# Patient Record
Sex: Male | Born: 1945 | Race: White | Hispanic: No | Marital: Married | Smoking: Former smoker
Health system: Southern US, Community
[De-identification: ages and names within clinical notes are randomized; demographics above are authoritative.]

## PROBLEM LIST (undated history)

## (undated) DIAGNOSIS — J189 Pneumonia, unspecified organism: Secondary | ICD-10-CM

## (undated) DIAGNOSIS — C3492 Malignant neoplasm of unspecified part of left bronchus or lung: Secondary | ICD-10-CM

## (undated) DIAGNOSIS — K219 Gastro-esophageal reflux disease without esophagitis: Secondary | ICD-10-CM

## (undated) DIAGNOSIS — H269 Unspecified cataract: Secondary | ICD-10-CM

## (undated) DIAGNOSIS — N189 Chronic kidney disease, unspecified: Secondary | ICD-10-CM

## (undated) DIAGNOSIS — D472 Monoclonal gammopathy: Secondary | ICD-10-CM

## (undated) DIAGNOSIS — I10 Essential (primary) hypertension: Secondary | ICD-10-CM

## (undated) DIAGNOSIS — N400 Enlarged prostate without lower urinary tract symptoms: Secondary | ICD-10-CM

## (undated) DIAGNOSIS — M51362 Other intervertebral disc degeneration, lumbar region with discogenic back pain and lower extremity pain: Secondary | ICD-10-CM

## (undated) DIAGNOSIS — D509 Iron deficiency anemia, unspecified: Secondary | ICD-10-CM

## (undated) DIAGNOSIS — J439 Emphysema, unspecified: Secondary | ICD-10-CM

## (undated) DIAGNOSIS — I739 Peripheral vascular disease, unspecified: Secondary | ICD-10-CM

## (undated) DIAGNOSIS — C3490 Malignant neoplasm of unspecified part of unspecified bronchus or lung: Secondary | ICD-10-CM

## (undated) DIAGNOSIS — Z8546 Personal history of malignant neoplasm of prostate: Secondary | ICD-10-CM

## (undated) DIAGNOSIS — C189 Malignant neoplasm of colon, unspecified: Secondary | ICD-10-CM

## (undated) DIAGNOSIS — M1990 Unspecified osteoarthritis, unspecified site: Secondary | ICD-10-CM

## (undated) DIAGNOSIS — F419 Anxiety disorder, unspecified: Secondary | ICD-10-CM

## (undated) DIAGNOSIS — R52 Pain, unspecified: Secondary | ICD-10-CM

## (undated) DIAGNOSIS — I700 Atherosclerosis of aorta: Secondary | ICD-10-CM

## (undated) DIAGNOSIS — I7121 Aneurysm of the ascending aorta, without rupture: Secondary | ICD-10-CM

## (undated) DIAGNOSIS — S22000A Wedge compression fracture of unspecified thoracic vertebra, initial encounter for closed fracture: Secondary | ICD-10-CM

## (undated) DIAGNOSIS — D126 Benign neoplasm of colon, unspecified: Secondary | ICD-10-CM

## (undated) DIAGNOSIS — I219 Acute myocardial infarction, unspecified: Secondary | ICD-10-CM

## (undated) DIAGNOSIS — I2510 Atherosclerotic heart disease of native coronary artery without angina pectoris: Secondary | ICD-10-CM

## (undated) DIAGNOSIS — E039 Hypothyroidism, unspecified: Secondary | ICD-10-CM

## (undated) DIAGNOSIS — E2740 Unspecified adrenocortical insufficiency: Secondary | ICD-10-CM

## (undated) DIAGNOSIS — F32A Depression, unspecified: Secondary | ICD-10-CM

## (undated) DIAGNOSIS — F329 Major depressive disorder, single episode, unspecified: Secondary | ICD-10-CM

## (undated) DIAGNOSIS — J449 Chronic obstructive pulmonary disease, unspecified: Secondary | ICD-10-CM

## (undated) DIAGNOSIS — R0600 Dyspnea, unspecified: Secondary | ICD-10-CM

## (undated) HISTORY — PX: BACK SURGERY: SHX140

## (undated) HISTORY — PX: KYPHOPLASTY: SHX5884

## (undated) HISTORY — PX: PORT-A-CATH REMOVAL: SHX5289

## (undated) HISTORY — PX: PROSTATE SURGERY: SHX751

## (undated) HISTORY — PX: OTHER SURGICAL HISTORY: SHX169

## (undated) HISTORY — PX: TONSILLECTOMY: SUR1361

## (undated) HISTORY — DX: Unspecified cataract: H26.9

## (undated) HISTORY — PX: CORONARY ANGIOPLASTY: SHX604

## (undated) HISTORY — PX: EYE SURGERY: SHX253

## (undated) HISTORY — PX: CATARACT EXTRACTION W/ INTRAOCULAR LENS  IMPLANT, BILATERAL: SHX1307

## (undated) HISTORY — PX: COLONOSCOPY: SHX174

---

## 2005-02-06 ENCOUNTER — Ambulatory Visit: Payer: Self-pay | Admitting: Ophthalmology

## 2006-01-01 ENCOUNTER — Ambulatory Visit: Payer: Self-pay | Admitting: Ophthalmology

## 2008-11-25 ENCOUNTER — Ambulatory Visit: Payer: Self-pay | Admitting: Unknown Physician Specialty

## 2011-03-06 ENCOUNTER — Ambulatory Visit: Payer: Self-pay | Admitting: Family Medicine

## 2011-09-27 ENCOUNTER — Inpatient Hospital Stay: Payer: Self-pay | Admitting: Internal Medicine

## 2011-09-27 LAB — COMPREHENSIVE METABOLIC PANEL
Alkaline Phosphatase: 60 U/L (ref 50–136)
Calcium, Total: 8.8 mg/dL (ref 8.5–10.1)
Chloride: 112 mmol/L — ABNORMAL HIGH (ref 98–107)
Co2: 25 mmol/L (ref 21–32)
Creatinine: 3.99 mg/dL — ABNORMAL HIGH (ref 0.60–1.30)
EGFR (African American): 17 — ABNORMAL LOW
EGFR (Non-African Amer.): 15 — ABNORMAL LOW
Osmolality: 299 (ref 275–301)
SGPT (ALT): 26 U/L
Sodium: 144 mmol/L (ref 136–145)
Total Protein: 7.8 g/dL (ref 6.4–8.2)

## 2011-09-27 LAB — URINALYSIS, COMPLETE
Bacteria: NONE SEEN
Blood: NEGATIVE
Ketone: NEGATIVE
Leukocyte Esterase: NEGATIVE
Nitrite: NEGATIVE
Ph: 5 (ref 4.5–8.0)
RBC,UR: <1 /HPF (ref 0–5)
Specific Gravity: 1.008 (ref 1.003–1.030)

## 2011-09-27 LAB — CBC
HGB: 14.2 g/dL (ref 13.0–18.0)
MCH: 30.7 pg (ref 26.0–34.0)
Platelet: 198 10*3/uL (ref 150–440)
RBC: 4.62 10*6/uL (ref 4.40–5.90)
WBC: 7.8 10*3/uL (ref 3.8–10.6)

## 2011-09-28 LAB — CREATININE, SERUM
EGFR (African American): 22 — ABNORMAL LOW
EGFR (Non-African Amer.): 19 — ABNORMAL LOW

## 2011-12-19 ENCOUNTER — Ambulatory Visit: Payer: Self-pay | Admitting: Urology

## 2011-12-26 ENCOUNTER — Ambulatory Visit: Payer: Self-pay | Admitting: Urology

## 2011-12-27 LAB — BASIC METABOLIC PANEL
Calcium, Total: 8.1 mg/dL — ABNORMAL LOW (ref 8.5–10.1)
Chloride: 113 mmol/L — ABNORMAL HIGH (ref 98–107)
Creatinine: 1.43 mg/dL — ABNORMAL HIGH (ref 0.60–1.30)
EGFR (African American): 60 — ABNORMAL LOW
EGFR (Non-African Amer.): 51 — ABNORMAL LOW
Glucose: 99 mg/dL (ref 65–99)

## 2011-12-27 LAB — PATHOLOGY REPORT

## 2011-12-27 LAB — CBC WITH DIFFERENTIAL/PLATELET
Basophil #: 0.1 10*3/uL (ref 0.0–0.1)
Basophil %: 0.9 %
Eosinophil #: 0.5 10*3/uL (ref 0.0–0.7)
HCT: 40.2 % (ref 40.0–52.0)
MCV: 93 fL (ref 80–100)
Monocyte #: 1 x10 3/mm (ref 0.2–1.0)
Neutrophil %: 65.1 %
Platelet: 171 10*3/uL (ref 150–440)
RBC: 4.34 10*6/uL — ABNORMAL LOW (ref 4.40–5.90)
RDW: 16.1 % — ABNORMAL HIGH (ref 11.5–14.5)
WBC: 11.4 10*3/uL — ABNORMAL HIGH (ref 3.8–10.6)

## 2013-05-29 DIAGNOSIS — C189 Malignant neoplasm of colon, unspecified: Secondary | ICD-10-CM

## 2013-05-29 HISTORY — DX: Malignant neoplasm of colon, unspecified: C18.9

## 2014-02-24 DIAGNOSIS — D126 Benign neoplasm of colon, unspecified: Secondary | ICD-10-CM | POA: Insufficient documentation

## 2014-04-15 ENCOUNTER — Ambulatory Visit: Payer: Self-pay | Admitting: Unknown Physician Specialty

## 2014-09-15 NOTE — Op Note (Signed)
PATIENT NAME:  Marc Gray, Marc Gray MR#:  [id number] DATE OF BIRTH:  1945/10/12  DATE OF PROCEDURE:  12/26/2011  PREOPERATIVE DIAGNOSES:  1. Benign prostatic hypertrophy.  2. Urinary retention.   POSTOPERATIVE DIAGNOSES:  1. Benign prostatic hypertrophy.  2. Urinary retention.   PROCEDURE: Transurethral resection of the prostate.   SURGEON: Edrick Oh, M.D.   ANESTHESIA: Laryngeal mask airway anesthesia.   INDICATIONS: The patient is a 69 year old gentleman who presented with large volume urinary retention and bilateral hydronephrosis with renal failure. He has failed multiple voiding trials. He has significant prostatic hypertrophy with visual obstruction. He presents for transurethral resection of the prostate.   DESCRIPTION OF PROCEDURE: After informed consent was obtained, the patient was taken to the operating room and placed in the dorsal lithotomy position under laryngeal mask airway anesthesia. The patient was then prepped and draped in the usual standard fashion. The saline resectoscope sheath was placed utilizing the visual obturator. Dilation of the urethral meatus was required, likely related to some stricture from the indwelling Foley catheter. This was calibrated to 30 [place] to allow sheath placement. No significant bleeding was encountered. Upon entering the prostatic fossa, the verumontanum was easily identified. Prominent bilobar hypertrophy was visualized with complete visual obstruction. The bladder demonstrated inflammatory changes on the bladder base and posterior bladder wall consistent with the indwelling Foley catheter. Bilateral ureteral orifices were well visualized with no lesions noted. No definitive diverticula were appreciated within the bladder. The resectoscope loop was then placed through the sheath. Resection was then begun at the level of the bladder neck, taken back to the level of the verumontanum. A large amount of lateral lobe tissue was present, including  some anterior tissue. This was resected utilizing the loop. Once the bulk of the resection was completed and small areas of bleeding were cauterized, the chips were irrigated from the bladder. These were collected and will be sent for pathology analysis. The button electrode was then utilized to facilitate additional tissue resection. Significant more lateral tissue became more evident as additional tissue was resected. This was especially near the apical region. The verumontanum was easily identified throughout the entire resection. Care was taken to avoid any resection beyond the level of the verumontanum. A significant amount of lateral lobe and anterior tissue was resected utilizing the button electrode. Several areas of bleeding were identified and cauterized utilizing the button and electrocautery. Overall no significant bleeding was encountered. The prostate fossa was noted to be open. The ureteral orifices were noted to be an adequate distance from the bladder neck and resection. A few remaining TUR chips were present within the bladder. These were irrigated free. Once the bladder was cleared, the resectoscope sheath was removed. A 22 French Foley catheter was placed utilizing a catheter guide. It was placed to gravity drainage and to continuous bladder irrigation without difficulty. The return was clear. The patient was returned to the supine position and awakened from laryngeal mask airway anesthesia. He was taken to the recovery room in stable condition. There were no problems or complications. The patient tolerated the procedure well.  Estimated blood loss was minimal.   ____________________________ Denice Bors. Jacqlyn Larsen, MD bsc:bjt D: 12/26/2011 09:59:47 ET T: 12/26/2011 12:28:33 ET JOB#: [id number]  cc: Denice Bors. Jacqlyn Larsen, MD, <Dictator> Denice Bors Keigo Whalley MD ELECTRONICALLY SIGNED 12/26/2011 13:11

## 2014-09-20 NOTE — Consult Note (Signed)
PATIENT NAME:  Marc Gray, Marc Gray MR#:  [id number] DATE OF BIRTH:  05/17/1946  DATE OF CONSULTATION:  09/28/2011  REFERRING PHYSICIAN:   CONSULTING PHYSICIAN:  Denice Bors. Jacqlyn Larsen, MD  HISTORY: Marc Gray is a 69 year old gentleman with a long-standing history of prostatic hypertrophy and obstructive-type symptoms. He states that he has had difficulty with urination for at least several years. Several weeks prior he developed episodes of nighttime incontinence consistent with overflow. He states he has just felt poorly over the past number of weeks. He presented to the emergency room yesterday. He was found to be in urinary retention with a serum creatinine of 3.99. An ultrasound demonstrated bilateral hydronephrosis as well as a large distended bladder. A Foley catheter was placed with approximately 4 liters drained from the urinary bladder. He has since had some improvement in his serum creatinine with reduction to 3.3 after catheter placement. This will likely continue to improve over the next few weeks. He was also having problems with elevated blood pressure. His blood pressure medication was recently increased. He now demonstrates significant reduction in his blood pressure after bladder decompression. Monitoring for resultant hypotension will be needed. He states he is feeling much better with the catheter drainage. The finding of 4 liters in the bladder does raise significant concern over permanent bladder damage related to over distention. He will likely have continued improvement in his renal function, although there will be some degree of permanent impairment. There is a fairly high likelihood that he may require chronic catheterization or at minimum self intermittent catheterization. He will also likely need a TURP. Further evaluation with cystoscopy to determine if a bladder diverticulum or other findings were present has also been discussed with the patient. He understands the situation. He  denies any known family history of prostate cancer. He denies any known family history of significant prostatic hypertrophy or surgical intervention. He denies any other significant prior urological history.   PAST MEDICAL HISTORY:  1. Recent onset hypertension. 2. Prostatic hypertrophy, as stated above.   PAST SURGICAL HISTORY: Noncontributory.   ALLERGIES: No known drug allergies.   MEDICATIONS ON ADMISSION: None.   PHYSICAL EXAMINATION:   VITAL SIGNS: Blood pressure 119/77, heart rate 66, respiratory rate 18, and temperature 98.5.   HEENT: Examination is within normal limits.   CHEST: Clear to auscultation bilaterally.   CARDIOVASCULAR: Regular rate and rhythm.   ABDOMEN: Soft, nontender, and nondistended. Bladder nonpalpable. No appreciable CVA tenderness.   GU: External genitalia within normal limits Foley catheter in place draining blood-tinged urine.   EXTREMITIES: Free range of motion x4.   NEUROLOGIC: Motor and sensory grossly intact.   ASSESSMENT:  1. Urinary retention with acute renal failure.  2. Benign prostatic hypertrophy.  3. Bilateral hydronephrosis.   RECOMMENDATIONS: The patient will need a Foley catheter for a minimum of 10 to 14 days. He should be started on Flomax 0.4 mg with breakfast daily. He should follow up in the office in 10 to 14 days for catheter removal and voiding trial. He will need a subsequent cystoscopy for further evaluation of the bladder for prostate and possible bladder diverticulum. As long as there is continued improvement in renal function status, follow-up upper tract studies is not absolutely          indicated. He will have some blood in the urine related to the overdistention. Avoid aspirin and other blood thinners at this time. If there are any further questions, please free to contact us.  ____________________________ Aaron Edelman  Bjorn Loser, MD bsc:slb D: 09/28/2011 07:51:57 ET T: 09/28/2011 11:20:39  ET JOB#: [id number]  cc: Denice Bors. Jacqlyn Larsen, MD, <Dictator> Denice Bors Zeus Marquis MD ELECTRONICALLY SIGNED 10/09/2011 8:15

## 2014-09-20 NOTE — Discharge Summary (Signed)
PATIENT NAME:  Marc Gray, EICHER MR#:  [id number] DATE OF BIRTH:  05-09-1946  DATE OF ADMISSION:  09/27/2011 DATE OF DISCHARGE:  09/28/2011  DISCHARGE DIAGNOSES:  1. Acute on chronic kidney disease with unknown baseline creatinine, likely due to long-standing urinary retention, now relieved status post Foley catheterization, creatinine improving status post catheter.  2. Urinary retention likely due to benign prostatic hypertrophy, now has an indwelling Foley which he will need for two weeks and then outpatient urologic evaluation.  3. Benign prostatic hypertrophy, on Flomax.  4. Malignant hypertension, now resolved, likely due to urinary retention, stopped all blood pressure medication now.   SECONDARY DIAGNOSIS: Hypertension.   CONSULTANT: Urology, Dr. Edrick Oh.   LABORATORY, DIAGNOSTIC AND RADIOLOGICAL DATA: Bilateral kidney ultrasound on 05/01 showed moderate bilateral hydronephrosis. Urinalysis on admission was negative.   HISTORY AND SHORT HOSPITAL COURSE: Patient is a 69 year old male with a known history of hypertension was admitted for acute renal failure thought to be postobstructive in nature due to underlying benign prostatic hypertrophy and long-standing urinary retention. Patient's creatinine on the day of admission was 3.99. Please see Dr. Trena Platt dictated history and physical for further details. Foley catheter was inserted and patient drained more than 4 liters of urine while right there while in the Emergency Department and was continued to have good urine output via Foley. His creatinine also started coming down with a value of 3.3 while on the next day urological consultation was obtained with Dr. Edrick Oh who agreed with the management. Recommended Flomax for treatment of benign prostatic hypertrophy. Patient was discharged home in stable condition as his renal function was getting better and was expected to get better with Foley indwelling for now. He was discharged home  in stable condition.   PHYSICAL EXAMINATION: VITAL SIGNS: On the date of discharge his vital signs are as follows: Temperature 97.3, heart rate 62 per minute, respirations 20 per minute, blood pressure 127/74 mmHg. He was saturating 94% on room air. Pertinent Physical Examination: CARDIOVASCULAR: S1, S2 normal. No murmurs, rubs, or gallop. LUNGS: Clear to auscultation bilaterally. No wheezes, rales, rhonchi, crepitation. ABDOMEN: Soft, benign. NEUROLOGIC: Nonfocal examination. All other physical examination remained at the baseline.   DISCHARGE MEDICATIONS: Flomax 0.4 mg p.o. daily.   DISCHARGE DIET: Low sodium.   DISCHARGE ACTIVITY: As tolerated.   DISCHARGE INSTRUCTIONS AND FOLLOW UP:  1. Patient was instructed to follow up with his primary care physician, Dr. [name], in 1 to 2 weeks.  2. He will need follow up with Dr. Edrick Oh as scheduled on 05/15 at 9:00 a.m. for voiding trial. He was instructed to keep the Foley indwelling for now until he sees Dr. Edrick Oh as an outpatient. He was set up to get home health nursing for Foley care and other needs.  3. He will need basic metabolic panel check in one week with results forwarded to primary care physician for renal function evaluation considering his renal failure which is expected to improve with indwelling Foley.   TOTAL TIME DISCHARGING THIS PATIENT: 45 minutes.   ____________________________ Lucina Mellow. Manuella Ghazi, MD vss:cms D: 09/29/2011 09:51:40 ET T: 09/29/2011 13:43:22 ET JOB#: [id number]  cc: Justan Gaede S. Manuella Ghazi, MD, <Dictator> Irven Easterly. Kary Kos, MD Denice Bors. Jacqlyn Larsen, MD Remer Macho MD ELECTRONICALLY SIGNED 10/01/2011 13:20

## 2014-09-20 NOTE — Consult Note (Signed)
Patient Seen, Chart Review, Films Reviewed, Note Dictated urinary retention,  acute renal failure, benign prostatic hypertrophy The findings are consistent with long-standing urinary retention.  The bladder volume of 4 L raises significant concern over the degree of permanent damage in regards to bladder function.  He will need the Foley catheter for a minimum of 10-14 days.  He will need to be on Flomax 0.4 mg with breakfast.  He should followup in the office in 10-14 days for catheter removal and voiding trial.  There is already a trend down in his serum creatinine.  There will likely be some degree of permanent renal impairment as a result of long-standing obstruction.  There is a fairly significant chance that he will be an permanent urinary retention.  If there is no evidence of bladder diverticulum, he will likely need a TURP.  There is still a significant chance that he may not void adequately spontaneously even after a TURP.  He may need chronic Foley catheter or at minimum self intermittent catheterization.  This has been discussed in detail with the patient.  No further urological intervention is indicated at present.  He may need less or possibly no blood pressure medication now that the obstruction is relieved.  He does have some risk of resultant hypotension.  Please contact us if there are any further questions.  Electronic Signatures: Murrell Redden (MD)  (Signed on 02-May-13 07:44)  Authored  Last Updated: 02-May-13 07:44 by Murrell Redden (MD)

## 2014-09-20 NOTE — H&P (Signed)
PATIENT NAME:  Marc Gray, Marc Gray MR#:  [id number] DATE OF BIRTH:  1945/07/31  DATE OF ADMISSION:  09/27/2011  PRIMARY CARE PHYSICIAN:  Dr. Kary Kos.  REQUESTING PHYSICIAN: Dr. Renard Hamper.   CHIEF COMPLAINT: Elevated kidney function.   HISTORY OF PRESENT ILLNESS: The patient is a 69 year old male with a known history of hypertension who is being admitted for acute renal failure. The patient has been having bilateral flank pain for the last week or so. He has been taking ibuprofen at least 4 to 5 tablets daily. He also had some increased kidney function since January of this year which is being followed by his primary care physician. He had a repeat evaluation yesterday as a regular follow-up and his primary care physician's office called him to come to the Emergency Department. His creatinine was found to be 3.6. The patient reports urge and not able to urinate for the last couple of months until last 2 to 3 weeks when he started having a lot more peeing with at times good flow. He does report having some urinary difficulties since he was cleared. He denies any other symptoms at this time. He also reports having increased blood pressure since January and his primary care physician is trying to get it under control with different medication.   PAST MEDICAL HISTORY: Hypertension.   MEDICATIONS AT HOME:  1. Amlodipine 5 mg p.o. daily.  2. Ibuprofen 4 to 5 tablets daily for the last week and was taking intermittently before that.  3. Aspirin 325 mg daily.   ALLERGIES: No known drug allergies.   SOCIAL HISTORY: Smokes 1-1/2 to two packs of cigarettes daily for the last 50 years. Occasional alcohol. He works as a Publishing rights manager.   FAMILY HISTORY: Father with diabetes. Mother with diet-controlled diabetes.   REVIEW OF SYSTEMS: CONSTITUTIONAL: No fever, fatigue, or weakness. EYES: No blurred or double vision. ENT: No tinnitus or ear pain. RESPIRATORY: No cough, wheezing, or hemoptysis. CARDIOVASCULAR: No chest  pain, orthopnea or edema. GASTROINTESTINAL: No nausea, vomiting, or diarrhea. GENITOURINARY: Positive for urinary urgency and frequency at times. ENDOCRINE: No polyuria or nocturia. HEMATOLOGY: No anemia or bruising. SKIN: No rash or lesion. MUSCULOSKELETAL: No arthritis or muscle cramps. He does get some left lower extremity edema which he claims is due to valve dysfunction in the lower extremity on the left side.  NEUROLOGIC: No tingling or numbness. Positive for headache. PSYCHIATRIC: No history of anxiety or depression.   PHYSICAL EXAMINATION:  VITAL SIGNS: Temperature 95.6, heart rate 65 per minute, respirations 20 per minute, blood pressure of 208/103. He was saturating 98% on room air.   GENERAL: The patient is a 69 year old male lying in the bed comfortably without any acute distress.   EYES: Pupils equal, round, reactive to light and accommodation. No scleral icterus. Extraocular muscles intact.   HEENT: Head atraumatic, normocephalic. Oropharynx and nasopharynx clear.   NECK: Supple. No jugular venous distention. No thyroid enlargement or tenderness.   LUNGS: Clear to auscultation bilaterally. No wheezes, rales, rhonchi, or crepitation.   HEART: S1, S2. No murmurs, rubs, or gallop.   ABDOMEN: Soft, nontender, nondistended. Bowel sounds present. No organomegaly or mass.   EXTREMITIES: No pedal edema on the right lower extremity. 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    NEUROLOGIC: Nonfocal examination. Cranial nerves II through XII intact. Muscle strength 5/5 in extremities. Sensation  intact.  PSYCHIATRIC: The patient is oriented to time, place, and person x3.   SKIN: No obvious rash, lesion, or ulcer.   GENITOURINARY: The patient has a Foley catheter in place now.   LABORATORY, RADIOLOGICAL AND DIAGNOSTIC DATA: Normal CBC. Normal liver function tests.  Normal BMP except BUN of 47, creatinine 2.99, chloride of 112. Negative urinalysis. Bilateral kidney ultrasound while in the Emergency Room showed moderate bilateral hydronephrosis. His labs from Dr. Barbarann Ehlers office from yesterday showed creatinine of 3.6.   IMPRESSION AND PLAN:  1. Acute renal failure, likely postobstructive in nature due to underlying benign prostatic hyperplasia, now has Foley catheterization indwelling and has already drained more than 4 liters while in the Emergency Department. I discussed the case with urologist, Dr. Jacqlyn Larsen who will see the patient in the morning. Will obtain consult at this time. The patient was also using nonsterile anti-inflammatory including aspirin and ibuprofen. We will stop that. This could be making his kidney functions worse.  2. Accelerated hypertension likely due to ongoing urinary retention. Now blood pressure is improving status post Foley catheterization and draining urine. Will start Norvasc at a higher dose to get blood pressure under better control.  3. Bilateral hydronephrosis, likely due to benign prostatic hypertrophy. We will consult urology. Already has indwelling Foley. This should improve.  4. Tobacco abuse. The patient was counseled for about three minutes. He is agreeable with nicotine patch while in the hospital. We will order that and provide smoking cessation material.  5. CODE STATUS: FULL CODE.   The patient stated that he already has an appointment in the coming two weeks with one of the outpatient neurology that his primary care physician asked him to do.   TOTAL TIME TAKING CARE OF THIS PATIENT: 55 minutes.    ____________________________ Lucina Mellow. Manuella Ghazi, MD vss:ap D: 09/27/2011 20:07:09 ET T: 09/28/2011 09:46:44 ET JOB#: [id number]  cc: Arrie Zuercher S. Manuella Ghazi, MD, <Dictator> Irven Easterly. Kary Kos, MD Denice Bors. Jacqlyn Larsen, MD Remer Macho MD ELECTRONICALLY SIGNED 09/29/2011 12:31

## 2014-09-21 LAB — SURGICAL PATHOLOGY

## 2016-12-01 DIAGNOSIS — Z91038 Other insect allergy status: Secondary | ICD-10-CM | POA: Diagnosis not present

## 2016-12-01 DIAGNOSIS — R220 Localized swelling, mass and lump, head: Secondary | ICD-10-CM | POA: Diagnosis not present

## 2016-12-13 DIAGNOSIS — R252 Cramp and spasm: Secondary | ICD-10-CM | POA: Diagnosis not present

## 2016-12-13 DIAGNOSIS — R5381 Other malaise: Secondary | ICD-10-CM | POA: Diagnosis not present

## 2016-12-13 DIAGNOSIS — R590 Localized enlarged lymph nodes: Secondary | ICD-10-CM | POA: Diagnosis not present

## 2016-12-13 DIAGNOSIS — Z23 Encounter for immunization: Secondary | ICD-10-CM | POA: Diagnosis not present

## 2016-12-13 DIAGNOSIS — R5383 Other fatigue: Secondary | ICD-10-CM | POA: Diagnosis not present

## 2016-12-13 DIAGNOSIS — Z125 Encounter for screening for malignant neoplasm of prostate: Secondary | ICD-10-CM | POA: Diagnosis not present

## 2016-12-13 DIAGNOSIS — Z0000 Encounter for general adult medical examination without abnormal findings: Secondary | ICD-10-CM | POA: Diagnosis not present

## 2016-12-13 DIAGNOSIS — Z720 Tobacco use: Secondary | ICD-10-CM | POA: Diagnosis not present

## 2016-12-13 DIAGNOSIS — R0789 Other chest pain: Secondary | ICD-10-CM | POA: Diagnosis not present

## 2016-12-15 ENCOUNTER — Other Ambulatory Visit: Payer: Self-pay | Admitting: Family Medicine

## 2016-12-15 DIAGNOSIS — R221 Localized swelling, mass and lump, neck: Secondary | ICD-10-CM

## 2017-01-03 ENCOUNTER — Ambulatory Visit: Admission: RE | Admit: 2017-01-03 | Payer: PPO | Source: Ambulatory Visit

## 2017-01-17 ENCOUNTER — Ambulatory Visit: Admission: RE | Admit: 2017-01-17 | Payer: PPO | Source: Ambulatory Visit

## 2017-01-24 ENCOUNTER — Ambulatory Visit
Admission: RE | Admit: 2017-01-24 | Discharge: 2017-01-24 | Disposition: A | Discharge status: Home / Self Care | Payer: PPO | Source: Ambulatory Visit | Attending: Family Medicine | Admitting: Family Medicine

## 2017-01-24 DIAGNOSIS — J439 Emphysema, unspecified: Secondary | ICD-10-CM | POA: Diagnosis not present

## 2017-01-24 DIAGNOSIS — I700 Atherosclerosis of aorta: Secondary | ICD-10-CM | POA: Diagnosis not present

## 2017-01-24 DIAGNOSIS — R221 Localized swelling, mass and lump, neck: Secondary | ICD-10-CM | POA: Insufficient documentation

## 2017-01-24 HISTORY — DX: Malignant neoplasm of colon, unspecified: C18.9

## 2017-01-24 MED ORDER — IOPAMIDOL (ISOVUE-300) INJECTION 61%
75.0000 mL | Freq: Once | INTRAVENOUS | Status: AC | PRN
  Administered 2017-01-24: 75 mL via INTRAVENOUS

## 2017-02-02 ENCOUNTER — Inpatient Hospital Stay: Payer: PPO | Attending: Oncology | Admitting: Oncology

## 2017-02-02 ENCOUNTER — Encounter: Payer: Self-pay | Admitting: Oncology

## 2017-02-02 ENCOUNTER — Inpatient Hospital Stay: Payer: PPO

## 2017-02-02 VITALS — BP 126/76 | HR 66 | Temp 97.5°F | Resp 18 | Ht 72.84 in | Wt 175.2 lb

## 2017-02-02 DIAGNOSIS — Z7982 Long term (current) use of aspirin: Secondary | ICD-10-CM

## 2017-02-02 DIAGNOSIS — R918 Other nonspecific abnormal finding of lung field: Secondary | ICD-10-CM | POA: Diagnosis not present

## 2017-02-02 DIAGNOSIS — R221 Localized swelling, mass and lump, neck: Secondary | ICD-10-CM | POA: Diagnosis not present

## 2017-02-02 DIAGNOSIS — Z85038 Personal history of other malignant neoplasm of large intestine: Secondary | ICD-10-CM | POA: Diagnosis not present

## 2017-02-02 DIAGNOSIS — I700 Atherosclerosis of aorta: Secondary | ICD-10-CM | POA: Diagnosis not present

## 2017-02-02 DIAGNOSIS — I6529 Occlusion and stenosis of unspecified carotid artery: Secondary | ICD-10-CM | POA: Insufficient documentation

## 2017-02-02 DIAGNOSIS — R590 Localized enlarged lymph nodes: Secondary | ICD-10-CM | POA: Diagnosis not present

## 2017-02-02 DIAGNOSIS — C770 Secondary and unspecified malignant neoplasm of lymph nodes of head, face and neck: Secondary | ICD-10-CM | POA: Insufficient documentation

## 2017-02-02 DIAGNOSIS — C3492 Malignant neoplasm of unspecified part of left bronchus or lung: Secondary | ICD-10-CM | POA: Diagnosis not present

## 2017-02-02 DIAGNOSIS — J432 Centrilobular emphysema: Secondary | ICD-10-CM | POA: Diagnosis not present

## 2017-02-02 DIAGNOSIS — Z79899 Other long term (current) drug therapy: Secondary | ICD-10-CM | POA: Insufficient documentation

## 2017-02-02 DIAGNOSIS — R0789 Other chest pain: Secondary | ICD-10-CM | POA: Insufficient documentation

## 2017-02-02 DIAGNOSIS — R05 Cough: Secondary | ICD-10-CM | POA: Diagnosis not present

## 2017-02-02 DIAGNOSIS — N400 Enlarged prostate without lower urinary tract symptoms: Secondary | ICD-10-CM | POA: Insufficient documentation

## 2017-02-02 DIAGNOSIS — Z716 Tobacco abuse counseling: Secondary | ICD-10-CM

## 2017-02-02 DIAGNOSIS — F17210 Nicotine dependence, cigarettes, uncomplicated: Secondary | ICD-10-CM | POA: Diagnosis not present

## 2017-02-02 DIAGNOSIS — Z803 Family history of malignant neoplasm of breast: Secondary | ICD-10-CM | POA: Insufficient documentation

## 2017-02-02 LAB — CBC WITH DIFFERENTIAL/PLATELET
Basophils Absolute: 0.2 10*3/uL — ABNORMAL HIGH (ref 0–0.1)
Basophils Relative: 2 %
Eosinophils Absolute: 0.6 10*3/uL (ref 0–0.7)
Eosinophils Relative: 6 %
HEMATOCRIT: 41.5 % (ref 40.0–52.0)
HEMOGLOBIN: 14.3 g/dL (ref 13.0–18.0)
LYMPHS ABS: 1.1 10*3/uL (ref 1.0–3.6)
LYMPHS PCT: 12 %
MCH: 31.5 pg (ref 26.0–34.0)
MCHC: 34.3 g/dL (ref 32.0–36.0)
MCV: 91.8 fL (ref 80.0–100.0)
MONO ABS: 1 10*3/uL (ref 0.2–1.0)
MONOS PCT: 11 %
NEUTROS ABS: 6 10*3/uL (ref 1.4–6.5)
Neutrophils Relative %: 69 %
Platelets: 220 10*3/uL (ref 150–440)
RBC: 4.53 MIL/uL (ref 4.40–5.90)
RDW: 14.7 % — AB (ref 11.5–14.5)
WBC: 8.8 10*3/uL (ref 3.8–10.6)

## 2017-02-02 LAB — COMPREHENSIVE METABOLIC PANEL
ALK PHOS: 55 U/L (ref 38–126)
ALT: 19 U/L (ref 17–63)
ANION GAP: 9 (ref 5–15)
AST: 17 U/L (ref 15–41)
Albumin: 3.7 g/dL (ref 3.5–5.0)
BILIRUBIN TOTAL: 0.8 mg/dL (ref 0.3–1.2)
BUN: 22 mg/dL — ABNORMAL HIGH (ref 6–20)
CALCIUM: 9.1 mg/dL (ref 8.9–10.3)
CO2: 23 mmol/L (ref 22–32)
Chloride: 103 mmol/L (ref 101–111)
Creatinine, Ser: 1.34 mg/dL — ABNORMAL HIGH (ref 0.61–1.24)
GFR calc Af Amer: >60 mL/min (ref >60)
GFR, EST NON AFRICAN AMERICAN: 52 mL/min — AB (ref >60)
Glucose, Bld: 102 mg/dL — ABNORMAL HIGH (ref 65–99)
POTASSIUM: 4.3 mmol/L (ref 3.5–5.1)
Sodium: 135 mmol/L (ref 135–145)
TOTAL PROTEIN: 7.2 g/dL (ref 6.5–8.1)

## 2017-02-02 LAB — TSH: TSH: 3.455 u[IU]/mL (ref 0.350–4.500)

## 2017-02-02 NOTE — Progress Notes (Addendum)
Hematology/Oncology Consult note [facility] [phone] [phone]  CONSULT NOTE Patient Care Team: [name], MD as PCP - General (Family Medicine)  CHIEF COMPLAINTS/PURPOSE OF CONSULTATION:     HISTORY OF PRESENTING ILLNESS:  Marc Gray 71 y.o.  male who was referred by Dr.Hedrick Jeneen Rinks to see me for evaluation of neck mass.  Patient has felt a neck mass for about 7-8 months and lately also atypical chest pain and he went to see primary care physician Dr.Hedrick. CT neck was done which showed multiple cervical/supraclavical lymphadenopathy as well as left upper lobe mass.   Patient has 53 pack year smoking history and currently daily smoker    ROS:  Review of Systems  Constitutional: Negative.   HENT:  Negative.   Eyes: Negative.   Respiratory: Positive for cough.   Cardiovascular: Positive for chest pain.  Gastrointestinal: Negative.   Endocrine: Negative.   Genitourinary: Negative.    Musculoskeletal: Negative.   Skin: Negative.   Neurological: Negative.   Hematological: Negative.   Psychiatric/Behavioral: Negative.     MEDICAL HISTORY:  Past Medical History:  Diagnosis Date  . Cataract   . Colon cancer ([place]) 2015   Pt states during his colonoscopy he had cancerous polyps removed.     SURGICAL HISTORY: No past surgical history on file.  SOCIAL HISTORY: Social History   Social History  . Marital status: Married    Spouse name: N/A  . Number of children: N/A  . Years of education: N/A   Occupational History  . Not on file.   Social History Main Topics  . Smoking status: Current Some Day Smoker    Packs/day: 1.00    Years: 53.00    Types: Cigarettes  . Smokeless tobacco: Never Used  . Alcohol use Not on file  . Drug use: Unknown  . Sexual activity: Not on file   Other Topics Concern  . Not on file   Social History Narrative  . No narrative on file    FAMILY HISTORY: Family  History  Problem Relation Age of Onset  . Breast cancer Maternal Grandmother     ALLERGIES:  has No Known Allergies.  MEDICATIONS:  Current Outpatient Prescriptions  Medication Sig Dispense Refill  . aspirin 325 MG tablet Take 325 mg by mouth daily.    . Chlorpheniramine-Phenylephrine (SINUS & ALLERGY PO) Take by mouth daily as needed.    . Multiple Vitamin (MULTI-VITAMINS) TABS Take by mouth.     No current facility-administered medications for this visit.       [name]  PHYSICAL EXAMINATION: ECOG PERFORMANCE STATUS: 0 - Asymptomatic Vitals:   02/02/17 1505  BP: 126/76  Pulse: 66  Resp: 18  Temp: (!) 97.5 F (36.4 C)   Filed Weights   02/02/17 1505  Weight: 175 lb 3.2 oz (79.5 kg)    GENERAL: Well-nourished well-developed; Alert, no distress and comfortable.  EYES: no pallor or icterus OROPHARYNX: no thrush or ulceration; good dentition  NECK: supple, no masses felt LYMPH: left supraclavicular lymph adenopathy, multiple other cervical lymph nodes.  LUNGS: clear to auscultation and  No wheeze or crackles HEART/CVS: regular rate & rhythm and no murmurs; No lower extremity edema ABDOMEN: abdomen soft, non-tender and normal bowel sounds Musculoskeletal:no cyanosis of digits and no clubbing  PSYCH: alert & oriented x 3  NEURO: no focal motor/sensory deficits SKIN:  no rashes or significant lesions  LABORATORY DATA:  I have reviewed the data as listed Lab Results  Component  Value Date   WBC 11.4 (H) 12/27/2011   HGB 13.8 12/27/2011   HCT 40.2 12/27/2011   MCV 93 12/27/2011   PLT 171 12/27/2011   No results for input(s): NA, K, CL, CO2, GLUCOSE, BUN, CREATININE, CALCIUM, GFRNONAA, GFRAA, PROT, ALBUMIN, AST, ALT, ALKPHOS, BILITOT, BILIDIR, IBILI in the last 8760 hours.  RADIOGRAPHIC STUDIES: I have personally reviewed the radiological images as listed and agreed with the findings in the report. Ct Soft Tissue Neck W Contrast  Result Date: 01/24/2017 CLINICAL DATA:   Left supraclavicular mass for 9 months. EXAM: CT NECK WITH CONTRAST TECHNIQUE: Multidetector CT imaging of the neck was performed using the standard protocol following the bolus administration of intravenous contrast. CONTRAST:  25mL ISOVUE-300 IOPAMIDOL (ISOVUE-300) INJECTION 61% COMPARISON:  None. FINDINGS: Pharynx and larynx: Symmetric pharyngeal soft tissues without evidence of mass. No fluid collection or significant inflammatory changes in the parapharyngeal or retropharyngeal spaces. Unremarkable larynx. Salivary glands: No inflammation, mass, or stone. Thyroid: Unremarkable. Lymph nodes: Multiple enlarged, rounded left supraclavicular lymph nodes measure up to 12 mm in short axis. An abnormal left level V lymph node more superiorly in the left lateral neck measures 9 mm. An 8 mm short axis left level III lymph node has an abnormal rounded configuration. An enlarged left level III lymph node more inferiorly measures 13 mm, while left level IV lymph nodes measure 10 mm. There is a 10 mm lymph node in the right tracheoesophageal groove near the thoracic inlet. A low right level III lymph node measures 8 mm, and there is a 6 mm right level III lymph node more superiorly. Vascular: Major vascular structures of the neck are patent. Mild right greater than left cervical carotid artery atherosclerosis. Mild thoracic aortic atherosclerosis. Limited intracranial: Unremarkable. Visualized orbits: Bilateral cataract extraction. Mastoids and visualized paranasal sinuses: Partially visualized right frontal sinus opacification. Mild right ethmoid air cell mucosal thickening. Clear mastoid air cells. Cerumen in the left EAC. Skeleton: No suspicious lytic or blastic osseous lesion. Moderate lower cervical disc degeneration, greatest at C6-7. Upper chest: Multiple enlarged mediastinal lymph nodes including a 2.1 cm low right paratracheal node and a 1.5 cm prevascular node. Large left upper lobe mass extending to the pleural  surface in the anterior/apical left upper lobe. The mass measures greater than 10 cm in craniocaudal length. The mass extends inferiorly to the left hilum where there is soft tissue encasing the hilar vessels as well as extending posteriorly into the superior segment of the left lower lobe. The hilar component was incompletely imaged. There is centrilobular emphysema. Other: 1.5 cm subcutaneous low-density lesion in the posterior midline of the lower neck, likely a sebaceous cyst. IMPRESSION: 1. Partially visualized large left upper lobe lung mass with mediastinal and left greater than right lower neck/supraclavicular lymphadenopathy consistent with metastatic lung cancer. 2. Aortic Atherosclerosis (ICD10-I70.0) and Emphysema (ICD10-J43.9). These results will be called to the ordering clinician or representative by the Radiologist Assistant, and communication documented in the PACS or zVision Dashboard. Electronically Signed   By: Logan Bores M.D.   On: 01/24/2017 11:06    ASSESSMENT & PLAN:  1. Lung mass   2. Cervical lymphadenopathy   3. Tobacco abuse counseling    I had a lengthy discussion with patient about image finding which is very concerning for primary lung cancer  Will obtain PET scan, MRI brain.  Refer to surgery for left supraclavicular lymph node biopsy.  Surgery evaluation for biopsy, possible core needle biopsy of left supraclavicular node. #  smoke cessation is discussed with patient in detail.  Will discuss his case on tumor board  Patient will be out of town next week and prefer to proceed work up after he returns.   All questions were answered. The patient knows to call the clinic with any problems questions or concerns.  Return of visit:  Thank you for this kind referral and the opportunity to participate in the care of this patient. A copy of today's note is routed to referring provider Dr.Hedrick.    [name], MD, PhD Hematology Oncology [facility] Pager- [phone] 02/02/2017

## 2017-02-02 NOTE — Progress Notes (Signed)
Patient say his PCP for left side neck nodules and left side chest/back pain.  The left side pain is in episodes and can start in the back that radiates to the chest or start in the chest that radiates to the back.  His PCP ordered a CT and it did show an abnormality.

## 2017-02-03 ENCOUNTER — Encounter: Payer: Self-pay | Admitting: Oncology

## 2017-02-05 NOTE — Addendum Note (Signed)
Addended by: [name] on: 02/05/2017 08:14 PM   Modules accepted: Orders

## 2017-02-07 ENCOUNTER — Encounter: Payer: Self-pay | Admitting: *Deleted

## 2017-02-08 ENCOUNTER — Ambulatory Visit (INDEPENDENT_AMBULATORY_CARE_PROVIDER_SITE_OTHER): Payer: PPO | Admitting: General Surgery

## 2017-02-08 ENCOUNTER — Encounter: Payer: Self-pay | Admitting: General Surgery

## 2017-02-08 ENCOUNTER — Inpatient Hospital Stay: Payer: Self-pay

## 2017-02-08 VITALS — BP 130/80 | HR 68 | Resp 18 | Ht 72.0 in | Wt 175.0 lb

## 2017-02-08 DIAGNOSIS — R221 Localized swelling, mass and lump, neck: Secondary | ICD-10-CM

## 2017-02-08 NOTE — Progress Notes (Signed)
Patient ID: Marc Gray, male   DOB: 1945-06-07, 71 y.o.   MRN: [id number]  Chief Complaint  Patient presents with  . Other    HPI Marc Gray is a 71 y.o. male  Here today for a evaluation of a neck node and lung mass.  Patient had a ct scan don eon 01/24/2017. Wife, Marc Gray is present.  HPI  Past Medical History:  Diagnosis Date  . Cataract   . Colon cancer ([place]) 2015   Pt states during his colonoscopy he had cancerous polyps removed.     No past surgical history on file.  Family History  Problem Relation Age of Onset  . Breast cancer Maternal Grandmother     Social History Social History  Substance Use Topics  . Smoking status: Current Some Day Smoker    Packs/day: 1.00    Years: 53.00    Types: Cigarettes  . Smokeless tobacco: Never Used  . Alcohol use No    No Known Allergies  Current Outpatient Prescriptions  Medication Sig Dispense Refill  . aspirin 325 MG tablet Take 650 mg by mouth daily.     . Chlorpheniramine-Phenylephrine (SINUS & ALLERGY) 4-10 MG tablet Take 1 tablet by mouth daily as needed.     . Multiple Vitamin (MULTI-VITAMINS) TABS Take 1 tablet by mouth daily.      No current facility-administered medications for this visit.     Review of Systems Review of Systems  Constitutional: Negative.   Respiratory: Negative.     Blood pressure 130/80, pulse 68, resp. rate 18, height 6' (1.829 m), weight 175 lb (79.4 kg).  Physical Exam Physical Exam  Constitutional: He is oriented to person, place, and time. He appears well-developed and well-nourished.  Eyes: Conjunctivae are normal. No scleral icterus.  Cardiovascular: Normal rate, regular rhythm and normal heart sounds.   Pulmonary/Chest: Effort normal and breath sounds normal.  Neurological: He is alert and oriented to person, place, and time.  Skin: Skin is warm and dry.    Data Reviewed CT of the neck dated 01/24/2017 obtained for palpable neck  mass: IMPRESSION: 1. Partially visualized large left upper lobe lung mass with mediastinal and left greater than right lower neck/supraclavicular lymphadenopathy consistent with metastatic lung cancer. 2. Aortic Atherosclerosis (ICD10-I70.0) and Emphysema (ICD10-J43.9).    Laboratory studies dated 02/02/2017 showed a hemoglobin of 14.3 with an MCV of 92, white blood cell count of 8800, platelet count 220,000. Comprehensive metabolic panel of the same date showed a creatinine of 1.3 with an estimated GFR 52. Normal electrolytes and liver function studies.  Limited ultrasound examination of the left neck to assess for possibility of core biopsy showed multiple, small superficial and deep left supraclavicular fossa lymph nodes with adjacent vascular structures.   Assessment    Metastatic lung cancer.    Plan    Biopsy sample volume was discussed with Marc Gray, M.D. from pathology. These are fairly small lymph nodes and a 18-gauge core biopsy can be obtained, but this may not provide adequate tissue volume for molecular testing.  While these are superficial in location, with adjacent vascular structures prominent I think he would be best managed by an open lymph node biopsy completed as an outpatient under sedation.  The case was discussed with Dr. [name] from medical oncology. At this time is unclear if the patient will require central venous access and this will be deferred pending pathology review.     HPI, Physical Exam, Assessment and Plan have  been scribed under the direction and in the presence of Hervey Ard, MD.  Gaspar Cola, CMA  I have completed the exam and reviewed the above documentation for accuracy and completeness.  I agree with the above.  Haematologist has been used and any errors in dictation or transcription are unintentional.  Hervey Ard, M.D., F.A.C.S.  Marc Gray 02/09/2017, 7:11 AM  Patient's surgery has been scheduled for 02-09-17 at  [facility]. The patient has been asked to be NPO after midnight.   Dominga Ferry, CMA

## 2017-02-09 ENCOUNTER — Ambulatory Visit: Payer: PPO | Admitting: Certified Registered Nurse Anesthetist

## 2017-02-09 ENCOUNTER — Encounter: Payer: Self-pay | Admitting: *Deleted

## 2017-02-09 ENCOUNTER — Encounter
Admission: RE | Disposition: A | Discharge status: Home / Self Care | Payer: Self-pay | Source: Ambulatory Visit | Attending: General Surgery

## 2017-02-09 ENCOUNTER — Other Ambulatory Visit: Payer: Self-pay

## 2017-02-09 ENCOUNTER — Ambulatory Visit
Admission: RE | Admit: 2017-02-09 | Discharge: 2017-02-09 | Disposition: A | Discharge status: Home / Self Care | Payer: PPO | Source: Ambulatory Visit | Attending: General Surgery | Admitting: General Surgery

## 2017-02-09 DIAGNOSIS — R221 Localized swelling, mass and lump, neck: Secondary | ICD-10-CM | POA: Insufficient documentation

## 2017-02-09 DIAGNOSIS — C770 Secondary and unspecified malignant neoplasm of lymph nodes of head, face and neck: Secondary | ICD-10-CM | POA: Diagnosis not present

## 2017-02-09 DIAGNOSIS — Z01810 Encounter for preprocedural cardiovascular examination: Secondary | ICD-10-CM | POA: Diagnosis not present

## 2017-02-09 DIAGNOSIS — C3490 Malignant neoplasm of unspecified part of unspecified bronchus or lung: Secondary | ICD-10-CM | POA: Diagnosis not present

## 2017-02-09 DIAGNOSIS — C3492 Malignant neoplasm of unspecified part of left bronchus or lung: Secondary | ICD-10-CM | POA: Diagnosis not present

## 2017-02-09 DIAGNOSIS — F17200 Nicotine dependence, unspecified, uncomplicated: Secondary | ICD-10-CM | POA: Insufficient documentation

## 2017-02-09 HISTORY — PX: LYMPH GLAND EXCISION: SHX13

## 2017-02-09 HISTORY — DX: Benign prostatic hyperplasia without lower urinary tract symptoms: N40.0

## 2017-02-09 SURGERY — EXCISION, LYMPH NODE, CERVICAL
Anesthesia: General

## 2017-02-09 MED ORDER — ONDANSETRON HCL 4 MG/2ML IJ SOLN: 4.0000 mg | Freq: Once | INTRAMUSCULAR | Status: DC | PRN

## 2017-02-09 MED ORDER — MIDAZOLAM HCL 2 MG/2ML IJ SOLN
INTRAMUSCULAR | Status: DC | PRN
  Administered 2017-02-09: 2 mg via INTRAVENOUS

## 2017-02-09 MED ORDER — PROPOFOL 500 MG/50ML IV EMUL
INTRAVENOUS | Status: DC | PRN
  Administered 2017-02-09: 25 ug/kg/min via INTRAVENOUS

## 2017-02-09 MED ORDER — FENTANYL CITRATE (PF) 100 MCG/2ML IJ SOLN
INTRAMUSCULAR | Status: AC
  Filled 2017-02-09: qty 2

## 2017-02-09 MED ORDER — ACETAMINOPHEN 325 MG PO TABS: 650.0000 mg | ORAL_TABLET | ORAL | Status: DC | PRN

## 2017-02-09 MED ORDER — FENTANYL CITRATE (PF) 100 MCG/2ML IJ SOLN: 25.0000 ug | INTRAMUSCULAR | Status: DC | PRN

## 2017-02-09 MED ORDER — FENTANYL CITRATE (PF) 100 MCG/2ML IJ SOLN
INTRAMUSCULAR | Status: DC | PRN
  Administered 2017-02-09 (×4): 25 ug via INTRAVENOUS

## 2017-02-09 MED ORDER — PROPOFOL 10 MG/ML IV BOLUS
INTRAVENOUS | Status: AC
  Filled 2017-02-09: qty 20

## 2017-02-09 MED ORDER — MIDAZOLAM HCL 2 MG/2ML IJ SOLN
INTRAMUSCULAR | Status: AC
  Filled 2017-02-09: qty 2

## 2017-02-09 MED ORDER — LACTATED RINGERS IV SOLN
INTRAVENOUS | Status: DC
  Administered 2017-02-09: 13:00:00 via INTRAVENOUS

## 2017-02-09 MED ORDER — GLYCOPYRROLATE 0.2 MG/ML IJ SOLN
INTRAMUSCULAR | Status: DC | PRN
  Administered 2017-02-09: 0.2 mg via INTRAVENOUS

## 2017-02-09 MED ORDER — LIDOCAINE HCL 1 % IJ SOLN
INTRAMUSCULAR | Status: DC | PRN
  Administered 2017-02-09: 13 mL

## 2017-02-09 SURGICAL SUPPLY — 30 items
APPLIER CLIP 11 MED OPEN (CLIP) ×2
BAG COUNTER SPONGE EZ (MISCELLANEOUS) ×2 IMPLANT
CHLORAPREP W/TINT 26ML (MISCELLANEOUS) ×2 IMPLANT
CLIP APPLIE 11 MED OPEN (CLIP) ×1 IMPLANT
CNTNR SPEC 2.5X3XGRAD LEK (MISCELLANEOUS) ×1
CONT SPEC 4OZ STER OR WHT (MISCELLANEOUS) ×1
CONTAINER SPEC 2.5X3XGRAD LEK (MISCELLANEOUS) ×1 IMPLANT
COVER PROBE FLX POLY STRL (MISCELLANEOUS) ×2 IMPLANT
DRAPE LAPAROTOMY 77X122 PED (DRAPES) ×2 IMPLANT
DRSG TEGADERM 2-3/8X2-3/4 SM (GAUZE/BANDAGES/DRESSINGS) ×2 IMPLANT
DRSG TELFA 4X3 1S NADH ST (GAUZE/BANDAGES/DRESSINGS) ×2 IMPLANT
ELECT REM PT RETURN 9FT ADLT (ELECTROSURGICAL) ×2
ELECTRODE REM PT RTRN 9FT ADLT (ELECTROSURGICAL) ×1 IMPLANT
GLOVE BIO SURGEON STRL SZ7.5 (GLOVE) ×2 IMPLANT
GLOVE INDICATOR 8.0 STRL GRN (GLOVE) ×2 IMPLANT
GOWN STRL REUS W/ TWL LRG LVL3 (GOWN DISPOSABLE) ×2 IMPLANT
GOWN STRL REUS W/TWL LRG LVL3 (GOWN DISPOSABLE) ×2
LABEL OR SOLS (LABEL) ×2 IMPLANT
NDL SAFETY 22GX1.5 (NEEDLE) ×2 IMPLANT
NEEDLE HYPO 25X1 1.5 SAFETY (NEEDLE) ×2 IMPLANT
NS IRRIG 500ML POUR BTL (IV SOLUTION) ×2 IMPLANT
PACK BASIN MINOR ARMC (MISCELLANEOUS) ×2 IMPLANT
STRAP SAFETY BODY (MISCELLANEOUS) ×2 IMPLANT
SUT VIC AB 3-0 54X BRD REEL (SUTURE) ×1 IMPLANT
SUT VIC AB 3-0 BRD 54 (SUTURE) ×1
SUT VIC AB 3-0 SH 27 (SUTURE) ×2
SUT VIC AB 3-0 SH 27X BRD (SUTURE) ×2 IMPLANT
SUT VIC AB 4-0 PS2 18 (SUTURE) ×2 IMPLANT
SWABSTK COMLB BENZOIN TINCTURE (MISCELLANEOUS) ×2 IMPLANT
SYR CONTROL 10ML (SYRINGE) ×4 IMPLANT

## 2017-02-09 NOTE — Op Note (Signed)
Preoperative diagnosis Left neck mass, suspected metastatic lung cancer.  Postoperative diagnosis: Same.  Operative procedure: Open biopsy left anterior lateral cervical lymph node.  Operating surgeon: Ollen Bowl, M.D.  Anesthen Xylocaine.1%, 13 mL. Clinical note: This 71 year old male presented with a left neck mass and CT showed enlarged lymph nodes and a left lung mass. Biopsy requested to confirm clinical diagnosis of metastatic lung cancer.  Operative note: The patient was placedin a slight head up position and rolled to the right. The neck was prepped with ChloraPrep and draped. Local anesthetic was infiltrated over the lesion was made and carried down to thru the platysmal layer. The node was mobilized with electrocautery. Vascular pedicle ligated with 3-0 Vicryl. The muscle layer was approximated with a 3-0 Vicryl figure-of-eight suture. Skin was closed with a running 4-0 Vicryl subcuticular suture. Benzoin, Steri-Strips, Telfa and Tegaderm dressing applied.  The lymph node was sent fresh to pathology for reviewient tolerated the procedure well was taken to the recovery room in stable condition.

## 2017-02-09 NOTE — Anesthesia Procedure Notes (Signed)
Performed by: Demetrius Charity Pre-anesthesia Checklist: Patient identified, Emergency Drugs available, Suction available, Patient being monitored and Timeout performed Oxygen Delivery Method: Nasal cannula

## 2017-02-09 NOTE — Anesthesia Post-op Follow-up Note (Signed)
Anesthesia QCDR form completed.        

## 2017-02-09 NOTE — Transfer of Care (Signed)
Immediate Anesthesia Transfer of Care Note  Patient: Marc Gray  Procedure(s) Performed: Procedure(s): CERVICAL LYMPH NODE BIOPSY (N/A)  Patient Location: PACU  Anesthesia Type:MAC  Level of Consciousness: awake, alert  and oriented  Airway & Oxygen Therapy: Patient Spontanous Breathing and Patient connected to nasal cannula oxygen  Post-op Assessment: Report given to RN and Post -op Vital signs reviewed and stable  Post vital signs: Reviewed and stable  Last Vitals:  Vitals:   02/09/17 1156  BP: (!) 152/85  Pulse: 66  Resp: 14  Temp: (!) 35.8 C  SpO2: 98%    Last Pain:  Vitals:   02/09/17 1156  TempSrc: Oral         Complications: No apparent anesthesia complications

## 2017-02-09 NOTE — OR Nursing (Signed)
Dr. [name] notified of pt's condition and desire to go home.  He Ok'd dc home.  Mild cough.  Oxygenating well.

## 2017-02-09 NOTE — Anesthesia Postprocedure Evaluation (Signed)
Anesthesia Post Note  Patient: Marc Gray  Procedure(s) Performed: Procedure(s) (LRB): CERVICAL LYMPH NODE BIOPSY (N/A)  Patient location during evaluation: PACU Anesthesia Type: General Level of consciousness: awake and alert and oriented Pain management: pain level controlled Vital Signs Assessment: post-procedure vital signs reviewed and stable Respiratory status: spontaneous breathing Cardiovascular status: blood pressure returned to baseline Anesthetic complications: no     Last Vitals:  Vitals:   02/09/17 1419 02/09/17 1439  BP: (!) 151/89 (!) 147/74  Pulse: 63 (!) 59  Resp: 16 16  Temp:    SpO2: 98% 98%    Last Pain:  Vitals:   02/09/17 1439  TempSrc:   PainSc: 1                  Brandace Cargle

## 2017-02-09 NOTE — Anesthesia Preprocedure Evaluation (Addendum)
Anesthesia Evaluation  Patient identified by MRN, date of birth, ID band Patient awake    Reviewed: Allergy & Precautions, NPO status , Patient's Chart, lab work & pertinent test results  Airway Mallampati: II  TM Distance: >3 FB     Dental   Pulmonary Current Smoker,    Pulmonary exam normal        Cardiovascular negative cardio ROS Normal cardiovascular exam     Neuro/Psych negative neurological ROS  negative psych ROS   GI/Hepatic Neg liver ROS, Colon polyps   Endo/Other  negative endocrine ROS  Renal/GU negative Renal ROS  negative genitourinary   Musculoskeletal negative musculoskeletal ROS (+)   Abdominal Normal abdominal exam  (+)   Peds negative pediatric ROS (+)  Hematology negative hematology ROS (+)   Anesthesia Other Findings Past Medical History: No date: Cataract 2015: Colon cancer ([place])     Comment:  Pt states during his colonoscopy he had cancerous polyps              removed.  No date: Enlarged prostate  Upper lobe lung mass 53 pack year Hx of smoking  Reproductive/Obstetrics                            Anesthesia Physical Anesthesia Plan  ASA: II  Anesthesia Plan: General   Post-op Pain Management:    Induction: Intravenous  PONV Risk Score and Plan:   Airway Management Planned: Nasal Cannula  Additional Equipment:   Intra-op Plan:   Post-operative Plan:   Informed Consent: I have reviewed the patients History and Physical, chart, labs and discussed the procedure including the risks, benefits and alternatives for the proposed anesthesia with the patient or authorized representative who has indicated his/her understanding and acceptance.   Dental advisory given  Plan Discussed with: Surgeon and CRNA  Anesthesia Plan Comments:        Anesthesia Quick Evaluation

## 2017-02-09 NOTE — Discharge Instructions (Signed)

## 2017-02-09 NOTE — H&P (Signed)
No interval change. For left neck node biopsy.

## 2017-02-12 ENCOUNTER — Ambulatory Visit
Admission: RE | Admit: 2017-02-12 | Discharge: 2017-02-12 | Disposition: A | Discharge status: Home / Self Care | Payer: PPO | Source: Ambulatory Visit | Attending: Oncology | Admitting: Oncology

## 2017-02-12 ENCOUNTER — Encounter
Admission: RE | Admit: 2017-02-12 | Discharge: 2017-02-12 | Disposition: A | Discharge status: Home / Self Care | Payer: PPO | Source: Ambulatory Visit | Attending: Oncology | Admitting: Oncology

## 2017-02-12 DIAGNOSIS — E279 Disorder of adrenal gland, unspecified: Secondary | ICD-10-CM | POA: Diagnosis not present

## 2017-02-12 DIAGNOSIS — I313 Pericardial effusion (noninflammatory): Secondary | ICD-10-CM | POA: Diagnosis not present

## 2017-02-12 DIAGNOSIS — I2510 Atherosclerotic heart disease of native coronary artery without angina pectoris: Secondary | ICD-10-CM | POA: Diagnosis not present

## 2017-02-12 DIAGNOSIS — I6782 Cerebral ischemia: Secondary | ICD-10-CM | POA: Insufficient documentation

## 2017-02-12 DIAGNOSIS — I700 Atherosclerosis of aorta: Secondary | ICD-10-CM | POA: Insufficient documentation

## 2017-02-12 DIAGNOSIS — R590 Localized enlarged lymph nodes: Secondary | ICD-10-CM | POA: Diagnosis not present

## 2017-02-12 DIAGNOSIS — R918 Other nonspecific abnormal finding of lung field: Secondary | ICD-10-CM | POA: Diagnosis not present

## 2017-02-12 DIAGNOSIS — R9082 White matter disease, unspecified: Secondary | ICD-10-CM | POA: Insufficient documentation

## 2017-02-12 LAB — GLUCOSE, CAPILLARY: GLUCOSE-CAPILLARY: 85 mg/dL (ref 65–99)

## 2017-02-12 MED ORDER — GADOBENATE DIMEGLUMINE 529 MG/ML IV SOLN
15.0000 mL | Freq: Once | INTRAVENOUS | Status: AC | PRN
  Administered 2017-02-12: 15 mL via INTRAVENOUS

## 2017-02-12 MED ORDER — FLUDEOXYGLUCOSE F - 18 (FDG) INJECTION
12.0000 | Freq: Once | INTRAVENOUS | Status: AC | PRN
  Administered 2017-02-12: 12.6 via INTRAVENOUS

## 2017-02-14 ENCOUNTER — Encounter: Payer: Self-pay | Admitting: Oncology

## 2017-02-14 ENCOUNTER — Inpatient Hospital Stay (HOSPITAL_BASED_OUTPATIENT_CLINIC_OR_DEPARTMENT_OTHER): Payer: PPO | Admitting: Oncology

## 2017-02-14 ENCOUNTER — Encounter: Payer: Self-pay | Admitting: General Surgery

## 2017-02-14 ENCOUNTER — Other Ambulatory Visit: Payer: Self-pay | Admitting: Pathology

## 2017-02-14 ENCOUNTER — Ambulatory Visit (INDEPENDENT_AMBULATORY_CARE_PROVIDER_SITE_OTHER): Payer: PPO | Admitting: General Surgery

## 2017-02-14 VITALS — BP 123/69 | HR 64 | Temp 96.0°F | Wt 172.5 lb

## 2017-02-14 VITALS — BP 130/70 | HR 65 | Resp 12 | Ht 72.0 in | Wt 168.0 lb

## 2017-02-14 DIAGNOSIS — C3492 Malignant neoplasm of unspecified part of left bronchus or lung: Secondary | ICD-10-CM | POA: Insufficient documentation

## 2017-02-14 DIAGNOSIS — Z803 Family history of malignant neoplasm of breast: Secondary | ICD-10-CM | POA: Diagnosis not present

## 2017-02-14 DIAGNOSIS — I6529 Occlusion and stenosis of unspecified carotid artery: Secondary | ICD-10-CM | POA: Diagnosis not present

## 2017-02-14 DIAGNOSIS — J432 Centrilobular emphysema: Secondary | ICD-10-CM | POA: Diagnosis not present

## 2017-02-14 DIAGNOSIS — R05 Cough: Secondary | ICD-10-CM

## 2017-02-14 DIAGNOSIS — C3480 Malignant neoplasm of overlapping sites of unspecified bronchus and lung: Secondary | ICD-10-CM

## 2017-02-14 DIAGNOSIS — I700 Atherosclerosis of aorta: Secondary | ICD-10-CM | POA: Diagnosis not present

## 2017-02-14 DIAGNOSIS — Z7982 Long term (current) use of aspirin: Secondary | ICD-10-CM

## 2017-02-14 DIAGNOSIS — Z79899 Other long term (current) drug therapy: Secondary | ICD-10-CM | POA: Diagnosis not present

## 2017-02-14 DIAGNOSIS — C770 Secondary and unspecified malignant neoplasm of lymph nodes of head, face and neck: Secondary | ICD-10-CM | POA: Diagnosis not present

## 2017-02-14 DIAGNOSIS — Z85038 Personal history of other malignant neoplasm of large intestine: Secondary | ICD-10-CM

## 2017-02-14 DIAGNOSIS — N400 Enlarged prostate without lower urinary tract symptoms: Secondary | ICD-10-CM | POA: Diagnosis not present

## 2017-02-14 DIAGNOSIS — R590 Localized enlarged lymph nodes: Secondary | ICD-10-CM

## 2017-02-14 DIAGNOSIS — Z716 Tobacco abuse counseling: Secondary | ICD-10-CM

## 2017-02-14 DIAGNOSIS — F17210 Nicotine dependence, cigarettes, uncomplicated: Secondary | ICD-10-CM

## 2017-02-14 LAB — SURGICAL PATHOLOGY

## 2017-02-14 NOTE — Progress Notes (Signed)
Hematology/Oncology Follow Up Note [facility] Telephone:([phone]) [phone] Fax:(336) [phone]  CONSULT NOTE Patient Care Team: [name], MD as PCP - General (Family Medicine) [name], [name], MD (General Surgery) [name], MD as Consulting Physician (Oncology) [name], RN as Registered Nurse  Reason for Visit: follow up s/p lymph node biopsy  HISTORY OF PRESENTING ILLNESS:  Marc Gray 71 y.o.  male who was referred by Dr.Hedrick Jeneen Rinks to see me for evaluation of neck mass.  Patient has felt a neck mass for about 7-8 months and lately also atypical chest pain and he went to see primary care physician Dr.Hedrick. CT neck was done which showed multiple cervical/supraclavical lymphadenopathy as well as left upper lobe mass.   Patient has 53 pack year smoking history and currently daily smoker  INTERVAL HISTORY Patient presents to discuss about the results. No new complaints.    ROS:  Review of Systems  Constitutional: Negative.   HENT:  Negative.   Eyes: Negative.   Respiratory: Positive for cough.   Cardiovascular: Positive for chest pain.  Gastrointestinal: Negative.   Endocrine: Negative.   Genitourinary: Negative.    Musculoskeletal: Negative.   Skin: Negative.   Neurological: Negative.   Hematological: Negative.   Psychiatric/Behavioral: Negative.     MEDICAL HISTORY:  Past Medical History:  Diagnosis Date  . Cataract   . Colon cancer ([place]) 2015   Pt states during his colonoscopy he had cancerous polyps removed.   . Enlarged prostate     SURGICAL HISTORY: Past Surgical History:  Procedure Laterality Date  . CATARACT EXTRACTION W/ INTRAOCULAR LENS  IMPLANT, BILATERAL    . COLONOSCOPY    . LYMPH GLAND EXCISION N/A 02/09/2017   Procedure: CERVICAL LYMPH NODE BIOPSY;  Surgeon: Robert Bellow, MD;  Location: [facility] ORS;  Service: General;  Laterality: N/A;  . PROSTATE SURGERY    . TONSILLECTOMY      SOCIAL  HISTORY: Social History   Social History  . Marital status: Married    Spouse name: N/A  . Number of children: N/A  . Years of education: N/A   Occupational History  . Not on file.   Social History Main Topics  . Smoking status: Current Every Day Smoker    Packs/day: 1.00    Years: 53.00    Types: Cigarettes  . Smokeless tobacco: Never Used  . Alcohol use Yes     Comment: occassional  . Drug use: No  . Sexual activity: Yes   Other Topics Concern  . Not on file   Social History Narrative  . No narrative on file    FAMILY HISTORY: Family History  Problem Relation Age of Onset  . Breast cancer Maternal Grandmother   . Dementia Mother   . Diabetes Father   . Stroke Father     ALLERGIES:  has No Known Allergies.  MEDICATIONS:  Current Outpatient Prescriptions  Medication Sig Dispense Refill  . aspirin 325 MG tablet Take 650 mg by mouth daily.     . Chlorpheniramine-Phenylephrine (SINUS & ALLERGY) 4-10 MG tablet Take 1 tablet by mouth daily as needed.     . Multiple Vitamin (MULTI-VITAMINS) TABS Take 1 tablet by mouth daily.      No current facility-administered medications for this visit.       [name]  PHYSICAL EXAMINATION: ECOG PERFORMANCE STATUS: 0 - Asymptomatic Vitals:   02/14/17 1102  BP: 123/69  Pulse: 64  Temp: (!) 96 F (35.6 C)   Filed  Weights   02/14/17 1102  Weight: 172 lb 8 oz (78.2 kg)    GENERAL: Well-nourished well-developed; Alert, no distress and comfortable.  EYES: no pallor or icterus OROPHARYNX: no thrush or ulceration; good dentition  NECK: supple, no masses felt LYMPH: left supraclavicular lymph adenopathy, multiple other cervical lymph nodes.  LUNGS: clear to auscultation and  No wheeze or crackles HEART/CVS: regular rate & rhythm and no murmurs; No lower extremity edema ABDOMEN: abdomen soft, non-tender and normal bowel sounds Musculoskeletal:no cyanosis of digits and no clubbing  PSYCH: alert & oriented x 3  NEURO: no focal  motor/sensory deficits SKIN:  no rashes or significant lesions  LABORATORY DATA:  I have reviewed the data as listed Lab Results  Component Value Date   WBC 8.8 02/02/2017   HGB 14.3 02/02/2017   HCT 41.5 02/02/2017   MCV 91.8 02/02/2017   PLT 220 02/02/2017    Recent Labs  02/02/17 1603  NA 135  K 4.3  CL 103  CO2 23  GLUCOSE 102*  BUN 22*  CREATININE 1.34*  CALCIUM 9.1  GFRNONAA 52*  GFRAA >60  PROT 7.2  ALBUMIN 3.7  AST 17  ALT 19  ALKPHOS 55  BILITOT 0.8    RADIOGRAPHIC STUDIES: I have personally reviewed the radiological images as listed and agreed with the findings in the report. Ct Soft Tissue Neck W Contrast  Result Date: 01/24/2017 CLINICAL DATA:  Left supraclavicular mass for 9 months. EXAM: CT NECK WITH CONTRAST TECHNIQUE: Multidetector CT imaging of the neck was performed using the standard protocol following the bolus administration of intravenous contrast. CONTRAST:  43mL ISOVUE-300 IOPAMIDOL (ISOVUE-300) INJECTION 61% COMPARISON:  None. FINDINGS: Pharynx and larynx: Symmetric pharyngeal soft tissues without evidence of mass. No fluid collection or significant inflammatory changes in the parapharyngeal or retropharyngeal spaces. Unremarkable larynx. Salivary glands: No inflammation, mass, or stone. Thyroid: Unremarkable. Lymph nodes: Multiple enlarged, rounded left supraclavicular lymph nodes measure up to 12 mm in short axis. An abnormal left level V lymph node more superiorly in the left lateral neck measures 9 mm. An 8 mm short axis left level Gray lymph node has an abnormal rounded configuration. An enlarged left level Gray lymph node more inferiorly measures 13 mm, while left level IV lymph nodes measure 10 mm. There is a 10 mm lymph node in the right tracheoesophageal groove near the thoracic inlet. A low right level Gray lymph node measures 8 mm, and there is a 6 mm right level Gray lymph node more superiorly. Vascular: Major vascular structures of the neck are  patent. Mild right greater than left cervical carotid artery atherosclerosis. Mild thoracic aortic atherosclerosis. Limited intracranial: Unremarkable. Visualized orbits: Bilateral cataract extraction. Mastoids and visualized paranasal sinuses: Partially visualized right frontal sinus opacification. Mild right ethmoid air cell mucosal thickening. Clear mastoid air cells. Cerumen in the left EAC. Skeleton: No suspicious lytic or blastic osseous lesion. Moderate lower cervical disc degeneration, greatest at C6-7. Upper chest: Multiple enlarged mediastinal lymph nodes including a 2.1 cm low right paratracheal node and a 1.5 cm prevascular node. Large left upper lobe mass extending to the pleural surface in the anterior/apical left upper lobe. The mass measures greater than 10 cm in craniocaudal length. The mass extends inferiorly to the left hilum where there is soft tissue encasing the hilar vessels as well as extending posteriorly into the superior segment of the left lower lobe. The hilar component was incompletely imaged. There is centrilobular emphysema. Other: 1.5 cm subcutaneous low-density lesion  in the posterior midline of the lower neck, likely a sebaceous cyst. IMPRESSION: 1. Partially visualized large left upper lobe lung mass with mediastinal and left greater than right lower neck/supraclavicular lymphadenopathy consistent with metastatic lung cancer. 2. Aortic Atherosclerosis (ICD10-I70.0) and Emphysema (ICD10-J43.9). These results will be called to the ordering clinician or representative by the Radiologist Assistant, and communication documented in the PACS or zVision Dashboard. Electronically Signed   By: Logan Bores M.D.   On: 01/24/2017 11:06   Mr Jeri Cos WN Contrast  Result Date: 02/12/2017 CLINICAL DATA:  Lung mass.  Staging EXAM: MRI HEAD WITHOUT AND WITH CONTRAST TECHNIQUE: Multiplanar, multiecho pulse sequences of the brain and surrounding structures were obtained without and with  intravenous contrast. CONTRAST:  15 mL MultiHance IV COMPARISON:  None. FINDINGS: Brain: Ventricle size normal. Cerebral volume normal for age. Scattered small hyperintensities in the subcortical white matter bilaterally. Small hyperintensity deep white matter on the left. Negative for acute infarct or hemorrhage. Negative for mass or edema. Normal enhancement postcontrast infusion. No enhancing mass lesion. Leptomeningeal enhancement normal. Vascular: Normal arterial flow void Skull and upper cervical spine: Negative Sinuses/Orbits: Mucosal edema in the paranasal sinuses. Bilateral cataract removal. Mastoid sinus clear Other: None IMPRESSION: Negative for metastatic disease Chronic microvascular ischemic changes in the white matter Sinus mucosal disease. Electronically Signed   By: Franchot Gallo M.D.   On: 02/12/2017 12:05   Nm Pet Image Initial (pi) Skull Base To Thigh  Result Date: 02/12/2017 CLINICAL DATA:  Initial treatment strategy for lung mass. EXAM: NUCLEAR MEDICINE PET SKULL BASE TO THIGH TECHNIQUE: 12.6 mCi F-18 FDG was injected intravenously. Full-ring PET imaging was performed from the skull base to thigh after the radiotracer. CT data was obtained and used for attenuation correction and anatomic localization. FASTING BLOOD GLUCOSE:  Value: 85 mg/dl COMPARISON:  CT neck 01/24/2017. FINDINGS: NECK: No hypermetabolic lymph nodes in the neck. CT images show no acute findings. CHEST: Left supraclavicular lymph nodes measure up to 12 mm (CT image 48) with an SUV max of 4.1. Low left internal jugular lymph node measures 13 mm (CT image 64) with an SUV max of 7.2. Lymph node posterior to the right clavicle measures 13 mm (CT image 63) with an SUV max of 5.5. There are hypermetabolic lymph nodes throughout the mediastinum and both hilar regions. Index low right paratracheal lymph node measures 2.3 cm (CT image 84) with an SUV max of 9.3. Index right hilar hypermetabolism has an SUV max of 5.5. Correlation  with CT is difficult due to lack of post-contrast imaging. Spiculated left upper lobe mass measures 4.8 x 6.0 cm with an SUV max of 9.0 and with extension to the left hilum and superior segment left lower lobe. Atherosclerotic calcification of the arterial vasculature, including coronary arteries. Heart is at the upper limits of normal in size to mildly enlarged. Small pericardial effusion. A few scattered patchy areas of peribronchovascular ground-glass are nonspecific and may be infectious or inflammatory in etiology. No pleural fluid. ABDOMEN/PELVIS: No abnormal hypermetabolism in the liver. Vague mild hypermetabolism in the right adrenal gland does not appear to have a CT correlate. No abnormal hypermetabolism in the left adrenal gland, spleen or pancreas. No hypermetabolic lymph nodes. CT images show the liver, gallbladder, adrenal glands and right kidney to be grossly unremarkable. Left kidney is atrophic. Spleen, pancreas, stomach and bowel are grossly unremarkable. Gastrohepatic ligament lymph nodes are not enlarged by CT size criteria. Atherosclerotic calcification of the arterial vasculature without abdominal  aortic aneurysm. TURP defect. SKELETON: No abnormal osseous hypermetabolism. Degenerative changes in the spine. IMPRESSION: 1. Hypermetabolic left upper lobe mass with involvement of the left hilum and superior segment left lower lobe as well as hypermetabolic mediastinal, right hilar and supraclavicular adenopathy, most consistent with primary bronchogenic carcinoma (at least T2bN3M0 or stage IIIB disease). 2. Vague hypermetabolism in the right adrenal gland without definite CT correlate. 3. Small pericardial effusion. 4. A few scattered areas of peribronchovascular ground-glass are nonspecific and may be infectious or inflammatory in etiology. Metastatic disease cannot be definitively excluded. 5. Aortic atherosclerosis (ICD10-170.0). Coronary artery calcification. Electronically Signed   By:  Lorin Picket M.D.   On: 02/12/2017 10:34    ASSESSMENT & PLAN:  1. Adenocarcinoma of left lung, stage 3 (HCC)   2. Cervical lymphadenopathy   3. Tobacco abuse counseling    I had a lengthy discussion with patient about image finding which is very concerning for primary lung cancer.  Pathology was pending at the time patient is in the clinic. I discussed with him that most likely lung cancer and we are awaiting the final report to guide further treatment plan. Patient voices understanding and is motivated for treatment.   Pathology results of left supraclavicular lymph node revealed lung adenocarcinoma. Coming the PET scan findings, he has stage IIIB lung adenocarcinoma.  Plan concurrent chemotherapy Carbo Taxol with RT, followed by immunotherapy consolidation.  Chemotherapy is briefly discussed with patient and he is willing to proceed.   # smoke cessation is discussed with patient in detail. Will provide him information of supporting program for quitting smoking.  # Refer to RadOnc for evaluation.   Patient will be out of town next week and prefer to proceed work up after he returns.  All questions were answered. The patient knows to call the clinic with any problems questions or concerns.  Return of visit: 1 week.  Thank you for this kind referral and the opportunity to participate in the care of this patient. A copy of today's note is routed to referring provider Dr.Hedrick.    [name], MD, PhD Hematology Oncology [facility] Pager- [phone] 02/14/2017

## 2017-02-14 NOTE — Progress Notes (Signed)
Patient ID: Marc Gray, male   DOB: 03-30-1946, 71 y.o.   MRN: [id number]  Chief Complaint  Patient presents with  . Routine Post Op    HPI Marc Gray is a 71 y.o. male here today for his post op open left anterior cervical lymph node done on 02/09/2017.  HPI  Past Medical History:  Diagnosis Date  . Cataract   . Colon cancer ([place]) 2015   Pt states during his colonoscopy he had cancerous polyps removed.   . Enlarged prostate     Past Surgical History:  Procedure Laterality Date  . CATARACT EXTRACTION W/ INTRAOCULAR LENS  IMPLANT, BILATERAL    . COLONOSCOPY    . LYMPH GLAND EXCISION N/A 02/09/2017   Procedure: CERVICAL LYMPH NODE BIOPSY;  Surgeon: Robert Bellow, MD;  Location: [facility] ORS;  Service: General;  Laterality: N/A;  . PROSTATE SURGERY    . TONSILLECTOMY      Family History  Problem Relation Age of Onset  . Breast cancer Maternal Grandmother   . Dementia Mother   . Diabetes Father   . Stroke Father     Social History Social History  Substance Use Topics  . Smoking status: Current Every Day Smoker    Packs/day: 1.00    Years: 53.00    Types: Cigarettes  . Smokeless tobacco: Never Used  . Alcohol use Yes     Comment: occassional    No Known Allergies  Current Outpatient Prescriptions  Medication Sig Dispense Refill  . aspirin 325 MG tablet Take 650 mg by mouth daily.     . Chlorpheniramine-Phenylephrine (SINUS & ALLERGY) 4-10 MG tablet Take 1 tablet by mouth daily as needed.     . Multiple Vitamin (MULTI-VITAMINS) TABS Take 1 tablet by mouth daily.      No current facility-administered medications for this visit.     Review of Systems Review of Systems  Constitutional: Negative.   Respiratory: Negative.   Cardiovascular: Negative.     Blood pressure 130/70, pulse 65, resp. rate 12, height 6' (1.829 m), weight 168 lb (76.2 kg).  Physical Exam Physical Exam  Constitutional: He is oriented to person, place,  and time. He appears well-developed and well-nourished.  Neck:    Neurological: He is alert and oriented to person, place, and time.  Skin: Skin is warm and dry.    Data Reviewed Biopsy results from 02/09/2017:  A. LYMPH NODE, LEFT CERVICAL; EXCISION:  - METASTATIC ADENOCARCINOMA OF LUNG ORIGIN, SEE NOTE.    Assessment    Doing well status post biopsy.    Plan    The patient is following up with medical oncology later today. Further recommendations from their office. We had previously reviewed PowerPort placement if this is necessary.    Follow up appointment to be announced. The patient is aware to call back for any questions or concerns.  HPI, Physical Exam, Assessment and Plan have been scribed under the direction and in the presence of Hervey Ard, MD.  Gaspar Cola, CMA  I have completed the exam and reviewed the above documentation for accuracy and completeness.  I agree with the above.  Haematologist has been used and any errors in dictation or transcription are unintentional.  Hervey Ard, M.D., F.A.C.S.   Robert Bellow 02/16/2017, 8:30 PM

## 2017-02-14 NOTE — Patient Instructions (Signed)
Follow up appointment to be announced. The patient is aware to call back for any questions or concerns.

## 2017-02-14 NOTE — Progress Notes (Signed)
Patient here today for follow up.   

## 2017-02-14 NOTE — Progress Notes (Signed)
START ON PATHWAY REGIMEN - Non-Small Cell Lung     Administer weekly:     Paclitaxel      Carboplatin   **Always confirm dose/schedule in your pharmacy ordering system**    Patient Characteristics: Stage III - Unresectable, PS = 0, 1 AJCC T Category: T2b Current Disease Status: No Distant Mets or Local Recurrence AJCC N Category: N3 AJCC M Category: M0 AJCC 8 Stage Grouping: IIIB Performance Status: PS = 0, 1 Intent of Therapy: Curative Intent, Discussed with Patient

## 2017-02-15 ENCOUNTER — Other Ambulatory Visit: Payer: Self-pay | Admitting: *Deleted

## 2017-02-15 ENCOUNTER — Telehealth: Payer: Self-pay | Admitting: *Deleted

## 2017-02-15 DIAGNOSIS — Z452 Encounter for adjustment and management of vascular access device: Secondary | ICD-10-CM

## 2017-02-15 NOTE — Telephone Encounter (Signed)
Message left on cell phone for patient to call the office.   We need to get port placement arranged. Also, need to find out when the [facility] plans to start treatments.

## 2017-02-16 DIAGNOSIS — C3480 Malignant neoplasm of overlapping sites of unspecified bronchus and lung: Secondary | ICD-10-CM | POA: Insufficient documentation

## 2017-02-16 NOTE — Telephone Encounter (Signed)
Message also left with spouse, [name] for the patient to call the office to schedule his port placement

## 2017-02-16 NOTE — Telephone Encounter (Signed)
Message left on cell phone for the patient to call the office to scheduled a port placement.

## 2017-02-16 NOTE — Patient Instructions (Signed)

## 2017-02-19 ENCOUNTER — Other Ambulatory Visit: Payer: Self-pay | Admitting: General Surgery

## 2017-02-19 ENCOUNTER — Telehealth: Payer: Self-pay

## 2017-02-19 DIAGNOSIS — C3490 Malignant neoplasm of unspecified part of unspecified bronchus or lung: Secondary | ICD-10-CM

## 2017-02-19 NOTE — Telephone Encounter (Signed)
Called and left message on cell numbers for patient and his wife.   Also, called patient's daughter, Marc Gray, and was able to speak with her today to let her know we have been trying to reach patient since last week. She reports that her dad is sleeping right now and they have been out of town since last Wednesday. Daughter will have patient call the office.

## 2017-02-19 NOTE — Progress Notes (Signed)
Central venous access has been requested by medical oncology for treatment of his recently diagnosed adenocarcinoma of the lung.  Based on plans for a chemoradiation will place the PowerPort on the right side. To minimize risk of injury to his lung will plan for a placement via the internal jugular vein.

## 2017-02-19 NOTE — Telephone Encounter (Signed)
Patient's surgery has been scheduled for 02-23-17 at [facility].

## 2017-02-19 NOTE — Telephone Encounter (Signed)
Patient called and is concerned about his surgical site of left neck. He reports that the area is a little raised, no redness but does have some mild discomfort in the area. He was doing a lot of moving furniture over the weekend with his mother and moving her into a retirement home and is not sure if that may be the cause. He denies any warmth to the area. He will continue to monitor the area and call us back with any changes.

## 2017-02-20 ENCOUNTER — Inpatient Hospital Stay: Payer: PPO

## 2017-02-20 DIAGNOSIS — C3492 Malignant neoplasm of unspecified part of left bronchus or lung: Secondary | ICD-10-CM

## 2017-02-21 ENCOUNTER — Inpatient Hospital Stay (HOSPITAL_BASED_OUTPATIENT_CLINIC_OR_DEPARTMENT_OTHER): Payer: PPO | Admitting: Oncology

## 2017-02-21 ENCOUNTER — Ambulatory Visit
Admission: RE | Admit: 2017-02-21 | Discharge: 2017-02-21 | Disposition: A | Discharge status: Home / Self Care | Payer: PPO | Source: Ambulatory Visit | Attending: Radiation Oncology | Admitting: Radiation Oncology

## 2017-02-21 ENCOUNTER — Encounter: Payer: Self-pay | Admitting: Radiation Oncology

## 2017-02-21 ENCOUNTER — Encounter: Payer: Self-pay | Admitting: *Deleted

## 2017-02-21 ENCOUNTER — Encounter
Admission: RE | Admit: 2017-02-21 | Discharge: 2017-02-21 | Disposition: A | Discharge status: Home / Self Care | Payer: PPO | Source: Ambulatory Visit | Attending: General Surgery | Admitting: General Surgery

## 2017-02-21 ENCOUNTER — Encounter: Payer: Self-pay | Admitting: Oncology

## 2017-02-21 VITALS — BP 132/74 | HR 68 | Temp 97.0°F | Wt 174.3 lb

## 2017-02-21 VITALS — BP 132/74 | HR 68 | Temp 97.0°F | Resp 12 | Ht 72.0 in | Wt 174.0 lb

## 2017-02-21 DIAGNOSIS — R590 Localized enlarged lymph nodes: Secondary | ICD-10-CM

## 2017-02-21 DIAGNOSIS — Z716 Tobacco abuse counseling: Secondary | ICD-10-CM

## 2017-02-21 DIAGNOSIS — C770 Secondary and unspecified malignant neoplasm of lymph nodes of head, face and neck: Secondary | ICD-10-CM | POA: Diagnosis not present

## 2017-02-21 DIAGNOSIS — N400 Enlarged prostate without lower urinary tract symptoms: Secondary | ICD-10-CM

## 2017-02-21 DIAGNOSIS — C778 Secondary and unspecified malignant neoplasm of lymph nodes of multiple regions: Secondary | ICD-10-CM | POA: Insufficient documentation

## 2017-02-21 DIAGNOSIS — F419 Anxiety disorder, unspecified: Secondary | ICD-10-CM | POA: Diagnosis not present

## 2017-02-21 DIAGNOSIS — R0602 Shortness of breath: Secondary | ICD-10-CM | POA: Diagnosis not present

## 2017-02-21 DIAGNOSIS — Z803 Family history of malignant neoplasm of breast: Secondary | ICD-10-CM | POA: Diagnosis not present

## 2017-02-21 DIAGNOSIS — Z85038 Personal history of other malignant neoplasm of large intestine: Secondary | ICD-10-CM

## 2017-02-21 DIAGNOSIS — F17210 Nicotine dependence, cigarettes, uncomplicated: Secondary | ICD-10-CM

## 2017-02-21 DIAGNOSIS — C3492 Malignant neoplasm of unspecified part of left bronchus or lung: Secondary | ICD-10-CM

## 2017-02-21 DIAGNOSIS — M25511 Pain in right shoulder: Secondary | ICD-10-CM | POA: Diagnosis not present

## 2017-02-21 DIAGNOSIS — C3412 Malignant neoplasm of upper lobe, left bronchus or lung: Secondary | ICD-10-CM | POA: Insufficient documentation

## 2017-02-21 DIAGNOSIS — R05 Cough: Secondary | ICD-10-CM | POA: Diagnosis not present

## 2017-02-21 DIAGNOSIS — I6529 Occlusion and stenosis of unspecified carotid artery: Secondary | ICD-10-CM

## 2017-02-21 DIAGNOSIS — R918 Other nonspecific abnormal finding of lung field: Secondary | ICD-10-CM

## 2017-02-21 DIAGNOSIS — R079 Chest pain, unspecified: Secondary | ICD-10-CM | POA: Insufficient documentation

## 2017-02-21 DIAGNOSIS — R221 Localized swelling, mass and lump, neck: Secondary | ICD-10-CM | POA: Diagnosis not present

## 2017-02-21 DIAGNOSIS — Z7982 Long term (current) use of aspirin: Secondary | ICD-10-CM | POA: Insufficient documentation

## 2017-02-21 DIAGNOSIS — M129 Arthropathy, unspecified: Secondary | ICD-10-CM | POA: Insufficient documentation

## 2017-02-21 DIAGNOSIS — I700 Atherosclerosis of aorta: Secondary | ICD-10-CM | POA: Diagnosis not present

## 2017-02-21 DIAGNOSIS — Z79899 Other long term (current) drug therapy: Secondary | ICD-10-CM

## 2017-02-21 DIAGNOSIS — J432 Centrilobular emphysema: Secondary | ICD-10-CM | POA: Diagnosis not present

## 2017-02-21 DIAGNOSIS — Z510 Encounter for antineoplastic radiation therapy: Secondary | ICD-10-CM | POA: Diagnosis not present

## 2017-02-21 DIAGNOSIS — R0789 Other chest pain: Secondary | ICD-10-CM

## 2017-02-21 HISTORY — DX: Dyspnea, unspecified: R06.00

## 2017-02-21 HISTORY — DX: Anxiety disorder, unspecified: F41.9

## 2017-02-21 HISTORY — DX: Pain, unspecified: R52

## 2017-02-21 HISTORY — DX: Depression, unspecified: F32.A

## 2017-02-21 HISTORY — DX: Major depressive disorder, single episode, unspecified: F32.9

## 2017-02-21 NOTE — Progress Notes (Signed)
NEW PATIENT EVALUATION  Name: Marc Gray  MRN: [id number]  Date:   02/21/2017     DOB: March 22, 1946   This 71 y.o. 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 Lung Cancer    Initial Consult    DIAGNOSIS: The encounter diagnosis was Malignant neoplasm of left lung, unspecified part of lung ([place]).   PREVIOUS INVESTIGATIONS:  PET CT and CT scans reviewed Pathology report reviewed Clinical notes reviewed Case presented at weekly tumor conference  HPI: patient is a 71 year old male who saw his PMD for evaluation of progressive nodularity in his left neck. He had felt these nodules going back around 8 months and had some what is described as atypical left-sided chest pain. CT neck of the.of the neck was performed showing cervical or supra clavicular adenopathy as well as left upper lobe lung mass. Patient has 100 pack year smoking history. He went on to [facility] PET CT scan showing hypermetabolic activity in the left upper lobe mass with involvement of the left hilum superior segment left lower lobe as well as mediastinal right hilar and supraclavicular adenopathy as well as cervical adenopathy.he underwent a left cervical lymph node biopsy which was positive for metastatic adenocarcinoma consistent with lung origin. Patient is seen medical oncology and was presented at our weekly tumor conference where concurrent chemoradiation was recommended. Patient is slated for port placement tomorrow. He is doing fairly well today continues to have slightly decreased appetite he states he's been having some right shoulder pain although that is improved. He specifically denies hemoptysis  PLANNED TREATMENT REGIMEN: concurrent chemoradiation  PAST MEDICAL HISTORY:  has a past medical history of Anxiety;  Arthritis; Cataract; Colon cancer ([place]) (2015); Depression; Dyspnea; Enlarged prostate; and Pain.    PAST SURGICAL HISTORY:  Past Surgical History:  Procedure Laterality Date  . CATARACT EXTRACTION W/ INTRAOCULAR LENS  IMPLANT, BILATERAL    . COLONOSCOPY    . EYE SURGERY    . LYMPH GLAND EXCISION N/A 02/09/2017   Procedure: CERVICAL LYMPH NODE BIOPSY;  Surgeon: Robert Bellow, MD;  Location: [facility] ORS;  Service: General;  Laterality: N/A;  . PROSTATE SURGERY    . TONSILLECTOMY      FAMILY HISTORY: family history includes Breast cancer in his maternal grandmother; Dementia in his mother; Diabetes in his father; Stroke in his father.  SOCIAL HISTORY:  reports that he has been smoking Cigarettes.  He has a 53.00 pack-year smoking history. He has never used smokeless tobacco. He reports that he drinks alcohol. He reports that he does not use drugs.  ALLERGIES: Patient has no known allergies.  MEDICATIONS:  Current Outpatient Prescriptions  Medication Sig Dispense Refill  . aspirin 325 MG tablet Take 650 mg by mouth daily.     . Chlorpheniramine-Phenylephrine (SINUS & ALLERGY) 4-10 MG tablet Take 1 tablet by mouth daily as needed.     . Multiple Vitamin (MULTI-VITAMINS) TABS Take 1 tablet by mouth daily.      No current facility-administered medications for this encounter.     ECOG PERFORMANCE STATUS:  1 - Symptomatic but completely ambulatory  REVIEW OF SYSTEMS: except for the atypical chest pain and right shoulder pain Patient denies any weight loss, fatigue, weakness, fever, chills or night sweats. Patient denies any loss of vision, blurred vision. Patient denies any  ringing  of the ears or hearing loss. No irregular heartbeat. Patient denies heart murmur or history of fainting. Patient denies any chest pain or pain radiating to her upper extremities. Patient denies any shortness of breath, difficulty breathing at night, cough or hemoptysis. Patient denies any swelling in the lower  legs. Patient denies any nausea vomiting, vomiting of blood, or coffee ground material in the vomitus. Patient denies any stomach pain. Patient states has had normal bowel movements no significant constipation or diarrhea. Patient denies any dysuria, hematuria or significant nocturia. Patient denies any problems walking, swelling in the joints or loss of balance. Patient denies any skin changes, loss of hair or loss of weight. Patient denies any excessive worrying or anxiety or significant depression. Patient denies any problems with insomnia. Patient denies excessive thirst, polyuria, polydipsia. Patient denies any swollen glands, patient denies easy bruising or easy bleeding. Patient denies any recent infections, allergies or URI. Patient "s visual fields have not changed significantly in recent time.    PHYSICAL EXAM: BP 132/74   Pulse 68   Temp (!) 97 F (36.1 C)   Wt 174 lb 4.4 oz (79.1 kg)   BMI 23.64 kg/m  Patient has lower cervical supraclavicular adenopathy which is prominent. Well-developed well-nourished patient in NAD. HEENT reveals PERLA, EOMI, discs not visualized.  Oral cavity is clear. No oral mucosal lesions are identified. Neck is clear without evidence of cervical or supraclavicular adenopathy. Lungs are clear to A&P. Cardiac examination is essentially unremarkable with regular rate and rhythm without murmur rub or thrill. Abdomen is benign with no organomegaly or masses noted. Motor sensory and DTR levels are equal and symmetric in the upper and lower extremities. Cranial nerves II through XII are grossly intact. Proprioception is intact. No peripheral adenopathy or edema is identified. No motor or sensory levels are noted. Crude visual fields are within normal range.  LABORATORY DATA: pathology reports reviewed    RADIOLOGY RESULTS:CT scans and PET/CT scans reviewed   IMPRESSION: at least stage IIIB possible stage IV adenocarcinoma of the left upper lobe in 71 year old  male.  PLAN: at this time I would recommend going ahead with concurrent chemoradiation. Based on the large size of his tumor and bilateral aspect would treat with a initial course of radiation therapy to 4140 cGy in 23 fractions using IM RT radiation therapy to spare critical structures such as esophagus spinal cord and heart and normal lung volume. Would try to cover supraclavicular fossa as well as some the lower cervical nodes with initial course of radiation. Would reevaluate after short 1 week treatment break for possibility small field boost. Risks and benefits of treatment including increased dysphasia from radiation esophagitis fatigue alteration of blood counts skin reaction cough all were described in detail to the patient and his wife. I personally set up and ordered CT simulation for early next week.There will be extra effort by both professional staff as well as technical staff to coordinate and manage concurrent chemoradiation and ensuing side effects during his treatments.  patient wife seem to comprehendmy treatment plan well.  I would like to take this opportunity to thank you for allowing me to participate in the care of your patient.Armstead Peaks., MD

## 2017-02-21 NOTE — Patient Instructions (Signed)
  Your procedure is scheduled on: 02/23/17 Report to Day Surgery. [facility] SECOND FLOOR To find out your arrival time please call [phone] between 1PM - 3PM on 02/22/17  Remember: Instructions that are not followed completely may result in serious medical risk, up to and including death, or upon the discretion of your surgeon and anesthesiologist your surgery may need to be rescheduled.    __X__ 1. Do not eat food after midnight the night before your procedure. No gum chewing or hard candies. You may drink clear liquids up to 2 hours before you are scheduled to arrive for your surgery- DO not drink clear liquids within 2 hours of the start of your surgery.  Clear Liquids include: water, apple juice without pulp, clear carbohydrate drink such as Clearfast of Gartorade, Black Coffee or Tea (Do not add anything to coffee or tea).    __X__ 2. No Alcohol for 24 hours before or after surgery.   _X___ 3. Do Not Smoke For 24 Hours Prior to Your Surgery.   ____ 4. Bring all medications with you on the day of surgery if instructed.    __X__ 5. Notify your doctor if there is any change in your medical condition     (cold, fever, infections).       Do not wear jewelry, make-up, hairpins, clips or nail polish.  Do not wear lotions, powders, or perfumes. You may wear deodorant.  Do not shave 48 hours prior to surgery. Men may shave face and neck.  Do not bring valuables to the hospital.    [facility] is not responsible for any belongings or valuables.               Contacts, dentures or bridgework may not be worn into surgery.  Leave your suitcase in the car. After surgery it may be brought to your room.  For patients admitted to the hospital, discharge time is determined by your                treatment team.   Patients discharged the day of surgery will not be allowed to drive home.     ____ Take these medicines the morning of surgery with A SIP OF WATER:     1.NONE  2.   3.   4.  5.  6.  ____ Fleet Enema (as directed)   ____ Use CHG Soap as directed  ____ Use inhalers on the day of surgery  ____ Stop metformin 2 days prior to surgery    ____ Take 1/2 of usual insulin dose the night before surgery and none on the morning of surgery.   __X__ Stop Coumadin/Plavix/aspirin on   ASPIRIN ON HOLD  ____ Stop Anti-inflammatories on   ____ Stop supplements until after surgery.    ____ Bring C-Pap to the hospital.

## 2017-02-21 NOTE — Progress Notes (Signed)
Patient here for follow up. No changes since last appointment.  

## 2017-02-21 NOTE — Progress Notes (Signed)
Hematology/Oncology Follow Up Note [facility] Telephone:(336) [phone] Fax:(336) [phone]  CONSULT NOTE Patient Care Team: [name], MD as PCP - General (Family Medicine) [name], [name], MD (General Surgery) [name], MD as Consulting Physician (Oncology) [name], RN as Registered Nurse  Reason for Visit: follow up s/p lymph node biopsy  HISTORY OF PRESENTING ILLNESS:  Marc Gray 71 y.o.  male who was referred by Dr.Hedrick Jeneen Rinks to see me for evaluation of neck mass.  Patient has felt a neck mass for about 7-8 months and lately also atypical chest pain and he went to see primary care physician Dr.Hedrick. CT neck was done which showed multiple cervical/supraclavical lymphadenopathy as well as left upper lobe mass.   Patient has 53 pack year smoking history and currently daily smoker  INTERVAL HISTORY Patient is accompanied by his wife and presents to discuss about the pathology results. No new complaints.    ROS:  Review of Systems  Constitutional: Negative.   HENT:  Negative.   Eyes: Negative.   Respiratory: Positive for cough.   Cardiovascular: Positive for chest pain.  Gastrointestinal: Negative.   Endocrine: Negative.   Genitourinary: Negative.    Musculoskeletal: Negative.   Skin: Negative.   Neurological: Negative.   Hematological: Negative.   Psychiatric/Behavioral: Negative.     MEDICAL HISTORY:  Past Medical History:  Diagnosis Date  . Anxiety   . Arthritis    SHOULDER  . Cataract   . Colon cancer ([place]) 2015   Pt states during his colonoscopy he had cancerous polyps removed.   . Depression    SINCE DIAGNOSIS  . Dyspnea    DOE  . Enlarged prostate   . Pain    DUE TO LUNG ISSUE    SURGICAL HISTORY: Past Surgical History:  Procedure Laterality Date  . CATARACT EXTRACTION W/ INTRAOCULAR LENS  IMPLANT, BILATERAL    . COLONOSCOPY    . EYE SURGERY    . LYMPH GLAND EXCISION N/A 02/09/2017   Procedure: CERVICAL LYMPH NODE BIOPSY;  Surgeon: Robert Bellow, MD;  Location: [facility] ORS;  Service: General;  Laterality: N/A;  . PROSTATE SURGERY    . TONSILLECTOMY      SOCIAL HISTORY: Social History   Social History  . Marital status: Married    Spouse name: N/A  . Number of children: N/A  . Years of education: N/A   Occupational History  . Not on file.   Social History Main Topics  . Smoking status: Current Every Day Smoker    Packs/day: 1.00    Years: 53.00    Types: Cigarettes  . Smokeless tobacco: Never Used  . Alcohol use Yes     Comment: occassional  . Drug use: No  . Sexual activity: Yes   Other Topics Concern  . Not on file   Social History Narrative  . No narrative on file    FAMILY HISTORY: Family History  Problem Relation Age of Onset  . Breast cancer Maternal Grandmother   . Dementia Mother   . Diabetes Father   . Stroke Father     ALLERGIES:  has No Known Allergies.  MEDICATIONS:  Current Outpatient Prescriptions  Medication Sig Dispense Refill  . aspirin 325 MG tablet Take 650 mg by mouth daily.     . Chlorpheniramine-Phenylephrine (SINUS & ALLERGY) 4-10 MG tablet Take 1 tablet by mouth daily as needed.     . Multiple Vitamin (MULTI-VITAMINS) TABS Take 1 tablet by mouth daily.  No current facility-administered medications for this visit.       [name]  PHYSICAL EXAMINATION: ECOG PERFORMANCE STATUS: 0 - Asymptomatic There were no vitals filed for this visit. There were no vitals filed for this visit.  GENERAL: Well-nourished well-developed; Alert, no distress and comfortable.  EYES: no pallor or icterus OROPHARYNX: no thrush or ulceration; good dentition  NECK: supple, no masses felt LYMPH: left supraclavicular lymph adenopathy, multiple other cervical lymph nodes.  LUNGS: clear to auscultation and  No wheeze or crackles HEART/CVS: regular rate & rhythm and no murmurs; No lower extremity edema ABDOMEN: abdomen soft, non-tender  and normal bowel sounds Musculoskeletal:no cyanosis of digits and no clubbing  PSYCH: alert & oriented x 3  NEURO: no focal motor/sensory deficits SKIN:  no rashes or significant lesions  LABORATORY DATA:  I have reviewed the data as listed Lab Results  Component Value Date   WBC 8.8 02/02/2017   HGB 14.3 02/02/2017   HCT 41.5 02/02/2017   MCV 91.8 02/02/2017   PLT 220 02/02/2017    Recent Labs  02/02/17 1603  NA 135  K 4.3  CL 103  CO2 23  GLUCOSE 102*  BUN 22*  CREATININE 1.34*  CALCIUM 9.1  GFRNONAA 52*  GFRAA >60  PROT 7.2  ALBUMIN 3.7  AST 17  ALT 19  ALKPHOS 55  BILITOT 0.8    RADIOGRAPHIC STUDIES: I have personally reviewed the radiological images as listed and agreed with the findings in the report. Ct Soft Tissue Neck W Contrast  Result Date: 01/24/2017 CLINICAL DATA:  Left supraclavicular mass for 9 months. EXAM: CT NECK WITH CONTRAST TECHNIQUE: Multidetector CT imaging of the neck was performed using the standard protocol following the bolus administration of intravenous contrast. CONTRAST:  56mL ISOVUE-300 IOPAMIDOL (ISOVUE-300) INJECTION 61% COMPARISON:  None. FINDINGS: Pharynx and larynx: Symmetric pharyngeal soft tissues without evidence of mass. No fluid collection or significant inflammatory changes in the parapharyngeal or retropharyngeal spaces. Unremarkable larynx. Salivary glands: No inflammation, mass, or stone. Thyroid: Unremarkable. Lymph nodes: Multiple enlarged, rounded left supraclavicular lymph nodes measure up to 12 mm in short axis. An abnormal left level V lymph node more superiorly in the left lateral neck measures 9 mm. An 8 mm short axis left level Gray lymph node has an abnormal rounded configuration. An enlarged left level Gray lymph node more inferiorly measures 13 mm, while left level IV lymph nodes measure 10 mm. There is a 10 mm lymph node in the right tracheoesophageal groove near the thoracic inlet. A low right level Gray lymph node  measures 8 mm, and there is a 6 mm right level Gray lymph node more superiorly. Vascular: Major vascular structures of the neck are patent. Mild right greater than left cervical carotid artery atherosclerosis. Mild thoracic aortic atherosclerosis. Limited intracranial: Unremarkable. Visualized orbits: Bilateral cataract extraction. Mastoids and visualized paranasal sinuses: Partially visualized right frontal sinus opacification. Mild right ethmoid air cell mucosal thickening. Clear mastoid air cells. Cerumen in the left EAC. Skeleton: No suspicious lytic or blastic osseous lesion. Moderate lower cervical disc degeneration, greatest at C6-7. Upper chest: Multiple enlarged mediastinal lymph nodes including a 2.1 cm low right paratracheal node and a 1.5 cm prevascular node. Large left upper lobe mass extending to the pleural surface in the anterior/apical left upper lobe. The mass measures greater than 10 cm in craniocaudal length. The mass extends inferiorly to the left hilum where there is soft tissue encasing the hilar vessels as well as extending posteriorly into  the superior segment of the left lower lobe. The hilar component was incompletely imaged. There is centrilobular emphysema. Other: 1.5 cm subcutaneous low-density lesion in the posterior midline of the lower neck, likely a sebaceous cyst. IMPRESSION: 1. Partially visualized large left upper lobe lung mass with mediastinal and left greater than right lower neck/supraclavicular lymphadenopathy consistent with metastatic lung cancer. 2. Aortic Atherosclerosis (ICD10-I70.0) and Emphysema (ICD10-J43.9). These results will be called to the ordering clinician or representative by the Radiologist Assistant, and communication documented in the PACS or zVision Dashboard. Electronically Signed   By: Logan Bores M.D.   On: 01/24/2017 11:06   Mr Jeri Cos XA Contrast  Result Date: 02/12/2017 CLINICAL DATA:  Lung mass.  Staging EXAM: MRI HEAD WITHOUT AND WITH CONTRAST  TECHNIQUE: Multiplanar, multiecho pulse sequences of the brain and surrounding structures were obtained without and with intravenous contrast. CONTRAST:  15 mL MultiHance IV COMPARISON:  None. FINDINGS: Brain: Ventricle size normal. Cerebral volume normal for age. Scattered small hyperintensities in the subcortical white matter bilaterally. Small hyperintensity deep white matter on the left. Negative for acute infarct or hemorrhage. Negative for mass or edema. Normal enhancement postcontrast infusion. No enhancing mass lesion. Leptomeningeal enhancement normal. Vascular: Normal arterial flow void Skull and upper cervical spine: Negative Sinuses/Orbits: Mucosal edema in the paranasal sinuses. Bilateral cataract removal. Mastoid sinus clear Other: None IMPRESSION: Negative for metastatic disease Chronic microvascular ischemic changes in the white matter Sinus mucosal disease. Electronically Signed   By: Franchot Gallo M.D.   On: 02/12/2017 12:05   Nm Pet Image Initial (pi) Skull Base To Thigh  Result Date: 02/12/2017 CLINICAL DATA:  Initial treatment strategy for lung mass. EXAM: NUCLEAR MEDICINE PET SKULL BASE TO THIGH TECHNIQUE: 12.6 mCi F-18 FDG was injected intravenously. Full-ring PET imaging was performed from the skull base to thigh after the radiotracer. CT data was obtained and used for attenuation correction and anatomic localization. FASTING BLOOD GLUCOSE:  Value: 85 mg/dl COMPARISON:  CT neck 01/24/2017. FINDINGS: NECK: No hypermetabolic lymph nodes in the neck. CT images show no acute findings. CHEST: Left supraclavicular lymph nodes measure up to 12 mm (CT image 48) with an SUV max of 4.1. Low left internal jugular lymph node measures 13 mm (CT image 64) with an SUV max of 7.2. Lymph node posterior to the right clavicle measures 13 mm (CT image 63) with an SUV max of 5.5. There are hypermetabolic lymph nodes throughout the mediastinum and both hilar regions. Index low right paratracheal lymph node  measures 2.3 cm (CT image 84) with an SUV max of 9.3. Index right hilar hypermetabolism has an SUV max of 5.5. Correlation with CT is difficult due to lack of post-contrast imaging. Spiculated left upper lobe mass measures 4.8 x 6.0 cm with an SUV max of 9.0 and with extension to the left hilum and superior segment left lower lobe. Atherosclerotic calcification of the arterial vasculature, including coronary arteries. Heart is at the upper limits of normal in size to mildly enlarged. Small pericardial effusion. A few scattered patchy areas of peribronchovascular ground-glass are nonspecific and may be infectious or inflammatory in etiology. No pleural fluid. ABDOMEN/PELVIS: No abnormal hypermetabolism in the liver. Vague mild hypermetabolism in the right adrenal gland does not appear to have a CT correlate. No abnormal hypermetabolism in the left adrenal gland, spleen or pancreas. No hypermetabolic lymph nodes. CT images show the liver, gallbladder, adrenal glands and right kidney to be grossly unremarkable. Left kidney is atrophic. Spleen, pancreas, stomach  and bowel are grossly unremarkable. Gastrohepatic ligament lymph nodes are not enlarged by CT size criteria. Atherosclerotic calcification of the arterial vasculature without abdominal aortic aneurysm. TURP defect. SKELETON: No abnormal osseous hypermetabolism. Degenerative changes in the spine. IMPRESSION: 1. Hypermetabolic left upper lobe mass with involvement of the left hilum and superior segment left lower lobe as well as hypermetabolic mediastinal, right hilar and supraclavicular adenopathy, most consistent with primary bronchogenic carcinoma (at least T2bN3M0 or stage IIIB disease). 2. Vague hypermetabolism in the right adrenal gland without definite CT correlate. 3. Small pericardial effusion. 4. A few scattered areas of peribronchovascular ground-glass are nonspecific and may be infectious or inflammatory in etiology. Metastatic disease cannot be  definitively excluded. 5. Aortic atherosclerosis (ICD10-170.0). Coronary artery calcification. Electronically Signed   By: Lorin Picket M.D.   On: 02/12/2017 10:34    ASSESSMENT & PLAN:  Cancer Staging Adenocarcinoma of left lung, stage 3 ([place]) Staging form: Lung, AJCC 8th Edition - Clinical stage from 02/14/2017: Stage IIIB (cT2b, cN3, cM0) - Signed by [name], MD on 02/14/2017  1. Adenocarcinoma of left lung, stage 3 ([facility])   2. Cervical lymphadenopathy   3. Tobacco abuse counseling    Pathology was pending at the time patient is in the clinic. And I have discussed possibilities. He has been evaluated by Dr.Chrystal today and will have simulation next week and hopefully starting RT in about 10 days.   # I had a lengthy discussion pathology results, extent of the disease and prognosis. Pathology results of left supraclavicular lymph node revealed lung adenocarcinoma. Coming the PET scan findings, he has stage IIIB lung adenocarcinoma.  I recommend concurrent weekly chemotherapy Carbo Taxol with RT, followed by consolidation chemotherapy /  immunotherapy consolidation.   I explained to the patient the risks and benefits of Carboplatin and Taxol including all but not limited to mouth sore, nausea, vomiting, low blood counts, bleeding, decrease immunity and risk of life threatening infection and even death, secondary malignancy etc.  Risk of neuropathy is associated with Taxol .   I discussed the mechanism of action; The goal of therapy is Curative intent.   Discussed the potential side effects of immunotherapy including but not limited to diarrhea; skin rash; elevated LFTs/endocrine abnormalities etc. Will revisit when he is at the stage to start immunotherapy.   # port placement will be done this Friday. Patient understands that the medi port placement is for convenience for chemotherapy admission and blood draw. He has option to have chemotherapy being given via peripheral veins with risk of  extravasation can cause irritation. Patient prefers to have Medi port placed.   # smoke cessation is discussed with patient in detail. Will provide him information of supporting program for quitting smoking.   All questions were answered. The patient knows to call the clinic with any problems questions or concerns.  Return of visit: day 1 of concurrent carbo and taxol. Lab/MD/carbo and taxol.  Thank you for this kind referral and the opportunity to participate in the care of this patient. A copy of today's note is routed to referring provider Dr.Hedrick.    [name], MD, PhD Hematology Oncology [facility] Pager- [phone] 02/21/2017

## 2017-02-21 NOTE — Progress Notes (Signed)
  Oncology Nurse Navigator Documentation  Navigator Location: [facility] (02/21/17 1600) Referral date to RadOnc/MedOnc: 01/24/17 (02/21/17 1600) )Navigator Encounter Type: Initial RadOnc (02/21/17 1600)   Abnormal Finding Date: 01/24/17 (02/21/17 1600) Confirmed Diagnosis Date: 02/14/17 (02/21/17 1600)               Patient Visit Type: MedOnc;RadOnc (02/21/17 1600) Treatment Phase: Pre-Tx/Tx Discussion (02/21/17 1600) Barriers/Navigation Needs: Coordination of Care (02/21/17 1600) Education: Understanding Cancer/ Treatment Options (02/21/17 1600) Interventions: Coordination of Care (02/21/17 1600)   Coordination of Care: Appts (02/21/17 1600)     met with patient during initial radiation oncology consult with Dr. [name]. All questions answered at the time of visit. Assisted pt in coordinating appt for chemo start with radiation treatments. Upcoming appts reviewed with patient. Nothing further needed at this time. Pt verbalized understanding to call with any questions or needs.             Time Spent with Patient: 60 (02/21/17 1600)

## 2017-02-22 MED ORDER — CEFAZOLIN SODIUM-DEXTROSE 2-4 GM/100ML-% IV SOLN
2.0000 g | INTRAVENOUS | Status: AC
  Administered 2017-02-23: 2 g via INTRAVENOUS

## 2017-02-23 ENCOUNTER — Ambulatory Visit: Payer: PPO | Admitting: Anesthesiology

## 2017-02-23 ENCOUNTER — Encounter
Admission: RE | Disposition: A | Discharge status: Home / Self Care | Payer: Self-pay | Source: Ambulatory Visit | Attending: General Surgery

## 2017-02-23 ENCOUNTER — Ambulatory Visit
Admission: RE | Admit: 2017-02-23 | Discharge: 2017-02-23 | Disposition: A | Discharge status: Home / Self Care | Payer: PPO | Source: Ambulatory Visit | Attending: General Surgery | Admitting: General Surgery

## 2017-02-23 ENCOUNTER — Ambulatory Visit: Payer: PPO

## 2017-02-23 DIAGNOSIS — F17210 Nicotine dependence, cigarettes, uncomplicated: Secondary | ICD-10-CM | POA: Diagnosis not present

## 2017-02-23 DIAGNOSIS — Z85038 Personal history of other malignant neoplasm of large intestine: Secondary | ICD-10-CM | POA: Diagnosis not present

## 2017-02-23 DIAGNOSIS — F329 Major depressive disorder, single episode, unspecified: Secondary | ICD-10-CM | POA: Diagnosis not present

## 2017-02-23 DIAGNOSIS — Z7982 Long term (current) use of aspirin: Secondary | ICD-10-CM | POA: Insufficient documentation

## 2017-02-23 DIAGNOSIS — F419 Anxiety disorder, unspecified: Secondary | ICD-10-CM | POA: Diagnosis not present

## 2017-02-23 DIAGNOSIS — N400 Enlarged prostate without lower urinary tract symptoms: Secondary | ICD-10-CM | POA: Insufficient documentation

## 2017-02-23 DIAGNOSIS — Z86010 Personal history of colonic polyps: Secondary | ICD-10-CM | POA: Diagnosis not present

## 2017-02-23 DIAGNOSIS — R0789 Other chest pain: Secondary | ICD-10-CM | POA: Diagnosis not present

## 2017-02-23 DIAGNOSIS — Z4682 Encounter for fitting and adjustment of non-vascular catheter: Secondary | ICD-10-CM | POA: Diagnosis not present

## 2017-02-23 DIAGNOSIS — M1990 Unspecified osteoarthritis, unspecified site: Secondary | ICD-10-CM | POA: Diagnosis not present

## 2017-02-23 DIAGNOSIS — R591 Generalized enlarged lymph nodes: Secondary | ICD-10-CM | POA: Insufficient documentation

## 2017-02-23 DIAGNOSIS — Z95828 Presence of other vascular implants and grafts: Secondary | ICD-10-CM

## 2017-02-23 DIAGNOSIS — C3492 Malignant neoplasm of unspecified part of left bronchus or lung: Secondary | ICD-10-CM | POA: Diagnosis not present

## 2017-02-23 DIAGNOSIS — C3490 Malignant neoplasm of unspecified part of unspecified bronchus or lung: Secondary | ICD-10-CM

## 2017-02-23 DIAGNOSIS — C7802 Secondary malignant neoplasm of left lung: Secondary | ICD-10-CM | POA: Diagnosis not present

## 2017-02-23 HISTORY — PX: PORTACATH PLACEMENT: SHX2246

## 2017-02-23 HISTORY — DX: Unspecified osteoarthritis, unspecified site: M19.90

## 2017-02-23 SURGERY — INSERTION, TUNNELED CENTRAL VENOUS DEVICE, WITH PORT
Anesthesia: Monitor Anesthesia Care | Laterality: Right | Wound class: Clean

## 2017-02-23 MED ORDER — PROPOFOL 10 MG/ML IV BOLUS
INTRAVENOUS | Status: DC | PRN
  Administered 2017-02-23: 50 mg via INTRAVENOUS

## 2017-02-23 MED ORDER — ONDANSETRON HCL 4 MG/2ML IJ SOLN: 4.0000 mg | Freq: Once | INTRAMUSCULAR | Status: DC | PRN

## 2017-02-23 MED ORDER — FENTANYL CITRATE (PF) 100 MCG/2ML IJ SOLN: 25.0000 ug | INTRAMUSCULAR | Status: DC | PRN

## 2017-02-23 MED ORDER — GLYCOPYRROLATE 0.2 MG/ML IJ SOLN
INTRAMUSCULAR | Status: AC
  Filled 2017-02-23: qty 1

## 2017-02-23 MED ORDER — SODIUM CHLORIDE 0.9 % IJ SOLN
INTRAMUSCULAR | Status: DC | PRN
  Administered 2017-02-23: 24 mL via INTRAVENOUS

## 2017-02-23 MED ORDER — FAMOTIDINE 20 MG PO TABS
20.0000 mg | ORAL_TABLET | Freq: Once | ORAL | Status: AC
  Administered 2017-02-23: 20 mg via ORAL

## 2017-02-23 MED ORDER — PROPOFOL 500 MG/50ML IV EMUL
INTRAVENOUS | Status: AC
  Filled 2017-02-23: qty 50

## 2017-02-23 MED ORDER — MIDAZOLAM HCL 2 MG/2ML IJ SOLN
INTRAMUSCULAR | Status: AC
  Filled 2017-02-23: qty 2

## 2017-02-23 MED ORDER — LACTATED RINGERS IV SOLN
INTRAVENOUS | Status: DC
  Administered 2017-02-23: 10:00:00 via INTRAVENOUS

## 2017-02-23 MED ORDER — FENTANYL CITRATE (PF) 100 MCG/2ML IJ SOLN
INTRAMUSCULAR | Status: AC
  Filled 2017-02-23: qty 2

## 2017-02-23 MED ORDER — SODIUM CHLORIDE 0.9 % IJ SOLN
INTRAMUSCULAR | Status: AC
  Filled 2017-02-23: qty 50

## 2017-02-23 MED ORDER — FAMOTIDINE 20 MG PO TABS
ORAL_TABLET | ORAL | Status: AC
  Administered 2017-02-23: 20 mg via ORAL
  Filled 2017-02-23: qty 1

## 2017-02-23 MED ORDER — LIDOCAINE HCL 2 % EX GEL
CUTANEOUS | Status: AC
  Filled 2017-02-23: qty 5

## 2017-02-23 MED ORDER — CEFAZOLIN SODIUM-DEXTROSE 2-4 GM/100ML-% IV SOLN
INTRAVENOUS | Status: AC
  Filled 2017-02-23: qty 100

## 2017-02-23 MED ORDER — LIDOCAINE HCL (PF) 1 % IJ SOLN
INTRAMUSCULAR | Status: AC
  Filled 2017-02-23: qty 30

## 2017-02-23 MED ORDER — LIDOCAINE HCL (CARDIAC) 20 MG/ML IV SOLN
INTRAVENOUS | Status: DC | PRN
  Administered 2017-02-23: 40 mg via INTRAVENOUS

## 2017-02-23 MED ORDER — LIDOCAINE HCL 2 % EX GEL
CUTANEOUS | Status: DC | PRN
  Administered 2017-02-23: 1 via TOPICAL

## 2017-02-23 MED ORDER — LIDOCAINE HCL (PF) 2 % IJ SOLN
INTRAMUSCULAR | Status: AC
  Filled 2017-02-23: qty 4

## 2017-02-23 MED ORDER — GLYCOPYRROLATE 0.2 MG/ML IJ SOLN
INTRAMUSCULAR | Status: DC | PRN
  Administered 2017-02-23: 0.2 mg via INTRAVENOUS

## 2017-02-23 MED ORDER — MIDAZOLAM HCL 2 MG/2ML IJ SOLN
INTRAMUSCULAR | Status: DC | PRN
  Administered 2017-02-23: 2 mg via INTRAVENOUS

## 2017-02-23 MED ORDER — FENTANYL CITRATE (PF) 100 MCG/2ML IJ SOLN
INTRAMUSCULAR | Status: DC | PRN
  Administered 2017-02-23 (×3): 25 ug via INTRAVENOUS

## 2017-02-23 MED ORDER — LIDOCAINE HCL (PF) 1 % IJ SOLN
INTRAMUSCULAR | Status: DC | PRN
  Administered 2017-02-23: 10 mL

## 2017-02-23 MED ORDER — PROPOFOL 500 MG/50ML IV EMUL
INTRAVENOUS | Status: DC | PRN
  Administered 2017-02-23: 150 ug/kg/min via INTRAVENOUS

## 2017-02-23 SURGICAL SUPPLY — 29 items
BLADE SURG 15 STRL SS SAFETY (BLADE) ×3 IMPLANT
CHLORAPREP W/TINT 26ML (MISCELLANEOUS) ×3 IMPLANT
CLOSURE WOUND 1/2 X4 (GAUZE/BANDAGES/DRESSINGS) ×1
COVER LIGHT HANDLE STERIS (MISCELLANEOUS) ×6 IMPLANT
DECANTER SPIKE VIAL GLASS SM (MISCELLANEOUS) ×6 IMPLANT
DRAPE C-ARM XRAY 36X54 (DRAPES) ×3 IMPLANT
DRAPE LAPAROTOMY TRNSV 106X77 (MISCELLANEOUS) ×3 IMPLANT
DRSG TEGADERM 2-3/8X2-3/4 SM (GAUZE/BANDAGES/DRESSINGS) ×3 IMPLANT
DRSG TEGADERM 4X4.75 (GAUZE/BANDAGES/DRESSINGS) ×3 IMPLANT
DRSG TELFA 4X3 1S NADH ST (GAUZE/BANDAGES/DRESSINGS) ×3 IMPLANT
ELECT REM PT RETURN 9FT ADLT (ELECTROSURGICAL) ×3
ELECTRODE REM PT RTRN 9FT ADLT (ELECTROSURGICAL) ×1 IMPLANT
GLOVE BIO SURGEON STRL SZ7.5 (GLOVE) ×3 IMPLANT
GLOVE INDICATOR 8.0 STRL GRN (GLOVE) ×3 IMPLANT
GOWN STRL REUS W/ TWL LRG LVL3 (GOWN DISPOSABLE) ×2 IMPLANT
GOWN STRL REUS W/TWL LRG LVL3 (GOWN DISPOSABLE) ×4
KIT PORT POWER 8FR ISP CVUE (Miscellaneous) ×3 IMPLANT
KIT RM TURNOVER STRD PROC AR (KITS) ×3 IMPLANT
LABEL OR SOLS (LABEL) ×3 IMPLANT
NS IRRIG 500ML POUR BTL (IV SOLUTION) ×3 IMPLANT
PACK PORT-A-CATH (MISCELLANEOUS) ×3 IMPLANT
STRIP CLOSURE SKIN 1/2X4 (GAUZE/BANDAGES/DRESSINGS) ×2 IMPLANT
SUT PROLENE 3 0 SH DA (SUTURE) ×3 IMPLANT
SUT VIC AB 3-0 SH 27 (SUTURE) ×2
SUT VIC AB 3-0 SH 27X BRD (SUTURE) ×1 IMPLANT
SUT VIC AB 4-0 FS2 27 (SUTURE) ×3 IMPLANT
SWABSTK COMLB BENZOIN TINCTURE (MISCELLANEOUS) ×3 IMPLANT
SYR 10ML SLIP (SYRINGE) ×3 IMPLANT
SYRINGE 10CC LL (SYRINGE) ×3 IMPLANT

## 2017-02-23 NOTE — Anesthesia Postprocedure Evaluation (Signed)
Anesthesia Post Note  Patient: Marc Gray  Procedure(s) Performed: Procedure(s) (LRB): INSERTION PORT-A-CATH (Right)  Patient location during evaluation: PACU Anesthesia Type: General Level of consciousness: awake and alert and oriented Pain management: pain level controlled Vital Signs Assessment: post-procedure vital signs reviewed and stable Respiratory status: spontaneous breathing Cardiovascular status: blood pressure returned to baseline Anesthetic complications: no     Last Vitals:  Vitals:   02/23/17 1310 02/23/17 1404  BP: (!) 156/82 120/81  Pulse: (!) 58 (!) 51  Resp: 16 15  Temp: (!) 36.2 C   SpO2: 97% 96%    Last Pain:  Vitals:   02/23/17 1310  TempSrc: Temporal  PainSc:                  Ayomide Purdy

## 2017-02-23 NOTE — Transfer of Care (Signed)
Immediate Anesthesia Transfer of Care Note  Patient: Marc Gray  Procedure(s) Performed: Procedure(s): INSERTION PORT-A-CATH (Right)  Patient Location: PACU  Anesthesia Type:General  Level of Consciousness: drowsy and patient cooperative  Airway & Oxygen Therapy: Patient Spontanous Breathing and Patient connected to face mask oxygen  Post-op Assessment: Report given to RN and Post -op Vital signs reviewed and stable  Post vital signs: Reviewed and stable  Last Vitals:  Vitals:   02/23/17 1008 02/23/17 1223  BP: (!) 141/86 110/73  Pulse: 71 71  Resp: 18 12  Temp: (!) 35.4 C   SpO2: 98% 100%    Last Pain:  Vitals:   02/23/17 1008  TempSrc: Oral  PainSc: 0-No pain         Complications: No apparent anesthesia complications

## 2017-02-23 NOTE — Discharge Instructions (Signed)

## 2017-02-23 NOTE — Anesthesia Preprocedure Evaluation (Signed)
Anesthesia Evaluation  Patient identified by MRN, date of birth, ID band Patient awake    Reviewed: Allergy & Precautions, NPO status , Patient's Chart, lab work & pertinent test results  Airway Mallampati: II  TM Distance: >3 FB     Dental   Pulmonary shortness of breath and with exertion, Current Smoker,    Pulmonary exam normal        Cardiovascular negative cardio ROS Normal cardiovascular exam     Neuro/Psych Anxiety Depression negative neurological ROS  negative psych ROS   GI/Hepatic Neg liver ROS, Colon polyps   Endo/Other  negative endocrine ROS  Renal/GU negative Renal ROS  negative genitourinary   Musculoskeletal negative musculoskeletal ROS (+) Arthritis , Osteoarthritis,    Abdominal Normal abdominal exam  (+)   Peds negative pediatric ROS (+)  Hematology negative hematology ROS (+)   Anesthesia Other Findings Past Medical History: No date: Cataract 2015: Colon cancer ([place])     Comment:  Pt states during his colonoscopy he had cancerous polyps              removed.  No date: Enlarged prostate  Upper lobe lung mass 53 pack year Hx of smoking  Reproductive/Obstetrics                             Anesthesia Physical  Anesthesia Plan  ASA: II  Anesthesia Plan: General and MAC   Post-op Pain Management:    Induction: Intravenous  PONV Risk Score and Plan:   Airway Management Planned: Nasal Cannula  Additional Equipment:   Intra-op Plan:   Post-operative Plan:   Informed Consent: I have reviewed the patients History and Physical, chart, labs and discussed the procedure including the risks, benefits and alternatives for the proposed anesthesia with the patient or authorized representative who has indicated his/her understanding and acceptance.   Dental advisory given  Plan Discussed with: Surgeon and CRNA  Anesthesia Plan Comments:         Anesthesia  Quick Evaluation

## 2017-02-23 NOTE — Anesthesia Procedure Notes (Signed)
Date/Time: 02/23/2017 11:37 AM Performed by: Doreen Salvage Pre-anesthesia Checklist: Patient identified, Emergency Drugs available, Suction available and Patient being monitored Patient Re-evaluated:Patient Re-evaluated prior to induction Oxygen Delivery Method: Simple face mask Induction Type: IV induction Dental Injury: Teeth and Oropharynx as per pre-operative assessment

## 2017-02-23 NOTE — Op Note (Signed)
Preoperative diagnosis: Metastatic lung cancer, central venous access requested for chemotherapy.  Postoperative diagnosis: Same.  Operative procedure: Placement right internal jugular power port for chemotherapy.  Operating surgeons: Lynnette Caffey, M.D.  Anesthesia: Attended local, 10 cc 1% plain Xylocaine.  Estimated blood loss: Minimal.  Clinical note: This 71 year old male was recently found with metastatic adenocarcinoma of lung. Central venous access was requested for administration of chemotherapy.  Operative note: The patient received Kefzol prior to the procedure. Hair was removed from the chest with clippers. He was placed into the mild Trendelenburg position the right neck and chest prepped with ChloraPrep and draped. Internal jugular location chosen due to the patient's significant pulmonary disease. The internal jugular vein was noted to be patent on ultrasound examination. One percent Xylocaine was instilled to the area and a 1 cm incision made. The vein was cannulated under ultrasound guidance. Guidewire was passed followed by the introducer and the catheter itself. Under fluoroscopy the catheter was positioned at the junction of the SVC and right atrium. This was tunneled to a pocket on the right anterior chest. The port was affixed and easily irrigated and aspirated. It was attached to the deep tissue with 3-0 Prolene sutures 2. The wound was closed with 3-0 Vicryl running suture to the adipose layer and a 4-0 Vicryl running subcuticular suture for the skin. Benzoin Steri-Strips were applied followed by Telfa and Tegaderm dressing.  Erect chest x-ray obtained in the recovery room showed the catheter tip at the junction of the SVC and right atrium. No evidence of pneumothorax.

## 2017-02-23 NOTE — Anesthesia Post-op Follow-up Note (Signed)
Anesthesia QCDR form completed.        

## 2017-02-23 NOTE — H&P (Signed)
No change in clinical condition or exam. For right power port placement.

## 2017-02-24 ENCOUNTER — Encounter: Payer: Self-pay | Admitting: General Surgery

## 2017-02-27 ENCOUNTER — Ambulatory Visit
Admission: RE | Admit: 2017-02-27 | Discharge: 2017-02-27 | Disposition: A | Discharge status: Home / Self Care | Payer: PPO | Source: Ambulatory Visit | Attending: Radiation Oncology | Admitting: Radiation Oncology

## 2017-02-27 ENCOUNTER — Other Ambulatory Visit: Payer: Self-pay | Admitting: *Deleted

## 2017-02-27 DIAGNOSIS — C778 Secondary and unspecified malignant neoplasm of lymph nodes of multiple regions: Secondary | ICD-10-CM | POA: Diagnosis not present

## 2017-02-27 DIAGNOSIS — F17210 Nicotine dependence, cigarettes, uncomplicated: Secondary | ICD-10-CM | POA: Diagnosis not present

## 2017-02-27 DIAGNOSIS — C3412 Malignant neoplasm of upper lobe, left bronchus or lung: Secondary | ICD-10-CM | POA: Diagnosis not present

## 2017-02-27 DIAGNOSIS — Z510 Encounter for antineoplastic radiation therapy: Secondary | ICD-10-CM | POA: Diagnosis not present

## 2017-02-27 MED ORDER — LIDOCAINE-PRILOCAINE 2.5-2.5 % EX CREA: TOPICAL_CREAM | CUTANEOUS | 1 refills | Status: DC

## 2017-02-27 NOTE — Telephone Encounter (Signed)
Emla cream sent to pharmacy as requested

## 2017-03-01 DIAGNOSIS — C3412 Malignant neoplasm of upper lobe, left bronchus or lung: Secondary | ICD-10-CM | POA: Diagnosis not present

## 2017-03-01 DIAGNOSIS — F17210 Nicotine dependence, cigarettes, uncomplicated: Secondary | ICD-10-CM | POA: Diagnosis not present

## 2017-03-01 DIAGNOSIS — C778 Secondary and unspecified malignant neoplasm of lymph nodes of multiple regions: Secondary | ICD-10-CM | POA: Diagnosis not present

## 2017-03-01 DIAGNOSIS — Z510 Encounter for antineoplastic radiation therapy: Secondary | ICD-10-CM | POA: Diagnosis not present

## 2017-03-05 ENCOUNTER — Other Ambulatory Visit: Payer: Self-pay | Admitting: *Deleted

## 2017-03-05 DIAGNOSIS — C3492 Malignant neoplasm of unspecified part of left bronchus or lung: Secondary | ICD-10-CM

## 2017-03-06 ENCOUNTER — Encounter: Payer: Self-pay | Admitting: Oncology

## 2017-03-06 ENCOUNTER — Inpatient Hospital Stay: Payer: PPO

## 2017-03-06 ENCOUNTER — Other Ambulatory Visit: Payer: Self-pay | Admitting: Oncology

## 2017-03-06 ENCOUNTER — Inpatient Hospital Stay: Payer: PPO | Attending: Oncology | Admitting: Oncology

## 2017-03-06 VITALS — BP 138/80 | HR 57

## 2017-03-06 VITALS — BP 118/73 | HR 71 | Temp 97.1°F | Wt 170.3 lb

## 2017-03-06 DIAGNOSIS — Z803 Family history of malignant neoplasm of breast: Secondary | ICD-10-CM

## 2017-03-06 DIAGNOSIS — K219 Gastro-esophageal reflux disease without esophagitis: Secondary | ICD-10-CM | POA: Insufficient documentation

## 2017-03-06 DIAGNOSIS — C770 Secondary and unspecified malignant neoplasm of lymph nodes of head, face and neck: Secondary | ICD-10-CM | POA: Insufficient documentation

## 2017-03-06 DIAGNOSIS — C3492 Malignant neoplasm of unspecified part of left bronchus or lung: Secondary | ICD-10-CM

## 2017-03-06 DIAGNOSIS — R590 Localized enlarged lymph nodes: Secondary | ICD-10-CM | POA: Diagnosis not present

## 2017-03-06 DIAGNOSIS — F418 Other specified anxiety disorders: Secondary | ICD-10-CM | POA: Diagnosis not present

## 2017-03-06 DIAGNOSIS — I700 Atherosclerosis of aorta: Secondary | ICD-10-CM | POA: Diagnosis not present

## 2017-03-06 DIAGNOSIS — Z86010 Personal history of colonic polyps: Secondary | ICD-10-CM | POA: Diagnosis not present

## 2017-03-06 DIAGNOSIS — Z5111 Encounter for antineoplastic chemotherapy: Secondary | ICD-10-CM | POA: Insufficient documentation

## 2017-03-06 DIAGNOSIS — F17210 Nicotine dependence, cigarettes, uncomplicated: Secondary | ICD-10-CM | POA: Insufficient documentation

## 2017-03-06 DIAGNOSIS — R5383 Other fatigue: Secondary | ICD-10-CM | POA: Insufficient documentation

## 2017-03-06 DIAGNOSIS — R12 Heartburn: Secondary | ICD-10-CM | POA: Diagnosis not present

## 2017-03-06 DIAGNOSIS — Z7982 Long term (current) use of aspirin: Secondary | ICD-10-CM | POA: Diagnosis not present

## 2017-03-06 DIAGNOSIS — Z79899 Other long term (current) drug therapy: Secondary | ICD-10-CM

## 2017-03-06 DIAGNOSIS — N400 Enlarged prostate without lower urinary tract symptoms: Secondary | ICD-10-CM | POA: Insufficient documentation

## 2017-03-06 DIAGNOSIS — C3412 Malignant neoplasm of upper lobe, left bronchus or lung: Secondary | ICD-10-CM | POA: Diagnosis not present

## 2017-03-06 DIAGNOSIS — M1990 Unspecified osteoarthritis, unspecified site: Secondary | ICD-10-CM | POA: Insufficient documentation

## 2017-03-06 DIAGNOSIS — Z23 Encounter for immunization: Secondary | ICD-10-CM | POA: Diagnosis not present

## 2017-03-06 DIAGNOSIS — Z85038 Personal history of other malignant neoplasm of large intestine: Secondary | ICD-10-CM | POA: Diagnosis not present

## 2017-03-06 DIAGNOSIS — R05 Cough: Secondary | ICD-10-CM | POA: Diagnosis not present

## 2017-03-06 DIAGNOSIS — K5900 Constipation, unspecified: Secondary | ICD-10-CM | POA: Diagnosis not present

## 2017-03-06 LAB — COMPREHENSIVE METABOLIC PANEL
ALBUMIN: 3.8 g/dL (ref 3.5–5.0)
ALT: 19 U/L (ref 17–63)
AST: 20 U/L (ref 15–41)
Alkaline Phosphatase: 57 U/L (ref 38–126)
Anion gap: 8 (ref 5–15)
BILIRUBIN TOTAL: 0.8 mg/dL (ref 0.3–1.2)
BUN: 19 mg/dL (ref 6–20)
CO2: 26 mmol/L (ref 22–32)
CREATININE: 1.15 mg/dL (ref 0.61–1.24)
Calcium: 9.1 mg/dL (ref 8.9–10.3)
Chloride: 104 mmol/L (ref 101–111)
GFR calc Af Amer: >60 mL/min (ref >60)
GLUCOSE: 131 mg/dL — AB (ref 65–99)
Potassium: 3.9 mmol/L (ref 3.5–5.1)
Sodium: 138 mmol/L (ref 135–145)
TOTAL PROTEIN: 7.3 g/dL (ref 6.5–8.1)

## 2017-03-06 LAB — CBC WITH DIFFERENTIAL/PLATELET
BASOS ABS: 0.1 10*3/uL (ref 0–0.1)
BASOS PCT: 1 %
Eosinophils Absolute: 1.2 10*3/uL — ABNORMAL HIGH (ref 0–0.7)
Eosinophils Relative: 13 %
HEMATOCRIT: 44 % (ref 40.0–52.0)
Hemoglobin: 15.2 g/dL (ref 13.0–18.0)
LYMPHS PCT: 16 %
Lymphs Abs: 1.4 10*3/uL (ref 1.0–3.6)
MCH: 31.7 pg (ref 26.0–34.0)
MCHC: 34.5 g/dL (ref 32.0–36.0)
MCV: 92 fL (ref 80.0–100.0)
Monocytes Absolute: 0.8 10*3/uL (ref 0.2–1.0)
Monocytes Relative: 9 %
NEUTROS ABS: 5.5 10*3/uL (ref 1.4–6.5)
Neutrophils Relative %: 61 %
Platelets: 191 10*3/uL (ref 150–440)
RBC: 4.78 MIL/uL (ref 4.40–5.90)
RDW: 14.3 % (ref 11.5–14.5)
WBC: 9.1 10*3/uL (ref 3.8–10.6)

## 2017-03-06 MED ORDER — SODIUM CHLORIDE 0.9 % IV SOLN
Freq: Once | INTRAVENOUS | Status: AC
  Administered 2017-03-06: 11:00:00 via INTRAVENOUS
  Filled 2017-03-06: qty 1000

## 2017-03-06 MED ORDER — DEXAMETHASONE SODIUM PHOSPHATE 100 MG/10ML IJ SOLN
20.0000 mg | Freq: Once | INTRAMUSCULAR | Status: AC
  Administered 2017-03-06: 20 mg via INTRAVENOUS
  Filled 2017-03-06: qty 2

## 2017-03-06 MED ORDER — SODIUM CHLORIDE 0.9 % IV SOLN
182.2000 mg | Freq: Once | INTRAVENOUS | Status: AC
  Administered 2017-03-06: 180 mg via INTRAVENOUS
  Filled 2017-03-06: qty 18

## 2017-03-06 MED ORDER — PACLITAXEL CHEMO INJECTION 300 MG/50ML
45.0000 mg/m2 | Freq: Once | INTRAVENOUS | Status: AC
  Administered 2017-03-06: 90 mg via INTRAVENOUS
  Filled 2017-03-06: qty 15

## 2017-03-06 MED ORDER — PALONOSETRON HCL INJECTION 0.25 MG/5ML
0.2500 mg | Freq: Once | INTRAVENOUS | Status: AC
  Administered 2017-03-06: 0.25 mg via INTRAVENOUS
  Filled 2017-03-06: qty 5

## 2017-03-06 MED ORDER — HEPARIN SOD (PORK) LOCK FLUSH 100 UNIT/ML IV SOLN
500.0000 [IU] | Freq: Once | INTRAVENOUS | Status: AC | PRN
  Administered 2017-03-06: 500 [IU]
  Filled 2017-03-06: qty 5

## 2017-03-06 MED ORDER — FAMOTIDINE IN NACL 20-0.9 MG/50ML-% IV SOLN
20.0000 mg | Freq: Once | INTRAVENOUS | Status: AC
  Administered 2017-03-06: 20 mg via INTRAVENOUS
  Filled 2017-03-06: qty 50

## 2017-03-06 MED ORDER — DIPHENHYDRAMINE HCL 50 MG/ML IJ SOLN
50.0000 mg | Freq: Once | INTRAMUSCULAR | Status: AC
  Administered 2017-03-06: 50 mg via INTRAVENOUS
  Filled 2017-03-06: qty 1

## 2017-03-06 NOTE — Progress Notes (Signed)
Patient here today for follow up.   

## 2017-03-06 NOTE — Progress Notes (Signed)
Hematology/Oncology Follow Up Note [facility] [phone] [phone]   Patient Care Team: [name], MD as PCP - General (Family Medicine) [name], [name], MD (General Surgery) [name], MD as Consulting Physician (Oncology) [name], RN as Registered Nurse  Reason for Visit: follow up for treatment of lung cancer  HISTORY OF PRESENTING ILLNESS:  Marc Gray 71 y.o.  male who presents for treatment of lung cancer Patient felt a neck mass for about 7-8 months and lately also atypical chest pain and he went to see primary care physician [place]. CT neck was done which showed multiple cervical/supraclavical lymphadenopathy as well as left upper lobe mass. 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   Colon CA was listed in his history, however reviewing his surgical pathology from colonoscopy on 04/15/2014, during which he had polyps removed and all are negative for cancer.   INTERVAL HISTORY Patient with above history is accompanied by his wife and presents for evaluation prior to concurrent chemoradiation therapy for his stage Gray B lung cancer.  During the interval he has had medi port placed, mild local soreness has resolved, otherwise doing well. He has chronic cough with clear sputum, which has not changed during the interval, there is no hemoptysis. Denies SOB, chest pain, abdominal pain. Patient has 53 pack year smoking history and currently daily smoker. He has decreased his daily cigarettes consumption from 2.5 pack to about 1 pack and plan to continue decrease numbers until he quits.    Review of Systems - Oncology Constitutional: Negative for fever, night sweats,unintentional weight loss, change in appetite. HENT: Negative for ear pain, hearing loss, nasal bleeding Eyes: Negative for eye pain, double vision   Respiratory: Negative for wheezing, shortness of breath, (+)  cough Cardiovascular: Negative for chest pain, palpitation.   Gastrointestinal: Negative abdominal pain, diarrhea, nausea vomiting Endocrine: Negative  Genitourinary: Negative for dysuria, hematuria, frequency Skin: Negative for rash, iching, bruising Neurological: Negative for headache, dizziness, seizure Hematological: Negative for easy bruising/bleeding, lymph node enlargement Psychiatric/Behavioral: Negative for depression, anxiety, suicidality  MEDICAL HISTORY:  Past Medical History:  Diagnosis Date  . Anxiety   . Arthritis    SHOULDER  . Cataract   . Colon cancer ([place]) 2015   Pt states during his colonoscopy he had cancerous polyps removed.   . Depression    SINCE DIAGNOSIS  . Dyspnea    DOE  . Enlarged prostate   . Pain    DUE TO LUNG ISSUE    SURGICAL HISTORY: Past Surgical History:  Procedure Laterality Date  . CATARACT EXTRACTION W/ INTRAOCULAR LENS  IMPLANT, BILATERAL    . COLONOSCOPY    . EYE SURGERY    . LYMPH GLAND EXCISION N/A 02/09/2017   Procedure: CERVICAL LYMPH NODE BIOPSY;  Surgeon: Robert Bellow, MD;  Location: [facility];  Service: General;  Laterality: N/A;  . PORTACATH PLACEMENT Right 02/23/2017   Procedure: INSERTION PORT-A-CATH;  Surgeon: Robert Bellow, MD;  Location: [facility];  Service: General;  Laterality: Right;  . PROSTATE SURGERY    . TONSILLECTOMY      SOCIAL HISTORY: Social History   Social History  . Marital status: Married    Spouse name: N/A  . Number of children: N/A  . Years of education: N/A   Occupational History  . Not on file.   Social History Main Topics  . Smoking status: Current Every Day Smoker    Packs/day: 1.00    Years:  53.00    Types: Cigarettes  . Smokeless tobacco: Never Used  . Alcohol use Yes     Comment: occassional  . Drug use: No  . Sexual activity: Yes   Other Topics Concern  . Not on file   Social History Narrative  . No narrative on file    FAMILY HISTORY: Family History   Problem Relation Age of Onset  . Breast cancer Maternal Grandmother   . Dementia Mother   . Diabetes Father   . Stroke Father     ALLERGIES:  has No Known Allergies.  MEDICATIONS:  Current Outpatient Prescriptions  Medication Sig Dispense Refill  . aspirin 325 MG tablet Take 650 mg by mouth daily.     . Chlorpheniramine-Phenylephrine (SINUS & ALLERGY) 4-10 MG tablet Take 1 tablet by mouth daily as needed.     . lidocaine-prilocaine (EMLA) cream Apply over port 1-2 hours prior to chemotherapy treatment.  Cover with plastic wrap. 30 g 1  . Multiple Vitamin (MULTI-VITAMINS) TABS Take 1 tablet by mouth daily.      No current facility-administered medications for this visit.       [name]  PHYSICAL EXAMINATION: ECOG PERFORMANCE STATUS: 0 - Asymptomatic Vitals:   03/06/17 0946  BP: 118/73  Pulse: 71  Temp: (!) 97.1 F (36.2 C)   Filed Weights   03/06/17 0946  Weight: 170 lb 5 oz (77.3 kg)  GENERAL: No distress, well nourished.  SKIN:  No rashes or significant lesions  HEAD: Normocephalic, No masses, lesions, tenderness or abnormalities  EYES: Conjunctiva are pink, non icteric ENT: External ears normal ,lips , buccal mucosa, and tongue normal and mucous membranes are moist  LYMPH: left supraclavicular lymph adenopathy.  LUNGS: Clear to auscultation, no crackles or wheezes HEART: Regular rate & rhythm, no murmurs, no gallops, S1 normal and S2 normal  ABDOMEN: Abdomen soft, non-tender, normal bowel sounds, I did not appreciate any  masses or organomegaly  MUSCULOSKELETAL: No CVA tenderness and no tenderness on percussion of the back or rib cage.  EXTREMITIES: No edema, no skin discoloration or tenderness NEURO: Alert & oriented, no focal motor/sensory deficits.  LABORATORY DATA:  I have reviewed the data as listed Lab Results  Component Value Date   WBC 8.8 02/02/2017   HGB 14.3 02/02/2017   HCT 41.5 02/02/2017   MCV 91.8 02/02/2017   PLT 220 02/02/2017    Recent Labs   02/02/17 1603  NA 135  K 4.3  CL 103  CO2 23  GLUCOSE 102*  BUN 22*  CREATININE 1.34*  CALCIUM 9.1  GFRNONAA 52*  GFRAA >60  PROT 7.2  ALBUMIN 3.7  AST 17  ALT 19  ALKPHOS 55  BILITOT 0.8    RADIOGRAPHIC STUDIES: I have personally reviewed the radiological images as listed and agreed with the findings in the report. Mr Jeri Cos Wo Contrast  Result Date: 02/12/2017 CLINICAL DATA:  Lung mass.  Staging EXAM: MRI HEAD WITHOUT AND WITH CONTRAST TECHNIQUE: Multiplanar, multiecho pulse sequences of the brain and surrounding structures were obtained without and with intravenous contrast. CONTRAST:  15 mL MultiHance IV COMPARISON:  None. FINDINGS: Brain: Ventricle size normal. Cerebral volume normal for age. Scattered small hyperintensities in the subcortical white matter bilaterally. Small hyperintensity deep white matter on the left. Negative for acute infarct or hemorrhage. Negative for mass or edema. Normal enhancement postcontrast infusion. No enhancing mass lesion. Leptomeningeal enhancement normal. Vascular: Normal arterial flow void Skull and upper cervical spine: Negative Sinuses/Orbits: Mucosal edema  in the paranasal sinuses. Bilateral cataract removal. Mastoid sinus clear Other: None IMPRESSION: Negative for metastatic disease Chronic microvascular ischemic changes in the white matter Sinus mucosal disease. Electronically Signed   By: Franchot Gallo M.D.   On: 02/12/2017 12:05   Nm Pet Image Initial (pi) Skull Base To Thigh  Result Date: 02/12/2017 CLINICAL DATA:  Initial treatment strategy for lung mass. EXAM: NUCLEAR MEDICINE PET SKULL BASE TO THIGH TECHNIQUE: 12.6 mCi F-18 FDG was injected intravenously. Full-ring PET imaging was performed from the skull base to thigh after the radiotracer. CT data was obtained and used for attenuation correction and anatomic localization. FASTING BLOOD GLUCOSE:  Value: 85 mg/dl COMPARISON:  CT neck 01/24/2017. FINDINGS: NECK: No hypermetabolic lymph  nodes in the neck. CT images show no acute findings. CHEST: Left supraclavicular lymph nodes measure up to 12 mm (CT image 48) with an SUV max of 4.1. Low left internal jugular lymph node measures 13 mm (CT image 64) with an SUV max of 7.2. Lymph node posterior to the right clavicle measures 13 mm (CT image 63) with an SUV max of 5.5. There are hypermetabolic lymph nodes throughout the mediastinum and both hilar regions. Index low right paratracheal lymph node measures 2.3 cm (CT image 84) with an SUV max of 9.3. Index right hilar hypermetabolism has an SUV max of 5.5. Correlation with CT is difficult due to lack of post-contrast imaging. Spiculated left upper lobe mass measures 4.8 x 6.0 cm with an SUV max of 9.0 and with extension to the left hilum and superior segment left lower lobe. Atherosclerotic calcification of the arterial vasculature, including coronary arteries. Heart is at the upper limits of normal in size to mildly enlarged. Small pericardial effusion. A few scattered patchy areas of peribronchovascular ground-glass are nonspecific and may be infectious or inflammatory in etiology. No pleural fluid. ABDOMEN/PELVIS: No abnormal hypermetabolism in the liver. Vague mild hypermetabolism in the right adrenal gland does not appear to have a CT correlate. No abnormal hypermetabolism in the left adrenal gland, spleen or pancreas. No hypermetabolic lymph nodes. CT images show the liver, gallbladder, adrenal glands and right kidney to be grossly unremarkable. Left kidney is atrophic. Spleen, pancreas, stomach and bowel are grossly unremarkable. Gastrohepatic ligament lymph nodes are not enlarged by CT size criteria. Atherosclerotic calcification of the arterial vasculature without abdominal aortic aneurysm. TURP defect. SKELETON: No abnormal osseous hypermetabolism. Degenerative changes in the spine. IMPRESSION: 1. Hypermetabolic left upper lobe mass with involvement of the left hilum and superior segment left  lower lobe as well as hypermetabolic mediastinal, right hilar and supraclavicular adenopathy, most consistent with primary bronchogenic carcinoma (at least T2bN3M0 or stage IIIB disease). 2. Vague hypermetabolism in the right adrenal gland without definite CT correlate. 3. Small pericardial effusion. 4. A few scattered areas of peribronchovascular ground-glass are nonspecific and may be infectious or inflammatory in etiology. Metastatic disease cannot be definitively excluded. 5. Aortic atherosclerosis (ICD10-170.0). Coronary artery calcification. Electronically Signed   By: Lorin Picket M.D.   On: 02/12/2017 10:34   Dg Chest Port 1 View  Result Date: 02/23/2017 CLINICAL DATA:  Post Port-A-Cath. History of colon cancer and lung cancer. Smoker. EXAM: PORTABLE CHEST 1 VIEW COMPARISON:  PET-CT 02/12/2017 FINDINGS: Placement of right IJ Port-A-Cath with tip overlying the SVC. Lungs are adequately inflated demonstrate patient's large known left upper lobe/hilar lung cancer. Minimal prominence of the perihilar markings. Borderline cardiomegaly. Minimal degenerate change of the spine. IMPRESSION: Known left upper lobe/hilar lung cancer. Right IJ  Port-A-Cath with tip over the SVC.  No pneumothorax. Electronically Signed   By: Marin Olp M.D.   On: 02/23/2017 12:44   Dg C-arm 1-60 Min-no Report  Result Date: 02/23/2017 Fluoroscopy was utilized by the requesting physician.  No radiographic interpretation.    ASSESSMENT & PLAN:  Cancer Staging Adenocarcinoma of left lung, stage 3 (HCC) Staging form: Lung, AJCC 8th Edition - Clinical stage from 02/14/2017: Stage IIIB (cT2b, cN3, cM0) - Signed by [name], MD on 02/14/2017  1. Adenocarcinoma of left lung, stage 3 (HCC)    I discussed the mechanism of action; The goal of therapy is Curative intent.   OK to proceed cycle 1 chemotherapy Carbo Taxol. His Radiation course plan to start on 03/08/2017. with RT, with last day of radiation scheduled as 11/12.   After he finished concurrent chemoRT, plan consolidation with Durvalumab. Patient asked about the rationale about consolidation treatment and also they have several questions about immunotherapy. I spent time explained to them.  Antiemetics, Emla cream sent to his pharmacy.   # smoke cessation is discussed with patient in detail. Provided him information of supporting program for quitting smoking.   All questions were answered. The patient knows to call the clinic with any problems questions or concerns. Return of visit: follow up on cycle 2 of concurrent carbo and taxol. Lab/MD/carbo and taxol.  He will be evaluated by my colleague prior to cycle 3, and 4 treatment. And I will see him on 11/6 for his cycle 5 treatment. Patient is aware [name], MD, PhD Hematology Oncology [facility] Pager- [phone] 03/06/2017

## 2017-03-07 ENCOUNTER — Ambulatory Visit
Admission: RE | Admit: 2017-03-07 | Discharge: 2017-03-07 | Disposition: A | Discharge status: Home / Self Care | Payer: PPO | Source: Ambulatory Visit | Attending: Radiation Oncology | Admitting: Radiation Oncology

## 2017-03-07 DIAGNOSIS — F17210 Nicotine dependence, cigarettes, uncomplicated: Secondary | ICD-10-CM | POA: Diagnosis not present

## 2017-03-07 DIAGNOSIS — Z510 Encounter for antineoplastic radiation therapy: Secondary | ICD-10-CM | POA: Diagnosis not present

## 2017-03-07 DIAGNOSIS — C3412 Malignant neoplasm of upper lobe, left bronchus or lung: Secondary | ICD-10-CM | POA: Diagnosis not present

## 2017-03-07 DIAGNOSIS — C778 Secondary and unspecified malignant neoplasm of lymph nodes of multiple regions: Secondary | ICD-10-CM | POA: Diagnosis not present

## 2017-03-08 ENCOUNTER — Ambulatory Visit
Admission: RE | Admit: 2017-03-08 | Discharge: 2017-03-08 | Disposition: A | Discharge status: Home / Self Care | Payer: PPO | Source: Ambulatory Visit | Attending: Radiation Oncology | Admitting: Radiation Oncology

## 2017-03-08 DIAGNOSIS — Z510 Encounter for antineoplastic radiation therapy: Secondary | ICD-10-CM | POA: Diagnosis not present

## 2017-03-08 DIAGNOSIS — F17210 Nicotine dependence, cigarettes, uncomplicated: Secondary | ICD-10-CM | POA: Diagnosis not present

## 2017-03-08 DIAGNOSIS — C778 Secondary and unspecified malignant neoplasm of lymph nodes of multiple regions: Secondary | ICD-10-CM | POA: Diagnosis not present

## 2017-03-08 DIAGNOSIS — C3412 Malignant neoplasm of upper lobe, left bronchus or lung: Secondary | ICD-10-CM | POA: Diagnosis not present

## 2017-03-09 ENCOUNTER — Ambulatory Visit
Admission: RE | Admit: 2017-03-09 | Discharge: 2017-03-09 | Disposition: A | Discharge status: Home / Self Care | Payer: PPO | Source: Ambulatory Visit | Attending: Radiation Oncology | Admitting: Radiation Oncology

## 2017-03-09 DIAGNOSIS — F17210 Nicotine dependence, cigarettes, uncomplicated: Secondary | ICD-10-CM | POA: Diagnosis not present

## 2017-03-09 DIAGNOSIS — C778 Secondary and unspecified malignant neoplasm of lymph nodes of multiple regions: Secondary | ICD-10-CM | POA: Diagnosis not present

## 2017-03-09 DIAGNOSIS — C3412 Malignant neoplasm of upper lobe, left bronchus or lung: Secondary | ICD-10-CM | POA: Diagnosis not present

## 2017-03-09 DIAGNOSIS — Z510 Encounter for antineoplastic radiation therapy: Secondary | ICD-10-CM | POA: Diagnosis not present

## 2017-03-12 ENCOUNTER — Ambulatory Visit
Admission: RE | Admit: 2017-03-12 | Discharge: 2017-03-12 | Disposition: A | Discharge status: Home / Self Care | Payer: PPO | Source: Ambulatory Visit | Attending: Radiation Oncology | Admitting: Radiation Oncology

## 2017-03-12 DIAGNOSIS — Z510 Encounter for antineoplastic radiation therapy: Secondary | ICD-10-CM | POA: Diagnosis not present

## 2017-03-12 DIAGNOSIS — C3412 Malignant neoplasm of upper lobe, left bronchus or lung: Secondary | ICD-10-CM | POA: Diagnosis not present

## 2017-03-12 DIAGNOSIS — F17210 Nicotine dependence, cigarettes, uncomplicated: Secondary | ICD-10-CM | POA: Diagnosis not present

## 2017-03-12 DIAGNOSIS — C778 Secondary and unspecified malignant neoplasm of lymph nodes of multiple regions: Secondary | ICD-10-CM | POA: Diagnosis not present

## 2017-03-13 ENCOUNTER — Ambulatory Visit
Admission: RE | Admit: 2017-03-13 | Discharge: 2017-03-13 | Disposition: A | Discharge status: Home / Self Care | Payer: PPO | Source: Ambulatory Visit | Attending: Radiation Oncology | Admitting: Radiation Oncology

## 2017-03-13 ENCOUNTER — Inpatient Hospital Stay: Payer: PPO

## 2017-03-13 ENCOUNTER — Encounter: Payer: Self-pay | Admitting: Oncology

## 2017-03-13 ENCOUNTER — Inpatient Hospital Stay (HOSPITAL_BASED_OUTPATIENT_CLINIC_OR_DEPARTMENT_OTHER): Payer: PPO | Admitting: Oncology

## 2017-03-13 VITALS — BP 127/75 | HR 70 | Temp 98.7°F | Wt 173.1 lb

## 2017-03-13 DIAGNOSIS — R590 Localized enlarged lymph nodes: Secondary | ICD-10-CM | POA: Diagnosis not present

## 2017-03-13 DIAGNOSIS — I700 Atherosclerosis of aorta: Secondary | ICD-10-CM

## 2017-03-13 DIAGNOSIS — F418 Other specified anxiety disorders: Secondary | ICD-10-CM | POA: Diagnosis not present

## 2017-03-13 DIAGNOSIS — C3412 Malignant neoplasm of upper lobe, left bronchus or lung: Secondary | ICD-10-CM

## 2017-03-13 DIAGNOSIS — N400 Enlarged prostate without lower urinary tract symptoms: Secondary | ICD-10-CM

## 2017-03-13 DIAGNOSIS — Z7982 Long term (current) use of aspirin: Secondary | ICD-10-CM | POA: Diagnosis not present

## 2017-03-13 DIAGNOSIS — C778 Secondary and unspecified malignant neoplasm of lymph nodes of multiple regions: Secondary | ICD-10-CM | POA: Diagnosis not present

## 2017-03-13 DIAGNOSIS — Z5111 Encounter for antineoplastic chemotherapy: Secondary | ICD-10-CM

## 2017-03-13 DIAGNOSIS — F17210 Nicotine dependence, cigarettes, uncomplicated: Secondary | ICD-10-CM | POA: Diagnosis not present

## 2017-03-13 DIAGNOSIS — K219 Gastro-esophageal reflux disease without esophagitis: Secondary | ICD-10-CM

## 2017-03-13 DIAGNOSIS — R05 Cough: Secondary | ICD-10-CM | POA: Diagnosis not present

## 2017-03-13 DIAGNOSIS — R12 Heartburn: Secondary | ICD-10-CM

## 2017-03-13 DIAGNOSIS — Z79899 Other long term (current) drug therapy: Secondary | ICD-10-CM

## 2017-03-13 DIAGNOSIS — C770 Secondary and unspecified malignant neoplasm of lymph nodes of head, face and neck: Secondary | ICD-10-CM | POA: Diagnosis not present

## 2017-03-13 DIAGNOSIS — C3492 Malignant neoplasm of unspecified part of left bronchus or lung: Secondary | ICD-10-CM

## 2017-03-13 DIAGNOSIS — Z510 Encounter for antineoplastic radiation therapy: Secondary | ICD-10-CM | POA: Diagnosis not present

## 2017-03-13 DIAGNOSIS — Z803 Family history of malignant neoplasm of breast: Secondary | ICD-10-CM

## 2017-03-13 DIAGNOSIS — Z85038 Personal history of other malignant neoplasm of large intestine: Secondary | ICD-10-CM

## 2017-03-13 DIAGNOSIS — Z86010 Personal history of colonic polyps: Secondary | ICD-10-CM

## 2017-03-13 DIAGNOSIS — Z716 Tobacco abuse counseling: Secondary | ICD-10-CM

## 2017-03-13 LAB — COMPREHENSIVE METABOLIC PANEL
ALT: 20 U/L (ref 17–63)
AST: 20 U/L (ref 15–41)
Albumin: 3.6 g/dL (ref 3.5–5.0)
Alkaline Phosphatase: 51 U/L (ref 38–126)
Anion gap: 8 (ref 5–15)
BILIRUBIN TOTAL: 0.8 mg/dL (ref 0.3–1.2)
BUN: 21 mg/dL — AB (ref 6–20)
CO2: 25 mmol/L (ref 22–32)
CREATININE: 1.17 mg/dL (ref 0.61–1.24)
Calcium: 9 mg/dL (ref 8.9–10.3)
Chloride: 101 mmol/L (ref 101–111)
GFR calc Af Amer: >60 mL/min (ref >60)
Glucose, Bld: 111 mg/dL — ABNORMAL HIGH (ref 65–99)
Potassium: 4 mmol/L (ref 3.5–5.1)
Sodium: 134 mmol/L — ABNORMAL LOW (ref 135–145)
TOTAL PROTEIN: 6.7 g/dL (ref 6.5–8.1)

## 2017-03-13 LAB — CBC WITH DIFFERENTIAL/PLATELET
BASOS ABS: 0.1 10*3/uL (ref 0–0.1)
Basophils Relative: 1 %
Eosinophils Absolute: 0.8 10*3/uL — ABNORMAL HIGH (ref 0–0.7)
Eosinophils Relative: 11 %
HEMATOCRIT: 41.4 % (ref 40.0–52.0)
Hemoglobin: 14.1 g/dL (ref 13.0–18.0)
LYMPHS ABS: 1.1 10*3/uL (ref 1.0–3.6)
LYMPHS PCT: 17 %
MCH: 31.2 pg (ref 26.0–34.0)
MCHC: 34 g/dL (ref 32.0–36.0)
MCV: 91.7 fL (ref 80.0–100.0)
MONO ABS: 0.5 10*3/uL (ref 0.2–1.0)
Monocytes Relative: 8 %
NEUTROS ABS: 4.3 10*3/uL (ref 1.4–6.5)
Neutrophils Relative %: 63 %
Platelets: 198 10*3/uL (ref 150–440)
RBC: 4.52 MIL/uL (ref 4.40–5.90)
RDW: 14 % (ref 11.5–14.5)
WBC: 6.8 10*3/uL (ref 3.8–10.6)

## 2017-03-13 MED ORDER — HEPARIN SOD (PORK) LOCK FLUSH 100 UNIT/ML IV SOLN
500.0000 [IU] | Freq: Once | INTRAVENOUS | Status: AC
  Administered 2017-03-13: 500 [IU] via INTRAVENOUS
  Filled 2017-03-13: qty 5

## 2017-03-13 MED ORDER — DIPHENHYDRAMINE HCL 50 MG/ML IJ SOLN
50.0000 mg | Freq: Once | INTRAMUSCULAR | Status: AC
  Administered 2017-03-13: 50 mg via INTRAVENOUS
  Filled 2017-03-13: qty 1

## 2017-03-13 MED ORDER — SODIUM CHLORIDE 0.9% FLUSH
10.0000 mL | INTRAVENOUS | Status: DC | PRN
  Administered 2017-03-13: 10 mL via INTRAVENOUS
  Filled 2017-03-13: qty 10

## 2017-03-13 MED ORDER — PROCHLORPERAZINE MALEATE 10 MG PO TABS: 10.0000 mg | ORAL_TABLET | Freq: Four times a day (QID) | ORAL | 2 refills | Status: DC | PRN

## 2017-03-13 MED ORDER — SODIUM CHLORIDE 0.9 % IV SOLN
20.0000 mg | Freq: Once | INTRAVENOUS | Status: AC
  Administered 2017-03-13: 20 mg via INTRAVENOUS
  Filled 2017-03-13: qty 2

## 2017-03-13 MED ORDER — ONDANSETRON HCL 8 MG PO TABS: 8.0000 mg | ORAL_TABLET | Freq: Three times a day (TID) | ORAL | 1 refills | Status: DC | PRN

## 2017-03-13 MED ORDER — SODIUM CHLORIDE 0.9 % IV SOLN
180.0000 mg | Freq: Once | INTRAVENOUS | Status: AC
  Administered 2017-03-13: 180 mg via INTRAVENOUS
  Filled 2017-03-13: qty 18

## 2017-03-13 MED ORDER — PACLITAXEL CHEMO INJECTION 300 MG/50ML
45.0000 mg/m2 | Freq: Once | INTRAVENOUS | Status: AC
  Administered 2017-03-13: 90 mg via INTRAVENOUS
  Filled 2017-03-13: qty 15

## 2017-03-13 MED ORDER — PALONOSETRON HCL INJECTION 0.25 MG/5ML
0.2500 mg | Freq: Once | INTRAVENOUS | Status: AC
  Administered 2017-03-13: 0.25 mg via INTRAVENOUS
  Filled 2017-03-13: qty 5

## 2017-03-13 MED ORDER — FAMOTIDINE IN NACL 20-0.9 MG/50ML-% IV SOLN
20.0000 mg | Freq: Once | INTRAVENOUS | Status: AC
  Administered 2017-03-13: 20 mg via INTRAVENOUS
  Filled 2017-03-13: qty 50

## 2017-03-13 MED ORDER — SODIUM CHLORIDE 0.9 % IV SOLN
Freq: Once | INTRAVENOUS | Status: AC
  Administered 2017-03-13: 11:00:00 via INTRAVENOUS
  Filled 2017-03-13: qty 1000

## 2017-03-13 NOTE — Progress Notes (Signed)
Hematology/Oncology Follow Up Note [facility] Telephone:([phone] [phone]   Patient Care Team: [name], MD as PCP - General (Family Medicine) [name], [name], MD (General Surgery) [name], MD as Consulting Physician (Oncology) [name], RN as Registered Nurse  Reason for Visit: follow up for treatment of lung cancer  HISTORY OF PRESENTING ILLNESS:  Marc Gray 71 y.o.  male who presents for treatment of lung cancer Patient felt a neck mass for about 7-8 months and lately also atypical chest pain and he went to see primary care physician [place]. CT neck was done which showed multiple cervical/supraclavical lymphadenopathy as well as left upper lobe mass. 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   Colon CA was listed in his history, however reviewing his surgical pathology from colonoscopy on 04/15/2014, during which he had polyps removed and all are negative for cancer.patient is scheduled for colonoscopy screening this year.  INTERVAL HISTORY Patient with above history is accompanied by his wife and presents for evaluation prior to concurrent chemoradiation therapy for his stage Gray B lung cancer.  He tolerated cycle 1 carbo and Taxol concurrent with radiation well without significant side effects.  He has chronic cough with some phlegm, no hemoptysis. He denies experiencing any nausea vomiting. Appetite is fair. He reports that he has some heartburn and as instructed by Dr. [name] to take pantoprazole Denies SOB, chest pain, abdominal pain. Patient has 53 pack year smoking history and currently daily smoker. He continues to smoke but has decreased his daily cigarette consumption to about 17 cigarettes daily and plan to continue decrease numbers until he quits.    Review of Systems - Oncology Constitutional: Negative for fever, night sweats,unintentional weight loss, change in appetite. HENT:  Negative for ear pain, hearing loss, nasal bleeding Eyes: Negative for eye pain, double vision   Respiratory: Negative for wheezing, shortness of breath, (+) cough Cardiovascular: Negative for chest pain, palpitation.   Gastrointestinal: Negative abdominal pain, diarrhea, nausea vomiting. positive heartburn Endocrine: Negative  Genitourinary: Negative for dysuria, hematuria, frequency Skin: Negative for rash, iching, bruising Neurological: Negative for headache, dizziness, seizure Hematological: Negative for easy bruising/bleeding, lymph node enlargement Psychiatric/Behavioral: Negative for depression, anxiety, suicidality  MEDICAL HISTORY:  Past Medical History:  Diagnosis Date  . Anxiety   . Arthritis    SHOULDER  . Cataract   . Colon cancer ([place]) 2015   Pt states during his colonoscopy he had cancerous polyps removed.   . Depression    SINCE DIAGNOSIS  . Dyspnea    DOE  . Enlarged prostate   . Pain    DUE TO LUNG ISSUE    SURGICAL HISTORY: Past Surgical History:  Procedure Laterality Date  . CATARACT EXTRACTION W/ INTRAOCULAR LENS  IMPLANT, BILATERAL    . COLONOSCOPY    . EYE SURGERY    . LYMPH GLAND EXCISION N/A 02/09/2017   Procedure: CERVICAL LYMPH NODE BIOPSY;  Surgeon: Robert Bellow, MD;  Location: [facility];  Service: General;  Laterality: N/A;  . PORTACATH PLACEMENT Right 02/23/2017   Procedure: INSERTION PORT-A-CATH;  Surgeon: Robert Bellow, MD;  Location: [facility];  Service: General;  Laterality: Right;  . PROSTATE SURGERY    . TONSILLECTOMY      SOCIAL HISTORY: Social History   Social History  . Marital status: Married    Spouse name: N/A  . Number of children: N/A  . Years of education: N/A   Occupational History  . Not  on file.   Social History Main Topics  . Smoking status: Current Every Day Smoker    Packs/day: 1.00    Years: 53.00    Types: Cigarettes  . Smokeless tobacco: Never Used  . Alcohol use Yes     Comment: occassional    . Drug use: No  . Sexual activity: Yes   Other Topics Concern  . Not on file   Social History Narrative  . No narrative on file    FAMILY HISTORY: Family History  Problem Relation Age of Onset  . Breast cancer Maternal Grandmother   . Dementia Mother   . Diabetes Father   . Stroke Father     ALLERGIES:  has No Known Allergies.  MEDICATIONS:  Current Outpatient Prescriptions  Medication Sig Dispense Refill  . aspirin 325 MG tablet Take 650 mg by mouth daily.     . Chlorpheniramine-Phenylephrine (SINUS & ALLERGY) 4-10 MG tablet Take 1 tablet by mouth daily as needed.     . lidocaine-prilocaine (EMLA) cream Apply over port 1-2 hours prior to chemotherapy treatment.  Cover with plastic wrap. 30 g 1  . Multiple Vitamin (MULTI-VITAMINS) TABS Take 1 tablet by mouth daily.      No current facility-administered medications for this visit.       [name]  PHYSICAL EXAMINATION: ECOG PERFORMANCE STATUS: 0 - Asymptomatic Vitals:   03/13/17 1056  BP: 127/75  Pulse: 70  Temp: 98.7 F (37.1 C)   Filed Weights   03/13/17 1056  Weight: 173 lb 1 oz (78.5 kg)  GENERAL: No distress, well nourished.  SKIN:  No rashes or significant lesions  HEAD: Normocephalic, No masses, lesions, tenderness or abnormalities  EYES: Conjunctiva are pink, non icteric ENT: External ears normal ,lips , buccal mucosa, and tongue normal and mucous membranes are moist  LYMPH: left supraclavicular lymph adenopathy.  LUNGS: Clear to auscultation, no crackles or wheezes. Decreased breath sound at the bases bilaterally HEART: Regular rate & rhythm, no murmurs, no gallops, S1 normal and S2 normal  ABDOMEN: Abdomen soft, non-tender, normal bowel sounds, I did not appreciate any  masses or organomegaly  MUSCULOSKELETAL: No CVA tenderness and no tenderness on percussion of the back or rib cage.  EXTREMITIES: No edema, no skin discoloration or tenderness NEURO: Alert & oriented, no focal motor/sensory  deficits.  LABORATORY DATA:  I have reviewed the data as listed Lab Results  Component Value Date   WBC 9.1 03/06/2017   HGB 15.2 03/06/2017   HCT 44.0 03/06/2017   MCV 92.0 03/06/2017   PLT 191 03/06/2017    Recent Labs  02/02/17 1603 03/06/17 0928  NA 135 138  K 4.3 3.9  CL 103 104  CO2 23 26  GLUCOSE 102* 131*  BUN 22* 19  CREATININE 1.34* 1.15  CALCIUM 9.1 9.1  GFRNONAA 52* >60  GFRAA >60 >60  PROT 7.2 7.3  ALBUMIN 3.7 3.8  AST 17 20  ALT 19 19  ALKPHOS 55 57  BILITOT 0.8 0.8    RADIOGRAPHIC STUDIES: I have personally reviewed the radiological images as listed and agreed with the findings in the report. Mr Jeri Cos Wo Contrast  Result Date: 02/12/2017 CLINICAL DATA:  Lung mass.  Staging EXAM: MRI HEAD WITHOUT AND WITH CONTRAST TECHNIQUE: Multiplanar, multiecho pulse sequences of the brain and surrounding structures were obtained without and with intravenous contrast. CONTRAST:  15 mL MultiHance IV COMPARISON:  None. FINDINGS: Brain: Ventricle size normal. Cerebral volume normal for age. Scattered small hyperintensities  in the subcortical white matter bilaterally. Small hyperintensity deep white matter on the left. Negative for acute infarct or hemorrhage. Negative for mass or edema. Normal enhancement postcontrast infusion. No enhancing mass lesion. Leptomeningeal enhancement normal. Vascular: Normal arterial flow void Skull and upper cervical spine: Negative Sinuses/Orbits: Mucosal edema in the paranasal sinuses. Bilateral cataract removal. Mastoid sinus clear Other: None IMPRESSION: Negative for metastatic disease Chronic microvascular ischemic changes in the white matter Sinus mucosal disease. Electronically Signed   By: Franchot Gallo M.D.   On: 02/12/2017 12:05   Nm Pet Image Initial (pi) Skull Base To Thigh  Result Date: 02/12/2017 CLINICAL DATA:  Initial treatment strategy for lung mass. EXAM: NUCLEAR MEDICINE PET SKULL BASE TO THIGH TECHNIQUE: 12.6 mCi F-18 FDG  was injected intravenously. Full-ring PET imaging was performed from the skull base to thigh after the radiotracer. CT data was obtained and used for attenuation correction and anatomic localization. FASTING BLOOD GLUCOSE:  Value: 85 mg/dl COMPARISON:  CT neck 01/24/2017. FINDINGS: NECK: No hypermetabolic lymph nodes in the neck. CT images show no acute findings. CHEST: Left supraclavicular lymph nodes measure up to 12 mm (CT image 48) with an SUV max of 4.1. Low left internal jugular lymph node measures 13 mm (CT image 64) with an SUV max of 7.2. Lymph node posterior to the right clavicle measures 13 mm (CT image 63) with an SUV max of 5.5. There are hypermetabolic lymph nodes throughout the mediastinum and both hilar regions. Index low right paratracheal lymph node measures 2.3 cm (CT image 84) with an SUV max of 9.3. Index right hilar hypermetabolism has an SUV max of 5.5. Correlation with CT is difficult due to lack of post-contrast imaging. Spiculated left upper lobe mass measures 4.8 x 6.0 cm with an SUV max of 9.0 and with extension to the left hilum and superior segment left lower lobe. Atherosclerotic calcification of the arterial vasculature, including coronary arteries. Heart is at the upper limits of normal in size to mildly enlarged. Small pericardial effusion. A few scattered patchy areas of peribronchovascular ground-glass are nonspecific and may be infectious or inflammatory in etiology. No pleural fluid. ABDOMEN/PELVIS: No abnormal hypermetabolism in the liver. Vague mild hypermetabolism in the right adrenal gland does not appear to have a CT correlate. No abnormal hypermetabolism in the left adrenal gland, spleen or pancreas. No hypermetabolic lymph nodes. CT images show the liver, gallbladder, adrenal glands and right kidney to be grossly unremarkable. Left kidney is atrophic. Spleen, pancreas, stomach and bowel are grossly unremarkable. Gastrohepatic ligament lymph nodes are not enlarged by CT  size criteria. Atherosclerotic calcification of the arterial vasculature without abdominal aortic aneurysm. TURP defect. SKELETON: No abnormal osseous hypermetabolism. Degenerative changes in the spine. IMPRESSION: 1. Hypermetabolic left upper lobe mass with involvement of the left hilum and superior segment left lower lobe as well as hypermetabolic mediastinal, right hilar and supraclavicular adenopathy, most consistent with primary bronchogenic carcinoma (at least T2bN3M0 or stage IIIB disease). 2. Vague hypermetabolism in the right adrenal gland without definite CT correlate. 3. Small pericardial effusion. 4. A few scattered areas of peribronchovascular ground-glass are nonspecific and may be infectious or inflammatory in etiology. Metastatic disease cannot be definitively excluded. 5. Aortic atherosclerosis (ICD10-170.0). Coronary artery calcification. Electronically Signed   By: Lorin Picket M.D.   On: 02/12/2017 10:34   Dg Chest Port 1 View  Result Date: 02/23/2017 CLINICAL DATA:  Post Port-A-Cath. History of colon cancer and lung cancer. Smoker. EXAM: PORTABLE CHEST 1 VIEW COMPARISON:  PET-CT 02/12/2017 FINDINGS: Placement of right IJ Port-A-Cath with tip overlying the SVC. Lungs are adequately inflated demonstrate patient's large known left upper lobe/hilar lung cancer. Minimal prominence of the perihilar markings. Borderline cardiomegaly. Minimal degenerate change of the spine. IMPRESSION: Known left upper lobe/hilar lung cancer. Right IJ Port-A-Cath with tip over the SVC.  No pneumothorax. Electronically Signed   By: Marin Olp M.D.   On: 02/23/2017 12:44   Dg C-arm 1-60 Min-no Report  Result Date: 02/23/2017 Fluoroscopy was utilized by the requesting physician.  No radiographic interpretation.    ASSESSMENT & PLAN:  Cancer Staging Adenocarcinoma of left lung, stage 3 (HCC) Staging form: Lung, AJCC 8th Edition - Clinical stage from 02/14/2017: Stage IIIB (cT2b, cN3, cM0) - Signed by [name], MD on 02/14/2017  1. Adenocarcinoma of left lung, stage 3 (HCC)   2. Cervical lymphadenopathy   3. Tobacco abuse counseling   4. Encounter for antineoplastic chemotherapy      Okay to proceed cycle 2 concurrent Carbo Taxol with radiation.Marland KitchenHe is on daily radiation Monday through Friday,  last day of radiation scheduled as 11/12.  Antiemetics, Emla cream sent to his pharmacy.  Advice patient to continue take pantoprazole for acid reflux. # smoke cessation is discussed with patient in detail. He has  All questions were answered. The patient knows to call the clinic with any problems questions or concerns. Return of visit:  He will be evaluated by my colleague prior to cycle 3, and 4 treatment. And I will see him on 11/6 for his cycle 5 treatment. Patient is aware  [name], MD, PhD Hematology Oncology [facility] Pager- [phone] 03/13/2017

## 2017-03-13 NOTE — Progress Notes (Signed)
Patient here today for follow up and chemotherapy

## 2017-03-14 ENCOUNTER — Ambulatory Visit
Admission: RE | Admit: 2017-03-14 | Discharge: 2017-03-14 | Disposition: A | Discharge status: Home / Self Care | Payer: PPO | Source: Ambulatory Visit | Attending: Radiation Oncology | Admitting: Radiation Oncology

## 2017-03-14 DIAGNOSIS — C778 Secondary and unspecified malignant neoplasm of lymph nodes of multiple regions: Secondary | ICD-10-CM | POA: Diagnosis not present

## 2017-03-14 DIAGNOSIS — Z510 Encounter for antineoplastic radiation therapy: Secondary | ICD-10-CM | POA: Diagnosis not present

## 2017-03-14 DIAGNOSIS — C3412 Malignant neoplasm of upper lobe, left bronchus or lung: Secondary | ICD-10-CM | POA: Diagnosis not present

## 2017-03-14 DIAGNOSIS — F17210 Nicotine dependence, cigarettes, uncomplicated: Secondary | ICD-10-CM | POA: Diagnosis not present

## 2017-03-15 ENCOUNTER — Ambulatory Visit
Admission: RE | Admit: 2017-03-15 | Discharge: 2017-03-15 | Disposition: A | Discharge status: Home / Self Care | Payer: PPO | Source: Ambulatory Visit | Attending: Radiation Oncology | Admitting: Radiation Oncology

## 2017-03-15 DIAGNOSIS — C3412 Malignant neoplasm of upper lobe, left bronchus or lung: Secondary | ICD-10-CM | POA: Diagnosis not present

## 2017-03-15 DIAGNOSIS — F17210 Nicotine dependence, cigarettes, uncomplicated: Secondary | ICD-10-CM | POA: Diagnosis not present

## 2017-03-15 DIAGNOSIS — C778 Secondary and unspecified malignant neoplasm of lymph nodes of multiple regions: Secondary | ICD-10-CM | POA: Diagnosis not present

## 2017-03-15 DIAGNOSIS — Z510 Encounter for antineoplastic radiation therapy: Secondary | ICD-10-CM | POA: Diagnosis not present

## 2017-03-16 ENCOUNTER — Ambulatory Visit
Admission: RE | Admit: 2017-03-16 | Discharge: 2017-03-16 | Disposition: A | Discharge status: Home / Self Care | Payer: PPO | Source: Ambulatory Visit | Attending: Radiation Oncology | Admitting: Radiation Oncology

## 2017-03-16 DIAGNOSIS — C778 Secondary and unspecified malignant neoplasm of lymph nodes of multiple regions: Secondary | ICD-10-CM | POA: Diagnosis not present

## 2017-03-16 DIAGNOSIS — C3412 Malignant neoplasm of upper lobe, left bronchus or lung: Secondary | ICD-10-CM | POA: Diagnosis not present

## 2017-03-16 DIAGNOSIS — Z510 Encounter for antineoplastic radiation therapy: Secondary | ICD-10-CM | POA: Diagnosis not present

## 2017-03-16 DIAGNOSIS — F17210 Nicotine dependence, cigarettes, uncomplicated: Secondary | ICD-10-CM | POA: Diagnosis not present

## 2017-03-19 ENCOUNTER — Ambulatory Visit: Admission: RE | Admit: 2017-03-19 | Payer: PPO | Source: Ambulatory Visit

## 2017-03-19 ENCOUNTER — Ambulatory Visit: Payer: PPO

## 2017-03-19 NOTE — Progress Notes (Signed)
Hematology/Oncology Consult note Marc Gray  Telephone:([phone] Fax:([phone]  Patient Care Team: Marc Pink, MD as PCP - General (Family Medicine) [name], [name], MD (General Surgery) [name], MD as Consulting Physician (Oncology) [name], RN as Registered Nurse   Name of the patient: Marc Gray  [id number]  1945/08/18   Date of visit: 03/19/17  Diagnosis- stage IIIB adenocarcinoma of the left lung  Chief complaint/ Reason for visit- on treatment assessment prior to cycle #3 of carbotaxol along with radiation  Heme/Onc history: Marc Gray 71 y.o.  male who presents for treatment of lung cancer Patient felt a neck mass for about 7-8 months and lately also atypical chest pain and he went to see primary care physician [place]. CT neck was done which showed multiple cervical/supraclavical lymphadenopathy as well as left upper lobe mass. 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 Concurrent chemoradiation was started on 03/06/2017  Interval history- he is tolerating chemotherapy well. Denies any nausea or vomiting. Reports mild fatigue but he is able to carry on his daily ADLs. Denies any tingling or burning sensation in his extremities. He did have constipation about a week ago and started taking laxatives which have helped. Appetite is good and he denies any pain during swallowing  ECOG PS- 0 Pain scale- 0   Review of systems- Review of Systems  Constitutional: Negative for chills, fever, malaise/fatigue and weight loss.  HENT: Negative for congestion, ear discharge and nosebleeds.   Eyes: Negative for blurred vision.  Respiratory: Negative for cough, hemoptysis, sputum production, shortness of breath and wheezing.   Cardiovascular: Negative for chest pain, palpitations, orthopnea and claudication.  Gastrointestinal: Negative for abdominal pain, blood in stool, constipation, diarrhea,  heartburn, melena, nausea and vomiting.  Genitourinary: Negative for dysuria, flank pain, frequency, hematuria and urgency.  Musculoskeletal: Negative for back pain, joint pain and myalgias.  Skin: Negative for rash.  Neurological: Negative for dizziness, tingling, focal weakness, seizures, weakness and headaches.  Endo/Heme/Allergies: Does not bruise/bleed easily.  Psychiatric/Behavioral: Negative for depression and suicidal ideas. The patient does not have insomnia.       No Known Allergies   Past Medical History:  Diagnosis Date  . Anxiety   . Arthritis    SHOULDER  . Cataract   . Colon cancer ([place]) 2015   Pt states during his colonoscopy he had cancerous polyps removed.   . Depression    SINCE DIAGNOSIS  . Dyspnea    DOE  . Enlarged prostate   . Pain    DUE TO LUNG ISSUE     Past Surgical History:  Procedure Laterality Date  . CATARACT EXTRACTION W/ INTRAOCULAR LENS  IMPLANT, BILATERAL    . COLONOSCOPY    . EYE SURGERY    . LYMPH GLAND EXCISION N/A 02/09/2017   Procedure: CERVICAL LYMPH NODE BIOPSY;  Surgeon: Marc Bellow, MD;  Location: [facility];  Service: General;  Laterality: N/A;  . PORTACATH PLACEMENT Right 02/23/2017   Procedure: INSERTION PORT-A-CATH;  Surgeon: Marc Bellow, MD;  Location: [facility];  Service: General;  Laterality: Right;  . PROSTATE SURGERY    . TONSILLECTOMY      Social History   Social History  . Marital status: Married    Spouse name: N/A  . Number of children: N/A  . Years of education: N/A   Occupational History  . Not on file.   Social History Main Topics  . Smoking status: Current Every  Day Smoker    Packs/day: 1.00    Years: 53.00    Types: Cigarettes  . Smokeless tobacco: Never Used  . Alcohol use Yes     Comment: occassional  . Drug use: No  . Sexual activity: Yes   Other Topics Concern  . Not on file   Social History Narrative  . No narrative on file    Family History  Problem Relation Age of  Onset  . Breast cancer Maternal Grandmother   . Dementia Mother   . Diabetes Father   . Stroke Father      Current Outpatient Prescriptions:  .  aspirin 325 MG tablet, Take 650 mg by mouth daily. , Disp: , Rfl:  .  Chlorpheniramine-Phenylephrine (SINUS & ALLERGY) 4-10 MG tablet, Take 1 tablet by mouth daily as needed. , Disp: , Rfl:  .  lidocaine-prilocaine (EMLA) cream, Apply over port 1-2 hours prior to chemotherapy treatment.  Cover with plastic wrap., Disp: 30 g, Rfl: 1 .  Multiple Vitamin (MULTI-VITAMINS) TABS, Take 1 tablet by mouth daily. , Disp: , Rfl:  .  ondansetron (ZOFRAN) 8 MG tablet, Take 1 tablet (8 mg total) by mouth every 8 (eight) hours as needed for nausea or vomiting., Disp: 60 tablet, Rfl: 1 .  prochlorperazine (COMPAZINE) 10 MG tablet, Take 1 tablet (10 mg total) by mouth every 6 (six) hours as needed for nausea or vomiting., Disp: 60 tablet, Rfl: 2  Physical exam:  Vitals:   03/20/17 1025 03/20/17 1026  BP:  (!) 144/86  Pulse: 62   Temp: (!) 96.4 F (35.8 C)   TempSrc: Tympanic   Weight: 175 lb 0.7 oz (79.4 kg) 175 lb 0.7 oz (79.4 kg)   Physical Exam  Constitutional: He is oriented to person, place, and time and well-developed, well-nourished, and in no distress.  HENT:  Head: Normocephalic and atraumatic.  Eyes: Pupils are equal, round, and reactive to light. EOM are normal.  Neck: Normal range of motion.  Cardiovascular: Normal rate, regular rhythm and normal heart sounds.   Pulmonary/Chest: Effort normal and breath sounds normal.  Abdominal: Soft. Bowel sounds are normal.  Lymphadenopathy:  Palpable left supraclavicular adenopathy  Neurological: He is alert and oriented to person, place, and time.  Skin: Skin is warm and dry.     CMP Latest Ref Rng & Units 03/20/2017  Glucose 65 - 99 mg/dL 103(H)  BUN 6 - 20 mg/dL 24(H)  Creatinine 0.61 - 1.24 mg/dL 1.07  Sodium 135 - 145 mmol/L 134(L)  Potassium 3.5 - 5.1 mmol/L 4.6  Chloride 101 - 111 mmol/L  101  CO2 22 - 32 mmol/L 20(L)  Calcium 8.9 - 10.3 mg/dL 9.0  Total Protein 6.5 - 8.1 g/dL 6.8  Total Bilirubin 0.3 - 1.2 mg/dL 0.7  Alkaline Phos 38 - 126 U/L 53  AST 15 - 41 U/L 18  ALT 17 - 63 U/L 19   CBC Latest Ref Rng & Units 03/20/2017  WBC 3.8 - 10.6 K/uL 5.1  Hemoglobin 13.0 - 18.0 g/dL 14.0  Hematocrit 40.0 - 52.0 % 40.9  Platelets 150 - 440 K/uL 189    No images are attached to the encounter.  Dg Chest Port 1 View  Result Date: 02/23/2017 CLINICAL DATA:  Post Port-A-Cath. History of colon cancer and lung cancer. Smoker. EXAM: PORTABLE CHEST 1 VIEW COMPARISON:  PET-CT 02/12/2017 FINDINGS: Placement of right IJ Port-A-Cath with tip overlying the SVC. Lungs are adequately inflated demonstrate patient's large known left upper lobe/hilar lung  cancer. Minimal prominence of the perihilar markings. Borderline cardiomegaly. Minimal degenerate change of the spine. IMPRESSION: Known left upper lobe/hilar lung cancer. Right IJ Port-A-Cath with tip over the SVC.  No pneumothorax. Electronically Signed   By: Marin Olp M.D.   On: 02/23/2017 12:44   Dg C-arm 1-60 Min-no Report  Result Date: 02/23/2017 Fluoroscopy was utilized by the requesting physician.  No radiographic interpretation.     Assessment and plan- Patient is a 71 y.o. male with stage IIIB T2b N3 M0 adenocarcinoma of the left lung currently undergoing concurrent chemoradiation here for on treatment assessment prior to cycle #3 of weekly carbo/ taxol  Counts okay to proceed with cycle #3 of weekly carbotaxol today. His overall tolerating chemotherapy well without any significant side effects. He will proceed for cycle #4 of chemotherapy next week and will also have his labs checked CBC, CMP at that time. He will return to clinic in 2 weeks to see Dr. [name] for cycle #5 of carbo/ taxol with cbc, cmp     Visit Diagnosis 1. Adenocarcinoma of left lung, stage 3 ([place])   2. Encounter for antineoplastic chemotherapy      Dr.  Randa Evens, MD, MPH [facility] Pager- [phone] 03/20/2017 10:43 AM

## 2017-03-20 ENCOUNTER — Inpatient Hospital Stay: Payer: PPO

## 2017-03-20 ENCOUNTER — Inpatient Hospital Stay (HOSPITAL_BASED_OUTPATIENT_CLINIC_OR_DEPARTMENT_OTHER): Payer: PPO | Admitting: Oncology

## 2017-03-20 ENCOUNTER — Ambulatory Visit
Admission: RE | Admit: 2017-03-20 | Discharge: 2017-03-20 | Disposition: A | Discharge status: Home / Self Care | Payer: PPO | Source: Ambulatory Visit | Attending: Radiation Oncology | Admitting: Radiation Oncology

## 2017-03-20 VITALS — BP 144/86 | HR 62 | Temp 96.4°F | Wt 175.0 lb

## 2017-03-20 DIAGNOSIS — K5900 Constipation, unspecified: Secondary | ICD-10-CM

## 2017-03-20 DIAGNOSIS — R590 Localized enlarged lymph nodes: Secondary | ICD-10-CM

## 2017-03-20 DIAGNOSIS — Z23 Encounter for immunization: Secondary | ICD-10-CM

## 2017-03-20 DIAGNOSIS — N400 Enlarged prostate without lower urinary tract symptoms: Secondary | ICD-10-CM

## 2017-03-20 DIAGNOSIS — C770 Secondary and unspecified malignant neoplasm of lymph nodes of head, face and neck: Secondary | ICD-10-CM

## 2017-03-20 DIAGNOSIS — F17210 Nicotine dependence, cigarettes, uncomplicated: Secondary | ICD-10-CM | POA: Diagnosis not present

## 2017-03-20 DIAGNOSIS — Z7982 Long term (current) use of aspirin: Secondary | ICD-10-CM

## 2017-03-20 DIAGNOSIS — M1990 Unspecified osteoarthritis, unspecified site: Secondary | ICD-10-CM

## 2017-03-20 DIAGNOSIS — I700 Atherosclerosis of aorta: Secondary | ICD-10-CM | POA: Diagnosis not present

## 2017-03-20 DIAGNOSIS — C778 Secondary and unspecified malignant neoplasm of lymph nodes of multiple regions: Secondary | ICD-10-CM | POA: Diagnosis not present

## 2017-03-20 DIAGNOSIS — C3492 Malignant neoplasm of unspecified part of left bronchus or lung: Secondary | ICD-10-CM

## 2017-03-20 DIAGNOSIS — R5383 Other fatigue: Secondary | ICD-10-CM

## 2017-03-20 DIAGNOSIS — Z5111 Encounter for antineoplastic chemotherapy: Secondary | ICD-10-CM

## 2017-03-20 DIAGNOSIS — Z86010 Personal history of colonic polyps: Secondary | ICD-10-CM

## 2017-03-20 DIAGNOSIS — F418 Other specified anxiety disorders: Secondary | ICD-10-CM

## 2017-03-20 DIAGNOSIS — Z79899 Other long term (current) drug therapy: Secondary | ICD-10-CM | POA: Diagnosis not present

## 2017-03-20 DIAGNOSIS — Z85038 Personal history of other malignant neoplasm of large intestine: Secondary | ICD-10-CM

## 2017-03-20 DIAGNOSIS — C3412 Malignant neoplasm of upper lobe, left bronchus or lung: Secondary | ICD-10-CM | POA: Diagnosis not present

## 2017-03-20 DIAGNOSIS — Z803 Family history of malignant neoplasm of breast: Secondary | ICD-10-CM

## 2017-03-20 DIAGNOSIS — Z510 Encounter for antineoplastic radiation therapy: Secondary | ICD-10-CM | POA: Diagnosis not present

## 2017-03-20 LAB — COMPREHENSIVE METABOLIC PANEL
ALBUMIN: 3.6 g/dL (ref 3.5–5.0)
ALK PHOS: 53 U/L (ref 38–126)
ALT: 19 U/L (ref 17–63)
AST: 18 U/L (ref 15–41)
Anion gap: 13 (ref 5–15)
BILIRUBIN TOTAL: 0.7 mg/dL (ref 0.3–1.2)
BUN: 24 mg/dL — ABNORMAL HIGH (ref 6–20)
CALCIUM: 9 mg/dL (ref 8.9–10.3)
CO2: 20 mmol/L — ABNORMAL LOW (ref 22–32)
Chloride: 101 mmol/L (ref 101–111)
Creatinine, Ser: 1.07 mg/dL (ref 0.61–1.24)
GFR calc non Af Amer: >60 mL/min (ref >60)
GLUCOSE: 103 mg/dL — AB (ref 65–99)
POTASSIUM: 4.6 mmol/L (ref 3.5–5.1)
SODIUM: 134 mmol/L — AB (ref 135–145)
TOTAL PROTEIN: 6.8 g/dL (ref 6.5–8.1)

## 2017-03-20 LAB — CBC WITH DIFFERENTIAL/PLATELET
BASOS PCT: 1 %
Basophils Absolute: 0 10*3/uL (ref 0–0.1)
EOS ABS: 0.2 10*3/uL (ref 0–0.7)
Eosinophils Relative: 4 %
HCT: 40.9 % (ref 40.0–52.0)
Hemoglobin: 14 g/dL (ref 13.0–18.0)
LYMPHS ABS: 0.9 10*3/uL — AB (ref 1.0–3.6)
Lymphocytes Relative: 17 %
MCH: 31.4 pg (ref 26.0–34.0)
MCHC: 34.1 g/dL (ref 32.0–36.0)
MCV: 92.1 fL (ref 80.0–100.0)
MONO ABS: 0.5 10*3/uL (ref 0.2–1.0)
MONOS PCT: 10 %
Neutro Abs: 3.4 10*3/uL (ref 1.4–6.5)
Neutrophils Relative %: 68 %
Platelets: 189 10*3/uL (ref 150–440)
RBC: 4.44 MIL/uL (ref 4.40–5.90)
RDW: 13.8 % (ref 11.5–14.5)
WBC: 5.1 10*3/uL (ref 3.8–10.6)

## 2017-03-20 MED ORDER — FAMOTIDINE IN NACL 20-0.9 MG/50ML-% IV SOLN
20.0000 mg | Freq: Once | INTRAVENOUS | Status: AC
  Administered 2017-03-20: 20 mg via INTRAVENOUS
  Filled 2017-03-20: qty 50

## 2017-03-20 MED ORDER — SODIUM CHLORIDE 0.9 % IV SOLN
180.0000 mg | Freq: Once | INTRAVENOUS | Status: AC
  Administered 2017-03-20: 180 mg via INTRAVENOUS
  Filled 2017-03-20: qty 18

## 2017-03-20 MED ORDER — INFLUENZA VAC SPLIT QUAD 0.5 ML IM SUSY
0.5000 mL | PREFILLED_SYRINGE | Freq: Once | INTRAMUSCULAR | Status: AC
  Administered 2017-03-20: 0.5 mL via INTRAMUSCULAR
  Filled 2017-03-20: qty 0.5

## 2017-03-20 MED ORDER — HEPARIN SOD (PORK) LOCK FLUSH 100 UNIT/ML IV SOLN
500.0000 [IU] | Freq: Once | INTRAVENOUS | Status: AC | PRN
  Administered 2017-03-20: 500 [IU]
  Filled 2017-03-20: qty 5

## 2017-03-20 MED ORDER — PALONOSETRON HCL INJECTION 0.25 MG/5ML
0.2500 mg | Freq: Once | INTRAVENOUS | Status: AC
  Administered 2017-03-20: 0.25 mg via INTRAVENOUS
  Filled 2017-03-20: qty 5

## 2017-03-20 MED ORDER — DIPHENHYDRAMINE HCL 50 MG/ML IJ SOLN
50.0000 mg | Freq: Once | INTRAMUSCULAR | Status: AC
  Administered 2017-03-20: 50 mg via INTRAVENOUS
  Filled 2017-03-20: qty 1

## 2017-03-20 MED ORDER — PACLITAXEL CHEMO INJECTION 300 MG/50ML
45.0000 mg/m2 | Freq: Once | INTRAVENOUS | Status: AC
  Administered 2017-03-20: 90 mg via INTRAVENOUS
  Filled 2017-03-20: qty 15

## 2017-03-20 MED ORDER — SODIUM CHLORIDE 0.9 % IV SOLN
Freq: Once | INTRAVENOUS | Status: AC
  Administered 2017-03-20: 11:00:00 via INTRAVENOUS
  Filled 2017-03-20: qty 1000

## 2017-03-20 MED ORDER — SODIUM CHLORIDE 0.9 % IV SOLN
20.0000 mg | Freq: Once | INTRAVENOUS | Status: AC
  Administered 2017-03-20: 20 mg via INTRAVENOUS
  Filled 2017-03-20: qty 2

## 2017-03-20 NOTE — Progress Notes (Signed)
Patient here for follow up with labs and treatment with carboplatin and taxol. He states that physically he is feeling well, but there is some "outside stuff" going on that has caused him some anxiety.

## 2017-03-21 ENCOUNTER — Ambulatory Visit
Admission: RE | Admit: 2017-03-21 | Discharge: 2017-03-21 | Disposition: A | Discharge status: Home / Self Care | Payer: PPO | Source: Ambulatory Visit | Attending: Radiation Oncology | Admitting: Radiation Oncology

## 2017-03-21 DIAGNOSIS — C3412 Malignant neoplasm of upper lobe, left bronchus or lung: Secondary | ICD-10-CM | POA: Diagnosis not present

## 2017-03-21 DIAGNOSIS — Z510 Encounter for antineoplastic radiation therapy: Secondary | ICD-10-CM | POA: Diagnosis not present

## 2017-03-21 DIAGNOSIS — C778 Secondary and unspecified malignant neoplasm of lymph nodes of multiple regions: Secondary | ICD-10-CM | POA: Diagnosis not present

## 2017-03-21 DIAGNOSIS — F17210 Nicotine dependence, cigarettes, uncomplicated: Secondary | ICD-10-CM | POA: Diagnosis not present

## 2017-03-22 ENCOUNTER — Ambulatory Visit
Admission: RE | Admit: 2017-03-22 | Discharge: 2017-03-22 | Disposition: A | Discharge status: Home / Self Care | Payer: PPO | Source: Ambulatory Visit | Attending: Radiation Oncology | Admitting: Radiation Oncology

## 2017-03-22 DIAGNOSIS — F17210 Nicotine dependence, cigarettes, uncomplicated: Secondary | ICD-10-CM | POA: Diagnosis not present

## 2017-03-22 DIAGNOSIS — C3412 Malignant neoplasm of upper lobe, left bronchus or lung: Secondary | ICD-10-CM | POA: Diagnosis not present

## 2017-03-22 DIAGNOSIS — C778 Secondary and unspecified malignant neoplasm of lymph nodes of multiple regions: Secondary | ICD-10-CM | POA: Diagnosis not present

## 2017-03-22 DIAGNOSIS — Z510 Encounter for antineoplastic radiation therapy: Secondary | ICD-10-CM | POA: Diagnosis not present

## 2017-03-23 ENCOUNTER — Ambulatory Visit
Admission: RE | Admit: 2017-03-23 | Discharge: 2017-03-23 | Disposition: A | Discharge status: Home / Self Care | Payer: PPO | Source: Ambulatory Visit | Attending: Radiation Oncology | Admitting: Radiation Oncology

## 2017-03-23 DIAGNOSIS — C3412 Malignant neoplasm of upper lobe, left bronchus or lung: Secondary | ICD-10-CM | POA: Diagnosis not present

## 2017-03-23 DIAGNOSIS — C778 Secondary and unspecified malignant neoplasm of lymph nodes of multiple regions: Secondary | ICD-10-CM | POA: Diagnosis not present

## 2017-03-23 DIAGNOSIS — F17210 Nicotine dependence, cigarettes, uncomplicated: Secondary | ICD-10-CM | POA: Diagnosis not present

## 2017-03-23 DIAGNOSIS — Z510 Encounter for antineoplastic radiation therapy: Secondary | ICD-10-CM | POA: Diagnosis not present

## 2017-03-26 ENCOUNTER — Ambulatory Visit
Admission: RE | Admit: 2017-03-26 | Discharge: 2017-03-26 | Disposition: A | Discharge status: Home / Self Care | Payer: PPO | Source: Ambulatory Visit | Attending: Radiation Oncology | Admitting: Radiation Oncology

## 2017-03-26 DIAGNOSIS — F17210 Nicotine dependence, cigarettes, uncomplicated: Secondary | ICD-10-CM | POA: Diagnosis not present

## 2017-03-26 DIAGNOSIS — Z510 Encounter for antineoplastic radiation therapy: Secondary | ICD-10-CM | POA: Diagnosis not present

## 2017-03-26 DIAGNOSIS — C778 Secondary and unspecified malignant neoplasm of lymph nodes of multiple regions: Secondary | ICD-10-CM | POA: Diagnosis not present

## 2017-03-26 DIAGNOSIS — C3412 Malignant neoplasm of upper lobe, left bronchus or lung: Secondary | ICD-10-CM | POA: Diagnosis not present

## 2017-03-27 ENCOUNTER — Other Ambulatory Visit: Payer: Self-pay | Admitting: *Deleted

## 2017-03-27 ENCOUNTER — Other Ambulatory Visit: Payer: Self-pay | Admitting: Oncology

## 2017-03-27 ENCOUNTER — Ambulatory Visit
Admission: RE | Admit: 2017-03-27 | Discharge: 2017-03-27 | Disposition: A | Discharge status: Home / Self Care | Payer: PPO | Source: Ambulatory Visit | Attending: Radiation Oncology | Admitting: Radiation Oncology

## 2017-03-27 ENCOUNTER — Inpatient Hospital Stay: Payer: PPO

## 2017-03-27 ENCOUNTER — Ambulatory Visit: Payer: PPO | Admitting: Oncology

## 2017-03-27 VITALS — BP 109/73 | HR 69 | Temp 96.3°F | Wt 174.0 lb

## 2017-03-27 DIAGNOSIS — C3492 Malignant neoplasm of unspecified part of left bronchus or lung: Secondary | ICD-10-CM

## 2017-03-27 DIAGNOSIS — C778 Secondary and unspecified malignant neoplasm of lymph nodes of multiple regions: Secondary | ICD-10-CM | POA: Diagnosis not present

## 2017-03-27 DIAGNOSIS — C3412 Malignant neoplasm of upper lobe, left bronchus or lung: Secondary | ICD-10-CM | POA: Diagnosis not present

## 2017-03-27 DIAGNOSIS — Z510 Encounter for antineoplastic radiation therapy: Secondary | ICD-10-CM | POA: Diagnosis not present

## 2017-03-27 DIAGNOSIS — F17210 Nicotine dependence, cigarettes, uncomplicated: Secondary | ICD-10-CM | POA: Diagnosis not present

## 2017-03-27 DIAGNOSIS — Z5111 Encounter for antineoplastic chemotherapy: Secondary | ICD-10-CM | POA: Diagnosis not present

## 2017-03-27 DIAGNOSIS — N179 Acute kidney failure, unspecified: Secondary | ICD-10-CM

## 2017-03-27 LAB — CBC WITH DIFFERENTIAL/PLATELET
BASOS ABS: 0 10*3/uL (ref 0–0.1)
BASOS PCT: 1 %
EOS ABS: 0.1 10*3/uL (ref 0–0.7)
EOS PCT: 2 %
HCT: 40.7 % (ref 40.0–52.0)
Hemoglobin: 13.8 g/dL (ref 13.0–18.0)
LYMPHS PCT: 20 %
Lymphs Abs: 0.8 10*3/uL — ABNORMAL LOW (ref 1.0–3.6)
MCH: 31.4 pg (ref 26.0–34.0)
MCHC: 34 g/dL (ref 32.0–36.0)
MCV: 92.4 fL (ref 80.0–100.0)
MONO ABS: 0.5 10*3/uL (ref 0.2–1.0)
Monocytes Relative: 13 %
Neutro Abs: 2.8 10*3/uL (ref 1.4–6.5)
Neutrophils Relative %: 64 %
Platelets: 158 10*3/uL (ref 150–440)
RBC: 4.4 MIL/uL (ref 4.40–5.90)
RDW: 13.9 % (ref 11.5–14.5)
WBC: 4.3 10*3/uL (ref 3.8–10.6)

## 2017-03-27 LAB — COMPREHENSIVE METABOLIC PANEL
ALBUMIN: 3.6 g/dL (ref 3.5–5.0)
ALK PHOS: 54 U/L (ref 38–126)
ALT: 28 U/L (ref 17–63)
AST: 24 U/L (ref 15–41)
Anion gap: 9 (ref 5–15)
BILIRUBIN TOTAL: 0.8 mg/dL (ref 0.3–1.2)
BUN: 25 mg/dL — AB (ref 6–20)
CALCIUM: 8.9 mg/dL (ref 8.9–10.3)
CO2: 24 mmol/L (ref 22–32)
CREATININE: 1.33 mg/dL — AB (ref 0.61–1.24)
Chloride: 103 mmol/L (ref 101–111)
GFR calc Af Amer: >60 mL/min (ref >60)
GFR calc non Af Amer: 53 mL/min — ABNORMAL LOW (ref >60)
GLUCOSE: 86 mg/dL (ref 65–99)
Potassium: 4.2 mmol/L (ref 3.5–5.1)
Sodium: 136 mmol/L (ref 135–145)
TOTAL PROTEIN: 6.6 g/dL (ref 6.5–8.1)

## 2017-03-27 MED ORDER — CARBOPLATIN CHEMO INJECTION 450 MG/45ML
164.4000 mg | Freq: Once | INTRAVENOUS | Status: AC
  Administered 2017-03-27: 160 mg via INTRAVENOUS
  Filled 2017-03-27: qty 16

## 2017-03-27 MED ORDER — PALONOSETRON HCL INJECTION 0.25 MG/5ML
0.2500 mg | Freq: Once | INTRAVENOUS | Status: AC
  Administered 2017-03-27: 0.25 mg via INTRAVENOUS
  Filled 2017-03-27: qty 5

## 2017-03-27 MED ORDER — SODIUM CHLORIDE 0.9 % IV SOLN
INTRAVENOUS | Status: DC
  Filled 2017-03-27: qty 1000

## 2017-03-27 MED ORDER — HEPARIN SOD (PORK) LOCK FLUSH 100 UNIT/ML IV SOLN
500.0000 [IU] | Freq: Once | INTRAVENOUS | Status: AC
  Administered 2017-03-27: 500 [IU] via INTRAVENOUS
  Filled 2017-03-27: qty 5

## 2017-03-27 MED ORDER — SODIUM CHLORIDE 0.9 % IV SOLN
Freq: Once | INTRAVENOUS | Status: AC
  Administered 2017-03-27: 12:00:00 via INTRAVENOUS
  Filled 2017-03-27: qty 1000

## 2017-03-27 MED ORDER — SODIUM CHLORIDE 0.9 % IV SOLN
20.0000 mg | Freq: Once | INTRAVENOUS | Status: AC
  Administered 2017-03-27: 20 mg via INTRAVENOUS
  Filled 2017-03-27: qty 2

## 2017-03-27 MED ORDER — FAMOTIDINE IN NACL 20-0.9 MG/50ML-% IV SOLN
20.0000 mg | Freq: Once | INTRAVENOUS | Status: AC
  Administered 2017-03-27: 20 mg via INTRAVENOUS
  Filled 2017-03-27: qty 50

## 2017-03-27 MED ORDER — SUCRALFATE 1 G PO TABS: 1.0000 g | ORAL_TABLET | Freq: Three times a day (TID) | ORAL | 3 refills | Status: DC

## 2017-03-27 MED ORDER — SODIUM CHLORIDE 0.9 % IV SOLN
Freq: Once | INTRAVENOUS | Status: AC
  Administered 2017-03-27: 11:00:00 via INTRAVENOUS
  Filled 2017-03-27: qty 1000

## 2017-03-27 MED ORDER — HEPARIN SOD (PORK) LOCK FLUSH 100 UNIT/ML IV SOLN: 500.0000 [IU] | Freq: Once | INTRAVENOUS | Status: DC | PRN

## 2017-03-27 MED ORDER — DEXTROSE 5 % IV SOLN
45.0000 mg/m2 | Freq: Once | INTRAVENOUS | Status: AC
  Administered 2017-03-27: 90 mg via INTRAVENOUS
  Filled 2017-03-27: qty 15

## 2017-03-27 MED ORDER — DIPHENHYDRAMINE HCL 50 MG/ML IJ SOLN
50.0000 mg | Freq: Once | INTRAMUSCULAR | Status: AC
  Administered 2017-03-27: 50 mg via INTRAVENOUS
  Filled 2017-03-27: qty 1

## 2017-03-27 MED ORDER — SODIUM CHLORIDE 0.9% FLUSH
10.0000 mL | INTRAVENOUS | Status: DC | PRN
  Administered 2017-03-27: 10 mL via INTRAVENOUS
  Filled 2017-03-27: qty 10

## 2017-03-27 NOTE — Progress Notes (Signed)
Carboplatin dose changed from 180mg  to 160mg  due to increase in SCr.

## 2017-03-27 NOTE — Progress Notes (Signed)
Marc Gray received 1 liter NS bolus for elevated BUN and creatinine.

## 2017-03-28 ENCOUNTER — Ambulatory Visit
Admission: RE | Admit: 2017-03-28 | Discharge: 2017-03-28 | Disposition: A | Discharge status: Home / Self Care | Payer: PPO | Source: Ambulatory Visit | Attending: Radiation Oncology | Admitting: Radiation Oncology

## 2017-03-28 DIAGNOSIS — C778 Secondary and unspecified malignant neoplasm of lymph nodes of multiple regions: Secondary | ICD-10-CM | POA: Diagnosis not present

## 2017-03-28 DIAGNOSIS — C3412 Malignant neoplasm of upper lobe, left bronchus or lung: Secondary | ICD-10-CM | POA: Diagnosis not present

## 2017-03-28 DIAGNOSIS — Z510 Encounter for antineoplastic radiation therapy: Secondary | ICD-10-CM | POA: Diagnosis not present

## 2017-03-28 DIAGNOSIS — F17210 Nicotine dependence, cigarettes, uncomplicated: Secondary | ICD-10-CM | POA: Diagnosis not present

## 2017-03-29 ENCOUNTER — Ambulatory Visit
Admission: RE | Admit: 2017-03-29 | Discharge: 2017-03-29 | Disposition: A | Discharge status: Home / Self Care | Payer: PPO | Source: Ambulatory Visit | Attending: Radiation Oncology | Admitting: Radiation Oncology

## 2017-03-29 DIAGNOSIS — F17210 Nicotine dependence, cigarettes, uncomplicated: Secondary | ICD-10-CM | POA: Diagnosis not present

## 2017-03-29 DIAGNOSIS — C3412 Malignant neoplasm of upper lobe, left bronchus or lung: Secondary | ICD-10-CM | POA: Diagnosis not present

## 2017-03-29 DIAGNOSIS — C778 Secondary and unspecified malignant neoplasm of lymph nodes of multiple regions: Secondary | ICD-10-CM | POA: Diagnosis not present

## 2017-03-29 DIAGNOSIS — Z510 Encounter for antineoplastic radiation therapy: Secondary | ICD-10-CM | POA: Diagnosis not present

## 2017-03-30 ENCOUNTER — Ambulatory Visit
Admission: RE | Admit: 2017-03-30 | Discharge: 2017-03-30 | Disposition: A | Discharge status: Home / Self Care | Payer: PPO | Source: Ambulatory Visit | Attending: Radiation Oncology | Admitting: Radiation Oncology

## 2017-03-30 DIAGNOSIS — C3412 Malignant neoplasm of upper lobe, left bronchus or lung: Secondary | ICD-10-CM | POA: Diagnosis not present

## 2017-03-30 DIAGNOSIS — Z510 Encounter for antineoplastic radiation therapy: Secondary | ICD-10-CM | POA: Diagnosis not present

## 2017-03-30 DIAGNOSIS — F17210 Nicotine dependence, cigarettes, uncomplicated: Secondary | ICD-10-CM | POA: Diagnosis not present

## 2017-03-30 DIAGNOSIS — C778 Secondary and unspecified malignant neoplasm of lymph nodes of multiple regions: Secondary | ICD-10-CM | POA: Diagnosis not present

## 2017-04-02 ENCOUNTER — Ambulatory Visit
Admission: RE | Admit: 2017-04-02 | Discharge: 2017-04-02 | Disposition: A | Discharge status: Home / Self Care | Payer: PPO | Source: Ambulatory Visit | Attending: Radiation Oncology | Admitting: Radiation Oncology

## 2017-04-02 DIAGNOSIS — C778 Secondary and unspecified malignant neoplasm of lymph nodes of multiple regions: Secondary | ICD-10-CM | POA: Diagnosis not present

## 2017-04-02 DIAGNOSIS — C3412 Malignant neoplasm of upper lobe, left bronchus or lung: Secondary | ICD-10-CM | POA: Diagnosis not present

## 2017-04-02 DIAGNOSIS — Z510 Encounter for antineoplastic radiation therapy: Secondary | ICD-10-CM | POA: Diagnosis not present

## 2017-04-02 DIAGNOSIS — F17210 Nicotine dependence, cigarettes, uncomplicated: Secondary | ICD-10-CM | POA: Diagnosis not present

## 2017-04-03 ENCOUNTER — Ambulatory Visit
Admission: RE | Admit: 2017-04-03 | Discharge: 2017-04-03 | Disposition: A | Discharge status: Home / Self Care | Payer: PPO | Source: Ambulatory Visit | Attending: Radiation Oncology | Admitting: Radiation Oncology

## 2017-04-03 ENCOUNTER — Inpatient Hospital Stay: Payer: PPO | Attending: Oncology | Admitting: Oncology

## 2017-04-03 ENCOUNTER — Inpatient Hospital Stay: Payer: PPO

## 2017-04-03 ENCOUNTER — Encounter: Payer: Self-pay | Admitting: Oncology

## 2017-04-03 VITALS — BP 135/80 | HR 63 | Temp 97.6°F | Resp 18 | Wt 187.4 lb

## 2017-04-03 DIAGNOSIS — K219 Gastro-esophageal reflux disease without esophagitis: Secondary | ICD-10-CM | POA: Insufficient documentation

## 2017-04-03 DIAGNOSIS — C770 Secondary and unspecified malignant neoplasm of lymph nodes of head, face and neck: Secondary | ICD-10-CM | POA: Diagnosis not present

## 2017-04-03 DIAGNOSIS — C3492 Malignant neoplasm of unspecified part of left bronchus or lung: Secondary | ICD-10-CM

## 2017-04-03 DIAGNOSIS — Z5111 Encounter for antineoplastic chemotherapy: Secondary | ICD-10-CM | POA: Insufficient documentation

## 2017-04-03 DIAGNOSIS — C3412 Malignant neoplasm of upper lobe, left bronchus or lung: Secondary | ICD-10-CM | POA: Diagnosis not present

## 2017-04-03 DIAGNOSIS — D6959 Other secondary thrombocytopenia: Secondary | ICD-10-CM | POA: Diagnosis not present

## 2017-04-03 DIAGNOSIS — C778 Secondary and unspecified malignant neoplasm of lymph nodes of multiple regions: Secondary | ICD-10-CM | POA: Diagnosis not present

## 2017-04-03 DIAGNOSIS — M19019 Primary osteoarthritis, unspecified shoulder: Secondary | ICD-10-CM | POA: Insufficient documentation

## 2017-04-03 DIAGNOSIS — T451X5S Adverse effect of antineoplastic and immunosuppressive drugs, sequela: Secondary | ICD-10-CM | POA: Insufficient documentation

## 2017-04-03 DIAGNOSIS — Z006 Encounter for examination for normal comparison and control in clinical research program: Secondary | ICD-10-CM | POA: Diagnosis not present

## 2017-04-03 DIAGNOSIS — Z7982 Long term (current) use of aspirin: Secondary | ICD-10-CM | POA: Insufficient documentation

## 2017-04-03 DIAGNOSIS — F418 Other specified anxiety disorders: Secondary | ICD-10-CM | POA: Insufficient documentation

## 2017-04-03 DIAGNOSIS — R590 Localized enlarged lymph nodes: Secondary | ICD-10-CM | POA: Diagnosis not present

## 2017-04-03 DIAGNOSIS — Z79899 Other long term (current) drug therapy: Secondary | ICD-10-CM | POA: Insufficient documentation

## 2017-04-03 DIAGNOSIS — D696 Thrombocytopenia, unspecified: Secondary | ICD-10-CM

## 2017-04-03 DIAGNOSIS — N400 Enlarged prostate without lower urinary tract symptoms: Secondary | ICD-10-CM | POA: Insufficient documentation

## 2017-04-03 DIAGNOSIS — Z85038 Personal history of other malignant neoplasm of large intestine: Secondary | ICD-10-CM | POA: Insufficient documentation

## 2017-04-03 DIAGNOSIS — Z716 Tobacco abuse counseling: Secondary | ICD-10-CM

## 2017-04-03 DIAGNOSIS — Z510 Encounter for antineoplastic radiation therapy: Secondary | ICD-10-CM | POA: Diagnosis not present

## 2017-04-03 DIAGNOSIS — F17210 Nicotine dependence, cigarettes, uncomplicated: Secondary | ICD-10-CM | POA: Diagnosis not present

## 2017-04-03 LAB — COMPREHENSIVE METABOLIC PANEL
ALBUMIN: 3.5 g/dL (ref 3.5–5.0)
ALT: 22 U/L (ref 17–63)
AST: 22 U/L (ref 15–41)
Alkaline Phosphatase: 50 U/L (ref 38–126)
Anion gap: 6 (ref 5–15)
BILIRUBIN TOTAL: 0.7 mg/dL (ref 0.3–1.2)
BUN: 21 mg/dL — AB (ref 6–20)
CHLORIDE: 104 mmol/L (ref 101–111)
CO2: 24 mmol/L (ref 22–32)
CREATININE: 1.23 mg/dL (ref 0.61–1.24)
Calcium: 8.8 mg/dL — ABNORMAL LOW (ref 8.9–10.3)
GFR calc Af Amer: >60 mL/min (ref >60)
GFR, EST NON AFRICAN AMERICAN: 58 mL/min — AB (ref >60)
GLUCOSE: 100 mg/dL — AB (ref 65–99)
Potassium: 4 mmol/L (ref 3.5–5.1)
Sodium: 134 mmol/L — ABNORMAL LOW (ref 135–145)
TOTAL PROTEIN: 6.5 g/dL (ref 6.5–8.1)

## 2017-04-03 LAB — CBC WITH DIFFERENTIAL/PLATELET
BASOS ABS: 0 10*3/uL (ref 0–0.1)
BASOS PCT: 1 %
Eosinophils Absolute: 0 10*3/uL (ref 0–0.7)
Eosinophils Relative: 1 %
HEMATOCRIT: 38.7 % — AB (ref 40.0–52.0)
HEMOGLOBIN: 13 g/dL (ref 13.0–18.0)
LYMPHS PCT: 15 %
Lymphs Abs: 0.5 10*3/uL — ABNORMAL LOW (ref 1.0–3.6)
MCH: 31 pg (ref 26.0–34.0)
MCHC: 33.7 g/dL (ref 32.0–36.0)
MCV: 92 fL (ref 80.0–100.0)
Monocytes Absolute: 0.4 10*3/uL (ref 0.2–1.0)
Monocytes Relative: 12 %
NEUTROS ABS: 2.6 10*3/uL (ref 1.4–6.5)
NEUTROS PCT: 71 %
Platelets: 102 10*3/uL — ABNORMAL LOW (ref 150–440)
RBC: 4.21 MIL/uL — ABNORMAL LOW (ref 4.40–5.90)
RDW: 14.4 % (ref 11.5–14.5)
WBC: 3.6 10*3/uL — ABNORMAL LOW (ref 3.8–10.6)

## 2017-04-03 MED ORDER — DIPHENHYDRAMINE HCL 50 MG/ML IJ SOLN
50.0000 mg | Freq: Once | INTRAMUSCULAR | Status: AC
  Administered 2017-04-03: 50 mg via INTRAVENOUS
  Filled 2017-04-03: qty 1

## 2017-04-03 MED ORDER — SODIUM CHLORIDE 0.9 % IV SOLN
180.0000 mg | Freq: Once | INTRAVENOUS | Status: AC
  Administered 2017-04-03: 180 mg via INTRAVENOUS
  Filled 2017-04-03: qty 18

## 2017-04-03 MED ORDER — SODIUM CHLORIDE 0.9 % IV SOLN
20.0000 mg | Freq: Once | INTRAVENOUS | Status: AC
  Administered 2017-04-03: 20 mg via INTRAVENOUS
  Filled 2017-04-03: qty 2

## 2017-04-03 MED ORDER — FAMOTIDINE IN NACL 20-0.9 MG/50ML-% IV SOLN
20.0000 mg | Freq: Once | INTRAVENOUS | Status: AC
  Administered 2017-04-03: 20 mg via INTRAVENOUS
  Filled 2017-04-03: qty 50

## 2017-04-03 MED ORDER — HEPARIN SOD (PORK) LOCK FLUSH 100 UNIT/ML IV SOLN
500.0000 [IU] | Freq: Once | INTRAVENOUS | Status: AC
  Administered 2017-04-03: 500 [IU] via INTRAVENOUS
  Filled 2017-04-03: qty 5

## 2017-04-03 MED ORDER — SODIUM CHLORIDE 0.9 % IV SOLN
Freq: Once | INTRAVENOUS | Status: AC
  Administered 2017-04-03: 10:00:00 via INTRAVENOUS
  Filled 2017-04-03: qty 1000

## 2017-04-03 MED ORDER — PACLITAXEL CHEMO INJECTION 300 MG/50ML
45.0000 mg/m2 | Freq: Once | INTRAVENOUS | Status: AC
  Administered 2017-04-03: 90 mg via INTRAVENOUS
  Filled 2017-04-03: qty 15

## 2017-04-03 MED ORDER — PALONOSETRON HCL INJECTION 0.25 MG/5ML
0.2500 mg | Freq: Once | INTRAVENOUS | Status: AC
  Administered 2017-04-03: 0.25 mg via INTRAVENOUS
  Filled 2017-04-03: qty 5

## 2017-04-03 MED ORDER — SODIUM CHLORIDE 0.9% FLUSH
10.0000 mL | INTRAVENOUS | Status: AC | PRN
  Administered 2017-04-03: 10 mL via INTRAVENOUS
  Filled 2017-04-03: qty 10

## 2017-04-03 MED ORDER — NICOTINE 21-14-7 MG/24HR TD KIT: 1.0000 | PACK | Freq: Every day | TRANSDERMAL | 0 refills | Status: DC

## 2017-04-03 NOTE — Progress Notes (Signed)
DISCONTINUE ON PATHWAY REGIMEN - Non-Small Cell Lung     Administer weekly:     Paclitaxel      Carboplatin   **Always confirm dose/schedule in your pharmacy ordering system**    REASON: Continuation Of Treatment PRIOR TREATMENT: [id number]: Carboplatin AUC=2 + Paclitaxel 45 mg/m2 Weekly During Radiation TREATMENT RESPONSE: Stable Disease (SD)  START ON PATHWAY REGIMEN - Non-Small Cell Lung     A cycle is every 14 days:     Durvalumab   **Always confirm dose/schedule in your pharmacy ordering system**    Patient Characteristics: Stage III - Unresectable, PS = 0, 1 AJCC T Category: T2b Current Disease Status: No Distant Mets or Local Recurrence AJCC N Category: N3 AJCC M Category: M0 AJCC 8 Stage Grouping: IIIB Performance Status: PS = 0, 1 Intent of Therapy: Curative Intent, Discussed with Patient

## 2017-04-03 NOTE — Progress Notes (Signed)
Hematology/Oncology Follow Up Note [facility] Telephone:([phone] Fax:(336) [phone]   Patient Care Team: [name], MD as PCP - General (Family Medicine) [name], [name], MD (General Surgery) [name], MD as Consulting Physician (Oncology) [name], RN as Registered Nurse  Reason for Visit: follow up for treatment of lung cancer  HISTORY OF PRESENTING ILLNESS:  Marc Gray 71 y.o.  male who presents for treatment of lung cancer Patient felt a neck mass for about 7-8 months and lately also atypical chest pain and he went to see primary care physician [place]. CT neck was done which showed multiple cervical/supraclavical lymphadenopathy as well as left upper lobe mass. 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   Colon CA was listed in his history, however reviewing his surgical pathology from colonoscopy on 04/15/2014, during which he had polyps removed and all are negative for cancer.patient is scheduled for colonoscopy screening this year.  INTERVAL HISTORY Patient with above history is accompanied by his wife and presents for evaluation prior to concurrent chemoradiation therapy for his stage Gray B lung cancer.  He tolerated 4 weekly treatment of carbo and Taxol concurrent with radiation well without significant side effects. He has gain weight. He report good appetite and snacks a lot.  Deneis hemoptysis. He denies experiencing any nausea vomiting. He takes pantoprazole for acid reflux. Continue to smoke about 1 pack a day but interested in further cutting down the smoking.   ROS:  Constitutional: Negative for fever, night sweats,change in appetite.(+) Weight gain HENT: Negative for ear pain, hearing loss, nasal bleeding Eyes: Negative for eye pain, double vision   Respiratory: Negative for wheezing, shortness of breath, (+) cough Cardiovascular: Negative for chest pain, palpitation.   Gastrointestinal:  Negative abdominal pain, diarrhea, nausea vomiting Endocrine: Negative  Genitourinary: Negative for dysuria, hematuria, frequency Skin: Negative for rash, iching, bruising Neurological: Negative for headache, dizziness, seizure Hematological: Negative for easy bruising/bleeding, lymph node enlargement Psychiatric/Behavioral: Negative for depression, anxiety, suicidality  MEDICAL HISTORY:  Past Medical History:  Diagnosis Date  . Anxiety   . Arthritis    SHOULDER  . Cataract   . Colon cancer ([place]) 2015   Pt states during his colonoscopy he had cancerous polyps removed.   . Depression    SINCE DIAGNOSIS  . Dyspnea    DOE  . Enlarged prostate   . Pain    DUE TO LUNG ISSUE    SURGICAL HISTORY: Past Surgical History:  Procedure Laterality Date  . CATARACT EXTRACTION W/ INTRAOCULAR LENS  IMPLANT, BILATERAL    . COLONOSCOPY    . EYE SURGERY    . PROSTATE SURGERY    . TONSILLECTOMY      SOCIAL HISTORY: Social History   Socioeconomic History  . Marital status: Married    Spouse name: Not on file  . Number of children: Not on file  . Years of education: Not on file  . Highest education level: Not on file  Social Needs  . Financial resource strain: Not on file  . Food insecurity - worry: Not on file  . Food insecurity - inability: Not on file  . Transportation needs - medical: Not on file  . Transportation needs - non-medical: Not on file  Occupational History  . Not on file  Tobacco Use  . Smoking status: Current Every Day Smoker    Packs/day: 1.00    Years: 53.00    Pack years: 53.00    Types: Cigarettes  .  Smokeless tobacco: Never Used  Substance and Sexual Activity  . Alcohol use: Yes    Comment: occassional  . Drug use: No  . Sexual activity: Yes  Other Topics Concern  . Not on file  Social History Narrative  . Not on file    FAMILY HISTORY: Family History  Problem Relation Age of Onset  . Breast cancer Maternal Grandmother   . Dementia Mother   .  Diabetes Father   . Stroke Father     ALLERGIES:  has No Known Allergies.  MEDICATIONS:  Current Outpatient Medications  Medication Sig Dispense Refill  . aspirin 325 MG tablet Take 650 mg by mouth daily.     . Chlorpheniramine-Phenylephrine (SINUS & ALLERGY) 4-10 MG tablet Take 1 tablet by mouth daily as needed.     . lidocaine-prilocaine (EMLA) cream Apply over port 1-2 hours prior to chemotherapy treatment.  Cover with plastic wrap. 30 g 1  . Multiple Vitamin (MULTI-VITAMINS) TABS Take 1 tablet by mouth daily.     . ondansetron (ZOFRAN) 8 MG tablet Take 1 tablet (8 mg total) by mouth every 8 (eight) hours as needed for nausea or vomiting. 60 tablet 1  . prochlorperazine (COMPAZINE) 10 MG tablet Take 1 tablet (10 mg total) by mouth every 6 (six) hours as needed for nausea or vomiting. 60 tablet 2  . sucralfate (CARAFATE) 1 g tablet Take 1 tablet (1 g total) by mouth 3 (three) times daily. Dissolve in 2-3 tbsp warm water, swish and swallow 90 tablet 3  . Nicotine 21-14-7 MG/24HR KIT Place 1 patch daily onto the skin. 1 each 0   No current facility-administered medications for this visit.    Facility-Administered Medications Ordered in Other Visits  Medication Dose Route Frequency Provider Last Rate Last Dose  . sodium chloride flush (NS) 0.9 % injection 10 mL  10 mL Intravenous PRN [name], MD   10 mL at 04/03/17 0623      .  PHYSICAL EXAMINATION: ECOG PERFORMANCE STATUS: 0 - Asymptomatic Vitals:   04/03/17 0935  BP: 135/80  Pulse: 63  Resp: 18  Temp: 97.6 F (36.4 C)   Filed Weights   04/03/17 0935  Weight: 187 lb 6.3 oz (85 kg)  GENERAL: No distress, well nourished.  SKIN:  No rashes or significant lesions  HEAD: Normocephalic, No masses, lesions, tenderness or abnormalities  EYES: Conjunctiva are pink, non icteric ENT: External ears normal ,lips , buccal mucosa, and tongue normal and mucous membranes are moist  LYMPH: left supraclavicular lymph adenopathy.  LUNGS:  Clear to auscultation, no crackles or wheezes HEART: Regular rate & rhythm, no murmurs, no gallops, S1 normal and S2 normal  ABDOMEN: Abdomen soft, non-tender, normal bowel sounds, I did not appreciate any  masses or organomegaly  MUSCULOSKELETAL: No CVA tenderness and no tenderness on percussion of the back or rib cage.  EXTREMITIES: No edema, no skin discoloration or tenderness NEURO: Alert & oriented, no focal motor/sensory deficits.  LABORATORY DATA:  I have reviewed the data as listed Lab Results  Component Value Date   WBC 4.3 03/27/2017   HGB 13.8 03/27/2017   HCT 40.7 03/27/2017   MCV 92.4 03/27/2017   PLT 158 03/27/2017   Recent Labs    03/13/17 1020 03/20/17 0948 03/27/17 0909  NA 134* 134* 136  K 4.0 4.6 4.2  CL 101 101 103  CO2 25 20* 24  GLUCOSE 111* 103* 86  BUN 21* 24* 25*  CREATININE 1.17 1.07 1.33*  CALCIUM 9.0 9.0 8.9  GFRNONAA >60 >60 53*  GFRAA >60 >60 >60  PROT 6.7 6.8 6.6  ALBUMIN 3.6 3.6 3.6  AST _0 ALT _1 ALKPHOS 51 53 54  BILITOT 0.8 0.7 0.8    ASSESSMENT & PLAN:  Cancer Staging Adenocarcinoma of left lung, stage 3 (HCC) Staging form: Lung, AJCC 8th Edition - Clinical stage from 02/14/2017: Stage IIIB (cT2b, cN3, cM0) - Signed by [name], MD on 02/14/2017  1. Adenocarcinoma of left lung, stage 3 ([place])   2. Encounter for antineoplastic chemotherapy   3. Cervical lymphadenopathy   4. Tobacco abuse counseling   5. Thrombocytopenia ([place])      #Okay to proceed cycle 5 concurrent Carbo Taxol with radiation.Marland KitchenHe is on daily radiation Monday through Friday,  last day of radiation scheduled as 11/13.  # Grade 1 thrombocytopenia, chemotherapy induced. Continue monitor weekly cbc.  # Advice patient to continue take pantoprazole for acid reflux. # smoke cessation is discussed with patient in detail. Cessation program information was given to patient at last visit. He is motivated. Prescribed him Nicotin patch kit.  # follow up in 1 week  for last weekly carbo taxol. Plan CT chest to establish new baseline before durvalumab in 3 weeks.  All questions were answered. The patient knows to call the clinic with any problems questions or concerns. Return of visit: 1 week with labs and carbo/taxol.  [name], MD, PhD Hematology Oncology [facility] at [facility] Pager- [phone] 04/03/2017

## 2017-04-03 NOTE — Progress Notes (Signed)
Pt in today for follow up, lab and chemo today. Reports no concerns or difficulties today.  Energy level fair, appetite 75%.

## 2017-04-04 ENCOUNTER — Ambulatory Visit
Admission: RE | Admit: 2017-04-04 | Discharge: 2017-04-04 | Disposition: A | Discharge status: Home / Self Care | Payer: PPO | Source: Ambulatory Visit | Attending: Radiation Oncology | Admitting: Radiation Oncology

## 2017-04-04 ENCOUNTER — Telehealth: Payer: Self-pay | Admitting: *Deleted

## 2017-04-04 DIAGNOSIS — Z510 Encounter for antineoplastic radiation therapy: Secondary | ICD-10-CM | POA: Diagnosis not present

## 2017-04-04 DIAGNOSIS — F17210 Nicotine dependence, cigarettes, uncomplicated: Secondary | ICD-10-CM | POA: Diagnosis not present

## 2017-04-04 DIAGNOSIS — C778 Secondary and unspecified malignant neoplasm of lymph nodes of multiple regions: Secondary | ICD-10-CM | POA: Diagnosis not present

## 2017-04-04 DIAGNOSIS — C3412 Malignant neoplasm of upper lobe, left bronchus or lung: Secondary | ICD-10-CM | POA: Diagnosis not present

## 2017-04-04 NOTE — Telephone Encounter (Signed)
Need clarification of directions for Nicotine patch. Please call with appropriate instruction

## 2017-04-04 NOTE — Telephone Encounter (Signed)
Called the pharmacy and they don't have the nicotine patch kit. Called in prescription of Nicotin patch 89m daily x 3 weeks, and 146mdaily x 2 weeks and then 37m75maily x 2 weeks. Then stop.

## 2017-04-05 ENCOUNTER — Other Ambulatory Visit: Payer: Self-pay | Admitting: *Deleted

## 2017-04-05 ENCOUNTER — Ambulatory Visit
Admission: RE | Admit: 2017-04-05 | Discharge: 2017-04-05 | Disposition: A | Discharge status: Home / Self Care | Payer: PPO | Source: Ambulatory Visit | Attending: Radiation Oncology | Admitting: Radiation Oncology

## 2017-04-05 DIAGNOSIS — F17210 Nicotine dependence, cigarettes, uncomplicated: Secondary | ICD-10-CM | POA: Diagnosis not present

## 2017-04-05 DIAGNOSIS — C778 Secondary and unspecified malignant neoplasm of lymph nodes of multiple regions: Secondary | ICD-10-CM | POA: Diagnosis not present

## 2017-04-05 DIAGNOSIS — Z510 Encounter for antineoplastic radiation therapy: Secondary | ICD-10-CM | POA: Diagnosis not present

## 2017-04-05 DIAGNOSIS — C3492 Malignant neoplasm of unspecified part of left bronchus or lung: Secondary | ICD-10-CM

## 2017-04-05 DIAGNOSIS — C3412 Malignant neoplasm of upper lobe, left bronchus or lung: Secondary | ICD-10-CM | POA: Diagnosis not present

## 2017-04-06 ENCOUNTER — Ambulatory Visit
Admission: RE | Admit: 2017-04-06 | Discharge: 2017-04-06 | Disposition: A | Discharge status: Home / Self Care | Payer: PPO | Source: Ambulatory Visit | Attending: Radiation Oncology | Admitting: Radiation Oncology

## 2017-04-06 DIAGNOSIS — C778 Secondary and unspecified malignant neoplasm of lymph nodes of multiple regions: Secondary | ICD-10-CM | POA: Diagnosis not present

## 2017-04-06 DIAGNOSIS — Z510 Encounter for antineoplastic radiation therapy: Secondary | ICD-10-CM | POA: Diagnosis not present

## 2017-04-06 DIAGNOSIS — F17210 Nicotine dependence, cigarettes, uncomplicated: Secondary | ICD-10-CM | POA: Diagnosis not present

## 2017-04-06 DIAGNOSIS — C3412 Malignant neoplasm of upper lobe, left bronchus or lung: Secondary | ICD-10-CM | POA: Diagnosis not present

## 2017-04-09 ENCOUNTER — Ambulatory Visit
Admission: RE | Admit: 2017-04-09 | Discharge: 2017-04-09 | Disposition: A | Discharge status: Home / Self Care | Payer: PPO | Source: Ambulatory Visit | Attending: Radiation Oncology | Admitting: Radiation Oncology

## 2017-04-09 DIAGNOSIS — C3412 Malignant neoplasm of upper lobe, left bronchus or lung: Secondary | ICD-10-CM | POA: Diagnosis not present

## 2017-04-09 DIAGNOSIS — Z510 Encounter for antineoplastic radiation therapy: Secondary | ICD-10-CM | POA: Diagnosis not present

## 2017-04-09 DIAGNOSIS — C778 Secondary and unspecified malignant neoplasm of lymph nodes of multiple regions: Secondary | ICD-10-CM | POA: Diagnosis not present

## 2017-04-09 DIAGNOSIS — F17210 Nicotine dependence, cigarettes, uncomplicated: Secondary | ICD-10-CM | POA: Diagnosis not present

## 2017-04-09 NOTE — Progress Notes (Signed)
Hematology/Oncology Follow Up Note [facility] Telephone:([phone] Fax:([phone]   Patient Care Team: [name], MD as PCP - General (Family Medicine) [name], [name], MD (General Surgery) [name], MD as Consulting Physician (Oncology) [name], RN as Registered Nurse  Reason for Visit: follow up for treatment of lung cancer  HISTORY OF PRESENTING ILLNESS:  Marc Gray 71 y.o.  male who presents for treatment of lung cancer Patient felt a neck mass for about 7-8 months and lately also atypical chest pain and he went to see primary care physician [place]. CT neck was done which showed multiple cervical/supraclavical lymphadenopathy as well as left upper lobe mass. 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   Colon CA was listed in his history, however reviewing his surgical pathology from colonoscopy on 04/15/2014, during which he had polyps removed and all are negative for cancer.patient is scheduled for colonoscopy screening this year.  INTERVAL HISTORY Patient with above history is accompanied by his wife and presents for evaluation prior to concurrent chemoradiation therapy for his stage Gray B lung cancer.  He tolerated cycle 1 carbo and Taxol concurrent with radiation well without significant side effects.  He has chronic cough with some phlegm, no hemoptysis. He denies experiencing any nausea vomiting. Appetite is fair. He reports that he has some heartburn and as instructed by Dr. [name] to take pantoprazole Denies SOB, chest pain, abdominal pain. Patient has 53 pack year smoking history and currently daily smoker. He continues to smoke but has decreased his daily cigarette consumption to about 17 cigarettes daily and plan to continue decrease numbers until he quits.   Patient reports acid reflux symptoms. He takes pantoprazole. He also reports a mild burning sensation with swallowing. Otherwise doing  well, no shortness of breath, chest pain, abdominal pain, neck swelling. Denies any fever or chills. Denies any nausea or vomiting.  Review of Systems - Oncology Constitutional: Negative for fever, night sweats,unintentional weight loss, change in appetite. HENT: Negative for ear pain, hearing loss, nasal bleeding Eyes: Negative for eye pain, double vision   Respiratory: Negative for wheezing, shortness of breath, (+) cough Cardiovascular: Negative for chest pain, palpitation.   Gastrointestinal: Negative abdominal pain, diarrhea, nausea vomiting. positive heartburn Endocrine: Negative  Genitourinary: Negative for dysuria, hematuria, frequency Skin: Negative for rash, iching, bruising Neurological: Negative for headache, dizziness, seizure Hematological: Negative for easy bruising/bleeding, lymph node enlargement Psychiatric/Behavioral: Negative for depression, anxiety, suicidality  MEDICAL HISTORY:  Past Medical History:  Diagnosis Date  . Anxiety   . Arthritis    SHOULDER  . Cataract   . Colon cancer ([place]) 2015   Pt states during his colonoscopy he had cancerous polyps removed.   . Depression    SINCE DIAGNOSIS  . Dyspnea    DOE  . Enlarged prostate   . Pain    DUE TO LUNG ISSUE    SURGICAL HISTORY: Past Surgical History:  Procedure Laterality Date  . CATARACT EXTRACTION W/ INTRAOCULAR LENS  IMPLANT, BILATERAL    . COLONOSCOPY    . EYE SURGERY    . PROSTATE SURGERY    . TONSILLECTOMY      SOCIAL HISTORY: Social History   Socioeconomic History  . Marital status: Married    Spouse name: Not on file  . Number of children: Not on file  . Years of education: Not on file  . Highest education level: Not on file  Social Needs  . Financial resource strain: Not on  file  . Food insecurity - worry: Not on file  . Food insecurity - inability: Not on file  . Transportation needs - medical: Not on file  . Transportation needs - non-medical: Not on file  Occupational  History  . Not on file  Tobacco Use  . Smoking status: Current Every Day Smoker    Packs/day: 1.00    Years: 53.00    Pack years: 53.00    Types: Cigarettes  . Smokeless tobacco: Never Used  Substance and Sexual Activity  . Alcohol use: Yes    Comment: occassional  . Drug use: No  . Sexual activity: Yes  Other Topics Concern  . Not on file  Social History Narrative  . Not on file    FAMILY HISTORY: Family History  Problem Relation Age of Onset  . Breast cancer Maternal Grandmother   . Dementia Mother   . Diabetes Father   . Stroke Father     ALLERGIES:  has No Known Allergies.  MEDICATIONS:  Current Outpatient Medications  Medication Sig Dispense Refill  . aspirin 325 MG tablet Take 650 mg by mouth daily.     . Chlorpheniramine-Phenylephrine (SINUS & ALLERGY) 4-10 MG tablet Take 1 tablet by mouth daily as needed.     . lidocaine-prilocaine (EMLA) cream Apply over port 1-2 hours prior to chemotherapy treatment.  Cover with plastic wrap. 30 g 1  . Multiple Vitamin (MULTI-VITAMINS) TABS Take 1 tablet by mouth daily.     . Nicotine 21-14-7 MG/24HR KIT Place 1 patch daily onto the skin. 1 each 0  . ondansetron (ZOFRAN) 8 MG tablet Take 1 tablet (8 mg total) by mouth every 8 (eight) hours as needed for nausea or vomiting. 60 tablet 1  . prochlorperazine (COMPAZINE) 10 MG tablet Take 1 tablet (10 mg total) by mouth every 6 (six) hours as needed for nausea or vomiting. 60 tablet 2  . sucralfate (CARAFATE) 1 g tablet Take 1 tablet (1 g total) by mouth 3 (three) times daily. Dissolve in 2-3 tbsp warm water, swish and swallow 90 tablet 3   No current facility-administered medications for this visit.    Facility-Administered Medications Ordered in Other Visits  Medication Dose Route Frequency Provider Last Rate Last Dose  . sodium chloride flush (NS) 0.9 % injection 10 mL  10 mL Intravenous PRN [name], MD   10 mL at 04/03/17 7628      .  PHYSICAL EXAMINATION: ECOG  PERFORMANCE STATUS: 0 - Asymptomatic Vitals:   04/10/17 0927 04/10/17 0934  BP: 135/81   Pulse: 65   Temp: (!) 96 F (35.6 C)   SpO2:  99%   Filed Weights   04/10/17 0927  Weight: 177 lb 14.6 oz (80.7 kg)  GENERAL: No distress, well nourished.  SKIN:  No rashes or significant lesions  HEAD: Normocephalic, No masses, lesions, tenderness or abnormalities  EYES: Conjunctiva are pink, non icteric ENT: External ears normal ,lips , buccal mucosa, and tongue normal and mucous membranes are moist. (+) thrush LYMPH: left supraclavicular lymph adenopathy.  LUNGS: Clear to auscultation, no crackles or wheezes. Decreased breath sound at the bases bilaterally HEART: Regular rate & rhythm, no murmurs, no gallops, S1 normal and S2 normal  ABDOMEN: Abdomen soft, non-tender, normal bowel sounds, I did not appreciate any  masses or organomegaly  MUSCULOSKELETAL: No CVA tenderness and no tenderness on percussion of the back or rib cage.  EXTREMITIES: No edema, no skin discoloration or tenderness NEURO: Alert & oriented, no  focal motor/sensory deficits.  LABORATORY DATA:  I have reviewed the data as listed Lab Results  Component Value Date   WBC 3.6 (L) 04/03/2017   HGB 13.0 04/03/2017   HCT 38.7 (L) 04/03/2017   MCV 92.0 04/03/2017   PLT 102 (L) 04/03/2017   Recent Labs    03/20/17 0948 03/27/17 0909 04/03/17 0837  NA 134* 136 134*  K 4.6 4.2 4.0  CL 101 103 104  CO2 20* 24 24  GLUCOSE 103* 86 100*  BUN 24* 25* 21*  CREATININE 1.07 1.33* 1.23  CALCIUM 9.0 8.9 8.8*  GFRNONAA >60 53* 58*  GFRAA >60 >60 >60  PROT 6.8 6.6 6.5  ALBUMIN 3.6 3.6 3.5  AST 18 24 22   ALT 19 28 22   ALKPHOS 53 54 50  BILITOT 0.7 0.8 0.7    ASSESSMENT & PLAN:  Cancer Staging Adenocarcinoma of left lung, stage 3 (HCC) Staging form: Lung, AJCC 8th Edition - Clinical stage from 02/14/2017: Stage IIIB (cT2b, cN3, cM0) - Signed by [name], MD on 02/14/2017  1. Malignant neoplasm of lung, unspecified  laterality, unspecified part of lung ([place])   2. Encounter for antineoplastic chemotherapy   3. Thrush   4. Gastroesophageal reflux disease, esophagitis presence not specified   5. Thrombocytopenia ([place])      # Okay to proceed cycle 6 concurrent Carbo Taxol with radiation.Marland KitchenHe is on daily radiation Monday through Friday,  last day of radiation scheduled as today.  # continue take pantoprazole for acid reflux. # add nystatin mouth wash to swish and swallow.  # CT chest w contrast prior to starting consolidation Durvaluamab.  # Chemotherapy induced thrombocytopenia: continue monitor. Ok to proceed today's treatment.  # smoke cessation is discussed with patient in detail. He started Nicotin patch 3m daily.  All questions were answered. The patient knows to call the clinic with any problems questions or concerns. Return of visit:    ZEarlie Server MD, PhD Hematology [facility] CenterPager- [phone]/04/2017

## 2017-04-10 ENCOUNTER — Ambulatory Visit
Admission: RE | Admit: 2017-04-10 | Discharge: 2017-04-10 | Disposition: A | Discharge status: Home / Self Care | Payer: PPO | Source: Ambulatory Visit | Attending: Radiation Oncology | Admitting: Radiation Oncology

## 2017-04-10 ENCOUNTER — Inpatient Hospital Stay: Payer: PPO

## 2017-04-10 ENCOUNTER — Encounter: Payer: Self-pay | Admitting: Oncology

## 2017-04-10 ENCOUNTER — Other Ambulatory Visit: Payer: Self-pay

## 2017-04-10 ENCOUNTER — Inpatient Hospital Stay (HOSPITAL_BASED_OUTPATIENT_CLINIC_OR_DEPARTMENT_OTHER): Payer: PPO | Admitting: Oncology

## 2017-04-10 VITALS — BP 135/81 | HR 65 | Temp 96.0°F | Wt 177.9 lb

## 2017-04-10 DIAGNOSIS — C3492 Malignant neoplasm of unspecified part of left bronchus or lung: Secondary | ICD-10-CM

## 2017-04-10 DIAGNOSIS — B370 Candidal stomatitis: Secondary | ICD-10-CM | POA: Diagnosis not present

## 2017-04-10 DIAGNOSIS — K219 Gastro-esophageal reflux disease without esophagitis: Secondary | ICD-10-CM

## 2017-04-10 DIAGNOSIS — Z510 Encounter for antineoplastic radiation therapy: Secondary | ICD-10-CM | POA: Diagnosis not present

## 2017-04-10 DIAGNOSIS — F17210 Nicotine dependence, cigarettes, uncomplicated: Secondary | ICD-10-CM | POA: Diagnosis not present

## 2017-04-10 DIAGNOSIS — C3412 Malignant neoplasm of upper lobe, left bronchus or lung: Secondary | ICD-10-CM | POA: Diagnosis not present

## 2017-04-10 DIAGNOSIS — D696 Thrombocytopenia, unspecified: Secondary | ICD-10-CM | POA: Diagnosis not present

## 2017-04-10 DIAGNOSIS — C778 Secondary and unspecified malignant neoplasm of lymph nodes of multiple regions: Secondary | ICD-10-CM | POA: Diagnosis not present

## 2017-04-10 DIAGNOSIS — Z5111 Encounter for antineoplastic chemotherapy: Secondary | ICD-10-CM

## 2017-04-10 DIAGNOSIS — C3490 Malignant neoplasm of unspecified part of unspecified bronchus or lung: Secondary | ICD-10-CM | POA: Diagnosis not present

## 2017-04-10 LAB — CBC WITH DIFFERENTIAL/PLATELET
Basophils Absolute: 0 10*3/uL (ref 0–0.1)
Basophils Relative: 0 %
EOS PCT: 2 %
Eosinophils Absolute: 0 10*3/uL (ref 0–0.7)
HCT: 35.6 % — ABNORMAL LOW (ref 40.0–52.0)
HEMOGLOBIN: 12.1 g/dL — AB (ref 13.0–18.0)
LYMPHS ABS: 0.3 10*3/uL — AB (ref 1.0–3.6)
LYMPHS PCT: 13 %
MCH: 31.3 pg (ref 26.0–34.0)
MCHC: 34.1 g/dL (ref 32.0–36.0)
MCV: 91.9 fL (ref 80.0–100.0)
MONOS PCT: 11 %
Monocytes Absolute: 0.3 10*3/uL (ref 0.2–1.0)
Neutro Abs: 1.9 10*3/uL (ref 1.4–6.5)
Neutrophils Relative %: 74 %
Platelets: 90 10*3/uL — ABNORMAL LOW (ref 150–440)
RBC: 3.87 MIL/uL — AB (ref 4.40–5.90)
RDW: 14.1 % (ref 11.5–14.5)
WBC: 2.6 10*3/uL — AB (ref 3.8–10.6)

## 2017-04-10 LAB — COMPREHENSIVE METABOLIC PANEL
ALBUMIN: 3.3 g/dL — AB (ref 3.5–5.0)
ALT: 27 U/L (ref 17–63)
AST: 25 U/L (ref 15–41)
Alkaline Phosphatase: 49 U/L (ref 38–126)
Anion gap: 4 — ABNORMAL LOW (ref 5–15)
BUN: 29 mg/dL — AB (ref 6–20)
CHLORIDE: 106 mmol/L (ref 101–111)
CO2: 26 mmol/L (ref 22–32)
Calcium: 8.7 mg/dL — ABNORMAL LOW (ref 8.9–10.3)
Creatinine, Ser: 1.06 mg/dL (ref 0.61–1.24)
GFR calc Af Amer: >60 mL/min (ref >60)
Glucose, Bld: 107 mg/dL — ABNORMAL HIGH (ref 65–99)
POTASSIUM: 4.1 mmol/L (ref 3.5–5.1)
SODIUM: 136 mmol/L (ref 135–145)
Total Bilirubin: 0.8 mg/dL (ref 0.3–1.2)
Total Protein: 6.1 g/dL — ABNORMAL LOW (ref 6.5–8.1)

## 2017-04-10 MED ORDER — SODIUM CHLORIDE 0.9 % IV SOLN
20.0000 mg | Freq: Once | INTRAVENOUS | Status: AC
  Administered 2017-04-10: 20 mg via INTRAVENOUS
  Filled 2017-04-10: qty 2

## 2017-04-10 MED ORDER — FAMOTIDINE IN NACL 20-0.9 MG/50ML-% IV SOLN
20.0000 mg | Freq: Once | INTRAVENOUS | Status: AC
  Administered 2017-04-10: 20 mg via INTRAVENOUS
  Filled 2017-04-10: qty 50

## 2017-04-10 MED ORDER — SODIUM CHLORIDE 0.9 % IV SOLN
Freq: Once | INTRAVENOUS | Status: AC
  Administered 2017-04-10: 11:00:00 via INTRAVENOUS
  Filled 2017-04-10: qty 1000

## 2017-04-10 MED ORDER — DIPHENHYDRAMINE HCL 50 MG/ML IJ SOLN
50.0000 mg | Freq: Once | INTRAMUSCULAR | Status: AC
  Administered 2017-04-10: 50 mg via INTRAVENOUS
  Filled 2017-04-10: qty 1

## 2017-04-10 MED ORDER — SODIUM CHLORIDE 0.9 % IV SOLN
180.0000 mg | Freq: Once | INTRAVENOUS | Status: AC
  Administered 2017-04-10: 180 mg via INTRAVENOUS
  Filled 2017-04-10: qty 18

## 2017-04-10 MED ORDER — PALONOSETRON HCL INJECTION 0.25 MG/5ML
0.2500 mg | Freq: Once | INTRAVENOUS | Status: AC
  Administered 2017-04-10: 0.25 mg via INTRAVENOUS
  Filled 2017-04-10: qty 5

## 2017-04-10 MED ORDER — NYSTATIN 100000 UNIT/ML MT SUSP: 5.0000 mL | Freq: Four times a day (QID) | OROMUCOSAL | 0 refills | Status: DC

## 2017-04-10 MED ORDER — PACLITAXEL CHEMO INJECTION 300 MG/50ML
45.0000 mg/m2 | Freq: Once | INTRAVENOUS | Status: AC
  Administered 2017-04-10: 90 mg via INTRAVENOUS
  Filled 2017-04-10: qty 15

## 2017-04-10 MED ORDER — HEPARIN SOD (PORK) LOCK FLUSH 100 UNIT/ML IV SOLN
500.0000 [IU] | Freq: Once | INTRAVENOUS | Status: AC | PRN
  Administered 2017-04-10: 500 [IU]
  Filled 2017-04-10: qty 5

## 2017-04-10 NOTE — Progress Notes (Signed)
Patient here today for follow up and chemo

## 2017-04-10 NOTE — Progress Notes (Signed)
Dr. [name] gave ok to proceed with today's treatment with platelets of 90.

## 2017-04-10 NOTE — Progress Notes (Signed)
Plt =90.  MD ok to proceed with treatment today

## 2017-04-16 ENCOUNTER — Other Ambulatory Visit: Payer: Self-pay

## 2017-04-16 ENCOUNTER — Encounter: Payer: Self-pay | Admitting: Radiation Oncology

## 2017-04-16 ENCOUNTER — Ambulatory Visit
Admission: RE | Admit: 2017-04-16 | Discharge: 2017-04-16 | Disposition: A | Discharge status: Home / Self Care | Payer: PPO | Source: Ambulatory Visit | Attending: Radiation Oncology | Admitting: Radiation Oncology

## 2017-04-16 VITALS — BP 110/70 | HR 76 | Temp 98.7°F | Resp 20 | Wt 175.6 lb

## 2017-04-16 DIAGNOSIS — F17210 Nicotine dependence, cigarettes, uncomplicated: Secondary | ICD-10-CM | POA: Insufficient documentation

## 2017-04-16 DIAGNOSIS — Z923 Personal history of irradiation: Secondary | ICD-10-CM | POA: Insufficient documentation

## 2017-04-16 DIAGNOSIS — C3492 Malignant neoplasm of unspecified part of left bronchus or lung: Secondary | ICD-10-CM | POA: Diagnosis not present

## 2017-04-16 DIAGNOSIS — R1310 Dysphagia, unspecified: Secondary | ICD-10-CM | POA: Diagnosis not present

## 2017-04-16 NOTE — Progress Notes (Signed)
Radiation Oncology Follow up Note  Name: Marc Gray   Date:   04/16/2017 MRN:  [id number] DOB: February 05, 1946    This 71 y.o. male presents to the clinic today for on 1 week break for stage IIIB (T2 be N3 M0) adenocarcinoma of the lung being treated with concurrent chemoradiation  REFERRING PROVIDER: [name], MD  HPI: Patient is being treated with split course radiation therapy for locally advanced stage IIIB adenocarcinoma of the left lung. He was having some significant dysphagia which seems to be resolving on Carafate rinses. He otherwise is doing well does have some productive cough at times aren't tinted in nature. He otherwise is doing well no change in pulmonary status..  COMPLICATIONS OF TREATMENT: none  FOLLOW UP COMPLIANCE: keeps appointments   PHYSICAL EXAM:  BP 110/70   Pulse 76   Temp 98.7 F (37.1 C)   Resp 20   Wt 175 lb 9.5 oz (79.6 kg)   BMI 23.82 kg/m  Well-developed well-nourished patient in NAD. HEENT reveals PERLA, EOMI, discs not visualized.  Oral cavity is clear. No oral mucosal lesions are identified. Neck is clear without evidence of cervical or supraclavicular adenopathy. Lungs are clear to A&P. Cardiac examination is essentially unremarkable with regular rate and rhythm without murmur rub or thrill. Abdomen is benign with no organomegaly or masses noted. Motor sensory and DTR levels are equal and symmetric in the upper and lower extremities. Cranial nerves II through XII are grossly intact. Proprioception is intact. No peripheral adenopathy or edema is identified. No motor or sensory levels are noted. Crude visual fields are within normal range.  RADIOLOGY RESULTS: No current films for review  PLAN: At this time I like to go ahead with a CT scan for small field lung boost. Should we see good response which I expected go ahead with another 2 and half weeks of treatment. Risks and benefits of continued small field lung boost were again  reviewed with the patient and his wife. I first ordered and set up CT simulation for next week.  I would like to take this opportunity to thank you for allowing me to participate in the care of your patient.Armstead Peaks., MD

## 2017-04-24 ENCOUNTER — Ambulatory Visit
Admission: RE | Admit: 2017-04-24 | Discharge: 2017-04-24 | Disposition: A | Discharge status: Home / Self Care | Payer: PPO | Source: Ambulatory Visit | Attending: Radiation Oncology | Admitting: Radiation Oncology

## 2017-04-24 DIAGNOSIS — F17210 Nicotine dependence, cigarettes, uncomplicated: Secondary | ICD-10-CM | POA: Diagnosis not present

## 2017-04-24 DIAGNOSIS — Z510 Encounter for antineoplastic radiation therapy: Secondary | ICD-10-CM | POA: Diagnosis not present

## 2017-04-24 DIAGNOSIS — C778 Secondary and unspecified malignant neoplasm of lymph nodes of multiple regions: Secondary | ICD-10-CM | POA: Diagnosis not present

## 2017-04-24 DIAGNOSIS — C3412 Malignant neoplasm of upper lobe, left bronchus or lung: Secondary | ICD-10-CM | POA: Diagnosis not present

## 2017-04-25 ENCOUNTER — Ambulatory Visit: Payer: PPO

## 2017-04-26 ENCOUNTER — Telehealth: Payer: Self-pay | Admitting: *Deleted

## 2017-04-26 NOTE — Telephone Encounter (Signed)
Per Hayley move 05/11/17 appt 1-2 days after CT scan schedule on 06/04/16  All appts. were Resched to 06/06/16 and patient was notified and  also a copy of new schedule was mailed out.

## 2017-05-01 ENCOUNTER — Ambulatory Visit: Payer: PPO | Admitting: Oncology

## 2017-05-01 ENCOUNTER — Inpatient Hospital Stay: Payer: PPO

## 2017-05-01 ENCOUNTER — Other Ambulatory Visit: Payer: PPO

## 2017-05-01 DIAGNOSIS — F17210 Nicotine dependence, cigarettes, uncomplicated: Secondary | ICD-10-CM | POA: Diagnosis not present

## 2017-05-01 DIAGNOSIS — C778 Secondary and unspecified malignant neoplasm of lymph nodes of multiple regions: Secondary | ICD-10-CM | POA: Diagnosis not present

## 2017-05-01 DIAGNOSIS — Z510 Encounter for antineoplastic radiation therapy: Secondary | ICD-10-CM | POA: Diagnosis not present

## 2017-05-01 DIAGNOSIS — C3412 Malignant neoplasm of upper lobe, left bronchus or lung: Secondary | ICD-10-CM | POA: Diagnosis not present

## 2017-05-03 ENCOUNTER — Ambulatory Visit
Admission: RE | Admit: 2017-05-03 | Discharge: 2017-05-03 | Disposition: A | Discharge status: Home / Self Care | Payer: PPO | Source: Ambulatory Visit | Attending: Radiation Oncology | Admitting: Radiation Oncology

## 2017-05-03 DIAGNOSIS — C778 Secondary and unspecified malignant neoplasm of lymph nodes of multiple regions: Secondary | ICD-10-CM | POA: Diagnosis not present

## 2017-05-03 DIAGNOSIS — F17210 Nicotine dependence, cigarettes, uncomplicated: Secondary | ICD-10-CM | POA: Diagnosis not present

## 2017-05-03 DIAGNOSIS — C3492 Malignant neoplasm of unspecified part of left bronchus or lung: Secondary | ICD-10-CM | POA: Diagnosis not present

## 2017-05-03 DIAGNOSIS — C3412 Malignant neoplasm of upper lobe, left bronchus or lung: Secondary | ICD-10-CM | POA: Diagnosis not present

## 2017-05-07 ENCOUNTER — Ambulatory Visit: Payer: PPO

## 2017-05-08 ENCOUNTER — Ambulatory Visit
Admission: RE | Admit: 2017-05-08 | Discharge: 2017-05-08 | Disposition: A | Discharge status: Home / Self Care | Payer: PPO | Source: Ambulatory Visit | Attending: Radiation Oncology | Admitting: Radiation Oncology

## 2017-05-08 DIAGNOSIS — C3412 Malignant neoplasm of upper lobe, left bronchus or lung: Secondary | ICD-10-CM | POA: Diagnosis not present

## 2017-05-08 DIAGNOSIS — C778 Secondary and unspecified malignant neoplasm of lymph nodes of multiple regions: Secondary | ICD-10-CM | POA: Diagnosis not present

## 2017-05-08 DIAGNOSIS — C3492 Malignant neoplasm of unspecified part of left bronchus or lung: Secondary | ICD-10-CM | POA: Diagnosis not present

## 2017-05-08 DIAGNOSIS — F17210 Nicotine dependence, cigarettes, uncomplicated: Secondary | ICD-10-CM | POA: Diagnosis not present

## 2017-05-09 ENCOUNTER — Ambulatory Visit
Admission: RE | Admit: 2017-05-09 | Discharge: 2017-05-09 | Disposition: A | Discharge status: Home / Self Care | Payer: PPO | Source: Ambulatory Visit | Attending: Radiation Oncology | Admitting: Radiation Oncology

## 2017-05-09 DIAGNOSIS — C3492 Malignant neoplasm of unspecified part of left bronchus or lung: Secondary | ICD-10-CM | POA: Diagnosis not present

## 2017-05-09 DIAGNOSIS — C778 Secondary and unspecified malignant neoplasm of lymph nodes of multiple regions: Secondary | ICD-10-CM | POA: Diagnosis not present

## 2017-05-09 DIAGNOSIS — C3412 Malignant neoplasm of upper lobe, left bronchus or lung: Secondary | ICD-10-CM | POA: Diagnosis not present

## 2017-05-09 DIAGNOSIS — F17210 Nicotine dependence, cigarettes, uncomplicated: Secondary | ICD-10-CM | POA: Diagnosis not present

## 2017-05-10 ENCOUNTER — Ambulatory Visit
Admission: RE | Admit: 2017-05-10 | Discharge: 2017-05-10 | Disposition: A | Discharge status: Home / Self Care | Payer: PPO | Source: Ambulatory Visit | Attending: Radiation Oncology | Admitting: Radiation Oncology

## 2017-05-10 DIAGNOSIS — F17210 Nicotine dependence, cigarettes, uncomplicated: Secondary | ICD-10-CM | POA: Diagnosis not present

## 2017-05-10 DIAGNOSIS — C3412 Malignant neoplasm of upper lobe, left bronchus or lung: Secondary | ICD-10-CM | POA: Diagnosis not present

## 2017-05-10 DIAGNOSIS — C778 Secondary and unspecified malignant neoplasm of lymph nodes of multiple regions: Secondary | ICD-10-CM | POA: Diagnosis not present

## 2017-05-10 DIAGNOSIS — C3492 Malignant neoplasm of unspecified part of left bronchus or lung: Secondary | ICD-10-CM | POA: Diagnosis not present

## 2017-05-11 ENCOUNTER — Ambulatory Visit
Admission: RE | Admit: 2017-05-11 | Discharge: 2017-05-11 | Disposition: A | Discharge status: Home / Self Care | Payer: PPO | Source: Ambulatory Visit | Attending: Radiation Oncology | Admitting: Radiation Oncology

## 2017-05-11 DIAGNOSIS — C778 Secondary and unspecified malignant neoplasm of lymph nodes of multiple regions: Secondary | ICD-10-CM | POA: Diagnosis not present

## 2017-05-11 DIAGNOSIS — F17210 Nicotine dependence, cigarettes, uncomplicated: Secondary | ICD-10-CM | POA: Diagnosis not present

## 2017-05-11 DIAGNOSIS — C3492 Malignant neoplasm of unspecified part of left bronchus or lung: Secondary | ICD-10-CM | POA: Diagnosis not present

## 2017-05-11 DIAGNOSIS — C3412 Malignant neoplasm of upper lobe, left bronchus or lung: Secondary | ICD-10-CM | POA: Diagnosis not present

## 2017-05-14 ENCOUNTER — Ambulatory Visit
Admission: RE | Admit: 2017-05-14 | Discharge: 2017-05-14 | Disposition: A | Discharge status: Home / Self Care | Payer: PPO | Source: Ambulatory Visit | Attending: Radiation Oncology | Admitting: Radiation Oncology

## 2017-05-14 DIAGNOSIS — F17210 Nicotine dependence, cigarettes, uncomplicated: Secondary | ICD-10-CM | POA: Diagnosis not present

## 2017-05-14 DIAGNOSIS — C778 Secondary and unspecified malignant neoplasm of lymph nodes of multiple regions: Secondary | ICD-10-CM | POA: Diagnosis not present

## 2017-05-14 DIAGNOSIS — C3412 Malignant neoplasm of upper lobe, left bronchus or lung: Secondary | ICD-10-CM | POA: Diagnosis not present

## 2017-05-14 DIAGNOSIS — C3492 Malignant neoplasm of unspecified part of left bronchus or lung: Secondary | ICD-10-CM | POA: Diagnosis not present

## 2017-05-15 ENCOUNTER — Ambulatory Visit
Admission: RE | Admit: 2017-05-15 | Discharge: 2017-05-15 | Disposition: A | Discharge status: Home / Self Care | Payer: PPO | Source: Ambulatory Visit | Attending: Radiation Oncology | Admitting: Radiation Oncology

## 2017-05-15 ENCOUNTER — Encounter: Payer: Self-pay | Admitting: Intensive Care

## 2017-05-15 ENCOUNTER — Emergency Department
Admission: EM | Admit: 2017-05-15 | Discharge: 2017-05-15 | Disposition: A | Discharge status: Home / Self Care | Payer: PPO | Attending: Emergency Medicine | Admitting: Emergency Medicine

## 2017-05-15 DIAGNOSIS — R531 Weakness: Secondary | ICD-10-CM | POA: Diagnosis present

## 2017-05-15 DIAGNOSIS — Z87891 Personal history of nicotine dependence: Secondary | ICD-10-CM | POA: Insufficient documentation

## 2017-05-15 DIAGNOSIS — R55 Syncope and collapse: Secondary | ICD-10-CM

## 2017-05-15 DIAGNOSIS — I951 Orthostatic hypotension: Secondary | ICD-10-CM | POA: Diagnosis not present

## 2017-05-15 DIAGNOSIS — C3412 Malignant neoplasm of upper lobe, left bronchus or lung: Secondary | ICD-10-CM | POA: Diagnosis not present

## 2017-05-15 DIAGNOSIS — C778 Secondary and unspecified malignant neoplasm of lymph nodes of multiple regions: Secondary | ICD-10-CM | POA: Diagnosis not present

## 2017-05-15 DIAGNOSIS — E860 Dehydration: Secondary | ICD-10-CM | POA: Insufficient documentation

## 2017-05-15 DIAGNOSIS — C3492 Malignant neoplasm of unspecified part of left bronchus or lung: Secondary | ICD-10-CM | POA: Diagnosis not present

## 2017-05-15 DIAGNOSIS — Z7982 Long term (current) use of aspirin: Secondary | ICD-10-CM | POA: Diagnosis not present

## 2017-05-15 DIAGNOSIS — Z79899 Other long term (current) drug therapy: Secondary | ICD-10-CM | POA: Diagnosis not present

## 2017-05-15 DIAGNOSIS — F17210 Nicotine dependence, cigarettes, uncomplicated: Secondary | ICD-10-CM | POA: Diagnosis not present

## 2017-05-15 DIAGNOSIS — D649 Anemia, unspecified: Secondary | ICD-10-CM | POA: Diagnosis not present

## 2017-05-15 LAB — CBC
HEMATOCRIT: 31.4 % — AB (ref 40.0–52.0)
HEMOGLOBIN: 10.8 g/dL — AB (ref 13.0–18.0)
MCH: 32.6 pg (ref 26.0–34.0)
MCHC: 34.4 g/dL (ref 32.0–36.0)
MCV: 94.5 fL (ref 80.0–100.0)
Platelets: 227 10*3/uL (ref 150–440)
RBC: 3.33 MIL/uL — ABNORMAL LOW (ref 4.40–5.90)
RDW: 19.9 % — AB (ref 11.5–14.5)
WBC: 4.8 10*3/uL (ref 3.8–10.6)

## 2017-05-15 LAB — BASIC METABOLIC PANEL
ANION GAP: 10 (ref 5–15)
BUN: 20 mg/dL (ref 6–20)
CALCIUM: 8.9 mg/dL (ref 8.9–10.3)
CO2: 25 mmol/L (ref 22–32)
Chloride: 99 mmol/L — ABNORMAL LOW (ref 101–111)
Creatinine, Ser: 1.47 mg/dL — ABNORMAL HIGH (ref 0.61–1.24)
GFR calc Af Amer: 54 mL/min — ABNORMAL LOW (ref >60)
GFR calc non Af Amer: 47 mL/min — ABNORMAL LOW (ref >60)
GLUCOSE: 123 mg/dL — AB (ref 65–99)
Potassium: 4.3 mmol/L (ref 3.5–5.1)
Sodium: 134 mmol/L — ABNORMAL LOW (ref 135–145)

## 2017-05-15 LAB — URINALYSIS, COMPLETE (UACMP) WITH MICROSCOPIC
Bacteria, UA: NONE SEEN
Bilirubin Urine: NEGATIVE
Glucose, UA: NEGATIVE mg/dL
Hgb urine dipstick: NEGATIVE
Ketones, ur: NEGATIVE mg/dL
Leukocytes, UA: NEGATIVE
Nitrite: NEGATIVE
PROTEIN: NEGATIVE mg/dL
RBC / HPF: NONE SEEN RBC/hpf (ref 0–5)
Specific Gravity, Urine: 1.013 (ref 1.005–1.030)
WBC UA: NONE SEEN WBC/hpf (ref 0–5)
pH: 6 (ref 5.0–8.0)

## 2017-05-15 LAB — TROPONIN I

## 2017-05-15 MED ORDER — SODIUM CHLORIDE 0.9 % IV BOLUS (SEPSIS)
1000.0000 mL | Freq: Once | INTRAVENOUS | Status: AC
  Administered 2017-05-15: 1000 mL via INTRAVENOUS

## 2017-05-15 NOTE — ED Triage Notes (Signed)
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 Once getting home patient was having a BM and became dizzy, clammy, sweaty and weak. Wife called radiation clinic and they told patient to come to ER. Patient A&O x4 in triage. Ambulatory in triage with no problems but c/o feeling weak.

## 2017-05-15 NOTE — ED Notes (Signed)
Resumed care from [place] rn.  Pt alert. md at bedside.  nsr on monitor.  Skin warm and dry.   Family with pt.

## 2017-05-15 NOTE — Discharge Instructions (Signed)
You have been seen today in the Emergency Department (ED)  for syncope (passing out).  Your workup including labs and EKG did not show a cause for your syncope and were overall reassuring.    Your symptoms may be due to dehydration, so it is important that you drink plenty of non-alcoholic fluids. Emotional stress, pain, or overheating--especially if you have been standing--can make you faint. In these cases, fainting is usually not serious. But fainting can be a sign of a more serious problem. Therefore it is imperative that you follow up with your doctor in 1-2 days for further evaluation.  When should you call for help?  Call 911 anytime you think you may need emergency care. For example, call if:  You have symptoms of a heart problem. These may include:  Chest pain or pressure.  Severe trouble breathing.  A fast or irregular heartbeat.  Lightheadedness or sudden weakness.  Coughing up pink, foamy mucus.  Passing out. You have symptoms of a stroke. These may include:  Sudden numbness, tingling, weakness, or loss of movement in your face, arm, or leg, especially on only one side of your body.  Sudden vision changes.  Sudden trouble speaking.  Sudden confusion or trouble understanding simple statements.  Sudden problems with walking or balance.  A sudden, severe headache that is different from past headaches. You passed out (lost consciousness) again.  Watch closely for changes in your health, and be sure to contact your doctor if:  You do not get better as expected.   How can you care for yourself at home?  Drink plenty of fluids to prevent dehydration. If you have kidney, heart, or liver disease and have to limit fluids, talk with your doctor before you increase your fluid intake.

## 2017-05-15 NOTE — ED Notes (Signed)
ED Provider at bedside. 

## 2017-05-15 NOTE — ED Provider Notes (Signed)
[facility] Emergency Department Provider Note  ____________________________________________  Time seen: Approximately 4:12 PM  I have reviewed the triage vital signs and the nursing notes.   HISTORY  Chief Complaint Weakness   HPI Marc Gray is a 71 y.o. 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 Patient had radiation therapy today and went home. He reports that he had some cramping in his abdomen and felt had to have a bowel movement. While sitting in the toilet and having a bowel movement he became lightheaded, clammy, and felt like he was going to pass out. He denies losing consciousness. He denies ever having similar episodes. He endorses eating normally today. He denies any abdominal pain, chest pain, palpitations, headache, facial droop, slurred speech, unilateral weakness or numbness, fever, chills, nausea, vomiting, diarrhea, melena, URI symptoms. Patient reports that he feels back to an. He called his oncologist recommended that he come to the emergency room for evaluation.  Past Medical History:  Diagnosis Date  . Anxiety   . Arthritis    SHOULDER  . Cataract   . Colon cancer ([place]) 2015   Pt states during his colonoscopy he had cancerous polyps removed.   . Depression    SINCE DIAGNOSIS  . Dyspnea    DOE  . Enlarged prostate   . Pain    DUE TO LUNG ISSUE    Patient Active Problem List   Diagnosis Date Noted  . Adenocarcinoma of left lung, stage 3 ([place]) 02/14/2017    Past Surgical History:  Procedure Laterality Date  . CATARACT EXTRACTION W/ INTRAOCULAR LENS  IMPLANT, BILATERAL    . COLONOSCOPY    . EYE SURGERY    . LYMPH GLAND EXCISION N/A 02/09/2017   Procedure: CERVICAL LYMPH NODE BIOPSY;  Surgeon: Robert Bellow, MD;  Location: [facility];  Service: General;  Laterality: N/A;  . PORTACATH PLACEMENT Right 02/23/2017   Procedure:  INSERTION PORT-A-CATH;  Surgeon: Robert Bellow, MD;  Location: [facility];  Service: General;  Laterality: Right;  . PROSTATE SURGERY    . TONSILLECTOMY      Prior to Admission medications   Medication Sig Start Date End Date Taking? Authorizing Provider  aspirin 325 MG tablet Take 650 mg by mouth daily.    Yes [provider]  Chlorpheniramine-Phenylephrine (SINUS & ALLERGY) 4-10 MG tablet Take 1 tablet by mouth daily as needed.    Yes [provider]  Multiple Vitamin (MULTI-VITAMINS) TABS Take 1 tablet by mouth daily.    Yes [provider]  Nicotine 21-14-7 MG/24HR KIT Place 1 patch daily onto the skin. 04/03/17  Yes [name], MD  sucralfate (CARAFATE) 1 g tablet Take 1 tablet (1 g total) by mouth 3 (three) times daily. Dissolve in 2-3 tbsp warm water, swish and swallow 03/27/17  Yes Chrystal, Glenn, MD  lidocaine-prilocaine (EMLA) cream Apply over port 1-2 hours prior to chemotherapy treatment.  Cover with plastic wrap. Patient not taking: Reported on 05/15/2017 02/27/17   [name], MD  nystatin (MYCOSTATIN) 100000 UNIT/ML suspension Take 5 mLs (500,000 Units total) 4 (four) times daily by mouth. Swish and swallow Patient not taking: Reported on 05/15/2017 04/10/17   [name], MD  ondansetron (ZOFRAN) 8 MG tablet Take 1 tablet (8 mg total) by mouth every 8 (eight) hours as needed for nausea or vomiting. 03/13/17   [name], MD  prochlorperazine (COMPAZINE) 10 MG tablet Take  1 tablet (10 mg total) by mouth every 6 (six) hours as needed for nausea or vomiting. 03/13/17   [name], MD    Allergies Patient has no known allergies.  Family History  Problem Relation Age of Onset  . Breast cancer Maternal Grandmother   . Dementia Mother   . Diabetes Father   . Stroke Father     Social History Social History   Tobacco Use  . Smoking status: Former Smoker    Packs/day: 1.00    Years: 53.00    Pack years: 53.00    Types: Cigarettes    Last attempt to  quit: 04/14/2017    Years since quitting: 0.0  . Smokeless tobacco: Never Used  . Tobacco comment: Quit November 2018  Substance Use Topics  . Alcohol use: Yes    Comment: occassional  . Drug use: No    Review of Systems  Constitutional: Negative for fever. + near syncope Eyes: Negative for visual changes. ENT: Negative for sore throat. Neck: No neck pain  Cardiovascular: Negative for chest pain. Respiratory: Negative for shortness of breath. Gastrointestinal: Negative for abdominal pain, vomiting or diarrhea. Genitourinary: Negative for dysuria. Musculoskeletal: Negative for back pain. Skin: Negative for rash. Neurological: Negative for headaches, weakness or numbness. Psych: No SI or HI  ____________________________________________   PHYSICAL EXAM:  VITAL SIGNS: ED Triage Vitals  Enc Vitals Group     BP 05/15/17 1302 94/61     Pulse Rate 05/15/17 1302 71     Resp 05/15/17 1302 16     Temp 05/15/17 1302 97.6 F (36.4 C)     Temp Source 05/15/17 1302 Oral     SpO2 05/15/17 1302 97 %     Weight 05/15/17 1303 176 lb (79.8 kg)     Height 05/15/17 1303 6' 1"  (1.854 m)     Head Circumference --      Peak Flow --      Pain Score --      Pain Loc --      Pain Edu? --      Excl. in [place]? --     Constitutional: Alert and oriented. Well appearing and in no apparent distress. HEENT:      Head: Normocephalic and atraumatic.         Eyes: Conjunctivae are normal. Sclera is non-icteric.       Mouth/Throat: Mucous membranes are moist.       Neck: Supple with no signs of meningismus. Cardiovascular: Regular rate and rhythm. No murmurs, gallops, or rubs. 2+ symmetrical distal pulses are present in all extremities. No JVD. Respiratory: Normal respiratory effort. Lungs are clear to auscultation bilaterally. No wheezes, crackles, or rhonchi.  Gastrointestinal: Soft, non tender, and non distended with positive bowel sounds. No rebound or guarding. Rectal exam showing brown stool  guaiac negative Musculoskeletal: Nontender with normal range of motion in all extremities. No edema, cyanosis, or erythema of extremities. Neurologic: Normal speech and language. A & O x3, PERRL, EOMI, no nystagmus, CN II-XII intact, motor testing reveals good tone and bulk throughout. There is no evidence of pronator drift or dysmetria. Muscle strength is 5/5 throughout.  Sensory examination is intact. Gait deferred Skin: Skin is warm, dry and intact. No rash noted. Psychiatric: Mood and affect are normal. Speech and behavior are normal.  ____________________________________________   LABS (all labs ordered are listed, but only abnormal results are displayed)  Labs Reviewed  BASIC METABOLIC PANEL - Abnormal; Notable for the following components:  Result Value   Sodium 134 (*)    Chloride 99 (*)    Glucose, Bld 123 (*)    Creatinine, Ser 1.47 (*)    GFR calc non Af Amer 47 (*)    GFR calc Af Amer 54 (*)    All other components within normal limits  CBC - Abnormal; Notable for the following components:   RBC 3.33 (*)    Hemoglobin 10.8 (*)    HCT 31.4 (*)    RDW 19.9 (*)    All other components within normal limits  URINALYSIS, COMPLETE (UACMP) WITH MICROSCOPIC - Abnormal; Notable for the following components:   Color, Urine YELLOW (*)    APPearance CLEAR (*)    Squamous Epithelial / LPF 0-5 (*)    All other components within normal limits  TROPONIN I   ____________________________________________  EKG  ED ECG REPORT I, Rudene Re, the attending physician, personally viewed and interpreted this ECG.  Normal sinus rhythm, rate of 69, normal intervals, normal axis, no ST elevations or depressions. Unchanged from prior from September 2008 ____________________________________________  RADIOLOGY  none  ____________________________________________   PROCEDURES  Procedure(s) performed: None Procedures Critical Care performed:   None ____________________________________________   INITIAL IMPRESSION / ASSESSMENT AND PLAN / ED COURSE  71 y.o. male with h/o stage Gray B lung canceron radiation chemotherapy who presents for evaluation of a near syncopal episode while in the toilet having a BM. Patient is well appearing upon arrival to the emergency room slightly hypotensive with BP of 94/61, remaining of his vital signs are within normal limits. Patient has no headache, is not on blood thinners, and has no neuro deficits. His orthostatic vital signs are positive concerning for component of dehydration. The fact the patient was also in the toilet mixed me concern for vasovagal episode. Blood work is showing no evidence of UTI, slightly elevated creatinine at 1.47 (baseline is 1.0-1.2), hgb 10.8 (12.1 one month ago). NO active signs of bleeding. Will give IVF and repeat orthostatic. Patient feels at baseline and wants to go home if possible. Since he is not actively bleeding, if vitals normalize, will discuss with oncologist for possible dc home and close f/u  Clinical Course as of May 15 1712  Tue May 15, 2017  1710 Vitals markedly improved after IVF, patient remains at baseline and has no further episodes of dizziness. Discussed lab findings, vitals, presentation with Dr. Talbert Cage, patient's oncologist who agrees with d/c home since patient feels improved and she will see him in the office this week for close follow up. Discuss increase oral hydration with patient and recommended he return to the emergency room if he has any further episodes of dizziness or any other symptoms such as chest pain, abdominal pain, nausea vomiting, fever. She is comfortable with this plan and wishes to go home.  [CV]    Clinical Course User Index [CV] Alfred Levins [place], MD     As part of my medical decision making, I reviewed the following data within the [place] notes reviewed and incorporated, Labs reviewed , EKG  interpreted , Old EKG reviewed, Old chart reviewed, A consult was requested and obtained from this/these consultant(s) Oncology, Notes from prior ED visits and [place] Controlled Substance Database    Pertinent labs & imaging results that were available during my care of the patient were reviewed by me and considered in my medical decision making (see chart for details).    ____________________________________________   FINAL CLINICAL IMPRESSION(S) /  ED DIAGNOSES  Final diagnoses:  Orthostatic hypotension  Near syncope  Vasovagal near syncope  Dehydration  Anemia, unspecified type      NEW MEDICATIONS STARTED DURING THIS VISIT:  ED Discharge Orders    None       Note:  This document was prepared using Dragon voice recognition software and may include unintentional dictation errors.    Rudene Re, MD 05/15/17 [phone]

## 2017-05-16 ENCOUNTER — Ambulatory Visit: Payer: PPO | Admitting: Oncology

## 2017-05-16 ENCOUNTER — Ambulatory Visit
Admission: RE | Admit: 2017-05-16 | Discharge: 2017-05-16 | Disposition: A | Discharge status: Home / Self Care | Payer: PPO | Source: Ambulatory Visit | Attending: Radiation Oncology | Admitting: Radiation Oncology

## 2017-05-16 DIAGNOSIS — C3412 Malignant neoplasm of upper lobe, left bronchus or lung: Secondary | ICD-10-CM | POA: Diagnosis not present

## 2017-05-16 DIAGNOSIS — F17210 Nicotine dependence, cigarettes, uncomplicated: Secondary | ICD-10-CM | POA: Diagnosis not present

## 2017-05-16 DIAGNOSIS — C778 Secondary and unspecified malignant neoplasm of lymph nodes of multiple regions: Secondary | ICD-10-CM | POA: Diagnosis not present

## 2017-05-16 DIAGNOSIS — C3492 Malignant neoplasm of unspecified part of left bronchus or lung: Secondary | ICD-10-CM | POA: Diagnosis not present

## 2017-05-17 ENCOUNTER — Ambulatory Visit
Admission: RE | Admit: 2017-05-17 | Discharge: 2017-05-17 | Disposition: A | Discharge status: Home / Self Care | Payer: PPO | Source: Ambulatory Visit | Attending: Radiation Oncology | Admitting: Radiation Oncology

## 2017-05-17 DIAGNOSIS — C778 Secondary and unspecified malignant neoplasm of lymph nodes of multiple regions: Secondary | ICD-10-CM | POA: Diagnosis not present

## 2017-05-17 DIAGNOSIS — F17210 Nicotine dependence, cigarettes, uncomplicated: Secondary | ICD-10-CM | POA: Diagnosis not present

## 2017-05-17 DIAGNOSIS — C3492 Malignant neoplasm of unspecified part of left bronchus or lung: Secondary | ICD-10-CM | POA: Diagnosis not present

## 2017-05-17 DIAGNOSIS — C3412 Malignant neoplasm of upper lobe, left bronchus or lung: Secondary | ICD-10-CM | POA: Diagnosis not present

## 2017-05-18 ENCOUNTER — Inpatient Hospital Stay: Payer: PPO

## 2017-05-18 ENCOUNTER — Ambulatory Visit: Payer: PPO

## 2017-05-18 ENCOUNTER — Other Ambulatory Visit: Payer: Self-pay

## 2017-05-18 ENCOUNTER — Ambulatory Visit
Admission: RE | Admit: 2017-05-18 | Discharge: 2017-05-18 | Disposition: A | Discharge status: Home / Self Care | Payer: PPO | Source: Ambulatory Visit | Attending: Radiation Oncology | Admitting: Radiation Oncology

## 2017-05-18 ENCOUNTER — Encounter: Payer: Self-pay | Admitting: Oncology

## 2017-05-18 ENCOUNTER — Inpatient Hospital Stay: Payer: PPO | Attending: Oncology | Admitting: Oncology

## 2017-05-18 VITALS — BP 130/76 | HR 65 | Temp 96.2°F | Resp 18 | Wt 175.5 lb

## 2017-05-18 DIAGNOSIS — Z7982 Long term (current) use of aspirin: Secondary | ICD-10-CM | POA: Insufficient documentation

## 2017-05-18 DIAGNOSIS — Z87891 Personal history of nicotine dependence: Secondary | ICD-10-CM | POA: Insufficient documentation

## 2017-05-18 DIAGNOSIS — N400 Enlarged prostate without lower urinary tract symptoms: Secondary | ICD-10-CM | POA: Insufficient documentation

## 2017-05-18 DIAGNOSIS — E860 Dehydration: Secondary | ICD-10-CM

## 2017-05-18 DIAGNOSIS — R1319 Other dysphagia: Secondary | ICD-10-CM | POA: Diagnosis not present

## 2017-05-18 DIAGNOSIS — C3412 Malignant neoplasm of upper lobe, left bronchus or lung: Secondary | ICD-10-CM | POA: Diagnosis not present

## 2017-05-18 DIAGNOSIS — Z803 Family history of malignant neoplasm of breast: Secondary | ICD-10-CM | POA: Diagnosis not present

## 2017-05-18 DIAGNOSIS — C3492 Malignant neoplasm of unspecified part of left bronchus or lung: Secondary | ICD-10-CM | POA: Diagnosis not present

## 2017-05-18 DIAGNOSIS — Z9221 Personal history of antineoplastic chemotherapy: Secondary | ICD-10-CM | POA: Insufficient documentation

## 2017-05-18 DIAGNOSIS — C770 Secondary and unspecified malignant neoplasm of lymph nodes of head, face and neck: Secondary | ICD-10-CM | POA: Diagnosis not present

## 2017-05-18 DIAGNOSIS — Z86010 Personal history of colonic polyps: Secondary | ICD-10-CM | POA: Diagnosis not present

## 2017-05-18 DIAGNOSIS — Y842 Radiological procedure and radiotherapy as the cause of abnormal reaction of the patient, or of later complication, without mention of misadventure at the time of the procedure: Secondary | ICD-10-CM | POA: Insufficient documentation

## 2017-05-18 DIAGNOSIS — F419 Anxiety disorder, unspecified: Secondary | ICD-10-CM | POA: Diagnosis not present

## 2017-05-18 DIAGNOSIS — F17210 Nicotine dependence, cigarettes, uncomplicated: Secondary | ICD-10-CM | POA: Diagnosis not present

## 2017-05-18 DIAGNOSIS — Z79899 Other long term (current) drug therapy: Secondary | ICD-10-CM | POA: Diagnosis not present

## 2017-05-18 DIAGNOSIS — Z09 Encounter for follow-up examination after completed treatment for conditions other than malignant neoplasm: Secondary | ICD-10-CM

## 2017-05-18 DIAGNOSIS — C778 Secondary and unspecified malignant neoplasm of lymph nodes of multiple regions: Secondary | ICD-10-CM | POA: Diagnosis not present

## 2017-05-18 DIAGNOSIS — M1990 Unspecified osteoarthritis, unspecified site: Secondary | ICD-10-CM | POA: Insufficient documentation

## 2017-05-18 LAB — TSH: TSH: 4.364 u[IU]/mL (ref 0.350–4.500)

## 2017-05-18 LAB — BASIC METABOLIC PANEL
ANION GAP: 8 (ref 5–15)
BUN: 17 mg/dL (ref 6–20)
CALCIUM: 8.5 mg/dL — AB (ref 8.9–10.3)
CO2: 23 mmol/L (ref 22–32)
CREATININE: 1.18 mg/dL (ref 0.61–1.24)
Chloride: 99 mmol/L — ABNORMAL LOW (ref 101–111)
GFR calc Af Amer: >60 mL/min (ref >60)
GFR calc non Af Amer: >60 mL/min (ref >60)
GLUCOSE: 99 mg/dL (ref 65–99)
Potassium: 3.6 mmol/L (ref 3.5–5.1)
Sodium: 130 mmol/L — ABNORMAL LOW (ref 135–145)

## 2017-05-18 MED ORDER — MORPHINE SULFATE 20 MG/5ML PO SOLN: 10.0000 mg | Freq: Four times a day (QID) | ORAL | 0 refills | Status: DC | PRN

## 2017-05-18 MED ORDER — PANTOPRAZOLE SODIUM 40 MG PO TBEC: 40.0000 mg | DELAYED_RELEASE_TABLET | Freq: Every day | ORAL | 0 refills | Status: DC

## 2017-05-18 NOTE — Progress Notes (Signed)
On dec 18 th went to ER-dehydrated per pt-felt better after fluids .

## 2017-05-18 NOTE — Progress Notes (Signed)
Hematology/Oncology Follow Up Note [facility], [place] Telephone:([phone] [phone]   Patient Care Team: [name], MD as PCP - General (Family Medicine) [name], [name], MD (General Surgery) [name], MD as Consulting Physician (Oncology) [name], RN as Registered Nurse  Reason for Visit: follow up for treatment of lung cancer  HISTORY OF PRESENTING ILLNESS:  Marc Gray 71 y.o.  male who presents for treatment of lung cancer Patient felt a neck mass for about 7-8 months and lately also atypical chest pain and he went to see primary care physician [place]. CT neck was done which showed multiple cervical/supraclavical lymphadenopathy as well as left upper lobe mass. 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   Colon CA was listed in his history, however reviewing his surgical pathology from colonoscopy on 04/15/2014, during which he had polyps removed and all are negative for cancer.patient is scheduled for colonoscopy screening this year. # Patient is s/p [place] and taxol concurrent RT for treatment of Stage IIIB Lung cancer. He is receiving additional lung boost RT.   INTERVAL HISTORY Patient presents to ED this week for evaluation of near syncope episode. His vital sign was positive for orthostatic and received fluid hydration in ER. He was later released after hydration and feels better. Today he presents for follow-up after ER visit. He reports feeling better after the hydration. He is eating but not too much due to swallowing pain. He takes Carafate. He also takes his wife's pantoprazole. . Today he denies dizziness. His vital signs are stable in the clinic.  Review of Systems - Oncology  Constitutional: Negative for fever, night sweats,change in appetite. unintentional weight loss,  HENT: Negative for ear pain, hearing loss, nasal bleeding Eyes: Negative for eye pain, double vision   Respiratory: Negative  for wheezing, shortness of breath, + cough Cardiovascular: Negative for chest pain, palpitation.   Gastrointestinal: Negative abdominal pain, diarrhea, nausea vomiting. Pain with swallowing. Endocrine: Negative  Genitourinary: Negative for dysuria, hematuria, frequency Skin: Negative for rash, iching, bruising Neurological: Negative for headache, dizziness, seizure Hematological: Negative for easy bruising/bleeding, lymph node enlargement Psychiatric/Behavioral: Negative for depression, anxiety, suicidality MEDICAL HISTORY:  Past Medical History:  Diagnosis Date  . Anxiety   . Arthritis    SHOULDER  . Cataract   . Colon cancer ([place]) 2015   Pt states during his colonoscopy he had cancerous polyps removed.   . Depression    SINCE DIAGNOSIS  . Dyspnea    DOE  . Enlarged prostate   . Pain    DUE TO LUNG ISSUE    SURGICAL HISTORY: Past Surgical History:  Procedure Laterality Date  . CATARACT EXTRACTION W/ INTRAOCULAR LENS  IMPLANT, BILATERAL    . COLONOSCOPY    . EYE SURGERY    . LYMPH GLAND EXCISION N/A 02/09/2017   Procedure: CERVICAL LYMPH NODE BIOPSY;  Surgeon: Robert Bellow, MD;  Location: [facility];  Service: General;  Laterality: N/A;  . PORTACATH PLACEMENT Right 02/23/2017   Procedure: INSERTION PORT-A-CATH;  Surgeon: Robert Bellow, MD;  Location: [facility];  Service: General;  Laterality: Right;  . PROSTATE SURGERY    . TONSILLECTOMY      SOCIAL HISTORY: Social History   Socioeconomic History  . Marital status: Married    Spouse name: Not on file  . Number of children: Not on file  . Years of education: Not on file  . Highest education level: Not on file  Social Needs  .  Financial resource strain: Not on file  . Food insecurity - worry: Not on file  . Food insecurity - inability: Not on file  . Transportation needs - medical: Not on file  . Transportation needs - non-medical: Not on file  Occupational History  . Not on file  Tobacco Use  . Smoking  status: Former Smoker    Packs/day: 1.00    Years: 53.00    Pack years: 53.00    Types: Cigarettes    Last attempt to quit: 04/14/2017    Years since quitting: 0.0  . Smokeless tobacco: Never Used  . Tobacco comment: Quit November 2018  Substance and Sexual Activity  . Alcohol use: Yes    Comment: occassional  . Drug use: No  . Sexual activity: Yes  Other Topics Concern  . Not on file  Social History Narrative  . Not on file    FAMILY HISTORY: Family History  Problem Relation Age of Onset  . Breast cancer Maternal Grandmother   . Dementia Mother   . Diabetes Father   . Stroke Father     ALLERGIES:  has No Known Allergies.  MEDICATIONS:  Current Outpatient Medications  Medication Sig Dispense Refill  . aspirin 325 MG tablet Take 650 mg by mouth daily.     . Chlorpheniramine-Phenylephrine (SINUS & ALLERGY) 4-10 MG tablet Take 1 tablet by mouth daily as needed.     . lidocaine-prilocaine (EMLA) cream Apply over port 1-2 hours prior to chemotherapy treatment.  Cover with plastic wrap. (Patient not taking: Reported on 05/15/2017) 30 g 1  . Multiple Vitamin (MULTI-VITAMINS) TABS Take 1 tablet by mouth daily.     . Nicotine 21-14-7 MG/24HR KIT Place 1 patch daily onto the skin. 1 each 0  . nystatin (MYCOSTATIN) 100000 UNIT/ML suspension Take 5 mLs (500,000 Units total) 4 (four) times daily by mouth. Swish and swallow (Patient not taking: Reported on 05/15/2017) 473 mL 0  . ondansetron (ZOFRAN) 8 MG tablet Take 1 tablet (8 mg total) by mouth every 8 (eight) hours as needed for nausea or vomiting. 60 tablet 1  . prochlorperazine (COMPAZINE) 10 MG tablet Take 1 tablet (10 mg total) by mouth every 6 (six) hours as needed for nausea or vomiting. 60 tablet 2  . sucralfate (CARAFATE) 1 g tablet Take 1 tablet (1 g total) by mouth 3 (three) times daily. Dissolve in 2-3 tbsp warm water, swish and swallow 90 tablet 3   No current facility-administered medications for this visit.     Facility-Administered Medications Ordered in Other Visits  Medication Dose Route Frequency Provider Last Rate Last Dose  . sodium chloride flush (NS) 0.9 % injection 10 mL  10 mL Intravenous PRN [name], MD   10 mL at 04/03/17 4174      .  PHYSICAL EXAMINATION: ECOG PERFORMANCE STATUS: 1 - Symptomatic but completely ambulatory Vitals:   05/18/17 1128  BP: 130/76  Pulse: 65  Resp: 18  Temp: (!) 96.2 F (35.7 C)   Filed Weights   05/18/17 1128  Weight: 175 lb 8 oz (79.6 kg)  Weight has been stable. GENERAL: No distress, thin, SKIN:  No rashes or significant lesions  HEAD: Normocephalic, No masses, lesions, tenderness or abnormalities  EYES: Conjunctiva are pink, non icteric ENT: External ears normal ,lips , buccal mucosa, and tongue normal and mucous membranes are moist. No thrush. LYMPH left supraclavicular lymph nodes decreased in size. LUNGS: Decreased breath sound bilaterally lower lobe. HEART: Regular rate & rhythm, no  murmurs, no gallops, S1 normal and S2 normal  ABDOMEN: Abdomen soft, non-tender, normal bowel sounds, I did not appreciate any  masses or organomegaly  MUSCULOSKELETAL: No CVA tenderness and no tenderness on percussion of the back or rib cage.  EXTREMITIES: No edema, no skin discoloration or tenderness NEURO: Alert & oriented, no focal motor/sensory deficits.   LABORATORY DATA:  I have reviewed the data as listed Lab Results  Component Value Date   WBC 4.8 05/15/2017   HGB 10.8 (L) 05/15/2017   HCT 31.4 (L) 05/15/2017   MCV 94.5 05/15/2017   PLT 227 05/15/2017   Recent Labs    03/27/17 0909 04/03/17 0837 04/10/17 0844 05/15/17 1304 05/18/17 1232  NA 136 134* 136 134* 130*  K 4.2 4.0 4.1 4.3 3.6  CL 103 104 106 99* 99*  CO2 24 24 26 25 23   GLUCOSE 86 100* 107* 123* 99  BUN 25* 21* 29* 20 17  CREATININE 1.33* 1.23 1.06 1.47* 1.18  CALCIUM 8.9 8.8* 8.7* 8.9 8.5*  GFRNONAA 53* 58* >60 47* >60  GFRAA >60 >60 >60 54* >60  PROT 6.6 6.5  6.1*  --   --   ALBUMIN 3.6 3.5 3.3*  --   --   AST 24 22 25   --   --   ALT 28 22 27   --   --   ALKPHOS 54 50 49  --   --   BILITOT 0.8 0.7 0.8  --   --     ASSESSMENT & PLAN:  Cancer Staging Adenocarcinoma of left lung, stage 3 (HCC) Staging form: Lung, AJCC 8th Edition - Clinical stage from 02/14/2017: Stage IIIB (cT2b, cN3, cM0) - Signed by [name], MD on 02/14/2017  1. Adenocarcinoma of left lung, stage 3 (HCC)   2. Dehydration   3. Hospital discharge follow-up    # Encourage patient for oral hydration and by mouth intake. Prescription of liquid morphine 10 mg/2.60m by mouth every 6 hours as needed before meal. # Is finishing his post radiation next week, anticipating his odynophagia symptoms will persist for several weeks. I sent a prescription of pantoprazole 463mdaily. Patient used to take his wife's medication. I encourage patient to feel the medication prescription and take medication as instructed. # We'll schedule him for weekly hydration 2. Check BMP, and TSH today. Patient will come back on 06/06/2017 prior to cycle 1 durvalumab. Prior to that he'll also have a CT scan to establish a new baseline. All questions were answered. The patient knows to call the clinic with any problems questions or concerns.   ZhEarlie ServerMD, PhD Hematology Oncology [facility], [facility]- [phone]/21/2018

## 2017-05-21 ENCOUNTER — Ambulatory Visit: Payer: PPO

## 2017-05-21 ENCOUNTER — Ambulatory Visit
Admission: RE | Admit: 2017-05-21 | Discharge: 2017-05-21 | Disposition: A | Discharge status: Home / Self Care | Payer: PPO | Source: Ambulatory Visit | Attending: Radiation Oncology | Admitting: Radiation Oncology

## 2017-05-21 DIAGNOSIS — C778 Secondary and unspecified malignant neoplasm of lymph nodes of multiple regions: Secondary | ICD-10-CM | POA: Diagnosis not present

## 2017-05-21 DIAGNOSIS — C3412 Malignant neoplasm of upper lobe, left bronchus or lung: Secondary | ICD-10-CM | POA: Diagnosis not present

## 2017-05-21 DIAGNOSIS — F17210 Nicotine dependence, cigarettes, uncomplicated: Secondary | ICD-10-CM | POA: Diagnosis not present

## 2017-05-21 DIAGNOSIS — C3492 Malignant neoplasm of unspecified part of left bronchus or lung: Secondary | ICD-10-CM | POA: Diagnosis not present

## 2017-05-23 ENCOUNTER — Ambulatory Visit: Payer: PPO

## 2017-05-24 ENCOUNTER — Inpatient Hospital Stay: Payer: PPO

## 2017-05-24 ENCOUNTER — Ambulatory Visit: Payer: PPO

## 2017-05-24 VITALS — BP 136/88 | HR 71 | Temp 98.0°F | Resp 18

## 2017-05-24 DIAGNOSIS — C3412 Malignant neoplasm of upper lobe, left bronchus or lung: Secondary | ICD-10-CM | POA: Diagnosis not present

## 2017-05-24 DIAGNOSIS — C3492 Malignant neoplasm of unspecified part of left bronchus or lung: Secondary | ICD-10-CM

## 2017-05-24 DIAGNOSIS — E860 Dehydration: Secondary | ICD-10-CM

## 2017-05-24 MED ORDER — HEPARIN SOD (PORK) LOCK FLUSH 100 UNIT/ML IV SOLN
500.0000 [IU] | Freq: Once | INTRAVENOUS | Status: AC | PRN
  Administered 2017-05-24: 500 [IU]

## 2017-05-24 MED ORDER — HEPARIN SOD (PORK) LOCK FLUSH 100 UNIT/ML IV SOLN
INTRAVENOUS | Status: AC
  Filled 2017-05-24: qty 5

## 2017-05-24 MED ORDER — SODIUM CHLORIDE 0.9 % IV SOLN
Freq: Once | INTRAVENOUS | Status: AC
  Administered 2017-05-24: 09:00:00 via INTRAVENOUS
  Filled 2017-05-24: qty 1000

## 2017-05-31 ENCOUNTER — Inpatient Hospital Stay: Payer: PPO | Attending: Oncology

## 2017-05-31 VITALS — BP 118/77 | HR 83 | Temp 97.1°F | Resp 18

## 2017-05-31 DIAGNOSIS — K219 Gastro-esophageal reflux disease without esophagitis: Secondary | ICD-10-CM | POA: Insufficient documentation

## 2017-05-31 DIAGNOSIS — E860 Dehydration: Secondary | ICD-10-CM

## 2017-05-31 DIAGNOSIS — Z923 Personal history of irradiation: Secondary | ICD-10-CM | POA: Insufficient documentation

## 2017-05-31 DIAGNOSIS — Z5112 Encounter for antineoplastic immunotherapy: Secondary | ICD-10-CM | POA: Insufficient documentation

## 2017-05-31 DIAGNOSIS — C3412 Malignant neoplasm of upper lobe, left bronchus or lung: Secondary | ICD-10-CM | POA: Diagnosis not present

## 2017-05-31 DIAGNOSIS — Z79899 Other long term (current) drug therapy: Secondary | ICD-10-CM | POA: Insufficient documentation

## 2017-05-31 DIAGNOSIS — N400 Enlarged prostate without lower urinary tract symptoms: Secondary | ICD-10-CM | POA: Insufficient documentation

## 2017-05-31 DIAGNOSIS — Z803 Family history of malignant neoplasm of breast: Secondary | ICD-10-CM | POA: Insufficient documentation

## 2017-05-31 DIAGNOSIS — C770 Secondary and unspecified malignant neoplasm of lymph nodes of head, face and neck: Secondary | ICD-10-CM | POA: Insufficient documentation

## 2017-05-31 DIAGNOSIS — Z7982 Long term (current) use of aspirin: Secondary | ICD-10-CM | POA: Insufficient documentation

## 2017-05-31 DIAGNOSIS — Z9221 Personal history of antineoplastic chemotherapy: Secondary | ICD-10-CM | POA: Insufficient documentation

## 2017-05-31 DIAGNOSIS — F418 Other specified anxiety disorders: Secondary | ICD-10-CM | POA: Diagnosis not present

## 2017-05-31 DIAGNOSIS — R05 Cough: Secondary | ICD-10-CM | POA: Diagnosis not present

## 2017-05-31 DIAGNOSIS — M1990 Unspecified osteoarthritis, unspecified site: Secondary | ICD-10-CM | POA: Diagnosis not present

## 2017-05-31 DIAGNOSIS — I10 Essential (primary) hypertension: Secondary | ICD-10-CM | POA: Insufficient documentation

## 2017-05-31 DIAGNOSIS — Z87891 Personal history of nicotine dependence: Secondary | ICD-10-CM | POA: Diagnosis not present

## 2017-05-31 DIAGNOSIS — Z86010 Personal history of colonic polyps: Secondary | ICD-10-CM | POA: Diagnosis not present

## 2017-05-31 DIAGNOSIS — N179 Acute kidney failure, unspecified: Secondary | ICD-10-CM | POA: Diagnosis not present

## 2017-05-31 DIAGNOSIS — C3492 Malignant neoplasm of unspecified part of left bronchus or lung: Secondary | ICD-10-CM

## 2017-05-31 MED ORDER — SODIUM CHLORIDE 0.9 % IV SOLN
Freq: Once | INTRAVENOUS | Status: AC
  Administered 2017-05-31: 09:00:00 via INTRAVENOUS
  Filled 2017-05-31: qty 1000

## 2017-05-31 MED ORDER — SODIUM CHLORIDE 0.9% FLUSH
10.0000 mL | INTRAVENOUS | Status: DC | PRN
  Administered 2017-05-31: 10 mL
  Filled 2017-05-31: qty 10

## 2017-05-31 MED ORDER — HEPARIN SOD (PORK) LOCK FLUSH 100 UNIT/ML IV SOLN
500.0000 [IU] | Freq: Once | INTRAVENOUS | Status: AC | PRN
  Administered 2017-05-31: 500 [IU]
  Filled 2017-05-31: qty 5

## 2017-06-04 ENCOUNTER — Ambulatory Visit
Admission: RE | Admit: 2017-06-04 | Discharge: 2017-06-04 | Disposition: A | Discharge status: Home / Self Care | Payer: PPO | Source: Ambulatory Visit | Attending: Oncology | Admitting: Oncology

## 2017-06-04 DIAGNOSIS — C3490 Malignant neoplasm of unspecified part of unspecified bronchus or lung: Secondary | ICD-10-CM | POA: Insufficient documentation

## 2017-06-04 DIAGNOSIS — I700 Atherosclerosis of aorta: Secondary | ICD-10-CM | POA: Insufficient documentation

## 2017-06-04 DIAGNOSIS — J432 Centrilobular emphysema: Secondary | ICD-10-CM | POA: Insufficient documentation

## 2017-06-04 DIAGNOSIS — I2510 Atherosclerotic heart disease of native coronary artery without angina pectoris: Secondary | ICD-10-CM | POA: Diagnosis not present

## 2017-06-04 DIAGNOSIS — J439 Emphysema, unspecified: Secondary | ICD-10-CM | POA: Diagnosis not present

## 2017-06-04 DIAGNOSIS — R590 Localized enlarged lymph nodes: Secondary | ICD-10-CM | POA: Diagnosis not present

## 2017-06-04 DIAGNOSIS — J438 Other emphysema: Secondary | ICD-10-CM | POA: Diagnosis not present

## 2017-06-04 HISTORY — DX: Essential (primary) hypertension: I10

## 2017-06-04 HISTORY — DX: Malignant neoplasm of unspecified part of unspecified bronchus or lung: C34.90

## 2017-06-04 MED ORDER — IOPAMIDOL (ISOVUE-300) INJECTION 61%
75.0000 mL | Freq: Once | INTRAVENOUS | Status: AC | PRN
  Administered 2017-06-04: 75 mL via INTRAVENOUS

## 2017-06-05 NOTE — Progress Notes (Signed)
Hematology/Oncology Follow Up Note [facility] [phone] Fax:([phone]   Patient Care Team: [name], MD as PCP - General (Family Medicine) [name], [name], MD (General Surgery) [name], MD as Consulting Physician (Oncology) [name], RN as Registered Nurse  Reason for Visit: follow up for treatment of lung cancer  HISTORY OF PRESENTING ILLNESS:  Marc Gray 72 y.o.  male who presents for treatment of lung cancer Patient felt a neck mass for about 7-8 months and lately also atypical chest pain and he went to see primary care physician [place]. CT neck was done which showed multiple cervical/supraclavical lymphadenopathy as well as left upper lobe mass. 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   Colon CA was listed in his history, however reviewing his surgical pathology from colonoscopy on 04/15/2014, during which he had polyps removed and all are negative for cancer.patient is scheduled for colonoscopy screening this year. # Patient is s/p [place] and taxol concurrent RT for treatment of Stage IIIB Lung cancer. He is receiving additional lung boost RT.   INTERVAL HISTORY Patient he presents for evaluation prior to immunotherapy. Patient reports feeling well, denies any more syncope episodes. Reports that he is eating and drinking very well. He has chronic cough. Denies any shortness of breath, abdominal pain or leg swelling.  Review of Systems  Constitutional: Negative for appetite change, chills, fatigue and unexpected weight change.  HENT:   Negative for hearing loss.   Eyes: Negative for eye problems.  Respiratory: Negative for chest tightness.   Cardiovascular: Negative for chest pain.  Gastrointestinal: Negative for abdominal distention and abdominal pain.  Endocrine: Negative for hot flashes.  Genitourinary: Negative for difficulty urinating.   Musculoskeletal: Negative for  arthralgias.  Skin: Negative for itching.  Neurological: Negative for dizziness and headaches.  Hematological: Negative for adenopathy.  Psychiatric/Behavioral: The patient is not nervous/anxious.     MEDICAL HISTORY:  Past Medical History:  Diagnosis Date  . Anxiety   . Arthritis    SHOULDER  . Cataract   . Colon cancer ([place]) 2015   Pt states during his colonoscopy he had cancerous polyps removed.   . Depression    SINCE DIAGNOSIS  . Dyspnea    DOE  . Enlarged prostate   . Hypertension   . Lung cancer [facility])    Aug 2018  . Pain    DUE TO LUNG ISSUE    SURGICAL HISTORY: Past Surgical History:  Procedure Laterality Date  . CATARACT EXTRACTION W/ INTRAOCULAR LENS  IMPLANT, BILATERAL    . COLONOSCOPY    . EYE SURGERY    . LYMPH GLAND EXCISION N/A 02/09/2017   Procedure: CERVICAL LYMPH NODE BIOPSY;  Surgeon: Robert Bellow, MD;  Location: [facility];  Service: General;  Laterality: N/A;  . PORTACATH PLACEMENT Right 02/23/2017   Procedure: INSERTION PORT-A-CATH;  Surgeon: Robert Bellow, MD;  Location: [facility];  Service: General;  Laterality: Right;  . PROSTATE SURGERY    . TONSILLECTOMY      SOCIAL HISTORY: Social History   Socioeconomic History  . Marital status: Married    Spouse name: Not on file  . Number of children: Not on file  . Years of education: Not on file  . Highest education level: Not on file  Social Needs  . Financial resource strain: Not on file  . Food insecurity - worry: Not on file  . Food insecurity - inability: Not on file  . Transportation needs -  medical: Not on file  . Transportation needs - non-medical: Not on file  Occupational History  . Not on file  Tobacco Use  . Smoking status: Former Smoker    Packs/day: 1.00    Years: 53.00    Pack years: 53.00    Types: Cigarettes    Last attempt to quit: 04/14/2017    Years since quitting: 0.1  . Smokeless tobacco: Never Used  . Tobacco comment: Quit November 2018  Substance and  Sexual Activity  . Alcohol use: Yes    Comment: occassional  . Drug use: No  . Sexual activity: Yes  Other Topics Concern  . Not on file  Social History Narrative  . Not on file    FAMILY HISTORY: Family History  Problem Relation Age of Onset  . Breast cancer Maternal Grandmother   . Dementia Mother   . Diabetes Father   . Stroke Father     ALLERGIES:  has No Known Allergies.  MEDICATIONS:  Current Outpatient Medications  Medication Sig Dispense Refill  . aspirin 325 MG tablet Take 650 mg by mouth daily.     . Chlorpheniramine-Phenylephrine (SINUS & ALLERGY) 4-10 MG tablet Take 1 tablet by mouth daily as needed.     . lidocaine-prilocaine (EMLA) cream Apply over port 1-2 hours prior to chemotherapy treatment.  Cover with plastic wrap. (Patient not taking: Reported on 05/15/2017) 30 g 1  . morphine 20 MG/5ML solution Take 2.5 mLs (10 mg total) by mouth every 6 (six) hours as needed for pain (before meal). 100 mL 0  . Multiple Vitamin (MULTI-VITAMINS) TABS Take 1 tablet by mouth daily.     . Nicotine 21-14-7 MG/24HR KIT Place 1 patch daily onto the skin. 1 each 0  . nystatin (MYCOSTATIN) 100000 UNIT/ML suspension Take 5 mLs (500,000 Units total) 4 (four) times daily by mouth. Swish and swallow (Patient not taking: Reported on 05/15/2017) 473 mL 0  . ondansetron (ZOFRAN) 8 MG tablet Take 1 tablet (8 mg total) by mouth every 8 (eight) hours as needed for nausea or vomiting. (Patient not taking: Reported on 05/18/2017) 60 tablet 1  . pantoprazole (PROTONIX) 40 MG tablet Take 1 tablet (40 mg total) by mouth daily. 30 tablet 0  . prochlorperazine (COMPAZINE) 10 MG tablet Take 1 tablet (10 mg total) by mouth every 6 (six) hours as needed for nausea or vomiting. (Patient not taking: Reported on 05/18/2017) 60 tablet 2  . sucralfate (CARAFATE) 1 g tablet Take 1 tablet (1 g total) by mouth 3 (three) times daily. Dissolve in 2-3 tbsp warm water, swish and swallow 90 tablet 3   No current  facility-administered medications for this visit.    Facility-Administered Medications Ordered in Other Visits  Medication Dose Route Frequency Provider Last Rate Last Dose  . sodium chloride flush (NS) 0.9 % injection 10 mL  10 mL Intravenous PRN [name], MD   10 mL at 04/03/17 1610      .  PHYSICAL EXAMINATION: ECOG PERFORMANCE STATUS: 1 - Symptomatic but completely ambulatory Vitals:   06/06/17 1041  BP: 130/79  Pulse: 67  Resp: 18  Temp: 97.6 F (36.4 C)   Filed Weights   06/06/17 1041  Weight: 179 lb (81.2 kg)   GENERAL: No distress, thin SKIN:  No rashes or significant lesions  HEAD: Normocephalic, No masses, lesions, tenderness or abnormalities  EYES: Conjunctiva are pink, non icteric ENT: External ears normal ,lips , buccal mucosa, and tongue normal and mucous membranes are moist  LYMPH: left supraclavicular lymph node no longer palpable. LUNGS: Clear to auscultation, no crackles or wheezes HEART: Regular rate & rhythm, no murmurs, no gallops, S1 normal and S2 normal  ABDOMEN: Abdomen soft, non-tender, normal bowel sounds, I did not appreciate any  masses or organomegaly  MUSCULOSKELETAL: No CVA tenderness and no tenderness on percussion of the back or rib cage.  EXTREMITIES: No edema, no skin discoloration or tenderness NEURO: Alert & oriented, no focal motor/sensory deficits.  LABORATORY DATA:  I have reviewed the data as listed Lab Results  Component Value Date   WBC 4.8 05/15/2017   HGB 10.8 (L) 05/15/2017   HCT 31.4 (L) 05/15/2017   MCV 94.5 05/15/2017   PLT 227 05/15/2017   Recent Labs    03/27/17 0909 04/03/17 0837 04/10/17 0844 05/15/17 1304 05/18/17 1232  NA 136 134* 136 134* 130*  K 4.2 4.0 4.1 4.3 3.6  CL 103 104 106 99* 99*  CO2 _0 GLUCOSE 86 100* 107* 123* 99  BUN 25* 21* 29* 20 17  CREATININE 1.33* 1.23 1.06 1.47* 1.18  CALCIUM 8.9 8.8* 8.7* 8.9 8.5*  GFRNONAA 53* 58* >60 47* >60  GFRAA >60 >60 >60 54* >60  PROT 6.6  6.5 6.1*  --   --   ALBUMIN 3.6 3.5 3.3*  --   --   AST _1 --   --   ALT _2 --   --   ALKPHOS 54 50 49  --   --   BILITOT 0.8 0.7 0.8  --   --     ASSESSMENT & PLAN:  Cancer Staging Adenocarcinoma of left lung, stage 3 (HCC) Staging form: Lung, AJCC 8th Edition - Clinical stage from 02/14/2017: Stage IIIB (cT2b, cN3, cM0) - Signed by [name], MD on 02/14/2017  1. Adenocarcinoma of left lung, stage 3 ([place])   2. Encounter for antineoplastic immunotherapy   3. AKI (acute kidney injury) ([place])   CT results discussed with patient. He has a patial response from concurrent chemotherapy and radiation. # patient has AKI, possibly due to recent contrast study. We'll schedule him to get IV fluid for hydration today.Encourage patient for oral hydration and by mouth intake.  # we'll hold today's immunotherapy until his kidney function resolved. I asked him to come back again tomorrow and have kidney function retested. If creatinine< 1.2, he can proceed with cycle 1 of durvalumab. Side effects of durvalumab has been discussed in details multiple times with patient and his wife. We discussed about this again today. All questions were answered. The patient knows to call the clinic with any problems questions or concerns.   [name], MD, PhD Hematology Oncology [facility] at [facility] Pager- [phone] 06/06/2017

## 2017-06-06 ENCOUNTER — Inpatient Hospital Stay: Payer: PPO

## 2017-06-06 ENCOUNTER — Encounter: Payer: Self-pay | Admitting: Oncology

## 2017-06-06 ENCOUNTER — Inpatient Hospital Stay (HOSPITAL_BASED_OUTPATIENT_CLINIC_OR_DEPARTMENT_OTHER): Payer: PPO | Admitting: Oncology

## 2017-06-06 VITALS — BP 130/79 | HR 67 | Temp 97.6°F | Resp 18 | Wt 179.0 lb

## 2017-06-06 DIAGNOSIS — M1990 Unspecified osteoarthritis, unspecified site: Secondary | ICD-10-CM | POA: Diagnosis not present

## 2017-06-06 DIAGNOSIS — Z5112 Encounter for antineoplastic immunotherapy: Secondary | ICD-10-CM | POA: Diagnosis not present

## 2017-06-06 DIAGNOSIS — Z7982 Long term (current) use of aspirin: Secondary | ICD-10-CM | POA: Diagnosis not present

## 2017-06-06 DIAGNOSIS — Z9221 Personal history of antineoplastic chemotherapy: Secondary | ICD-10-CM | POA: Diagnosis not present

## 2017-06-06 DIAGNOSIS — Z923 Personal history of irradiation: Secondary | ICD-10-CM | POA: Diagnosis not present

## 2017-06-06 DIAGNOSIS — Z86010 Personal history of colonic polyps: Secondary | ICD-10-CM | POA: Diagnosis not present

## 2017-06-06 DIAGNOSIS — R05 Cough: Secondary | ICD-10-CM

## 2017-06-06 DIAGNOSIS — C3492 Malignant neoplasm of unspecified part of left bronchus or lung: Secondary | ICD-10-CM

## 2017-06-06 DIAGNOSIS — Z87891 Personal history of nicotine dependence: Secondary | ICD-10-CM

## 2017-06-06 DIAGNOSIS — I10 Essential (primary) hypertension: Secondary | ICD-10-CM | POA: Diagnosis not present

## 2017-06-06 DIAGNOSIS — C770 Secondary and unspecified malignant neoplasm of lymph nodes of head, face and neck: Secondary | ICD-10-CM | POA: Diagnosis not present

## 2017-06-06 DIAGNOSIS — C3412 Malignant neoplasm of upper lobe, left bronchus or lung: Secondary | ICD-10-CM | POA: Diagnosis not present

## 2017-06-06 DIAGNOSIS — Z79899 Other long term (current) drug therapy: Secondary | ICD-10-CM

## 2017-06-06 DIAGNOSIS — E860 Dehydration: Secondary | ICD-10-CM

## 2017-06-06 DIAGNOSIS — N179 Acute kidney failure, unspecified: Secondary | ICD-10-CM | POA: Diagnosis not present

## 2017-06-06 DIAGNOSIS — Z803 Family history of malignant neoplasm of breast: Secondary | ICD-10-CM

## 2017-06-06 DIAGNOSIS — C3490 Malignant neoplasm of unspecified part of unspecified bronchus or lung: Secondary | ICD-10-CM

## 2017-06-06 LAB — CBC WITH DIFFERENTIAL/PLATELET
BASOS ABS: 0.1 10*3/uL (ref 0–0.1)
BASOS PCT: 1 %
EOS ABS: 0.6 10*3/uL (ref 0–0.7)
Eosinophils Relative: 12 %
HEMATOCRIT: 32.3 % — AB (ref 40.0–52.0)
HEMOGLOBIN: 10.9 g/dL — AB (ref 13.0–18.0)
Lymphocytes Relative: 15 %
Lymphs Abs: 0.7 10*3/uL — ABNORMAL LOW (ref 1.0–3.6)
MCH: 32.2 pg (ref 26.0–34.0)
MCHC: 33.6 g/dL (ref 32.0–36.0)
MCV: 95.6 fL (ref 80.0–100.0)
Monocytes Absolute: 0.6 10*3/uL (ref 0.2–1.0)
Monocytes Relative: 13 %
NEUTROS PCT: 59 %
Neutro Abs: 2.9 10*3/uL (ref 1.4–6.5)
Platelets: 217 10*3/uL (ref 150–440)
RBC: 3.38 MIL/uL — AB (ref 4.40–5.90)
RDW: 19.4 % — ABNORMAL HIGH (ref 11.5–14.5)
WBC: 4.9 10*3/uL (ref 3.8–10.6)

## 2017-06-06 LAB — COMPREHENSIVE METABOLIC PANEL
ALK PHOS: 47 U/L (ref 38–126)
ALT: 16 U/L — AB (ref 17–63)
AST: 19 U/L (ref 15–41)
Albumin: 3.4 g/dL — ABNORMAL LOW (ref 3.5–5.0)
Anion gap: 8 (ref 5–15)
BUN: 29 mg/dL — ABNORMAL HIGH (ref 6–20)
CALCIUM: 9 mg/dL (ref 8.9–10.3)
CO2: 27 mmol/L (ref 22–32)
CREATININE: 1.3 mg/dL — AB (ref 0.61–1.24)
Chloride: 101 mmol/L (ref 101–111)
GFR, EST NON AFRICAN AMERICAN: 54 mL/min — AB (ref >60)
Glucose, Bld: 100 mg/dL — ABNORMAL HIGH (ref 65–99)
Potassium: 4.5 mmol/L (ref 3.5–5.1)
SODIUM: 136 mmol/L (ref 135–145)
Total Bilirubin: 0.5 mg/dL (ref 0.3–1.2)
Total Protein: 6.7 g/dL (ref 6.5–8.1)

## 2017-06-06 MED ORDER — SODIUM CHLORIDE 0.9 % IV SOLN
Freq: Once | INTRAVENOUS | Status: AC
  Administered 2017-06-06: 12:00:00 via INTRAVENOUS
  Filled 2017-06-06: qty 1000

## 2017-06-06 MED ORDER — HEPARIN SOD (PORK) LOCK FLUSH 100 UNIT/ML IV SOLN
500.0000 [IU] | Freq: Once | INTRAVENOUS | Status: AC | PRN
  Administered 2017-06-06: 500 [IU]
  Filled 2017-06-06: qty 5

## 2017-06-06 MED ORDER — SODIUM CHLORIDE 0.9% FLUSH
10.0000 mL | INTRAVENOUS | Status: DC | PRN
  Administered 2017-06-06: 10 mL
  Filled 2017-06-06: qty 10

## 2017-06-07 ENCOUNTER — Ambulatory Visit: Payer: PPO

## 2017-06-07 ENCOUNTER — Encounter: Payer: Self-pay | Admitting: Oncology

## 2017-06-07 ENCOUNTER — Inpatient Hospital Stay: Payer: PPO

## 2017-06-07 ENCOUNTER — Encounter: Payer: Self-pay | Admitting: *Deleted

## 2017-06-07 VITALS — BP 140/78 | HR 63 | Temp 97.0°F | Resp 20

## 2017-06-07 DIAGNOSIS — Z5112 Encounter for antineoplastic immunotherapy: Secondary | ICD-10-CM | POA: Diagnosis not present

## 2017-06-07 DIAGNOSIS — C3492 Malignant neoplasm of unspecified part of left bronchus or lung: Secondary | ICD-10-CM

## 2017-06-07 LAB — COMPREHENSIVE METABOLIC PANEL
ALT: 16 U/L — AB (ref 17–63)
AST: 20 U/L (ref 15–41)
Albumin: 3.3 g/dL — ABNORMAL LOW (ref 3.5–5.0)
Alkaline Phosphatase: 48 U/L (ref 38–126)
Anion gap: 8 (ref 5–15)
BUN: 26 mg/dL — AB (ref 6–20)
CHLORIDE: 101 mmol/L (ref 101–111)
CO2: 26 mmol/L (ref 22–32)
CREATININE: 1.28 mg/dL — AB (ref 0.61–1.24)
Calcium: 8.9 mg/dL (ref 8.9–10.3)
GFR calc Af Amer: >60 mL/min (ref >60)
GFR calc non Af Amer: 55 mL/min — ABNORMAL LOW (ref >60)
Glucose, Bld: 103 mg/dL — ABNORMAL HIGH (ref 65–99)
POTASSIUM: 4.2 mmol/L (ref 3.5–5.1)
SODIUM: 135 mmol/L (ref 135–145)
Total Bilirubin: 0.5 mg/dL (ref 0.3–1.2)
Total Protein: 6.8 g/dL (ref 6.5–8.1)

## 2017-06-07 MED ORDER — HEPARIN SOD (PORK) LOCK FLUSH 100 UNIT/ML IV SOLN: 500.0000 [IU] | Freq: Once | INTRAVENOUS | Status: DC | PRN

## 2017-06-07 MED ORDER — SODIUM CHLORIDE 0.9% FLUSH
10.0000 mL | INTRAVENOUS | Status: DC | PRN
  Filled 2017-06-07: qty 10

## 2017-06-07 MED ORDER — HEPARIN SOD (PORK) LOCK FLUSH 100 UNIT/ML IV SOLN
500.0000 [IU] | Freq: Once | INTRAVENOUS | Status: AC
  Administered 2017-06-07: 500 [IU] via INTRAVENOUS
  Filled 2017-06-07: qty 5

## 2017-06-07 MED ORDER — SODIUM CHLORIDE 0.9 % IV SOLN
10.0000 mg/kg | Freq: Once | INTRAVENOUS | Status: AC
  Administered 2017-06-07: 860 mg via INTRAVENOUS
  Filled 2017-06-07: qty 7.2

## 2017-06-07 MED ORDER — SODIUM CHLORIDE 0.9 % IV SOLN
Freq: Once | INTRAVENOUS | Status: AC
  Administered 2017-06-07: 10:00:00 via INTRAVENOUS
  Filled 2017-06-07: qty 1000

## 2017-06-07 MED ORDER — SODIUM CHLORIDE 0.9% FLUSH
10.0000 mL | INTRAVENOUS | Status: DC | PRN
  Administered 2017-06-07: 10 mL via INTRAVENOUS
  Filled 2017-06-07: qty 10

## 2017-06-07 NOTE — Progress Notes (Signed)
Nutrition Assessment   Reason for Assessment:   Patient identified on Malnutrition Screening report for weight loss and poor appetite  ASSESSMENT:  72 year old male with lung cancer.  Patient is s/p [place] and taxol with concurrent radiation treatment and additional lung boost radiation treatment.  Starting immunotherapy today of durvalumab.  Past medical history of HTN  Met with patient and wife during infusion this am.  Patient reports appetite is better and pain in esophagus from radiation treatment has improved, pain still there at times but has improved.  Patient reports he is eating better but still not the volume of food of what he is used to eating.  Reports drinks boost high protein in the morning and will have oatmeal. For lunch is ham sandwich and supper is meat, vegetable and starch.  Patient reports fatigue is an issue as well.   Nutrition Focused Physical Exam: deferred  Medications: MVI, zofran, compazine, carafate, pantoprazole  Labs: glucose 103, BUN 26, creatinine 1.28  Anthropometrics:   Height: 73 inches Weight: 179 lb UBW: thinks he is down about 10 lbs, but unsure time frame BMI: 23    Estimated Energy Needs  Kcals: 2025-2430 calories/d Protein: 97-121 g/d Fluid: > 2 L/d  NUTRITION DIAGNOSIS: Inadequate oral intake related to cancer related treatment side effects as evidenced by reduced po intake, burning/pain on swallowing, weight loss   MALNUTRITION DIAGNOSIS: continue to monitor   INTERVENTION:   Discussed ways to increase calories and protein. Also discussed ways to prepare foods to make it easier to swallow Encouraged oral nutrition supplements 1-2 per day.  Samples and coupons given Contact information given to patient today    MONITORING, EVALUATION, GOAL: Patient will consume adequate calories and protein to maintain weight   NEXT VISIT: as needed  Myrna Vonseggern B. Zenia Resides, [place], [place] Registered Dietitian [phone] (pager)

## 2017-06-07 NOTE — Addendum Note (Signed)
Addended by: [name] on: 06/07/2017 09:26 AM   Modules accepted: Orders

## 2017-06-18 DIAGNOSIS — R0789 Other chest pain: Secondary | ICD-10-CM | POA: Diagnosis not present

## 2017-06-21 NOTE — Progress Notes (Signed)
Hematology/Oncology Follow Up Note [facility] Telephone:([phone] Fax:([phone]   Patient Care Team: [name], MD as PCP - General (Family Medicine) [name], [name], MD (General Surgery) [name], MD as Consulting Physician (Oncology) [name], RN as Registered Nurse  Reason for Visit: follow up for treatment of lung cancer  HISTORY OF PRESENTING ILLNESS:  Marc Gray 72 y.o.  male who presents for treatment of lung cancer Patient felt a neck mass for about 7-8 months and lately also atypical chest pain and he went to see primary care physician Marc Gray. CT neck was done which showed multiple cervical/supraclavical lymphadenopathy as well as left upper lobe mass. 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   Colon CA was listed in his history, however reviewing his surgical pathology from colonoscopy on 04/15/2014, during which he had polyps removed and all are negative for cancer.patient is scheduled for colonoscopy screening this year. # Patient is s/p [place] and taxol concurrent RT for treatment of Stage IIIB Lung cancer. He recieved additional lung boost RT.   INTERVAL HISTORY Patient he presents for evaluation prior to immunotherapy. He tolerates immunotherapy well.  Denies syncope, dizziness, chest pain.  He reports heartburn indigestion symptoms.  He used to take PPI however only as needed.  Has some chronic cough  Denies any shortness of breath, abdominal pain or leg swelling.  Review of Systems  Constitutional: Negative for appetite change, chills, fatigue and unexpected weight change.  HENT:   Negative for hearing loss and mouth sores.   Eyes: Negative for eye problems.  Respiratory: Negative for chest tightness.   Cardiovascular: Negative for chest pain.  Gastrointestinal: Negative for abdominal distention and abdominal pain.  Endocrine: Negative for hot flashes.  Genitourinary: Negative for  difficulty urinating and frequency.   Musculoskeletal: Negative for arthralgias and flank pain.  Skin: Negative for itching.  Neurological: Negative for dizziness, headaches and seizures.  Hematological: Negative for adenopathy.  Psychiatric/Behavioral: Negative for decreased concentration. The patient is not nervous/anxious.     MEDICAL HISTORY:  Past Medical History:  Diagnosis Date  . Anxiety   . Arthritis    SHOULDER  . Cataract   . Colon cancer ([place]) 2015   Pt states during his colonoscopy he had cancerous polyps removed.   . Depression    SINCE DIAGNOSIS  . Dyspnea    DOE  . Enlarged prostate   . Hypertension   . Lung cancer [facility])    Aug 2018  . Pain    DUE TO LUNG ISSUE    SURGICAL HISTORY: Past Surgical History:  Procedure Laterality Date  . CATARACT EXTRACTION W/ INTRAOCULAR LENS  IMPLANT, BILATERAL    . COLONOSCOPY    . EYE SURGERY    . LYMPH GLAND EXCISION N/A 02/09/2017   Procedure: CERVICAL LYMPH NODE BIOPSY;  Surgeon: Robert Bellow, MD;  Location: [facility];  Service: General;  Laterality: N/A;  . PORTACATH PLACEMENT Right 02/23/2017   Procedure: INSERTION PORT-A-CATH;  Surgeon: Robert Bellow, MD;  Location: [facility];  Service: General;  Laterality: Right;  . PROSTATE SURGERY    . TONSILLECTOMY      SOCIAL HISTORY: Social History   Socioeconomic History  . Marital status: Married    Spouse name: Not on file  . Number of children: Not on file  . Years of education: Not on file  . Highest education level: Not on file  Social Needs  . Financial resource strain: Not on file  .  Food insecurity - worry: Not on file  . Food insecurity - inability: Not on file  . Transportation needs - medical: Not on file  . Transportation needs - non-medical: Not on file  Occupational History  . Not on file  Tobacco Use  . Smoking status: Former Smoker    Packs/day: 1.00    Years: 53.00    Pack years: 53.00    Types: Cigarettes    Last attempt to quit:  04/14/2017    Years since quitting: 0.1  . Smokeless tobacco: Never Used  . Tobacco comment: Quit November 2018  Substance and Sexual Activity  . Alcohol use: Yes    Comment: occassional  . Drug use: No  . Sexual activity: Yes  Other Topics Concern  . Not on file  Social History Narrative  . Not on file    FAMILY HISTORY: Family History  Problem Relation Age of Onset  . Breast cancer Maternal Grandmother   . Dementia Mother   . Diabetes Father   . Stroke Father     ALLERGIES:  has No Known Allergies.  MEDICATIONS:  Current Outpatient Medications  Medication Sig Dispense Refill  . aspirin 325 MG tablet Take 650 mg by mouth daily.     . Chlorpheniramine-Phenylephrine (SINUS & ALLERGY) 4-10 MG tablet Take 1 tablet by mouth daily as needed.     . lidocaine-prilocaine (EMLA) cream Apply over port 1-2 hours prior to chemotherapy treatment.  Cover with plastic wrap. 30 g 1  . morphine 20 MG/5ML solution Take 2.5 mLs (10 mg total) by mouth every 6 (six) hours as needed for pain (before meal). 100 mL 0  . Multiple Vitamin (MULTI-VITAMINS) TABS Take 1 tablet by mouth daily.     . Nicotine 21-14-7 MG/24HR KIT Place 1 patch daily onto the skin. 1 each 0  . nystatin (MYCOSTATIN) 100000 UNIT/ML suspension Take 5 mLs (500,000 Units total) 4 (four) times daily by mouth. Swish and swallow 473 mL 0  . ondansetron (ZOFRAN) 8 MG tablet Take 1 tablet (8 mg total) by mouth every 8 (eight) hours as needed for nausea or vomiting. 60 tablet 1  . pantoprazole (PROTONIX) 40 MG tablet Take 1 tablet (40 mg total) by mouth daily. 30 tablet 0  . prochlorperazine (COMPAZINE) 10 MG tablet Take 1 tablet (10 mg total) by mouth every 6 (six) hours as needed for nausea or vomiting. 60 tablet 2  . sucralfate (CARAFATE) 1 g tablet Take 1 tablet (1 g total) by mouth 3 (three) times daily. Dissolve in 2-3 tbsp warm water, swish and swallow 90 tablet 3   No current facility-administered medications for this visit.      Facility-Administered Medications Ordered in Other Visits  Medication Dose Route Frequency Provider Last Rate Last Dose  . sodium chloride flush (NS) 0.9 % injection 10 mL  10 mL Intravenous PRN [name], MD   10 mL at 04/03/17 2229      .  PHYSICAL EXAMINATION: ECOG PERFORMANCE STATUS: 1 - Symptomatic but completely ambulatory Vitals:   06/22/17 1046 06/22/17 1051  BP:  116/74  Pulse:  60  Resp: 12   Temp:  (!) 97.4 F (36.3 C)   Filed Weights   06/22/17 1046  Weight: 180 lb (81.6 kg)  GENERAL: No distress, thin SKIN:  No rashes or significant lesions  HEAD: Normocephalic, No masses, lesions, tenderness or abnormalities  EYES: Conjunctiva are pink, non icteric ENT: External ears normal ,lips , buccal mucosa, and tongue normal and  mucous membranes are moist  LYMPH: Left supra clavicle lymphadenopathy no longer palpable.   LUNGS: Clear to auscultation, no crackles or wheezes HEART: Regular rate & rhythm, no murmurs, no gallops, S1 normal and S2 normal  ABDOMEN: Abdomen soft, non-tender, normal bowel sounds, MUSCULOSKELETAL: No CVA tenderness and no tenderness on percussion of the back or rib cage.  EXTREMITIES: No edema, no skin discoloration or tenderness NEURO: Alert & oriented, no focal motor/sensory deficits.  LABORATORY DATA:  I have reviewed the data as listed Lab Results  Component Value Date   WBC 4.7 06/22/2017   HGB 11.2 (L) 06/22/2017   HCT 33.7 (L) 06/22/2017   MCV 96.5 06/22/2017   PLT 172 06/22/2017   Recent Labs    06/06/17 1014 06/07/17 0848 06/22/17 1036  NA 136 135 133*  K 4.5 4.2 4.2  CL 101 101 101  CO2 _0 GLUCOSE 100* 103* 100*  BUN 29* 26* 32*  CREATININE 1.30* 1.28* 1.27*  CALCIUM 9.0 8.9 8.8*  GFRNONAA 54* 55* 56*  GFRAA >60 >60 >60  PROT 6.7 6.8 6.6  ALBUMIN 3.4* 3.3* 3.3*  AST _1 ALT 16* 16* 16*  ALKPHOS 47 48 53  BILITOT 0.5 0.5 0.3    ASSESSMENT & PLAN:  Cancer Staging Adenocarcinoma of left lung, stage  3 (HCC) Staging form: Lung, AJCC 8th Edition - Clinical stage from 02/14/2017: Stage IIIB (cT2b, cN3, cM0) - Signed by [name], MD on 02/14/2017  1. Adenocarcinoma of left lung, stage 3 ([place])   2. Encounter for antineoplastic immunotherapy   3. Gastroesophageal reflux disease, esophagitis presence not specified    Tolerates immunotherapy well. TSH normal.  #Proceed with Durvalumab today. #Advised patient to start Protonix daily as scheduled. Return visit: 2 weeks prior to next cycles of Durvalumab.  All questions were answered. The patient knows to call the clinic with any problems questions or concerns.   [name], MD, PhD Hematology Oncology [facility] at [facility] Pager- [phone] 06/22/2017

## 2017-06-22 ENCOUNTER — Encounter: Payer: Self-pay | Admitting: Oncology

## 2017-06-22 ENCOUNTER — Inpatient Hospital Stay: Payer: PPO

## 2017-06-22 ENCOUNTER — Other Ambulatory Visit: Payer: Self-pay

## 2017-06-22 ENCOUNTER — Inpatient Hospital Stay (HOSPITAL_BASED_OUTPATIENT_CLINIC_OR_DEPARTMENT_OTHER): Payer: PPO | Admitting: Oncology

## 2017-06-22 VITALS — BP 116/74 | HR 60 | Temp 97.4°F | Resp 12 | Ht 73.0 in | Wt 180.0 lb

## 2017-06-22 DIAGNOSIS — M1990 Unspecified osteoarthritis, unspecified site: Secondary | ICD-10-CM | POA: Diagnosis not present

## 2017-06-22 DIAGNOSIS — Z5112 Encounter for antineoplastic immunotherapy: Secondary | ICD-10-CM

## 2017-06-22 DIAGNOSIS — C3492 Malignant neoplasm of unspecified part of left bronchus or lung: Secondary | ICD-10-CM

## 2017-06-22 DIAGNOSIS — K219 Gastro-esophageal reflux disease without esophagitis: Secondary | ICD-10-CM | POA: Diagnosis not present

## 2017-06-22 DIAGNOSIS — Z79899 Other long term (current) drug therapy: Secondary | ICD-10-CM | POA: Diagnosis not present

## 2017-06-22 DIAGNOSIS — Z9221 Personal history of antineoplastic chemotherapy: Secondary | ICD-10-CM | POA: Diagnosis not present

## 2017-06-22 DIAGNOSIS — Z7982 Long term (current) use of aspirin: Secondary | ICD-10-CM

## 2017-06-22 DIAGNOSIS — Z923 Personal history of irradiation: Secondary | ICD-10-CM

## 2017-06-22 DIAGNOSIS — N400 Enlarged prostate without lower urinary tract symptoms: Secondary | ICD-10-CM | POA: Diagnosis not present

## 2017-06-22 DIAGNOSIS — F418 Other specified anxiety disorders: Secondary | ICD-10-CM | POA: Diagnosis not present

## 2017-06-22 DIAGNOSIS — C770 Secondary and unspecified malignant neoplasm of lymph nodes of head, face and neck: Secondary | ICD-10-CM

## 2017-06-22 DIAGNOSIS — C3412 Malignant neoplasm of upper lobe, left bronchus or lung: Secondary | ICD-10-CM

## 2017-06-22 DIAGNOSIS — I10 Essential (primary) hypertension: Secondary | ICD-10-CM

## 2017-06-22 DIAGNOSIS — Z803 Family history of malignant neoplasm of breast: Secondary | ICD-10-CM

## 2017-06-22 DIAGNOSIS — Z87891 Personal history of nicotine dependence: Secondary | ICD-10-CM

## 2017-06-22 DIAGNOSIS — Z86010 Personal history of colonic polyps: Secondary | ICD-10-CM | POA: Diagnosis not present

## 2017-06-22 LAB — COMPREHENSIVE METABOLIC PANEL
ALBUMIN: 3.3 g/dL — AB (ref 3.5–5.0)
ALT: 16 U/L — ABNORMAL LOW (ref 17–63)
ANION GAP: 6 (ref 5–15)
AST: 19 U/L (ref 15–41)
Alkaline Phosphatase: 53 U/L (ref 38–126)
BILIRUBIN TOTAL: 0.3 mg/dL (ref 0.3–1.2)
BUN: 32 mg/dL — ABNORMAL HIGH (ref 6–20)
CO2: 26 mmol/L (ref 22–32)
Calcium: 8.8 mg/dL — ABNORMAL LOW (ref 8.9–10.3)
Chloride: 101 mmol/L (ref 101–111)
Creatinine, Ser: 1.27 mg/dL — ABNORMAL HIGH (ref 0.61–1.24)
GFR calc Af Amer: >60 mL/min (ref >60)
GFR, EST NON AFRICAN AMERICAN: 56 mL/min — AB (ref >60)
GLUCOSE: 100 mg/dL — AB (ref 65–99)
POTASSIUM: 4.2 mmol/L (ref 3.5–5.1)
Sodium: 133 mmol/L — ABNORMAL LOW (ref 135–145)
TOTAL PROTEIN: 6.6 g/dL (ref 6.5–8.1)

## 2017-06-22 LAB — TSH: TSH: 3.152 u[IU]/mL (ref 0.350–4.500)

## 2017-06-22 LAB — CBC WITH DIFFERENTIAL/PLATELET
BASOS ABS: 0 10*3/uL (ref 0–0.1)
Basophils Relative: 1 %
Eosinophils Absolute: 0.4 10*3/uL (ref 0–0.7)
Eosinophils Relative: 8 %
HEMATOCRIT: 33.7 % — AB (ref 40.0–52.0)
HEMOGLOBIN: 11.2 g/dL — AB (ref 13.0–18.0)
LYMPHS PCT: 11 %
Lymphs Abs: 0.5 10*3/uL — ABNORMAL LOW (ref 1.0–3.6)
MCH: 32.2 pg (ref 26.0–34.0)
MCHC: 33.3 g/dL (ref 32.0–36.0)
MCV: 96.5 fL (ref 80.0–100.0)
Monocytes Absolute: 0.8 10*3/uL (ref 0.2–1.0)
Monocytes Relative: 17 %
NEUTROS ABS: 2.9 10*3/uL (ref 1.4–6.5)
NEUTROS PCT: 63 %
Platelets: 172 10*3/uL (ref 150–440)
RBC: 3.49 MIL/uL — AB (ref 4.40–5.90)
RDW: 17.1 % — ABNORMAL HIGH (ref 11.5–14.5)
WBC: 4.7 10*3/uL (ref 3.8–10.6)

## 2017-06-22 MED ORDER — HEPARIN SOD (PORK) LOCK FLUSH 100 UNIT/ML IV SOLN
500.0000 [IU] | Freq: Once | INTRAVENOUS | Status: AC | PRN
  Administered 2017-06-22: 500 [IU]
  Filled 2017-06-22: qty 5

## 2017-06-22 MED ORDER — SODIUM CHLORIDE 0.9 % IV SOLN
10.0000 mg/kg | Freq: Once | INTRAVENOUS | Status: AC
  Administered 2017-06-22: 860 mg via INTRAVENOUS
  Filled 2017-06-22: qty 10

## 2017-06-22 MED ORDER — SODIUM CHLORIDE 0.9 % IV SOLN
Freq: Once | INTRAVENOUS | Status: AC
  Administered 2017-06-22: 11:00:00 via INTRAVENOUS
  Filled 2017-06-22: qty 1000

## 2017-06-22 NOTE — Progress Notes (Signed)
Patient here for follow up. No changes since last appt.

## 2017-06-25 ENCOUNTER — Ambulatory Visit: Payer: PPO | Admitting: Radiation Oncology

## 2017-06-29 DIAGNOSIS — R079 Chest pain, unspecified: Secondary | ICD-10-CM | POA: Diagnosis not present

## 2017-06-29 DIAGNOSIS — R0602 Shortness of breath: Secondary | ICD-10-CM | POA: Diagnosis not present

## 2017-07-02 ENCOUNTER — Encounter: Payer: Self-pay | Admitting: Radiation Oncology

## 2017-07-02 ENCOUNTER — Ambulatory Visit
Admission: RE | Admit: 2017-07-02 | Discharge: 2017-07-02 | Disposition: A | Discharge status: Home / Self Care | Payer: PPO | Source: Ambulatory Visit | Attending: Radiation Oncology | Admitting: Radiation Oncology

## 2017-07-02 ENCOUNTER — Other Ambulatory Visit: Payer: Self-pay

## 2017-07-02 VITALS — BP 127/74 | HR 74 | Temp 97.4°F | Resp 20 | Wt 183.4 lb

## 2017-07-02 DIAGNOSIS — Z923 Personal history of irradiation: Secondary | ICD-10-CM | POA: Insufficient documentation

## 2017-07-02 DIAGNOSIS — C3492 Malignant neoplasm of unspecified part of left bronchus or lung: Secondary | ICD-10-CM | POA: Diagnosis not present

## 2017-07-02 NOTE — Progress Notes (Signed)
Radiation Oncology Follow up Note  Name: Marc Gray   Date:   07/02/2017 MRN:  [id number] DOB: 09-02-1945    This 72 y.o. male presents to the clinic today for one-month follow-up status post concurrent chemoradiation for stage IIIb adenocarcinoma of the left lung.  REFERRING PROVIDER: [name], MD  HPI: Patient is a 72 year old male now at 1 month having completed concurrent chemoradiation for stage IIIb (T2b N3M0) adenocarcinoma of the left lung.  Seen today in routine follow-up he is doing fairly well.  He specifically denies dysphasia cough or hemoptysis.[name]  He is currently being treated with Durvalumab  and tolerating that well.    COMPLICATIONS OF TREATMENT: none  FOLLOW UP COMPLIANCE: keeps appointments   PHYSICAL EXAM:  BP 127/74   Pulse 74   Temp (!) 97.4 F (36.3 C)   Resp 20   Wt 183 lb 6.8 oz (83.2 kg)   BMI 24.20 kg/m  Well-developed well-nourished patient in NAD. HEENT reveals PERLA, EOMI, discs not visualized.  Oral cavity is clear. No oral mucosal lesions are identified. Neck is clear without evidence of cervical or supraclavicular adenopathy. Lungs are clear to A&P. Cardiac examination is essentially unremarkable with regular rate and rhythm without murmur rub or thrill. Abdomen is benign with no organomegaly or masses noted. Motor sensory and DTR levels are equal and symmetric in the upper and lower extremities. Cranial nerves II through XII are grossly intact. Proprioception is intact. No peripheral adenopathy or edema is identified. No motor or sensory levels are noted. Crude visual fields are within normal range.  RADIOLOGY RESULTS: No current films to review  PLAN: Present time he continues under immunotherapy treatment by medical oncology.  I am pleased with his overall progress.  He has a CT scan I believe scheduled in 3 months and I will review that when it becomes available.  I have asked to see him back in 4 months for follow-up.  He knows  to call sooner with any concerns.  I would like to take this opportunity to thank you for allowing me to participate in the care of your patient.Noreene Filbert, MD

## 2017-07-03 DIAGNOSIS — R0602 Shortness of breath: Secondary | ICD-10-CM | POA: Insufficient documentation

## 2017-07-03 DIAGNOSIS — R079 Chest pain, unspecified: Secondary | ICD-10-CM | POA: Insufficient documentation

## 2017-07-05 NOTE — Progress Notes (Signed)
Hematology/Oncology Follow Up Note [facility] Telephone:([phone] Fax:(336) [phone]   Patient Care Team: [name], MD as PCP - General (Family Medicine) [name], [name], MD (General Surgery) [name], MD as Consulting Physician (Oncology) [name], RN as Registered Nurse  Reason for Visit: follow up for treatment of lung cancer  HISTORY OF PRESENTING ILLNESS:  Marc Gray 72 y.o.  male who presents for treatment of Stage IIIB (cT2b, cN3, cM0)  Adenocarcinoma of lung.  # Patient is s/p [place] and taxol concurrent RT for treatment of Stage IIIB Lung cancer. He recieved additional lung boost RT Currently on Durvalumab maintenance.   # Colon CA was listed in his history, however reviewing his surgical pathology from colonoscopy on 04/15/2014, during which he had polyps removed and all are negative for cancer.patient is scheduled for colonoscopy screening this year. .   INTERVAL HISTORY Patient he presents for evaluation prior to immunotherapy.  Patient tolerates immunotherapy well.  Denies any syncope, dizziness, chest pain, diarrhea, skin rash,.  Occasionally he feels shortness of breath with exertion.  He still feels fatigued.  He has chronic cough which is at his baseline. He has intermittent left anterior chest discomfort, described as achiness.  He is going to have a stress test next week.  Currently no chest pain.  Review of Systems  Constitutional: Negative for appetite change, chills, fatigue, fever and unexpected weight change.  HENT:   Negative for hearing loss, lump/mass and mouth sores.   Eyes: Negative for eye problems and icterus.  Respiratory: Positive for cough. Negative for chest tightness.        SOB with exertion.   Cardiovascular: Negative for chest pain and leg swelling.  Gastrointestinal: Negative for abdominal distention, abdominal pain and diarrhea.  Endocrine: Negative for hot flashes.  Genitourinary:  Negative for difficulty urinating, dysuria and frequency.   Musculoskeletal: Negative for arthralgias and flank pain.  Skin: Negative for itching and rash.  Neurological: Negative for dizziness, headaches, light-headedness and seizures.  Hematological: Negative for adenopathy.  Psychiatric/Behavioral: Negative for confusion and decreased concentration. The patient is not nervous/anxious.     MEDICAL HISTORY:  Past Medical History:  Diagnosis Date  . Anxiety   . Arthritis    SHOULDER  . Cataract   . Colon cancer ([place]) 2015   Pt states during his colonoscopy he had cancerous polyps removed.   . Depression    SINCE DIAGNOSIS  . Dyspnea    DOE  . Enlarged prostate   . Hypertension   . Lung cancer [facility])    Aug 2018  . Pain    DUE TO LUNG ISSUE    SURGICAL HISTORY: Past Surgical History:  Procedure Laterality Date  . CATARACT EXTRACTION W/ INTRAOCULAR LENS  IMPLANT, BILATERAL    . COLONOSCOPY    . EYE SURGERY    . LYMPH GLAND EXCISION N/A 02/09/2017   Procedure: CERVICAL LYMPH NODE BIOPSY;  Surgeon: Robert Bellow, MD;  Location: [facility];  Service: General;  Laterality: N/A;  . PORTACATH PLACEMENT Right 02/23/2017   Procedure: INSERTION PORT-A-CATH;  Surgeon: Robert Bellow, MD;  Location: [facility];  Service: General;  Laterality: Right;  . PROSTATE SURGERY    . TONSILLECTOMY      SOCIAL HISTORY: Social History   Socioeconomic History  . Marital status: Married    Spouse name: Not on file  . Number of children: Not on file  . Years of education: Not on file  . Highest  education level: Not on file  Social Needs  . Financial resource strain: Not on file  . Food insecurity - worry: Not on file  . Food insecurity - inability: Not on file  . Transportation needs - medical: Not on file  . Transportation needs - non-medical: Not on file  Occupational History  . Not on file  Tobacco Use  . Smoking status: Former Smoker    Packs/day: 1.00    Years: 53.00    Pack  years: 53.00    Types: Cigarettes    Last attempt to quit: 04/14/2017    Years since quitting: 0.2  . Smokeless tobacco: Never Used  . Tobacco comment: Quit November 2018  Substance and Sexual Activity  . Alcohol use: Yes    Comment: occassional  . Drug use: No  . Sexual activity: Yes  Other Topics Concern  . Not on file  Social History Narrative  . Not on file    FAMILY HISTORY: Family History  Problem Relation Age of Onset  . Breast cancer Maternal Grandmother   . Dementia Mother   . Diabetes Father   . Stroke Father     ALLERGIES:  has No Known Allergies.  MEDICATIONS:  Current Outpatient Medications  Medication Sig Dispense Refill  . aspirin 325 MG tablet Take 650 mg by mouth daily.     . Chlorpheniramine-Phenylephrine (SINUS & ALLERGY) 4-10 MG tablet Take 1 tablet by mouth daily as needed.     . lidocaine-prilocaine (EMLA) cream Apply over port 1-2 hours prior to chemotherapy treatment.  Cover with plastic wrap. 30 g 1  . morphine 20 MG/5ML solution Take 2.5 mLs (10 mg total) by mouth every 6 (six) hours as needed for pain (before meal). 100 mL 0  . Multiple Vitamin (MULTI-VITAMINS) TABS Take 1 tablet by mouth daily.     . Nicotine 21-14-7 MG/24HR KIT Place 1 patch daily onto the skin. 1 each 0  . nystatin (MYCOSTATIN) 100000 UNIT/ML suspension Take 5 mLs (500,000 Units total) 4 (four) times daily by mouth. Swish and swallow (Patient not taking: Reported on 07/02/2017) 473 mL 0  . ondansetron (ZOFRAN) 8 MG tablet Take 1 tablet (8 mg total) by mouth every 8 (eight) hours as needed for nausea or vomiting. 60 tablet 1  . pantoprazole (PROTONIX) 40 MG tablet Take 1 tablet (40 mg total) by mouth daily. 30 tablet 0  . prochlorperazine (COMPAZINE) 10 MG tablet Take 1 tablet (10 mg total) by mouth every 6 (six) hours as needed for nausea or vomiting. 60 tablet 2  . sucralfate (CARAFATE) 1 g tablet Take 1 tablet (1 g total) by mouth 3 (three) times daily. Dissolve in 2-3 tbsp warm  water, swish and swallow 90 tablet 3   No current facility-administered medications for this visit.    Facility-Administered Medications Ordered in Other Visits  Medication Dose Route Frequency Provider Last Rate Last Dose  . sodium chloride flush (NS) 0.9 % injection 10 mL  10 mL Intravenous PRN [name], MD   10 mL at 04/03/17 1275      .  PHYSICAL EXAMINATION: ECOG PERFORMANCE STATUS: 1 - Symptomatic but completely ambulatory Vitals:   07/06/17 0826 07/06/17 0831  BP:  120/74  Pulse:  65  Resp: 12   Temp:  (!) 97.4 F (36.3 C)   Filed Weights   07/06/17 0826  Weight: 185 lb 3.2 oz (84 kg)  Physical Exam  Constitutional: He is oriented to person, place, and time. No distress.  HENT:  Head: Normocephalic and atraumatic.  Mouth/Throat: No oropharyngeal exudate.  Eyes: Conjunctivae and EOM are normal. Pupils are equal, round, and reactive to light. No scleral icterus.  Neck: Normal range of motion. Neck supple.  Cardiovascular: Normal rate and regular rhythm.  No murmur heard. Pulmonary/Chest: Effort normal and breath sounds normal. No stridor. He has no wheezes. He has no rales.  Decreased sound bilaterally lower lobe.   Abdominal: Soft. Bowel sounds are normal. He exhibits no distension.  Musculoskeletal: Normal range of motion. He exhibits no edema.  Neurological: He is alert and oriented to person, place, and time. No cranial nerve deficit.  Skin: Skin is warm and dry.  Psychiatric: Affect and judgment normal.   LABORATORY DATA:  I have reviewed the data as listed Lab Results  Component Value Date   WBC 6.0 07/06/2017   HGB 11.6 (L) 07/06/2017   HCT 34.3 (L) 07/06/2017   MCV 97.2 07/06/2017   PLT 196 07/06/2017   Recent Labs    06/07/17 0848 06/22/17 1036 07/06/17 0820  NA 135 133* 134*  K 4.2 4.2 4.2  CL 101 101 102  CO2 26 26 26   GLUCOSE 103* 100* 101*  BUN 26* 32* 26*  CREATININE 1.28* 1.27* 1.08  CALCIUM 8.9 8.8* 8.7*  GFRNONAA 55* 56* >60    GFRAA >60 >60 >60  PROT 6.8 6.6 6.5  ALBUMIN 3.3* 3.3* 3.3*  AST 20 19 24   ALT 16* 16* 20  ALKPHOS 48 53 54  BILITOT 0.5 0.3 0.4   TSH 3.152  ASSESSMENT & PLAN:  Cancer Staging Adenocarcinoma of left lung, stage 3 (HCC) Staging form: Lung, AJCC 8th Edition - Clinical stage from 02/14/2017: Stage IIIB (cT2b, cN3, cM0) - Signed by [name], MD on 02/14/2017  1. Adenocarcinoma of left lung, stage 3 ([place])   2. Encounter for antineoplastic immunotherapy   3. Gastroesophageal reflux disease, esophagitis presence not specified    Tolerates immunotherapy well.  #Proceed with Durvalumab today. #Continue Protonix daily.  Return visit: 2 weeks prior to next cycles of Durvalumab.  All questions were answered. The patient knows to call the clinic with any problems questions or concerns.   [name], MD, PhD Hematology Oncology [facility] at [facility] Pager- [phone] 07/06/2017

## 2017-07-06 ENCOUNTER — Inpatient Hospital Stay (HOSPITAL_BASED_OUTPATIENT_CLINIC_OR_DEPARTMENT_OTHER): Payer: PPO | Admitting: Oncology

## 2017-07-06 ENCOUNTER — Encounter: Payer: Self-pay | Admitting: Oncology

## 2017-07-06 ENCOUNTER — Inpatient Hospital Stay: Payer: PPO

## 2017-07-06 ENCOUNTER — Inpatient Hospital Stay: Payer: PPO | Attending: Oncology

## 2017-07-06 ENCOUNTER — Other Ambulatory Visit: Payer: Self-pay

## 2017-07-06 VITALS — BP 120/74 | HR 65 | Temp 97.4°F | Resp 12 | Ht 73.0 in | Wt 185.2 lb

## 2017-07-06 DIAGNOSIS — R0789 Other chest pain: Secondary | ICD-10-CM | POA: Diagnosis not present

## 2017-07-06 DIAGNOSIS — Z5112 Encounter for antineoplastic immunotherapy: Secondary | ICD-10-CM

## 2017-07-06 DIAGNOSIS — I10 Essential (primary) hypertension: Secondary | ICD-10-CM | POA: Diagnosis not present

## 2017-07-06 DIAGNOSIS — Z79899 Other long term (current) drug therapy: Secondary | ICD-10-CM | POA: Diagnosis not present

## 2017-07-06 DIAGNOSIS — Z86010 Personal history of colonic polyps: Secondary | ICD-10-CM | POA: Diagnosis not present

## 2017-07-06 DIAGNOSIS — K219 Gastro-esophageal reflux disease without esophagitis: Secondary | ICD-10-CM | POA: Diagnosis not present

## 2017-07-06 DIAGNOSIS — C770 Secondary and unspecified malignant neoplasm of lymph nodes of head, face and neck: Secondary | ICD-10-CM

## 2017-07-06 DIAGNOSIS — N400 Enlarged prostate without lower urinary tract symptoms: Secondary | ICD-10-CM

## 2017-07-06 DIAGNOSIS — Z7982 Long term (current) use of aspirin: Secondary | ICD-10-CM

## 2017-07-06 DIAGNOSIS — F418 Other specified anxiety disorders: Secondary | ICD-10-CM | POA: Insufficient documentation

## 2017-07-06 DIAGNOSIS — Z803 Family history of malignant neoplasm of breast: Secondary | ICD-10-CM | POA: Insufficient documentation

## 2017-07-06 DIAGNOSIS — Z923 Personal history of irradiation: Secondary | ICD-10-CM | POA: Insufficient documentation

## 2017-07-06 DIAGNOSIS — Z9221 Personal history of antineoplastic chemotherapy: Secondary | ICD-10-CM

## 2017-07-06 DIAGNOSIS — R0609 Other forms of dyspnea: Secondary | ICD-10-CM

## 2017-07-06 DIAGNOSIS — M1990 Unspecified osteoarthritis, unspecified site: Secondary | ICD-10-CM

## 2017-07-06 DIAGNOSIS — R5383 Other fatigue: Secondary | ICD-10-CM | POA: Insufficient documentation

## 2017-07-06 DIAGNOSIS — R05 Cough: Secondary | ICD-10-CM | POA: Insufficient documentation

## 2017-07-06 DIAGNOSIS — R21 Rash and other nonspecific skin eruption: Secondary | ICD-10-CM | POA: Insufficient documentation

## 2017-07-06 DIAGNOSIS — C3492 Malignant neoplasm of unspecified part of left bronchus or lung: Secondary | ICD-10-CM

## 2017-07-06 DIAGNOSIS — Z87891 Personal history of nicotine dependence: Secondary | ICD-10-CM | POA: Diagnosis not present

## 2017-07-06 DIAGNOSIS — C3412 Malignant neoplasm of upper lobe, left bronchus or lung: Secondary | ICD-10-CM | POA: Insufficient documentation

## 2017-07-06 LAB — COMPREHENSIVE METABOLIC PANEL
ALBUMIN: 3.3 g/dL — AB (ref 3.5–5.0)
ALT: 20 U/L (ref 17–63)
AST: 24 U/L (ref 15–41)
Alkaline Phosphatase: 54 U/L (ref 38–126)
Anion gap: 6 (ref 5–15)
BUN: 26 mg/dL — AB (ref 6–20)
CALCIUM: 8.7 mg/dL — AB (ref 8.9–10.3)
CO2: 26 mmol/L (ref 22–32)
Chloride: 102 mmol/L (ref 101–111)
Creatinine, Ser: 1.08 mg/dL (ref 0.61–1.24)
GFR calc non Af Amer: >60 mL/min (ref >60)
Glucose, Bld: 101 mg/dL — ABNORMAL HIGH (ref 65–99)
Potassium: 4.2 mmol/L (ref 3.5–5.1)
SODIUM: 134 mmol/L — AB (ref 135–145)
TOTAL PROTEIN: 6.5 g/dL (ref 6.5–8.1)
Total Bilirubin: 0.4 mg/dL (ref 0.3–1.2)

## 2017-07-06 LAB — CBC WITH DIFFERENTIAL/PLATELET
BASOS ABS: 0 10*3/uL (ref 0–0.1)
BASOS PCT: 1 %
EOS PCT: 7 %
Eosinophils Absolute: 0.4 10*3/uL (ref 0–0.7)
HCT: 34.3 % — ABNORMAL LOW (ref 40.0–52.0)
Hemoglobin: 11.6 g/dL — ABNORMAL LOW (ref 13.0–18.0)
LYMPHS PCT: 12 %
Lymphs Abs: 0.7 10*3/uL — ABNORMAL LOW (ref 1.0–3.6)
MCH: 32.8 pg (ref 26.0–34.0)
MCHC: 33.7 g/dL (ref 32.0–36.0)
MCV: 97.2 fL (ref 80.0–100.0)
MONO ABS: 0.9 10*3/uL (ref 0.2–1.0)
Monocytes Relative: 15 %
Neutro Abs: 3.9 10*3/uL (ref 1.4–6.5)
Neutrophils Relative %: 65 %
PLATELETS: 196 10*3/uL (ref 150–440)
RBC: 3.53 MIL/uL — AB (ref 4.40–5.90)
RDW: 15.6 % — AB (ref 11.5–14.5)
WBC: 6 10*3/uL (ref 3.8–10.6)

## 2017-07-06 MED ORDER — HEPARIN SOD (PORK) LOCK FLUSH 100 UNIT/ML IV SOLN
500.0000 [IU] | Freq: Once | INTRAVENOUS | Status: AC | PRN
  Administered 2017-07-06: 500 [IU]
  Filled 2017-07-06: qty 5

## 2017-07-06 MED ORDER — SODIUM CHLORIDE 0.9 % IV SOLN
Freq: Once | INTRAVENOUS | Status: AC
  Administered 2017-07-06: 09:00:00 via INTRAVENOUS
  Filled 2017-07-06: qty 1000

## 2017-07-06 MED ORDER — SODIUM CHLORIDE 0.9 % IV SOLN
10.0000 mg/kg | Freq: Once | INTRAVENOUS | Status: AC
  Administered 2017-07-06: 860 mg via INTRAVENOUS
  Filled 2017-07-06: qty 7.2

## 2017-07-06 NOTE — Progress Notes (Signed)
Patient here for pre treatment check. He has gained 5 pounds since his last visit.

## 2017-07-12 DIAGNOSIS — R0602 Shortness of breath: Secondary | ICD-10-CM | POA: Diagnosis not present

## 2017-07-12 DIAGNOSIS — R079 Chest pain, unspecified: Secondary | ICD-10-CM | POA: Diagnosis not present

## 2017-07-20 ENCOUNTER — Encounter: Payer: Self-pay | Admitting: Oncology

## 2017-07-20 ENCOUNTER — Inpatient Hospital Stay: Payer: PPO

## 2017-07-20 ENCOUNTER — Inpatient Hospital Stay (HOSPITAL_BASED_OUTPATIENT_CLINIC_OR_DEPARTMENT_OTHER): Payer: PPO | Admitting: Oncology

## 2017-07-20 ENCOUNTER — Other Ambulatory Visit: Payer: Self-pay

## 2017-07-20 VITALS — BP 123/74 | HR 71 | Temp 96.6°F | Ht 73.0 in | Wt 186.4 lb

## 2017-07-20 DIAGNOSIS — Z5112 Encounter for antineoplastic immunotherapy: Secondary | ICD-10-CM | POA: Diagnosis not present

## 2017-07-20 DIAGNOSIS — N400 Enlarged prostate without lower urinary tract symptoms: Secondary | ICD-10-CM

## 2017-07-20 DIAGNOSIS — R05 Cough: Secondary | ICD-10-CM

## 2017-07-20 DIAGNOSIS — R21 Rash and other nonspecific skin eruption: Secondary | ICD-10-CM | POA: Diagnosis not present

## 2017-07-20 DIAGNOSIS — C770 Secondary and unspecified malignant neoplasm of lymph nodes of head, face and neck: Secondary | ICD-10-CM | POA: Diagnosis not present

## 2017-07-20 DIAGNOSIS — Z9221 Personal history of antineoplastic chemotherapy: Secondary | ICD-10-CM | POA: Diagnosis not present

## 2017-07-20 DIAGNOSIS — I10 Essential (primary) hypertension: Secondary | ICD-10-CM

## 2017-07-20 DIAGNOSIS — Z87891 Personal history of nicotine dependence: Secondary | ICD-10-CM

## 2017-07-20 DIAGNOSIS — C3412 Malignant neoplasm of upper lobe, left bronchus or lung: Secondary | ICD-10-CM

## 2017-07-20 DIAGNOSIS — M1990 Unspecified osteoarthritis, unspecified site: Secondary | ICD-10-CM | POA: Diagnosis not present

## 2017-07-20 DIAGNOSIS — Z79899 Other long term (current) drug therapy: Secondary | ICD-10-CM | POA: Diagnosis not present

## 2017-07-20 DIAGNOSIS — Z923 Personal history of irradiation: Secondary | ICD-10-CM | POA: Diagnosis not present

## 2017-07-20 DIAGNOSIS — F418 Other specified anxiety disorders: Secondary | ICD-10-CM

## 2017-07-20 DIAGNOSIS — Z7982 Long term (current) use of aspirin: Secondary | ICD-10-CM

## 2017-07-20 DIAGNOSIS — C3492 Malignant neoplasm of unspecified part of left bronchus or lung: Secondary | ICD-10-CM

## 2017-07-20 DIAGNOSIS — K219 Gastro-esophageal reflux disease without esophagitis: Secondary | ICD-10-CM

## 2017-07-20 DIAGNOSIS — Z86010 Personal history of colonic polyps: Secondary | ICD-10-CM

## 2017-07-20 DIAGNOSIS — Z803 Family history of malignant neoplasm of breast: Secondary | ICD-10-CM

## 2017-07-20 LAB — COMPREHENSIVE METABOLIC PANEL
ALK PHOS: 57 U/L (ref 38–126)
ALT: 17 U/L (ref 17–63)
ANION GAP: 8 (ref 5–15)
AST: 24 U/L (ref 15–41)
Albumin: 3.4 g/dL — ABNORMAL LOW (ref 3.5–5.0)
BILIRUBIN TOTAL: 0.5 mg/dL (ref 0.3–1.2)
BUN: 21 mg/dL — ABNORMAL HIGH (ref 6–20)
CALCIUM: 8.8 mg/dL — AB (ref 8.9–10.3)
CO2: 23 mmol/L (ref 22–32)
Chloride: 104 mmol/L (ref 101–111)
Creatinine, Ser: 1.22 mg/dL (ref 0.61–1.24)
GFR calc Af Amer: >60 mL/min (ref >60)
GFR, EST NON AFRICAN AMERICAN: 58 mL/min — AB (ref >60)
Glucose, Bld: 106 mg/dL — ABNORMAL HIGH (ref 65–99)
POTASSIUM: 3.7 mmol/L (ref 3.5–5.1)
Sodium: 135 mmol/L (ref 135–145)
TOTAL PROTEIN: 6.9 g/dL (ref 6.5–8.1)

## 2017-07-20 LAB — CBC WITH DIFFERENTIAL/PLATELET
Basophils Absolute: 0 10*3/uL (ref 0–0.1)
Basophils Relative: 1 %
Eosinophils Absolute: 0.5 10*3/uL (ref 0–0.7)
Eosinophils Relative: 10 %
HEMATOCRIT: 38.7 % — AB (ref 40.0–52.0)
Hemoglobin: 12.9 g/dL — ABNORMAL LOW (ref 13.0–18.0)
LYMPHS ABS: 0.8 10*3/uL — AB (ref 1.0–3.6)
LYMPHS PCT: 15 %
MCH: 32.2 pg (ref 26.0–34.0)
MCHC: 33.5 g/dL (ref 32.0–36.0)
MCV: 96.2 fL (ref 80.0–100.0)
MONO ABS: 0.5 10*3/uL (ref 0.2–1.0)
MONOS PCT: 9 %
NEUTROS ABS: 3.3 10*3/uL (ref 1.4–6.5)
Neutrophils Relative %: 65 %
Platelets: 225 10*3/uL (ref 150–440)
RBC: 4.02 MIL/uL — ABNORMAL LOW (ref 4.40–5.90)
RDW: 14.6 % — AB (ref 11.5–14.5)
WBC: 5.1 10*3/uL (ref 3.8–10.6)

## 2017-07-20 LAB — TSH: TSH: 34.394 u[IU]/mL — ABNORMAL HIGH (ref 0.350–4.500)

## 2017-07-20 MED ORDER — SODIUM CHLORIDE 0.9 % IV SOLN
10.0000 mg/kg | Freq: Once | INTRAVENOUS | Status: AC
  Administered 2017-07-20: 860 mg via INTRAVENOUS
  Filled 2017-07-20: qty 7.2

## 2017-07-20 MED ORDER — DIPHENHYDRAMINE-ZINC ACETATE 2-0.1 % EX CREA: 1.0000 "application " | TOPICAL_CREAM | Freq: Three times a day (TID) | CUTANEOUS | 0 refills | Status: DC | PRN

## 2017-07-20 MED ORDER — HEPARIN SOD (PORK) LOCK FLUSH 100 UNIT/ML IV SOLN
500.0000 [IU] | Freq: Once | INTRAVENOUS | Status: AC | PRN
  Administered 2017-07-20: 500 [IU]

## 2017-07-20 MED ORDER — SODIUM CHLORIDE 0.9 % IV SOLN
Freq: Once | INTRAVENOUS | Status: AC
  Administered 2017-07-20: 10:00:00 via INTRAVENOUS
  Filled 2017-07-20: qty 1000

## 2017-07-20 MED ORDER — TRIAMCINOLONE ACETONIDE 0.5 % EX OINT: 1.0000 "application " | TOPICAL_OINTMENT | Freq: Three times a day (TID) | CUTANEOUS | 0 refills | Status: DC | PRN

## 2017-07-20 NOTE — Progress Notes (Signed)
Hematology/Oncology Follow Up Note [facility] [phone] Fax:(336) [phone]   Patient Care Team: [name], MD as PCP - General (Family Medicine) [name], [name], MD (General Surgery) [name], MD as Consulting Physician (Oncology) [name], RN as Registered Nurse  Reason for Visit: follow up for treatment of lung cancer  HISTORY OF PRESENTING ILLNESS:  Marc Gray 72 y.o.  male who presents for treatment of Stage IIIB (cT2b, cN3, cM0)  Adenocarcinoma of lung.  # Patient is s/p [place] and taxol concurrent RT for treatment of Stage IIIB Lung cancer. He recieved additional lung boost RT Currently on Durvalumab maintenance.   # Colon CA was listed in his history, however reviewing his surgical pathology from colonoscopy on 04/15/2014, during which he had polyps removed and all are negative for cancer.patient is scheduled for colonoscopy screening this year. .   INTERVAL HISTORY Patient he presents for evaluation prior to immunotherapy.  Patient tolerates immunotherapy well.  Denies any syncope, dizziness, chest pain, diarrhea. He develeoped rash on back, posterior shoulder, ichty. He uses oat milk cream.    Review of Systems  Constitutional: Negative for appetite change, chills, fatigue, fever and unexpected weight change.  HENT:   Negative for hearing loss, lump/mass and mouth sores.   Eyes: Negative for eye problems and icterus.  Respiratory: Positive for cough. Negative for chest tightness.        SOB with exertion.   Cardiovascular: Negative for chest pain and leg swelling.  Gastrointestinal: Negative for abdominal distention, abdominal pain and diarrhea.  Endocrine: Negative for hot flashes.  Genitourinary: Negative for difficulty urinating, dysuria and frequency.   Musculoskeletal: Negative for arthralgias and flank pain.  Skin: Positive for itching and rash.  Neurological: Negative for dizziness, headaches,  light-headedness and seizures.  Hematological: Negative for adenopathy.  Psychiatric/Behavioral: Negative for confusion and decreased concentration. The patient is not nervous/anxious.     MEDICAL HISTORY:  Past Medical History:  Diagnosis Date  . Anxiety   . Arthritis    SHOULDER  . Cataract   . Colon cancer ([place]) 2015   Pt states during his colonoscopy he had cancerous polyps removed.   . Depression    SINCE DIAGNOSIS  . Dyspnea    DOE  . Enlarged prostate   . Hypertension   . Lung cancer [facility])    Aug 2018  . Pain    DUE TO LUNG ISSUE    SURGICAL HISTORY: Past Surgical History:  Procedure Laterality Date  . CATARACT EXTRACTION W/ INTRAOCULAR LENS  IMPLANT, BILATERAL    . COLONOSCOPY    . EYE SURGERY    . LYMPH GLAND EXCISION N/A 02/09/2017   Procedure: CERVICAL LYMPH NODE BIOPSY;  Surgeon: Robert Bellow, MD;  Location: [facility];  Service: General;  Laterality: N/A;  . PORTACATH PLACEMENT Right 02/23/2017   Procedure: INSERTION PORT-A-CATH;  Surgeon: Robert Bellow, MD;  Location: [facility];  Service: General;  Laterality: Right;  . PROSTATE SURGERY    . TONSILLECTOMY      SOCIAL HISTORY: Social History   Socioeconomic History  . Marital status: Married    Spouse name: Not on file  . Number of children: Not on file  . Years of education: Not on file  . Highest education level: Not on file  Social Needs  . Financial resource strain: Not on file  . Food insecurity - worry: Not on file  . Food insecurity - inability: Not on file  . Transportation  needs - medical: Not on file  . Transportation needs - non-medical: Not on file  Occupational History  . Not on file  Tobacco Use  . Smoking status: Former Smoker    Packs/day: 1.00    Years: 53.00    Pack years: 53.00    Types: Cigarettes    Last attempt to quit: 04/14/2017    Years since quitting: 0.2  . Smokeless tobacco: Never Used  . Tobacco comment: Quit November 2018  Substance and Sexual Activity    . Alcohol use: Yes    Comment: occassional  . Drug use: No  . Sexual activity: Yes  Other Topics Concern  . Not on file  Social History Narrative  . Not on file    FAMILY HISTORY: Family History  Problem Relation Age of Onset  . Breast cancer Maternal Grandmother   . Dementia Mother   . Diabetes Father   . Stroke Father     ALLERGIES:  has No Known Allergies.  MEDICATIONS:  Current Outpatient Medications  Medication Sig Dispense Refill  . aspirin 325 MG tablet Take 650 mg by mouth daily.     . Chlorpheniramine-Phenylephrine (SINUS & ALLERGY) 4-10 MG tablet Take 1 tablet by mouth daily as needed.     . lidocaine-prilocaine (EMLA) cream Apply over port 1-2 hours prior to chemotherapy treatment.  Cover with plastic wrap. 30 g 1  . morphine 20 MG/5ML solution Take 2.5 mLs (10 mg total) by mouth every 6 (six) hours as needed for pain (before meal). 100 mL 0  . Multiple Vitamin (MULTI-VITAMINS) TABS Take 1 tablet by mouth daily.     . Nicotine 21-14-7 MG/24HR KIT Place 1 patch daily onto the skin. 1 each 0  . nystatin (MYCOSTATIN) 100000 UNIT/ML suspension Take 5 mLs (500,000 Units total) 4 (four) times daily by mouth. Swish and swallow 473 mL 0  . ondansetron (ZOFRAN) 8 MG tablet Take 1 tablet (8 mg total) by mouth every 8 (eight) hours as needed for nausea or vomiting. 60 tablet 1  . pantoprazole (PROTONIX) 40 MG tablet Take 1 tablet (40 mg total) by mouth daily. 30 tablet 0  . prochlorperazine (COMPAZINE) 10 MG tablet Take 1 tablet (10 mg total) by mouth every 6 (six) hours as needed for nausea or vomiting. 60 tablet 2  . sucralfate (CARAFATE) 1 g tablet Take 1 tablet (1 g total) by mouth 3 (three) times daily. Dissolve in 2-3 tbsp warm water, swish and swallow 90 tablet 3   No current facility-administered medications for this visit.    Facility-Administered Medications Ordered in Other Visits  Medication Dose Route Frequency Provider Last Rate Last Dose  . sodium chloride  flush (NS) 0.9 % injection 10 mL  10 mL Intravenous PRN [name], MD   10 mL at 04/03/17 7353      .  PHYSICAL EXAMINATION: ECOG PERFORMANCE STATUS: 1 - Symptomatic but completely ambulatory Vitals:   07/20/17 0906  BP: 123/74  Pulse: 71  Temp: (!) 96.6 F (35.9 C)  SpO2: 98%   Filed Weights   07/20/17 0906  Weight: 186 lb 6 oz (84.5 kg)  Physical Exam  Constitutional: He is oriented to person, place, and time. No distress.  HENT:  Head: Normocephalic and atraumatic.  Mouth/Throat: Oropharynx is clear and moist. No oropharyngeal exudate.  Eyes: Conjunctivae and EOM are normal. Pupils are equal, round, and reactive to light. No scleral icterus.  Neck: Normal range of motion. Neck supple.  Cardiovascular: Normal rate, regular  rhythm and normal heart sounds.  No murmur heard. Pulmonary/Chest: Effort normal and breath sounds normal. No stridor. He has no wheezes. He has no rales.  Decreased sound bilaterally lower lobe.   Abdominal: Soft. Bowel sounds are normal. He exhibits no distension and no mass. There is no tenderness. There is no rebound.  Musculoskeletal: Normal range of motion. He exhibits no edema or tenderness.  Lymphadenopathy:    He has no cervical adenopathy.  Neurological: He is alert and oriented to person, place, and time. No cranial nerve deficit. Coordination normal.  Skin: Skin is warm and dry.  Scattered erythematous rash on back, mostly concentrated posterior right shoulder.   Psychiatric: Affect and judgment normal.   LABORATORY DATA:  I have reviewed the data as listed Lab Results  Component Value Date   WBC 6.0 07/06/2017   HGB 11.6 (L) 07/06/2017   HCT 34.3 (L) 07/06/2017   MCV 97.2 07/06/2017   PLT 196 07/06/2017   Recent Labs    06/07/17 0848 06/22/17 1036 07/06/17 0820  NA 135 133* 134*  K 4.2 4.2 4.2  CL 101 101 102  CO2 26 26 26   GLUCOSE 103* 100* 101*  BUN 26* 32* 26*  CREATININE 1.28* 1.27* 1.08  CALCIUM 8.9 8.8* 8.7*    GFRNONAA 55* 56* >60  GFRAA >60 >60 >60  PROT 6.8 6.6 6.5  ALBUMIN 3.3* 3.3* 3.3*  AST 20 19 24   ALT 16* 16* 20  ALKPHOS 48 53 54  BILITOT 0.5 0.3 0.4   TSH 3.152  ASSESSMENT & PLAN:  Cancer Staging Adenocarcinoma of left lung, stage 3 (HCC) Staging form: Lung, AJCC 8th Edition - Clinical stage from 02/14/2017: Stage IIIB (cT2b, cN3, cM0) - Signed by [name], MD on 02/14/2017  1. Adenocarcinoma of left lung, stage 3 ([place])   2. Encounter for antineoplastic immunotherapy   3. Gastroesophageal reflux disease, esophagitis presence not specified   4. Skin rash    Tolerates immunotherapy well. Will proceed with Durvalumab today.  #Continue Protonix daily. # Grade 1 Rash, likely due to immunotherapy. Start Triamcinlone 0.5% ointment topical TID PRN, and also Benadryl cream PRN.   Return visit: Follow up in one week for assessment of rash and  2 weeks prior to next cycles of Durvalumab.  All questions were answered. The patient knows to call the clinic with any problems questions or concerns.   [name], MD, PhD Hematology Oncology [facility] at [facility] Pager- [phone] 07/20/2017

## 2017-07-20 NOTE — Progress Notes (Signed)
Patient here today for follow up. Patient c/o itching, mostly on his back

## 2017-07-23 ENCOUNTER — Telehealth: Payer: Self-pay

## 2017-07-23 ENCOUNTER — Other Ambulatory Visit: Payer: Self-pay | Admitting: Oncology

## 2017-07-23 DIAGNOSIS — R7989 Other specified abnormal findings of blood chemistry: Secondary | ICD-10-CM

## 2017-07-23 NOTE — Telephone Encounter (Signed)
Nutrition Follow-up:  Patient with lung cancer, followed by Dr. [name].  Patient continues with immunotherapy.    Spoke with patient via phone today for nutrition follow-up. Patient reports that appetite is good and he has been eating well.  Reports that he has switched back to lower calorie boost shake due to weight gain.  Drinks them 1-2 per day.  Reports that he typically eats egg, bacon and grits for breakfast. Lunch usually snacks on something sweet or peanut butter crackers or cheese and crackers.  For supper has rice or potato, vegetable and meat.  Likes ice cream.  Reports radiation esophagitis has improved.  No problems with nausea or changes in bowel regimen.    Medications: reviewed  Labs: reviewed  Anthropometrics:   Weight has increased to 186 lb 6 oz noted on 2/22 from 179 lb on 1/10.     NUTRITION DIAGNOSIS: Inadequate oral intake resolved with weight gain   INTERVENTION:   Encouraged patient to continue with good nutrition including good sources of protein at every meal.   With continued weight gain may not need oral nutrition supplements    MONITORING, EVALUATION, GOAL: Patient will consume adequate calories and protein to maintain weight   NEXT VISIT: as needed, patient to contact RD  Munira Polson B. Zenia Resides, [place], [place] Registered Dietitian [phone] (pager)

## 2017-07-27 ENCOUNTER — Inpatient Hospital Stay: Payer: PPO

## 2017-07-27 ENCOUNTER — Inpatient Hospital Stay: Payer: PPO | Attending: Oncology | Admitting: Oncology

## 2017-07-27 ENCOUNTER — Encounter: Payer: Self-pay | Admitting: Oncology

## 2017-07-27 VITALS — BP 124/78 | HR 62 | Temp 96.9°F | Resp 18 | Ht 73.0 in | Wt 190.6 lb

## 2017-07-27 DIAGNOSIS — Z9221 Personal history of antineoplastic chemotherapy: Secondary | ICD-10-CM

## 2017-07-27 DIAGNOSIS — E032 Hypothyroidism due to medicaments and other exogenous substances: Secondary | ICD-10-CM | POA: Insufficient documentation

## 2017-07-27 DIAGNOSIS — Z79899 Other long term (current) drug therapy: Secondary | ICD-10-CM | POA: Diagnosis not present

## 2017-07-27 DIAGNOSIS — Z86010 Personal history of colonic polyps: Secondary | ICD-10-CM | POA: Insufficient documentation

## 2017-07-27 DIAGNOSIS — Z923 Personal history of irradiation: Secondary | ICD-10-CM

## 2017-07-27 DIAGNOSIS — R0609 Other forms of dyspnea: Secondary | ICD-10-CM

## 2017-07-27 DIAGNOSIS — H0102A Squamous blepharitis right eye, upper and lower eyelids: Secondary | ICD-10-CM | POA: Diagnosis not present

## 2017-07-27 DIAGNOSIS — I10 Essential (primary) hypertension: Secondary | ICD-10-CM | POA: Insufficient documentation

## 2017-07-27 DIAGNOSIS — R21 Rash and other nonspecific skin eruption: Secondary | ICD-10-CM | POA: Insufficient documentation

## 2017-07-27 DIAGNOSIS — Z803 Family history of malignant neoplasm of breast: Secondary | ICD-10-CM

## 2017-07-27 DIAGNOSIS — C3412 Malignant neoplasm of upper lobe, left bronchus or lung: Secondary | ICD-10-CM | POA: Insufficient documentation

## 2017-07-27 DIAGNOSIS — R5383 Other fatigue: Secondary | ICD-10-CM | POA: Insufficient documentation

## 2017-07-27 DIAGNOSIS — H579 Unspecified disorder of eye and adnexa: Secondary | ICD-10-CM | POA: Insufficient documentation

## 2017-07-27 DIAGNOSIS — Z5112 Encounter for antineoplastic immunotherapy: Secondary | ICD-10-CM | POA: Insufficient documentation

## 2017-07-27 DIAGNOSIS — R0981 Nasal congestion: Secondary | ICD-10-CM | POA: Insufficient documentation

## 2017-07-27 DIAGNOSIS — R7989 Other specified abnormal findings of blood chemistry: Secondary | ICD-10-CM

## 2017-07-27 DIAGNOSIS — F418 Other specified anxiety disorders: Secondary | ICD-10-CM

## 2017-07-27 DIAGNOSIS — R946 Abnormal results of thyroid function studies: Secondary | ICD-10-CM | POA: Diagnosis not present

## 2017-07-27 DIAGNOSIS — M1990 Unspecified osteoarthritis, unspecified site: Secondary | ICD-10-CM | POA: Diagnosis not present

## 2017-07-27 DIAGNOSIS — Z87891 Personal history of nicotine dependence: Secondary | ICD-10-CM | POA: Diagnosis not present

## 2017-07-27 DIAGNOSIS — C770 Secondary and unspecified malignant neoplasm of lymph nodes of head, face and neck: Secondary | ICD-10-CM

## 2017-07-27 DIAGNOSIS — Z7982 Long term (current) use of aspirin: Secondary | ICD-10-CM | POA: Diagnosis not present

## 2017-07-27 DIAGNOSIS — C3492 Malignant neoplasm of unspecified part of left bronchus or lung: Secondary | ICD-10-CM

## 2017-07-27 DIAGNOSIS — K219 Gastro-esophageal reflux disease without esophagitis: Secondary | ICD-10-CM | POA: Diagnosis not present

## 2017-07-27 DIAGNOSIS — N400 Enlarged prostate without lower urinary tract symptoms: Secondary | ICD-10-CM | POA: Diagnosis not present

## 2017-07-27 LAB — T4, FREE: Free T4: 0.37 ng/dL — ABNORMAL LOW (ref 0.61–1.12)

## 2017-07-27 NOTE — Progress Notes (Signed)
Patient c/o bilateral eye redness with ( yellowish) drainage x 3 weeks.

## 2017-07-27 NOTE — Progress Notes (Addendum)
Hematology/Oncology Follow Up Note [facility] Telephone:([phone] Fax:([phone]   Patient Care Team: [name], MD as PCP - General (Family Medicine) [name], [name], MD (General Surgery) [name], MD as Consulting Physician (Oncology) [name], RN as Registered Nurse  Reason for Visit: follow up for treatment of lung cancer  HISTORY OF PRESENTING ILLNESS:  Marc Gray 72 y.o.  male who presents for treatment of Stage IIIB (cT2b, cN3, cM0)  Adenocarcinoma of lung.  # Patient is s/p [place] and taxol concurrent RT for treatment of Stage IIIB Lung cancer. He recieved additional lung boost RT Currently on Durvalumab maintenance.   # Colon CA was listed in his history, however reviewing his surgical pathology from colonoscopy on 04/15/2014, during which he had polyps removed and all are negative for cancer.patient is scheduled for colonoscopy screening this year. .   INTERVAL HISTORY Patient he presents for evaluation prior to immunotherapy.  Patient tolerates immunotherapy well, except developing rash last week. Today presents to reassess rash. He uses topical steroid cream and rash has improved.  Denies any syncope, dizziness, chest pain, diarrhea. He also has had eye irritation for the past 3 weeks.   Review of Systems  Constitutional: Negative for appetite change, chills, fatigue, fever and unexpected weight change.  HENT:   Negative for hearing loss, lump/mass, mouth sores and nosebleeds.   Eyes: Positive for eye problems. Negative for icterus.       Eye irritation  Respiratory: Negative for chest tightness and cough.        SOB with exertion.   Cardiovascular: Negative for chest pain and leg swelling.  Gastrointestinal: Negative for abdominal distention, abdominal pain, blood in stool, constipation and diarrhea.  Endocrine: Negative for hot flashes.  Genitourinary: Negative for difficulty urinating, dysuria, frequency  and hematuria.   Musculoskeletal: Negative for arthralgias, flank pain and myalgias.  Skin: Positive for itching and rash.  Neurological: Negative for dizziness, headaches, light-headedness and seizures.  Hematological: Negative for adenopathy.  Psychiatric/Behavioral: Negative for confusion and decreased concentration. The patient is not nervous/anxious.     MEDICAL HISTORY:  Past Medical History:  Diagnosis Date  . Anxiety   . Arthritis    SHOULDER  . Cataract   . Colon cancer ([place]) 2015   Pt states during his colonoscopy he had cancerous polyps removed.   . Depression    SINCE DIAGNOSIS  . Dyspnea    DOE  . Enlarged prostate   . Hypertension   . Lung cancer [facility])    Aug 2018  . Pain    DUE TO LUNG ISSUE    SURGICAL HISTORY: Past Surgical History:  Procedure Laterality Date  . CATARACT EXTRACTION W/ INTRAOCULAR LENS  IMPLANT, BILATERAL    . COLONOSCOPY    . EYE SURGERY    . LYMPH GLAND EXCISION N/A 02/09/2017   Procedure: CERVICAL LYMPH NODE BIOPSY;  Surgeon: Robert Bellow, MD;  Location: [facility];  Service: General;  Laterality: N/A;  . PORTACATH PLACEMENT Right 02/23/2017   Procedure: INSERTION PORT-A-CATH;  Surgeon: Robert Bellow, MD;  Location: [facility];  Service: General;  Laterality: Right;  . PROSTATE SURGERY    . TONSILLECTOMY      SOCIAL HISTORY: Social History   Socioeconomic History  . Marital status: Married    Spouse name: Not on file  . Number of children: Not on file  . Years of education: Not on file  . Highest education level: Not on file  Social Needs  . Financial resource strain: Not on file  . Food insecurity - worry: Not on file  . Food insecurity - inability: Not on file  . Transportation needs - medical: Not on file  . Transportation needs - non-medical: Not on file  Occupational History  . Not on file  Tobacco Use  . Smoking status: Former Smoker    Packs/day: 1.00    Years: 53.00    Pack years: 53.00    Types:  Cigarettes    Last attempt to quit: 04/14/2017    Years since quitting: 0.2  . Smokeless tobacco: Never Used  . Tobacco comment: Quit November 2018  Substance and Sexual Activity  . Alcohol use: Yes    Comment: occassional  . Drug use: No  . Sexual activity: Yes  Other Topics Concern  . Not on file  Social History Narrative  . Not on file    FAMILY HISTORY: Family History  Problem Relation Age of Onset  . Breast cancer Maternal Grandmother   . Dementia Mother   . Diabetes Father   . Stroke Father     ALLERGIES:  has No Known Allergies.  MEDICATIONS:  Current Outpatient Medications  Medication Sig Dispense Refill  . aspirin 325 MG tablet Take 650 mg by mouth daily.     . Chlorpheniramine-Phenylephrine (SINUS & ALLERGY) 4-10 MG tablet Take 1 tablet by mouth daily as needed.     . diphenhydrAMINE-zinc acetate (BENADRYL EXTRA STRENGTH) cream Apply 1 application topically 3 (three) times daily as needed for itching. 28 g 0  . lidocaine-prilocaine (EMLA) cream Apply over port 1-2 hours prior to chemotherapy treatment.  Cover with plastic wrap. 30 g 1  . morphine 20 MG/5ML solution Take 2.5 mLs (10 mg total) by mouth every 6 (six) hours as needed for pain (before meal). 100 mL 0  . Multiple Vitamin (MULTI-VITAMINS) TABS Take 1 tablet by mouth daily.     . Nicotine 21-14-7 MG/24HR KIT Place 1 patch daily onto the skin. 1 each 0  . nystatin (MYCOSTATIN) 100000 UNIT/ML suspension Take 5 mLs (500,000 Units total) 4 (four) times daily by mouth. Swish and swallow 473 mL 0  . ondansetron (ZOFRAN) 8 MG tablet Take 1 tablet (8 mg total) by mouth every 8 (eight) hours as needed for nausea or vomiting. 60 tablet 1  . pantoprazole (PROTONIX) 40 MG tablet Take 1 tablet (40 mg total) by mouth daily. 30 tablet 0  . prochlorperazine (COMPAZINE) 10 MG tablet Take 1 tablet (10 mg total) by mouth every 6 (six) hours as needed for nausea or vomiting. 60 tablet 2  . sucralfate (CARAFATE) 1 g tablet Take  1 tablet (1 g total) by mouth 3 (three) times daily. Dissolve in 2-3 tbsp warm water, swish and swallow 90 tablet 3  . triamcinolone ointment (KENALOG) 0.5 % Apply 1 application topically 3 (three) times daily as needed. 30 g 0   No current facility-administered medications for this visit.    Facility-Administered Medications Ordered in Other Visits  Medication Dose Route Frequency Provider Last Rate Last Dose  . sodium chloride flush (NS) 0.9 % injection 10 mL  10 mL Intravenous PRN [name], MD   10 mL at 04/03/17 5974      .  PHYSICAL EXAMINATION: ECOG PERFORMANCE STATUS: 1 - Symptomatic but completely ambulatory Vitals:   07/27/17 1049  BP: 124/78  Pulse: 62  Resp: 18  Temp: (!) 96.9 F (36.1 C)  SpO2: 97%   Filed  Weights   07/27/17 1049  Weight: 190 lb 9.6 oz (86.5 kg)  Physical Exam  Constitutional: He is oriented to person, place, and time. No distress.  HENT:  Head: Normocephalic and atraumatic.  Mouth/Throat: Oropharynx is clear and moist. No oropharyngeal exudate.  Eyes: Conjunctivae and EOM are normal. Pupils are equal, round, and reactive to light. No scleral icterus.  Neck: Normal range of motion. Neck supple.  Cardiovascular: Normal rate, regular rhythm and normal heart sounds.  No murmur heard. Pulmonary/Chest: Effort normal and breath sounds normal. No stridor. He has no wheezes. He has no rales.  Decreased sound bilaterally lower lobe.   Abdominal: Soft. Bowel sounds are normal. He exhibits no distension and no mass. There is no tenderness. There is no rebound.  Musculoskeletal: Normal range of motion. He exhibits no edema or tenderness.  Lymphadenopathy:    He has no cervical adenopathy.  Neurological: He is alert and oriented to person, place, and time. No cranial nerve deficit. Coordination normal.  Skin: Skin is warm and dry.  Scattered erythematous rash on back, mostly concentrated posterior right shoulder, improved.   Psychiatric: Affect and  judgment normal.   LABORATORY DATA:  I have reviewed the data as listed Lab Results  Component Value Date   WBC 5.1 07/20/2017   HGB 12.9 (L) 07/20/2017   HCT 38.7 (L) 07/20/2017   MCV 96.2 07/20/2017   PLT 225 07/20/2017   Recent Labs    06/22/17 1036 07/06/17 0820 07/20/17 0833  NA 133* 134* 135  K 4.2 4.2 3.7  CL 101 102 104  CO2 _0 GLUCOSE 100* 101* 106*  BUN 32* 26* 21*  CREATININE 1.27* 1.08 1.22  CALCIUM 8.8* 8.7* 8.8*  GFRNONAA 56* >60 58*  GFRAA >60 >60 >60  PROT 6.6 6.5 6.9  ALBUMIN 3.3* 3.3* 3.4*  AST _1 ALT 16* 20 17  ALKPHOS 53 54 57  BILITOT 0.3 0.4 0.5   TSH 3.152  ASSESSMENT & PLAN:  Cancer Staging Adenocarcinoma of left lung, stage 3 (HCC) Staging form: Lung, AJCC 8th Edition - Clinical stage from 02/14/2017: Stage IIIB (cT2b, cN3, cM0) - Signed by [name], MD on 02/14/2017  1. Adenocarcinoma of left lung, stage 3 ([place])   2. Encounter for antineoplastic immunotherapy    Tolerates immunotherapy well.  #Continue Protonix daily. # Grade 1 Rash, likely due to immunotherapy. Improved with Triamcinlone 0.5% ointment topical TID PRN, and also Benadryl cream PRN.  # Elevated TSH, check T3, and T4. Free T4 decreased. Consider starting low dose synthroid at next visit. .  # Eye irritation: suggest patient to obtain ophthalmology evaluation to rule out infection. Possible side effects, ?steroids eye drops?  Return visit: Follow up in one week prior to next cycles of Durvalumab.  All questions were answered. The patient knows to call the clinic with any problems questions or concerns.   [name], MD, PhD Hematology Oncology [facility] Pager- [phone] 07/27/2017

## 2017-07-28 LAB — T3: T3 TOTAL: 48 ng/dL — AB (ref 71–180)

## 2017-08-03 ENCOUNTER — Other Ambulatory Visit: Payer: Self-pay

## 2017-08-03 ENCOUNTER — Encounter: Payer: Self-pay | Admitting: Oncology

## 2017-08-03 ENCOUNTER — Inpatient Hospital Stay: Payer: PPO

## 2017-08-03 ENCOUNTER — Inpatient Hospital Stay (HOSPITAL_BASED_OUTPATIENT_CLINIC_OR_DEPARTMENT_OTHER): Payer: PPO | Admitting: Oncology

## 2017-08-03 VITALS — BP 144/83 | HR 55 | Temp 96.8°F | Wt 195.4 lb

## 2017-08-03 DIAGNOSIS — R21 Rash and other nonspecific skin eruption: Secondary | ICD-10-CM

## 2017-08-03 DIAGNOSIS — R946 Abnormal results of thyroid function studies: Secondary | ICD-10-CM | POA: Diagnosis not present

## 2017-08-03 DIAGNOSIS — Z7982 Long term (current) use of aspirin: Secondary | ICD-10-CM | POA: Diagnosis not present

## 2017-08-03 DIAGNOSIS — K219 Gastro-esophageal reflux disease without esophagitis: Secondary | ICD-10-CM

## 2017-08-03 DIAGNOSIS — C3492 Malignant neoplasm of unspecified part of left bronchus or lung: Secondary | ICD-10-CM

## 2017-08-03 DIAGNOSIS — Z79899 Other long term (current) drug therapy: Secondary | ICD-10-CM

## 2017-08-03 DIAGNOSIS — Z5112 Encounter for antineoplastic immunotherapy: Secondary | ICD-10-CM

## 2017-08-03 DIAGNOSIS — Z803 Family history of malignant neoplasm of breast: Secondary | ICD-10-CM

## 2017-08-03 DIAGNOSIS — R5383 Other fatigue: Secondary | ICD-10-CM | POA: Diagnosis not present

## 2017-08-03 DIAGNOSIS — C3412 Malignant neoplasm of upper lobe, left bronchus or lung: Secondary | ICD-10-CM

## 2017-08-03 DIAGNOSIS — Z86010 Personal history of colonic polyps: Secondary | ICD-10-CM

## 2017-08-03 DIAGNOSIS — M1990 Unspecified osteoarthritis, unspecified site: Secondary | ICD-10-CM | POA: Diagnosis not present

## 2017-08-03 DIAGNOSIS — I10 Essential (primary) hypertension: Secondary | ICD-10-CM | POA: Diagnosis not present

## 2017-08-03 DIAGNOSIS — Z9221 Personal history of antineoplastic chemotherapy: Secondary | ICD-10-CM

## 2017-08-03 DIAGNOSIS — N400 Enlarged prostate without lower urinary tract symptoms: Secondary | ICD-10-CM

## 2017-08-03 DIAGNOSIS — H579 Unspecified disorder of eye and adnexa: Secondary | ICD-10-CM

## 2017-08-03 DIAGNOSIS — Z87891 Personal history of nicotine dependence: Secondary | ICD-10-CM

## 2017-08-03 DIAGNOSIS — C770 Secondary and unspecified malignant neoplasm of lymph nodes of head, face and neck: Secondary | ICD-10-CM | POA: Diagnosis not present

## 2017-08-03 DIAGNOSIS — F418 Other specified anxiety disorders: Secondary | ICD-10-CM | POA: Diagnosis not present

## 2017-08-03 DIAGNOSIS — Z923 Personal history of irradiation: Secondary | ICD-10-CM

## 2017-08-03 LAB — COMPREHENSIVE METABOLIC PANEL
ALBUMIN: 3.4 g/dL — AB (ref 3.5–5.0)
ALK PHOS: 56 U/L (ref 38–126)
ALT: 35 U/L (ref 17–63)
ANION GAP: 5 (ref 5–15)
AST: 39 U/L (ref 15–41)
BILIRUBIN TOTAL: 0.6 mg/dL (ref 0.3–1.2)
BUN: 27 mg/dL — ABNORMAL HIGH (ref 6–20)
CALCIUM: 8.8 mg/dL — AB (ref 8.9–10.3)
CO2: 28 mmol/L (ref 22–32)
Chloride: 99 mmol/L — ABNORMAL LOW (ref 101–111)
Creatinine, Ser: 1.31 mg/dL — ABNORMAL HIGH (ref 0.61–1.24)
GFR calc Af Amer: >60 mL/min (ref >60)
GFR, EST NON AFRICAN AMERICAN: 54 mL/min — AB (ref >60)
Glucose, Bld: 94 mg/dL (ref 65–99)
Potassium: 4.5 mmol/L (ref 3.5–5.1)
Sodium: 132 mmol/L — ABNORMAL LOW (ref 135–145)
TOTAL PROTEIN: 6.8 g/dL (ref 6.5–8.1)

## 2017-08-03 LAB — CBC WITH DIFFERENTIAL/PLATELET
BASOS ABS: 0.1 10*3/uL (ref 0–0.1)
BASOS PCT: 1 %
EOS PCT: 8 %
Eosinophils Absolute: 0.4 10*3/uL (ref 0–0.7)
HEMATOCRIT: 37 % — AB (ref 40.0–52.0)
Hemoglobin: 12.7 g/dL — ABNORMAL LOW (ref 13.0–18.0)
Lymphocytes Relative: 14 %
Lymphs Abs: 0.8 10*3/uL — ABNORMAL LOW (ref 1.0–3.6)
MCH: 32.4 pg (ref 26.0–34.0)
MCHC: 34.2 g/dL (ref 32.0–36.0)
MCV: 94.7 fL (ref 80.0–100.0)
Monocytes Absolute: 0.6 10*3/uL (ref 0.2–1.0)
Monocytes Relative: 11 %
NEUTROS ABS: 3.6 10*3/uL (ref 1.4–6.5)
Neutrophils Relative %: 66 %
PLATELETS: 175 10*3/uL (ref 150–440)
RBC: 3.91 MIL/uL — ABNORMAL LOW (ref 4.40–5.90)
RDW: 14.5 % (ref 11.5–14.5)
WBC: 5.5 10*3/uL (ref 3.8–10.6)

## 2017-08-03 MED ORDER — SODIUM CHLORIDE 0.9 % IV SOLN
Freq: Once | INTRAVENOUS | Status: AC
  Administered 2017-08-03: 10:00:00 via INTRAVENOUS
  Filled 2017-08-03: qty 1000

## 2017-08-03 MED ORDER — PANTOPRAZOLE SODIUM 40 MG PO TBEC: 40.0000 mg | DELAYED_RELEASE_TABLET | Freq: Every day | ORAL | 2 refills | Status: DC

## 2017-08-03 MED ORDER — LEVOTHYROXINE SODIUM 25 MCG PO TABS: 12.5000 ug | ORAL_TABLET | Freq: Every day | ORAL | 0 refills | Status: DC

## 2017-08-03 MED ORDER — SODIUM CHLORIDE 0.9% FLUSH
10.0000 mL | INTRAVENOUS | Status: DC | PRN
  Administered 2017-08-03: 10 mL via INTRAVENOUS
  Filled 2017-08-03: qty 10

## 2017-08-03 MED ORDER — HEPARIN SOD (PORK) LOCK FLUSH 100 UNIT/ML IV SOLN
500.0000 [IU] | Freq: Once | INTRAVENOUS | Status: AC
  Administered 2017-08-03: 500 [IU] via INTRAVENOUS

## 2017-08-03 MED ORDER — SODIUM CHLORIDE 0.9 % IV SOLN
10.0000 mg/kg | Freq: Once | INTRAVENOUS | Status: AC
  Administered 2017-08-03: 860 mg via INTRAVENOUS
  Filled 2017-08-03: qty 10

## 2017-08-03 MED ORDER — SODIUM CHLORIDE 0.9% FLUSH
10.0000 mL | INTRAVENOUS | Status: DC | PRN
  Filled 2017-08-03: qty 10

## 2017-08-03 MED ORDER — HEPARIN SOD (PORK) LOCK FLUSH 100 UNIT/ML IV SOLN: 500.0000 [IU] | Freq: Once | INTRAVENOUS | Status: DC | PRN

## 2017-08-03 NOTE — Progress Notes (Signed)
Hematology/Oncology Follow Up Note [facility] [phone] [phone]   Patient Care Team: [name], MD as PCP - General (Family Medicine) [name], [name], MD (General Surgery) [name], MD as Consulting Physician (Oncology) [name], RN as Registered Nurse  Reason for Visit: follow up for treatment of lung cancer  HISTORY OF PRESENTING ILLNESS:  Marc Gray 72 y.o.  male who presents for treatment of Stage IIIB (cT2b, cN3, cM0)  Adenocarcinoma of lung.  # Patient is s/p [place] and taxol concurrent RT for treatment of Stage IIIB Lung cancer. He recieved additional lung boost RT Currently on Durvalumab maintenance.   # Colon CA was listed in his history, however reviewing his surgical pathology from colonoscopy on 04/15/2014, during which he had polyps removed and all are negative for cancer.patient is scheduled for colonoscopy screening this year. .   INTERVAL HISTORY Patient he presents for evaluation prior to immunotherapy.  Patient tolerates immunotherapy well, except developing rash 2 weeks ago. He was given topical steroid cream and rash has improved. [name] He also has had eye irritation for the past couple of weeks and ophthalmology evaluation was obtained and patient has eye infection, he has been prescribed with antibiotic eye drops.  Denies any cough, chest pain, abdominal pain, diarrhea. Feel fatigue.   Review of Systems  Constitutional: Positive for fatigue. Negative for appetite change, chills, fever and unexpected weight change.  HENT:   Negative for hearing loss, lump/mass, mouth sores, nosebleeds and sore throat.   Eyes: Positive for eye problems. Negative for icterus.       Eye irritation  Respiratory: Negative for chest tightness and cough.        SOB with exertion.   Cardiovascular: Negative for chest pain and leg swelling.  Gastrointestinal: Negative for abdominal distention, abdominal pain, blood  in stool, constipation, diarrhea, nausea and vomiting.  Endocrine: Negative for hot flashes.  Genitourinary: Negative for difficulty urinating, dysuria, frequency and hematuria.   Musculoskeletal: Negative for arthralgias, flank pain and myalgias.  Skin: Negative for itching and rash.  Neurological: Negative for dizziness, headaches, light-headedness and seizures.  Hematological: Negative for adenopathy.  Psychiatric/Behavioral: Negative for confusion, decreased concentration and depression. The patient is not nervous/anxious.     MEDICAL HISTORY:  Past Medical History:  Diagnosis Date  . Anxiety   . Arthritis    SHOULDER  . Cataract   . Colon cancer ([place]) 2015   Pt states during his colonoscopy he had cancerous polyps removed.   . Depression    SINCE DIAGNOSIS  . Dyspnea    DOE  . Enlarged prostate   . Hypertension   . Lung cancer [facility])    Aug 2018  . Pain    DUE TO LUNG ISSUE    SURGICAL HISTORY: Past Surgical History:  Procedure Laterality Date  . CATARACT EXTRACTION W/ INTRAOCULAR LENS  IMPLANT, BILATERAL    . COLONOSCOPY    . EYE SURGERY    . LYMPH GLAND EXCISION N/A 02/09/2017   Procedure: CERVICAL LYMPH NODE BIOPSY;  Surgeon: Robert Bellow, MD;  Location: [facility];  Service: General;  Laterality: N/A;  . PORTACATH PLACEMENT Right 02/23/2017   Procedure: INSERTION PORT-A-CATH;  Surgeon: Robert Bellow, MD;  Location: [facility];  Service: General;  Laterality: Right;  . PROSTATE SURGERY    . TONSILLECTOMY      SOCIAL HISTORY: Social History   Socioeconomic History  . Marital status: Married    Spouse name: Not  on file  . Number of children: Not on file  . Years of education: Not on file  . Highest education level: Not on file  Social Needs  . Financial resource strain: Not on file  . Food insecurity - worry: Not on file  . Food insecurity - inability: Not on file  . Transportation needs - medical: Not on file  . Transportation needs - non-medical:  Not on file  Occupational History  . Not on file  Tobacco Use  . Smoking status: Former Smoker    Packs/day: 1.00    Years: 53.00    Pack years: 53.00    Types: Cigarettes    Last attempt to quit: 04/14/2017    Years since quitting: 0.3  . Smokeless tobacco: Never Used  . Tobacco comment: Quit November 2018  Substance and Sexual Activity  . Alcohol use: Yes    Comment: occassional  . Drug use: No  . Sexual activity: Yes  Other Topics Concern  . Not on file  Social History Narrative  . Not on file    FAMILY HISTORY: Family History  Problem Relation Age of Onset  . Breast cancer Maternal Grandmother   . Dementia Mother   . Diabetes Father   . Stroke Father     ALLERGIES:  has No Known Allergies.  MEDICATIONS:  Current Outpatient Medications  Medication Sig Dispense Refill  . aspirin 325 MG tablet Take 650 mg by mouth daily.     . Chlorpheniramine-Phenylephrine (SINUS & ALLERGY) 4-10 MG tablet Take 1 tablet by mouth daily as needed.     . diphenhydrAMINE-zinc acetate (BENADRYL EXTRA STRENGTH) cream Apply 1 application topically 3 (three) times daily as needed for itching. 28 g 0  . lidocaine-prilocaine (EMLA) cream Apply over port 1-2 hours prior to chemotherapy treatment.  Cover with plastic wrap. 30 g 1  . morphine 20 MG/5ML solution Take 2.5 mLs (10 mg total) by mouth every 6 (six) hours as needed for pain (before meal). 100 mL 0  . Multiple Vitamin (MULTI-VITAMINS) TABS Take 1 tablet by mouth daily.     . Nicotine 21-14-7 MG/24HR KIT Place 1 patch daily onto the skin. 1 each 0  . nystatin (MYCOSTATIN) 100000 UNIT/ML suspension Take 5 mLs (500,000 Units total) 4 (four) times daily by mouth. Swish and swallow 473 mL 0  . ondansetron (ZOFRAN) 8 MG tablet Take 1 tablet (8 mg total) by mouth every 8 (eight) hours as needed for nausea or vomiting. 60 tablet 1  . pantoprazole (PROTONIX) 40 MG tablet Take 1 tablet (40 mg total) by mouth daily. 30 tablet 2  . prochlorperazine  (COMPAZINE) 10 MG tablet Take 1 tablet (10 mg total) by mouth every 6 (six) hours as needed for nausea or vomiting. 60 tablet 2  . sucralfate (CARAFATE) 1 g tablet Take 1 tablet (1 g total) by mouth 3 (three) times daily. Dissolve in 2-3 tbsp warm water, swish and swallow 90 tablet 3  . triamcinolone ointment (KENALOG) 0.5 % Apply 1 application topically 3 (three) times daily as needed. 30 g 0  . levothyroxine (SYNTHROID) 25 MCG tablet Take 0.5 tablets (12.5 mcg total) by mouth daily before breakfast. 30 tablet 0   No current facility-administered medications for this visit.    Facility-Administered Medications Ordered in Other Visits  Medication Dose Route Frequency Provider Last Rate Last Dose  . durvalumab (IMFINZI) 860 mg in sodium chloride 0.9 % 100 mL chemo infusion  10 mg/kg (Treatment Plan Recorded) Intravenous Once  [name], MD 117 mL/hr at 08/03/17 1017 860 mg at 08/03/17 1017  . heparin lock flush 100 unit/mL  500 Units Intravenous Once [name], MD      . heparin lock flush 100 unit/mL  500 Units Intracatheter Once PRN [name], MD      . sodium chloride flush (NS) 0.9 % injection 10 mL  10 mL Intravenous PRN [name], MD   10 mL at 04/03/17 0459  . sodium chloride flush (NS) 0.9 % injection 10 mL  10 mL Intravenous PRN [name], MD   10 mL at 08/03/17 0836  . sodium chloride flush (NS) 0.9 % injection 10 mL  10 mL Intracatheter PRN [name], MD          .  PHYSICAL EXAMINATION: ECOG PERFORMANCE STATUS: 1 - Symptomatic but completely ambulatory Vitals:   08/03/17 0916  BP: (!) 144/83  Pulse: (!) 55  Temp: (!) 96.8 F (36 C)  SpO2: 98%   Filed Weights   08/03/17 0916  Weight: 195 lb 7 oz (88.6 kg)  Physical Exam  Constitutional: He is oriented to person, place, and time. No distress.  HENT:  Head: Normocephalic and atraumatic.  Mouth/Throat: Oropharynx is clear and moist. No oropharyngeal exudate.  Eyes: Conjunctivae and EOM are normal. Pupils are equal, round, and  reactive to light. Left eye exhibits no discharge. No scleral icterus.  Neck: Normal range of motion. Neck supple.  Cardiovascular: Normal rate, regular rhythm and normal heart sounds.  No murmur heard. Pulmonary/Chest: Effort normal and breath sounds normal. No stridor. He has no wheezes. He has no rales.  Decreased sound bilaterally lower lobe.   Abdominal: Soft. Bowel sounds are normal. He exhibits no distension and no mass. There is no tenderness. There is no rebound.  Musculoskeletal: Normal range of motion. He exhibits no edema or tenderness.  Lymphadenopathy:    He has no cervical adenopathy.  Neurological: He is alert and oriented to person, place, and time. No cranial nerve deficit. Coordination normal.  Skin: Skin is warm and dry. No erythema.  Scattered erythematous rash on back, mostly concentrated posterior right shoulder,resolved.    Psychiatric: Memory, affect and judgment normal.   LABORATORY DATA:  I have reviewed the data as listed Lab Results  Component Value Date   WBC 5.5 08/03/2017   HGB 12.7 (L) 08/03/2017   HCT 37.0 (L) 08/03/2017   MCV 94.7 08/03/2017   PLT 175 08/03/2017   Recent Labs    07/06/17 0820 07/20/17 0833 08/03/17 0836  NA 134* 135 132*  K 4.2 3.7 4.5  CL 102 104 99*  CO2 _0 GLUCOSE 101* 106* 94  BUN 26* 21* 27*  CREATININE 1.08 1.22 1.31*  CALCIUM 8.7* 8.8* 8.8*  GFRNONAA >60 58* 54*  GFRAA >60 >60 >60  PROT 6.5 6.9 6.8  ALBUMIN 3.3* 3.4* 3.4*  AST 24 24 39  ALT 20 17 35  ALKPHOS 54 57 56  BILITOT 0.4 0.5 0.6   TSH 3.152-->34.39  ASSESSMENT & PLAN:  Cancer Staging Adenocarcinoma of left lung, stage 3 (HCC) Staging form: Lung, AJCC 8th Edition - Clinical stage from 02/14/2017: Stage IIIB (cT2b, cN3, cM0) - Signed by [name], MD on 02/14/2017  1. Adenocarcinoma of left lung, stage 3 ([place])   2. Encounter for antineoplastic immunotherapy   3. Skin rash   4. Gastroesophageal reflux disease, esophagitis presence not  specified    Tolerates immunotherapy well. Proceed with today's Durvalumab. #Continue  Protonix daily. # Grade 1 Rash, likely due to immunotherapy. Resolved.  # Elevated TSH, Free T4 decreased. Start patient on Synthroid 12.102mg daily with further titration.  .  Return visit: Follow up in 2 weeks prior to next cycles of Durvalumab.  All questions were answered. The patient knows to call the clinic with any problems questions or concerns.   ZEarlie Server MD, PhD [facility]ematology [facility] Center[facility]ager- [phone]/12/2017

## 2017-08-10 DIAGNOSIS — H0102A Squamous blepharitis right eye, upper and lower eyelids: Secondary | ICD-10-CM | POA: Diagnosis not present

## 2017-08-14 ENCOUNTER — Telehealth: Payer: Self-pay | Admitting: *Deleted

## 2017-08-14 NOTE — Telephone Encounter (Signed)
Patient's wife called requesting a letter for her tax man an explanation his length of treatment. They are asking for an extension.

## 2017-08-17 ENCOUNTER — Encounter: Payer: Self-pay | Admitting: Oncology

## 2017-08-17 ENCOUNTER — Inpatient Hospital Stay: Payer: PPO

## 2017-08-17 ENCOUNTER — Inpatient Hospital Stay (HOSPITAL_BASED_OUTPATIENT_CLINIC_OR_DEPARTMENT_OTHER): Payer: PPO | Admitting: Oncology

## 2017-08-17 ENCOUNTER — Other Ambulatory Visit: Payer: Self-pay

## 2017-08-17 VITALS — BP 142/80 | HR 62 | Temp 96.0°F | Resp 18 | Wt 200.0 lb

## 2017-08-17 DIAGNOSIS — M1990 Unspecified osteoarthritis, unspecified site: Secondary | ICD-10-CM

## 2017-08-17 DIAGNOSIS — Z7982 Long term (current) use of aspirin: Secondary | ICD-10-CM

## 2017-08-17 DIAGNOSIS — Z803 Family history of malignant neoplasm of breast: Secondary | ICD-10-CM

## 2017-08-17 DIAGNOSIS — K219 Gastro-esophageal reflux disease without esophagitis: Secondary | ICD-10-CM | POA: Diagnosis not present

## 2017-08-17 DIAGNOSIS — I10 Essential (primary) hypertension: Secondary | ICD-10-CM

## 2017-08-17 DIAGNOSIS — Z79899 Other long term (current) drug therapy: Secondary | ICD-10-CM

## 2017-08-17 DIAGNOSIS — N400 Enlarged prostate without lower urinary tract symptoms: Secondary | ICD-10-CM

## 2017-08-17 DIAGNOSIS — F418 Other specified anxiety disorders: Secondary | ICD-10-CM

## 2017-08-17 DIAGNOSIS — C770 Secondary and unspecified malignant neoplasm of lymph nodes of head, face and neck: Secondary | ICD-10-CM

## 2017-08-17 DIAGNOSIS — R0609 Other forms of dyspnea: Secondary | ICD-10-CM

## 2017-08-17 DIAGNOSIS — R5383 Other fatigue: Secondary | ICD-10-CM | POA: Diagnosis not present

## 2017-08-17 DIAGNOSIS — C3492 Malignant neoplasm of unspecified part of left bronchus or lung: Secondary | ICD-10-CM

## 2017-08-17 DIAGNOSIS — Z87891 Personal history of nicotine dependence: Secondary | ICD-10-CM

## 2017-08-17 DIAGNOSIS — C3412 Malignant neoplasm of upper lobe, left bronchus or lung: Secondary | ICD-10-CM

## 2017-08-17 DIAGNOSIS — Z86010 Personal history of colonic polyps: Secondary | ICD-10-CM

## 2017-08-17 DIAGNOSIS — Z923 Personal history of irradiation: Secondary | ICD-10-CM

## 2017-08-17 DIAGNOSIS — R0981 Nasal congestion: Secondary | ICD-10-CM | POA: Diagnosis not present

## 2017-08-17 DIAGNOSIS — Z5112 Encounter for antineoplastic immunotherapy: Secondary | ICD-10-CM | POA: Diagnosis not present

## 2017-08-17 DIAGNOSIS — Z9221 Personal history of antineoplastic chemotherapy: Secondary | ICD-10-CM

## 2017-08-17 DIAGNOSIS — E032 Hypothyroidism due to medicaments and other exogenous substances: Secondary | ICD-10-CM

## 2017-08-17 LAB — COMPREHENSIVE METABOLIC PANEL
ALK PHOS: 53 U/L (ref 38–126)
ALT: 33 U/L (ref 17–63)
AST: 37 U/L (ref 15–41)
Albumin: 3.5 g/dL (ref 3.5–5.0)
Anion gap: 8 (ref 5–15)
BILIRUBIN TOTAL: 0.7 mg/dL (ref 0.3–1.2)
BUN: 23 mg/dL — AB (ref 6–20)
CALCIUM: 8.7 mg/dL — AB (ref 8.9–10.3)
CO2: 24 mmol/L (ref 22–32)
CREATININE: 1.43 mg/dL — AB (ref 0.61–1.24)
Chloride: 102 mmol/L (ref 101–111)
GFR calc Af Amer: 56 mL/min — ABNORMAL LOW (ref >60)
GFR calc non Af Amer: 48 mL/min — ABNORMAL LOW (ref >60)
Glucose, Bld: 94 mg/dL (ref 65–99)
Potassium: 4.2 mmol/L (ref 3.5–5.1)
Sodium: 134 mmol/L — ABNORMAL LOW (ref 135–145)
TOTAL PROTEIN: 6.9 g/dL (ref 6.5–8.1)

## 2017-08-17 LAB — CBC WITH DIFFERENTIAL/PLATELET
BASOS ABS: 0.1 10*3/uL (ref 0–0.1)
Basophils Relative: 1 %
EOS ABS: 0.7 10*3/uL (ref 0–0.7)
EOS PCT: 13 %
HCT: 37.1 % — ABNORMAL LOW (ref 40.0–52.0)
Hemoglobin: 12.9 g/dL — ABNORMAL LOW (ref 13.0–18.0)
Lymphocytes Relative: 18 %
Lymphs Abs: 1 10*3/uL (ref 1.0–3.6)
MCH: 32.5 pg (ref 26.0–34.0)
MCHC: 34.7 g/dL (ref 32.0–36.0)
MCV: 93.6 fL (ref 80.0–100.0)
Monocytes Absolute: 0.6 10*3/uL (ref 0.2–1.0)
Monocytes Relative: 11 %
Neutro Abs: 3.2 10*3/uL (ref 1.4–6.5)
Neutrophils Relative %: 57 %
Platelets: 175 10*3/uL (ref 150–440)
RBC: 3.97 MIL/uL — AB (ref 4.40–5.90)
RDW: 15.3 % — ABNORMAL HIGH (ref 11.5–14.5)
WBC: 5.5 10*3/uL (ref 3.8–10.6)

## 2017-08-17 MED ORDER — HEPARIN SOD (PORK) LOCK FLUSH 100 UNIT/ML IV SOLN
500.0000 [IU] | Freq: Once | INTRAVENOUS | Status: AC
  Administered 2017-08-17: 500 [IU] via INTRAVENOUS

## 2017-08-17 MED ORDER — SODIUM CHLORIDE 0.9 % IV SOLN
Freq: Once | INTRAVENOUS | Status: AC
  Administered 2017-08-17: 10:00:00 via INTRAVENOUS
  Filled 2017-08-17: qty 1000

## 2017-08-17 MED ORDER — SODIUM CHLORIDE 0.9 % IV SOLN
10.0000 mg/kg | Freq: Once | INTRAVENOUS | Status: AC
  Administered 2017-08-17: 860 mg via INTRAVENOUS
  Filled 2017-08-17: qty 10

## 2017-08-17 MED ORDER — SODIUM CHLORIDE 0.9% FLUSH
10.0000 mL | Freq: Once | INTRAVENOUS | Status: AC
  Administered 2017-08-17: 10 mL via INTRAVENOUS
  Filled 2017-08-17: qty 10

## 2017-08-17 MED ORDER — HEPARIN SOD (PORK) LOCK FLUSH 100 UNIT/ML IV SOLN
500.0000 [IU] | Freq: Once | INTRAVENOUS | Status: AC | PRN
  Administered 2017-08-17: 500 [IU]

## 2017-08-17 NOTE — Progress Notes (Signed)
Hematology/Oncology Follow Up Note [facility] [phone] [phone]   Patient Care Team: [name], MD as PCP - General (Family Medicine) [name], [name], MD (General Surgery) [name], MD as Consulting Physician (Oncology) [name], RN as Registered Nurse  Reason for Visit: follow up for treatment of lung cancer  HISTORY OF PRESENTING ILLNESS:  Marc Gray III 72 y.o.  male who presents for treatment of Stage IIIB (cT2b, cN3, cM0)  Adenocarcinoma of lung.  # Patient is s/p [place] and taxol concurrent RT for treatment of Stage IIIB Lung cancer. He recieved additional lung boost RT Currently on Durvalumab maintenance.   # Colon CA was listed in his history, however reviewing his surgical pathology from colonoscopy on 04/15/2014, during which he had polyps removed and all are negative for cancer.patient is scheduled for colonoscopy screening this year. .   INTERVAL HISTORY Patient he presents for evaluation prior to immunotherapy. Skin rash resolved. Eye infection resolved after using eye drops. He has nasal congestion today. Denies any cough, chest pain, abdominal pain, diarrhea, fever.  Feel fatigue. He takes Synthroid 12.43mcg daily (cut pill in half). Has not felt any palpitation.   Review of Systems  Constitutional: Positive for fatigue. Negative for appetite change, chills, fever and unexpected weight change.  HENT:   Negative for hearing loss, lump/mass, mouth sores, nosebleeds and sore throat.   Eyes: Negative for eye problems and icterus.  Respiratory: Negative for chest tightness and cough.        Chronic SOB with exertion.   Cardiovascular: Negative for chest pain and leg swelling.  Gastrointestinal: Negative for abdominal distention, abdominal pain, blood in stool, constipation, diarrhea, nausea and vomiting.  Endocrine: Negative for hot flashes.  Genitourinary: Negative for difficulty urinating, dysuria,  frequency and hematuria.   Musculoskeletal: Negative for arthralgias, flank pain and myalgias.  Skin: Negative for itching and rash.  Neurological: Negative for dizziness, headaches, light-headedness and seizures.  Hematological: Negative for adenopathy.  Psychiatric/Behavioral: Negative for confusion, decreased concentration and depression. The patient is not nervous/anxious.     MEDICAL HISTORY:  Past Medical History:  Diagnosis Date  . Anxiety   . Arthritis    SHOULDER  . Cataract   . Colon cancer ([place]) 2015   Pt states during his colonoscopy he had cancerous polyps removed.   . Depression    SINCE DIAGNOSIS  . Dyspnea    DOE  . Enlarged prostate   . Hypertension   . Lung cancer [facility])    Aug 2018  . Pain    DUE TO LUNG ISSUE    SURGICAL HISTORY: Past Surgical History:  Procedure Laterality Date  . CATARACT EXTRACTION W/ INTRAOCULAR LENS  IMPLANT, BILATERAL    . COLONOSCOPY    . EYE SURGERY    . LYMPH GLAND EXCISION N/A 02/09/2017   Procedure: CERVICAL LYMPH NODE BIOPSY;  Surgeon: Robert Bellow, MD;  Location: [place] [facility];  Service: General;  Laterality: N/A;  . PORTACATH PLACEMENT Right 02/23/2017   Procedure: INSERTION PORT-A-CATH;  Surgeon: Robert Bellow, MD;  Location: [facility];  Service: General;  Laterality: Right;  . PROSTATE SURGERY    . TONSILLECTOMY      SOCIAL HISTORY: Social History   Socioeconomic History  . Marital status: Married    Spouse name: Not on file  . Number of children: Not on file  . Years of education: Not on file  . Highest education level: Not on file  Occupational History  .  Not on file  Social Needs  . Financial resource strain: Not on file  . Food insecurity:    Worry: Not on file    Inability: Not on file  . Transportation needs:    Medical: Not on file    Non-medical: Not on file  Tobacco Use  . Smoking status: Former Smoker    Packs/day: 1.00    Years: 53.00    Pack years: 53.00    Types: Cigarettes    Last  attempt to quit: 04/14/2017    Years since quitting: 0.3  . Smokeless tobacco: Never Used  . Tobacco comment: Quit November 2018  Substance and Sexual Activity  . Alcohol use: Yes    Comment: occassional  . Drug use: No  . Sexual activity: Yes  Lifestyle  . Physical activity:    Days per week: Not on file    Minutes per session: Not on file  . Stress: Not on file  Relationships  . Social connections:    Talks on phone: Not on file    Gets together: Not on file    Attends religious service: Not on file    Active member of club or organization: Not on file    Attends meetings of clubs or organizations: Not on file    Relationship status: Not on file  . Intimate partner violence:    Fear of current or ex partner: Not on file    Emotionally abused: Not on file    Physically abused: Not on file    Forced sexual activity: Not on file  Other Topics Concern  . Not on file  Social History Narrative  . Not on file    FAMILY HISTORY: Family History  Problem Relation Age of Onset  . Breast cancer Maternal Grandmother   . Dementia Mother   . Diabetes Father   . Stroke Father     ALLERGIES:  has No Known Allergies.  MEDICATIONS:  Current Outpatient Medications  Medication Sig Dispense Refill  . Chlorpheniramine-Phenylephrine (SINUS & ALLERGY) 4-10 MG tablet Take 1 tablet by mouth daily as needed.     . diphenhydrAMINE-zinc acetate (BENADRYL EXTRA STRENGTH) cream Apply 1 application topically 3 (three) times daily as needed for itching. 28 g 0  . levothyroxine (SYNTHROID) 25 MCG tablet Take 0.5 tablets (12.5 mcg total) by mouth daily before breakfast. 30 tablet 0  . lidocaine-prilocaine (EMLA) cream Apply over port 1-2 hours prior to chemotherapy treatment.  Cover with plastic wrap. 30 g 1  . Multiple Vitamin (MULTI-VITAMINS) TABS Take 1 tablet by mouth daily.     . ondansetron (ZOFRAN) 8 MG tablet Take 1 tablet (8 mg total) by mouth every 8 (eight) hours as needed for nausea or  vomiting. 60 tablet 1  . pantoprazole (PROTONIX) 40 MG tablet Take 1 tablet (40 mg total) by mouth daily. 30 tablet 2  . sucralfate (CARAFATE) 1 g tablet Take 1 tablet (1 g total) by mouth 3 (three) times daily. Dissolve in 2-3 tbsp warm water, swish and swallow 90 tablet 3  . triamcinolone ointment (KENALOG) 0.5 % Apply 1 application topically 3 (three) times daily as needed. 30 g 0  . aspirin 325 MG tablet Take 650 mg by mouth daily.     [name] morphine 20 MG/5ML solution Take 2.5 mLs (10 mg total) by mouth every 6 (six) hours as needed for pain (before meal). (Patient not taking: Reported on 08/17/2017) 100 mL 0  . nystatin (MYCOSTATIN) 100000 UNIT/ML suspension Take 5  mLs (500,000 Units total) 4 (four) times daily by mouth. Swish and swallow (Patient not taking: Reported on 08/17/2017) 473 mL 0  . prochlorperazine (COMPAZINE) 10 MG tablet Take 1 tablet (10 mg total) by mouth every 6 (six) hours as needed for nausea or vomiting. (Patient not taking: Reported on 08/17/2017) 60 tablet 2   No current facility-administered medications for this visit.    Facility-Administered Medications Ordered in Other Visits  Medication Dose Route Frequency Provider Last Rate Last Dose  . sodium chloride flush (NS) 0.9 % injection 10 mL  10 mL Intravenous PRN [name], MD   10 mL at 04/03/17 1914      .  PHYSICAL EXAMINATION: ECOG PERFORMANCE STATUS: 1 - Symptomatic but completely ambulatory Vitals:   08/17/17 0854  BP: (!) 142/80  Pulse: 62  Resp: 18  Temp: (!) 96 F (35.6 C)   Filed Weights   08/17/17 0854  Weight: 200 lb (90.7 kg)  Physical Exam  Constitutional: He is oriented to person, place, and time and well-developed, well-nourished, and in no distress. No distress.  HENT:  Head: Normocephalic and atraumatic.  Mouth/Throat: Oropharynx is clear and moist. No oropharyngeal exudate.  Eyes: Pupils are equal, round, and reactive to light. Conjunctivae and EOM are normal. Left eye exhibits no  discharge. No scleral icterus.  Neck: Normal range of motion. Neck supple.  Cardiovascular: Normal rate, regular rhythm and normal heart sounds.  No murmur heard. Pulmonary/Chest: Effort normal and breath sounds normal. No stridor. He has no wheezes. He has no rales.  Decreased sound bilaterally lower lobe.   Abdominal: Soft. Bowel sounds are normal. He exhibits no distension and no mass. There is no tenderness.  Musculoskeletal: Normal range of motion. He exhibits no edema or tenderness.  Lymphadenopathy:    He has no cervical adenopathy.  Neurological: He is alert and oriented to person, place, and time. No cranial nerve deficit. Coordination normal.  Skin: Skin is warm and dry. No erythema.  Psychiatric: Memory, affect and judgment normal.   LABORATORY DATA:  I have reviewed the data as listed Lab Results  Component Value Date   WBC 5.5 08/17/2017   HGB 12.9 (L) 08/17/2017   HCT 37.1 (L) 08/17/2017   MCV 93.6 08/17/2017   PLT 175 08/17/2017   Recent Labs    07/20/17 0833 08/03/17 0836 08/17/17 0837  NA 135 132* 134*  K 3.7 4.5 4.2  CL 104 99* 102  CO2 23 28 24   GLUCOSE 106* 94 94  BUN 21* 27* 23*  CREATININE 1.22 1.31* 1.43*  CALCIUM 8.8* 8.8* 8.7*  GFRNONAA 58* 54* 48*  GFRAA >60 >60 56*  PROT 6.9 6.8 6.9  ALBUMIN 3.4* 3.4* 3.5  AST 24 39 37  ALT 17 35 33  ALKPHOS 57 56 53  BILITOT 0.5 0.6 0.7   TSH 3.152-->34.39  ASSESSMENT & PLAN:  Cancer Staging Adenocarcinoma of left lung, stage 3 (HCC) Staging form: Lung, AJCC 8th Edition - Clinical stage from 02/14/2017: Stage IIIB (cT2b, cN3, cM0) - Signed by [name], MD on 02/14/2017  1. Adenocarcinoma of left lung, stage 3 ([place])   2. Encounter for antineoplastic immunotherapy   3. Hypothyroidism due to medication    Tolerates immunotherapy well. Proceed with today's treatment of Durvalumab. #Continue Protonix daily. #Skin Rash resolved.  # Acquired hypothyroidism: started on low dose synthroid 12.54mcg. Given  his age and history of CAD, will titrate up slowly. Plan check TSH at next visit and increase his synthroid  dose in needed.   Return visit: Follow up in 2 weeks prior to next cycles of Durvalumab.  All questions were answered. The patient knows to call the clinic with any problems questions or concerns.   [name], MD, PhD Hematology Oncology [facility] at [facility] Pager- [phone] 08/17/2017

## 2017-08-17 NOTE — Progress Notes (Signed)
Here for follow up-stated doing well- w wt gain he is pleased about

## 2017-08-24 DIAGNOSIS — H0102A Squamous blepharitis right eye, upper and lower eyelids: Secondary | ICD-10-CM | POA: Diagnosis not present

## 2017-08-31 ENCOUNTER — Other Ambulatory Visit: Payer: Self-pay

## 2017-08-31 ENCOUNTER — Inpatient Hospital Stay (HOSPITAL_BASED_OUTPATIENT_CLINIC_OR_DEPARTMENT_OTHER): Payer: PPO | Admitting: Oncology

## 2017-08-31 ENCOUNTER — Encounter: Payer: Self-pay | Admitting: Oncology

## 2017-08-31 ENCOUNTER — Inpatient Hospital Stay: Payer: PPO

## 2017-08-31 ENCOUNTER — Inpatient Hospital Stay: Payer: PPO | Attending: Oncology

## 2017-08-31 VITALS — BP 165/88 | HR 59 | Temp 95.4°F | Resp 18 | Wt 205.3 lb

## 2017-08-31 DIAGNOSIS — R0609 Other forms of dyspnea: Secondary | ICD-10-CM | POA: Diagnosis not present

## 2017-08-31 DIAGNOSIS — Z87891 Personal history of nicotine dependence: Secondary | ICD-10-CM

## 2017-08-31 DIAGNOSIS — R21 Rash and other nonspecific skin eruption: Secondary | ICD-10-CM | POA: Diagnosis not present

## 2017-08-31 DIAGNOSIS — C3492 Malignant neoplasm of unspecified part of left bronchus or lung: Secondary | ICD-10-CM

## 2017-08-31 DIAGNOSIS — R030 Elevated blood-pressure reading, without diagnosis of hypertension: Secondary | ICD-10-CM

## 2017-08-31 DIAGNOSIS — Z7982 Long term (current) use of aspirin: Secondary | ICD-10-CM | POA: Insufficient documentation

## 2017-08-31 DIAGNOSIS — C3412 Malignant neoplasm of upper lobe, left bronchus or lung: Secondary | ICD-10-CM | POA: Insufficient documentation

## 2017-08-31 DIAGNOSIS — N400 Enlarged prostate without lower urinary tract symptoms: Secondary | ICD-10-CM

## 2017-08-31 DIAGNOSIS — E032 Hypothyroidism due to medicaments and other exogenous substances: Secondary | ICD-10-CM | POA: Diagnosis not present

## 2017-08-31 DIAGNOSIS — H1089 Other conjunctivitis: Secondary | ICD-10-CM | POA: Diagnosis not present

## 2017-08-31 DIAGNOSIS — I10 Essential (primary) hypertension: Secondary | ICD-10-CM | POA: Diagnosis not present

## 2017-08-31 DIAGNOSIS — I2510 Atherosclerotic heart disease of native coronary artery without angina pectoris: Secondary | ICD-10-CM | POA: Diagnosis not present

## 2017-08-31 DIAGNOSIS — Z79899 Other long term (current) drug therapy: Secondary | ICD-10-CM | POA: Insufficient documentation

## 2017-08-31 DIAGNOSIS — C770 Secondary and unspecified malignant neoplasm of lymph nodes of head, face and neck: Secondary | ICD-10-CM | POA: Insufficient documentation

## 2017-08-31 DIAGNOSIS — F419 Anxiety disorder, unspecified: Secondary | ICD-10-CM | POA: Insufficient documentation

## 2017-08-31 DIAGNOSIS — Z5112 Encounter for antineoplastic immunotherapy: Secondary | ICD-10-CM

## 2017-08-31 DIAGNOSIS — J328 Other chronic sinusitis: Secondary | ICD-10-CM

## 2017-08-31 DIAGNOSIS — R5383 Other fatigue: Secondary | ICD-10-CM

## 2017-08-31 LAB — COMPREHENSIVE METABOLIC PANEL
ALK PHOS: 55 U/L (ref 38–126)
ALT: 37 U/L (ref 17–63)
AST: 48 U/L — AB (ref 15–41)
Albumin: 3.7 g/dL (ref 3.5–5.0)
Anion gap: 8 (ref 5–15)
BUN: 33 mg/dL — AB (ref 6–20)
CALCIUM: 8.7 mg/dL — AB (ref 8.9–10.3)
CHLORIDE: 100 mmol/L — AB (ref 101–111)
CO2: 24 mmol/L (ref 22–32)
CREATININE: 1.54 mg/dL — AB (ref 0.61–1.24)
GFR calc Af Amer: 51 mL/min — ABNORMAL LOW (ref >60)
GFR, EST NON AFRICAN AMERICAN: 44 mL/min — AB (ref >60)
Glucose, Bld: 81 mg/dL (ref 65–99)
Potassium: 4.3 mmol/L (ref 3.5–5.1)
Sodium: 132 mmol/L — ABNORMAL LOW (ref 135–145)
Total Bilirubin: 0.5 mg/dL (ref 0.3–1.2)
Total Protein: 7 g/dL (ref 6.5–8.1)

## 2017-08-31 LAB — CBC WITH DIFFERENTIAL/PLATELET
BASOS ABS: 0.1 10*3/uL (ref 0–0.1)
Basophils Relative: 1 %
Eosinophils Absolute: 0.4 10*3/uL (ref 0–0.7)
Eosinophils Relative: 9 %
HEMATOCRIT: 37 % — AB (ref 40.0–52.0)
HEMOGLOBIN: 12.9 g/dL — AB (ref 13.0–18.0)
LYMPHS ABS: 0.8 10*3/uL — AB (ref 1.0–3.6)
LYMPHS PCT: 17 %
MCH: 32.2 pg (ref 26.0–34.0)
MCHC: 34.8 g/dL (ref 32.0–36.0)
MCV: 92.5 fL (ref 80.0–100.0)
Monocytes Absolute: 0.5 10*3/uL (ref 0.2–1.0)
Monocytes Relative: 10 %
Neutro Abs: 2.8 10*3/uL (ref 1.4–6.5)
Neutrophils Relative %: 63 %
Platelets: 149 10*3/uL — ABNORMAL LOW (ref 150–440)
RBC: 4 MIL/uL — AB (ref 4.40–5.90)
RDW: 15.5 % — ABNORMAL HIGH (ref 11.5–14.5)
WBC: 4.5 10*3/uL (ref 3.8–10.6)

## 2017-08-31 LAB — TSH: TSH: 101.257 u[IU]/mL — ABNORMAL HIGH (ref 0.350–4.500)

## 2017-08-31 MED ORDER — SODIUM CHLORIDE 0.9 % IV SOLN
Freq: Once | INTRAVENOUS | Status: AC
  Administered 2017-08-31: 10:00:00 via INTRAVENOUS
  Filled 2017-08-31: qty 1000

## 2017-08-31 MED ORDER — TRIAMCINOLONE ACETONIDE 0.5 % EX OINT: 1.0000 "application " | TOPICAL_OINTMENT | Freq: Three times a day (TID) | CUTANEOUS | 0 refills | Status: DC | PRN

## 2017-08-31 MED ORDER — HEPARIN SOD (PORK) LOCK FLUSH 100 UNIT/ML IV SOLN
500.0000 [IU] | Freq: Once | INTRAVENOUS | Status: AC | PRN
  Administered 2017-08-31: 500 [IU]
  Filled 2017-08-31: qty 5

## 2017-08-31 MED ORDER — LEVOTHYROXINE SODIUM 25 MCG PO TABS: 25.0000 ug | ORAL_TABLET | Freq: Every day | ORAL | 0 refills | Status: DC

## 2017-08-31 MED ORDER — SODIUM CHLORIDE 0.9 % IV SOLN
10.0000 mg/kg | Freq: Once | INTRAVENOUS | Status: AC
  Administered 2017-08-31: 860 mg via INTRAVENOUS
  Filled 2017-08-31: qty 10

## 2017-08-31 MED ORDER — FLUTICASONE PROPIONATE 50 MCG/ACT NA SUSP: 1.0000 | Freq: Every day | NASAL | 2 refills | Status: DC

## 2017-08-31 NOTE — Progress Notes (Signed)
Hematology/Oncology Follow Up Note [facility] [phone] [phone]   Patient Care Team: [name], MD as PCP - General (Family Medicine) [name], [name], MD (General Surgery) [name], MD as Consulting Physician (Oncology) [name], RN as Registered Nurse  Reason for Visit: follow up for treatment of lung cancer  HISTORY OF PRESENTING ILLNESS:  Marc Gray 72 y.o.  male who presents for treatment of Stage IIIB (cT2b, cN3, cM0)  Adenocarcinoma of lung.  # Patient is s/p [place] and taxol concurrent RT for treatment of Stage IIIB Lung cancer. He recieved additional lung boost RT Currently on Durvalumab maintenance.   # Colon CA was listed in his history, however reviewing his surgical pathology from colonoscopy on 04/15/2014, during which he had polyps removed and all are negative for cancer.patient is scheduled for colonoscopy screening this year. .   INTERVAL HISTORY Patient he presents for evaluation prior to immunotherapy.  He has mild skin rash right posterior shoulder which is controlled by steroid cream.  He wants a refill of steroid cream.  He still has mild eye itchiness.  Denies any worsening of cough, shortness of breath than his baseline.  No abdominal pain, diarrhea, fever or chills.  Feels fatigue. He has nasal congestion and use chlorpheniramine-phenylephrine for his chronic sinusitis symptoms.    Review of Systems  Constitutional: Positive for fatigue. Negative for appetite change, chills, fever and unexpected weight change.  HENT:   Negative for hearing loss, lump/mass, mouth sores, nosebleeds and sore throat.        Nasal congestion  Eyes: Negative for eye problems and icterus.  Respiratory: Negative for chest tightness and cough.        Chronic SOB with exertion.   Cardiovascular: Negative for chest pain and leg swelling.  Gastrointestinal: Negative for abdominal distention, abdominal pain,  blood in stool, constipation, diarrhea, nausea and vomiting.  Endocrine: Negative for hot flashes.  Genitourinary: Negative for difficulty urinating, dysuria, frequency and hematuria.   Musculoskeletal: Negative for arthralgias, flank pain and myalgias.  Skin: Negative for itching and rash.  Neurological: Negative for dizziness, headaches, light-headedness and seizures.  Hematological: Negative for adenopathy.  Psychiatric/Behavioral: Negative for confusion and depression.    MEDICAL HISTORY:  Past Medical History:  Diagnosis Date  . Anxiety   . Arthritis    SHOULDER  . Cataract   . Colon cancer ([place]) 2015   Pt states during his colonoscopy he had cancerous polyps removed.   . Depression    SINCE DIAGNOSIS  . Dyspnea    DOE  . Enlarged prostate   . Hypertension   . Lung cancer [facility])    Aug 2018  . Pain    DUE TO LUNG ISSUE    SURGICAL HISTORY: Past Surgical History:  Procedure Laterality Date  . CATARACT EXTRACTION W/ INTRAOCULAR LENS  IMPLANT, BILATERAL    . COLONOSCOPY    . EYE SURGERY    . LYMPH GLAND EXCISION N/A 02/09/2017   Procedure: CERVICAL LYMPH NODE BIOPSY;  Surgeon: Robert Bellow, MD;  Location: [place] [facility];  Service: General;  Laterality: N/A;  . PORTACATH PLACEMENT Right 02/23/2017   Procedure: INSERTION PORT-A-CATH;  Surgeon: Robert Bellow, MD;  Location: [facility];  Service: General;  Laterality: Right;  . PROSTATE SURGERY    . TONSILLECTOMY      SOCIAL HISTORY: Social History   Socioeconomic History  . Marital status: Married    Spouse name: Not on file  . Number  of children: Not on file  . Years of education: Not on file  . Highest education level: Not on file  Occupational History  . Not on file  Social Needs  . Financial resource strain: Not on file  . Food insecurity:    Worry: Not on file    Inability: Not on file  . Transportation needs:    Medical: Not on file    Non-medical: Not on file  Tobacco Use  . Smoking status: Former  Smoker    Packs/day: 1.00    Years: 53.00    Pack years: 53.00    Types: Cigarettes    Last attempt to quit: 04/14/2017    Years since quitting: 0.3  . Smokeless tobacco: Never Used  . Tobacco comment: Quit November 2018  Substance and Sexual Activity  . Alcohol use: Yes    Comment: occassional  . Drug use: No  . Sexual activity: Yes  Lifestyle  . Physical activity:    Days per week: Not on file    Minutes per session: Not on file  . Stress: Not on file  Relationships  . Social connections:    Talks on phone: Not on file    Gets together: Not on file    Attends religious service: Not on file    Active member of club or organization: Not on file    Attends meetings of clubs or organizations: Not on file    Relationship status: Not on file  . Intimate partner violence:    Fear of current or ex partner: Not on file    Emotionally abused: Not on file    Physically abused: Not on file    Forced sexual activity: Not on file  Other Topics Concern  . Not on file  Social History Narrative  . Not on file    FAMILY HISTORY: Family History  Problem Relation Age of Onset  . Breast cancer Maternal Grandmother   . Dementia Mother   . Diabetes Father   . Stroke Father     ALLERGIES:  has No Known Allergies.  MEDICATIONS:  Current Outpatient Medications  Medication Sig Dispense Refill  . Chlorpheniramine-Phenylephrine (SINUS & ALLERGY) 4-10 MG tablet Take 1 tablet by mouth daily as needed.     [name] levothyroxine (SYNTHROID) 25 MCG tablet Take 1 tablet (25 mcg total) by mouth daily before breakfast. 30 tablet 0  . lidocaine-prilocaine (EMLA) cream Apply over port 1-2 hours prior to chemotherapy treatment.  Cover with plastic wrap. 30 g 1  . Multiple Vitamin (MULTI-VITAMINS) TABS Take 1 tablet by mouth daily.     . ondansetron (ZOFRAN) 8 MG tablet Take 1 tablet (8 mg total) by mouth every 8 (eight) hours as needed for nausea or vomiting. 60 tablet 1  . pantoprazole (PROTONIX) 40 MG  tablet Take 1 tablet (40 mg total) by mouth daily. 30 tablet 2  . triamcinolone ointment (KENALOG) 0.5 % Apply 1 application topically 3 (three) times daily as needed. 30 g 0  . aspirin 325 MG tablet Take 650 mg by mouth daily.     . diphenhydrAMINE-zinc acetate (BENADRYL EXTRA STRENGTH) cream Apply 1 application topically 3 (three) times daily as needed for itching. (Patient not taking: Reported on 08/31/2017) 28 g 0  . fluticasone (FLONASE) 50 MCG/ACT nasal spray Place 1 spray into both nostrils daily. 16 g 2  . morphine 20 MG/5ML solution Take 2.5 mLs (10 mg total) by mouth every 6 (six) hours as needed for pain (before  meal). (Patient not taking: Reported on 08/31/2017) 100 mL 0  . nystatin (MYCOSTATIN) 100000 UNIT/ML suspension Take 5 mLs (500,000 Units total) 4 (four) times daily by mouth. Swish and swallow (Patient not taking: Reported on 08/17/2017) 473 mL 0  . prochlorperazine (COMPAZINE) 10 MG tablet Take 1 tablet (10 mg total) by mouth every 6 (six) hours as needed for nausea or vomiting. (Patient not taking: Reported on 08/17/2017) 60 tablet 2  . sucralfate (CARAFATE) 1 g tablet Take 1 tablet (1 g total) by mouth 3 (three) times daily. Dissolve in 2-3 tbsp warm water, swish and swallow (Patient not taking: Reported on 08/31/2017) 90 tablet 3   No current facility-administered medications for this visit.    Facility-Administered Medications Ordered in Other Visits  Medication Dose Route Frequency Provider Last Rate Last Dose  . sodium chloride flush (NS) 0.9 % injection 10 mL  10 mL Intravenous PRN [name], MD   10 mL at 04/03/17 8295      .  PHYSICAL EXAMINATION: ECOG PERFORMANCE STATUS: 1 - Symptomatic but completely ambulatory Vitals:   08/31/17 0928  BP: (!) 165/88  Pulse: (!) 59  Resp: 18  Temp: (!) 95.4 F (35.2 C)   Filed Weights   08/31/17 0928  Weight: 205 lb 4.8 oz (93.1 kg)  Physical Exam  Constitutional: He is oriented to person, place, and time and well-developed,  well-nourished, and in no distress. No distress.  HENT:  Head: Normocephalic and atraumatic.  Mouth/Throat: Oropharynx is clear and moist. No oropharyngeal exudate.  Eyes: Pupils are equal, round, and reactive to light. Conjunctivae and EOM are normal. No scleral icterus.  Erythematous conjunctiva.   Neck: Normal range of motion. Neck supple.  Cardiovascular: Normal rate and regular rhythm.  No murmur heard. Pulmonary/Chest: Effort normal and breath sounds normal. No stridor. He has no wheezes. He has no rales.  Decreased sound bilaterally lower lobe.   Abdominal: Soft. Bowel sounds are normal. He exhibits no distension and no mass. There is no tenderness.  Musculoskeletal: Normal range of motion. He exhibits no edema or tenderness.  Lymphadenopathy:    He has no cervical adenopathy.  Neurological: He is alert and oriented to person, place, and time. No cranial nerve deficit. Coordination normal.  Skin: Skin is warm and dry. No erythema.  Posterior right shoulder erythematous rash.   Psychiatric: Affect and judgment normal.   LABORATORY DATA:  I have reviewed the data as listed Lab Results  Component Value Date   WBC 4.5 08/31/2017   HGB 12.9 (L) 08/31/2017   HCT 37.0 (L) 08/31/2017   MCV 92.5 08/31/2017   PLT 149 (L) 08/31/2017   Recent Labs    08/03/17 0836 08/17/17 0837 08/31/17 0918  NA 132* 134* 132*  K 4.5 4.2 4.3  CL 99* 102 100*  CO2 28 24 24   GLUCOSE 94 94 81  BUN 27* 23* 33*  CREATININE 1.31* 1.43* 1.54*  CALCIUM 8.8* 8.7* 8.7*  GFRNONAA 54* 48* 44*  GFRAA >60 56* 51*  PROT 6.8 6.9 7.0  ALBUMIN 3.4* 3.5 3.7  AST 39 37 48*  ALT 35 33 37  ALKPHOS 56 53 55  BILITOT 0.6 0.7 0.5   TSH 3.152-->34.39  ASSESSMENT & PLAN:  Cancer Staging Adenocarcinoma of left lung, stage 3 (HCC) Staging form: Lung, AJCC 8th Edition - Clinical stage from 02/14/2017: Stage IIIB (cT2b, cN3, cM0) - Signed by [name], MD on 02/14/2017  1. Hypothyroidism due to medication   2.  Encounter  for antineoplastic immunotherapy   3. Adenocarcinoma of left lung, stage 3 ([place])   4. Skin rash   5. Other fatigue   6. Elevated blood pressure reading    Tolerates immunotherapy well. Proceed today's value map treatment.  #Skin Rash, likely skin toxicity secondary to immunotherapy.  Continue steroid cream as needed. # Acquired hypothyroidism: TSH came back trending up, I called the patient and he did not answer.  I called patient's wife and advised patient to increase his daily Synthroid to 25 MCG daily .  Will recheck TSH in 2 weeks and plan to titrate up further.  Given his age and history of CAD, plan increase Synthroid dose slowly.  #Patient has elevated blood pressure reading for the past few visits, he admits to using sinus medication which can potentially raise his blood pressure.  Advised patient to stop using his sinus medication, and to monitor his blood pressure at home.  If is persistently high, he needs to see his primary care physician to be started on blood pressure medication.  Return visit: Follow up in 2 weeks prior to next cycles of Durvalumab.  All questions were answered. The patient knows to call the clinic with any problems questions or concerns.   [name], MD, PhD Hematology Oncology [facility] at [facility] Pager- [phone] 08/31/2017

## 2017-08-31 NOTE — Progress Notes (Signed)
Here for follow up. Stated feeling fatigued.  Asking for renewal of Kenalog oint. 0.5%

## 2017-08-31 NOTE — Progress Notes (Signed)
Creatinine 1.54 today. Per Dr [name] okay to proceed with treatment.

## 2017-09-11 ENCOUNTER — Encounter: Payer: Self-pay | Admitting: *Deleted

## 2017-09-14 ENCOUNTER — Inpatient Hospital Stay: Payer: PPO

## 2017-09-14 ENCOUNTER — Inpatient Hospital Stay (HOSPITAL_BASED_OUTPATIENT_CLINIC_OR_DEPARTMENT_OTHER): Payer: PPO | Admitting: Oncology

## 2017-09-14 ENCOUNTER — Other Ambulatory Visit: Payer: Self-pay | Admitting: Oncology

## 2017-09-14 ENCOUNTER — Other Ambulatory Visit: Payer: Self-pay

## 2017-09-14 ENCOUNTER — Encounter: Payer: Self-pay | Admitting: Oncology

## 2017-09-14 VITALS — BP 123/82 | HR 61 | Temp 96.6°F | Wt 208.2 lb

## 2017-09-14 DIAGNOSIS — H1089 Other conjunctivitis: Secondary | ICD-10-CM | POA: Diagnosis not present

## 2017-09-14 DIAGNOSIS — E032 Hypothyroidism due to medicaments and other exogenous substances: Secondary | ICD-10-CM

## 2017-09-14 DIAGNOSIS — Z5112 Encounter for antineoplastic immunotherapy: Secondary | ICD-10-CM | POA: Diagnosis not present

## 2017-09-14 DIAGNOSIS — N400 Enlarged prostate without lower urinary tract symptoms: Secondary | ICD-10-CM

## 2017-09-14 DIAGNOSIS — R5383 Other fatigue: Secondary | ICD-10-CM | POA: Diagnosis not present

## 2017-09-14 DIAGNOSIS — Z87891 Personal history of nicotine dependence: Secondary | ICD-10-CM

## 2017-09-14 DIAGNOSIS — I2510 Atherosclerotic heart disease of native coronary artery without angina pectoris: Secondary | ICD-10-CM

## 2017-09-14 DIAGNOSIS — C3492 Malignant neoplasm of unspecified part of left bronchus or lung: Secondary | ICD-10-CM

## 2017-09-14 DIAGNOSIS — C770 Secondary and unspecified malignant neoplasm of lymph nodes of head, face and neck: Secondary | ICD-10-CM | POA: Diagnosis not present

## 2017-09-14 DIAGNOSIS — C3412 Malignant neoplasm of upper lobe, left bronchus or lung: Secondary | ICD-10-CM

## 2017-09-14 DIAGNOSIS — I10 Essential (primary) hypertension: Secondary | ICD-10-CM

## 2017-09-14 DIAGNOSIS — H10403 Unspecified chronic conjunctivitis, bilateral: Secondary | ICD-10-CM

## 2017-09-14 DIAGNOSIS — Z79899 Other long term (current) drug therapy: Secondary | ICD-10-CM | POA: Diagnosis not present

## 2017-09-14 DIAGNOSIS — R21 Rash and other nonspecific skin eruption: Secondary | ICD-10-CM | POA: Diagnosis not present

## 2017-09-14 DIAGNOSIS — F419 Anxiety disorder, unspecified: Secondary | ICD-10-CM

## 2017-09-14 DIAGNOSIS — Z7982 Long term (current) use of aspirin: Secondary | ICD-10-CM | POA: Diagnosis not present

## 2017-09-14 DIAGNOSIS — R030 Elevated blood-pressure reading, without diagnosis of hypertension: Secondary | ICD-10-CM

## 2017-09-14 LAB — COMPREHENSIVE METABOLIC PANEL
ALBUMIN: 3.8 g/dL (ref 3.5–5.0)
ALT: 47 U/L (ref 17–63)
AST: 54 U/L — AB (ref 15–41)
Alkaline Phosphatase: 58 U/L (ref 38–126)
Anion gap: 8 (ref 5–15)
BUN: 27 mg/dL — ABNORMAL HIGH (ref 6–20)
CHLORIDE: 102 mmol/L (ref 101–111)
CO2: 24 mmol/L (ref 22–32)
Calcium: 9.3 mg/dL (ref 8.9–10.3)
Creatinine, Ser: 1.57 mg/dL — ABNORMAL HIGH (ref 0.61–1.24)
GFR calc Af Amer: 50 mL/min — ABNORMAL LOW (ref >60)
GFR calc non Af Amer: 43 mL/min — ABNORMAL LOW (ref >60)
GLUCOSE: 86 mg/dL (ref 65–99)
POTASSIUM: 4.1 mmol/L (ref 3.5–5.1)
SODIUM: 134 mmol/L — AB (ref 135–145)
Total Bilirubin: 0.7 mg/dL (ref 0.3–1.2)
Total Protein: 7 g/dL (ref 6.5–8.1)

## 2017-09-14 LAB — CBC WITH DIFFERENTIAL/PLATELET
Basophils Absolute: 0.1 10*3/uL (ref 0–0.1)
Basophils Relative: 1 %
EOS PCT: 9 %
Eosinophils Absolute: 0.4 10*3/uL (ref 0–0.7)
HEMATOCRIT: 38.4 % — AB (ref 40.0–52.0)
Hemoglobin: 13.2 g/dL (ref 13.0–18.0)
LYMPHS ABS: 0.8 10*3/uL — AB (ref 1.0–3.6)
LYMPHS PCT: 17 %
MCH: 31.7 pg (ref 26.0–34.0)
MCHC: 34.4 g/dL (ref 32.0–36.0)
MCV: 92.1 fL (ref 80.0–100.0)
MONO ABS: 0.6 10*3/uL (ref 0.2–1.0)
Monocytes Relative: 12 %
Neutro Abs: 3 10*3/uL (ref 1.4–6.5)
Neutrophils Relative %: 61 %
PLATELETS: 155 10*3/uL (ref 150–440)
RBC: 4.17 MIL/uL — AB (ref 4.40–5.90)
RDW: 16.4 % — ABNORMAL HIGH (ref 11.5–14.5)
WBC: 4.9 10*3/uL (ref 3.8–10.6)

## 2017-09-14 LAB — TSH: TSH: 97.025 u[IU]/mL — ABNORMAL HIGH (ref 0.350–4.500)

## 2017-09-14 MED ORDER — SODIUM CHLORIDE 0.9 % IV SOLN
10.0000 mg/kg | Freq: Once | INTRAVENOUS | Status: AC
  Administered 2017-09-14: 860 mg via INTRAVENOUS
  Filled 2017-09-14: qty 10

## 2017-09-14 MED ORDER — HEPARIN SOD (PORK) LOCK FLUSH 100 UNIT/ML IV SOLN
500.0000 [IU] | Freq: Once | INTRAVENOUS | Status: AC | PRN
  Administered 2017-09-14: 500 [IU]

## 2017-09-14 MED ORDER — SODIUM CHLORIDE 0.9 % IV SOLN
Freq: Once | INTRAVENOUS | Status: AC
  Administered 2017-09-14: 11:00:00 via INTRAVENOUS
  Filled 2017-09-14: qty 1000

## 2017-09-14 MED ORDER — LEVOTHYROXINE SODIUM 25 MCG PO TABS: 50.0000 ug | ORAL_TABLET | Freq: Every day | ORAL | 0 refills | Status: DC

## 2017-09-14 NOTE — Progress Notes (Signed)
Patient here today for follow up.  Patient c/o SOB and fatigue.

## 2017-09-14 NOTE — Progress Notes (Signed)
Hematology/Oncology Follow Up Note [facility] Telephone:([phone] [phone]   Patient Care Team: [name], MD as PCP - General (Family Medicine) [name], [name], MD (General Surgery) [name], MD as Consulting Physician (Oncology) [name], RN as Registered Nurse  Reason for Visit: follow up for treatment of lung cancer  HISTORY OF PRESENTING ILLNESS:  Marc Gray 72 y.o.  male who presents for treatment of Stage IIIB (cT2b, cN3, cM0)  Adenocarcinoma of lung.  # Patient is s/p [place] and taxol concurrent RT for treatment of Stage IIIB Lung cancer. He recieved additional lung boost RT Currently on Durvalumab maintenance.   # Colon CA was listed in his history, however reviewing his surgical pathology from colonoscopy on 04/15/2014, during which he had polyps removed and all are negative for cancer.patient is scheduled for colonoscopy screening this year. .   INTERVAL HISTORY Patient he presents for evaluation prior to immunotherapy.   Continue to feel quite fatigued. Taking Synthroid 38mcg daily. Continues to have eye irritation for which he uses eye drops "off and one".  Denies any worsening of cough, shortness of breath than his baseline.  No abdominal pain, diarrhea, fever or chills.   Review of Systems  Constitutional: Positive for fatigue. Negative for appetite change, chills, fever and unexpected weight change.  HENT:   Negative for hearing loss, lump/mass, mouth sores, nosebleeds and sore throat.   Eyes: Negative for eye problems and icterus.       Eye irritation.  Respiratory: Negative for chest tightness and cough.        Chronic SOB with exertion.   Cardiovascular: Negative for chest pain, leg swelling and palpitations.  Gastrointestinal: Negative for abdominal distention, abdominal pain, blood in stool, nausea, rectal pain and vomiting.  Endocrine: Negative for hot flashes.  Genitourinary: Negative for  difficulty urinating, dysuria, frequency and hematuria.   Musculoskeletal: Negative for arthralgias, back pain, flank pain and myalgias.  Skin: Negative for itching and rash.  Neurological: Negative for dizziness, headaches, light-headedness and seizures.  Hematological: Negative for adenopathy. Does not bruise/bleed easily.  Psychiatric/Behavioral: Negative for confusion, decreased concentration and depression.    MEDICAL HISTORY:  Past Medical History:  Diagnosis Date  . Anxiety   . Arthritis    SHOULDER  . Cataract   . Colon cancer ([place]) 2015   Pt states during his colonoscopy he had cancerous polyps removed.   . Depression    SINCE DIAGNOSIS  . Dyspnea    DOE  . Enlarged prostate   . Hypertension   . Lung cancer [facility])    Aug 2018  . Pain    DUE TO LUNG ISSUE    SURGICAL HISTORY: Past Surgical History:  Procedure Laterality Date  . CATARACT EXTRACTION W/ INTRAOCULAR LENS  IMPLANT, BILATERAL    . COLONOSCOPY    . EYE SURGERY    . LYMPH GLAND EXCISION N/A 02/09/2017   Procedure: CERVICAL LYMPH NODE BIOPSY;  Surgeon: Robert Bellow, MD;  Location: [facility];  Service: General;  Laterality: N/A;  . PORTACATH PLACEMENT Right 02/23/2017   Procedure: INSERTION PORT-A-CATH;  Surgeon: Robert Bellow, MD;  Location: [facility];  Service: General;  Laterality: Right;  . PROSTATE SURGERY    . TONSILLECTOMY      SOCIAL HISTORY: Social History   Socioeconomic History  . Marital status: Married    Spouse name: Not on file  . Number of children: Not on file  . Years of education: Not on  file  . Highest education level: Not on file  Occupational History  . Not on file  Social Needs  . Financial resource strain: Not on file  . Food insecurity:    Worry: Not on file    Inability: Not on file  . Transportation needs:    Medical: Not on file    Non-medical: Not on file  Tobacco Use  . Smoking status: Former Smoker    Packs/day: 1.00    Years: 53.00    Pack years:  53.00    Types: Cigarettes    Last attempt to quit: 04/14/2017    Years since quitting: 0.4  . Smokeless tobacco: Never Used  . Tobacco comment: Quit November 2018  Substance and Sexual Activity  . Alcohol use: Yes    Comment: occassional  . Drug use: No  . Sexual activity: Yes  Lifestyle  . Physical activity:    Days per week: Not on file    Minutes per session: Not on file  . Stress: Not on file  Relationships  . Social connections:    Talks on phone: Not on file    Gets together: Not on file    Attends religious service: Not on file    Active member of club or organization: Not on file    Attends meetings of clubs or organizations: Not on file    Relationship status: Not on file  . Intimate partner violence:    Fear of current or ex partner: Not on file    Emotionally abused: Not on file    Physically abused: Not on file    Forced sexual activity: Not on file  Other Topics Concern  . Not on file  Social History Narrative  . Not on file    FAMILY HISTORY: Family History  Problem Relation Age of Onset  . Breast cancer Maternal Grandmother   . Dementia Mother   . Diabetes Father   . Stroke Father     ALLERGIES:  has No Known Allergies.  MEDICATIONS:  Current Outpatient Medications  Medication Sig Dispense Refill  . aspirin 325 MG tablet Take 650 mg by mouth daily.     . Chlorpheniramine-Phenylephrine (SINUS & ALLERGY) 4-10 MG tablet Take 1 tablet by mouth daily as needed.     . diphenhydrAMINE-zinc acetate (BENADRYL EXTRA STRENGTH) cream Apply 1 application topically 3 (three) times daily as needed for itching. (Patient not taking: Reported on 08/31/2017) 28 g 0  . fluticasone (FLONASE) 50 MCG/ACT nasal spray Place 1 spray into both nostrils daily. 16 g 2  . levothyroxine (SYNTHROID, LEVOTHROID) 25 MCG tablet TAKE 2 TABLETS BY MOUTH DAILY BEFORE BREAKFAST 120 tablet 0  . lidocaine-prilocaine (EMLA) cream Apply over port 1-2 hours prior to chemotherapy treatment.   Cover with plastic wrap. 30 g 1  . morphine 20 MG/5ML solution Take 2.5 mLs (10 mg total) by mouth every 6 (six) hours as needed for pain (before meal). (Patient not taking: Reported on 08/31/2017) 100 mL 0  . Multiple Vitamin (MULTI-VITAMINS) TABS Take 1 tablet by mouth daily.     [name] nystatin (MYCOSTATIN) 100000 UNIT/ML suspension Take 5 mLs (500,000 Units total) 4 (four) times daily by mouth. Swish and swallow (Patient not taking: Reported on 08/17/2017) 473 mL 0  . ondansetron (ZOFRAN) 8 MG tablet Take 1 tablet (8 mg total) by mouth every 8 (eight) hours as needed for nausea or vomiting. 60 tablet 1  . pantoprazole (PROTONIX) 40 MG tablet Take 1 tablet (40 mg  total) by mouth daily. 30 tablet 2  . prochlorperazine (COMPAZINE) 10 MG tablet Take 1 tablet (10 mg total) by mouth every 6 (six) hours as needed for nausea or vomiting. (Patient not taking: Reported on 08/17/2017) 60 tablet 2  . sucralfate (CARAFATE) 1 g tablet Take 1 tablet (1 g total) by mouth 3 (three) times daily. Dissolve in 2-3 tbsp warm water, swish and swallow (Patient not taking: Reported on 08/31/2017) 90 tablet 3  . triamcinolone ointment (KENALOG) 0.5 % Apply 1 application topically 3 (three) times daily as needed. 30 g 0   No current facility-administered medications for this visit.    Facility-Administered Medications Ordered in Other Visits  Medication Dose Route Frequency Provider Last Rate Last Dose  . sodium chloride flush (NS) 0.9 % injection 10 mL  10 mL Intravenous PRN [name], MD   10 mL at 04/03/17 9629      .  PHYSICAL EXAMINATION: ECOG PERFORMANCE STATUS: 1 - Symptomatic but completely ambulatory Vitals:   09/14/17 1027  BP: 123/82  Pulse: 61  Temp: (!) 96.6 F (35.9 C)  SpO2: 94%   Filed Weights   09/14/17 1027  Weight: 208 lb 3 oz (94.4 kg)  Physical Exam  Constitutional: He is oriented to person, place, and time and well-developed, well-nourished, and in no distress. No distress.  HENT:  Head:  Normocephalic and atraumatic.  Right Ear: External ear normal.  Left Ear: External ear normal.  Mouth/Throat: Oropharynx is clear and moist. No oropharyngeal exudate.  Eyes: Pupils are equal, round, and reactive to light. Conjunctivae and EOM are normal. Right eye exhibits no discharge. No scleral icterus.  Erythematous conjunctiva.   Neck: Normal range of motion. Neck supple.  Cardiovascular: Normal rate, regular rhythm and normal heart sounds.  No murmur heard. Pulmonary/Chest: Effort normal and breath sounds normal. No stridor. He has no wheezes. He has no rales.  Decreased sound bilaterally lower lobe.   Abdominal: Soft. Bowel sounds are normal. He exhibits no distension and no mass. There is no tenderness. There is no rebound.  Musculoskeletal: Normal range of motion. He exhibits no edema or tenderness.  Lymphadenopathy:    He has no cervical adenopathy.  Neurological: He is alert and oriented to person, place, and time. No cranial nerve deficit. Coordination normal.  Skin: Skin is warm and dry. No rash noted. No erythema.  Posterior right shoulder erythematous rash.   Psychiatric: Memory, affect and judgment normal.   LABORATORY DATA:  I have reviewed the data as listed Lab Results  Component Value Date   WBC 4.9 09/14/2017   HGB 13.2 09/14/2017   HCT 38.4 (L) 09/14/2017   MCV 92.1 09/14/2017   PLT 155 09/14/2017   Recent Labs    08/17/17 0837 08/31/17 0918 09/14/17 0924  NA 134* 132* 134*  K 4.2 4.3 4.1  CL 102 100* 102  CO2 24 24 24   GLUCOSE 94 81 86  BUN 23* 33* 27*  CREATININE 1.43* 1.54* 1.57*  CALCIUM 8.7* 8.7* 9.3  GFRNONAA 48* 44* 43*  GFRAA 56* 51* 50*  PROT 6.9 7.0 7.0  ALBUMIN 3.5 3.7 3.8  AST 37 48* 54*  ALT 33 37 47  ALKPHOS 53 55 58  BILITOT 0.7 0.5 0.7   TSH 3.152-->34.39  ASSESSMENT & PLAN:  Cancer Staging Adenocarcinoma of left lung, stage 3 (HCC) Staging form: Lung, AJCC 8th Edition - Clinical stage from 02/14/2017: Stage IIIB (cT2b,  cN3, cM0) - Signed by [name], MD on 02/14/2017  1. Encounter for antineoplastic immunotherapy   2. Adenocarcinoma of left lung, stage 3 ([place])   3. Hypothyroidism due to medication   4. Other fatigue   5. Elevated blood pressure reading   6. Skin rash   7. Chronic bacterial conjunctivitis of both eyes    # Tolerates immunotherapy well. Proceed today's Durvalumab treatment. . Repeat CT chest prior to next cycle of Durvalumab.  #Skin Rash, likely skin toxicity secondary to immunotherapy.  Stable Continue steroid cream as needed.  # Acquired hypothyroidism: continue titration of synthroid.  Given his age and history of CAD, plan increase Synthroid dose slowly. Advise patient to take 69mcg daily starting today. In one week, increase to 22mcg. TSH today is 87, slightly better comparing to 2 weeks ago.    # Elevated BP reading: improved after he stops using his sinus medication,  # Eye irritation: likely conjunctivitis secondary to immunotherapy vs allergy. Continue eye drops. Advise patient to follow up with ophthalmologist.   Return visit: Follow up in 2 weeks prior to next cycles of Durvalumab.  All questions were answered. The patient knows to call the clinic with any problems questions or concerns. Total face to face encounter time for this patient visit was 40 min. >50% of the time was  spent in counseling and coordination of care.   [name], MD, PhD Hematology Oncology [facility] Pager- [phone] 09/14/2017

## 2017-09-21 ENCOUNTER — Encounter: Payer: Self-pay | Admitting: *Deleted

## 2017-09-26 ENCOUNTER — Ambulatory Visit
Admission: RE | Admit: 2017-09-26 | Discharge: 2017-09-26 | Disposition: A | Discharge status: Home / Self Care | Payer: PPO | Source: Ambulatory Visit | Attending: Oncology | Admitting: Oncology

## 2017-09-26 DIAGNOSIS — I719 Aortic aneurysm of unspecified site, without rupture: Secondary | ICD-10-CM | POA: Insufficient documentation

## 2017-09-26 DIAGNOSIS — I700 Atherosclerosis of aorta: Secondary | ICD-10-CM | POA: Diagnosis not present

## 2017-09-26 DIAGNOSIS — I2510 Atherosclerotic heart disease of native coronary artery without angina pectoris: Secondary | ICD-10-CM | POA: Insufficient documentation

## 2017-09-26 DIAGNOSIS — I313 Pericardial effusion (noninflammatory): Secondary | ICD-10-CM | POA: Diagnosis not present

## 2017-09-26 DIAGNOSIS — R935 Abnormal findings on diagnostic imaging of other abdominal regions, including retroperitoneum: Secondary | ICD-10-CM | POA: Insufficient documentation

## 2017-09-26 DIAGNOSIS — J439 Emphysema, unspecified: Secondary | ICD-10-CM | POA: Insufficient documentation

## 2017-09-26 DIAGNOSIS — Z9889 Other specified postprocedural states: Secondary | ICD-10-CM | POA: Insufficient documentation

## 2017-09-26 DIAGNOSIS — C3492 Malignant neoplasm of unspecified part of left bronchus or lung: Secondary | ICD-10-CM | POA: Diagnosis not present

## 2017-09-26 MED ORDER — IOPAMIDOL (ISOVUE-300) INJECTION 61%
60.0000 mL | Freq: Once | INTRAVENOUS | Status: AC | PRN
  Administered 2017-09-26: 60 mL via INTRAVENOUS

## 2017-09-28 ENCOUNTER — Encounter: Payer: Self-pay | Admitting: Oncology

## 2017-09-28 ENCOUNTER — Inpatient Hospital Stay: Payer: PPO | Attending: Oncology

## 2017-09-28 ENCOUNTER — Inpatient Hospital Stay (HOSPITAL_BASED_OUTPATIENT_CLINIC_OR_DEPARTMENT_OTHER): Payer: PPO | Admitting: Oncology

## 2017-09-28 ENCOUNTER — Inpatient Hospital Stay: Payer: PPO

## 2017-09-28 ENCOUNTER — Other Ambulatory Visit: Payer: Self-pay

## 2017-09-28 VITALS — BP 152/85 | HR 56 | Temp 96.5°F | Wt 206.2 lb

## 2017-09-28 DIAGNOSIS — N400 Enlarged prostate without lower urinary tract symptoms: Secondary | ICD-10-CM

## 2017-09-28 DIAGNOSIS — C770 Secondary and unspecified malignant neoplasm of lymph nodes of head, face and neck: Secondary | ICD-10-CM | POA: Diagnosis not present

## 2017-09-28 DIAGNOSIS — I10 Essential (primary) hypertension: Secondary | ICD-10-CM

## 2017-09-28 DIAGNOSIS — Z5111 Encounter for antineoplastic chemotherapy: Secondary | ICD-10-CM | POA: Insufficient documentation

## 2017-09-28 DIAGNOSIS — Z923 Personal history of irradiation: Secondary | ICD-10-CM

## 2017-09-28 DIAGNOSIS — H10403 Unspecified chronic conjunctivitis, bilateral: Secondary | ICD-10-CM | POA: Diagnosis not present

## 2017-09-28 DIAGNOSIS — R05 Cough: Secondary | ICD-10-CM

## 2017-09-28 DIAGNOSIS — Z9181 History of falling: Secondary | ICD-10-CM | POA: Insufficient documentation

## 2017-09-28 DIAGNOSIS — R21 Rash and other nonspecific skin eruption: Secondary | ICD-10-CM | POA: Insufficient documentation

## 2017-09-28 DIAGNOSIS — R0609 Other forms of dyspnea: Secondary | ICD-10-CM | POA: Insufficient documentation

## 2017-09-28 DIAGNOSIS — Z7982 Long term (current) use of aspirin: Secondary | ICD-10-CM | POA: Insufficient documentation

## 2017-09-28 DIAGNOSIS — C3412 Malignant neoplasm of upper lobe, left bronchus or lung: Secondary | ICD-10-CM | POA: Insufficient documentation

## 2017-09-28 DIAGNOSIS — Z87891 Personal history of nicotine dependence: Secondary | ICD-10-CM | POA: Insufficient documentation

## 2017-09-28 DIAGNOSIS — Z5112 Encounter for antineoplastic immunotherapy: Secondary | ICD-10-CM

## 2017-09-28 DIAGNOSIS — Z85038 Personal history of other malignant neoplasm of large intestine: Secondary | ICD-10-CM | POA: Diagnosis not present

## 2017-09-28 DIAGNOSIS — F418 Other specified anxiety disorders: Secondary | ICD-10-CM | POA: Diagnosis not present

## 2017-09-28 DIAGNOSIS — Z9221 Personal history of antineoplastic chemotherapy: Secondary | ICD-10-CM | POA: Insufficient documentation

## 2017-09-28 DIAGNOSIS — Z79899 Other long term (current) drug therapy: Secondary | ICD-10-CM

## 2017-09-28 DIAGNOSIS — E032 Hypothyroidism due to medicaments and other exogenous substances: Secondary | ICD-10-CM | POA: Insufficient documentation

## 2017-09-28 DIAGNOSIS — R5383 Other fatigue: Secondary | ICD-10-CM | POA: Insufficient documentation

## 2017-09-28 DIAGNOSIS — C3492 Malignant neoplasm of unspecified part of left bronchus or lung: Secondary | ICD-10-CM

## 2017-09-28 LAB — CBC WITH DIFFERENTIAL/PLATELET
Basophils Absolute: 0 10*3/uL (ref 0–0.1)
Basophils Relative: 1 %
EOS PCT: 6 %
Eosinophils Absolute: 0.3 10*3/uL (ref 0–0.7)
HCT: 34.5 % — ABNORMAL LOW (ref 40.0–52.0)
Hemoglobin: 12.2 g/dL — ABNORMAL LOW (ref 13.0–18.0)
LYMPHS PCT: 16 %
Lymphs Abs: 0.8 10*3/uL — ABNORMAL LOW (ref 1.0–3.6)
MCH: 32.1 pg (ref 26.0–34.0)
MCHC: 35.3 g/dL (ref 32.0–36.0)
MCV: 91.1 fL (ref 80.0–100.0)
Monocytes Absolute: 0.6 10*3/uL (ref 0.2–1.0)
Monocytes Relative: 12 %
NEUTROS ABS: 3.2 10*3/uL (ref 1.4–6.5)
NEUTROS PCT: 65 %
PLATELETS: 159 10*3/uL (ref 150–440)
RBC: 3.78 MIL/uL — ABNORMAL LOW (ref 4.40–5.90)
RDW: 16.6 % — AB (ref 11.5–14.5)
WBC: 4.9 10*3/uL (ref 3.8–10.6)

## 2017-09-28 LAB — COMPREHENSIVE METABOLIC PANEL
ALT: 30 U/L (ref 17–63)
ANION GAP: 8 (ref 5–15)
AST: 39 U/L (ref 15–41)
Albumin: 3.8 g/dL (ref 3.5–5.0)
Alkaline Phosphatase: 56 U/L (ref 38–126)
BUN: 26 mg/dL — ABNORMAL HIGH (ref 6–20)
CHLORIDE: 102 mmol/L (ref 101–111)
CO2: 23 mmol/L (ref 22–32)
Calcium: 8.7 mg/dL — ABNORMAL LOW (ref 8.9–10.3)
Creatinine, Ser: 1.35 mg/dL — ABNORMAL HIGH (ref 0.61–1.24)
GFR, EST AFRICAN AMERICAN: 60 mL/min — AB (ref >60)
GFR, EST NON AFRICAN AMERICAN: 52 mL/min — AB (ref >60)
Glucose, Bld: 91 mg/dL (ref 65–99)
POTASSIUM: 4.3 mmol/L (ref 3.5–5.1)
SODIUM: 133 mmol/L — AB (ref 135–145)
Total Bilirubin: 0.9 mg/dL (ref 0.3–1.2)
Total Protein: 7 g/dL (ref 6.5–8.1)

## 2017-09-28 LAB — TSH: TSH: 77 u[IU]/mL — ABNORMAL HIGH (ref 0.350–4.500)

## 2017-09-28 MED ORDER — SODIUM CHLORIDE 0.9 % IV SOLN
Freq: Once | INTRAVENOUS | Status: AC
  Administered 2017-09-28: 10:00:00 via INTRAVENOUS
  Filled 2017-09-28: qty 1000

## 2017-09-28 MED ORDER — ALBUTEROL SULFATE HFA 108 (90 BASE) MCG/ACT IN AERS: 2.0000 | INHALATION_SPRAY | Freq: Four times a day (QID) | RESPIRATORY_TRACT | 2 refills | Status: DC | PRN

## 2017-09-28 MED ORDER — DURVALUMAB 500 MG/10ML IV SOLN
10.0000 mg/kg | Freq: Once | INTRAVENOUS | Status: AC
  Administered 2017-09-28: 860 mg via INTRAVENOUS
  Filled 2017-09-28: qty 10

## 2017-09-28 MED ORDER — TIOTROPIUM BROMIDE MONOHYDRATE 18 MCG IN CAPS: 18.0000 ug | ORAL_CAPSULE | Freq: Every day | RESPIRATORY_TRACT | 1 refills | Status: DC

## 2017-09-28 MED ORDER — SODIUM CHLORIDE 0.9% FLUSH
10.0000 mL | Freq: Once | INTRAVENOUS | Status: AC
  Administered 2017-09-28: 10 mL via INTRAVENOUS
  Filled 2017-09-28: qty 10

## 2017-09-28 MED ORDER — HEPARIN SOD (PORK) LOCK FLUSH 100 UNIT/ML IV SOLN
500.0000 [IU] | Freq: Once | INTRAVENOUS | Status: AC
  Administered 2017-09-28: 500 [IU] via INTRAVENOUS
  Filled 2017-09-28: qty 5

## 2017-09-28 NOTE — Progress Notes (Signed)
Patient here today for follow up.  Patient states no new concerns today  

## 2017-09-28 NOTE — Progress Notes (Signed)
Hematology/Oncology Follow Up Note [facility] [phone] [phone]   Patient Care Team: [name], MD as PCP - General (Family Medicine) [name], [name], MD (General Surgery) [name], MD as Consulting Physician (Oncology) [name], RN as Registered Nurse  Reason for Visit: follow up for treatment of lung cancer  HISTORY OF PRESENTING ILLNESS:  Marc Gray 72 y.o.  male who presents for treatment of Stage IIIB (cT2b, cN3, cM0)  Adenocarcinoma of lung.  # Patient is s/p [place] and taxol concurrent RT for treatment of Stage IIIB Lung cancer. He recieved additional lung boost RT Currently on Durvalumab maintenance.   # Colon CA was listed in his history, however reviewing his surgical pathology from colonoscopy on 04/15/2014, during which he had polyps removed and all are negative for cancer.patient is scheduled for colonoscopy screening this year. .   INTERVAL HISTORY Patient he presents for evaluation prior to immunotherapy.  Patient Synthroid dose has been slowly titrating up according to his TSH.  Currently he is taking 75 mcg of Synthroid.  Reports fatigue is slightly better, still quite fatigued.  Continues to have mild irritation.  Uses eyedrops off and on.  He has chronic cough, whitish or greenish sputum.  Mild shortness of breath with exertion.  Denies any chest pain.  Diarrhea, fever or chills.   Review of Systems  Constitutional: Positive for fatigue. Negative for appetite change, chills, diaphoresis, fever and unexpected weight change.  HENT:   Negative for hearing loss, lump/mass, mouth sores, nosebleeds and sore throat.   Eyes: Negative for eye problems and icterus.       Eye irritation.  Respiratory: Positive for cough. Negative for chest tightness, hemoptysis, shortness of breath and wheezing.        Chronic SOB with exertion.   Cardiovascular: Negative for chest pain, leg swelling and  palpitations.  Gastrointestinal: Negative for abdominal distention, abdominal pain, blood in stool, diarrhea, nausea, rectal pain and vomiting.  Endocrine: Negative for hot flashes.  Genitourinary: Negative for bladder incontinence, difficulty urinating, dysuria, frequency, hematuria and nocturia.   Musculoskeletal: Negative for arthralgias, back pain, flank pain, gait problem and myalgias.  Skin: Negative for itching and rash.  Neurological: Negative for dizziness, gait problem, headaches, light-headedness, numbness and seizures.  Hematological: Negative for adenopathy. Does not bruise/bleed easily.  Psychiatric/Behavioral: Negative for confusion, decreased concentration and depression. The patient is not nervous/anxious.     MEDICAL HISTORY:  Past Medical History:  Diagnosis Date  . Anxiety   . Arthritis    SHOULDER  . Cataract   . Colon cancer ([place]) 2015   Pt states during his colonoscopy he had cancerous polyps removed.   . Depression    SINCE DIAGNOSIS  . Dyspnea    DOE  . Enlarged prostate   . Hypertension   . Lung cancer [facility])    Aug 2018  . Pain    DUE TO LUNG ISSUE    SURGICAL HISTORY: Past Surgical History:  Procedure Laterality Date  . CATARACT EXTRACTION W/ INTRAOCULAR LENS  IMPLANT, BILATERAL    . COLONOSCOPY    . EYE SURGERY    . LYMPH GLAND EXCISION N/A 02/09/2017   Procedure: CERVICAL LYMPH NODE BIOPSY;  Surgeon: Robert Bellow, MD;  Location: [facility];  Service: General;  Laterality: N/A;  . PORTACATH PLACEMENT Right 02/23/2017   Procedure: INSERTION PORT-A-CATH;  Surgeon: Robert Bellow, MD;  Location: [facility];  Service: General;  Laterality: Right;  . PROSTATE  SURGERY    . TONSILLECTOMY      SOCIAL HISTORY: Social History   Socioeconomic History  . Marital status: Married    Spouse name: Not on file  . Number of children: Not on file  . Years of education: Not on file  . Highest education level: Not on file  Occupational History  . Not  on file  Social Needs  . Financial resource strain: Not on file  . Food insecurity:    Worry: Not on file    Inability: Not on file  . Transportation needs:    Medical: Not on file    Non-medical: Not on file  Tobacco Use  . Smoking status: Former Smoker    Packs/day: 1.00    Years: 53.00    Pack years: 53.00    Types: Cigarettes    Last attempt to quit: 04/14/2017    Years since quitting: 0.4  . Smokeless tobacco: Never Used  . Tobacco comment: Quit November 2018  Substance and Sexual Activity  . Alcohol use: Yes    Comment: occassional  . Drug use: No  . Sexual activity: Yes  Lifestyle  . Physical activity:    Days per week: Not on file    Minutes per session: Not on file  . Stress: Not on file  Relationships  . Social connections:    Talks on phone: Not on file    Gets together: Not on file    Attends religious service: Not on file    Active member of club or organization: Not on file    Attends meetings of clubs or organizations: Not on file    Relationship status: Not on file  . Intimate partner violence:    Fear of current or ex partner: Not on file    Emotionally abused: Not on file    Physically abused: Not on file    Forced sexual activity: Not on file  Other Topics Concern  . Not on file  Social History Narrative  . Not on file    FAMILY HISTORY: Family History  Problem Relation Age of Onset  . Breast cancer Maternal Grandmother   . Dementia Mother   . Diabetes Father   . Stroke Father     ALLERGIES:  has No Known Allergies.  MEDICATIONS:  Current Outpatient Medications  Medication Sig Dispense Refill  . aspirin 325 MG tablet Take 650 mg by mouth daily.     . Chlorpheniramine-Phenylephrine (SINUS & ALLERGY) 4-10 MG tablet Take 1 tablet by mouth daily as needed.     . diphenhydrAMINE-zinc acetate (BENADRYL EXTRA STRENGTH) cream Apply 1 application topically 3 (three) times daily as needed for itching. 28 g 0  . fluticasone (FLONASE) 50 MCG/ACT  nasal spray Place 1 spray into both nostrils daily. 16 g 2  . levothyroxine (SYNTHROID, LEVOTHROID) 25 MCG tablet TAKE 2 TABLETS BY MOUTH DAILY BEFORE BREAKFAST 120 tablet 0  . lidocaine-prilocaine (EMLA) cream Apply over port 1-2 hours prior to chemotherapy treatment.  Cover with plastic wrap. 30 g 1  . morphine 20 MG/5ML solution Take 2.5 mLs (10 mg total) by mouth every 6 (six) hours as needed for pain (before meal). 100 mL 0  . Multiple Vitamin (MULTI-VITAMINS) TABS Take 1 tablet by mouth daily.     [name] nystatin (MYCOSTATIN) 100000 UNIT/ML suspension Take 5 mLs (500,000 Units total) 4 (four) times daily by mouth. Swish and swallow 473 mL 0  . ondansetron (ZOFRAN) 8 MG tablet Take 1 tablet (8  mg total) by mouth every 8 (eight) hours as needed for nausea or vomiting. 60 tablet 1  . pantoprazole (PROTONIX) 40 MG tablet Take 1 tablet (40 mg total) by mouth daily. 30 tablet 2  . prochlorperazine (COMPAZINE) 10 MG tablet Take 1 tablet (10 mg total) by mouth every 6 (six) hours as needed for nausea or vomiting. 60 tablet 2  . sucralfate (CARAFATE) 1 g tablet Take 1 tablet (1 g total) by mouth 3 (three) times daily. Dissolve in 2-3 tbsp warm water, swish and swallow 90 tablet 3  . triamcinolone ointment (KENALOG) 0.5 % Apply 1 application topically 3 (three) times daily as needed. 30 g 0   No current facility-administered medications for this visit.    Facility-Administered Medications Ordered in Other Visits  Medication Dose Route Frequency Provider Last Rate Last Dose  . sodium chloride flush (NS) 0.9 % injection 10 mL  10 mL Intravenous PRN [name], MD   10 mL at 04/03/17 9509      .  PHYSICAL EXAMINATION: ECOG PERFORMANCE STATUS: 1 - Symptomatic but completely ambulatory Vitals:   09/28/17 1150  BP: (!) 152/85  Pulse: (!) 56  Temp: (!) 96.5 F (35.8 C)  SpO2: 96%   Filed Weights   09/28/17 1150  Weight: 206 lb 4 oz (93.6 kg)  Physical Exam  Constitutional: He is oriented to person,  place, and time and well-developed, well-nourished, and in no distress. No distress.  HENT:  Head: Normocephalic and atraumatic.  Right Ear: External ear normal.  Left Ear: External ear normal.  Nose: Nose normal.  Mouth/Throat: Oropharynx is clear and moist. No oropharyngeal exudate.  Eyes: Pupils are equal, round, and reactive to light. Conjunctivae and EOM are normal. Right eye exhibits no discharge. Left eye exhibits no discharge. No scleral icterus.  Erythematous conjunctiva.   Neck: Normal range of motion. Neck supple. No JVD present.  Cardiovascular: Normal rate, regular rhythm and normal heart sounds.  No murmur heard. Pulmonary/Chest: Effort normal and breath sounds normal. No stridor. No respiratory distress. He has no wheezes. He has no rales. He exhibits no tenderness.  Decreased sound bilaterally lower lobe.   Abdominal: Soft. Bowel sounds are normal. He exhibits no distension and no mass. There is no tenderness. There is no rebound.  Musculoskeletal: Normal range of motion. He exhibits no edema or tenderness.  Lymphadenopathy:    He has no cervical adenopathy.  Neurological: He is alert and oriented to person, place, and time. No cranial nerve deficit. He exhibits normal muscle tone. Coordination normal.  Skin: Skin is warm and dry. No rash noted. He is not diaphoretic. No erythema.  Posterior right shoulder erythematous rash.   Psychiatric: Memory, affect and judgment normal.   LABORATORY DATA:  I have reviewed the data as listed Lab Results  Component Value Date   WBC 4.9 09/28/2017   HGB 12.2 (L) 09/28/2017   HCT 34.5 (L) 09/28/2017   MCV 91.1 09/28/2017   PLT 159 09/28/2017   Recent Labs    08/31/17 0918 09/14/17 0924 09/28/17 0831  NA 132* 134* 133*  K 4.3 4.1 4.3  CL 100* 102 102  CO2 24 24 23   GLUCOSE 81 86 91  BUN 33* 27* 26*  CREATININE 1.54* 1.57* 1.35*  CALCIUM 8.7* 9.3 8.7*  GFRNONAA 44* 43* 52*  GFRAA 51* 50* 60*  PROT 7.0 7.0 7.0  ALBUMIN  3.7 3.8 3.8  AST 48* 54* 39  ALT 37 47 30  ALKPHOS 55 58  32  BILITOT 0.5 0.7 0.9   TSH 3.152-->34.39  ASSESSMENT & PLAN:  Cancer Staging Adenocarcinoma of left lung, stage 3 (HCC) Staging form: Lung, AJCC 8th Edition - Clinical stage from 02/14/2017: Stage IIIB (cT2b, cN3, cM0) - Signed by [name], MD on 02/14/2017  1. Adenocarcinoma of left lung, stage 3 ([place])   2. Hypothyroidism due to medication   3. Encounter for antineoplastic immunotherapy   4. Other fatigue   5. Chronic conjunctivitis of both eyes, unspecified chronic conjunctivitis type   6. Skin rash    # Tolerates immunotherapy well.  Proceed with today's durvalumab treatment.. . CT images were independently reviewed by me and discussed in detail with patient and his wife. Left upper lobe mass appears slightly smaller, with associated evolutionary changes of radiation therapy in the left hemithorax.  There is also a new progressive area of irregular consolidations in the left lower lobe most likely treatment related.  Stable pericardial effusion. Will discuss on tumor board.  #Skin Rash, likely skin toxicity secondary to immunotherapy.  Stable Continue steroid cream as needed.  # Acquired hypothyroidism: continue titration of synthroid.  TSH is 77 today advised patient to go up to 100  MCG of Synthroid daily.  Repeat TSH in 2 weeks.  # Fatigue: due to hypothyroidism # Eye irritation: likely immunotherapy related conjunctivitis plus pollen allergy. Continue eye drops. Advise patient to follow up with ophthalmologist.   Return visit: Follow up in 2 weeks prior to next cycles of Durvalumab.  All questions were answered. The patient knows to call the clinic with any problems questions or concerns.  [name], MD, PhD Hematology Oncology [facility] at [facility] Pager- [phone] 09/28/2017

## 2017-10-04 ENCOUNTER — Inpatient Hospital Stay: Payer: PPO

## 2017-10-16 ENCOUNTER — Encounter: Payer: Self-pay | Admitting: Oncology

## 2017-10-16 ENCOUNTER — Inpatient Hospital Stay: Payer: PPO

## 2017-10-16 ENCOUNTER — Inpatient Hospital Stay (HOSPITAL_BASED_OUTPATIENT_CLINIC_OR_DEPARTMENT_OTHER): Payer: PPO | Admitting: Oncology

## 2017-10-16 VITALS — BP 131/74 | HR 60 | Temp 97.4°F | Resp 18 | Ht 73.0 in | Wt 207.3 lb

## 2017-10-16 DIAGNOSIS — N400 Enlarged prostate without lower urinary tract symptoms: Secondary | ICD-10-CM

## 2017-10-16 DIAGNOSIS — E032 Hypothyroidism due to medicaments and other exogenous substances: Secondary | ICD-10-CM

## 2017-10-16 DIAGNOSIS — Z5112 Encounter for antineoplastic immunotherapy: Secondary | ICD-10-CM

## 2017-10-16 DIAGNOSIS — F418 Other specified anxiety disorders: Secondary | ICD-10-CM | POA: Diagnosis not present

## 2017-10-16 DIAGNOSIS — C770 Secondary and unspecified malignant neoplasm of lymph nodes of head, face and neck: Secondary | ICD-10-CM | POA: Diagnosis not present

## 2017-10-16 DIAGNOSIS — H10403 Unspecified chronic conjunctivitis, bilateral: Secondary | ICD-10-CM

## 2017-10-16 DIAGNOSIS — R5383 Other fatigue: Secondary | ICD-10-CM | POA: Diagnosis not present

## 2017-10-16 DIAGNOSIS — Z923 Personal history of irradiation: Secondary | ICD-10-CM

## 2017-10-16 DIAGNOSIS — R05 Cough: Secondary | ICD-10-CM

## 2017-10-16 DIAGNOSIS — Z9221 Personal history of antineoplastic chemotherapy: Secondary | ICD-10-CM | POA: Diagnosis not present

## 2017-10-16 DIAGNOSIS — C3492 Malignant neoplasm of unspecified part of left bronchus or lung: Secondary | ICD-10-CM

## 2017-10-16 DIAGNOSIS — C3412 Malignant neoplasm of upper lobe, left bronchus or lung: Secondary | ICD-10-CM

## 2017-10-16 DIAGNOSIS — R21 Rash and other nonspecific skin eruption: Secondary | ICD-10-CM

## 2017-10-16 DIAGNOSIS — Z87891 Personal history of nicotine dependence: Secondary | ICD-10-CM

## 2017-10-16 DIAGNOSIS — Z9181 History of falling: Secondary | ICD-10-CM

## 2017-10-16 DIAGNOSIS — Z5111 Encounter for antineoplastic chemotherapy: Secondary | ICD-10-CM | POA: Diagnosis not present

## 2017-10-16 DIAGNOSIS — Z79899 Other long term (current) drug therapy: Secondary | ICD-10-CM

## 2017-10-16 DIAGNOSIS — R0609 Other forms of dyspnea: Secondary | ICD-10-CM

## 2017-10-16 DIAGNOSIS — Z7982 Long term (current) use of aspirin: Secondary | ICD-10-CM

## 2017-10-16 DIAGNOSIS — Z85038 Personal history of other malignant neoplasm of large intestine: Secondary | ICD-10-CM

## 2017-10-16 LAB — COMPREHENSIVE METABOLIC PANEL
ALBUMIN: 3.7 g/dL (ref 3.5–5.0)
ALK PHOS: 54 U/L (ref 38–126)
ALT: 23 U/L (ref 17–63)
ANION GAP: 8 (ref 5–15)
AST: 31 U/L (ref 15–41)
BUN: 24 mg/dL — ABNORMAL HIGH (ref 6–20)
CO2: 25 mmol/L (ref 22–32)
Calcium: 8.8 mg/dL — ABNORMAL LOW (ref 8.9–10.3)
Chloride: 104 mmol/L (ref 101–111)
Creatinine, Ser: 1.46 mg/dL — ABNORMAL HIGH (ref 0.61–1.24)
GFR calc Af Amer: 54 mL/min — ABNORMAL LOW (ref >60)
GFR calc non Af Amer: 47 mL/min — ABNORMAL LOW (ref >60)
GLUCOSE: 97 mg/dL (ref 65–99)
Potassium: 4.1 mmol/L (ref 3.5–5.1)
SODIUM: 137 mmol/L (ref 135–145)
Total Bilirubin: 0.7 mg/dL (ref 0.3–1.2)
Total Protein: 6.8 g/dL (ref 6.5–8.1)

## 2017-10-16 LAB — CBC WITH DIFFERENTIAL/PLATELET
Basophils Absolute: 0 10*3/uL (ref 0–0.1)
Basophils Relative: 1 %
Eosinophils Absolute: 0.3 10*3/uL (ref 0–0.7)
Eosinophils Relative: 8 %
HEMATOCRIT: 34.8 % — AB (ref 40.0–52.0)
HEMOGLOBIN: 12.2 g/dL — AB (ref 13.0–18.0)
LYMPHS ABS: 0.8 10*3/uL — AB (ref 1.0–3.6)
LYMPHS PCT: 19 %
MCH: 32.7 pg (ref 26.0–34.0)
MCHC: 35.1 g/dL (ref 32.0–36.0)
MCV: 93.1 fL (ref 80.0–100.0)
Monocytes Absolute: 0.4 10*3/uL (ref 0.2–1.0)
Monocytes Relative: 10 %
NEUTROS ABS: 2.7 10*3/uL (ref 1.4–6.5)
Neutrophils Relative %: 62 %
Platelets: 169 10*3/uL (ref 150–440)
RBC: 3.73 MIL/uL — ABNORMAL LOW (ref 4.40–5.90)
RDW: 17.3 % — ABNORMAL HIGH (ref 11.5–14.5)
WBC: 4.3 10*3/uL (ref 3.8–10.6)

## 2017-10-16 LAB — TSH: TSH: 44.89 u[IU]/mL — ABNORMAL HIGH (ref 0.350–4.500)

## 2017-10-16 MED ORDER — SODIUM CHLORIDE 0.9 % IV SOLN
10.0000 mg/kg | Freq: Once | INTRAVENOUS | Status: AC
  Administered 2017-10-16: 860 mg via INTRAVENOUS
  Filled 2017-10-16: qty 10

## 2017-10-16 MED ORDER — SODIUM CHLORIDE 0.9 % IV SOLN
Freq: Once | INTRAVENOUS | Status: AC
  Administered 2017-10-16: 10:00:00 via INTRAVENOUS
  Filled 2017-10-16: qty 1000

## 2017-10-16 MED ORDER — HEPARIN SOD (PORK) LOCK FLUSH 100 UNIT/ML IV SOLN
500.0000 [IU] | Freq: Once | INTRAVENOUS | Status: AC
  Administered 2017-10-16: 500 [IU] via INTRAVENOUS

## 2017-10-16 MED ORDER — HEPARIN SOD (PORK) LOCK FLUSH 100 UNIT/ML IV SOLN: 500.0000 [IU] | Freq: Once | INTRAVENOUS | Status: DC | PRN

## 2017-10-16 MED ORDER — SODIUM CHLORIDE 0.9% FLUSH
10.0000 mL | INTRAVENOUS | Status: DC | PRN
  Filled 2017-10-16: qty 10

## 2017-10-16 MED ORDER — SODIUM CHLORIDE 0.9% FLUSH
10.0000 mL | INTRAVENOUS | Status: DC | PRN
  Administered 2017-10-16: 10 mL via INTRAVENOUS
  Filled 2017-10-16: qty 10

## 2017-10-16 MED ORDER — LEVOTHYROXINE SODIUM 25 MCG PO TABS: 125.0000 ug | ORAL_TABLET | Freq: Every day | ORAL | 0 refills | Status: DC

## 2017-10-16 NOTE — Progress Notes (Signed)
No new changes noted today 

## 2017-10-16 NOTE — Progress Notes (Signed)
Hematology/Oncology Follow Up Note [facility] Telephone:([phone] [phone]   Patient Care Team: Marc Pink, MD as PCP - General (Family Medicine) Marc Gray, Marc Gleason, MD (General Surgery) Marc Server, MD as Consulting Physician (Oncology) [name], RN as Registered Nurse  Reason for Visit: follow up for immunotherapy treatment of lung cancer  HISTORY OF PRESENTING ILLNESS:  Marc Gray 72 y.o.  male who presents for treatment of Stage IIIB (cT2b, cN3, cM0)  Adenocarcinoma of lung.  # Patient is s/p [place] and taxol concurrent RT for treatment of Stage IIIB Lung cancer. He recieved additional lung boost RT Currently on Durvalumab maintenance.   # Colon CA was listed in his history, however reviewing his surgical pathology from colonoscopy on 04/15/2014, during which he had polyps removed and all are negative for cancer.patient is scheduled for colonoscopy screening this year. .   INTERVAL HISTORY Patient he presents for evaluation prior to immunotherapy.  Patient Synthroid dose has been slowly titrating up according to his TSH.  Currently he is taking 182mcg of Synthroid. Came back from a trip in [place].  Fatigue is better. Had a mechanical fall in [place], landed on butt.  Continue to have eye irritation, uses steroid eyedrops off and on.  He has chronic cough which appears to be slightly better. Denies SOB.   Review of Systems  Constitutional: Positive for fatigue. Negative for appetite change, chills, diaphoresis, fever and unexpected weight change.  HENT:   Negative for hearing loss, lump/mass, mouth sores, nosebleeds and sore throat.   Eyes: Negative for eye problems and icterus.       Eye irritation.  Respiratory: Positive for cough. Negative for chest tightness, hemoptysis, shortness of breath and wheezing.        Chronic SOB with exertion.   Cardiovascular: Negative for chest pain, leg swelling and palpitations.    Gastrointestinal: Negative for abdominal distention, abdominal pain, blood in stool, diarrhea, nausea, rectal pain and vomiting.  Endocrine: Negative for hot flashes.  Genitourinary: Negative for bladder incontinence, difficulty urinating, dysuria, frequency, hematuria and nocturia.   Musculoskeletal: Negative for arthralgias, back pain, flank pain, gait problem and myalgias.  Skin: Negative for itching and rash.  Neurological: Negative for dizziness, gait problem, headaches, light-headedness, numbness and seizures.  Hematological: Negative for adenopathy. Does not bruise/bleed easily.  Psychiatric/Behavioral: Negative for confusion, decreased concentration and depression. The patient is not nervous/anxious.     MEDICAL HISTORY:  Past Medical History:  Diagnosis Date  . Anxiety   . Arthritis    SHOULDER  . Cataract   . Colon cancer ([place]) 2015   Pt states during his colonoscopy he had cancerous polyps removed.   . Depression    SINCE DIAGNOSIS  . Dyspnea    DOE  . Enlarged prostate   . Hypertension   . Lung cancer [facility])    Aug 2018  . Pain    DUE TO LUNG ISSUE    SURGICAL HISTORY: Past Surgical History:  Procedure Laterality Date  . CATARACT EXTRACTION W/ INTRAOCULAR LENS  IMPLANT, BILATERAL    . COLONOSCOPY    . EYE SURGERY    . LYMPH GLAND EXCISION N/A 02/09/2017   Procedure: CERVICAL LYMPH NODE BIOPSY;  Surgeon: Marc Bellow, MD;  Location: [facility];  Service: General;  Laterality: N/A;  . PORTACATH PLACEMENT Right 02/23/2017   Procedure: INSERTION PORT-A-CATH;  Surgeon: Marc Bellow, MD;  Location: [facility];  Service: General;  Laterality: Right;  . PROSTATE SURGERY    .  TONSILLECTOMY      SOCIAL HISTORY: Social History   Socioeconomic History  . Marital status: Married    Spouse name: Not on file  . Number of children: Not on file  . Years of education: Not on file  . Highest education level: Not on file  Occupational History  . Not on file   Social Needs  . Financial resource strain: Not on file  . Food insecurity:    Worry: Not on file    Inability: Not on file  . Transportation needs:    Medical: Not on file    Non-medical: Not on file  Tobacco Use  . Smoking status: Former Smoker    Packs/day: 1.00    Years: 53.00    Pack years: 53.00    Types: Cigarettes    Last attempt to quit: 04/14/2017    Years since quitting: 0.5  . Smokeless tobacco: Never Used  . Tobacco comment: Quit November 2018  Substance and Sexual Activity  . Alcohol use: Yes    Comment: occassional  . Drug use: No  . Sexual activity: Yes  Lifestyle  . Physical activity:    Days per week: Not on file    Minutes per session: Not on file  . Stress: Not on file  Relationships  . Social connections:    Talks on phone: Not on file    Gets together: Not on file    Attends religious service: Not on file    Active member of club or organization: Not on file    Attends meetings of clubs or organizations: Not on file    Relationship status: Not on file  . Intimate partner violence:    Fear of current or ex partner: Not on file    Emotionally abused: Not on file    Physically abused: Not on file    Forced sexual activity: Not on file  Other Topics Concern  . Not on file  Social History Narrative  . Not on file    FAMILY HISTORY: Family History  Problem Relation Age of Onset  . Breast cancer Maternal Grandmother   . Dementia Mother   . Diabetes Father   . Stroke Father     ALLERGIES:  has No Known Allergies.  MEDICATIONS:  Current Outpatient Medications  Medication Sig Dispense Refill  . aspirin 325 MG tablet Take 650 mg by mouth daily.     [name] levothyroxine (SYNTHROID, LEVOTHROID) 25 MCG tablet TAKE 2 TABLETS BY MOUTH DAILY BEFORE BREAKFAST 120 tablet 0  . Multiple Vitamin (MULTI-VITAMINS) TABS Take 1 tablet by mouth daily.     . pantoprazole (PROTONIX) 40 MG tablet Take 1 tablet (40 mg total) by mouth daily. 30 tablet 2  .  tiotropium (SPIRIVA HANDIHALER) 18 MCG inhalation capsule Place 1 capsule (18 mcg total) into inhaler and inhale daily. 30 capsule 1  . albuterol (PROVENTIL HFA;VENTOLIN HFA) 108 (90 Base) MCG/ACT inhaler Inhale 2 puffs into the lungs every 6 (six) hours as needed for wheezing or shortness of breath. (Patient not taking: Reported on 10/16/2017) 1 Inhaler 2  . Chlorpheniramine-Phenylephrine (SINUS & ALLERGY) 4-10 MG tablet Take 1 tablet by mouth daily as needed.     . diphenhydrAMINE-zinc acetate (BENADRYL EXTRA STRENGTH) cream Apply 1 application topically 3 (three) times daily as needed for itching. (Patient not taking: Reported on 10/16/2017) 28 g 0  . fluticasone (FLONASE) 50 MCG/ACT nasal spray Place 1 spray into both nostrils daily. (Patient not taking: Reported on 10/16/2017) 16  g 2  . lidocaine-prilocaine (EMLA) cream Apply over port 1-2 hours prior to chemotherapy treatment.  Cover with plastic wrap. (Patient not taking: Reported on 10/16/2017) 30 g 1  . morphine 20 MG/5ML solution Take 2.5 mLs (10 mg total) by mouth every 6 (six) hours as needed for pain (before meal). (Patient not taking: Reported on 10/16/2017) 100 mL 0  . ondansetron (ZOFRAN) 8 MG tablet Take 1 tablet (8 mg total) by mouth every 8 (eight) hours as needed for nausea or vomiting. (Patient not taking: Reported on 10/16/2017) 60 tablet 1  . prochlorperazine (COMPAZINE) 10 MG tablet Take 1 tablet (10 mg total) by mouth every 6 (six) hours as needed for nausea or vomiting. (Patient not taking: Reported on 10/16/2017) 60 tablet 2  . triamcinolone ointment (KENALOG) 0.5 % Apply 1 application topically 3 (three) times daily as needed. (Patient not taking: Reported on 10/16/2017) 30 g 0   No current facility-administered medications for this visit.    Facility-Administered Medications Ordered in Other Visits  Medication Dose Route Frequency Provider Last Rate Last Dose  . sodium chloride flush (NS) 0.9 % injection 10 mL  10 mL Intravenous  PRN Marc Server, MD   10 mL at 04/03/17 6295      .  PHYSICAL EXAMINATION: ECOG PERFORMANCE STATUS: 1 - Symptomatic but completely ambulatory Vitals:   10/16/17 0916  BP: 131/74  Pulse: 60  Resp: 18  Temp: (!) 97.4 F (36.3 C)  SpO2: 99%   Filed Weights   10/16/17 0916  Weight: 207 lb 4.8 oz (94 kg)  Physical Exam  Constitutional: He is oriented to person, place, and time and well-developed, well-nourished, and in no distress. No distress.  HENT:  Head: Normocephalic and atraumatic.  Right Ear: External ear normal.  Left Ear: External ear normal.  Nose: Nose normal.  Mouth/Throat: Oropharynx is clear and moist. No oropharyngeal exudate.  Eyes: Pupils are equal, round, and reactive to light. Conjunctivae and EOM are normal. Right eye exhibits no discharge. Left eye exhibits no discharge. No scleral icterus.  Erythematous conjunctiva.   Neck: Normal range of motion. Neck supple. No JVD present.  Cardiovascular: Normal rate, regular rhythm and normal heart sounds.  No murmur heard. Pulmonary/Chest: Effort normal and breath sounds normal. No stridor. No respiratory distress. He has no wheezes. He has no rales. He exhibits no tenderness.  Decreased sound bilaterally lower lobe.   Abdominal: Soft. Bowel sounds are normal. He exhibits no distension and no mass. There is no tenderness. There is no rebound.  Musculoskeletal: Normal range of motion. He exhibits no edema or tenderness.  Lymphadenopathy:    He has no cervical adenopathy.  Neurological: He is alert and oriented to person, place, and time. No cranial nerve deficit. He exhibits normal muscle tone. Coordination normal.  Skin: Skin is warm and dry. No rash noted. He is not diaphoretic. No erythema.  Posterior right shoulder erythematous rash.   Psychiatric: Memory, affect and judgment normal.   LABORATORY DATA:  I have reviewed the data as listed Lab Results  Component Value Date   WBC 4.3 10/16/2017   HGB 12.2 (L)  10/16/2017   HCT 34.8 (L) 10/16/2017   MCV 93.1 10/16/2017   PLT 169 10/16/2017   Recent Labs    09/14/17 0924 09/28/17 0831 10/16/17 0902  NA 134* 133* 137  K 4.1 4.3 4.1  CL 102 102 104  CO2 24 23 25   GLUCOSE 86 91 97  BUN 27* 26* 24*  CREATININE 1.57* 1.35* 1.46*  CALCIUM 9.3 8.7* 8.8*  GFRNONAA 43* 52* 47*  GFRAA 50* 60* 54*  PROT 7.0 7.0 6.8  ALBUMIN 3.8 3.8 3.7  AST 54* 39 31  ALT 47 30 23  ALKPHOS 58 56 54  BILITOT 0.7 0.9 0.7     ASSESSMENT & PLAN:  Cancer Staging Adenocarcinoma of left lung, stage 3 (HCC) Staging form: Lung, AJCC 8th Edition - Clinical stage from 02/14/2017: Stage IIIB (cT2b, cN3, cM0) - Signed by Marc Server, MD on 02/14/2017  1. Adenocarcinoma of left lung, stage 3 ([place])   2. Hypothyroidism due to medication   3. Encounter for antineoplastic immunotherapy   4. Chronic conjunctivitis of both eyes, unspecified chronic conjunctivitis type   5. Skin rash    # Tolerates immunotherapy well.  Proceed with today's Durvalumab treatment.  CT scan was discussed at tumor board and changes consistent with radiation changes. Discussed with patient and wife.   #Skin Rash, likely skin toxicity secondary to immunotherapy. Stable, continue steroid cream PRN  # Acquired hypothyroidism: continue titration of synthroid.  TSH trended down to 44 today. Will call patient to increase Synthroid to 141mcg daily.    # Fatigue: due to hypothyroidism, improved.  # Eye irritation: likely immunotherapy related conjunctivitis plus pollen allergy. Continue eye drops. Advise patient to follow up with ophthalmologist.   Return visit: Follow up in 2 weeks prior to next cycles of Durvalumab.  All questions were answered. The patient knows to call the clinic with any problems questions or concerns.  Marc Server, MD, PhD Hematology Oncology [facility] at [facility] Pager- [phone] 10/16/2017

## 2017-10-17 ENCOUNTER — Other Ambulatory Visit: Payer: Self-pay | Admitting: *Deleted

## 2017-10-17 NOTE — Telephone Encounter (Signed)
Open in error

## 2017-10-30 ENCOUNTER — Inpatient Hospital Stay: Payer: PPO | Attending: Oncology

## 2017-10-30 ENCOUNTER — Other Ambulatory Visit: Payer: Self-pay

## 2017-10-30 ENCOUNTER — Inpatient Hospital Stay: Payer: PPO

## 2017-10-30 ENCOUNTER — Encounter: Payer: Self-pay | Admitting: Oncology

## 2017-10-30 ENCOUNTER — Inpatient Hospital Stay (HOSPITAL_BASED_OUTPATIENT_CLINIC_OR_DEPARTMENT_OTHER): Payer: PPO | Admitting: Oncology

## 2017-10-30 VITALS — BP 112/72 | HR 66 | Temp 96.7°F | Ht 73.0 in | Wt 207.4 lb

## 2017-10-30 DIAGNOSIS — R0602 Shortness of breath: Secondary | ICD-10-CM | POA: Insufficient documentation

## 2017-10-30 DIAGNOSIS — H5789 Other specified disorders of eye and adnexa: Secondary | ICD-10-CM | POA: Insufficient documentation

## 2017-10-30 DIAGNOSIS — N183 Chronic kidney disease, stage 3 (moderate): Secondary | ICD-10-CM | POA: Insufficient documentation

## 2017-10-30 DIAGNOSIS — E032 Hypothyroidism due to medicaments and other exogenous substances: Secondary | ICD-10-CM

## 2017-10-30 DIAGNOSIS — R05 Cough: Secondary | ICD-10-CM | POA: Insufficient documentation

## 2017-10-30 DIAGNOSIS — R531 Weakness: Secondary | ICD-10-CM | POA: Diagnosis not present

## 2017-10-30 DIAGNOSIS — R42 Dizziness and giddiness: Secondary | ICD-10-CM | POA: Insufficient documentation

## 2017-10-30 DIAGNOSIS — R21 Rash and other nonspecific skin eruption: Secondary | ICD-10-CM | POA: Diagnosis not present

## 2017-10-30 DIAGNOSIS — H04129 Dry eye syndrome of unspecified lacrimal gland: Secondary | ICD-10-CM | POA: Diagnosis not present

## 2017-10-30 DIAGNOSIS — M1990 Unspecified osteoarthritis, unspecified site: Secondary | ICD-10-CM

## 2017-10-30 DIAGNOSIS — H10403 Unspecified chronic conjunctivitis, bilateral: Secondary | ICD-10-CM

## 2017-10-30 DIAGNOSIS — C770 Secondary and unspecified malignant neoplasm of lymph nodes of head, face and neck: Secondary | ICD-10-CM | POA: Insufficient documentation

## 2017-10-30 DIAGNOSIS — R5383 Other fatigue: Secondary | ICD-10-CM | POA: Insufficient documentation

## 2017-10-30 DIAGNOSIS — I10 Essential (primary) hypertension: Secondary | ICD-10-CM

## 2017-10-30 DIAGNOSIS — C3412 Malignant neoplasm of upper lobe, left bronchus or lung: Secondary | ICD-10-CM | POA: Diagnosis not present

## 2017-10-30 DIAGNOSIS — Z79899 Other long term (current) drug therapy: Secondary | ICD-10-CM

## 2017-10-30 DIAGNOSIS — J449 Chronic obstructive pulmonary disease, unspecified: Secondary | ICD-10-CM

## 2017-10-30 DIAGNOSIS — Z87891 Personal history of nicotine dependence: Secondary | ICD-10-CM

## 2017-10-30 DIAGNOSIS — D631 Anemia in chronic kidney disease: Secondary | ICD-10-CM | POA: Diagnosis not present

## 2017-10-30 DIAGNOSIS — Z923 Personal history of irradiation: Secondary | ICD-10-CM | POA: Diagnosis not present

## 2017-10-30 DIAGNOSIS — F329 Major depressive disorder, single episode, unspecified: Secondary | ICD-10-CM | POA: Insufficient documentation

## 2017-10-30 DIAGNOSIS — I700 Atherosclerosis of aorta: Secondary | ICD-10-CM | POA: Diagnosis not present

## 2017-10-30 DIAGNOSIS — E860 Dehydration: Secondary | ICD-10-CM | POA: Insufficient documentation

## 2017-10-30 DIAGNOSIS — N400 Enlarged prostate without lower urinary tract symptoms: Secondary | ICD-10-CM | POA: Insufficient documentation

## 2017-10-30 DIAGNOSIS — Z9221 Personal history of antineoplastic chemotherapy: Secondary | ICD-10-CM | POA: Diagnosis not present

## 2017-10-30 DIAGNOSIS — R112 Nausea with vomiting, unspecified: Secondary | ICD-10-CM | POA: Diagnosis not present

## 2017-10-30 DIAGNOSIS — Z7982 Long term (current) use of aspirin: Secondary | ICD-10-CM | POA: Insufficient documentation

## 2017-10-30 DIAGNOSIS — Z5112 Encounter for antineoplastic immunotherapy: Secondary | ICD-10-CM | POA: Diagnosis not present

## 2017-10-30 DIAGNOSIS — K5900 Constipation, unspecified: Secondary | ICD-10-CM | POA: Insufficient documentation

## 2017-10-30 DIAGNOSIS — C3492 Malignant neoplasm of unspecified part of left bronchus or lung: Secondary | ICD-10-CM

## 2017-10-30 LAB — COMPREHENSIVE METABOLIC PANEL
ALBUMIN: 3.8 g/dL (ref 3.5–5.0)
ALK PHOS: 55 U/L (ref 38–126)
ALT: 16 U/L — AB (ref 17–63)
ANION GAP: 8 (ref 5–15)
AST: 24 U/L (ref 15–41)
BILIRUBIN TOTAL: 0.6 mg/dL (ref 0.3–1.2)
BUN: 23 mg/dL — ABNORMAL HIGH (ref 6–20)
CO2: 23 mmol/L (ref 22–32)
CREATININE: 1.5 mg/dL — AB (ref 0.61–1.24)
Calcium: 8.7 mg/dL — ABNORMAL LOW (ref 8.9–10.3)
Chloride: 106 mmol/L (ref 101–111)
GFR calc non Af Amer: 45 mL/min — ABNORMAL LOW (ref >60)
GFR, EST AFRICAN AMERICAN: 53 mL/min — AB (ref >60)
GLUCOSE: 105 mg/dL — AB (ref 65–99)
Potassium: 3.9 mmol/L (ref 3.5–5.1)
Sodium: 137 mmol/L (ref 135–145)
TOTAL PROTEIN: 6.7 g/dL (ref 6.5–8.1)

## 2017-10-30 LAB — CBC WITH DIFFERENTIAL/PLATELET
BASOS ABS: 0 10*3/uL (ref 0–0.1)
Basophils Relative: 1 %
EOS ABS: 0.4 10*3/uL (ref 0–0.7)
Eosinophils Relative: 9 %
HEMATOCRIT: 35.7 % — AB (ref 40.0–52.0)
Hemoglobin: 12.4 g/dL — ABNORMAL LOW (ref 13.0–18.0)
Lymphocytes Relative: 18 %
Lymphs Abs: 0.8 10*3/uL — ABNORMAL LOW (ref 1.0–3.6)
MCH: 32.7 pg (ref 26.0–34.0)
MCHC: 34.7 g/dL (ref 32.0–36.0)
MCV: 94.3 fL (ref 80.0–100.0)
MONO ABS: 0.5 10*3/uL (ref 0.2–1.0)
Monocytes Relative: 11 %
NEUTROS ABS: 2.7 10*3/uL (ref 1.4–6.5)
Neutrophils Relative %: 61 %
PLATELETS: 165 10*3/uL (ref 150–440)
RBC: 3.79 MIL/uL — ABNORMAL LOW (ref 4.40–5.90)
RDW: 17 % — AB (ref 11.5–14.5)
WBC: 4.4 10*3/uL (ref 3.8–10.6)

## 2017-10-30 LAB — TSH: TSH: 21.447 u[IU]/mL — ABNORMAL HIGH (ref 0.350–4.500)

## 2017-10-30 MED ORDER — HEPARIN SOD (PORK) LOCK FLUSH 100 UNIT/ML IV SOLN
500.0000 [IU] | Freq: Once | INTRAVENOUS | Status: AC
  Administered 2017-10-30: 500 [IU] via INTRAVENOUS
  Filled 2017-10-30: qty 5

## 2017-10-30 MED ORDER — SODIUM CHLORIDE 0.9 % IV SOLN
10.0000 mg/kg | Freq: Once | INTRAVENOUS | Status: AC
  Administered 2017-10-30: 860 mg via INTRAVENOUS
  Filled 2017-10-30: qty 10

## 2017-10-30 MED ORDER — LEVOTHYROXINE SODIUM 137 MCG PO TABS: 137.0000 ug | ORAL_TABLET | Freq: Every day | ORAL | 1 refills | Status: DC

## 2017-10-30 MED ORDER — SODIUM CHLORIDE 0.9 % IV SOLN
Freq: Once | INTRAVENOUS | Status: AC
Start: 2017-10-30 — End: 2017-10-30
  Administered 2017-10-30: 10:00:00 via INTRAVENOUS
  Filled 2017-10-30: qty 1000

## 2017-10-30 MED ORDER — SODIUM CHLORIDE 0.9% FLUSH
10.0000 mL | INTRAVENOUS | Status: DC | PRN
  Administered 2017-10-30: 10 mL via INTRAVENOUS
  Filled 2017-10-30: qty 10

## 2017-10-30 NOTE — Progress Notes (Signed)
Patient here today for follow up.   

## 2017-10-30 NOTE — Progress Notes (Signed)
Hematology/Oncology Follow Up Note [facility] [phone] Fax:([phone]   Patient Care Team: [name], MD as PCP - General (Family Medicine) [name], [name], MD (General Surgery) [name], MD as Consulting Physician (Oncology) [name], RN as Registered Nurse  Reason for Visit: follow up for immunotherapy treatment of lung cancer  HISTORY OF PRESENTING ILLNESS:  Marc Gray 72 y.o.  male who presents for treatment of Stage IIIB (cT2b, cN3, cM0)  Adenocarcinoma of lung.  # Patient is s/p [place] and taxol concurrent RT for treatment of Stage IIIB Lung cancer. He recieved additional lung boost RT Currently on Durvalumab maintenance.   # Colon CA was listed in his history, however reviewing his surgical pathology from colonoscopy on 04/15/2014, during which he had polyps removed and all are negative for cancer.patient is scheduled for colonoscopy screening this year.  # 5/1/ 2019 CT scan was discussed at tumor board and changes consistent with radiation changes  INTERVAL HISTORY Patient he presents for evaluation prior to immunotherapy.  Patient Synthroid dose has been slowly titrating up to 155mcg according to his TSH.  \ He continues to have fatigue which sometimes is worse after exertion, ie, when he carries his grandchild around.  Fatigue is worse. .  Continue to have eye irritation, at baseline, controlled with eye drops.  Chronic cough is at baseline.  SOB is a little worse, especially after exertion. .    Review of Systems  Constitutional: Positive for fatigue. Negative for appetite change, chills, diaphoresis, fever and unexpected weight change.  HENT:   Negative for hearing loss, lump/mass, mouth sores, nosebleeds and sore throat.   Eyes: Negative for eye problems and icterus.       Eye irritation.  Respiratory: Positive for cough and shortness of breath. Negative for chest tightness, hemoptysis and  wheezing.        Chronic SOB with exertion.   Cardiovascular: Negative for chest pain, leg swelling and palpitations.  Gastrointestinal: Negative for abdominal distention, abdominal pain, blood in stool, diarrhea, nausea, rectal pain and vomiting.  Endocrine: Negative for hot flashes.  Genitourinary: Negative for bladder incontinence, difficulty urinating, dysuria, frequency, hematuria and nocturia.   Musculoskeletal: Negative for arthralgias, back pain, flank pain, gait problem and myalgias.  Skin: Negative for itching and rash.  Neurological: Negative for dizziness, gait problem, headaches, light-headedness, numbness and seizures.  Hematological: Negative for adenopathy. Does not bruise/bleed easily.  Psychiatric/Behavioral: Negative for confusion, decreased concentration and depression. The patient is not nervous/anxious.     MEDICAL HISTORY:  Past Medical History:  Diagnosis Date  . Anxiety   . Arthritis    SHOULDER  . Cataract   . Colon cancer ([place]) 2015   Pt states during his colonoscopy he had cancerous polyps removed.   . Depression    SINCE DIAGNOSIS  . Dyspnea    DOE  . Enlarged prostate   . Hypertension   . Lung cancer [facility])    Aug 2018  . Pain    DUE TO LUNG ISSUE    SURGICAL HISTORY: Past Surgical History:  Procedure Laterality Date  . CATARACT EXTRACTION W/ INTRAOCULAR LENS  IMPLANT, BILATERAL    . COLONOSCOPY    . EYE SURGERY    . LYMPH GLAND EXCISION N/A 02/09/2017   Procedure: CERVICAL LYMPH NODE BIOPSY;  Surgeon: Robert Bellow, MD;  Location: [facility];  Service: General;  Laterality: N/A;  . PORTACATH PLACEMENT Right 02/23/2017   Procedure: INSERTION PORT-A-CATH;  Surgeon:  Robert Bellow, MD;  Location: [facility];  Service: General;  Laterality: Right;  . PROSTATE SURGERY    . TONSILLECTOMY      SOCIAL HISTORY: Social History   Socioeconomic History  . Marital status: Married    Spouse name: Not on file  . Number of children: Not on file    . Years of education: Not on file  . Highest education level: Not on file  Occupational History  . Not on file  Social Needs  . Financial resource strain: Not on file  . Food insecurity:    Worry: Not on file    Inability: Not on file  . Transportation needs:    Medical: Not on file    Non-medical: Not on file  Tobacco Use  . Smoking status: Former Smoker    Packs/day: 1.00    Years: 53.00    Pack years: 53.00    Types: Cigarettes    Last attempt to quit: 04/14/2017    Years since quitting: 0.5  . Smokeless tobacco: Never Used  . Tobacco comment: Quit November 2018  Substance and Sexual Activity  . Alcohol use: Yes    Comment: occassional  . Drug use: No  . Sexual activity: Yes  Lifestyle  . Physical activity:    Days per week: Not on file    Minutes per session: Not on file  . Stress: Not on file  Relationships  . Social connections:    Talks on phone: Not on file    Gets together: Not on file    Attends religious service: Not on file    Active member of club or organization: Not on file    Attends meetings of clubs or organizations: Not on file    Relationship status: Not on file  . Intimate partner violence:    Fear of current or ex partner: Not on file    Emotionally abused: Not on file    Physically abused: Not on file    Forced sexual activity: Not on file  Other Topics Concern  . Not on file  Social History Narrative  . Not on file    FAMILY HISTORY: Family History  Problem Relation Age of Onset  . Breast cancer Maternal Grandmother   . Dementia Mother   . Diabetes Father   . Stroke Father     ALLERGIES:  has No Known Allergies.  MEDICATIONS:  Current Outpatient Medications  Medication Sig Dispense Refill  . albuterol (PROVENTIL HFA;VENTOLIN HFA) 108 (90 Base) MCG/ACT inhaler Inhale 2 puffs into the lungs every 6 (six) hours as needed for wheezing or shortness of breath. 1 Inhaler 2  . aspirin 325 MG tablet Take 650 mg by mouth daily.     .  Chlorpheniramine-Phenylephrine (SINUS & ALLERGY) 4-10 MG tablet Take 1 tablet by mouth daily as needed.     . diphenhydrAMINE-zinc acetate (BENADRYL EXTRA STRENGTH) cream Apply 1 application topically 3 (three) times daily as needed for itching. 28 g 0  . fluticasone (FLONASE) 50 MCG/ACT nasal spray Place 1 spray into both nostrils daily. 16 g 2  . levothyroxine (SYNTHROID, LEVOTHROID) 25 MCG tablet Take 5 tablets (125 mcg total) by mouth daily before breakfast. 120 tablet 0  . lidocaine-prilocaine (EMLA) cream Apply over port 1-2 hours prior to chemotherapy treatment.  Cover with plastic wrap. 30 g 1  . morphine 20 MG/5ML solution Take 2.5 mLs (10 mg total) by mouth every 6 (six) hours as needed for pain (before meal). 100  mL 0  . Multiple Vitamin (MULTI-VITAMINS) TABS Take 1 tablet by mouth daily.     . ondansetron (ZOFRAN) 8 MG tablet Take 1 tablet (8 mg total) by mouth every 8 (eight) hours as needed for nausea or vomiting. 60 tablet 1  . pantoprazole (PROTONIX) 40 MG tablet Take 1 tablet (40 mg total) by mouth daily. 30 tablet 2  . prochlorperazine (COMPAZINE) 10 MG tablet Take 1 tablet (10 mg total) by mouth every 6 (six) hours as needed for nausea or vomiting. 60 tablet 2  . tiotropium (SPIRIVA HANDIHALER) 18 MCG inhalation capsule Place 1 capsule (18 mcg total) into inhaler and inhale daily. 30 capsule 1  . triamcinolone ointment (KENALOG) 0.5 % Apply 1 application topically 3 (three) times daily as needed. 30 g 0   No current facility-administered medications for this visit.    Facility-Administered Medications Ordered in Other Visits  Medication Dose Route Frequency Provider Last Rate Last Dose  . sodium chloride flush (NS) 0.9 % injection 10 mL  10 mL Intravenous PRN [name], MD   10 mL at 04/03/17 5361      .  PHYSICAL EXAMINATION: ECOG PERFORMANCE STATUS: 1 - Symptomatic but completely ambulatory Vitals:   10/30/17 1557  BP: 112/72  Pulse: 66  Temp: (!) 96.7 F (35.9 C)    SpO2: 97%   Filed Weights   10/30/17 1557  Weight: 207 lb 6 oz (94.1 kg)  Physical Exam  Constitutional: He is oriented to person, place, and time and well-developed, well-nourished, and in no distress. No distress.  HENT:  Head: Normocephalic and atraumatic.  Right Ear: External ear normal.  Left Ear: External ear normal.  Nose: Nose normal.  Mouth/Throat: Oropharynx is clear and moist. No oropharyngeal exudate.  Eyes: Pupils are equal, round, and reactive to light. Conjunctivae and EOM are normal. Right eye exhibits no discharge. Left eye exhibits no discharge. No scleral icterus.  Erythematous conjunctiva.   Neck: Normal range of motion. Neck supple. No JVD present.  Cardiovascular: Normal rate, regular rhythm and normal heart sounds.  No murmur heard. Pulmonary/Chest: Effort normal and breath sounds normal. No stridor. No respiratory distress. He has no wheezes. He has no rales. He exhibits no tenderness.  Decreased sound bilaterally lower lobe.   Abdominal: Soft. Bowel sounds are normal. He exhibits no distension and no mass. There is no tenderness. There is no rebound.  Musculoskeletal: Normal range of motion. He exhibits no edema or tenderness.  Lymphadenopathy:    He has no cervical adenopathy.  Neurological: He is alert and oriented to person, place, and time. No cranial nerve deficit. He exhibits normal muscle tone. Coordination normal.  Skin: Skin is warm and dry. No rash noted. He is not diaphoretic. No erythema.  Posterior right shoulder erythematous rash.   Psychiatric: Memory, affect and judgment normal.   LABORATORY DATA:  I have reviewed the data as listed Lab Results  Component Value Date   WBC 4.4 10/30/2017   HGB 12.4 (L) 10/30/2017   HCT 35.7 (L) 10/30/2017   MCV 94.3 10/30/2017   PLT 165 10/30/2017   Recent Labs    09/28/17 0831 10/16/17 0902 10/30/17 0835  NA 133* 137 137  K 4.3 4.1 3.9  CL 102 104 106  CO2 23 25 23   GLUCOSE 91 97 105*  BUN  26* 24* 23*  CREATININE 1.35* 1.46* 1.50*  CALCIUM 8.7* 8.8* 8.7*  GFRNONAA 52* 47* 45*  GFRAA 60* 54* 53*  PROT 7.0 6.8 6.7  ALBUMIN 3.8 3.7 3.8  AST 39 31 24  ALT 30 23 16*  ALKPHOS 56 54 55  BILITOT 0.9 0.7 0.6     ASSESSMENT & PLAN:  Cancer Staging Adenocarcinoma of left lung, stage 3 (HCC) Staging form: Lung, AJCC 8th Edition - Clinical stage from 02/14/2017: Stage IIIB (cT2b, cN3, cM0) - Signed by [name], MD on 02/14/2017  1. Adenocarcinoma of left lung, stage 3 ([place])   2. Encounter for antineoplastic immunotherapy   3. Hypothyroidism due to medication   4. Chronic conjunctivitis of both eyes, unspecified chronic conjunctivitis type   5. Skin rash   6. Other fatigue    # Lung cancer: CT scan was independently reviewed by me and discussed with patient.lung mass is slightly smaller and new irregular consolidation is most likely radiation changes.   Stable disease.  He tolerates immunotherapy well. Today's lab results were reviewed by me and discussed with patient   Proceed with Durvalumab treatment.     #Skin Rash, likely skin toxicity secondary to immunotherapy. Stable continue steroid topical cream PRN  # Acquired hypothyroidism:  Improved. TSH trended down to 21 today. Will call patient to increase Synthroid to 137 mcg daily.    # Fatigue: worsened. Can be a combination of hypothyroidism and immunotherapy side effects. Continue to monitor.  # Eye irritation: likely immunotherapy related conjunctivitis, stable. Continue eye drops. Follow up with ophthalmologist.  # SOB: continue COPD treatments. Monitor.  Return visit: Follow up in 2 weeks prior to next cycles of Durvalumab.  All questions were answered. The patient knows to call the clinic with any problems questions or concerns.  [name], MD, PhD Hematology Oncology [facility] at [facility] Pager- [phone] 10/30/2017

## 2017-11-12 ENCOUNTER — Ambulatory Visit: Payer: PPO | Admitting: Radiation Oncology

## 2017-11-13 ENCOUNTER — Inpatient Hospital Stay (HOSPITAL_BASED_OUTPATIENT_CLINIC_OR_DEPARTMENT_OTHER): Payer: PPO | Admitting: Oncology

## 2017-11-13 ENCOUNTER — Other Ambulatory Visit: Payer: Self-pay

## 2017-11-13 ENCOUNTER — Encounter: Payer: Self-pay | Admitting: Oncology

## 2017-11-13 ENCOUNTER — Inpatient Hospital Stay: Payer: PPO

## 2017-11-13 VITALS — BP 106/71 | HR 79 | Temp 96.6°F | Resp 18 | Wt 205.5 lb

## 2017-11-13 DIAGNOSIS — F329 Major depressive disorder, single episode, unspecified: Secondary | ICD-10-CM

## 2017-11-13 DIAGNOSIS — Z5112 Encounter for antineoplastic immunotherapy: Secondary | ICD-10-CM | POA: Diagnosis not present

## 2017-11-13 DIAGNOSIS — Z79899 Other long term (current) drug therapy: Secondary | ICD-10-CM

## 2017-11-13 DIAGNOSIS — N183 Chronic kidney disease, stage 3 unspecified: Secondary | ICD-10-CM

## 2017-11-13 DIAGNOSIS — H04129 Dry eye syndrome of unspecified lacrimal gland: Secondary | ICD-10-CM | POA: Diagnosis not present

## 2017-11-13 DIAGNOSIS — Z923 Personal history of irradiation: Secondary | ICD-10-CM | POA: Diagnosis not present

## 2017-11-13 DIAGNOSIS — D631 Anemia in chronic kidney disease: Secondary | ICD-10-CM | POA: Diagnosis not present

## 2017-11-13 DIAGNOSIS — Z9221 Personal history of antineoplastic chemotherapy: Secondary | ICD-10-CM

## 2017-11-13 DIAGNOSIS — R0602 Shortness of breath: Secondary | ICD-10-CM

## 2017-11-13 DIAGNOSIS — H10403 Unspecified chronic conjunctivitis, bilateral: Secondary | ICD-10-CM

## 2017-11-13 DIAGNOSIS — R21 Rash and other nonspecific skin eruption: Secondary | ICD-10-CM | POA: Diagnosis not present

## 2017-11-13 DIAGNOSIS — C3492 Malignant neoplasm of unspecified part of left bronchus or lung: Secondary | ICD-10-CM

## 2017-11-13 DIAGNOSIS — R05 Cough: Secondary | ICD-10-CM

## 2017-11-13 DIAGNOSIS — C770 Secondary and unspecified malignant neoplasm of lymph nodes of head, face and neck: Secondary | ICD-10-CM | POA: Diagnosis not present

## 2017-11-13 DIAGNOSIS — E032 Hypothyroidism due to medicaments and other exogenous substances: Secondary | ICD-10-CM

## 2017-11-13 DIAGNOSIS — R5383 Other fatigue: Secondary | ICD-10-CM

## 2017-11-13 DIAGNOSIS — M1990 Unspecified osteoarthritis, unspecified site: Secondary | ICD-10-CM

## 2017-11-13 DIAGNOSIS — C3412 Malignant neoplasm of upper lobe, left bronchus or lung: Secondary | ICD-10-CM | POA: Diagnosis not present

## 2017-11-13 DIAGNOSIS — I10 Essential (primary) hypertension: Secondary | ICD-10-CM

## 2017-11-13 DIAGNOSIS — N400 Enlarged prostate without lower urinary tract symptoms: Secondary | ICD-10-CM

## 2017-11-13 DIAGNOSIS — J449 Chronic obstructive pulmonary disease, unspecified: Secondary | ICD-10-CM

## 2017-11-13 DIAGNOSIS — Z7982 Long term (current) use of aspirin: Secondary | ICD-10-CM

## 2017-11-13 DIAGNOSIS — Z87891 Personal history of nicotine dependence: Secondary | ICD-10-CM

## 2017-11-13 LAB — CBC WITH DIFFERENTIAL/PLATELET
Basophils Absolute: 0.1 10*3/uL (ref 0–0.1)
Basophils Relative: 1 %
EOS ABS: 0.3 10*3/uL (ref 0–0.7)
Eosinophils Relative: 8 %
HCT: 34.8 % — ABNORMAL LOW (ref 40.0–52.0)
HEMOGLOBIN: 12.2 g/dL — AB (ref 13.0–18.0)
LYMPHS ABS: 0.9 10*3/uL — AB (ref 1.0–3.6)
Lymphocytes Relative: 21 %
MCH: 32.8 pg (ref 26.0–34.0)
MCHC: 35.1 g/dL (ref 32.0–36.0)
MCV: 93.5 fL (ref 80.0–100.0)
MONO ABS: 0.7 10*3/uL (ref 0.2–1.0)
MONOS PCT: 16 %
Neutro Abs: 2.2 10*3/uL (ref 1.4–6.5)
Neutrophils Relative %: 54 %
PLATELETS: 173 10*3/uL (ref 150–440)
RBC: 3.72 MIL/uL — ABNORMAL LOW (ref 4.40–5.90)
RDW: 15.3 % — ABNORMAL HIGH (ref 11.5–14.5)
WBC: 4.2 10*3/uL (ref 3.8–10.6)

## 2017-11-13 LAB — COMPREHENSIVE METABOLIC PANEL
ALT: 26 U/L (ref 17–63)
AST: 32 U/L (ref 15–41)
Albumin: 3.5 g/dL (ref 3.5–5.0)
Alkaline Phosphatase: 52 U/L (ref 38–126)
Anion gap: 7 (ref 5–15)
BUN: 24 mg/dL — AB (ref 6–20)
CHLORIDE: 108 mmol/L (ref 101–111)
CO2: 22 mmol/L (ref 22–32)
CREATININE: 1.81 mg/dL — AB (ref 0.61–1.24)
Calcium: 8.8 mg/dL — ABNORMAL LOW (ref 8.9–10.3)
GFR calc Af Amer: 42 mL/min — ABNORMAL LOW (ref >60)
GFR calc non Af Amer: 36 mL/min — ABNORMAL LOW (ref >60)
GLUCOSE: 108 mg/dL — AB (ref 65–99)
Potassium: 3.9 mmol/L (ref 3.5–5.1)
Sodium: 137 mmol/L (ref 135–145)
Total Bilirubin: 0.5 mg/dL (ref 0.3–1.2)
Total Protein: 6.5 g/dL (ref 6.5–8.1)

## 2017-11-13 LAB — TSH: TSH: 6.903 u[IU]/mL — AB (ref 0.350–4.500)

## 2017-11-13 MED ORDER — SODIUM CHLORIDE 0.9% FLUSH
10.0000 mL | INTRAVENOUS | Status: DC | PRN
  Administered 2017-11-13: 10 mL via INTRAVENOUS
  Filled 2017-11-13: qty 10

## 2017-11-13 MED ORDER — SODIUM CHLORIDE 0.9 % IV SOLN
10.0000 mg/kg | Freq: Once | INTRAVENOUS | Status: AC
  Administered 2017-11-13: 860 mg via INTRAVENOUS
  Filled 2017-11-13: qty 10

## 2017-11-13 MED ORDER — SODIUM CHLORIDE 0.9 % IV SOLN
Freq: Once | INTRAVENOUS | Status: AC
  Administered 2017-11-13: 10:00:00 via INTRAVENOUS
  Filled 2017-11-13: qty 1000

## 2017-11-13 MED ORDER — HEPARIN SOD (PORK) LOCK FLUSH 100 UNIT/ML IV SOLN: 500.0000 [IU] | Freq: Once | INTRAVENOUS | Status: DC | PRN

## 2017-11-13 MED ORDER — SODIUM CHLORIDE 0.9% FLUSH
10.0000 mL | INTRAVENOUS | Status: DC | PRN
  Filled 2017-11-13: qty 10

## 2017-11-13 MED ORDER — PROCHLORPERAZINE MALEATE 10 MG PO TABS: 10.0000 mg | ORAL_TABLET | Freq: Four times a day (QID) | ORAL | 2 refills | Status: DC | PRN

## 2017-11-13 MED ORDER — HEPARIN SOD (PORK) LOCK FLUSH 100 UNIT/ML IV SOLN
500.0000 [IU] | Freq: Once | INTRAVENOUS | Status: AC
  Administered 2017-11-13: 500 [IU] via INTRAVENOUS
  Filled 2017-11-13: qty 5

## 2017-11-13 NOTE — Progress Notes (Signed)
Hematology/Oncology Follow Up Note [facility] [phone] [phone]   Patient Care Team: [name], MD as PCP - General (Family Medicine) [name], [name], MD (General Surgery) [name], MD as Consulting Physician (Oncology) [name], RN as Registered Nurse  Reason for Visit: follow up for immunotherapy treatment of lung cancer  HISTORY OF PRESENTING ILLNESS:  Marc Gray 72 y.o.  male who presents for treatment of Stage IIIB (cT2b, cN3, cM0)  Adenocarcinoma of lung.  # Patient is s/p [place] and taxol concurrent RT for treatment of Stage IIIB Lung cancer. He recieved additional lung boost RT Currently on Durvalumab maintenance.   # Colon CA was listed in his history, however reviewing his surgical pathology from colonoscopy on 04/15/2014, during which he had polyps removed and all are negative for cancer.patient is scheduled for colonoscopy screening this year.  # 5/1/ 2019 CT scan was discussed at tumor board and changes consistent with radiation changes  INTERVAL HISTORY Patient he presents for assessment prior to immunotherapy for lung cancer treatment. #Shortness of breath with exertion: Report continue having shortness of breath, appears to be slightly worse compared to his baseline.  He reports that he has to recover for 10 minutes after he walks around.  He was prescribed with Advair with albuterol as needed.  He admits to not using as he supposed to do.  Denies any wheezing.  #Skin rash: Stable uses steroid cream as needed. #Dry eye: Stable use eyedrops as needed. #Fatigue: Has not improved despite thyroid medication being titrated.  Still feels tired and depressed.  Denies suicidal #Hypothyroidism, taking Synthroid 137 MCG daily.  Denies any palpitation, chest pain.. .    Review of Systems  Constitutional: Positive for fatigue. Negative for appetite change, chills, diaphoresis, fever and unexpected  weight change.  HENT:   Negative for hearing loss, lump/mass, mouth sores, nosebleeds and sore throat.   Eyes: Negative for eye problems and icterus.       Eye irritation.  Respiratory: Positive for cough and shortness of breath. Negative for chest tightness, hemoptysis and wheezing.        Chronic SOB with exertion.   Cardiovascular: Negative for chest pain, leg swelling and palpitations.  Gastrointestinal: Negative for abdominal distention, abdominal pain, blood in stool, diarrhea, nausea, rectal pain and vomiting.  Endocrine: Negative for hot flashes.  Genitourinary: Negative for bladder incontinence, difficulty urinating, dysuria, frequency, hematuria and nocturia.   Musculoskeletal: Negative for arthralgias, back pain, flank pain, gait problem and myalgias.  Skin: Negative for itching and rash.  Neurological: Negative for dizziness, gait problem, headaches, light-headedness, numbness and seizures.  Hematological: Negative for adenopathy. Does not bruise/bleed easily.  Psychiatric/Behavioral: Negative for confusion, decreased concentration and depression. The patient is not nervous/anxious.     MEDICAL HISTORY:  Past Medical History:  Diagnosis Date  . Anxiety   . Arthritis    SHOULDER  . Cataract   . Colon cancer ([place]) 2015   Pt states during his colonoscopy he had cancerous polyps removed.   . Depression    SINCE DIAGNOSIS  . Dyspnea    DOE  . Enlarged prostate   . Hypertension   . Lung cancer [facility])    Aug 2018  . Pain    DUE TO LUNG ISSUE    SURGICAL HISTORY: Past Surgical History:  Procedure Laterality Date  . CATARACT EXTRACTION W/ INTRAOCULAR LENS  IMPLANT, BILATERAL    . COLONOSCOPY    . EYE SURGERY    .  LYMPH GLAND EXCISION N/A 02/09/2017   Procedure: CERVICAL LYMPH NODE BIOPSY;  Surgeon: Robert Bellow, MD;  Location: [facility];  Service: General;  Laterality: N/A;  . PORTACATH PLACEMENT Right 02/23/2017   Procedure: INSERTION PORT-A-CATH;  Surgeon:  Robert Bellow, MD;  Location: [facility];  Service: General;  Laterality: Right;  . PROSTATE SURGERY    . TONSILLECTOMY      SOCIAL HISTORY: Social History   Socioeconomic History  . Marital status: Married    Spouse name: Not on file  . Number of children: Not on file  . Years of education: Not on file  . Highest education level: Not on file  Occupational History  . Not on file  Social Needs  . Financial resource strain: Not on file  . Food insecurity:    Worry: Not on file    Inability: Not on file  . Transportation needs:    Medical: Not on file    Non-medical: Not on file  Tobacco Use  . Smoking status: Former Smoker    Packs/day: 1.00    Years: 53.00    Pack years: 53.00    Types: Cigarettes    Last attempt to quit: 04/14/2017    Years since quitting: 0.5  . Smokeless tobacco: Never Used  . Tobacco comment: Quit November 2018  Substance and Sexual Activity  . Alcohol use: Yes    Comment: occassional  . Drug use: No  . Sexual activity: Yes  Lifestyle  . Physical activity:    Days per week: Not on file    Minutes per session: Not on file  . Stress: Not on file  Relationships  . Social connections:    Talks on phone: Not on file    Gets together: Not on file    Attends religious service: Not on file    Active member of club or organization: Not on file    Attends meetings of clubs or organizations: Not on file    Relationship status: Not on file  . Intimate partner violence:    Fear of current or ex partner: Not on file    Emotionally abused: Not on file    Physically abused: Not on file    Forced sexual activity: Not on file  Other Topics Concern  . Not on file  Social History Narrative  . Not on file    FAMILY HISTORY: Family History  Problem Relation Age of Onset  . Breast cancer Maternal Grandmother   . Dementia Mother   . Diabetes Father   . Stroke Father     ALLERGIES:  has No Known Allergies.  MEDICATIONS:  Current Outpatient  Medications  Medication Sig Dispense Refill  . albuterol (PROVENTIL HFA;VENTOLIN HFA) 108 (90 Base) MCG/ACT inhaler Inhale 2 puffs into the lungs every 6 (six) hours as needed for wheezing or shortness of breath. 1 Inhaler 2  . aspirin 325 MG tablet Take 650 mg by mouth daily.     . Chlorpheniramine-Phenylephrine (SINUS & ALLERGY) 4-10 MG tablet Take 1 tablet by mouth daily as needed.     . diphenhydrAMINE-zinc acetate (BENADRYL EXTRA STRENGTH) cream Apply 1 application topically 3 (three) times daily as needed for itching. 28 g 0  . fluticasone (FLONASE) 50 MCG/ACT nasal spray Place 1 spray into both nostrils daily. 16 g 2  . levothyroxine (SYNTHROID) 137 MCG tablet Take 1 tablet (137 mcg total) by mouth daily before breakfast. 30 tablet 1  . lidocaine-prilocaine (EMLA) cream Apply over port  1-2 hours prior to chemotherapy treatment.  Cover with plastic wrap. 30 g 1  . Multiple Vitamin (MULTI-VITAMINS) TABS Take 1 tablet by mouth daily.     . ondansetron (ZOFRAN) 8 MG tablet Take 1 tablet (8 mg total) by mouth every 8 (eight) hours as needed for nausea or vomiting. 60 tablet 1  . pantoprazole (PROTONIX) 40 MG tablet Take 1 tablet (40 mg total) by mouth daily. 30 tablet 2  . prochlorperazine (COMPAZINE) 10 MG tablet Take 1 tablet (10 mg total) by mouth every 6 (six) hours as needed for nausea or vomiting. 60 tablet 2  . tiotropium (SPIRIVA HANDIHALER) 18 MCG inhalation capsule Place 1 capsule (18 mcg total) into inhaler and inhale daily. 30 capsule 1  . triamcinolone ointment (KENALOG) 0.5 % Apply 1 application topically 3 (three) times daily as needed. 30 g 0  . morphine 20 MG/5ML solution Take 2.5 mLs (10 mg total) by mouth every 6 (six) hours as needed for pain (before meal). (Patient not taking: Reported on 11/13/2017) 100 mL 0   No current facility-administered medications for this visit.    Facility-Administered Medications Ordered in Other Visits  Medication Dose Route Frequency Provider  Last Rate Last Dose  . heparin lock flush 100 unit/mL  500 Units Intracatheter Once PRN [name], MD      . sodium chloride flush (NS) 0.9 % injection 10 mL  10 mL Intravenous PRN [name], MD   10 mL at 04/03/17 7425  . sodium chloride flush (NS) 0.9 % injection 10 mL  10 mL Intravenous PRN [name], MD   10 mL at 11/13/17 0843  . sodium chloride flush (NS) 0.9 % injection 10 mL  10 mL Intracatheter PRN [name], MD          .  PHYSICAL EXAMINATION: ECOG PERFORMANCE STATUS: 1 - Symptomatic but completely ambulatory Vitals:   11/13/17 0853  BP: 106/71  Pulse: 79  Resp: 18  Temp: (!) 96.6 F (35.9 C)  SpO2: 99%   Filed Weights   11/13/17 0853  Weight: 205 lb 8 oz (93.2 kg)  Physical Exam  Constitutional: He is oriented to person, place, and time and well-developed, well-nourished, and in no distress. No distress.  HENT:  Head: Normocephalic and atraumatic.  Right Ear: External ear normal.  Left Ear: External ear normal.  Nose: Nose normal.  Mouth/Throat: Oropharynx is clear and moist. No oropharyngeal exudate.  Eyes: Pupils are equal, round, and reactive to light. Conjunctivae and EOM are normal. Right eye exhibits no discharge. Left eye exhibits no discharge. No scleral icterus.  Erythematous conjunctiva.   Neck: Normal range of motion. Neck supple. No JVD present.  Cardiovascular: Normal rate, regular rhythm and normal heart sounds.  No murmur heard. Pulmonary/Chest: Effort normal and breath sounds normal. No stridor. No respiratory distress. He has no wheezes. He has no rales. He exhibits no tenderness.  Decreased sound bilaterally lower lobe.   Abdominal: Soft. Bowel sounds are normal. He exhibits no distension and no mass. There is no tenderness. There is no rebound.  Musculoskeletal: Normal range of motion. He exhibits no edema or tenderness.  Lymphadenopathy:    He has no cervical adenopathy.  Neurological: He is alert and oriented to person, place, and time. No  cranial nerve deficit. He exhibits normal muscle tone. Coordination normal.  Skin: Skin is warm and dry. No rash noted. He is not diaphoretic. No erythema.  Posterior right shoulder erythematous rash.   Psychiatric: Memory, affect  and judgment normal.   LABORATORY DATA:  I have reviewed the data as listed Lab Results  Component Value Date   WBC 4.2 11/13/2017   HGB 12.2 (L) 11/13/2017   HCT 34.8 (L) 11/13/2017   MCV 93.5 11/13/2017   PLT 173 11/13/2017   Recent Labs    10/16/17 0902 10/30/17 0835 11/13/17 0843  NA 137 137 137  K 4.1 3.9 3.9  CL 104 106 108  CO2 25 23 22   GLUCOSE 97 105* 108*  BUN 24* 23* 24*  CREATININE 1.46* 1.50* 1.81*  CALCIUM 8.8* 8.7* 8.8*  GFRNONAA 47* 45* 36*  GFRAA 54* 53* 42*  PROT 6.8 6.7 6.5  ALBUMIN 3.7 3.8 3.5  AST 31 24 32  ALT 23 16* 26  ALKPHOS 54 55 52  BILITOT 0.7 0.6 0.5     ASSESSMENT & PLAN:  Cancer Staging Adenocarcinoma of left lung, stage 3 (HCC) Staging form: Lung, AJCC 8th Edition - Clinical stage from 02/14/2017: Stage IIIB (cT2b, cN3, cM0) - Signed by [name], MD on 02/14/2017  1. Hypothyroidism due to medication   2. Adenocarcinoma of left lung, stage 3 ([place])   3. Encounter for antineoplastic immunotherapy   4. Chronic conjunctivitis of both eyes, unspecified chronic conjunctivitis type   5. Skin rash   6. Other fatigue   7. 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  Labs reviewed, except to both.  Proceed with today's durvalumab. #CKD,  to 1.8.  Patient reports taking a few days of Aleve last week.  Advised patient to stay away from any NSAIDs including Aleve, Advil, Motrin, naproxen, ibuprofen, etc.  Encourage oral hydration he voices understanding.  #Skin rash likely skin toxicity secondary to immunotherapy.  Stable, continue steroid topical as needed. #Acquired hypothyroidism secondary to  immunotherapy, improved.  TSH trended down to 21 at last visit.  Currently he is taking Synthroid 137 MCG daily.  TSH is pending.  We will continue to titrate according to his TSH level.  #Fatigue, appears to be worsening, combination of.  Continue to monitor. #Eye irritation secondary to chronic.  Use eyedrops..  Follow-up with neurology.  # SOB: Advised patient to twice daily in combination with albuterol as needed. Visit.  He will call if breathing is get worse.  If his symptoms do not improve despite using inhalers, plan repeat CT scan. .  Return visit: Follow up in 2 weeks prior to next cycles of Durvalumab.  All questions were answered. The patient knows to call the clinic with any problems questions or concerns.  [name], MD, PhD Hematology Oncology [facility] at [facility] Pager- [phone] 11/13/2017

## 2017-11-13 NOTE — Progress Notes (Signed)
Patient here for follow up. Complains of increased N/V and diarrhea in the past week.

## 2017-11-13 NOTE — Addendum Note (Signed)
Addended by: Vernetta Honey on: 11/13/2017 02:48 PM   Modules accepted: Orders

## 2017-11-19 ENCOUNTER — Inpatient Hospital Stay (HOSPITAL_BASED_OUTPATIENT_CLINIC_OR_DEPARTMENT_OTHER): Payer: PPO | Admitting: Nurse Practitioner

## 2017-11-19 ENCOUNTER — Other Ambulatory Visit: Payer: Self-pay | Admitting: *Deleted

## 2017-11-19 ENCOUNTER — Other Ambulatory Visit: Payer: Self-pay

## 2017-11-19 ENCOUNTER — Encounter: Payer: Self-pay | Admitting: Radiation Oncology

## 2017-11-19 ENCOUNTER — Encounter: Payer: Self-pay | Admitting: Nurse Practitioner

## 2017-11-19 ENCOUNTER — Inpatient Hospital Stay: Payer: PPO

## 2017-11-19 ENCOUNTER — Ambulatory Visit
Admission: RE | Admit: 2017-11-19 | Discharge: 2017-11-19 | Disposition: A | Discharge status: Home / Self Care | Payer: PPO | Source: Ambulatory Visit | Attending: Radiation Oncology | Admitting: Radiation Oncology

## 2017-11-19 VITALS — BP 106/65 | HR 82 | Temp 96.4°F | Resp 20 | Wt 203.0 lb

## 2017-11-19 VITALS — BP 92/66 | HR 81 | Temp 97.4°F | Resp 18 | Wt 203.3 lb

## 2017-11-19 DIAGNOSIS — I700 Atherosclerosis of aorta: Secondary | ICD-10-CM

## 2017-11-19 DIAGNOSIS — H04129 Dry eye syndrome of unspecified lacrimal gland: Secondary | ICD-10-CM | POA: Diagnosis not present

## 2017-11-19 DIAGNOSIS — I10 Essential (primary) hypertension: Secondary | ICD-10-CM

## 2017-11-19 DIAGNOSIS — C770 Secondary and unspecified malignant neoplasm of lymph nodes of head, face and neck: Secondary | ICD-10-CM | POA: Diagnosis not present

## 2017-11-19 DIAGNOSIS — R05 Cough: Secondary | ICD-10-CM

## 2017-11-19 DIAGNOSIS — Z9221 Personal history of antineoplastic chemotherapy: Secondary | ICD-10-CM | POA: Insufficient documentation

## 2017-11-19 DIAGNOSIS — C778 Secondary and unspecified malignant neoplasm of lymph nodes of multiple regions: Secondary | ICD-10-CM | POA: Diagnosis not present

## 2017-11-19 DIAGNOSIS — Z79899 Other long term (current) drug therapy: Secondary | ICD-10-CM

## 2017-11-19 DIAGNOSIS — J90 Pleural effusion, not elsewhere classified: Secondary | ICD-10-CM | POA: Diagnosis not present

## 2017-11-19 DIAGNOSIS — F17210 Nicotine dependence, cigarettes, uncomplicated: Secondary | ICD-10-CM | POA: Insufficient documentation

## 2017-11-19 DIAGNOSIS — Z5112 Encounter for antineoplastic immunotherapy: Secondary | ICD-10-CM | POA: Diagnosis not present

## 2017-11-19 DIAGNOSIS — Z923 Personal history of irradiation: Secondary | ICD-10-CM | POA: Insufficient documentation

## 2017-11-19 DIAGNOSIS — I313 Pericardial effusion (noninflammatory): Secondary | ICD-10-CM | POA: Diagnosis not present

## 2017-11-19 DIAGNOSIS — R112 Nausea with vomiting, unspecified: Secondary | ICD-10-CM

## 2017-11-19 DIAGNOSIS — C3492 Malignant neoplasm of unspecified part of left bronchus or lung: Secondary | ICD-10-CM

## 2017-11-19 DIAGNOSIS — E860 Dehydration: Secondary | ICD-10-CM

## 2017-11-19 DIAGNOSIS — J449 Chronic obstructive pulmonary disease, unspecified: Secondary | ICD-10-CM

## 2017-11-19 DIAGNOSIS — E032 Hypothyroidism due to medicaments and other exogenous substances: Secondary | ICD-10-CM

## 2017-11-19 DIAGNOSIS — R5383 Other fatigue: Secondary | ICD-10-CM | POA: Diagnosis not present

## 2017-11-19 DIAGNOSIS — R42 Dizziness and giddiness: Secondary | ICD-10-CM

## 2017-11-19 DIAGNOSIS — C3412 Malignant neoplasm of upper lobe, left bronchus or lung: Secondary | ICD-10-CM | POA: Diagnosis not present

## 2017-11-19 DIAGNOSIS — Z7982 Long term (current) use of aspirin: Secondary | ICD-10-CM

## 2017-11-19 DIAGNOSIS — R531 Weakness: Secondary | ICD-10-CM

## 2017-11-19 DIAGNOSIS — D631 Anemia in chronic kidney disease: Secondary | ICD-10-CM

## 2017-11-19 DIAGNOSIS — Z95828 Presence of other vascular implants and grafts: Secondary | ICD-10-CM

## 2017-11-19 DIAGNOSIS — Z87891 Personal history of nicotine dependence: Secondary | ICD-10-CM

## 2017-11-19 DIAGNOSIS — T451X5A Adverse effect of antineoplastic and immunosuppressive drugs, initial encounter: Secondary | ICD-10-CM

## 2017-11-19 DIAGNOSIS — F329 Major depressive disorder, single episode, unspecified: Secondary | ICD-10-CM

## 2017-11-19 DIAGNOSIS — K5900 Constipation, unspecified: Secondary | ICD-10-CM | POA: Diagnosis not present

## 2017-11-19 DIAGNOSIS — N400 Enlarged prostate without lower urinary tract symptoms: Secondary | ICD-10-CM

## 2017-11-19 DIAGNOSIS — N183 Chronic kidney disease, stage 3 (moderate): Secondary | ICD-10-CM

## 2017-11-19 DIAGNOSIS — M1990 Unspecified osteoarthritis, unspecified site: Secondary | ICD-10-CM

## 2017-11-19 LAB — COMPREHENSIVE METABOLIC PANEL
ALT: 30 U/L (ref 17–63)
AST: 37 U/L (ref 15–41)
Albumin: 3.6 g/dL (ref 3.5–5.0)
Alkaline Phosphatase: 47 U/L (ref 38–126)
Anion gap: 8 (ref 5–15)
BILIRUBIN TOTAL: 0.7 mg/dL (ref 0.3–1.2)
BUN: 32 mg/dL — AB (ref 6–20)
CO2: 23 mmol/L (ref 22–32)
CREATININE: 1.75 mg/dL — AB (ref 0.61–1.24)
Calcium: 9.2 mg/dL (ref 8.9–10.3)
Chloride: 104 mmol/L (ref 101–111)
GFR, EST AFRICAN AMERICAN: 44 mL/min — AB (ref >60)
GFR, EST NON AFRICAN AMERICAN: 38 mL/min — AB (ref >60)
Glucose, Bld: 105 mg/dL — ABNORMAL HIGH (ref 65–99)
POTASSIUM: 4.1 mmol/L (ref 3.5–5.1)
Sodium: 135 mmol/L (ref 135–145)
TOTAL PROTEIN: 6.7 g/dL (ref 6.5–8.1)

## 2017-11-19 LAB — CBC WITH DIFFERENTIAL/PLATELET
Basophils Absolute: 0.1 10*3/uL (ref 0–0.1)
Basophils Relative: 1 %
Eosinophils Absolute: 0.5 10*3/uL (ref 0–0.7)
Eosinophils Relative: 9 %
HEMATOCRIT: 36.7 % — AB (ref 40.0–52.0)
Hemoglobin: 12.6 g/dL — ABNORMAL LOW (ref 13.0–18.0)
LYMPHS ABS: 1.2 10*3/uL (ref 1.0–3.6)
LYMPHS PCT: 22 %
MCH: 32.4 pg (ref 26.0–34.0)
MCHC: 34.4 g/dL (ref 32.0–36.0)
MCV: 94.4 fL (ref 80.0–100.0)
MONO ABS: 1 10*3/uL (ref 0.2–1.0)
MONOS PCT: 17 %
NEUTROS ABS: 2.9 10*3/uL (ref 1.4–6.5)
Neutrophils Relative %: 51 %
Platelets: 218 10*3/uL (ref 150–440)
RBC: 3.89 MIL/uL — ABNORMAL LOW (ref 4.40–5.90)
RDW: 14.5 % (ref 11.5–14.5)
WBC: 5.6 10*3/uL (ref 3.8–10.6)

## 2017-11-19 LAB — MAGNESIUM: MAGNESIUM: 1.9 mg/dL (ref 1.7–2.4)

## 2017-11-19 MED ORDER — ONDANSETRON HCL 4 MG/2ML IJ SOLN
8.0000 mg | Freq: Once | INTRAMUSCULAR | Status: AC
  Administered 2017-11-19: 8 mg via INTRAVENOUS
  Filled 2017-11-19: qty 4

## 2017-11-19 MED ORDER — DEXAMETHASONE SODIUM PHOSPHATE 10 MG/ML IJ SOLN
10.0000 mg | Freq: Once | INTRAMUSCULAR | Status: AC
  Administered 2017-11-19: 10 mg via INTRAVENOUS
  Filled 2017-11-19: qty 1

## 2017-11-19 MED ORDER — HEPARIN SOD (PORK) LOCK FLUSH 100 UNIT/ML IV SOLN
500.0000 [IU] | Freq: Once | INTRAVENOUS | Status: AC
  Administered 2017-11-19: 500 [IU] via INTRAVENOUS

## 2017-11-19 MED ORDER — SODIUM CHLORIDE 0.9 % IV SOLN: Freq: Once | INTRAVENOUS | Status: DC

## 2017-11-19 MED ORDER — SODIUM CHLORIDE 0.9% FLUSH
10.0000 mL | INTRAVENOUS | Status: DC | PRN
  Administered 2017-11-19: 10 mL via INTRAVENOUS
  Filled 2017-11-19: qty 10

## 2017-11-19 MED ORDER — SODIUM CHLORIDE 0.9 % IV SOLN
Freq: Once | INTRAVENOUS | Status: AC
  Administered 2017-11-19: 11:00:00 via INTRAVENOUS
  Filled 2017-11-19: qty 1000

## 2017-11-19 NOTE — Progress Notes (Signed)
Radiation Oncology Follow up Note  Name: Marc Gray   Date:   11/19/2017 MRN:  [id number] DOB: Feb 11, 1946    This 72 y.o. male presents to the clinic today for 5 month follow-up status post concurrent chemoradiation for stage IIIB adenocarcinoma the left lung.  REFERRING PROVIDER: [name], MD  HPI: patient is a 72 year old male now out 5 months having completed concurrent chemoradiation for stage IIIB (T2IB N3 M0) adenocarcinoma the left lung. Seen today in routine follow-up he still fairly weak.He is currently being treated with Durvalumab . He states this is making weak and lethargic. He specifically denies cough hemoptysis or chest tightness.he did have a CT scan back in May showing left upper lobe mass slightly smaller so stay with evolutionary changes of radiation therapy. Does have a pericardial effusion as well as left-sided pleural effusion.   COMPLICATIONS OF TREATMENT: none  FOLLOW UP COMPLIANCE: keeps appointments   PHYSICAL EXAM:  BP 92/66   Pulse 81   Temp (!) 97.4 F (36.3 C)   Resp 18   Wt 203 lb 4.2 oz (92.2 kg)   BMI 26.82 kg/m  Well-developed well-nourished patient in NAD. HEENT reveals PERLA, EOMI, discs not visualized.  Oral cavity is clear. No oral mucosal lesions are identified. Neck is clear without evidence of cervical or supraclavicular adenopathy. Lungs are clear to A&P. Cardiac examination is essentially unremarkable with regular rate and rhythm without murmur rub or thrill. Abdomen is benign with no organomegaly or masses noted. Motor sensory and DTR levels are equal and symmetric in the upper and lower extremities. Cranial nerves II through XII are grossly intact. Proprioception is intact. No peripheral adenopathy or edema is identified. No motor or sensory levels are noted. Crude visual fields are within normal range.  RADIOLOGY RESULTS: CT scans reviewed and compatible above-stated findings  PLAN: present time patient is doing  fairly well I believe his fatigue and symptoms may be related to his maintenanceimmunotherapy.Otherwise I'm please was overall progress I've asked to see him back in 6 months for follow-up will anticipate seeing a CT scan at that time. Patient continues close follow-up care maintenance immunotherapy with medical oncology. He knows to call with any concerns.  I would like to take this opportunity to thank you for allowing me to participate in the care of your patient.Noreene Filbert, MD

## 2017-11-19 NOTE — Progress Notes (Signed)
[facility]pt[facility] [place]  [phone] Fax:([phone]) [phone]  Patient Care Team: Marc Pink, MD as PCP - General (Family Medicine) [facility], Marc Gleason, MD (General Surgery) Marc Server, MD as Consulting Physician (Oncology) Marc Nab, RN as Registered Nurse   Name of the patient: [facility]  [id number]  Dec 08, 1945   Date of visit: 11/19/17  Diagnosis-stage IIIb adenocarcinoma of the lung  Chief complaint/ Reason for visit- weakness, nausea & vomiting  Heme/Onc history:  Oncology History   Patient was initially referred by Dr. [place] [facility] (PCP) for evaluation of neck mass that had appeared in approximately 06/2016.  CT showed multiple cervical/supraclavicular lymphadenopathy as well as left upper lobe mass.  He had 53-year pack history and was current daily smoker at that time. 02/09/17-biopsy  DIAGNOSIS:  A. LYMPH NODE, LEFT CERVICAL; EXCISION:  - METASTATIC ADENOCARCINOMA OF LUNG ORIGIN Positive- TTF-1 Negative - p40  02/12/17- PET IMPRESSION: 1. Hypermetabolic left upper lobe mass with involvement of the left hilum and superior segment left lower lobe as well as hypermetabolic mediastinal, right hilar and supraclavicular adenopathy, most consistent with primary bronchogenic carcinoma (at least T2bN3M0 or stage IIIB disease). 2. Vague hypermetabolism in the right adrenal gland without definite CT correlate. 3. Small pericardial effusion. 4. A few scattered areas of peribronchovascular ground-glass are nonspecific and may be infectious or inflammatory in etiology. Metastatic disease cannot be definitively excluded. 5. Aortic atherosclerosis (ICD10-170.0). Coronary artery calcification.  02/12/17- MR BRAIN W WO CONTRAST IMPRESSION: 1. Negative for metastatic disease 2. Chronic microvascular ischemic changes in the white matter 3. Sinus mucosal disease.  Case was discussed at Multi-Disciplinary case conference and  concurrent chemoradiation was recommended.   He was evaluated by Dr. Baruch [facility] with radiation oncology who recommended concurrent chemoradiation and based on the large size of his tumor bilateral aspect he recommended treatment with initial course of radiation to 4140 cGy in 23 fractions followed by 1 week treatment break and small field boost following.  Initiated chemotherapy with carboplatin and taxol on 03/06/17, and completed 6 cycles on 04/10/2017.   Completed lung boost radiation on 05/21/17.   06/04/2017- CT CHEST W CONTRAST IMPRESSION: 1. Today's study demonstrates a positive response to therapy with some decrease in size of the large mass in the left upper lobe, as well as regression of the previously noted mediastinal and hilar lymphadenopathy. 2. New patchy areas of ground-glass attenuation and septal thickening in the lungs bilaterally. This is favored to be a treatment response, likely evolving postradiation changes. In the midst of these areas there is a somewhat nodular focus of ground-glass attenuation and septal thickening measuring 1.6 x 2.1 cm (axial image 91 of series 3) which warrants close attention on follow-up studies. 3. Interval development of thickening of the proximal third of the esophagus, also likely to reflect post treatment radiation esophagitis. Clinical correlation is recommended. 4. Slight increase in small small amount of pericardial fluid and/or thickening, also likely treatment related. 5. Mild diffuse bronchial wall thickening with mild centrilobular and paraseptal emphysema; imaging findings suggestive of underlying COPD. 6. Aortic atherosclerosis, in addition to left main and 3 vessel coronary artery disease. Please note that although the presence of coronary artery calcium documents the presence of coronary artery disease, the severity of this disease and any potential stenosis cannot be assessed on this non-gated CT examination. Assessment for potential risk  factor modification, dietary therapy or pharmacologic therapy may be warranted, if clinically indicated. Aortic Atherosclerosis (ICD10-I70.0) and Emphysema (ICD10-J43.9).  Initiated  maintenance durvalumab on 06/07/17.   09/26/2017- CT CHEST W CONTRAST IMPRESSION: 1. Left upper lobe mass appears slightly smaller, with associated evolutionary changes of radiation therapy in the left hemithorax.  2. New/progressive areas of irregular consolidation in the left lower lobe, most likely treatment related. 3. Trace loculated left pleural fluid. 4. Pericardial effusion, stable. 5. Aortic atherosclerosis (ICD10-170.0). Coronary artery calcification. 6. Aortic aneurysm NOS (ICD10-I71.9) Recommend annual imaging followup by CTA or MRA. This recommendation follows 2010 ACCF/AHA/AATS/ACR/ASA/SCA/SCAI/SIR/STS/SVM Guidelines for the Diagnosis  and Management of Patients with Thoracic Aortic Disease. Circulation. 2010; 121: M578-I696. 7.  Emphysema (ICD10-J43.9). 8. Cholelithiasis versus gallbladder wall calcification.     Adenocarcinoma of left lung, stage 3 ([facility])   02/14/2017 Initial Diagnosis    Adenocarcinoma of left lung, stage 3 ([facility])      Patient is stage IIIb lung adenocarcinoma, s/p concurrent chemoradiation currently on maintenance immunotherapy with durvalumab. Last seen by primary oncologist, Dr. Tasia [facility] on 11/13/17 for assessment prior to [place].   Interval history- Patient is a 72 year old male who presents to symptom management clinic for concerns of weakness. He has felt symptoms for several weeks and they have gradually worsened. He describes nausea starting when he wakes up in the morning and it gradually improves with prescribed zofran and compazine. He continues protonix for gerd symptoms. Nothing seems to make symptoms worse  Per chart review, previously he had creatinine of 1.8 and reported taking NSAIDs at home.  Dr. Tasia [facility] encouraged increasing oral hydration and avoiding NSAIDs.   ECOG FS:2  - Symptomatic, <50% confined to bed  Review of systems- Review of Systems  Constitutional: Positive for weight loss. Negative for chills and fever.  HENT: Negative for congestion, ear discharge, ear pain, sinus pain, sore throat and tinnitus.   Eyes: Negative.   Respiratory: Positive for cough. Negative for hemoptysis, sputum production, shortness of breath and wheezing.   Cardiovascular: Negative for chest pain, palpitations, orthopnea, claudication and leg swelling.  Gastrointestinal: Positive for constipation, nausea and vomiting. Negative for abdominal pain, blood in stool, diarrhea and heartburn.  Genitourinary: Negative.   Musculoskeletal: Negative.   Skin: Negative.   Neurological: Positive for dizziness and weakness. Negative for tingling, loss of consciousness and headaches.  Endo/Heme/Allergies: Negative.   Psychiatric/Behavioral: Positive for depression. Negative for suicidal ideas. The patient is not nervous/anxious.      Current treatment- s/p concurrent chemoradiation, currently on maintenance Durvalumab.   No Known Allergies   Past Medical History:  Diagnosis Date  . Anxiety   . Arthritis    SHOULDER  . Cataract   . Colon cancer ([place]) 2015   Pt states during his colonoscopy he had cancerous polyps removed.   . Depression    SINCE DIAGNOSIS  . Dyspnea    DOE  . Enlarged prostate   . Hypertension   . Lung cancer [facility])    Aug 2018  . Pain    DUE TO LUNG ISSUE     Past Surgical History:  Procedure Laterality Date  . CATARACT EXTRACTION W/ INTRAOCULAR LENS  IMPLANT, BILATERAL    . COLONOSCOPY    . EYE SURGERY    . LYMPH GLAND EXCISION N/A 02/09/2017   Procedure: CERVICAL LYMPH NODE BIOPSY;  Surgeon: Robert Bellow, MD;  Location: [facility];  Service: General;  Laterality: N/A;  . PORTACATH PLACEMENT Right 02/23/2017   Procedure: INSERTION PORT-A-CATH;  Surgeon: Robert Bellow, MD;  Location: [facility];  Service: General;  Laterality: Right;  .  PROSTATE SURGERY    .  TONSILLECTOMY      Social History   Socioeconomic History  . Marital status: Married    Spouse name: Not on file  . Number of children: Not on file  . Years of education: Not on file  . Highest education level: Not on file  Occupational History  . Not on file  Social Needs  . Financial resource strain: Not on file  . Food insecurity:    Worry: Not on file    Inability: Not on file  . Transportation needs:    Medical: Not on file    Non-medical: Not on file  Tobacco Use  . Smoking status: Former Smoker    Packs/day: 1.00    Years: 53.00    Pack years: 53.00    Types: Cigarettes    Last attempt to quit: 04/14/2017    Years since quitting: 0.6  . Smokeless tobacco: Never Used  . Tobacco comment: Quit November 2018  Substance and Sexual Activity  . Alcohol use: Yes    Comment: occassional  . Drug use: No  . Sexual activity: Yes  Lifestyle  . Physical activity:    Days per week: Not on file    Minutes per session: Not on file  . Stress: Not on file  Relationships  . Social connections:    Talks on phone: Not on file    Gets together: Not on file    Attends religious service: Not on file    Active member of club or organization: Not on file    Attends meetings of clubs or organizations: Not on file    Relationship status: Not on file  . Intimate partner violence:    Fear of current or ex partner: Not on file    Emotionally abused: Not on file    Physically abused: Not on file    Forced sexual activity: Not on file  Other Topics Concern  . Not on file  Social History Narrative  . Not on file    Family History  Problem Relation Age of Onset  . Breast cancer Maternal Grandmother   . Dementia Mother   . Diabetes Father   . Stroke Father      Current Outpatient Medications:  .  albuterol (PROVENTIL HFA;VENTOLIN HFA) 108 (90 Base) MCG/ACT inhaler, Inhale 2 puffs into the lungs every 6 (six) hours as needed for wheezing or shortness of  breath., Disp: 1 Inhaler, Rfl: 2 .  aspirin 325 MG tablet, Take 650 mg by mouth daily. , Disp: , Rfl:  .  Chlorpheniramine-Phenylephrine (SINUS & ALLERGY) 4-10 MG tablet, Take 1 tablet by mouth daily as needed. , Disp: , Rfl:  .  diphenhydrAMINE-zinc acetate (BENADRYL EXTRA STRENGTH) cream, Apply 1 application topically 3 (three) times daily as needed for itching., Disp: 28 g, Rfl: 0 .  fluticasone (FLONASE) 50 MCG/ACT nasal spray, Place 1 spray into both nostrils daily., Disp: 16 g, Rfl: 2 .  levothyroxine (SYNTHROID) 137 MCG tablet, Take 1 tablet (137 mcg total) by mouth daily before breakfast., Disp: 30 tablet, Rfl: 1 .  lidocaine-prilocaine (EMLA) cream, Apply over port 1-2 hours prior to chemotherapy treatment.  Cover with plastic wrap., Disp: 30 g, Rfl: 1 .  Multiple Vitamin (MULTI-VITAMINS) TABS, Take 1 tablet by mouth daily. , Disp: , Rfl:  .  ondansetron (ZOFRAN) 8 MG tablet, Take 1 tablet (8 mg total) by mouth every 8 (eight) hours as needed for nausea or vomiting., Disp: 60 tablet, Rfl: 1 .  pantoprazole (PROTONIX) 40  MG tablet, Take 1 tablet (40 mg total) by mouth daily., Disp: 30 tablet, Rfl: 2 .  prochlorperazine (COMPAZINE) 10 MG tablet, Take 1 tablet (10 mg total) by mouth every 6 (six) hours as needed for nausea or vomiting., Disp: 60 tablet, Rfl: 2 .  tiotropium (SPIRIVA HANDIHALER) 18 MCG inhalation capsule, Place 1 capsule (18 mcg total) into inhaler and inhale daily., Disp: 30 capsule, Rfl: 1 .  triamcinolone ointment (KENALOG) 0.5 %, Apply 1 application topically 3 (three) times daily as needed., Disp: 30 g, Rfl: 0 No current facility-administered medications for this visit.   Facility-Administered Medications Ordered in Other Visits:  .  sodium chloride flush (NS) 0.9 % injection 10 mL, 10 mL, Intravenous, PRN, Marc Server, MD, 10 mL at 04/03/17 4098 .  sodium chloride flush (NS) 0.9 % injection 10 mL, 10 mL, Intravenous, PRN, Jacquelin Hawking, NP, 10 mL at 11/19/17  1044  Physical exam:  Vitals:   11/19/17 1008 11/19/17 1009 11/19/17 1315  BP:  (!) 76/56 106/65  Pulse:  82   Resp:  20   Temp:  (!) 96.4 F (35.8 C)   TempSrc:  Tympanic   SpO2:  93%   Weight: 203 lb (92.1 kg)     Physical Exam  Constitutional: He is oriented to person, place, and time. He appears well-developed and well-nourished. No distress.  Accompanied  HENT:  Head: Normocephalic and atraumatic.  Nose: Nose normal.  Mouth/Throat: Oropharynx is clear and moist. No oropharyngeal exudate.  Eyes: Pupils are equal, round, and reactive to light. Conjunctivae and EOM are normal. No scleral icterus.  Cardiovascular: Normal rate, regular rhythm and normal heart sounds.  Pulmonary/Chest: Effort normal and breath sounds normal. No respiratory distress. He has no wheezes.  Abdominal: Soft. Bowel sounds are normal. He exhibits no distension. There is no tenderness.  Musculoskeletal: Normal range of motion. He exhibits no edema.  Neurological: He is alert and oriented to person, place, and time.  Skin: Skin is warm and dry. There is pallor.  Psychiatric: He has a normal mood and affect. His behavior is normal.     CMP Latest Ref Rng & Units 11/19/2017  Glucose 65 - 99 mg/dL 105(H)  BUN 6 - 20 mg/dL 32(H)  Creatinine 0.61 - 1.24 mg/dL 1.75(H)  Sodium 135 - 145 mmol/L 135  Potassium 3.5 - 5.1 mmol/L 4.1  Chloride 101 - 111 mmol/L 104  CO2 22 - 32 mmol/L 23  Calcium 8.9 - 10.3 mg/dL 9.2  Total Protein 6.5 - 8.1 g/dL 6.7  Total Bilirubin 0.3 - 1.2 mg/dL 0.7  Alkaline Phos 38 - 126 U/L 47  AST 15 - 41 U/L 37  ALT 17 - 63 U/L 30   CBC Latest Ref Rng & Units 11/19/2017  WBC 3.8 - 10.6 K/uL 5.6  Hemoglobin 13.0 - 18.0 g/dL 12.6(L)  Hematocrit 40.0 - 52.0 % 36.7(L)  Platelets 150 - 440 K/uL 218    No images are attached to the encounter.  No results found.   Assessment and plan- Patient is a 72 y.o. male who [facility] to [facility] for complaints of Weakness and Vomiting.   1. Stage  IIIB Lung Cancer- adenocarcinoma of the lung- s/p concurrent carbo-taxol and radiation, now on maintenance Durvalbumab. He also completed additional lung boost RT. 09/26/17 CT scan was previously discussed at [facility] and consensus was that changes were consistent with radiation changes.    2. Nausea and Vomiting- likely associated with Imfinzi. K 4.1. Discussed prophylaxis administration  of anti-emetics and continuing PPI. Zofran and Decadron given in clinic today. Could consider referral to dietary for discussion of food selections, could also consider adding Zyprexa or Ativan. Recommended patient take anti-emetic before bed or first thing in morning to evaluate symptom control. Follow up with Dr. Tasia [facility] for further evaluation and management.   3. Dehydration- likely r/t nausea, vomiting, and poor oral intake. Cr 1.75, BUN 32. BP 76/56, HR 82. Unable to perform orthostatic VS d/t symptoms. Weight down 2 lbs in 5 days. 2 L NaCl given in clinic. After fluids BP improved significantly. Will have patient rtc in 2 days to recheck kidney function and IV fluids.   4. Constipation- per patient no BM in one week. Had 2 large BMs in clinic today that appeared normal. Suspect constipation exacerbated by underlying dehydration. Continue to monitor. May consider senna or miralax for persistent symptoms.   rtc on 11/21/17 to repeat CMP and additional IV fluids. Follow up with Dr. Tasia [facility] for further evaluation and management as scheduled on 11/27/17.   Visit Diagnosis 1. Adenocarcinoma of left lung, stage 3 ([place])   2. Chemotherapy induced nausea and vomiting   3. Dehydration   4. Constipation, acute     Patient expressed understanding and was in agreement with this plan. He also understands that He can call clinic at any time with any questions, concerns, or complaints.    Beckey Rutter, DNP, AGNP-C [facility] [phone] (work cell) [phone] (office) 11/19/17 4:41  PM

## 2017-11-21 ENCOUNTER — Inpatient Hospital Stay: Payer: PPO

## 2017-11-21 VITALS — BP 155/85 | HR 71 | Temp 96.6°F | Resp 20

## 2017-11-21 DIAGNOSIS — C3492 Malignant neoplasm of unspecified part of left bronchus or lung: Secondary | ICD-10-CM

## 2017-11-21 DIAGNOSIS — E860 Dehydration: Secondary | ICD-10-CM

## 2017-11-21 DIAGNOSIS — Z5112 Encounter for antineoplastic immunotherapy: Secondary | ICD-10-CM | POA: Diagnosis not present

## 2017-11-21 LAB — BASIC METABOLIC PANEL
Anion gap: 7 (ref 5–15)
BUN: 30 mg/dL — ABNORMAL HIGH (ref 8–23)
CALCIUM: 8.6 mg/dL — AB (ref 8.9–10.3)
CO2: 22 mmol/L (ref 22–32)
Chloride: 108 mmol/L (ref 98–111)
Creatinine, Ser: 1.5 mg/dL — ABNORMAL HIGH (ref 0.61–1.24)
GFR, EST AFRICAN AMERICAN: 53 mL/min — AB (ref >60)
GFR, EST NON AFRICAN AMERICAN: 45 mL/min — AB (ref >60)
GLUCOSE: 104 mg/dL — AB (ref 70–99)
POTASSIUM: 4.2 mmol/L (ref 3.5–5.1)
Sodium: 137 mmol/L (ref 135–145)

## 2017-11-21 MED ORDER — SODIUM CHLORIDE 0.9% FLUSH
10.0000 mL | INTRAVENOUS | Status: DC | PRN
  Administered 2017-11-21: 10 mL via INTRAVENOUS
  Filled 2017-11-21: qty 10

## 2017-11-21 MED ORDER — HEPARIN SOD (PORK) LOCK FLUSH 100 UNIT/ML IV SOLN: 500.0000 [IU] | Freq: Once | INTRAVENOUS | Status: DC | PRN

## 2017-11-21 MED ORDER — SODIUM CHLORIDE 0.9 % IV SOLN
Freq: Once | INTRAVENOUS | Status: AC
  Administered 2017-11-21: 09:00:00 via INTRAVENOUS
  Filled 2017-11-21: qty 1000

## 2017-11-21 MED ORDER — SODIUM CHLORIDE 0.9 % IV SOLN
Freq: Once | INTRAVENOUS | Status: AC
  Administered 2017-11-21: 10:00:00 via INTRAVENOUS
  Filled 2017-11-21: qty 1000

## 2017-11-21 MED ORDER — HEPARIN SOD (PORK) LOCK FLUSH 100 UNIT/ML IV SOLN
500.0000 [IU] | Freq: Once | INTRAVENOUS | Status: AC
  Administered 2017-11-21: 500 [IU] via INTRAVENOUS

## 2017-11-27 ENCOUNTER — Inpatient Hospital Stay: Payer: PPO

## 2017-11-27 ENCOUNTER — Ambulatory Visit
Admission: RE | Admit: 2017-11-27 | Discharge: 2017-11-27 | Disposition: A | Discharge status: Home / Self Care | Payer: PPO | Source: Ambulatory Visit | Attending: Oncology | Admitting: Oncology

## 2017-11-27 ENCOUNTER — Other Ambulatory Visit: Payer: Self-pay

## 2017-11-27 ENCOUNTER — Inpatient Hospital Stay: Payer: PPO | Attending: Oncology

## 2017-11-27 ENCOUNTER — Encounter: Payer: Self-pay | Admitting: Oncology

## 2017-11-27 ENCOUNTER — Inpatient Hospital Stay (HOSPITAL_BASED_OUTPATIENT_CLINIC_OR_DEPARTMENT_OTHER): Payer: PPO | Admitting: Oncology

## 2017-11-27 VITALS — BP 81/49 | HR 83 | Temp 97.5°F | Resp 18 | Wt 203.2 lb

## 2017-11-27 DIAGNOSIS — R531 Weakness: Secondary | ICD-10-CM

## 2017-11-27 DIAGNOSIS — I10 Essential (primary) hypertension: Secondary | ICD-10-CM | POA: Diagnosis not present

## 2017-11-27 DIAGNOSIS — J328 Other chronic sinusitis: Secondary | ICD-10-CM | POA: Diagnosis not present

## 2017-11-27 DIAGNOSIS — I952 Hypotension due to drugs: Secondary | ICD-10-CM | POA: Diagnosis not present

## 2017-11-27 DIAGNOSIS — J439 Emphysema, unspecified: Secondary | ICD-10-CM

## 2017-11-27 DIAGNOSIS — Z5112 Encounter for antineoplastic immunotherapy: Secondary | ICD-10-CM

## 2017-11-27 DIAGNOSIS — E032 Hypothyroidism due to medicaments and other exogenous substances: Secondary | ICD-10-CM | POA: Insufficient documentation

## 2017-11-27 DIAGNOSIS — H109 Unspecified conjunctivitis: Secondary | ICD-10-CM | POA: Diagnosis not present

## 2017-11-27 DIAGNOSIS — R5383 Other fatigue: Secondary | ICD-10-CM | POA: Diagnosis not present

## 2017-11-27 DIAGNOSIS — C3412 Malignant neoplasm of upper lobe, left bronchus or lung: Secondary | ICD-10-CM

## 2017-11-27 DIAGNOSIS — Z7982 Long term (current) use of aspirin: Secondary | ICD-10-CM | POA: Insufficient documentation

## 2017-11-27 DIAGNOSIS — Z5111 Encounter for antineoplastic chemotherapy: Secondary | ICD-10-CM | POA: Diagnosis not present

## 2017-11-27 DIAGNOSIS — C770 Secondary and unspecified malignant neoplasm of lymph nodes of head, face and neck: Secondary | ICD-10-CM | POA: Diagnosis not present

## 2017-11-27 DIAGNOSIS — M4854XS Collapsed vertebra, not elsewhere classified, thoracic region, sequela of fracture: Secondary | ICD-10-CM | POA: Insufficient documentation

## 2017-11-27 DIAGNOSIS — Z79899 Other long term (current) drug therapy: Secondary | ICD-10-CM | POA: Diagnosis not present

## 2017-11-27 DIAGNOSIS — Z87891 Personal history of nicotine dependence: Secondary | ICD-10-CM | POA: Diagnosis not present

## 2017-11-27 DIAGNOSIS — X58XXXS Exposure to other specified factors, sequela: Secondary | ICD-10-CM | POA: Insufficient documentation

## 2017-11-27 DIAGNOSIS — Z7952 Long term (current) use of systemic steroids: Secondary | ICD-10-CM | POA: Insufficient documentation

## 2017-11-27 DIAGNOSIS — Z9221 Personal history of antineoplastic chemotherapy: Secondary | ICD-10-CM | POA: Insufficient documentation

## 2017-11-27 DIAGNOSIS — Z923 Personal history of irradiation: Secondary | ICD-10-CM | POA: Insufficient documentation

## 2017-11-27 DIAGNOSIS — R21 Rash and other nonspecific skin eruption: Secondary | ICD-10-CM | POA: Insufficient documentation

## 2017-11-27 DIAGNOSIS — C3492 Malignant neoplasm of unspecified part of left bronchus or lung: Secondary | ICD-10-CM

## 2017-11-27 DIAGNOSIS — E2740 Unspecified adrenocortical insufficiency: Secondary | ICD-10-CM | POA: Insufficient documentation

## 2017-11-27 DIAGNOSIS — C3490 Malignant neoplasm of unspecified part of unspecified bronchus or lung: Secondary | ICD-10-CM | POA: Diagnosis not present

## 2017-11-27 LAB — CORTISOL: CORTISOL PLASMA: 0.7 ug/dL

## 2017-11-27 LAB — TSH: TSH: 5.738 u[IU]/mL — AB (ref 0.350–4.500)

## 2017-11-27 LAB — COMPREHENSIVE METABOLIC PANEL
ALT: 21 U/L (ref 0–44)
AST: 24 U/L (ref 15–41)
Albumin: 3.6 g/dL (ref 3.5–5.0)
Alkaline Phosphatase: 40 U/L (ref 38–126)
Anion gap: 8 (ref 5–15)
BILIRUBIN TOTAL: 0.7 mg/dL (ref 0.3–1.2)
BUN: 27 mg/dL — AB (ref 8–23)
CO2: 23 mmol/L (ref 22–32)
CREATININE: 1.62 mg/dL — AB (ref 0.61–1.24)
Calcium: 8.6 mg/dL — ABNORMAL LOW (ref 8.9–10.3)
Chloride: 103 mmol/L (ref 98–111)
GFR, EST AFRICAN AMERICAN: 48 mL/min — AB (ref >60)
GFR, EST NON AFRICAN AMERICAN: 41 mL/min — AB (ref >60)
Glucose, Bld: 121 mg/dL — ABNORMAL HIGH (ref 70–99)
Potassium: 4 mmol/L (ref 3.5–5.1)
Sodium: 134 mmol/L — ABNORMAL LOW (ref 135–145)
TOTAL PROTEIN: 6.4 g/dL — AB (ref 6.5–8.1)

## 2017-11-27 LAB — CBC WITH DIFFERENTIAL/PLATELET
BASOS ABS: 0.1 10*3/uL (ref 0–0.1)
BASOS PCT: 2 %
EOS ABS: 0.6 10*3/uL (ref 0–0.7)
EOS PCT: 12 %
HCT: 34.5 % — ABNORMAL LOW (ref 40.0–52.0)
HEMOGLOBIN: 12 g/dL — AB (ref 13.0–18.0)
Lymphocytes Relative: 20 %
Lymphs Abs: 1 10*3/uL (ref 1.0–3.6)
MCH: 32.7 pg (ref 26.0–34.0)
MCHC: 34.6 g/dL (ref 32.0–36.0)
MCV: 94.4 fL (ref 80.0–100.0)
Monocytes Absolute: 0.6 10*3/uL (ref 0.2–1.0)
Monocytes Relative: 11 %
NEUTROS PCT: 55 %
Neutro Abs: 2.8 10*3/uL (ref 1.4–6.5)
PLATELETS: 218 10*3/uL (ref 150–440)
RBC: 3.65 MIL/uL — AB (ref 4.40–5.90)
RDW: 14.3 % (ref 11.5–14.5)
WBC: 5.1 10*3/uL (ref 3.8–10.6)

## 2017-11-27 MED ORDER — SODIUM CHLORIDE 0.9% FLUSH
10.0000 mL | Freq: Once | INTRAVENOUS | Status: AC
  Administered 2017-11-27: 10 mL via INTRAVENOUS
  Filled 2017-11-27: qty 10

## 2017-11-27 MED ORDER — PREDNISONE 50 MG PO TABS: ORAL_TABLET | ORAL | 0 refills | Status: DC

## 2017-11-27 MED ORDER — GADOBENATE DIMEGLUMINE 529 MG/ML IV SOLN
10.0000 mL | Freq: Once | INTRAVENOUS | Status: AC | PRN
  Administered 2017-11-27: 10 mL via INTRAVENOUS

## 2017-11-27 MED ORDER — HEPARIN SOD (PORK) LOCK FLUSH 100 UNIT/ML IV SOLN
500.0000 [IU] | Freq: Once | INTRAVENOUS | Status: AC
  Administered 2017-11-27: 500 [IU] via INTRAVENOUS

## 2017-11-27 NOTE — Progress Notes (Signed)
Hematology/Oncology Follow Up Note [facility] Telephone:([phone] Fax:(336) [phone]   Patient Care Team: [name], MD as PCP - General (Family Medicine) [name], [name], MD (General Surgery) [name], MD as Consulting Physician (Oncology) [name], RN as Registered Nurse  Reason for Visit:  Follow up for immunotherapy treatment of lung cancer.   HISTORY OF PRESENTING ILLNESS:  Marc Gray 72 y.o.  male who presents for treatment of Stage IIIB (cT2b, cN3, cM0)  Adenocarcinoma of lung.  # Patient is s/p [place] and taxol concurrent RT for treatment of Stage IIIB Lung cancer. He recieved additional lung boost RT Currently on Durvalumab maintenance.   # Colon CA was listed in his history, however reviewing his surgical pathology from colonoscopy on 04/15/2014, during which he had polyps removed and all are negative for cancer.patient is scheduled for colonoscopy screening this year.  # 5/1/ 2019 CT scan was discussed at tumor board and changes consistent with radiation changes  INTERVAL HISTORY Patient he presents for assessment prior to immunotherapy for lung cancer.  Last week, he was seen by symptom management due to profound hypotension, weakness, feeling nausea. He was given Zofran, Decadron and IVF hydration for dehydration.  Weakness: continue to feel weak, gradual onset, getting worse.  Hypotension: s/p IVF last week and BP improved afterwards. Today BP was low again 81/49, reports not feeling well, weak, did not sleep well.   #Skin rash: stable, uses steroid cream.  as needed. #Hypothyroidism, taking synthroid 137mg  daily.    Review of Systems  Constitutional: Positive for appetite change and fatigue. Negative for chills, diaphoresis, fever and unexpected weight change.  HENT:   Negative for hearing loss, lump/mass, mouth sores, nosebleeds and sore throat.   Eyes: Negative for eye problems and icterus.       Eye  irritation.  Respiratory: Positive for shortness of breath. Negative for chest tightness, cough, hemoptysis and wheezing.        Chronic SOB with exertion.   Cardiovascular: Negative for chest pain, leg swelling and palpitations.  Gastrointestinal: Negative for abdominal distention, abdominal pain, blood in stool, diarrhea, nausea, rectal pain and vomiting.  Endocrine: Negative for hot flashes.  Genitourinary: Negative for bladder incontinence, difficulty urinating, dysuria, frequency, hematuria and nocturia.   Musculoskeletal: Negative for arthralgias, back pain, flank pain, gait problem and myalgias.  Skin: Negative for itching and rash.  Neurological: Negative for gait problem, headaches, light-headedness, numbness and seizures.  Hematological: Negative for adenopathy. Does not bruise/bleed easily.  Psychiatric/Behavioral: Negative for confusion, decreased concentration and depression. The patient is not nervous/anxious.     MEDICAL HISTORY:  Past Medical History:  Diagnosis Date  . Anxiety   . Arthritis    SHOULDER  . Cataract   . Colon cancer ([place]) 2015   Pt states during his colonoscopy he had cancerous polyps removed.   . Depression    SINCE DIAGNOSIS  . Dyspnea    DOE  . Enlarged prostate   . Hypertension   . Lung cancer [facility])    Aug 2018  . Pain    DUE TO LUNG ISSUE    SURGICAL HISTORY: Past Surgical History:  Procedure Laterality Date  . CATARACT EXTRACTION W/ INTRAOCULAR LENS  IMPLANT, BILATERAL    . COLONOSCOPY    . EYE SURGERY    . LYMPH GLAND EXCISION N/A 02/09/2017   Procedure: CERVICAL LYMPH NODE BIOPSY;  Surgeon: Robert Bellow, MD;  Location: [facility] ORS;  Service: General;  Laterality: N/A;  .  PORTACATH PLACEMENT Right 02/23/2017   Procedure: INSERTION PORT-A-CATH;  Surgeon: Robert Bellow, MD;  Location: [facility] ORS;  Service: General;  Laterality: Right;  . PROSTATE SURGERY    . TONSILLECTOMY      SOCIAL HISTORY: Social History   Socioeconomic  History  . Marital status: Married    Spouse name: Not on file  . Number of children: Not on file  . Years of education: Not on file  . Highest education level: Not on file  Occupational History  . Not on file  Social Needs  . Financial resource strain: Not on file  . Food insecurity:    Worry: Not on file    Inability: Not on file  . Transportation needs:    Medical: Not on file    Non-medical: Not on file  Tobacco Use  . Smoking status: Former Smoker    Packs/day: 1.00    Years: 53.00    Pack years: 53.00    Types: Cigarettes    Last attempt to quit: 04/14/2017    Years since quitting: 0.6  . Smokeless tobacco: Never Used  . Tobacco comment: Quit November 2018  Substance and Sexual Activity  . Alcohol use: Yes    Comment: occassional  . Drug use: No  . Sexual activity: Yes  Lifestyle  . Physical activity:    Days per week: Not on file    Minutes per session: Not on file  . Stress: Not on file  Relationships  . Social connections:    Talks on phone: Not on file    Gets together: Not on file    Attends religious service: Not on file    Active member of club or organization: Not on file    Attends meetings of clubs or organizations: Not on file    Relationship status: Not on file  . Intimate partner violence:    Fear of current or ex partner: Not on file    Emotionally abused: Not on file    Physically abused: Not on file    Forced sexual activity: Not on file  Other Topics Concern  . Not on file  Social History Narrative  . Not on file    FAMILY HISTORY: Family History  Problem Relation Age of Onset  . Breast cancer Maternal Grandmother   . Dementia Mother   . Diabetes Father   . Stroke Father     ALLERGIES:  has No Known Allergies.  MEDICATIONS:  Current Outpatient Medications  Medication Sig Dispense Refill  . albuterol (PROVENTIL HFA;VENTOLIN HFA) 108 (90 Base) MCG/ACT inhaler Inhale 2 puffs into the lungs every 6 (six) hours as needed for  wheezing or shortness of breath. 1 Inhaler 2  . aspirin 325 MG tablet Take 650 mg by mouth daily.     . Chlorpheniramine-Phenylephrine (SINUS & ALLERGY) 4-10 MG tablet Take 1 tablet by mouth daily as needed.     . diphenhydrAMINE-zinc acetate (BENADRYL EXTRA STRENGTH) cream Apply 1 application topically 3 (three) times daily as needed for itching. 28 g 0  . fluticasone (FLONASE) 50 MCG/ACT nasal spray Place 1 spray into both nostrils daily. 16 g 2  . levothyroxine (SYNTHROID) 137 MCG tablet Take 1 tablet (137 mcg total) by mouth daily before breakfast. 30 tablet 1  . lidocaine-prilocaine (EMLA) cream Apply over port 1-2 hours prior to chemotherapy treatment.  Cover with plastic wrap. 30 g 1  . Multiple Vitamin (MULTI-VITAMINS) TABS Take 1 tablet by mouth daily.     [name]  ondansetron (ZOFRAN) 8 MG tablet Take 1 tablet (8 mg total) by mouth every 8 (eight) hours as needed for nausea or vomiting. 60 tablet 1  . pantoprazole (PROTONIX) 40 MG tablet Take 1 tablet (40 mg total) by mouth daily. 30 tablet 2  . prochlorperazine (COMPAZINE) 10 MG tablet Take 1 tablet (10 mg total) by mouth every 6 (six) hours as needed for nausea or vomiting. 60 tablet 2  . tiotropium (SPIRIVA HANDIHALER) 18 MCG inhalation capsule Place 1 capsule (18 mcg total) into inhaler and inhale daily. 30 capsule 1  . triamcinolone ointment (KENALOG) 0.5 % Apply 1 application topically 3 (three) times daily as needed. 30 g 0  . predniSONE (DELTASONE) 50 MG tablet Take 2 tablets daily. 20 tablet 0   No current facility-administered medications for this visit.    Facility-Administered Medications Ordered in Other Visits  Medication Dose Route Frequency Provider Last Rate Last Dose  . sodium chloride flush (NS) 0.9 % injection 10 mL  10 mL Intravenous PRN [name], MD   10 mL at 04/03/17 3016      .  PHYSICAL EXAMINATION: ECOG PERFORMANCE STATUS: 1 - Symptomatic but completely ambulatory Vitals:   11/27/17 0848  BP: (!) 81/49    Pulse: 83  Resp: 18  Temp: (!) 97.5 F (36.4 C)  SpO2: 91%   Filed Weights   11/27/17 0848  Weight: 203 lb 3 oz (92.2 kg)  Physical Exam  Constitutional: He is oriented to person, place, and time and well-developed, well-nourished, and in no distress. No distress.  HENT:  Head: Normocephalic and atraumatic.  Right Ear: External ear normal.  Left Ear: External ear normal.  Nose: Nose normal.  Mouth/Throat: Oropharynx is clear and moist. No oropharyngeal exudate.  Eyes: Pupils are equal, round, and reactive to light. Conjunctivae and EOM are normal. Right eye exhibits no discharge. Left eye exhibits no discharge. No scleral icterus.  Erythematous conjunctiva.   Neck: Normal range of motion. Neck supple. No JVD present.  Cardiovascular: Normal rate, regular rhythm and normal heart sounds.  No murmur heard. Pulmonary/Chest: Effort normal and breath sounds normal. No stridor. No respiratory distress. He has no wheezes. He has no rales. He exhibits no tenderness.  Decreased sound bilaterally lower lobe.   Abdominal: Soft. Bowel sounds are normal. He exhibits no distension and no mass. There is no tenderness. There is no rebound.  Musculoskeletal: Normal range of motion. He exhibits no edema or tenderness.  Lymphadenopathy:    He has no cervical adenopathy.  Neurological: He is alert and oriented to person, place, and time. No cranial nerve deficit. He exhibits normal muscle tone. Coordination normal.  Skin: Skin is warm and dry. No rash noted. He is not diaphoretic. No erythema.  Posterior right shoulder erythematous rash.   Psychiatric: Memory, affect and judgment normal.   LABORATORY DATA:  I have reviewed the data as listed Lab Results  Component Value Date   WBC 5.1 11/27/2017   HGB 12.0 (L) 11/27/2017   HCT 34.5 (L) 11/27/2017   MCV 94.4 11/27/2017   PLT 218 11/27/2017   Recent Labs    11/13/17 0843 11/19/17 0956 11/21/17 0833 11/27/17 0803  NA 137 135 137 134*  K  3.9 4.1 4.2 4.0  CL 108 104 108 103  CO2 22 23 22 23   GLUCOSE 108* 105* 104* 121*  BUN 24* 32* 30* 27*  CREATININE 1.81* 1.75* 1.50* 1.62*  CALCIUM 8.8* 9.2 8.6* 8.6*  GFRNONAA 36* 38* 45* 41*  GFRAA 42* 44* 53* 48*  PROT 6.5 6.7  --  6.4*  ALBUMIN 3.5 3.6  --  3.6  AST 32 37  --  24  ALT 26 30  --  21  ALKPHOS 52 47  --  40  BILITOT 0.5 0.7  --  0.7     ASSESSMENT & PLAN:  Cancer Staging Adenocarcinoma of left lung, stage 3 (HCC) Staging form: Lung, AJCC 8th Edition - Clinical stage from 02/14/2017: Stage IIIB (cT2b, cN3, cM0) - Signed by [name], MD on 02/14/2017  1. Adenocarcinoma of left lung, stage 3 ([place])   2. Hypotension due to drugs   3. Encounter for antineoplastic immunotherapy    #Stage IIIb lung adenocarcinoma, status post concurrent chemoradiation, currently on maintenance immunotherapy.  Hold immunotherapy today due to weakness.   # Hypotension: etiology unknown. Reports drinking about 48 oz of water daily. No vomiting, diarrhea. Not on any antihypertensives.   Urgently need to rule out immunotherapy related hypophysitis, or adrenal insufficiency.  Check ACTH, cortisol, FSH and LH.  Stat MRI brain to evaluate pituitary.  Start with 1mg /kg prednisone today.  Return visit: Follow up after MRI to discuss future plan.   All questions were answered. The patient knows to call the clinic with any problems questions or concerns. Total face to face encounter time for this patient visit was 25 min. >50% of the time was  spent in counseling and coordination of care   [name], MD, PhD Hematology [place] [facility] Pager- [phone] 11/27/2017

## 2017-11-27 NOTE — Progress Notes (Signed)
Patient here today for follow up and chemotherapy.  Patient states that he has had some vomiting, last time was this morning, and has nausea.  Patient c/o intermittent right hand pain, left wrist pain and neck pain that seams to flare up based on the weather.

## 2017-11-28 ENCOUNTER — Inpatient Hospital Stay (HOSPITAL_BASED_OUTPATIENT_CLINIC_OR_DEPARTMENT_OTHER): Payer: PPO | Admitting: Oncology

## 2017-11-28 ENCOUNTER — Other Ambulatory Visit: Payer: Self-pay

## 2017-11-28 ENCOUNTER — Encounter: Payer: Self-pay | Admitting: Oncology

## 2017-11-28 VITALS — BP 126/76 | HR 86 | Temp 96.8°F | Resp 18 | Wt 205.1 lb

## 2017-11-28 DIAGNOSIS — I952 Hypotension due to drugs: Secondary | ICD-10-CM

## 2017-11-28 DIAGNOSIS — C770 Secondary and unspecified malignant neoplasm of lymph nodes of head, face and neck: Secondary | ICD-10-CM

## 2017-11-28 DIAGNOSIS — R21 Rash and other nonspecific skin eruption: Secondary | ICD-10-CM

## 2017-11-28 DIAGNOSIS — Z7952 Long term (current) use of systemic steroids: Secondary | ICD-10-CM

## 2017-11-28 DIAGNOSIS — J439 Emphysema, unspecified: Secondary | ICD-10-CM

## 2017-11-28 DIAGNOSIS — E2740 Unspecified adrenocortical insufficiency: Secondary | ICD-10-CM | POA: Insufficient documentation

## 2017-11-28 DIAGNOSIS — E032 Hypothyroidism due to medicaments and other exogenous substances: Secondary | ICD-10-CM

## 2017-11-28 DIAGNOSIS — Z9221 Personal history of antineoplastic chemotherapy: Secondary | ICD-10-CM | POA: Diagnosis not present

## 2017-11-28 DIAGNOSIS — R531 Weakness: Secondary | ICD-10-CM

## 2017-11-28 DIAGNOSIS — C3492 Malignant neoplasm of unspecified part of left bronchus or lung: Secondary | ICD-10-CM

## 2017-11-28 DIAGNOSIS — Z923 Personal history of irradiation: Secondary | ICD-10-CM

## 2017-11-28 DIAGNOSIS — Z5112 Encounter for antineoplastic immunotherapy: Secondary | ICD-10-CM | POA: Diagnosis not present

## 2017-11-28 DIAGNOSIS — Z7982 Long term (current) use of aspirin: Secondary | ICD-10-CM | POA: Diagnosis not present

## 2017-11-28 DIAGNOSIS — C3412 Malignant neoplasm of upper lobe, left bronchus or lung: Secondary | ICD-10-CM | POA: Diagnosis not present

## 2017-11-28 DIAGNOSIS — Z79899 Other long term (current) drug therapy: Secondary | ICD-10-CM

## 2017-11-28 DIAGNOSIS — Z87891 Personal history of nicotine dependence: Secondary | ICD-10-CM

## 2017-11-28 DIAGNOSIS — R5383 Other fatigue: Secondary | ICD-10-CM | POA: Diagnosis not present

## 2017-11-28 LAB — FOLLICLE STIMULATING HORMONE: FSH: 4.6 m[IU]/mL (ref 1.5–12.4)

## 2017-11-28 LAB — ACTH: C206 ACTH: 11.7 pg/mL (ref 7.2–63.3)

## 2017-11-28 LAB — LUTEINIZING HORMONE: LH: 7.1 m[IU]/mL (ref 1.7–8.6)

## 2017-11-28 MED ORDER — PANTOPRAZOLE SODIUM 40 MG PO TBEC: 40.0000 mg | DELAYED_RELEASE_TABLET | Freq: Every day | ORAL | 2 refills | Status: DC

## 2017-11-28 MED ORDER — PREDNISONE 50 MG PO TABS: ORAL_TABLET | ORAL | 0 refills | Status: DC

## 2017-11-28 MED ORDER — PREDNISONE 20 MG PO TABS: 20.0000 mg | ORAL_TABLET | Freq: Every day | ORAL | 0 refills | Status: DC

## 2017-11-28 MED ORDER — FLUDROCORTISONE ACETATE 0.1 MG PO TABS: 0.1000 mg | ORAL_TABLET | Freq: Every day | ORAL | 0 refills | Status: DC

## 2017-11-28 NOTE — Progress Notes (Signed)
Hematology/Oncology Follow Up Note [facility] [phone] Fax:(336) [phone]   Patient Care Team: [name], MD as PCP - General (Family Medicine) [name], [name], MD (General Surgery) [name], MD as Consulting Physician (Oncology) [name], RN as Registered Nurse  Reason for Visit:  Follow up for Management of immunotherapy related side effects.  HISTORY OF PRESENTING ILLNESS:  Marc Gray III 72 y.o.  male who presents for treatment of Stage IIIB (cT2b, cN3, cM0)  Adenocarcinoma of lung.  # Patient is s/p [place] and taxol concurrent RT for treatment of Stage IIIB Lung cancer. He recieved additional lung boost RT Currently on Durvalumab maintenance.   # Colon CA was listed in his history, however reviewing his surgical pathology from colonoscopy on 04/15/2014, during which he had polyps removed and all are negative for cancer.patient is scheduled for colonoscopy screening this year.  # 5/1/ 2019 CT scan was discussed at tumor board and changes consistent with radiation changes  INTERVAL HISTORY Patient he presents for follow-up of immunotherapy related side effects. Yesterday, she was found to have profound hypotension, weakness despite well hydration. Stat MRI was done to rule out pituitary inflammation.  Which was negative Patient was started on 1 mg/kg  prednisone yesterday.  And he immediately feels symptom improvement. Fatigue is a lot better. Appetite has also increased. Today in the clinic his blood pressure is 126/76.  Denies any nausea vomiting. #Skin rash: stable, uses steroid cream.  as needed. #Hypothyroidism, taking synthroid 137mg  daily.    Review of Systems  Constitutional: Negative for appetite change, chills, diaphoresis, fatigue, fever and unexpected weight change.  HENT:   Negative for hearing loss, lump/mass, mouth sores, nosebleeds and sore throat.   Eyes: Negative for eye problems and icterus.         Eye irritation.  Respiratory: Positive for shortness of breath. Negative for chest tightness, cough, hemoptysis and wheezing.        Chronic SOB with exertion.   Cardiovascular: Negative for chest pain, leg swelling and palpitations.  Gastrointestinal: Negative for abdominal distention, abdominal pain, blood in stool, diarrhea, nausea, rectal pain and vomiting.  Endocrine: Negative for hot flashes.  Genitourinary: Negative for bladder incontinence, difficulty urinating, dysuria, frequency, hematuria and nocturia.   Musculoskeletal: Negative for arthralgias, back pain, flank pain, gait problem and myalgias.  Skin: Negative for itching and rash.  Neurological: Negative for gait problem, headaches, light-headedness, numbness and seizures.  Hematological: Negative for adenopathy. Does not bruise/bleed easily.  Psychiatric/Behavioral: Negative for confusion, decreased concentration and depression. The patient is not nervous/anxious.     MEDICAL HISTORY:  Past Medical History:  Diagnosis Date  . Anxiety   . Arthritis    SHOULDER  . Cataract   . Colon cancer ([place]) 2015   Pt states during his colonoscopy he had cancerous polyps removed.   . Depression    SINCE DIAGNOSIS  . Dyspnea    DOE  . Enlarged prostate   . Hypertension   . Lung cancer [facility])    Aug 2018  . Pain    DUE TO LUNG ISSUE    SURGICAL HISTORY: Past Surgical History:  Procedure Laterality Date  . CATARACT EXTRACTION W/ INTRAOCULAR LENS  IMPLANT, BILATERAL    . COLONOSCOPY    . EYE SURGERY    . LYMPH GLAND EXCISION N/A 02/09/2017   Procedure: CERVICAL LYMPH NODE BIOPSY;  Surgeon: Robert Bellow, MD;  Location: [facility] ORS;  Service: General;  Laterality: N/A;  .  PORTACATH PLACEMENT Right 02/23/2017   Procedure: INSERTION PORT-A-CATH;  Surgeon: Robert Bellow, MD;  Location: [facility] ORS;  Service: General;  Laterality: Right;  . PROSTATE SURGERY    . TONSILLECTOMY      SOCIAL HISTORY: Social History    Socioeconomic History  . Marital status: Married    Spouse name: Not on file  . Number of children: Not on file  . Years of education: Not on file  . Highest education level: Not on file  Occupational History  . Not on file  Social Needs  . Financial resource strain: Not on file  . Food insecurity:    Worry: Not on file    Inability: Not on file  . Transportation needs:    Medical: Not on file    Non-medical: Not on file  Tobacco Use  . Smoking status: Former Smoker    Packs/day: 1.00    Years: 53.00    Pack years: 53.00    Types: Cigarettes    Last attempt to quit: 04/14/2017    Years since quitting: 0.6  . Smokeless tobacco: Never Used  . Tobacco comment: Quit November 2018  Substance and Sexual Activity  . Alcohol use: Yes    Comment: occassional  . Drug use: No  . Sexual activity: Yes  Lifestyle  . Physical activity:    Days per week: Not on file    Minutes per session: Not on file  . Stress: Not on file  Relationships  . Social connections:    Talks on phone: Not on file    Gets together: Not on file    Attends religious service: Not on file    Active member of club or organization: Not on file    Attends meetings of clubs or organizations: Not on file    Relationship status: Not on file  . Intimate partner violence:    Fear of current or ex partner: Not on file    Emotionally abused: Not on file    Physically abused: Not on file    Forced sexual activity: Not on file  Other Topics Concern  . Not on file  Social History Narrative  . Not on file    FAMILY HISTORY: Family History  Problem Relation Age of Onset  . Breast cancer Maternal Grandmother   . Dementia Mother   . Diabetes Father   . Stroke Father     ALLERGIES:  has No Known Allergies.  MEDICATIONS:  Current Outpatient Medications  Medication Sig Dispense Refill  . albuterol (PROVENTIL HFA;VENTOLIN HFA) 108 (90 Base) MCG/ACT inhaler Inhale 2 puffs into the lungs every 6 (six) hours  as needed for wheezing or shortness of breath. 1 Inhaler 2  . aspirin 325 MG tablet Take 650 mg by mouth daily.     . Chlorpheniramine-Phenylephrine (SINUS & ALLERGY) 4-10 MG tablet Take 1 tablet by mouth daily as needed.     . diphenhydrAMINE-zinc acetate (BENADRYL EXTRA STRENGTH) cream Apply 1 application topically 3 (three) times daily as needed for itching. 28 g 0  . fluticasone (FLONASE) 50 MCG/ACT nasal spray Place 1 spray into both nostrils daily. 16 g 2  . levothyroxine (SYNTHROID) 137 MCG tablet Take 1 tablet (137 mcg total) by mouth daily before breakfast. 30 tablet 1  . lidocaine-prilocaine (EMLA) cream Apply over port 1-2 hours prior to chemotherapy treatment.  Cover with plastic wrap. 30 g 1  . Multiple Vitamin (MULTI-VITAMINS) TABS Take 1 tablet by mouth daily.     [name]  ondansetron (ZOFRAN) 8 MG tablet Take 1 tablet (8 mg total) by mouth every 8 (eight) hours as needed for nausea or vomiting. 60 tablet 1  . pantoprazole (PROTONIX) 40 MG tablet Take 1 tablet (40 mg total) by mouth daily. 30 tablet 2  . predniSONE (DELTASONE) 50 MG tablet Take 1 tab daily. 20 tablet 0  . prochlorperazine (COMPAZINE) 10 MG tablet Take 1 tablet (10 mg total) by mouth every 6 (six) hours as needed for nausea or vomiting. 60 tablet 2  . tiotropium (SPIRIVA HANDIHALER) 18 MCG inhalation capsule Place 1 capsule (18 mcg total) into inhaler and inhale daily. 30 capsule 1  . triamcinolone ointment (KENALOG) 0.5 % Apply 1 application topically 3 (three) times daily as needed. 30 g 0  . fludrocortisone (FLORINEF) 0.1 MG tablet Take 1 tablet (0.1 mg total) by mouth daily. 30 tablet 0  . predniSONE (DELTASONE) 20 MG tablet Take 1 tablet (20 mg total) by mouth daily with breakfast. 7 tablet 0   No current facility-administered medications for this visit.    Facility-Administered Medications Ordered in Other Visits  Medication Dose Route Frequency Provider Last Rate Last Dose  . sodium chloride flush (NS) 0.9 %  injection 10 mL  10 mL Intravenous PRN [name], MD   10 mL at 04/03/17 5956      .  PHYSICAL EXAMINATION: ECOG PERFORMANCE STATUS: 1 - Symptomatic but completely ambulatory Vitals:   11/28/17 1009  BP: 126/76  Pulse: 86  Resp: 18  Temp: (!) 96.8 F (36 C)  SpO2: 95%   Filed Weights   11/28/17 1009  Weight: 205 lb 1.6 oz (93 kg)  Physical Exam  Constitutional: He is oriented to person, place, and time and well-developed, well-nourished, and in no distress. No distress.  HENT:  Head: Normocephalic and atraumatic.  Right Ear: External ear normal.  Left Ear: External ear normal.  Nose: Nose normal.  Mouth/Throat: Oropharynx is clear and moist. No oropharyngeal exudate.  Eyes: Pupils are equal, round, and reactive to light. Conjunctivae and EOM are normal. Right eye exhibits no discharge. Left eye exhibits no discharge. No scleral icterus.  Erythematous conjunctiva.   Neck: Normal range of motion. Neck supple. No JVD present.  Cardiovascular: Normal rate, regular rhythm and normal heart sounds.  No murmur heard. Pulmonary/Chest: Effort normal and breath sounds normal. No stridor. No respiratory distress. He has no wheezes. He has no rales. He exhibits no tenderness.  Decreased sound bilaterally lower lobe.   Abdominal: Soft. Bowel sounds are normal. He exhibits no distension and no mass. There is no tenderness. There is no rebound.  Musculoskeletal: Normal range of motion. He exhibits no edema or tenderness.  Lymphadenopathy:    He has no cervical adenopathy.  Neurological: He is alert and oriented to person, place, and time. No cranial nerve deficit. He exhibits normal muscle tone. Coordination normal.  Skin: Skin is warm and dry. No rash noted. He is not diaphoretic. No erythema.  Posterior right shoulder erythematous rash.   Psychiatric: Memory, affect and judgment normal.   LABORATORY DATA:  I have reviewed the data as listed Lab Results  Component Value Date   WBC 5.1  11/27/2017   HGB 12.0 (L) 11/27/2017   HCT 34.5 (L) 11/27/2017   MCV 94.4 11/27/2017   PLT 218 11/27/2017   Recent Labs    11/13/17 0843 11/19/17 0956 11/21/17 0833 11/27/17 0803  NA 137 135 137 134*  K 3.9 4.1 4.2 4.0  CL 108 104  108 103  CO2 22 23 22 23   GLUCOSE 108* 105* 104* 121*  BUN 24* 32* 30* 27*  CREATININE 1.81* 1.75* 1.50* 1.62*  CALCIUM 8.8* 9.2 8.6* 8.6*  GFRNONAA 36* 38* 45* 41*  GFRAA 42* 44* 53* 48*  PROT 6.5 6.7  --  6.4*  ALBUMIN 3.5 3.6  --  3.6  AST 32 37  --  24  ALT 26 30  --  21  ALKPHOS 52 47  --  40  BILITOT 0.5 0.7  --  0.7     ASSESSMENT & PLAN:  Cancer Staging Adenocarcinoma of left lung, stage 3 (HCC) Staging form: Lung, AJCC 8th Edition - Clinical stage from 02/14/2017: Stage IIIB (cT2b, cN3, cM0) - Signed by [name], MD on 02/14/2017  1. Adenocarcinoma of left lung, stage 3 ([place])   2. Hypothyroidism due to medication   3. Hypotension due to drugs   4. Adrenal insufficiency (HCC)    #Stage IIIb lung adenocarcinoma, status post concurrent chemoradiation, currently on maintenance immunotherapy.  Continue to hold immunotherapy today    # Hypotension: MRI of brain was independently reviewed and discussed with patient and his wife.  There is no radiographic image evidence of pituitary inflammation.  Also lab results were reviewed and discussed in details with patient.  He has normal FSH, LH, slightly elevated TSH, extremely low morning cortisol level.  These are consistent with adrenal insufficiency, not consistent with hypophysitis.  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 Continue with prednisone.  He got a dose of 100 mg p.o. yesterday.  Symptoms significantly improved.  However he gets night. Discussed with patient that we will decrease his dose to 70 mg p.o. Daily.  Continue Protonix 40mg  daily, GI prophylaxis prescription sent to form. Add 0.1 mg fludrocortisone daily. 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  Continue Synthroid 137 MCG daily.  TSH has improved.  All questions were answered. The patient knows to call the clinic with any problems questions or concerns. Total face to face encounter time for this patient visit was 25 min. >50% of the time was  spent in counseling and coordination of care.   [name], MD, PhD Hematology Oncology [facility] Pager- [phone] 11/28/2017

## 2017-12-05 ENCOUNTER — Inpatient Hospital Stay: Payer: PPO

## 2017-12-05 ENCOUNTER — Other Ambulatory Visit: Payer: Self-pay

## 2017-12-05 ENCOUNTER — Inpatient Hospital Stay (HOSPITAL_BASED_OUTPATIENT_CLINIC_OR_DEPARTMENT_OTHER): Payer: PPO | Admitting: Oncology

## 2017-12-05 ENCOUNTER — Encounter: Payer: Self-pay | Admitting: Oncology

## 2017-12-05 DIAGNOSIS — E032 Hypothyroidism due to medicaments and other exogenous substances: Secondary | ICD-10-CM

## 2017-12-05 DIAGNOSIS — C770 Secondary and unspecified malignant neoplasm of lymph nodes of head, face and neck: Secondary | ICD-10-CM

## 2017-12-05 DIAGNOSIS — C3492 Malignant neoplasm of unspecified part of left bronchus or lung: Secondary | ICD-10-CM

## 2017-12-05 DIAGNOSIS — Z7982 Long term (current) use of aspirin: Secondary | ICD-10-CM | POA: Diagnosis not present

## 2017-12-05 DIAGNOSIS — Z923 Personal history of irradiation: Secondary | ICD-10-CM | POA: Diagnosis not present

## 2017-12-05 DIAGNOSIS — R5383 Other fatigue: Secondary | ICD-10-CM

## 2017-12-05 DIAGNOSIS — J439 Emphysema, unspecified: Secondary | ICD-10-CM | POA: Diagnosis not present

## 2017-12-05 DIAGNOSIS — R531 Weakness: Secondary | ICD-10-CM | POA: Diagnosis not present

## 2017-12-05 DIAGNOSIS — Z7952 Long term (current) use of systemic steroids: Secondary | ICD-10-CM | POA: Diagnosis not present

## 2017-12-05 DIAGNOSIS — Z79899 Other long term (current) drug therapy: Secondary | ICD-10-CM

## 2017-12-05 DIAGNOSIS — E2740 Unspecified adrenocortical insufficiency: Secondary | ICD-10-CM | POA: Diagnosis not present

## 2017-12-05 DIAGNOSIS — Z5112 Encounter for antineoplastic immunotherapy: Secondary | ICD-10-CM | POA: Diagnosis not present

## 2017-12-05 DIAGNOSIS — Z9221 Personal history of antineoplastic chemotherapy: Secondary | ICD-10-CM

## 2017-12-05 DIAGNOSIS — C3412 Malignant neoplasm of upper lobe, left bronchus or lung: Secondary | ICD-10-CM

## 2017-12-05 LAB — CBC WITH DIFFERENTIAL/PLATELET
Basophils Absolute: 0 10*3/uL (ref 0–0.1)
Basophils Relative: 0 %
EOS ABS: 0 10*3/uL (ref 0–0.7)
Eosinophils Relative: 0 %
HEMATOCRIT: 35.7 % — AB (ref 40.0–52.0)
HEMOGLOBIN: 12.3 g/dL — AB (ref 13.0–18.0)
LYMPHS ABS: 0.4 10*3/uL — AB (ref 1.0–3.6)
Lymphocytes Relative: 4 %
MCH: 32.5 pg (ref 26.0–34.0)
MCHC: 34.4 g/dL (ref 32.0–36.0)
MCV: 94.5 fL (ref 80.0–100.0)
Monocytes Absolute: 0.3 10*3/uL (ref 0.2–1.0)
Monocytes Relative: 3 %
NEUTROS PCT: 93 %
Neutro Abs: 8.7 10*3/uL — ABNORMAL HIGH (ref 1.4–6.5)
Platelets: 228 10*3/uL (ref 150–440)
RBC: 3.78 MIL/uL — AB (ref 4.40–5.90)
RDW: 14.8 % — AB (ref 11.5–14.5)
WBC: 9.4 10*3/uL (ref 3.8–10.6)

## 2017-12-05 LAB — COMPREHENSIVE METABOLIC PANEL
ALT: 24 U/L (ref 0–44)
AST: 21 U/L (ref 15–41)
Albumin: 3.6 g/dL (ref 3.5–5.0)
Alkaline Phosphatase: 52 U/L (ref 38–126)
Anion gap: 10 (ref 5–15)
BUN: 32 mg/dL — ABNORMAL HIGH (ref 8–23)
CHLORIDE: 101 mmol/L (ref 98–111)
CO2: 22 mmol/L (ref 22–32)
CREATININE: 1.24 mg/dL (ref 0.61–1.24)
Calcium: 8.8 mg/dL — ABNORMAL LOW (ref 8.9–10.3)
GFR, EST NON AFRICAN AMERICAN: 57 mL/min — AB (ref >60)
Glucose, Bld: 126 mg/dL — ABNORMAL HIGH (ref 70–99)
POTASSIUM: 4.7 mmol/L (ref 3.5–5.1)
Sodium: 133 mmol/L — ABNORMAL LOW (ref 135–145)
Total Bilirubin: 0.6 mg/dL (ref 0.3–1.2)
Total Protein: 6.5 g/dL (ref 6.5–8.1)

## 2017-12-05 NOTE — Progress Notes (Signed)
Hematology/Oncology Follow Up Note [facility] [phone] [phone]   Patient Care Team: [name], MD as PCP - General (Family Medicine) [name], [name], MD (General Surgery) [name], MD as Consulting Physician (Oncology) [name], RN as Registered Nurse  Reason for Visit:  Follow up for lung cancer, immunotherapy side effects, adrenal insufficiency.  HISTORY OF PRESENTING ILLNESS:  Marc Gray 72 y.o.  male who presents for treatment of Stage IIIB (cT2b, cN3, cM0)  Adenocarcinoma of lung.  # Patient is s/p [place] and taxol concurrent RT for treatment of Stage IIIB Lung cancer. He recieved additional lung boost RT Currently on Durvalumab maintenance.   # Colon CA was listed in his history, however reviewing his surgical pathology from colonoscopy on 04/15/2014, during which he had polyps removed and all are negative for cancer.patient is scheduled for colonoscopy screening this year.  # 5/1/ 2019 CT scan was discussed at tumor board and changes consistent with radiation changes  INTERVAL HISTORY Patient he presents for follow-up of immunotherapy side effects management, adrenal insufficiency.    Hypotension has resolved.  Today his blood pressure normalized. Improved appetite and less fatigued.  Review of Systems  Constitutional: Negative for appetite change, chills, diaphoresis, fatigue, fever and unexpected weight change.  HENT:   Negative for hearing loss, lump/mass, mouth sores, nosebleeds and sore throat.   Eyes: Negative for eye problems and icterus.       Eye irritation.  Respiratory: Positive for shortness of breath. Negative for chest tightness, cough, hemoptysis and wheezing.        Chronic SOB with exertion.   Cardiovascular: Negative for chest pain, leg swelling and palpitations.  Gastrointestinal: Negative for abdominal distention, abdominal pain, blood in stool, diarrhea, nausea, rectal pain  and vomiting.  Endocrine: Negative for hot flashes.  Genitourinary: Negative for bladder incontinence, difficulty urinating, dysuria, frequency, hematuria and nocturia.   Musculoskeletal: Negative for arthralgias, back pain, flank pain, gait problem and myalgias.  Skin: Negative for itching and rash.  Neurological: Negative for dizziness, gait problem, headaches, light-headedness, numbness and seizures.  Hematological: Negative for adenopathy. Does not bruise/bleed easily.  Psychiatric/Behavioral: Negative for confusion, decreased concentration and depression. The patient is not nervous/anxious.     MEDICAL HISTORY:  Past Medical History:  Diagnosis Date  . Anxiety   . Arthritis    SHOULDER  . Cataract   . Colon cancer ([place]) 2015   Pt states during his colonoscopy he had cancerous polyps removed.   . Depression    SINCE DIAGNOSIS  . Dyspnea    DOE  . Enlarged prostate   . Hypertension   . Lung cancer [facility])    Aug 2018  . Pain    DUE TO LUNG ISSUE    SURGICAL HISTORY: Past Surgical History:  Procedure Laterality Date  . CATARACT EXTRACTION W/ INTRAOCULAR LENS  IMPLANT, BILATERAL    . COLONOSCOPY    . EYE SURGERY    . LYMPH GLAND EXCISION N/A 02/09/2017   Procedure: CERVICAL LYMPH NODE BIOPSY;  Surgeon: Robert Bellow, MD;  Location: [place] [facility];  Service: General;  Laterality: N/A;  . PORTACATH PLACEMENT Right 02/23/2017   Procedure: INSERTION PORT-A-CATH;  Surgeon: Robert Bellow, MD;  Location: [facility];  Service: General;  Laterality: Right;  . PROSTATE SURGERY    . TONSILLECTOMY      SOCIAL HISTORY: Social History   Socioeconomic History  . Marital status: Married    Spouse name: Not on file  .  Number of children: Not on file  . Years of education: Not on file  . Highest education level: Not on file  Occupational History  . Not on file  Social Needs  . Financial resource strain: Not on file  . Food insecurity:    Worry: Not on file    Inability: Not on  file  . Transportation needs:    Medical: Not on file    Non-medical: Not on file  Tobacco Use  . Smoking status: Former Smoker    Packs/day: 1.00    Years: 53.00    Pack years: 53.00    Types: Cigarettes    Last attempt to quit: 04/14/2017    Years since quitting: 0.6  . Smokeless tobacco: Never Used  . Tobacco comment: Quit November 2018  Substance and Sexual Activity  . Alcohol use: Yes    Comment: occassional  . Drug use: No  . Sexual activity: Yes  Lifestyle  . Physical activity:    Days per week: Not on file    Minutes per session: Not on file  . Stress: Not on file  Relationships  . Social connections:    Talks on phone: Not on file    Gets together: Not on file    Attends religious service: Not on file    Active member of club or organization: Not on file    Attends meetings of clubs or organizations: Not on file    Relationship status: Not on file  . Intimate partner violence:    Fear of current or ex partner: Not on file    Emotionally abused: Not on file    Physically abused: Not on file    Forced sexual activity: Not on file  Other Topics Concern  . Not on file  Social History Narrative  . Not on file    FAMILY HISTORY: Family History  Problem Relation Age of Onset  . Breast cancer Maternal Grandmother   . Dementia Mother   . Diabetes Father   . Stroke Father     ALLERGIES:  has No Known Allergies.  MEDICATIONS:  Current Outpatient Medications  Medication Sig Dispense Refill  . albuterol (PROVENTIL HFA;VENTOLIN HFA) 108 (90 Base) MCG/ACT inhaler Inhale 2 puffs into the lungs every 6 (six) hours as needed for wheezing or shortness of breath. 1 Inhaler 2  . aspirin 325 MG tablet Take 650 mg by mouth daily.     . Chlorpheniramine-Phenylephrine (SINUS & ALLERGY) 4-10 MG tablet Take 1 tablet by mouth daily as needed.     . diphenhydrAMINE-zinc acetate (BENADRYL EXTRA STRENGTH) cream Apply 1 application topically 3 (three) times daily as needed for  itching. 28 g 0  . fludrocortisone (FLORINEF) 0.1 MG tablet Take 1 tablet (0.1 mg total) by mouth daily. 30 tablet 0  . fluticasone (FLONASE) 50 MCG/ACT nasal spray Place 1 spray into both nostrils daily. 16 g 2  . levothyroxine (SYNTHROID) 137 MCG tablet Take 1 tablet (137 mcg total) by mouth daily before breakfast. 30 tablet 1  . lidocaine-prilocaine (EMLA) cream Apply over port 1-2 hours prior to chemotherapy treatment.  Cover with plastic wrap. 30 g 1  . Multiple Vitamin (MULTI-VITAMINS) TABS Take 1 tablet by mouth daily.     . ondansetron (ZOFRAN) 8 MG tablet Take 1 tablet (8 mg total) by mouth every 8 (eight) hours as needed for nausea or vomiting. 60 tablet 1  . pantoprazole (PROTONIX) 40 MG tablet Take 1 tablet (40 mg total) by mouth daily.  30 tablet 2  . predniSONE (DELTASONE) 20 MG tablet Take 1 tablet (20 mg total) by mouth daily with breakfast. 7 tablet 0  . predniSONE (DELTASONE) 50 MG tablet Take 1 tab daily. 20 tablet 0  . prochlorperazine (COMPAZINE) 10 MG tablet Take 1 tablet (10 mg total) by mouth every 6 (six) hours as needed for nausea or vomiting. 60 tablet 2  . tiotropium (SPIRIVA HANDIHALER) 18 MCG inhalation capsule Place 1 capsule (18 mcg total) into inhaler and inhale daily. 30 capsule 1  . triamcinolone ointment (KENALOG) 0.5 % Apply 1 application topically 3 (three) times daily as needed. 30 g 0   No current facility-administered medications for this visit.    Facility-Administered Medications Ordered in Other Visits  Medication Dose Route Frequency Provider Last Rate Last Dose  . sodium chloride flush (NS) 0.9 % injection 10 mL  10 mL Intravenous PRN [name], MD   10 mL at 04/03/17 6283      .  PHYSICAL EXAMINATION: ECOG PERFORMANCE STATUS: 1 - Symptomatic but completely ambulatory There were no vitals filed for this visit. There were no vitals filed for this visit.Physical Exam  Constitutional: He is oriented to person, place, and time and well-developed,  well-nourished, and in no distress. No distress.  HENT:  Head: Normocephalic and atraumatic.  Right Ear: External ear normal.  Left Ear: External ear normal.  Nose: Nose normal.  Mouth/Throat: Oropharynx is clear and moist. No oropharyngeal exudate.  Eyes: Pupils are equal, round, and reactive to light. Conjunctivae and EOM are normal. Right eye exhibits no discharge. Left eye exhibits no discharge. No scleral icterus.  Erythematous conjunctiva.   Neck: Normal range of motion. Neck supple. No JVD present.  Cardiovascular: Normal rate, regular rhythm and normal heart sounds.  No murmur heard. Pulmonary/Chest: Effort normal and breath sounds normal. No stridor. No respiratory distress. He has no wheezes. He has no rales. He exhibits no tenderness.  Decreased sound bilaterally lower lobe.   Abdominal: Soft. Bowel sounds are normal. He exhibits no distension and no mass. There is no tenderness. There is no rebound.  Musculoskeletal: Normal range of motion. He exhibits no edema or tenderness.  Lymphadenopathy:    He has no cervical adenopathy.  Neurological: He is alert and oriented to person, place, and time. No cranial nerve deficit. He exhibits normal muscle tone. Coordination normal.  Skin: Skin is warm and dry. No rash noted. He is not diaphoretic. No erythema.  Posterior right shoulder erythematous rash.   Psychiatric: Memory, affect and judgment normal.   LABORATORY DATA:  I have reviewed the data as listed Lab Results  Component Value Date   WBC 9.4 12/05/2017   HGB 12.3 (L) 12/05/2017   HCT 35.7 (L) 12/05/2017   MCV 94.5 12/05/2017   PLT 228 12/05/2017   Recent Labs    11/19/17 0956 11/21/17 0833 11/27/17 0803 12/05/17 1341  NA 135 137 134* 133*  K 4.1 4.2 4.0 4.7  CL 104 108 103 101  CO2 23 22 23 22   GLUCOSE 105* 104* 121* 126*  BUN 32* 30* 27* 32*  CREATININE 1.75* 1.50* 1.62* 1.24  CALCIUM 9.2 8.6* 8.6* 8.8*  GFRNONAA 38* 45* 41* 57*  GFRAA 44* 53* 48* >60    PROT 6.7  --  6.4* 6.5  ALBUMIN 3.6  --  3.6 3.6  AST 37  --  24 21  ALT 30  --  21 24  ALKPHOS 47  --  40 52  BILITOT  0.7  --  0.7 0.6     ASSESSMENT & PLAN:  Cancer Staging Adenocarcinoma of left lung, stage 3 (HCC) Staging form: Lung, AJCC 8th Edition - Clinical stage from 02/14/2017: Stage IIIB (cT2b, cN3, cM0) - Signed by [name], MD on 02/14/2017  1. 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 Adrenal insufficiency (HCC)    #Stage IIIb lung adenocarcinoma, status post concurrent chemoradiation, currently on maintenance immunotherapy.  Continue hold immunotherapy.. Discussed with patient that we will repeat a CT scan in 3 weeks prior to resuming immunotherapy.  #Adrenal insufficiency, continue prednisone 70 mg daily, fludrocortisone 0.1 mg daily.  Follow-up with endocrinologist.  He has an appointment this Friday. Defer endocrinology for deciding if patient need to be switched to hydrocortisone.  #Hypothyroidism, continue Synthroid 137 MCG daily.  TSH has improved. 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  Continue Synthroid 137 MCG daily.  TSH has improved. Orders Placed This Encounter  Procedures  . CT Chest Wo Contrast    Standing Status:   Future    Standing Expiration Date:   12/05/2018    Order Specific Question:   Preferred imaging location?    Answer:   [facility]    Order Specific Question:   Radiology Contrast Protocol - do NOT remove file path    Answer:   [email or web address]   All questions were answered. The patient knows to call the clinic with any problems questions or concerns. Total face to face encounter time for this patient visit was 15 min. >50% of the time was  spent in counseling and coordination of care.   [name], MD, PhD Hematology Oncology [facility] Pager- [phone] 12/05/2017

## 2017-12-06 ENCOUNTER — Other Ambulatory Visit: Payer: Self-pay | Admitting: Oncology

## 2017-12-07 DIAGNOSIS — E2749 Other adrenocortical insufficiency: Secondary | ICD-10-CM | POA: Diagnosis not present

## 2017-12-07 DIAGNOSIS — E039 Hypothyroidism, unspecified: Secondary | ICD-10-CM | POA: Diagnosis not present

## 2017-12-07 DIAGNOSIS — C3492 Malignant neoplasm of unspecified part of left bronchus or lung: Secondary | ICD-10-CM | POA: Diagnosis not present

## 2017-12-17 ENCOUNTER — Ambulatory Visit
Admission: RE | Admit: 2017-12-17 | Discharge: 2017-12-17 | Disposition: A | Discharge status: Home / Self Care | Payer: PPO | Source: Ambulatory Visit | Attending: Oncology | Admitting: Oncology

## 2017-12-17 DIAGNOSIS — C3490 Malignant neoplasm of unspecified part of unspecified bronchus or lung: Secondary | ICD-10-CM | POA: Diagnosis not present

## 2017-12-17 DIAGNOSIS — I712 Thoracic aortic aneurysm, without rupture: Secondary | ICD-10-CM | POA: Insufficient documentation

## 2017-12-17 DIAGNOSIS — I700 Atherosclerosis of aorta: Secondary | ICD-10-CM | POA: Insufficient documentation

## 2017-12-17 DIAGNOSIS — C3492 Malignant neoplasm of unspecified part of left bronchus or lung: Secondary | ICD-10-CM

## 2017-12-17 DIAGNOSIS — S22029A Unspecified fracture of second thoracic vertebra, initial encounter for closed fracture: Secondary | ICD-10-CM | POA: Diagnosis not present

## 2017-12-17 DIAGNOSIS — I2510 Atherosclerotic heart disease of native coronary artery without angina pectoris: Secondary | ICD-10-CM | POA: Insufficient documentation

## 2017-12-17 DIAGNOSIS — X58XXXA Exposure to other specified factors, initial encounter: Secondary | ICD-10-CM | POA: Insufficient documentation

## 2017-12-17 DIAGNOSIS — I313 Pericardial effusion (noninflammatory): Secondary | ICD-10-CM | POA: Diagnosis not present

## 2017-12-17 DIAGNOSIS — Z923 Personal history of irradiation: Secondary | ICD-10-CM | POA: Diagnosis not present

## 2017-12-17 DIAGNOSIS — J432 Centrilobular emphysema: Secondary | ICD-10-CM | POA: Diagnosis not present

## 2017-12-17 DIAGNOSIS — Z5111 Encounter for antineoplastic chemotherapy: Secondary | ICD-10-CM | POA: Diagnosis not present

## 2017-12-17 DIAGNOSIS — Z510 Encounter for antineoplastic radiation therapy: Secondary | ICD-10-CM | POA: Diagnosis not present

## 2017-12-25 ENCOUNTER — Encounter: Payer: Self-pay | Admitting: Oncology

## 2017-12-25 ENCOUNTER — Inpatient Hospital Stay (HOSPITAL_BASED_OUTPATIENT_CLINIC_OR_DEPARTMENT_OTHER): Payer: PPO | Admitting: Oncology

## 2017-12-25 ENCOUNTER — Other Ambulatory Visit: Payer: Self-pay

## 2017-12-25 ENCOUNTER — Inpatient Hospital Stay: Payer: PPO

## 2017-12-25 VITALS — BP 119/77 | HR 62 | Temp 96.2°F | Wt 212.9 lb

## 2017-12-25 DIAGNOSIS — R531 Weakness: Secondary | ICD-10-CM

## 2017-12-25 DIAGNOSIS — Z5112 Encounter for antineoplastic immunotherapy: Secondary | ICD-10-CM | POA: Diagnosis not present

## 2017-12-25 DIAGNOSIS — X58XXXS Exposure to other specified factors, sequela: Secondary | ICD-10-CM | POA: Diagnosis not present

## 2017-12-25 DIAGNOSIS — J439 Emphysema, unspecified: Secondary | ICD-10-CM

## 2017-12-25 DIAGNOSIS — I10 Essential (primary) hypertension: Secondary | ICD-10-CM

## 2017-12-25 DIAGNOSIS — E2740 Unspecified adrenocortical insufficiency: Secondary | ICD-10-CM | POA: Diagnosis not present

## 2017-12-25 DIAGNOSIS — C3412 Malignant neoplasm of upper lobe, left bronchus or lung: Secondary | ICD-10-CM

## 2017-12-25 DIAGNOSIS — Z923 Personal history of irradiation: Secondary | ICD-10-CM | POA: Diagnosis not present

## 2017-12-25 DIAGNOSIS — Z79899 Other long term (current) drug therapy: Secondary | ICD-10-CM

## 2017-12-25 DIAGNOSIS — M4854XS Collapsed vertebra, not elsewhere classified, thoracic region, sequela of fracture: Secondary | ICD-10-CM

## 2017-12-25 DIAGNOSIS — C770 Secondary and unspecified malignant neoplasm of lymph nodes of head, face and neck: Secondary | ICD-10-CM

## 2017-12-25 DIAGNOSIS — Z9221 Personal history of antineoplastic chemotherapy: Secondary | ICD-10-CM | POA: Diagnosis not present

## 2017-12-25 DIAGNOSIS — E032 Hypothyroidism due to medicaments and other exogenous substances: Secondary | ICD-10-CM

## 2017-12-25 DIAGNOSIS — H109 Unspecified conjunctivitis: Secondary | ICD-10-CM

## 2017-12-25 DIAGNOSIS — C3492 Malignant neoplasm of unspecified part of left bronchus or lung: Secondary | ICD-10-CM

## 2017-12-25 DIAGNOSIS — T50905A Adverse effect of unspecified drugs, medicaments and biological substances, initial encounter: Secondary | ICD-10-CM

## 2017-12-25 DIAGNOSIS — S22000A Wedge compression fracture of unspecified thoracic vertebra, initial encounter for closed fracture: Secondary | ICD-10-CM

## 2017-12-25 DIAGNOSIS — Z87891 Personal history of nicotine dependence: Secondary | ICD-10-CM

## 2017-12-25 DIAGNOSIS — R5383 Other fatigue: Secondary | ICD-10-CM

## 2017-12-25 DIAGNOSIS — Z7982 Long term (current) use of aspirin: Secondary | ICD-10-CM

## 2017-12-25 DIAGNOSIS — Z7952 Long term (current) use of systemic steroids: Secondary | ICD-10-CM

## 2017-12-25 DIAGNOSIS — R635 Abnormal weight gain: Secondary | ICD-10-CM

## 2017-12-25 DIAGNOSIS — H10403 Unspecified chronic conjunctivitis, bilateral: Secondary | ICD-10-CM

## 2017-12-25 LAB — CBC WITH DIFFERENTIAL/PLATELET
Basophils Absolute: 0 10*3/uL (ref 0–0.1)
Basophils Relative: 0 %
EOS ABS: 0 10*3/uL (ref 0–0.7)
EOS PCT: 0 %
HCT: 39.1 % — ABNORMAL LOW (ref 40.0–52.0)
Hemoglobin: 13.1 g/dL (ref 13.0–18.0)
LYMPHS ABS: 0.5 10*3/uL — AB (ref 1.0–3.6)
LYMPHS PCT: 7 %
MCH: 32.6 pg (ref 26.0–34.0)
MCHC: 33.6 g/dL (ref 32.0–36.0)
MCV: 96.8 fL (ref 80.0–100.0)
MONOS PCT: 5 %
Monocytes Absolute: 0.3 10*3/uL (ref 0.2–1.0)
Neutro Abs: 6.2 10*3/uL (ref 1.4–6.5)
Neutrophils Relative %: 88 %
PLATELETS: 187 10*3/uL (ref 150–440)
RBC: 4.04 MIL/uL — AB (ref 4.40–5.90)
RDW: 15.9 % — ABNORMAL HIGH (ref 11.5–14.5)
WBC: 7 10*3/uL (ref 3.8–10.6)

## 2017-12-25 LAB — COMPREHENSIVE METABOLIC PANEL
ALBUMIN: 3.5 g/dL (ref 3.5–5.0)
ALT: 39 U/L (ref 0–44)
AST: 26 U/L (ref 15–41)
Alkaline Phosphatase: 44 U/L (ref 38–126)
Anion gap: 7 (ref 5–15)
BUN: 37 mg/dL — AB (ref 8–23)
CHLORIDE: 102 mmol/L (ref 98–111)
CO2: 26 mmol/L (ref 22–32)
Calcium: 8.8 mg/dL — ABNORMAL LOW (ref 8.9–10.3)
Creatinine, Ser: 1.26 mg/dL — ABNORMAL HIGH (ref 0.61–1.24)
GFR calc Af Amer: >60 mL/min (ref >60)
GFR, EST NON AFRICAN AMERICAN: 56 mL/min — AB (ref >60)
GLUCOSE: 140 mg/dL — AB (ref 70–99)
Potassium: 4.8 mmol/L (ref 3.5–5.1)
SODIUM: 135 mmol/L (ref 135–145)
Total Bilirubin: 0.4 mg/dL (ref 0.3–1.2)
Total Protein: 6.5 g/dL (ref 6.5–8.1)

## 2017-12-25 LAB — TSH: TSH: 5.299 u[IU]/mL — ABNORMAL HIGH (ref 0.350–4.500)

## 2017-12-25 MED ORDER — SODIUM CHLORIDE 0.9 % IV SOLN
10.0000 mg/kg | Freq: Once | INTRAVENOUS | Status: AC
  Administered 2017-12-25: 860 mg via INTRAVENOUS
  Filled 2017-12-25: qty 10

## 2017-12-25 MED ORDER — SODIUM CHLORIDE 0.9% FLUSH
10.0000 mL | INTRAVENOUS | Status: DC | PRN
  Filled 2017-12-25: qty 10

## 2017-12-25 MED ORDER — CALCIUM CARBONATE-VITAMIN D 500-200 MG-UNIT PO TABS: 1.0000 | ORAL_TABLET | Freq: Every day | ORAL | 1 refills | Status: DC

## 2017-12-25 MED ORDER — HEPARIN SOD (PORK) LOCK FLUSH 100 UNIT/ML IV SOLN
500.0000 [IU] | Freq: Once | INTRAVENOUS | Status: AC | PRN
  Administered 2017-12-25: 500 [IU]
  Filled 2017-12-25: qty 5

## 2017-12-25 MED ORDER — SODIUM CHLORIDE 0.9 % IV SOLN
Freq: Once | INTRAVENOUS | Status: AC
  Administered 2017-12-25: 10:00:00 via INTRAVENOUS
  Filled 2017-12-25: qty 1000

## 2017-12-25 NOTE — Progress Notes (Signed)
Hematology/Oncology Follow Up Note [facility] [phone] [phone]   Patient Care Team: [name], MD as PCP - General (Family Medicine) [name], [name], MD (General Surgery) [name], MD as Consulting Physician (Oncology) [name], RN as Registered Nurse  Reason for Visit:  Follow up for lung cancer, immunotherapy side effects, adrenal insufficiency.  HISTORY OF PRESENTING ILLNESS:  Marc Gray 72 y.o.  male who presents for treatment of Stage IIIB (cT2b, cN3, cM0)  Adenocarcinoma of lung.  # Patient is s/p [place] and taxol concurrent RT for treatment of Stage IIIB Lung cancer. He recieved additional lung boost RT Currently on Durvalumab maintenance.   # Colon CA was listed in his history, however reviewing his surgical pathology from colonoscopy on 04/15/2014, during which he had polyps removed and all are negative for cancer.patient is scheduled for colonoscopy screening this year.  # 5/1/ 2019 CT scan was discussed at tumor board and changes consistent with radiation changes  INTERVAL HISTORY Patient he presents for follow-up of for assessment of immunotherapy and monitoring side effects   #Adrenal insufficiency, hypotension has resolved.  Fatigue has significantly improved.  He has established care with endocrinologist Dr. Gabriel Carina, on a tapering course of prednisone, reduce to 10 mg q. 5 days. He has follow-up appointment with Dr. Elisabeth Cara for further testing and dose adjusting. Fludrocortisone has been discontinued as glucocorticoids has sufficient mineral corticoid activity to sustain his blood pressure.\  #Acquired hypothyroidism, on levothyroxine.  Following up with endocrinology.  Most recent TSH has improved and is stable. #Shortness of breath, chronic.  Stable. #Eye irritation, improved. #Weight gain, he has gained 7 pounds during 3-week interval.  Reports having extremely good appetite and snacks a  lot.   Review of Systems  Constitutional: Negative for appetite change, chills, diaphoresis, fatigue, fever and unexpected weight change.  HENT:   Negative for hearing loss, lump/mass, mouth sores, nosebleeds and sore throat.   Eyes: Negative for eye problems and icterus.       Eye irritation.  Respiratory: Positive for shortness of breath. Negative for chest tightness, cough, hemoptysis and wheezing.        Chronic SOB with exertion.   Cardiovascular: Negative for chest pain, leg swelling and palpitations.  Gastrointestinal: Negative for abdominal distention, abdominal pain, blood in stool, diarrhea, nausea, rectal pain and vomiting.  Endocrine: Negative for hot flashes.  Genitourinary: Negative for bladder incontinence, difficulty urinating, dysuria, frequency, hematuria and nocturia.   Musculoskeletal: Negative for arthralgias, back pain, flank pain, gait problem and myalgias.  Skin: Negative for itching and rash.  Neurological: Negative for dizziness, gait problem, headaches, light-headedness, numbness and seizures.  Hematological: Negative for adenopathy. Does not bruise/bleed easily.  Psychiatric/Behavioral: Negative for confusion, decreased concentration and depression. The patient is not nervous/anxious.     MEDICAL HISTORY:  Past Medical History:  Diagnosis Date  . Anxiety   . Arthritis    SHOULDER  . Cataract   . Colon cancer ([place]) 2015   Pt states during his colonoscopy he had cancerous polyps removed.   . Depression    SINCE DIAGNOSIS  . Dyspnea    DOE  . Enlarged prostate   . Hypertension   . Lung cancer [facility])    Aug 2018  . Pain    DUE TO LUNG ISSUE    SURGICAL HISTORY: Past Surgical History:  Procedure Laterality Date  . CATARACT EXTRACTION W/ INTRAOCULAR LENS  IMPLANT, BILATERAL    . COLONOSCOPY    .  EYE SURGERY    . LYMPH GLAND EXCISION N/A 02/09/2017   Procedure: CERVICAL LYMPH NODE BIOPSY;  Surgeon: Robert Bellow, MD;  Location: [facility];   Service: General;  Laterality: N/A;  . PORTACATH PLACEMENT Right 02/23/2017   Procedure: INSERTION PORT-A-CATH;  Surgeon: Robert Bellow, MD;  Location: [facility];  Service: General;  Laterality: Right;  . PROSTATE SURGERY    . TONSILLECTOMY      SOCIAL HISTORY: Social History   Socioeconomic History  . Marital status: Married    Spouse name: Not on file  . Number of children: Not on file  . Years of education: Not on file  . Highest education level: Not on file  Occupational History  . Not on file  Social Needs  . Financial resource strain: Not on file  . Food insecurity:    Worry: Not on file    Inability: Not on file  . Transportation needs:    Medical: Not on file    Non-medical: Not on file  Tobacco Use  . Smoking status: Former Smoker    Packs/day: 1.00    Years: 53.00    Pack years: 53.00    Types: Cigarettes    Last attempt to quit: 04/14/2017    Years since quitting: 0.6  . Smokeless tobacco: Never Used  . Tobacco comment: Quit November 2018  Substance and Sexual Activity  . Alcohol use: Yes    Comment: occassional  . Drug use: No  . Sexual activity: Yes  Lifestyle  . Physical activity:    Days per week: Not on file    Minutes per session: Not on file  . Stress: Not on file  Relationships  . Social connections:    Talks on phone: Not on file    Gets together: Not on file    Attends religious service: Not on file    Active member of club or organization: Not on file    Attends meetings of clubs or organizations: Not on file    Relationship status: Not on file  . Intimate partner violence:    Fear of current or ex partner: Not on file    Emotionally abused: Not on file    Physically abused: Not on file    Forced sexual activity: Not on file  Other Topics Concern  . Not on file  Social History Narrative  . Not on file    FAMILY HISTORY: Family History  Problem Relation Age of Onset  . Breast cancer Maternal Grandmother   . Dementia Mother     . Diabetes Father   . Stroke Father     ALLERGIES:  has No Known Allergies.  MEDICATIONS:  Current Outpatient Medications  Medication Sig Dispense Refill  . aspirin 325 MG tablet Take 650 mg by mouth daily.     . budesonide-formoterol (SYMBICORT) 160-4.5 MCG/ACT inhaler Inhale into the lungs. Inhale 2 inhalations into the lungs 2 times daily    . Chlorpheniramine-Phenylephrine (SINUS & ALLERGY) 4-10 MG tablet Take 1 tablet by mouth daily as needed.     . diphenhydrAMINE-zinc acetate (BENADRYL EXTRA STRENGTH) cream Apply 1 application topically 3 (three) times daily as needed for itching. 28 g 0  . fluticasone (FLONASE) 50 MCG/ACT nasal spray Place 1 spray into both nostrils daily. 16 g 2  . levothyroxine (SYNTHROID) 137 MCG tablet Take 1 tablet (137 mcg total) by mouth daily before breakfast. 30 tablet 1  . lidocaine-prilocaine (EMLA) cream Apply over port 1-2 hours prior  to chemotherapy treatment.  Cover with plastic wrap. 30 g 1  . Melatonin 10 MG TABS Take 10 mg by mouth at bedtime as needed.    . Multiple Vitamin (MULTI-VITAMINS) TABS Take 1 tablet by mouth daily.     . ondansetron (ZOFRAN) 8 MG tablet Take 1 tablet (8 mg total) by mouth every 8 (eight) hours as needed for nausea or vomiting. 60 tablet 1  . pantoprazole (PROTONIX) 40 MG tablet Take 1 tablet (40 mg total) by mouth daily. 30 tablet 2  . predniSONE (DELTASONE) 20 MG tablet Take 1 tablet (20 mg total) by mouth daily with breakfast. 7 tablet 0  . PROAIR HFA 108 (90 Base) MCG/ACT inhaler INHALE 2 PUFFS BY MOUTH EVERY 6 HOURS AS NEEDED 8.5 g 0  . prochlorperazine (COMPAZINE) 10 MG tablet Take 1 tablet (10 mg total) by mouth every 6 (six) hours as needed for nausea or vomiting. 60 tablet 2  . tiotropium (SPIRIVA HANDIHALER) 18 MCG inhalation capsule Place 1 capsule (18 mcg total) into inhaler and inhale daily. 30 capsule 1  . triamcinolone ointment (KENALOG) 0.5 % Apply 1 application topically 3 (three) times daily as needed.  30 g 0  . calcium-vitamin D (OSCAL WITH D) 500-200 MG-UNIT tablet Take 1 tablet by mouth daily. 90 tablet 1  . fludrocortisone (FLORINEF) 0.1 MG tablet Take 1 tablet (0.1 mg total) by mouth daily. (Patient not taking: Reported on 12/25/2017) 30 tablet 0  . predniSONE (DELTASONE) 50 MG tablet Take 1 tab daily. (Patient not taking: Reported on 12/25/2017) 20 tablet 0   No current facility-administered medications for this visit.    Facility-Administered Medications Ordered in Other Visits  Medication Dose Route Frequency Provider Last Rate Last Dose  . sodium chloride flush (NS) 0.9 % injection 10 mL  10 mL Intravenous PRN [name], MD   10 mL at 04/03/17 2725      .  PHYSICAL EXAMINATION: ECOG PERFORMANCE STATUS: 1 - Symptomatic but completely ambulatory Vitals:   12/25/17 0946  BP: 119/77  Pulse: 62  Temp: (!) 96.2 F (35.7 C)  SpO2: 97%   Filed Weights   12/25/17 0946  Weight: 212 lb 14.4 oz (96.6 kg)  Physical Exam  Constitutional: He is oriented to person, place, and time and well-developed, well-nourished, and in no distress. No distress.  HENT:  Head: Normocephalic and atraumatic.  Right Ear: External ear normal.  Left Ear: External ear normal.  Nose: Nose normal.  Mouth/Throat: Oropharynx is clear and moist. No oropharyngeal exudate.  Eyes: Pupils are equal, round, and reactive to light. Conjunctivae and EOM are normal. Right eye exhibits no discharge. Left eye exhibits no discharge. No scleral icterus.  Erythematous conjunctiva.   Neck: Normal range of motion. Neck supple. No JVD present.  Cardiovascular: Normal rate, regular rhythm and normal heart sounds.  No murmur heard. Pulmonary/Chest: Effort normal and breath sounds normal. No stridor. No respiratory distress. He has no wheezes. He has no rales. He exhibits no tenderness.  Decreased sound bilaterally lower lobe.   Abdominal: Soft. Bowel sounds are normal. He exhibits no distension and no mass. There is no  tenderness. There is no rebound.  Musculoskeletal: Normal range of motion. He exhibits no edema or tenderness.  Lymphadenopathy:    He has no cervical adenopathy.  Neurological: He is alert and oriented to person, place, and time. No cranial nerve deficit. He exhibits normal muscle tone. Coordination normal.  Skin: Skin is warm and dry. No rash noted. He  is not diaphoretic. No erythema.  Posterior right shoulder erythematous rash.   Psychiatric: Memory, affect and judgment normal.   LABORATORY DATA:  I have reviewed the data as listed Lab Results  Component Value Date   WBC 7.0 12/25/2017   HGB 13.1 12/25/2017   HCT 39.1 (L) 12/25/2017   MCV 96.8 12/25/2017   PLT 187 12/25/2017   Recent Labs    11/27/17 0803 12/05/17 1341 12/25/17 0927  NA 134* 133* 135  K 4.0 4.7 4.8  CL 103 101 102  CO2 23 22 26   GLUCOSE 121* 126* 140*  BUN 27* 32* 37*  CREATININE 1.62* 1.24 1.26*  CALCIUM 8.6* 8.8* 8.8*  GFRNONAA 41* 57* 56*  GFRAA 48* >60 >60  PROT 6.4* 6.5 6.5  ALBUMIN 3.6 3.6 3.5  AST 24 21 26   ALT 21 24 39  ALKPHOS 40 52 44  BILITOT 0.7 0.6 0.4     ASSESSMENT & PLAN:  Cancer Staging Adenocarcinoma of left lung, stage 3 (HCC) Staging form: Lung, AJCC 8th Edition - Clinical stage from 02/14/2017: Stage IIIB (cT2b, cN3, cM0) - Signed by [name], MD on 02/14/2017  1. Compression fracture of body of thoracic vertebra (HCC)   2. Hypothyroidism due to medication   3. Adrenal insufficiency ([place])   4. Encounter for antineoplastic immunotherapy   5. Chronic conjunctivitis of both eyes, unspecified chronic conjunctivitis type   6. Adenocarcinoma of left lung, stage 3 ([place])   7. Weight gain due to medication    #Stage IIIb lung adenocarcinoma,  CAT scan was independently reviewed and discussed with patient.  Image showed a stable masslike consolidation in the left upper lobe with evolving radiation therapy changes in the medial left hemithorax.  Subpleural right upper lobe nodule  is unchanged.  Small pericardial effusion, slightly increased from prior. Patient had a new T2 superior endplate compression fracture. Aortic atherosclerosis, three-vessel coronary artery calcification.  Ascending aortic aneurysm.  Emphysema.  Stable disease.  Counts are reviewed and vital signs stable.  Resume immunotherapy with durvalumab.  See below  #Adrenal insufficiency, follow-up with Dr.Solum.  Patient is on tapering course of steroids.  Blood pressure stable. #Hypothyroidism, continue Synthroid 137 MCG daily.  TSH is improved. #Weight gain, likely secondary to glucocorticoids use.  Discussed with patient about lifestyle modification, healthy diet, and exercise. 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 Check bone density scan. # Slight pericardia effusion. Follow up with cardiology  Orders Placed This Encounter  Procedures  . DG Bone Density    Standing Status:   Future    Standing Expiration Date:   02/24/2019    Order Specific Question:   Reason for Exam (SYMPTOM  OR DIAGNOSIS REQUIRED)    Answer:   compression fracture    Order Specific Question:   Preferred imaging location?    Answer:   [facility]   All questions were answered. The patient knows to call the clinic with any problems questions or concerns. [name], MD, PhD Hematology Oncology [facility] Pager- [phone] 12/25/2017

## 2017-12-25 NOTE — Progress Notes (Signed)
Patient here for follow up. He states that his sleeping is getting better.

## 2017-12-30 ENCOUNTER — Other Ambulatory Visit: Payer: Self-pay | Admitting: Oncology

## 2017-12-31 ENCOUNTER — Ambulatory Visit
Admission: RE | Admit: 2017-12-31 | Discharge: 2017-12-31 | Disposition: A | Discharge status: Home / Self Care | Payer: PPO | Source: Ambulatory Visit | Attending: Oncology | Admitting: Oncology

## 2017-12-31 DIAGNOSIS — Z13820 Encounter for screening for osteoporosis: Secondary | ICD-10-CM | POA: Diagnosis not present

## 2017-12-31 DIAGNOSIS — M4854XA Collapsed vertebra, not elsewhere classified, thoracic region, initial encounter for fracture: Secondary | ICD-10-CM | POA: Insufficient documentation

## 2017-12-31 DIAGNOSIS — S22000A Wedge compression fracture of unspecified thoracic vertebra, initial encounter for closed fracture: Secondary | ICD-10-CM

## 2018-01-03 DIAGNOSIS — E2749 Other adrenocortical insufficiency: Secondary | ICD-10-CM | POA: Diagnosis not present

## 2018-01-03 DIAGNOSIS — E039 Hypothyroidism, unspecified: Secondary | ICD-10-CM | POA: Diagnosis not present

## 2018-01-08 ENCOUNTER — Other Ambulatory Visit: Payer: Self-pay

## 2018-01-08 ENCOUNTER — Inpatient Hospital Stay: Payer: PPO | Attending: Oncology

## 2018-01-08 ENCOUNTER — Inpatient Hospital Stay (HOSPITAL_BASED_OUTPATIENT_CLINIC_OR_DEPARTMENT_OTHER): Payer: PPO | Admitting: Oncology

## 2018-01-08 ENCOUNTER — Encounter: Payer: Self-pay | Admitting: Oncology

## 2018-01-08 ENCOUNTER — Inpatient Hospital Stay: Payer: PPO

## 2018-01-08 VITALS — BP 129/82 | HR 67 | Temp 97.1°F | Resp 18 | Wt 215.0 lb

## 2018-01-08 DIAGNOSIS — S22000S Wedge compression fracture of unspecified thoracic vertebra, sequela: Secondary | ICD-10-CM | POA: Diagnosis not present

## 2018-01-08 DIAGNOSIS — N400 Enlarged prostate without lower urinary tract symptoms: Secondary | ICD-10-CM | POA: Insufficient documentation

## 2018-01-08 DIAGNOSIS — H10403 Unspecified chronic conjunctivitis, bilateral: Secondary | ICD-10-CM

## 2018-01-08 DIAGNOSIS — Z79899 Other long term (current) drug therapy: Secondary | ICD-10-CM | POA: Insufficient documentation

## 2018-01-08 DIAGNOSIS — M1990 Unspecified osteoarthritis, unspecified site: Secondary | ICD-10-CM | POA: Insufficient documentation

## 2018-01-08 DIAGNOSIS — T50905A Adverse effect of unspecified drugs, medicaments and biological substances, initial encounter: Secondary | ICD-10-CM

## 2018-01-08 DIAGNOSIS — Z9221 Personal history of antineoplastic chemotherapy: Secondary | ICD-10-CM | POA: Diagnosis not present

## 2018-01-08 DIAGNOSIS — H5789 Other specified disorders of eye and adnexa: Secondary | ICD-10-CM | POA: Diagnosis not present

## 2018-01-08 DIAGNOSIS — R0602 Shortness of breath: Secondary | ICD-10-CM | POA: Diagnosis not present

## 2018-01-08 DIAGNOSIS — Z923 Personal history of irradiation: Secondary | ICD-10-CM | POA: Insufficient documentation

## 2018-01-08 DIAGNOSIS — R5383 Other fatigue: Secondary | ICD-10-CM | POA: Diagnosis not present

## 2018-01-08 DIAGNOSIS — X58XXXS Exposure to other specified factors, sequela: Secondary | ICD-10-CM | POA: Diagnosis not present

## 2018-01-08 DIAGNOSIS — R635 Abnormal weight gain: Secondary | ICD-10-CM

## 2018-01-08 DIAGNOSIS — E032 Hypothyroidism due to medicaments and other exogenous substances: Secondary | ICD-10-CM | POA: Diagnosis not present

## 2018-01-08 DIAGNOSIS — C770 Secondary and unspecified malignant neoplasm of lymph nodes of head, face and neck: Secondary | ICD-10-CM

## 2018-01-08 DIAGNOSIS — Z86010 Personal history of colonic polyps: Secondary | ICD-10-CM | POA: Diagnosis not present

## 2018-01-08 DIAGNOSIS — Z87891 Personal history of nicotine dependence: Secondary | ICD-10-CM

## 2018-01-08 DIAGNOSIS — Z5112 Encounter for antineoplastic immunotherapy: Secondary | ICD-10-CM | POA: Diagnosis not present

## 2018-01-08 DIAGNOSIS — F418 Other specified anxiety disorders: Secondary | ICD-10-CM | POA: Diagnosis not present

## 2018-01-08 DIAGNOSIS — Z7952 Long term (current) use of systemic steroids: Secondary | ICD-10-CM | POA: Insufficient documentation

## 2018-01-08 DIAGNOSIS — C3412 Malignant neoplasm of upper lobe, left bronchus or lung: Secondary | ICD-10-CM

## 2018-01-08 DIAGNOSIS — I10 Essential (primary) hypertension: Secondary | ICD-10-CM | POA: Diagnosis not present

## 2018-01-08 DIAGNOSIS — Z7982 Long term (current) use of aspirin: Secondary | ICD-10-CM

## 2018-01-08 DIAGNOSIS — C3492 Malignant neoplasm of unspecified part of left bronchus or lung: Secondary | ICD-10-CM

## 2018-01-08 DIAGNOSIS — E2740 Unspecified adrenocortical insufficiency: Secondary | ICD-10-CM | POA: Diagnosis not present

## 2018-01-08 DIAGNOSIS — S22000A Wedge compression fracture of unspecified thoracic vertebra, initial encounter for closed fracture: Secondary | ICD-10-CM

## 2018-01-08 LAB — COMPREHENSIVE METABOLIC PANEL
ALBUMIN: 3.6 g/dL (ref 3.5–5.0)
ALK PHOS: 49 U/L (ref 38–126)
ALT: 31 U/L (ref 0–44)
ANION GAP: 8 (ref 5–15)
AST: 22 U/L (ref 15–41)
BUN: 42 mg/dL — ABNORMAL HIGH (ref 8–23)
CALCIUM: 9.1 mg/dL (ref 8.9–10.3)
CHLORIDE: 101 mmol/L (ref 98–111)
CO2: 26 mmol/L (ref 22–32)
Creatinine, Ser: 1.32 mg/dL — ABNORMAL HIGH (ref 0.61–1.24)
GFR calc Af Amer: >60 mL/min (ref >60)
GFR calc non Af Amer: 53 mL/min — ABNORMAL LOW (ref >60)
GLUCOSE: 120 mg/dL — AB (ref 70–99)
Potassium: 4.7 mmol/L (ref 3.5–5.1)
SODIUM: 135 mmol/L (ref 135–145)
Total Bilirubin: 0.5 mg/dL (ref 0.3–1.2)
Total Protein: 6.6 g/dL (ref 6.5–8.1)

## 2018-01-08 LAB — CBC WITH DIFFERENTIAL/PLATELET
BASOS ABS: 0 10*3/uL (ref 0–0.1)
BASOS PCT: 0 %
EOS ABS: 0 10*3/uL (ref 0–0.7)
Eosinophils Relative: 0 %
HCT: 39.7 % — ABNORMAL LOW (ref 40.0–52.0)
HEMOGLOBIN: 13.7 g/dL (ref 13.0–18.0)
Lymphocytes Relative: 12 %
Lymphs Abs: 0.8 10*3/uL — ABNORMAL LOW (ref 1.0–3.6)
MCH: 33 pg (ref 26.0–34.0)
MCHC: 34.5 g/dL (ref 32.0–36.0)
MCV: 95.6 fL (ref 80.0–100.0)
MONO ABS: 0.7 10*3/uL (ref 0.2–1.0)
Monocytes Relative: 10 %
NEUTROS ABS: 5.6 10*3/uL (ref 1.4–6.5)
NEUTROS PCT: 78 %
Platelets: 158 10*3/uL (ref 150–440)
RBC: 4.16 MIL/uL — ABNORMAL LOW (ref 4.40–5.90)
RDW: 16.3 % — AB (ref 11.5–14.5)
WBC: 7.2 10*3/uL (ref 3.8–10.6)

## 2018-01-08 LAB — TSH: TSH: 13.345 u[IU]/mL — AB (ref 0.350–4.500)

## 2018-01-08 MED ORDER — HEPARIN SOD (PORK) LOCK FLUSH 100 UNIT/ML IV SOLN
500.0000 [IU] | Freq: Once | INTRAVENOUS | Status: AC
  Administered 2018-01-08: 500 [IU] via INTRAVENOUS

## 2018-01-08 MED ORDER — SODIUM CHLORIDE 0.9 % IV SOLN
Freq: Once | INTRAVENOUS | Status: AC
  Administered 2018-01-08: 11:00:00 via INTRAVENOUS
  Filled 2018-01-08: qty 1000

## 2018-01-08 MED ORDER — SODIUM CHLORIDE 0.9 % IV SOLN
1000.0000 mg | Freq: Once | INTRAVENOUS | Status: AC
  Administered 2018-01-08: 1000 mg via INTRAVENOUS
  Filled 2018-01-08: qty 20

## 2018-01-08 MED ORDER — HEPARIN SOD (PORK) LOCK FLUSH 100 UNIT/ML IV SOLN
INTRAVENOUS | Status: AC
  Filled 2018-01-08: qty 5

## 2018-01-08 MED ORDER — SODIUM CHLORIDE 0.9 % IV SOLN
10.0000 mg/kg | Freq: Once | INTRAVENOUS | Status: DC
  Filled 2018-01-08: qty 17.2

## 2018-01-08 NOTE — Progress Notes (Signed)
Hematology/Oncology Follow Up Note [facility] [phone] [phone]   Patient Care Team: [name], MD as PCP - General (Family Medicine) [name], [name], MD (General Surgery) [name], MD as Consulting Physician (Oncology) [name], RN as Registered Nurse  Reason for Visit:  Follow up for lung cancer, immunotherapy side effects, adrenal insufficiency.  HISTORY OF PRESENTING ILLNESS:  Marc Gray 72 y.o.  male who presents for treatment of Stage IIIB (cT2b, cN3, cM0)  Adenocarcinoma of lung.  # Patient is s/p [place] and taxol concurrent RT for treatment of Stage IIIB Lung cancer. He recieved additional lung boost RT Currently on Durvalumab maintenance.   # Colon CA was listed in his history, however reviewing his surgical pathology from colonoscopy on 04/15/2014, during which he had polyps removed and all are negative for cancer.patient is scheduled for colonoscopy screening this year.  # 5/1/ 2019 CT scan was discussed at tumor board and changes consistent with radiation changes  INTERVAL HISTORY Patient he presents for follow-up of for assessment prior to immune therapy maintenance for treatment of stage IIIb adenocarcinoma of lung.  #Adrenal insufficiency, follows up with endocrinology.   Currently patient is on prednisone 10 mg twice daily.  Blood pressure stable.  Fatigue has significantly improved.  #Acquired hypothyroidism,   Following up with endocrinology.  On Synthroid #Shortness of breath, chronic.  Stable. #Eye irritation, improved. #Weight gain, continue to have weight gain.  Gained 3 pounds since 2 weeks ago.  Appetite is extremely good.   Review of Systems  Constitutional: Negative for appetite change, chills, diaphoresis, fatigue, fever and unexpected weight change.  HENT:   Negative for hearing loss, lump/mass, mouth sores, nosebleeds and sore throat.   Eyes: Negative for eye problems and  icterus.       Eye irritation.  Respiratory: Positive for shortness of breath. Negative for chest tightness, cough, hemoptysis and wheezing.        Chronic SOB with exertion.   Cardiovascular: Negative for chest pain, leg swelling and palpitations.  Gastrointestinal: Negative for abdominal distention, abdominal pain, blood in stool, diarrhea, nausea, rectal pain and vomiting.  Endocrine: Negative for hot flashes.  Genitourinary: Negative for bladder incontinence, difficulty urinating, dysuria, frequency, hematuria and nocturia.   Musculoskeletal: Negative for arthralgias, back pain, flank pain, gait problem and myalgias.  Skin: Negative for itching and rash.  Neurological: Negative for dizziness, gait problem, headaches, light-headedness, numbness and seizures.  Hematological: Negative for adenopathy. Does not bruise/bleed easily.  Psychiatric/Behavioral: Negative for confusion, decreased concentration and depression. The patient is not nervous/anxious.     MEDICAL HISTORY:  Past Medical History:  Diagnosis Date  . Anxiety   . Arthritis    SHOULDER  . Cataract   . Colon cancer ([place]) 2015   Pt states during his colonoscopy he had cancerous polyps removed.   . Depression    SINCE DIAGNOSIS  . Dyspnea    DOE  . Enlarged prostate   . Hypertension   . Lung cancer [facility])    Aug 2018  . Pain    DUE TO LUNG ISSUE    SURGICAL HISTORY: Past Surgical History:  Procedure Laterality Date  . CATARACT EXTRACTION W/ INTRAOCULAR LENS  IMPLANT, BILATERAL    . COLONOSCOPY    . EYE SURGERY    . LYMPH GLAND EXCISION N/A 02/09/2017   Procedure: CERVICAL LYMPH NODE BIOPSY;  Surgeon: Robert Bellow, MD;  Location: [facility] ORS;  Service: General;  Laterality: N/A;  .  PORTACATH PLACEMENT Right 02/23/2017   Procedure: INSERTION PORT-A-CATH;  Surgeon: Robert Bellow, MD;  Location: [facility] ORS;  Service: General;  Laterality: Right;  . PROSTATE SURGERY    . TONSILLECTOMY      SOCIAL  HISTORY: Social History   Socioeconomic History  . Marital status: Married    Spouse name: Not on file  . Number of children: Not on file  . Years of education: Not on file  . Highest education level: Not on file  Occupational History  . Not on file  Social Needs  . Financial resource strain: Not on file  . Food insecurity:    Worry: Not on file    Inability: Not on file  . Transportation needs:    Medical: Not on file    Non-medical: Not on file  Tobacco Use  . Smoking status: Former Smoker    Packs/day: 1.00    Years: 53.00    Pack years: 53.00    Types: Cigarettes    Last attempt to quit: 04/14/2017    Years since quitting: 0.7  . Smokeless tobacco: Never Used  . Tobacco comment: Quit November 2018  Substance and Sexual Activity  . Alcohol use: Yes    Comment: occassional  . Drug use: No  . Sexual activity: Yes  Lifestyle  . Physical activity:    Days per week: Not on file    Minutes per session: Not on file  . Stress: Not on file  Relationships  . Social connections:    Talks on phone: Not on file    Gets together: Not on file    Attends religious service: Not on file    Active member of club or organization: Not on file    Attends meetings of clubs or organizations: Not on file    Relationship status: Not on file  . Intimate partner violence:    Fear of current or ex partner: Not on file    Emotionally abused: Not on file    Physically abused: Not on file    Forced sexual activity: Not on file  Other Topics Concern  . Not on file  Social History Narrative  . Not on file    FAMILY HISTORY: Family History  Problem Relation Age of Onset  . Breast cancer Maternal Grandmother   . Dementia Mother   . Diabetes Father   . Stroke Father     ALLERGIES:  has No Known Allergies.  MEDICATIONS:  Current Outpatient Medications  Medication Sig Dispense Refill  . aspirin 325 MG tablet Take 650 mg by mouth daily.     . budesonide-formoterol (SYMBICORT)  160-4.5 MCG/ACT inhaler Inhale into the lungs. Inhale 2 inhalations into the lungs 2 times daily    . calcium-vitamin D (OSCAL WITH D) 500-200 MG-UNIT tablet Take 1 tablet by mouth daily. 90 tablet 1  . Chlorpheniramine-Phenylephrine (SINUS & ALLERGY) 4-10 MG tablet Take 1 tablet by mouth daily as needed.     . diphenhydrAMINE-zinc acetate (BENADRYL EXTRA STRENGTH) cream Apply 1 application topically 3 (three) times daily as needed for itching. 28 g 0  . fluticasone (FLONASE) 50 MCG/ACT nasal spray Place 1 spray into both nostrils daily. 16 g 2  . levothyroxine (SYNTHROID) 137 MCG tablet Take 1 tablet (137 mcg total) by mouth daily before breakfast. 30 tablet 1  . lidocaine-prilocaine (EMLA) cream Apply over port 1-2 hours prior to chemotherapy treatment.  Cover with plastic wrap. 30 g 1  . Melatonin 10 MG TABS Take  10 mg by mouth at bedtime as needed.    . Multiple Vitamin (MULTI-VITAMINS) TABS Take 1 tablet by mouth daily.     . ondansetron (ZOFRAN) 8 MG tablet Take 1 tablet (8 mg total) by mouth every 8 (eight) hours as needed for nausea or vomiting. 60 tablet 1  . pantoprazole (PROTONIX) 40 MG tablet Take 1 tablet (40 mg total) by mouth daily. 30 tablet 2  . predniSONE (DELTASONE) 20 MG tablet Take 1 tablet (20 mg total) by mouth daily with breakfast. 7 tablet 0  . PROAIR HFA 108 (90 Base) MCG/ACT inhaler INHALE 2 PUFFS BY MOUTH EVERY 6 HOURS AS NEEDED 8.5 g 0  . prochlorperazine (COMPAZINE) 10 MG tablet Take 1 tablet (10 mg total) by mouth every 6 (six) hours as needed for nausea or vomiting. 60 tablet 2  . tiotropium (SPIRIVA HANDIHALER) 18 MCG inhalation capsule Place 1 capsule (18 mcg total) into inhaler and inhale daily. 30 capsule 1  . triamcinolone ointment (KENALOG) 0.5 % Apply 1 application topically 3 (three) times daily as needed. 30 g 0  . fludrocortisone (FLORINEF) 0.1 MG tablet Take 1 tablet (0.1 mg total) by mouth daily. (Patient not taking: Reported on 12/25/2017) 30 tablet 0  .  predniSONE (DELTASONE) 50 MG tablet Take 1 tab daily. (Patient not taking: Reported on 12/25/2017) 20 tablet 0   No current facility-administered medications for this visit.    Facility-Administered Medications Ordered in Other Visits  Medication Dose Route Frequency Provider Last Rate Last Dose  . sodium chloride flush (NS) 0.9 % injection 10 mL  10 mL Intravenous PRN [name], MD   10 mL at 04/03/17 9024      .  PHYSICAL EXAMINATION: ECOG PERFORMANCE STATUS: 1 - Symptomatic but completely ambulatory Vitals:   01/08/18 0909 01/08/18 0918  BP: 129/82   Pulse: 67   Resp: 18   Temp: (!) 97.1 F (36.2 C)   SpO2:  94%   Filed Weights   01/08/18 0909  Weight: 215 lb (97.5 kg)  Physical Exam  Constitutional: He is oriented to person, place, and time and well-developed, well-nourished, and in no distress. No distress.  HENT:  Head: Normocephalic and atraumatic.  Right Ear: External ear normal.  Left Ear: External ear normal.  Nose: Nose normal.  Mouth/Throat: Oropharynx is clear and moist. No oropharyngeal exudate.  Eyes: Pupils are equal, round, and reactive to light. EOM are normal. Right eye exhibits no discharge. Left eye exhibits no discharge. No scleral icterus.  Neck: Normal range of motion. Neck supple. No JVD present.  Cardiovascular: Normal rate, regular rhythm and normal heart sounds.  No murmur heard. Pulmonary/Chest: Effort normal and breath sounds normal. No stridor. No respiratory distress. He has no wheezes. He has no rales. He exhibits no tenderness.  Decreased sound bilaterally lower lobe.   Abdominal: Soft. Bowel sounds are normal. He exhibits no distension and no mass. There is no tenderness. There is no rebound.  Musculoskeletal: Normal range of motion. He exhibits no edema or tenderness.  Lymphadenopathy:    He has no cervical adenopathy.  Neurological: He is alert and oriented to person, place, and time. No cranial nerve deficit. He exhibits normal muscle  tone. Coordination normal.  Skin: Skin is warm and dry. No rash noted. He is not diaphoretic. No erythema.  Posterior right shoulder erythematous rash.   Psychiatric: Memory, affect and judgment normal.   LABORATORY DATA:  I have reviewed the data as listed Lab Results  Component  Value Date   WBC 7.2 01/08/2018   HGB 13.7 01/08/2018   HCT 39.7 (L) 01/08/2018   MCV 95.6 01/08/2018   PLT 158 01/08/2018   Recent Labs    12/05/17 1341 12/25/17 0927 01/08/18 0856  NA 133* 135 135  K 4.7 4.8 4.7  CL 101 102 101  CO2 22 26 26   GLUCOSE 126* 140* 120*  BUN 32* 37* 42*  CREATININE 1.24 1.26* 1.32*  CALCIUM 8.8* 8.8* 9.1  GFRNONAA 57* 56* 53*  GFRAA >60 >60 >60  PROT 6.5 6.5 6.6  ALBUMIN 3.6 3.5 3.6  AST 21 26 22   ALT 24 39 31  ALKPHOS 52 44 49  BILITOT 0.6 0.4 0.5   RADIOGRAPHIC STUDIES: I have personally reviewed the radiological images as listed and agreed with the findings in the report. 7/22/19CAT scan was independently reviewed and discussed with patient.  Image showed a stable masslike consolidation in the left upper lobe with evolving radiation therapy changes in the medial left hemithorax.  Subpleural right upper lobe nodule is unchanged.  Small /pericardial effusion, slightly increased from prior. Patient had a new T2 superior endplate compression fracture. Aortic atherosclerosis, three-vessel coronary artery calcification.  Ascending aortic aneurysm.  Emphysema.  ASSESSMENT & PLAN:  Cancer Staging Adenocarcinoma of left lung, stage 3 (HCC) Staging form: Lung, AJCC 8th Edition - Clinical stage from 02/14/2017: Stage IIIB (cT2b, cN3, cM0) - Signed by [name], MD on 02/14/2017  1. Encounter for antineoplastic immunotherapy   2. Hypothyroidism due to medication   3. Adenocarcinoma of left lung, stage 3 ([place])   4. Compression fracture of body of thoracic vertebra (HCC)   5. Adrenal insufficiency ([place])   6. Chronic conjunctivitis of both eyes, unspecified chronic  conjunctivitis type   7. Weight gain due to medication    #Stage IIIb lung adenocarcinoma,  Labs reviewed and discussed with patient.  Acceptable to proceed with today's durvalumab. Stable disease.  Counts are reviewed and vital signs stable.  Resume immunotherapy with durvalumab.  See below  #Adrenal insufficiency, follow-up with Dr.Solum.  Continue prednisone.  Blood pressure stable and fatigue has resolved. #Hypothyroidism, continue Synthroid 137 MCG daily.  TSH was reviewed.  Continue to be elevated.  Will fax results to Dr. Joycie Peek office.  #Weight gain, likely secondary to glucocorticoid use.  Discussed patient about lifestyle modification, healthy diet and exercise.   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  Bone density scan was independently reviewed and reveals normal bone density.  Recommend patient to continue taking calcium and vitamin D supplementation given that he is going to be on prednisone long-term.   Orders Placed This Encounter  Procedures  . TSH    Standing Status:   Standing    Number of Occurrences:   20    Standing Expiration Date:   01/09/2019  Total face to face encounter time for this patient visit was 25 min  >50% of the time was  spent in counseling and coordination of care.  All questions were answered. The patient knows to call the clinic with any problems questions or concerns.  [name], MD, PhD Hematology Oncology [facility] Pager- [phone] 01/08/2018

## 2018-01-08 NOTE — Progress Notes (Signed)
Patient here for follow up. No concerns voiced.  °

## 2018-01-08 NOTE — Progress Notes (Signed)
Increase in weight >10%.  Ok to change dose to reflect per MD

## 2018-01-09 DIAGNOSIS — E2749 Other adrenocortical insufficiency: Secondary | ICD-10-CM | POA: Diagnosis not present

## 2018-01-09 DIAGNOSIS — E039 Hypothyroidism, unspecified: Secondary | ICD-10-CM | POA: Diagnosis not present

## 2018-01-09 DIAGNOSIS — C3492 Malignant neoplasm of unspecified part of left bronchus or lung: Secondary | ICD-10-CM | POA: Diagnosis not present

## 2018-01-22 ENCOUNTER — Inpatient Hospital Stay: Payer: PPO

## 2018-01-22 ENCOUNTER — Encounter: Payer: Self-pay | Admitting: Oncology

## 2018-01-22 ENCOUNTER — Other Ambulatory Visit: Payer: Self-pay

## 2018-01-22 ENCOUNTER — Inpatient Hospital Stay (HOSPITAL_BASED_OUTPATIENT_CLINIC_OR_DEPARTMENT_OTHER): Payer: PPO | Admitting: Oncology

## 2018-01-22 VITALS — BP 119/78 | HR 69 | Temp 96.4°F | Resp 18 | Wt 218.1 lb

## 2018-01-22 DIAGNOSIS — Z9221 Personal history of antineoplastic chemotherapy: Secondary | ICD-10-CM

## 2018-01-22 DIAGNOSIS — C770 Secondary and unspecified malignant neoplasm of lymph nodes of head, face and neck: Secondary | ICD-10-CM

## 2018-01-22 DIAGNOSIS — R635 Abnormal weight gain: Secondary | ICD-10-CM | POA: Diagnosis not present

## 2018-01-22 DIAGNOSIS — Z86010 Personal history of colonic polyps: Secondary | ICD-10-CM

## 2018-01-22 DIAGNOSIS — M1990 Unspecified osteoarthritis, unspecified site: Secondary | ICD-10-CM

## 2018-01-22 DIAGNOSIS — R5383 Other fatigue: Secondary | ICD-10-CM | POA: Diagnosis not present

## 2018-01-22 DIAGNOSIS — R0602 Shortness of breath: Secondary | ICD-10-CM | POA: Diagnosis not present

## 2018-01-22 DIAGNOSIS — E032 Hypothyroidism due to medicaments and other exogenous substances: Secondary | ICD-10-CM

## 2018-01-22 DIAGNOSIS — Z5112 Encounter for antineoplastic immunotherapy: Secondary | ICD-10-CM

## 2018-01-22 DIAGNOSIS — Z79899 Other long term (current) drug therapy: Secondary | ICD-10-CM

## 2018-01-22 DIAGNOSIS — S22000S Wedge compression fracture of unspecified thoracic vertebra, sequela: Secondary | ICD-10-CM | POA: Diagnosis not present

## 2018-01-22 DIAGNOSIS — X58XXXS Exposure to other specified factors, sequela: Secondary | ICD-10-CM | POA: Diagnosis not present

## 2018-01-22 DIAGNOSIS — N400 Enlarged prostate without lower urinary tract symptoms: Secondary | ICD-10-CM

## 2018-01-22 DIAGNOSIS — Z923 Personal history of irradiation: Secondary | ICD-10-CM

## 2018-01-22 DIAGNOSIS — Z7952 Long term (current) use of systemic steroids: Secondary | ICD-10-CM

## 2018-01-22 DIAGNOSIS — E2740 Unspecified adrenocortical insufficiency: Secondary | ICD-10-CM | POA: Diagnosis not present

## 2018-01-22 DIAGNOSIS — C3492 Malignant neoplasm of unspecified part of left bronchus or lung: Secondary | ICD-10-CM

## 2018-01-22 DIAGNOSIS — F418 Other specified anxiety disorders: Secondary | ICD-10-CM

## 2018-01-22 DIAGNOSIS — H5789 Other specified disorders of eye and adnexa: Secondary | ICD-10-CM

## 2018-01-22 DIAGNOSIS — T50905A Adverse effect of unspecified drugs, medicaments and biological substances, initial encounter: Secondary | ICD-10-CM

## 2018-01-22 DIAGNOSIS — Z87891 Personal history of nicotine dependence: Secondary | ICD-10-CM

## 2018-01-22 DIAGNOSIS — C3412 Malignant neoplasm of upper lobe, left bronchus or lung: Secondary | ICD-10-CM

## 2018-01-22 DIAGNOSIS — I10 Essential (primary) hypertension: Secondary | ICD-10-CM

## 2018-01-22 DIAGNOSIS — Z7982 Long term (current) use of aspirin: Secondary | ICD-10-CM

## 2018-01-22 LAB — COMPREHENSIVE METABOLIC PANEL
ALK PHOS: 50 U/L (ref 38–126)
ALT: 22 U/L (ref 0–44)
AST: 21 U/L (ref 15–41)
Albumin: 3.4 g/dL — ABNORMAL LOW (ref 3.5–5.0)
Anion gap: 8 (ref 5–15)
BUN: 32 mg/dL — AB (ref 8–23)
CALCIUM: 8.7 mg/dL — AB (ref 8.9–10.3)
CO2: 27 mmol/L (ref 22–32)
CREATININE: 1.32 mg/dL — AB (ref 0.61–1.24)
Chloride: 103 mmol/L (ref 98–111)
GFR calc non Af Amer: 53 mL/min — ABNORMAL LOW (ref >60)
Glucose, Bld: 108 mg/dL — ABNORMAL HIGH (ref 70–99)
Potassium: 4 mmol/L (ref 3.5–5.1)
SODIUM: 138 mmol/L (ref 135–145)
Total Bilirubin: 0.6 mg/dL (ref 0.3–1.2)
Total Protein: 6.2 g/dL — ABNORMAL LOW (ref 6.5–8.1)

## 2018-01-22 LAB — CBC WITH DIFFERENTIAL/PLATELET
BASOS PCT: 1 %
Basophils Absolute: 0 10*3/uL (ref 0–0.1)
EOS ABS: 0.1 10*3/uL (ref 0–0.7)
EOS PCT: 1 %
HCT: 38.2 % — ABNORMAL LOW (ref 40.0–52.0)
HEMOGLOBIN: 13 g/dL (ref 13.0–18.0)
LYMPHS ABS: 1 10*3/uL (ref 1.0–3.6)
Lymphocytes Relative: 15 %
MCH: 32.5 pg (ref 26.0–34.0)
MCHC: 34.2 g/dL (ref 32.0–36.0)
MCV: 95 fL (ref 80.0–100.0)
MONO ABS: 0.8 10*3/uL (ref 0.2–1.0)
MONOS PCT: 11 %
NEUTROS PCT: 72 %
Neutro Abs: 4.8 10*3/uL (ref 1.4–6.5)
PLATELETS: 203 10*3/uL (ref 150–440)
RBC: 4.02 MIL/uL — ABNORMAL LOW (ref 4.40–5.90)
RDW: 15.7 % — AB (ref 11.5–14.5)
WBC: 6.6 10*3/uL (ref 3.8–10.6)

## 2018-01-22 MED ORDER — SODIUM CHLORIDE 0.9% FLUSH
10.0000 mL | INTRAVENOUS | Status: DC | PRN
  Administered 2018-01-22: 10 mL via INTRAVENOUS
  Filled 2018-01-22: qty 10

## 2018-01-22 MED ORDER — HEPARIN SOD (PORK) LOCK FLUSH 100 UNIT/ML IV SOLN
500.0000 [IU] | Freq: Once | INTRAVENOUS | Status: AC
  Administered 2018-01-22: 500 [IU] via INTRAVENOUS
  Filled 2018-01-22: qty 5

## 2018-01-22 MED ORDER — SODIUM CHLORIDE 0.9 % IV SOLN
Freq: Once | INTRAVENOUS | Status: AC
  Administered 2018-01-22: 10:00:00 via INTRAVENOUS
  Filled 2018-01-22: qty 250

## 2018-01-22 MED ORDER — SODIUM CHLORIDE 0.9 % IV SOLN
1000.0000 mg | Freq: Once | INTRAVENOUS | Status: AC
  Administered 2018-01-22: 1000 mg via INTRAVENOUS
  Filled 2018-01-22: qty 20

## 2018-01-22 NOTE — Progress Notes (Signed)
Patient here for follow up. He says he still has cough and sometimes coughs up yellow thick mucus.

## 2018-01-22 NOTE — Progress Notes (Signed)
Hematology/Oncology Follow Up Note [facility] [phone] [phone]   Patient Care Team: [name], MD as PCP - General (Family Medicine) [name], [name], MD (General Surgery) [name], MD as Consulting Physician (Oncology) [name], RN as Registered Nurse  Reason for Visit:  Follow up for lung cancer, immunotherapy side effects, adrenal insufficiency.  HISTORY OF PRESENTING ILLNESS:  Marc Gray 72 y.o.  male who presents for treatment of Stage IIIB (cT2b, cN3, cM0)  Adenocarcinoma of lung.  # Patient is s/p [place] and taxol concurrent RT for treatment of Stage IIIB Lung cancer. He recieved additional lung boost RT Currently on Durvalumab maintenance.   # Colon CA was listed in his history, however reviewing his surgical pathology from colonoscopy on 04/15/2014, during which he had polyps removed and all are negative for cancer.patient is scheduled for colonoscopy screening this year.  # 5/1/ 2019 CT scan was discussed at tumor board and changes consistent with radiation changes  INTERVAL HISTORY Patient he presents for follow-up of for assessment prior to immune therapy maintenance immunotherapy for stage IIIb adenocarcinoma of lung. maintenance for treatment of stage IIIb adenocarcinoma of lung.  #Adrenal insufficiency, follows up with endocrinology.  He takes 10 mg in the morning and 5 mg in the evening.  Blood pressure has been stable.  Fatigue has significantly improved.  #Acquired hypothyroidism,   Following up with endocrinology.  On Synthroid #Shortness of breath, chronic stable. #Eye irritation, improved #Weight gain, appetite is very good..  Gained another 3 pounds during the 2 weeks interval.   Review of Systems  Constitutional: Negative for appetite change, chills, diaphoresis, fatigue, fever and unexpected weight change.  HENT:   Negative for hearing loss, lump/mass, mouth sores, nosebleeds  and sore throat.   Eyes: Negative for eye problems and icterus.       Eye irritation.  Respiratory: Positive for shortness of breath. Negative for chest tightness, cough, hemoptysis and wheezing.        Chronic SOB with exertion.   Cardiovascular: Negative for chest pain, leg swelling and palpitations.  Gastrointestinal: Negative for abdominal distention, abdominal pain, blood in stool, diarrhea, nausea, rectal pain and vomiting.  Endocrine: Negative for hot flashes.  Genitourinary: Negative for bladder incontinence, difficulty urinating, dysuria, frequency, hematuria and nocturia.   Musculoskeletal: Negative for arthralgias, back pain, flank pain, gait problem and myalgias.  Skin: Negative for itching and rash.  Neurological: Negative for dizziness, gait problem, headaches, light-headedness, numbness and seizures.  Hematological: Negative for adenopathy. Does not bruise/bleed easily.  Psychiatric/Behavioral: Negative for confusion, decreased concentration and depression. The patient is not nervous/anxious.     MEDICAL HISTORY:  Past Medical History:  Diagnosis Date  . Anxiety   . Arthritis    SHOULDER  . Cataract   . Colon cancer ([place]) 2015   Pt states during his colonoscopy he had cancerous polyps removed.   . Depression    SINCE DIAGNOSIS  . Dyspnea    DOE  . Enlarged prostate   . Hypertension   . Lung cancer [facility])    Aug 2018  . Pain    DUE TO LUNG ISSUE    SURGICAL HISTORY: Past Surgical History:  Procedure Laterality Date  . CATARACT EXTRACTION W/ INTRAOCULAR LENS  IMPLANT, BILATERAL    . COLONOSCOPY    . EYE SURGERY    . LYMPH GLAND EXCISION N/A 02/09/2017   Procedure: CERVICAL LYMPH NODE BIOPSY;  Surgeon: Robert Bellow, MD;  Location: [facility]  ORS;  Service: General;  Laterality: N/A;  . PORTACATH PLACEMENT Right 02/23/2017   Procedure: INSERTION PORT-A-CATH;  Surgeon: Robert Bellow, MD;  Location: [facility] ORS;  Service: General;  Laterality: Right;  . PROSTATE  SURGERY    . TONSILLECTOMY      SOCIAL HISTORY: Social History   Socioeconomic History  . Marital status: Married    Spouse name: Not on file  . Number of children: Not on file  . Years of education: Not on file  . Highest education level: Not on file  Occupational History  . Not on file  Social Needs  . Financial resource strain: Not on file  . Food insecurity:    Worry: Not on file    Inability: Not on file  . Transportation needs:    Medical: Not on file    Non-medical: Not on file  Tobacco Use  . Smoking status: Former Smoker    Packs/day: 1.00    Years: 53.00    Pack years: 53.00    Types: Cigarettes    Last attempt to quit: 04/14/2017    Years since quitting: 0.7  . Smokeless tobacco: Never Used  . Tobacco comment: Quit November 2018  Substance and Sexual Activity  . Alcohol use: Yes    Comment: occassional  . Drug use: No  . Sexual activity: Yes  Lifestyle  . Physical activity:    Days per week: Not on file    Minutes per session: Not on file  . Stress: Not on file  Relationships  . Social connections:    Talks on phone: Not on file    Gets together: Not on file    Attends religious service: Not on file    Active member of club or organization: Not on file    Attends meetings of clubs or organizations: Not on file    Relationship status: Not on file  . Intimate partner violence:    Fear of current or ex partner: Not on file    Emotionally abused: Not on file    Physically abused: Not on file    Forced sexual activity: Not on file  Other Topics Concern  . Not on file  Social History Narrative  . Not on file    FAMILY HISTORY: Family History  Problem Relation Age of Onset  . Breast cancer Maternal Grandmother   . Dementia Mother   . Diabetes Father   . Stroke Father     ALLERGIES:  has No Known Allergies.  MEDICATIONS:  Current Outpatient Medications  Medication Sig Dispense Refill  . aspirin 325 MG tablet Take 650 mg by mouth daily.      . budesonide-formoterol (SYMBICORT) 160-4.5 MCG/ACT inhaler Inhale into the lungs. Inhale 2 inhalations into the lungs 2 times daily    . calcium-vitamin D (OSCAL WITH D) 500-200 MG-UNIT tablet Take 1 tablet by mouth daily. 90 tablet 1  . Chlorpheniramine-Phenylephrine (SINUS & ALLERGY) 4-10 MG tablet Take 1 tablet by mouth daily as needed.     . diphenhydrAMINE-zinc acetate (BENADRYL EXTRA STRENGTH) cream Apply 1 application topically 3 (three) times daily as needed for itching. 28 g 0  . fluticasone (FLONASE) 50 MCG/ACT nasal spray Place 1 spray into both nostrils daily. 16 g 2  . levothyroxine (SYNTHROID, LEVOTHROID) 150 MCG tablet Take by mouth. Take 1 tablet (150 mcg total) by mouth once daily Take on an empty stomach with a glass of water at least 30-60 minutes before breakfast.    .  lidocaine-prilocaine (EMLA) cream Apply over port 1-2 hours prior to chemotherapy treatment.  Cover with plastic wrap. 30 g 1  . Melatonin 10 MG TABS Take 10 mg by mouth at bedtime as needed.    . Multiple Vitamin (MULTI-VITAMINS) TABS Take 1 tablet by mouth daily.     . ondansetron (ZOFRAN) 8 MG tablet Take 1 tablet (8 mg total) by mouth every 8 (eight) hours as needed for nausea or vomiting. 60 tablet 1  . pantoprazole (PROTONIX) 40 MG tablet Take 1 tablet (40 mg total) by mouth daily. 30 tablet 2  . predniSONE (DELTASONE) 10 MG tablet Take one tablet each morning. Take one-half tablet each afternoon (take between 2 - 5 PM)    . PROAIR HFA 108 (90 Base) MCG/ACT inhaler INHALE 2 PUFFS BY MOUTH EVERY 6 HOURS AS NEEDED 8.5 g 0  . prochlorperazine (COMPAZINE) 10 MG tablet Take 1 tablet (10 mg total) by mouth every 6 (six) hours as needed for nausea or vomiting. 60 tablet 2  . tiotropium (SPIRIVA HANDIHALER) 18 MCG inhalation capsule Place 1 capsule (18 mcg total) into inhaler and inhale daily. 30 capsule 1  . triamcinolone ointment (KENALOG) 0.5 % Apply 1 application topically 3 (three) times daily as needed. 30 g 0    No current facility-administered medications for this visit.    Facility-Administered Medications Ordered in Other Visits  Medication Dose Route Frequency Provider Last Rate Last Dose  . sodium chloride flush (NS) 0.9 % injection 10 mL  10 mL Intravenous PRN [name], MD   10 mL at 04/03/17 2297      .  PHYSICAL EXAMINATION: ECOG PERFORMANCE STATUS: 1 - Symptomatic but completely ambulatory Vitals:   01/22/18 0916  BP: 119/78  Pulse: 69  Resp: 18  Temp: (!) 96.4 F (35.8 C)  SpO2: 96%   Filed Weights   01/22/18 0916  Weight: 218 lb 1.6 oz (98.9 kg)  Physical Exam  Constitutional: He is oriented to person, place, and time and well-developed, well-nourished, and in no distress. No distress.  HENT:  Head: Normocephalic and atraumatic.  Right Ear: External ear normal.  Left Ear: External ear normal.  Nose: Nose normal.  Mouth/Throat: Oropharynx is clear and moist. No oropharyngeal exudate.  Eyes: Pupils are equal, round, and reactive to light. EOM are normal. Right eye exhibits no discharge. Left eye exhibits no discharge. No scleral icterus.  Neck: Normal range of motion. Neck supple. No JVD present.  Cardiovascular: Normal rate, regular rhythm and normal heart sounds.  No murmur heard. Pulmonary/Chest: Effort normal and breath sounds normal. No stridor. No respiratory distress. He has no wheezes. He has no rales. He exhibits no tenderness.  Decreased sound bilaterally lower lobe.   Abdominal: Soft. Bowel sounds are normal. He exhibits no distension and no mass. There is no tenderness. There is no rebound.  Musculoskeletal: Normal range of motion. He exhibits no edema or tenderness.  Lymphadenopathy:    He has no cervical adenopathy.  Neurological: He is alert and oriented to person, place, and time. No cranial nerve deficit. He exhibits normal muscle tone. Coordination normal.  Skin: Skin is warm and dry. No rash noted. He is not diaphoretic. No erythema.  Posterior  right shoulder erythematous rash.   Psychiatric: Memory, affect and judgment normal.   LABORATORY DATA:  I have reviewed the data as listed Lab Results  Component Value Date   WBC 6.6 01/22/2018   HGB 13.0 01/22/2018   HCT 38.2 (L) 01/22/2018  MCV 95.0 01/22/2018   PLT 203 01/22/2018   Recent Labs    12/25/17 0927 01/08/18 0856 01/22/18 0855  NA 135 135 138  K 4.8 4.7 4.0  CL 102 101 103  CO2 26 26 27   GLUCOSE 140* 120* 108*  BUN 37* 42* 32*  CREATININE 1.26* 1.32* 1.32*  CALCIUM 8.8* 9.1 8.7*  GFRNONAA 56* 53* 53*  GFRAA >60 >60 >60  PROT 6.5 6.6 6.2*  ALBUMIN 3.5 3.6 3.4*  AST 26 22 21   ALT 39 31 22  ALKPHOS 44 49 50  BILITOT 0.4 0.5 0.6   RADIOGRAPHIC STUDIES: I have personally reviewed the radiological images as listed and agreed with the findings in the report. 7/22/19CAT scan was independently reviewed and discussed with patient.  Image showed a stable masslike consolidation in the left upper lobe with evolving radiation therapy changes in the medial left hemithorax.  Subpleural right upper lobe nodule is unchanged.  Small /pericardial effusion, slightly increased from prior. Patient had a new T2 superior endplate compression fracture. Aortic atherosclerosis, three-vessel coronary artery calcification.  Ascending aortic aneurysm.  Emphysema.  ASSESSMENT & PLAN:  Cancer Staging Adenocarcinoma of left lung, stage 3 (HCC) Staging form: Lung, AJCC 8th Edition - Clinical stage from 02/14/2017: Stage IIIB (cT2b, cN3, cM0) - Signed by [name], MD on 02/14/2017  1. Adenocarcinoma of left lung, stage 3 ([place])   2. Hypothyroidism due to medication   3. Adrenal insufficiency ([place])   4. Encounter for antineoplastic immunotherapy   5. Weight gain due to medication    #Stage IIIb lung adenocarcinoma,  Labs reviewed and discussed with patient.  Acceptable to proceed with today's durvalumab.  Stable disease.  Counts are reviewed and vital signs stable. #Adrenal  insufficiency, follow-up with Dr.Solum.  Continue prednisone 10 mg in the a.m. and 5 mg in the evening.  Blood pressure has been stable.   #Hypothyroidism, continue Synthroid 150 MCG daily.[name]    #Weight gain, likely secondary to glucocorticoid use.  Discussed patient about lifestyle modification, healthy diet and exercise.   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  Bone density scan was independently reviewed and reveals normal bone density.  Continue vitamin D and calcium supplements. Total face to face encounter time for this patient visit was 15 min. >50% of the time was  spent in counseling and coordination of care.   [name], MD, PhD Hematology Oncology [facility] at [facility] Pager- [phone] 01/22/2018

## 2018-01-27 ENCOUNTER — Other Ambulatory Visit: Payer: Self-pay | Admitting: Oncology

## 2018-02-04 ENCOUNTER — Other Ambulatory Visit: Payer: Self-pay | Admitting: Oncology

## 2018-02-04 DIAGNOSIS — C3492 Malignant neoplasm of unspecified part of left bronchus or lung: Secondary | ICD-10-CM

## 2018-02-05 ENCOUNTER — Encounter: Payer: Self-pay | Admitting: Oncology

## 2018-02-05 ENCOUNTER — Other Ambulatory Visit: Payer: Self-pay

## 2018-02-05 ENCOUNTER — Inpatient Hospital Stay (HOSPITAL_BASED_OUTPATIENT_CLINIC_OR_DEPARTMENT_OTHER): Payer: PPO | Admitting: Oncology

## 2018-02-05 ENCOUNTER — Inpatient Hospital Stay: Payer: PPO | Attending: Oncology

## 2018-02-05 ENCOUNTER — Inpatient Hospital Stay: Payer: PPO

## 2018-02-05 VITALS — BP 115/72 | HR 69 | Temp 96.1°F | Resp 16 | Wt 213.4 lb

## 2018-02-05 DIAGNOSIS — Z923 Personal history of irradiation: Secondary | ICD-10-CM

## 2018-02-05 DIAGNOSIS — C3492 Malignant neoplasm of unspecified part of left bronchus or lung: Secondary | ICD-10-CM

## 2018-02-05 DIAGNOSIS — E2740 Unspecified adrenocortical insufficiency: Secondary | ICD-10-CM | POA: Insufficient documentation

## 2018-02-05 DIAGNOSIS — Z9181 History of falling: Secondary | ICD-10-CM | POA: Diagnosis not present

## 2018-02-05 DIAGNOSIS — R5383 Other fatigue: Secondary | ICD-10-CM

## 2018-02-05 DIAGNOSIS — N400 Enlarged prostate without lower urinary tract symptoms: Secondary | ICD-10-CM | POA: Diagnosis not present

## 2018-02-05 DIAGNOSIS — Z5112 Encounter for antineoplastic immunotherapy: Secondary | ICD-10-CM | POA: Diagnosis not present

## 2018-02-05 DIAGNOSIS — Z7952 Long term (current) use of systemic steroids: Secondary | ICD-10-CM | POA: Diagnosis not present

## 2018-02-05 DIAGNOSIS — Z87891 Personal history of nicotine dependence: Secondary | ICD-10-CM

## 2018-02-05 DIAGNOSIS — Z9221 Personal history of antineoplastic chemotherapy: Secondary | ICD-10-CM

## 2018-02-05 DIAGNOSIS — S22000A Wedge compression fracture of unspecified thoracic vertebra, initial encounter for closed fracture: Secondary | ICD-10-CM

## 2018-02-05 DIAGNOSIS — M4854XS Collapsed vertebra, not elsewhere classified, thoracic region, sequela of fracture: Secondary | ICD-10-CM | POA: Insufficient documentation

## 2018-02-05 DIAGNOSIS — E032 Hypothyroidism due to medicaments and other exogenous substances: Secondary | ICD-10-CM

## 2018-02-05 DIAGNOSIS — Z86010 Personal history of colonic polyps: Secondary | ICD-10-CM | POA: Diagnosis not present

## 2018-02-05 DIAGNOSIS — C770 Secondary and unspecified malignant neoplasm of lymph nodes of head, face and neck: Secondary | ICD-10-CM

## 2018-02-05 DIAGNOSIS — C3412 Malignant neoplasm of upper lobe, left bronchus or lung: Secondary | ICD-10-CM | POA: Insufficient documentation

## 2018-02-05 DIAGNOSIS — R634 Abnormal weight loss: Secondary | ICD-10-CM | POA: Diagnosis not present

## 2018-02-05 DIAGNOSIS — M1990 Unspecified osteoarthritis, unspecified site: Secondary | ICD-10-CM | POA: Diagnosis not present

## 2018-02-05 DIAGNOSIS — Z79899 Other long term (current) drug therapy: Secondary | ICD-10-CM | POA: Diagnosis not present

## 2018-02-05 DIAGNOSIS — Z7982 Long term (current) use of aspirin: Secondary | ICD-10-CM

## 2018-02-05 DIAGNOSIS — R635 Abnormal weight gain: Secondary | ICD-10-CM

## 2018-02-05 DIAGNOSIS — F418 Other specified anxiety disorders: Secondary | ICD-10-CM | POA: Insufficient documentation

## 2018-02-05 DIAGNOSIS — H5789 Other specified disorders of eye and adnexa: Secondary | ICD-10-CM | POA: Diagnosis not present

## 2018-02-05 DIAGNOSIS — X58XXXS Exposure to other specified factors, sequela: Secondary | ICD-10-CM | POA: Insufficient documentation

## 2018-02-05 DIAGNOSIS — I10 Essential (primary) hypertension: Secondary | ICD-10-CM | POA: Diagnosis not present

## 2018-02-05 DIAGNOSIS — T50905A Adverse effect of unspecified drugs, medicaments and biological substances, initial encounter: Secondary | ICD-10-CM

## 2018-02-05 LAB — CBC WITH DIFFERENTIAL/PLATELET
BASOS ABS: 0.1 10*3/uL (ref 0–0.1)
BASOS PCT: 1 %
Eosinophils Absolute: 0.3 10*3/uL (ref 0–0.7)
Eosinophils Relative: 5 %
HEMATOCRIT: 37.6 % — AB (ref 40.0–52.0)
Hemoglobin: 12.6 g/dL — ABNORMAL LOW (ref 13.0–18.0)
LYMPHS PCT: 15 %
Lymphs Abs: 1 10*3/uL (ref 1.0–3.6)
MCH: 31.4 pg (ref 26.0–34.0)
MCHC: 33.5 g/dL (ref 32.0–36.0)
MCV: 93.5 fL (ref 80.0–100.0)
MONO ABS: 0.6 10*3/uL (ref 0.2–1.0)
Monocytes Relative: 9 %
NEUTROS ABS: 4.5 10*3/uL (ref 1.4–6.5)
NEUTROS PCT: 70 %
Platelets: 241 10*3/uL (ref 150–440)
RBC: 4.03 MIL/uL — AB (ref 4.40–5.90)
RDW: 15.5 % — AB (ref 11.5–14.5)
WBC: 6.4 10*3/uL (ref 3.8–10.6)

## 2018-02-05 LAB — COMPREHENSIVE METABOLIC PANEL
ALK PHOS: 48 U/L (ref 38–126)
ALT: 22 U/L (ref 0–44)
AST: 24 U/L (ref 15–41)
Albumin: 3.4 g/dL — ABNORMAL LOW (ref 3.5–5.0)
Anion gap: 8 (ref 5–15)
BILIRUBIN TOTAL: 0.5 mg/dL (ref 0.3–1.2)
BUN: 26 mg/dL — ABNORMAL HIGH (ref 8–23)
CALCIUM: 8.7 mg/dL — AB (ref 8.9–10.3)
CO2: 24 mmol/L (ref 22–32)
CREATININE: 1.41 mg/dL — AB (ref 0.61–1.24)
Chloride: 104 mmol/L (ref 98–111)
GFR calc non Af Amer: 49 mL/min — ABNORMAL LOW (ref >60)
GFR, EST AFRICAN AMERICAN: 56 mL/min — AB (ref >60)
GLUCOSE: 138 mg/dL — AB (ref 70–99)
Potassium: 3.5 mmol/L (ref 3.5–5.1)
SODIUM: 136 mmol/L (ref 135–145)
TOTAL PROTEIN: 6.5 g/dL (ref 6.5–8.1)

## 2018-02-05 LAB — TSH: TSH: 7.131 u[IU]/mL — ABNORMAL HIGH (ref 0.350–4.500)

## 2018-02-05 MED ORDER — SODIUM CHLORIDE 0.9 % IV SOLN
Freq: Once | INTRAVENOUS | Status: AC
  Administered 2018-02-05: 10:00:00 via INTRAVENOUS
  Filled 2018-02-05: qty 250

## 2018-02-05 MED ORDER — SODIUM CHLORIDE 0.9 % IV SOLN
1000.0000 mg | Freq: Once | INTRAVENOUS | Status: AC
  Administered 2018-02-05: 1000 mg via INTRAVENOUS
  Filled 2018-02-05: qty 20

## 2018-02-05 MED ORDER — HEPARIN SOD (PORK) LOCK FLUSH 100 UNIT/ML IV SOLN
500.0000 [IU] | Freq: Once | INTRAVENOUS | Status: AC | PRN
  Administered 2018-02-05: 500 [IU]
  Filled 2018-02-05: qty 5

## 2018-02-05 NOTE — Progress Notes (Signed)
Hematology/Oncology Follow Up Note [facility] [phone] [phone]   Patient Care Team: [name], MD as PCP - General (Family Medicine) [name], [name], MD (General Surgery) [name], MD as Consulting Physician (Oncology) [name], RN as Registered Nurse  Reason for Visit:  Follow up for lung cancer, immunotherapy side effects, adrenal insufficiency.  HISTORY OF PRESENTING ILLNESS:  Marc Gray 72 y.o.  male who presents for treatment of Stage IIIB (cT2b, cN3, cM0)  Adenocarcinoma of lung.  # Patient is s/p [place] and taxol concurrent RT for treatment of Stage IIIB Lung cancer. He recieved additional lung boost RT Currently on Durvalumab maintenance.   # Colon CA was listed in his history, however reviewing his surgical pathology from colonoscopy on 04/15/2014, during which he had polyps removed and all are negative for cancer.patient is scheduled for colonoscopy screening this year.  # 5/1/ 2019 CT scan was discussed at tumor board and changes consistent with radiation changes  INTERVAL HISTORY Patient he presents for follow-up of for assessment prior to immunotherapy maintenance for stage IIIb adenocarcinoma of lung.  #Adrenal insufficiency, he follows up with endocrinology.  Takes 10 mg of prednisone daily.  Blood pressure has been stable.  Fatigue has been better. He describes that his energy comes and goes.  Sometimes he was to feel fatigued.  But some days he feels good energy level.  #Acquired hypothyroidism, TSH pending today.  On Synthroid.  150 MCG daily. #Eye irritation has resolved. #Shortness of breath, chronic, stable.  #Weight gain, patient reports that her appetite is not as good as his steroid dosage have been tapered down.  He has lost about 5 pounds intentionally over the past 2 weeks.  He does not exercise much. #Fall, wife reports that patient fell down during a hiking trip while rolling  patient's grandkids.  Patient reports that he had lost balance at that time.  No reports of dizziness, loss consciousness and he was well hydrated that day.  Review of Systems  Constitutional: Negative for appetite change, chills, diaphoresis, fatigue, fever and unexpected weight change.  HENT:   Negative for hearing loss, lump/mass, mouth sores, nosebleeds and sore throat.   Eyes: Negative for eye problems and icterus.  Respiratory: Positive for shortness of breath. Negative for chest tightness, cough, hemoptysis and wheezing.        Chronic SOB with exertion.   Cardiovascular: Negative for chest pain, leg swelling and palpitations.  Gastrointestinal: Negative for abdominal distention, abdominal pain, blood in stool, diarrhea, nausea, rectal pain and vomiting.  Endocrine: Negative for hot flashes.  Genitourinary: Negative for bladder incontinence, difficulty urinating, dysuria, frequency, hematuria and nocturia.   Musculoskeletal: Negative for arthralgias, back pain, flank pain, gait problem and myalgias.  Skin: Negative for itching and rash.  Neurological: Negative for dizziness, gait problem, headaches, light-headedness, numbness and seizures.  Hematological: Negative for adenopathy. Does not bruise/bleed easily.  Psychiatric/Behavioral: Negative for confusion, decreased concentration and depression. The patient is not nervous/anxious.     MEDICAL HISTORY:  Past Medical History:  Diagnosis Date  . Anxiety   . Arthritis    SHOULDER  . Cataract   . Colon cancer ([place]) 2015   Pt states during his colonoscopy he had cancerous polyps removed.   . Depression    SINCE DIAGNOSIS  . Dyspnea    DOE  . Enlarged prostate   . Hypertension   . Lung cancer [facility])    Aug 2018  . Pain  DUE TO LUNG ISSUE    SURGICAL HISTORY: Past Surgical History:  Procedure Laterality Date  . CATARACT EXTRACTION W/ INTRAOCULAR LENS  IMPLANT, BILATERAL    . COLONOSCOPY    . EYE SURGERY    . LYMPH GLAND  EXCISION N/A 02/09/2017   Procedure: CERVICAL LYMPH NODE BIOPSY;  Surgeon: Robert Bellow, MD;  Location: [facility];  Service: General;  Laterality: N/A;  . PORTACATH PLACEMENT Right 02/23/2017   Procedure: INSERTION PORT-A-CATH;  Surgeon: Robert Bellow, MD;  Location: [facility];  Service: General;  Laterality: Right;  . PROSTATE SURGERY    . TONSILLECTOMY      SOCIAL HISTORY: Social History   Socioeconomic History  . Marital status: Married    Spouse name: Not on file  . Number of children: Not on file  . Years of education: Not on file  . Highest education level: Not on file  Occupational History  . Not on file  Social Needs  . Financial resource strain: Not on file  . Food insecurity:    Worry: Not on file    Inability: Not on file  . Transportation needs:    Medical: Not on file    Non-medical: Not on file  Tobacco Use  . Smoking status: Former Smoker    Packs/day: 1.00    Years: 53.00    Pack years: 53.00    Types: Cigarettes    Last attempt to quit: 04/14/2017    Years since quitting: 0.8  . Smokeless tobacco: Never Used  . Tobacco comment: Quit November 2018  Substance and Sexual Activity  . Alcohol use: Yes    Comment: occassional  . Drug use: No  . Sexual activity: Yes  Lifestyle  . Physical activity:    Days per week: Not on file    Minutes per session: Not on file  . Stress: Not on file  Relationships  . Social connections:    Talks on phone: Not on file    Gets together: Not on file    Attends religious service: Not on file    Active member of club or organization: Not on file    Attends meetings of clubs or organizations: Not on file    Relationship status: Not on file  . Intimate partner violence:    Fear of current or ex partner: Not on file    Emotionally abused: Not on file    Physically abused: Not on file    Forced sexual activity: Not on file  Other Topics Concern  . Not on file  Social History Narrative  . Not on file     FAMILY HISTORY: Family History  Problem Relation Age of Onset  . Breast cancer Maternal Grandmother   . Dementia Mother   . Diabetes Father   . Stroke Father     ALLERGIES:  has No Known Allergies.  MEDICATIONS:  Current Outpatient Medications  Medication Sig Dispense Refill  . aspirin 325 MG tablet Take 650 mg by mouth daily.     . budesonide-formoterol (SYMBICORT) 160-4.5 MCG/ACT inhaler Inhale into the lungs. Inhale 2 inhalations into the lungs 2 times daily    . calcium-vitamin D (OSCAL WITH D) 500-200 MG-UNIT tablet Take 1 tablet by mouth daily. 90 tablet 1  . Chlorpheniramine-Phenylephrine (SINUS & ALLERGY) 4-10 MG tablet Take 1 tablet by mouth daily as needed.     . diphenhydrAMINE-zinc acetate (BENADRYL EXTRA STRENGTH) cream Apply 1 application topically 3 (three) times daily as needed for itching.  28 g 0  . fluticasone (FLONASE) 50 MCG/ACT nasal spray Place 1 spray into both nostrils daily. 16 g 2  . levothyroxine (SYNTHROID, LEVOTHROID) 150 MCG tablet Take by mouth. Take 1 tablet (150 mcg total) by mouth once daily Take on an empty stomach with a glass of water at least 30-60 minutes before breakfast.    . lidocaine-prilocaine (EMLA) cream Apply over port 1-2 hours prior to chemotherapy treatment.  Cover with plastic wrap. 30 g 1  . Melatonin 10 MG TABS Take 10 mg by mouth at bedtime as needed.    . Multiple Vitamin (MULTI-VITAMINS) TABS Take 1 tablet by mouth daily.     . ondansetron (ZOFRAN) 8 MG tablet Take 1 tablet (8 mg total) by mouth every 8 (eight) hours as needed for nausea or vomiting. 60 tablet 1  . pantoprazole (PROTONIX) 40 MG tablet Take 1 tablet (40 mg total) by mouth daily. 30 tablet 2  . predniSONE (DELTASONE) 10 MG tablet Take one tablet each morning. Take one-half tablet each afternoon (take between 2 - 5 PM)    . PROAIR HFA 108 (90 Base) MCG/ACT inhaler INHALE 2 PUFFS BY MOUTH EVERY 6 HOURS AS NEEDED 8.5 g 0  . prochlorperazine (COMPAZINE) 10 MG tablet  Take 1 tablet (10 mg total) by mouth every 6 (six) hours as needed for nausea or vomiting. 60 tablet 2  . tiotropium (SPIRIVA HANDIHALER) 18 MCG inhalation capsule Place 1 capsule (18 mcg total) into inhaler and inhale daily. 30 capsule 1  . triamcinolone ointment (KENALOG) 0.5 % Apply 1 application topically 3 (three) times daily as needed. 30 g 0   No current facility-administered medications for this visit.    Facility-Administered Medications Ordered in Other Visits  Medication Dose Route Frequency Provider Last Rate Last Dose  . durvalumab (IMFINZI) 1,000 mg in sodium chloride 0.9 % 100 mL chemo infusion  1,000 mg Intravenous Once [name], MD 120 mL/hr at 02/05/18 1024 1,000 mg at 02/05/18 1024  . heparin lock flush 100 unit/mL  500 Units Intracatheter Once PRN [name], MD      . sodium chloride flush (NS) 0.9 % injection 10 mL  10 mL Intravenous PRN [name], MD   10 mL at 04/03/17 1607      .  PHYSICAL EXAMINATION: ECOG PERFORMANCE STATUS: 1 - Symptomatic but completely ambulatory Vitals:   02/05/18 0905  BP: 115/72  Pulse: 69  Resp: 16  Temp: (!) 96.1 F (35.6 C)  SpO2: 95%   Filed Weights   02/05/18 0905  Weight: 213 lb 6 oz (96.8 kg)  Physical Exam  Constitutional: He is oriented to person, place, and time and well-developed, well-nourished, and in no distress. No distress.  HENT:  Head: Normocephalic and atraumatic.  Right Ear: External ear normal.  Left Ear: External ear normal.  Nose: Nose normal.  Mouth/Throat: Oropharynx is clear and moist. No oropharyngeal exudate.  Eyes: Pupils are equal, round, and reactive to light. EOM are normal. Right eye exhibits no discharge. Left eye exhibits no discharge. No scleral icterus.  Neck: Normal range of motion. Neck supple. No JVD present.  Cardiovascular: Normal rate, regular rhythm and normal heart sounds.  No murmur heard. Pulmonary/Chest: Effort normal and breath sounds normal. No stridor. No respiratory distress.  He has no wheezes. He has no rales. He exhibits no tenderness.  Decreased sound bilaterally lower lobe.   Abdominal: Soft. Bowel sounds are normal. He exhibits no distension and no mass. There is no  tenderness. There is no rebound.  Musculoskeletal: Normal range of motion. He exhibits no edema or tenderness.  Lymphadenopathy:    He has no cervical adenopathy.  Neurological: He is alert and oriented to person, place, and time. No cranial nerve deficit. He exhibits normal muscle tone. Coordination normal.  Skin: Skin is warm and dry. No rash noted. He is not diaphoretic. No erythema.  Psychiatric: Memory, affect and judgment normal.   LABORATORY DATA:  I have reviewed the data as listed Lab Results  Component Value Date   WBC 6.4 02/05/2018   HGB 12.6 (L) 02/05/2018   HCT 37.6 (L) 02/05/2018   MCV 93.5 02/05/2018   PLT 241 02/05/2018   Recent Labs    01/08/18 0856 01/22/18 0855 02/05/18 0841  NA 135 138 136  K 4.7 4.0 3.5  CL 101 103 104  CO2 26 27 24   GLUCOSE 120* 108* 138*  BUN 42* 32* 26*  CREATININE 1.32* 1.32* 1.41*  CALCIUM 9.1 8.7* 8.7*  GFRNONAA 53* 53* 49*  GFRAA >60 >60 56*  PROT 6.6 6.2* 6.5  ALBUMIN 3.6 3.4* 3.4*  AST 22 21 24   ALT 31 22 22   ALKPHOS 49 50 48  BILITOT 0.5 0.6 0.5   RADIOGRAPHIC STUDIES: I have personally reviewed the radiological images as listed and agreed with the findings in the report.  12/17/2017  CAT scan was independently reviewed and discussed with patient.  Image showed a stable masslike consolidation in the left upper lobe with evolving radiation therapy changes in the medial left hemithorax.  Subpleural right upper lobe nodule is unchanged.  Small /pericardial effusion, slightly increased from prior. Patient had a new T2 superior endplate compression fracture. Aortic atherosclerosis, three-vessel coronary artery calcification.  Ascending aortic aneurysm.  Emphysema.  12/31/2017   ASSESSMENT & PLAN:  Cancer Staging Adenocarcinoma  of left lung, stage 3 (HCC) Staging form: Lung, AJCC 8th Edition - Clinical stage from 02/14/2017: Stage IIIB (cT2b, cN3, cM0) - Signed by [name], MD on 02/14/2017  1. Adenocarcinoma of left lung, stage 3 ([place])   2. Hypothyroidism due to medication   3. Adrenal insufficiency ([place])   4. Encounter for antineoplastic immunotherapy   5. Weight gain due to medication   6. Compression fracture of body of thoracic vertebra (HCC)    #Stage IIIB lung adenocarcinoma,  Labs reviewed and discussed with patient.  Counts acceptable to proceed with today's durvalumab.  Stable disease.  #Adrenal insufficiency, follow-up with Dr.Solum.  His steroid dose have been decreased to 10 mg prednisone daily.  Today's blood pressure is 115/72, stable. #Hypothyroidism, continue Synthroid 150 MCG daily.. TSH pending.  #Weight gain, likely secondary to glucocorticoid use.  Over the past 2 weeks, he has successfully lost 5 pounds intentionally. # Fall, mechanical. We discussed about complications of long-term steroid use, which includes weight gain, muscle weakness. I encourage patient to increase exercise [ walking] as tolerated  . 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  Bone density scan was independently reviewed and reveals normal bone density.  Suggest patient to continue take vitamin D and calcium supplements. We spent sufficient time to discuss many aspect of care, questions were answered to patient's satisfaction. Total face to face encounter time for this patient visit was 25 min. >50% of the time was  spent in counseling and coordination of care.   [name], MD, PhD Hematology Oncology [facility] at [facility] Pager- [phone] 02/05/2018

## 2018-02-05 NOTE — Progress Notes (Signed)
Patient here today for follow up and chemotherapy, Patient states no new concerns today

## 2018-02-18 ENCOUNTER — Other Ambulatory Visit: Payer: Self-pay | Admitting: *Deleted

## 2018-02-18 DIAGNOSIS — C3492 Malignant neoplasm of unspecified part of left bronchus or lung: Secondary | ICD-10-CM

## 2018-02-19 ENCOUNTER — Encounter: Payer: Self-pay | Admitting: Oncology

## 2018-02-19 ENCOUNTER — Other Ambulatory Visit: Payer: Self-pay

## 2018-02-19 ENCOUNTER — Inpatient Hospital Stay: Payer: PPO

## 2018-02-19 ENCOUNTER — Inpatient Hospital Stay (HOSPITAL_BASED_OUTPATIENT_CLINIC_OR_DEPARTMENT_OTHER): Payer: PPO | Admitting: Oncology

## 2018-02-19 VITALS — BP 101/69 | HR 88 | Temp 97.5°F | Resp 18 | Wt 211.3 lb

## 2018-02-19 DIAGNOSIS — Z9221 Personal history of antineoplastic chemotherapy: Secondary | ICD-10-CM

## 2018-02-19 DIAGNOSIS — N400 Enlarged prostate without lower urinary tract symptoms: Secondary | ICD-10-CM

## 2018-02-19 DIAGNOSIS — R634 Abnormal weight loss: Secondary | ICD-10-CM

## 2018-02-19 DIAGNOSIS — Z79899 Other long term (current) drug therapy: Secondary | ICD-10-CM

## 2018-02-19 DIAGNOSIS — Z7982 Long term (current) use of aspirin: Secondary | ICD-10-CM

## 2018-02-19 DIAGNOSIS — I10 Essential (primary) hypertension: Secondary | ICD-10-CM

## 2018-02-19 DIAGNOSIS — E2740 Unspecified adrenocortical insufficiency: Secondary | ICD-10-CM | POA: Diagnosis not present

## 2018-02-19 DIAGNOSIS — C3492 Malignant neoplasm of unspecified part of left bronchus or lung: Secondary | ICD-10-CM

## 2018-02-19 DIAGNOSIS — C770 Secondary and unspecified malignant neoplasm of lymph nodes of head, face and neck: Secondary | ICD-10-CM

## 2018-02-19 DIAGNOSIS — Z5112 Encounter for antineoplastic immunotherapy: Secondary | ICD-10-CM

## 2018-02-19 DIAGNOSIS — X58XXXS Exposure to other specified factors, sequela: Secondary | ICD-10-CM

## 2018-02-19 DIAGNOSIS — Z87891 Personal history of nicotine dependence: Secondary | ICD-10-CM

## 2018-02-19 DIAGNOSIS — F418 Other specified anxiety disorders: Secondary | ICD-10-CM

## 2018-02-19 DIAGNOSIS — E032 Hypothyroidism due to medicaments and other exogenous substances: Secondary | ICD-10-CM

## 2018-02-19 DIAGNOSIS — Z923 Personal history of irradiation: Secondary | ICD-10-CM | POA: Diagnosis not present

## 2018-02-19 DIAGNOSIS — M1990 Unspecified osteoarthritis, unspecified site: Secondary | ICD-10-CM

## 2018-02-19 DIAGNOSIS — R5383 Other fatigue: Secondary | ICD-10-CM

## 2018-02-19 DIAGNOSIS — H5789 Other specified disorders of eye and adnexa: Secondary | ICD-10-CM | POA: Diagnosis not present

## 2018-02-19 DIAGNOSIS — N183 Chronic kidney disease, stage 3 unspecified: Secondary | ICD-10-CM

## 2018-02-19 DIAGNOSIS — C3412 Malignant neoplasm of upper lobe, left bronchus or lung: Secondary | ICD-10-CM

## 2018-02-19 DIAGNOSIS — D631 Anemia in chronic kidney disease: Secondary | ICD-10-CM

## 2018-02-19 DIAGNOSIS — Z7952 Long term (current) use of systemic steroids: Secondary | ICD-10-CM

## 2018-02-19 DIAGNOSIS — Z9181 History of falling: Secondary | ICD-10-CM

## 2018-02-19 DIAGNOSIS — M4854XS Collapsed vertebra, not elsewhere classified, thoracic region, sequela of fracture: Secondary | ICD-10-CM

## 2018-02-19 DIAGNOSIS — Z86010 Personal history of colonic polyps: Secondary | ICD-10-CM

## 2018-02-19 LAB — COMPREHENSIVE METABOLIC PANEL
ALBUMIN: 3.4 g/dL — AB (ref 3.5–5.0)
ALK PHOS: 46 U/L (ref 38–126)
ALT: 21 U/L (ref 0–44)
AST: 25 U/L (ref 15–41)
Anion gap: 9 (ref 5–15)
BUN: 21 mg/dL (ref 8–23)
CO2: 23 mmol/L (ref 22–32)
CREATININE: 1.73 mg/dL — AB (ref 0.61–1.24)
Calcium: 9 mg/dL (ref 8.9–10.3)
Chloride: 105 mmol/L (ref 98–111)
GFR calc non Af Amer: 38 mL/min — ABNORMAL LOW (ref >60)
GFR, EST AFRICAN AMERICAN: 44 mL/min — AB (ref >60)
Glucose, Bld: 106 mg/dL — ABNORMAL HIGH (ref 70–99)
Potassium: 4.1 mmol/L (ref 3.5–5.1)
SODIUM: 137 mmol/L (ref 135–145)
Total Bilirubin: 0.8 mg/dL (ref 0.3–1.2)
Total Protein: 6.6 g/dL (ref 6.5–8.1)

## 2018-02-19 LAB — CBC WITH DIFFERENTIAL/PLATELET
Basophils Absolute: 0 10*3/uL (ref 0–0.1)
Basophils Relative: 1 %
EOS ABS: 0.4 10*3/uL (ref 0–0.7)
Eosinophils Relative: 9 %
HEMATOCRIT: 36.2 % — AB (ref 40.0–52.0)
HEMOGLOBIN: 12.6 g/dL — AB (ref 13.0–18.0)
LYMPHS ABS: 1.3 10*3/uL (ref 1.0–3.6)
Lymphocytes Relative: 24 %
MCH: 31.8 pg (ref 26.0–34.0)
MCHC: 34.8 g/dL (ref 32.0–36.0)
MCV: 91.3 fL (ref 80.0–100.0)
MONO ABS: 0.8 10*3/uL (ref 0.2–1.0)
MONOS PCT: 15 %
NEUTROS ABS: 2.7 10*3/uL (ref 1.4–6.5)
NEUTROS PCT: 51 %
Platelets: 212 10*3/uL (ref 150–440)
RBC: 3.96 MIL/uL — ABNORMAL LOW (ref 4.40–5.90)
RDW: 15 % — AB (ref 11.5–14.5)
WBC: 5.3 10*3/uL (ref 3.8–10.6)

## 2018-02-19 MED ORDER — HEPARIN SOD (PORK) LOCK FLUSH 100 UNIT/ML IV SOLN
500.0000 [IU] | Freq: Once | INTRAVENOUS | Status: AC
  Administered 2018-02-19: 500 [IU] via INTRAVENOUS
  Filled 2018-02-19: qty 5

## 2018-02-19 MED ORDER — METHYLPREDNISOLONE SODIUM SUCC 40 MG IJ SOLR
20.0000 mg | Freq: Once | INTRAMUSCULAR | Status: AC
  Administered 2018-02-19: 20 mg via INTRAVENOUS
  Filled 2018-02-19: qty 1

## 2018-02-19 MED ORDER — PREDNISONE 10 MG PO TABS: ORAL_TABLET | ORAL | 0 refills | Status: DC

## 2018-02-19 MED ORDER — SODIUM CHLORIDE 0.9 % IV SOLN
1000.0000 mg | Freq: Once | INTRAVENOUS | Status: AC
  Administered 2018-02-19: 1000 mg via INTRAVENOUS
  Filled 2018-02-19: qty 20

## 2018-02-19 MED ORDER — SODIUM CHLORIDE 0.9% FLUSH
10.0000 mL | Freq: Once | INTRAVENOUS | Status: DC
  Filled 2018-02-19: qty 10

## 2018-02-19 MED ORDER — METHYLPREDNISOLONE SODIUM SUCC 40 MG IJ SOLR
20.0000 mg | Freq: Once | INTRAMUSCULAR | Status: DC
  Filled 2018-02-19: qty 1

## 2018-02-19 MED ORDER — SODIUM CHLORIDE 0.9 % IV SOLN
Freq: Once | INTRAVENOUS | Status: AC
  Administered 2018-02-19: 09:00:00 via INTRAVENOUS
  Filled 2018-02-19: qty 250

## 2018-02-19 NOTE — Progress Notes (Signed)
Hematology/Oncology Follow Up Note [facility] [phone] [phone]   Patient Care Team: [name], MD as PCP - General (Family Medicine) [name], [name], MD (General Surgery) [name], MD as Consulting Physician (Oncology) [name], RN as Registered Nurse  Reason for Visit:  Follow up for lung cancer, immunotherapy side effects, adrenal insufficiency.  HISTORY OF PRESENTING ILLNESS:  Marc Gray 72 y.o.  male who presents for treatment of Stage IIIB (cT2b, cN3, cM0)  Adenocarcinoma of lung.  # Patient is s/p [place] and taxol concurrent RT for treatment of Stage IIIB Lung cancer. He recieved additional lung boost RT Currently on Durvalumab maintenance.   # Colon CA was listed in his history, however reviewing his surgical pathology from colonoscopy on 04/15/2014, during which he had polyps removed and all are negative for cancer.patient is scheduled for colonoscopy screening this year.  # 5/1/ 2019 CT scan was discussed at tumor board and changes consistent with radiation changes  INTERVAL HISTORY Patient he presents for follow-up of for assessment prior to immunotherapy maintenance for stage IIIb adenocarcinoma of lung. #Adrenal insufficiency, he follows up with endocrinology.  He supposed to take 10 mg of prednisone in the morning and 5 mg prednisone in the evening.  He reports that he has run out of his prednisone totally for about a week.  He did not call Dr. Gabriel Carina or my office for refill. He reports feeling fatigue today.  Blood pressure is borderline.  Reports that at home his blood pressure has been low around 90s.  In the clinic his blood pressure was 101/69. Also reports appetite has decreased.  Not eating much.  #Acquired hypothyroidism, most recent TSH 7.1..  On Synthroid 150 MCG daily.  Follows up with endocrinology  #Shortness of breath, chronic stable. He lost 2 pounds during interval.  Review of  Systems  Constitutional: Positive for fatigue. Negative for appetite change, chills, diaphoresis, fever and unexpected weight change.  HENT:   Negative for hearing loss, lump/mass, mouth sores, nosebleeds and sore throat.   Eyes: Negative for eye problems and icterus.  Respiratory: Positive for shortness of breath. Negative for chest tightness, cough, hemoptysis and wheezing.        Chronic SOB with exertion.   Cardiovascular: Negative for chest pain, leg swelling and palpitations.  Gastrointestinal: Negative for abdominal distention, abdominal pain, blood in stool, diarrhea, nausea, rectal pain and vomiting.  Endocrine: Negative for hot flashes.  Genitourinary: Negative for bladder incontinence, difficulty urinating, dysuria, frequency, hematuria and nocturia.   Musculoskeletal: Negative for arthralgias, back pain, flank pain, gait problem and myalgias.  Skin: Negative for itching and rash.  Neurological: Negative for dizziness, gait problem, headaches, light-headedness, numbness and seizures.  Hematological: Negative for adenopathy. Does not bruise/bleed easily.  Psychiatric/Behavioral: Negative for confusion, decreased concentration and depression. The patient is not nervous/anxious.     MEDICAL HISTORY:  Past Medical History:  Diagnosis Date  . Anxiety   . Arthritis    SHOULDER  . Cataract   . Colon cancer ([place]) 2015   Pt states during his colonoscopy he had cancerous polyps removed.   . Depression    SINCE DIAGNOSIS  . Dyspnea    DOE  . Enlarged prostate   . Hypertension   . Lung cancer [facility])    Aug 2018  . Pain    DUE TO LUNG ISSUE    SURGICAL HISTORY: Past Surgical History:  Procedure Laterality Date  . CATARACT EXTRACTION W/ INTRAOCULAR LENS  IMPLANT, BILATERAL    . COLONOSCOPY    . EYE SURGERY    . LYMPH GLAND EXCISION N/A 02/09/2017   Procedure: CERVICAL LYMPH NODE BIOPSY;  Surgeon: Robert Bellow, MD;  Location: [facility];  Service: General;  Laterality: N/A;    . PORTACATH PLACEMENT Right 02/23/2017   Procedure: INSERTION PORT-A-CATH;  Surgeon: Robert Bellow, MD;  Location: [facility];  Service: General;  Laterality: Right;  . PROSTATE SURGERY    . TONSILLECTOMY      SOCIAL HISTORY: Social History   Socioeconomic History  . Marital status: Married    Spouse name: Not on file  . Number of children: Not on file  . Years of education: Not on file  . Highest education level: Not on file  Occupational History  . Not on file  Social Needs  . Financial resource strain: Not on file  . Food insecurity:    Worry: Not on file    Inability: Not on file  . Transportation needs:    Medical: Not on file    Non-medical: Not on file  Tobacco Use  . Smoking status: Former Smoker    Packs/day: 1.00    Years: 53.00    Pack years: 53.00    Types: Cigarettes    Last attempt to quit: 04/14/2017    Years since quitting: 0.8  . Smokeless tobacco: Never Used  . Tobacco comment: Quit November 2018  Substance and Sexual Activity  . Alcohol use: Yes    Comment: occassional  . Drug use: No  . Sexual activity: Yes  Lifestyle  . Physical activity:    Days per week: Not on file    Minutes per session: Not on file  . Stress: Not on file  Relationships  . Social connections:    Talks on phone: Not on file    Gets together: Not on file    Attends religious service: Not on file    Active member of club or organization: Not on file    Attends meetings of clubs or organizations: Not on file    Relationship status: Not on file  . Intimate partner violence:    Fear of current or ex partner: Not on file    Emotionally abused: Not on file    Physically abused: Not on file    Forced sexual activity: Not on file  Other Topics Concern  . Not on file  Social History Narrative  . Not on file    FAMILY HISTORY: Family History  Problem Relation Age of Onset  . Breast cancer Maternal Grandmother   . Dementia Mother   . Diabetes Father   . Stroke  Father     ALLERGIES:  has No Known Allergies.  MEDICATIONS:  Current Outpatient Medications  Medication Sig Dispense Refill  . aspirin 325 MG tablet Take 650 mg by mouth daily.     . budesonide-formoterol (SYMBICORT) 160-4.5 MCG/ACT inhaler Inhale into the lungs. Inhale 2 inhalations into the lungs 2 times daily    . calcium-vitamin D (OSCAL WITH D) 500-200 MG-UNIT tablet Take 1 tablet by mouth daily. 90 tablet 1  . Chlorpheniramine-Phenylephrine (SINUS & ALLERGY) 4-10 MG tablet Take 1 tablet by mouth daily as needed.     . diphenhydrAMINE-zinc acetate (BENADRYL EXTRA STRENGTH) cream Apply 1 application topically 3 (three) times daily as needed for itching. 28 g 0  . fluticasone (FLONASE) 50 MCG/ACT nasal spray Place 1 spray into both nostrils daily. 16 g 2  .  levothyroxine (SYNTHROID, LEVOTHROID) 150 MCG tablet Take by mouth. Take 1 tablet (150 mcg total) by mouth once daily Take on an empty stomach with a glass of water at least 30-60 minutes before breakfast.    . lidocaine-prilocaine (EMLA) cream Apply over port 1-2 hours prior to chemotherapy treatment.  Cover with plastic wrap. 30 g 1  . Melatonin 10 MG TABS Take 10 mg by mouth at bedtime as needed.    . Multiple Vitamin (MULTI-VITAMINS) TABS Take 1 tablet by mouth daily.     . ondansetron (ZOFRAN) 8 MG tablet Take 1 tablet (8 mg total) by mouth every 8 (eight) hours as needed for nausea or vomiting. 60 tablet 1  . pantoprazole (PROTONIX) 40 MG tablet Take 1 tablet (40 mg total) by mouth daily. 30 tablet 2  . PROAIR HFA 108 (90 Base) MCG/ACT inhaler INHALE 2 PUFFS BY MOUTH EVERY 6 HOURS AS NEEDED 8.5 g 0  . prochlorperazine (COMPAZINE) 10 MG tablet Take 1 tablet (10 mg total) by mouth every 6 (six) hours as needed for nausea or vomiting. 60 tablet 2  . tiotropium (SPIRIVA HANDIHALER) 18 MCG inhalation capsule Place 1 capsule (18 mcg total) into inhaler and inhale daily. 30 capsule 1  . triamcinolone ointment (KENALOG) 0.5 % Apply 1  application topically 3 (three) times daily as needed. 30 g 0  . predniSONE (DELTASONE) 10 MG tablet Take one tablet each morning. Take one-half tablet each afternoon (take between 2 - 5 PM) 45 tablet 0   No current facility-administered medications for this visit.    Facility-Administered Medications Ordered in Other Visits  Medication Dose Route Frequency Provider Last Rate Last Dose  . sodium chloride flush (NS) 0.9 % injection 10 mL  10 mL Intravenous PRN [name], MD   10 mL at 04/03/17 4496  . sodium chloride flush (NS) 0.9 % injection 10 mL  10 mL Intravenous Once [name], MD          .  PHYSICAL EXAMINATION: ECOG PERFORMANCE STATUS: 1 - Symptomatic but completely ambulatory Vitals:   02/19/18 0839  BP: 101/69  Pulse: 88  Resp: 18  Temp: (!) 97.5 F (36.4 C)  SpO2: 94%   Filed Weights   02/19/18 0839  Weight: 211 lb 4.8 oz (95.8 kg)  Physical Exam  Constitutional: He is oriented to person, place, and time. No distress.  HENT:  Head: Normocephalic and atraumatic.  Right Ear: External ear normal.  Left Ear: External ear normal.  Nose: Nose normal.  Mouth/Throat: Oropharynx is clear and moist. No oropharyngeal exudate.  Eyes: Pupils are equal, round, and reactive to light. EOM are normal. Right eye exhibits no discharge. Left eye exhibits no discharge. No scleral icterus.  Neck: Normal range of motion. Neck supple. No JVD present.  Cardiovascular: Normal rate, regular rhythm and normal heart sounds.  No murmur heard. Pulmonary/Chest: Effort normal. No stridor. No respiratory distress. He has no wheezes. He has no rales. He exhibits no tenderness.  Decreased sound bilaterally lower lobe.   Abdominal: Soft. Bowel sounds are normal. He exhibits no distension and no mass. There is no tenderness. There is no rebound.  Musculoskeletal: Normal range of motion. He exhibits no edema or tenderness.  Lymphadenopathy:    He has no cervical adenopathy.  Neurological: He is alert  and oriented to person, place, and time. No cranial nerve deficit. He exhibits normal muscle tone. Coordination normal.  Skin: Skin is warm and dry. No rash noted. He is not  diaphoretic. No erythema.  Psychiatric: Memory, affect and judgment normal.   LABORATORY DATA:  I have reviewed the data as listed Lab Results  Component Value Date   WBC 5.3 02/19/2018   HGB 12.6 (L) 02/19/2018   HCT 36.2 (L) 02/19/2018   MCV 91.3 02/19/2018   PLT 212 02/19/2018   Recent Labs    01/22/18 0855 02/05/18 0841 02/19/18 0831  NA 138 136 137  K 4.0 3.5 4.1  CL 103 104 105  CO2 27 24 23   GLUCOSE 108* 138* 106*  BUN 32* 26* 21  CREATININE 1.32* 1.41* 1.73*  CALCIUM 8.7* 8.7* 9.0  GFRNONAA 53* 49* 38*  GFRAA >60 56* 44*  PROT 6.2* 6.5 6.6  ALBUMIN 3.4* 3.4* 3.4*  AST 21 24 25   ALT 22 22 21   ALKPHOS 50 48 46  BILITOT 0.6 0.5 0.8   RADIOGRAPHIC STUDIES: I have personally reviewed the radiological images as listed and agreed with the findings in the report.  12/17/2017  CAT scan was independently reviewed and discussed with patient.  Image showed a stable masslike consolidation in the left upper lobe with evolving radiation therapy changes in the medial left hemithorax.  Subpleural right upper lobe nodule is unchanged.  Small /pericardial effusion, slightly increased from prior. Patient had a new T2 superior endplate compression fracture. Aortic atherosclerosis, three-vessel coronary artery calcification.  Ascending aortic aneurysm.  Emphysema.  12/31/2017   ASSESSMENT & PLAN:  Cancer Staging Adenocarcinoma of left lung, stage 3 (HCC) Staging form: Lung, AJCC 8th Edition - Clinical stage from 02/14/2017: Stage IIIB (cT2b, cN3, cM0) - Signed by [name], MD on 02/14/2017  1. Adenocarcinoma of left lung, stage 3 ([place])   2. Hypothyroidism due to medication   3. Adrenal insufficiency ([place])   4. Encounter for antineoplastic immunotherapy   5. Other fatigue   6. Anemia in stage 3 chronic kidney  disease ([place])    #Stage IIIB lung adenocarcinoma,  Labs reviewed and discussed with patient.  Counts are acceptable to proceed with today's durvalumab.  Stable disease.  Plan repeat CT scan in October.   #Adrenal insufficiency, follow-up with Dr.Solum.  He ran out of prednisone for about a week.  Feeling weak and blood pressure also trending low. I will give him 1 dose of Solu-Medrol 20 mg IV x1.  Refilled his prednisone prescription 10 mg in a.m. and 5 mg in p.m.   Discussed with patient that he should either call Dr. Gabriel Carina or my office to get a refill before completely ran out.  #Hypothyroidism, continue Synthroid 150 MCG daily. Follow up with Dr.Solum.  . 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  Bone density scan was independently reviewed and reveals normal bone density.  Continue calcium and vitamin D supplements.  # Anemia/CKD, Creatinine 1.7, slightly up. Continue monitor.hemoglobin stable.  We spent sufficient time to discuss many aspect of care, questions were answered to patient's satisfaction.  Total face to face encounter time for this patient visit was 45min. >50% of the time was  spent in counseling and coordination of care.   [name], MD, PhD Hematology Oncology [facility] [employer] Pager- [phone] 02/19/2018

## 2018-02-19 NOTE — Progress Notes (Signed)
Patient to start prednisone 15mg  tomorrow.  Will get solumedrol in clinic today with imfinzi

## 2018-02-19 NOTE — Progress Notes (Signed)
Patient here for follow up. Pt concerned that blood pressure might be low. Currently 101/69. States feeling dizzy /lightheaded occasionally.

## 2018-03-05 ENCOUNTER — Inpatient Hospital Stay (HOSPITAL_BASED_OUTPATIENT_CLINIC_OR_DEPARTMENT_OTHER): Payer: PPO | Admitting: Oncology

## 2018-03-05 ENCOUNTER — Encounter: Payer: Self-pay | Admitting: Oncology

## 2018-03-05 ENCOUNTER — Inpatient Hospital Stay: Payer: PPO

## 2018-03-05 ENCOUNTER — Inpatient Hospital Stay: Payer: PPO | Attending: Oncology

## 2018-03-05 VITALS — BP 138/86 | HR 78 | Temp 96.3°F | Resp 16 | Wt 219.0 lb

## 2018-03-05 DIAGNOSIS — R0602 Shortness of breath: Secondary | ICD-10-CM | POA: Diagnosis not present

## 2018-03-05 DIAGNOSIS — R635 Abnormal weight gain: Secondary | ICD-10-CM | POA: Diagnosis not present

## 2018-03-05 DIAGNOSIS — M4854XA Collapsed vertebra, not elsewhere classified, thoracic region, initial encounter for fracture: Secondary | ICD-10-CM | POA: Insufficient documentation

## 2018-03-05 DIAGNOSIS — N400 Enlarged prostate without lower urinary tract symptoms: Secondary | ICD-10-CM

## 2018-03-05 DIAGNOSIS — E2740 Unspecified adrenocortical insufficiency: Secondary | ICD-10-CM

## 2018-03-05 DIAGNOSIS — I129 Hypertensive chronic kidney disease with stage 1 through stage 4 chronic kidney disease, or unspecified chronic kidney disease: Secondary | ICD-10-CM | POA: Diagnosis not present

## 2018-03-05 DIAGNOSIS — Z79899 Other long term (current) drug therapy: Secondary | ICD-10-CM | POA: Diagnosis not present

## 2018-03-05 DIAGNOSIS — R5383 Other fatigue: Secondary | ICD-10-CM | POA: Insufficient documentation

## 2018-03-05 DIAGNOSIS — I700 Atherosclerosis of aorta: Secondary | ICD-10-CM | POA: Diagnosis not present

## 2018-03-05 DIAGNOSIS — Z923 Personal history of irradiation: Secondary | ICD-10-CM | POA: Insufficient documentation

## 2018-03-05 DIAGNOSIS — C3412 Malignant neoplasm of upper lobe, left bronchus or lung: Secondary | ICD-10-CM | POA: Insufficient documentation

## 2018-03-05 DIAGNOSIS — F418 Other specified anxiety disorders: Secondary | ICD-10-CM | POA: Diagnosis not present

## 2018-03-05 DIAGNOSIS — J439 Emphysema, unspecified: Secondary | ICD-10-CM | POA: Insufficient documentation

## 2018-03-05 DIAGNOSIS — R338 Other retention of urine: Secondary | ICD-10-CM | POA: Insufficient documentation

## 2018-03-05 DIAGNOSIS — C770 Secondary and unspecified malignant neoplasm of lymph nodes of head, face and neck: Secondary | ICD-10-CM | POA: Diagnosis not present

## 2018-03-05 DIAGNOSIS — D631 Anemia in chronic kidney disease: Secondary | ICD-10-CM | POA: Insufficient documentation

## 2018-03-05 DIAGNOSIS — T50905A Adverse effect of unspecified drugs, medicaments and biological substances, initial encounter: Secondary | ICD-10-CM

## 2018-03-05 DIAGNOSIS — Z5112 Encounter for antineoplastic immunotherapy: Secondary | ICD-10-CM | POA: Diagnosis not present

## 2018-03-05 DIAGNOSIS — Z87891 Personal history of nicotine dependence: Secondary | ICD-10-CM

## 2018-03-05 DIAGNOSIS — M1990 Unspecified osteoarthritis, unspecified site: Secondary | ICD-10-CM | POA: Diagnosis not present

## 2018-03-05 DIAGNOSIS — N183 Chronic kidney disease, stage 3 unspecified: Secondary | ICD-10-CM

## 2018-03-05 DIAGNOSIS — T380X5A Adverse effect of glucocorticoids and synthetic analogues, initial encounter: Secondary | ICD-10-CM | POA: Diagnosis not present

## 2018-03-05 DIAGNOSIS — Z9221 Personal history of antineoplastic chemotherapy: Secondary | ICD-10-CM | POA: Diagnosis not present

## 2018-03-05 DIAGNOSIS — N401 Enlarged prostate with lower urinary tract symptoms: Secondary | ICD-10-CM | POA: Diagnosis not present

## 2018-03-05 DIAGNOSIS — Z7952 Long term (current) use of systemic steroids: Secondary | ICD-10-CM | POA: Diagnosis not present

## 2018-03-05 DIAGNOSIS — E032 Hypothyroidism due to medicaments and other exogenous substances: Secondary | ICD-10-CM | POA: Insufficient documentation

## 2018-03-05 DIAGNOSIS — C3492 Malignant neoplasm of unspecified part of left bronchus or lung: Secondary | ICD-10-CM

## 2018-03-05 DIAGNOSIS — Z7982 Long term (current) use of aspirin: Secondary | ICD-10-CM | POA: Insufficient documentation

## 2018-03-05 LAB — COMPREHENSIVE METABOLIC PANEL
ALBUMIN: 3.7 g/dL (ref 3.5–5.0)
ALT: 20 U/L (ref 0–44)
ANION GAP: 9 (ref 5–15)
AST: 22 U/L (ref 15–41)
Alkaline Phosphatase: 51 U/L (ref 38–126)
BUN: 25 mg/dL — AB (ref 8–23)
CO2: 26 mmol/L (ref 22–32)
Calcium: 9.1 mg/dL (ref 8.9–10.3)
Chloride: 105 mmol/L (ref 98–111)
Creatinine, Ser: 1.48 mg/dL — ABNORMAL HIGH (ref 0.61–1.24)
GFR calc Af Amer: 53 mL/min — ABNORMAL LOW (ref >60)
GFR calc non Af Amer: 46 mL/min — ABNORMAL LOW (ref >60)
GLUCOSE: 125 mg/dL — AB (ref 70–99)
POTASSIUM: 3.7 mmol/L (ref 3.5–5.1)
Sodium: 140 mmol/L (ref 135–145)
Total Bilirubin: 0.6 mg/dL (ref 0.3–1.2)
Total Protein: 6.5 g/dL (ref 6.5–8.1)

## 2018-03-05 LAB — CBC WITH DIFFERENTIAL/PLATELET
Basophils Absolute: 0.1 10*3/uL (ref 0–0.1)
Basophils Relative: 1 %
EOS ABS: 0.2 10*3/uL (ref 0–0.7)
Eosinophils Relative: 2 %
HEMATOCRIT: 38.9 % — AB (ref 40.0–52.0)
HEMOGLOBIN: 13.1 g/dL (ref 13.0–18.0)
LYMPHS ABS: 1.1 10*3/uL (ref 1.0–3.6)
LYMPHS PCT: 11 %
MCH: 31.3 pg (ref 26.0–34.0)
MCHC: 33.8 g/dL (ref 32.0–36.0)
MCV: 92.7 fL (ref 80.0–100.0)
Monocytes Absolute: 0.7 10*3/uL (ref 0.2–1.0)
Monocytes Relative: 7 %
NEUTROS ABS: 7.6 10*3/uL — AB (ref 1.4–6.5)
NEUTROS PCT: 79 %
Platelets: 189 10*3/uL (ref 150–440)
RBC: 4.19 MIL/uL — ABNORMAL LOW (ref 4.40–5.90)
RDW: 16.1 % — ABNORMAL HIGH (ref 11.5–14.5)
WBC: 9.7 10*3/uL (ref 3.8–10.6)

## 2018-03-05 MED ORDER — SODIUM CHLORIDE 0.9% FLUSH
10.0000 mL | INTRAVENOUS | Status: DC | PRN
  Administered 2018-03-05: 10 mL via INTRAVENOUS
  Filled 2018-03-05: qty 10

## 2018-03-05 MED ORDER — SODIUM CHLORIDE 0.9 % IV SOLN
1000.0000 mg | Freq: Once | INTRAVENOUS | Status: AC
  Administered 2018-03-05: 1000 mg via INTRAVENOUS
  Filled 2018-03-05: qty 20

## 2018-03-05 MED ORDER — SODIUM CHLORIDE 0.9 % IV SOLN
Freq: Once | INTRAVENOUS | Status: AC
  Administered 2018-03-05: 11:00:00 via INTRAVENOUS
  Filled 2018-03-05: qty 250

## 2018-03-05 MED ORDER — PANTOPRAZOLE SODIUM 40 MG PO TBEC: 40.0000 mg | DELAYED_RELEASE_TABLET | Freq: Every day | ORAL | 1 refills | Status: DC

## 2018-03-05 MED ORDER — HEPARIN SOD (PORK) LOCK FLUSH 100 UNIT/ML IV SOLN
500.0000 [IU] | Freq: Once | INTRAVENOUS | Status: AC
  Administered 2018-03-05: 500 [IU] via INTRAVENOUS

## 2018-03-05 MED ORDER — HEPARIN SOD (PORK) LOCK FLUSH 100 UNIT/ML IV SOLN: 500.0000 [IU] | Freq: Once | INTRAVENOUS | Status: DC | PRN

## 2018-03-05 NOTE — Progress Notes (Signed)
Hematology/Oncology Follow Up Note [facility] [phone] [phone]   Patient Care Team: [name], MD as PCP - General (Family Medicine) [name], [name], MD (General Surgery) [name], MD as Consulting Physician (Oncology) [name], RN as Registered Nurse  Reason for Visit:  Follow up for lung cancer, immunotherapy side effects, adrenal insufficiency.  HISTORY OF PRESENTING ILLNESS:  Marc Gray 72 y.o.  male who presents for treatment of Stage IIIB (cT2b, cN3, cM0)  Adenocarcinoma of lung.  # Patient is s/p [place] and taxol concurrent RT for treatment of Stage IIIB Lung cancer. He recieved additional lung boost RT Currently on Durvalumab maintenance.   # Colon CA was listed in his history, however reviewing his surgical pathology from colonoscopy on 04/15/2014, during which he had polyps removed and all are negative for cancer.patient is scheduled for colonoscopy screening this year.  # 5/1/ 2019 CT scan was discussed at tumor board and changes consistent with radiation changes  INTERVAL HISTORY Patient he presents for follow-up of for assessment prior to immunotherapy maintenance for stage IIIb adenocarcinoma of lung. #Adrenal insufficiency,follows up with endocrinology.He ran out of prednisone at last visit and did not take for a week. BP was borderline, and  I gave him a prescription of 10 mg of prednisone in the morning and 5 mg prednisone in the evening. He now takes prednisone and BP is stable today.  #Acquired hypothyroidism, most recent TSH 7.1..  On Synthroid 150 MCG daily.  Follow up with Endocrinology.  #Shortness of breath, stable.  He does not exercise much. Has gained 8 pounds during the interval.   Review of Systems  Constitutional: Positive for fatigue. Negative for appetite change, chills, diaphoresis, fever and unexpected weight change.  HENT:   Negative for hearing loss, lump/mass, mouth  sores, nosebleeds and sore throat.   Eyes: Negative for eye problems and icterus.  Respiratory: Positive for shortness of breath. Negative for chest tightness, cough, hemoptysis and wheezing.        Chronic SOB with exertion.   Cardiovascular: Negative for chest pain, leg swelling and palpitations.  Gastrointestinal: Negative for abdominal distention, abdominal pain, blood in stool, diarrhea, nausea, rectal pain and vomiting.  Endocrine: Negative for hot flashes.  Genitourinary: Negative for bladder incontinence, difficulty urinating, dysuria, frequency, hematuria and nocturia.   Musculoskeletal: Negative for arthralgias, back pain, flank pain, gait problem and myalgias.  Skin: Negative for itching and rash.  Neurological: Negative for dizziness, gait problem, headaches, light-headedness, numbness and seizures.  Hematological: Negative for adenopathy. Does not bruise/bleed easily.  Psychiatric/Behavioral: Negative for confusion, decreased concentration and depression. The patient is not nervous/anxious.     MEDICAL HISTORY:  Past Medical History:  Diagnosis Date  . Anxiety   . Arthritis    SHOULDER  . Cataract   . Colon cancer ([place]) 2015   Pt states during his colonoscopy he had cancerous polyps removed.   . Depression    SINCE DIAGNOSIS  . Dyspnea    DOE  . Enlarged prostate   . Hypertension   . Lung cancer [facility])    Aug 2018  . Pain    DUE TO LUNG ISSUE    SURGICAL HISTORY: Past Surgical History:  Procedure Laterality Date  . CATARACT EXTRACTION W/ INTRAOCULAR LENS  IMPLANT, BILATERAL    . COLONOSCOPY    . EYE SURGERY    . LYMPH GLAND EXCISION N/A 02/09/2017   Procedure: CERVICAL LYMPH NODE BIOPSY;  Surgeon: Robert Bellow,  MD;  Location: [facility] ORS;  Service: General;  Laterality: N/A;  . PORTACATH PLACEMENT Right 02/23/2017   Procedure: INSERTION PORT-A-CATH;  Surgeon: Robert Bellow, MD;  Location: [facility] ORS;  Service: General;  Laterality: Right;  . PROSTATE  SURGERY    . TONSILLECTOMY      SOCIAL HISTORY: Social History   Socioeconomic History  . Marital status: Married    Spouse name: Not on file  . Number of children: Not on file  . Years of education: Not on file  . Highest education level: Not on file  Occupational History  . Not on file  Social Needs  . Financial resource strain: Not on file  . Food insecurity:    Worry: Not on file    Inability: Not on file  . Transportation needs:    Medical: Not on file    Non-medical: Not on file  Tobacco Use  . Smoking status: Former Smoker    Packs/day: 1.00    Years: 53.00    Pack years: 53.00    Types: Cigarettes    Last attempt to quit: 04/14/2017    Years since quitting: 0.8  . Smokeless tobacco: Never Used  . Tobacco comment: Quit November 2018  Substance and Sexual Activity  . Alcohol use: Yes    Comment: occassional  . Drug use: No  . Sexual activity: Yes  Lifestyle  . Physical activity:    Days per week: Not on file    Minutes per session: Not on file  . Stress: Not on file  Relationships  . Social connections:    Talks on phone: Not on file    Gets together: Not on file    Attends religious service: Not on file    Active member of club or organization: Not on file    Attends meetings of clubs or organizations: Not on file    Relationship status: Not on file  . Intimate partner violence:    Fear of current or ex partner: Not on file    Emotionally abused: Not on file    Physically abused: Not on file    Forced sexual activity: Not on file  Other Topics Concern  . Not on file  Social History Narrative  . Not on file    FAMILY HISTORY: Family History  Problem Relation Age of Onset  . Breast cancer Maternal Grandmother   . Dementia Mother   . Diabetes Father   . Stroke Father     ALLERGIES:  has No Known Allergies.  MEDICATIONS:  Current Outpatient Medications  Medication Sig Dispense Refill  . aspirin 325 MG tablet Take 650 mg by mouth daily.      . budesonide-formoterol (SYMBICORT) 160-4.5 MCG/ACT inhaler Inhale into the lungs. Inhale 2 inhalations into the lungs 2 times daily    . calcium-vitamin D (OSCAL WITH D) 500-200 MG-UNIT tablet Take 1 tablet by mouth daily. 90 tablet 1  . Chlorpheniramine-Phenylephrine (SINUS & ALLERGY) 4-10 MG tablet Take 1 tablet by mouth daily as needed.     . diphenhydrAMINE-zinc acetate (BENADRYL EXTRA STRENGTH) cream Apply 1 application topically 3 (three) times daily as needed for itching. 28 g 0  . fluticasone (FLONASE) 50 MCG/ACT nasal spray Place 1 spray into both nostrils daily. 16 g 2  . levothyroxine (SYNTHROID, LEVOTHROID) 150 MCG tablet Take by mouth. Take 1 tablet (150 mcg total) by mouth once daily Take on an empty stomach with a glass of water at least 30-60 minutes before breakfast.    .  lidocaine-prilocaine (EMLA) cream Apply over port 1-2 hours prior to chemotherapy treatment.  Cover with plastic wrap. 30 g 1  . Melatonin 10 MG TABS Take 10 mg by mouth at bedtime as needed.    . Multiple Vitamin (MULTI-VITAMINS) TABS Take 1 tablet by mouth daily.     . ondansetron (ZOFRAN) 8 MG tablet Take 1 tablet (8 mg total) by mouth every 8 (eight) hours as needed for nausea or vomiting. 60 tablet 1  . pantoprazole (PROTONIX) 40 MG tablet Take 1 tablet (40 mg total) by mouth daily. 30 tablet 2  . predniSONE (DELTASONE) 10 MG tablet Take one tablet each morning. Take one-half tablet each afternoon (take between 2 - 5 PM) 45 tablet 0  . PROAIR HFA 108 (90 Base) MCG/ACT inhaler INHALE 2 PUFFS BY MOUTH EVERY 6 HOURS AS NEEDED 8.5 g 0  . prochlorperazine (COMPAZINE) 10 MG tablet Take 1 tablet (10 mg total) by mouth every 6 (six) hours as needed for nausea or vomiting. 60 tablet 2  . tiotropium (SPIRIVA HANDIHALER) 18 MCG inhalation capsule Place 1 capsule (18 mcg total) into inhaler and inhale daily. 30 capsule 1  . triamcinolone ointment (KENALOG) 0.5 % Apply 1 application topically 3 (three) times daily as  needed. 30 g 0   No current facility-administered medications for this visit.    Facility-Administered Medications Ordered in Other Visits  Medication Dose Route Frequency Provider Last Rate Last Dose  . sodium chloride flush (NS) 0.9 % injection 10 mL  10 mL Intravenous PRN [name], MD   10 mL at 04/03/17 7096      .  PHYSICAL EXAMINATION: ECOG PERFORMANCE STATUS: 1 - Symptomatic but completely ambulatory Vitals:   03/05/18 1057  BP: 138/86  Pulse: 78  Resp: 16  Temp: (!) 96.3 F (35.7 C)  SpO2: 96%   Filed Weights   03/05/18 1057  Weight: 219 lb (99.3 kg)  Physical Exam  Constitutional: He is oriented to person, place, and time. No distress.  HENT:  Head: Normocephalic and atraumatic.  Right Ear: External ear normal.  Left Ear: External ear normal.  Nose: Nose normal.  Mouth/Throat: Oropharynx is clear and moist. No oropharyngeal exudate.  Eyes: Pupils are equal, round, and reactive to light. EOM are normal. Right eye exhibits no discharge. Left eye exhibits no discharge. No scleral icterus.  Neck: Normal range of motion. Neck supple. No JVD present.  Cardiovascular: Normal rate, regular rhythm and normal heart sounds.  No murmur heard. Pulmonary/Chest: Effort normal. No stridor. No respiratory distress. He has no wheezes. He has no rales. He exhibits no tenderness.  Decreased sound bilaterally lower lobe.   Abdominal: Soft. Bowel sounds are normal. He exhibits no distension and no mass. There is no tenderness. There is no rebound.  Musculoskeletal: Normal range of motion. He exhibits no edema or tenderness.  Lymphadenopathy:    He has no cervical adenopathy.  Neurological: He is alert and oriented to person, place, and time. No cranial nerve deficit. He exhibits normal muscle tone. Coordination normal.  Skin: Skin is warm and dry. No rash noted. He is not diaphoretic. No erythema.  Psychiatric: Memory, affect and judgment normal.   LABORATORY DATA:  I have  reviewed the data as listed Lab Results  Component Value Date   WBC 9.7 03/05/2018   HGB 13.1 03/05/2018   HCT 38.9 (L) 03/05/2018   MCV 92.7 03/05/2018   PLT 189 03/05/2018   Recent Labs    02/05/18 0841 02/19/18  0831 03/05/18 0927  NA 136 137 140  K 3.5 4.1 3.7  CL 104 105 105  CO2 24 23 26   GLUCOSE 138* 106* 125*  BUN 26* 21 25*  CREATININE 1.41* 1.73* 1.48*  CALCIUM 8.7* 9.0 9.1  GFRNONAA 49* 38* 46*  GFRAA 56* 44* 53*  PROT 6.5 6.6 6.5  ALBUMIN 3.4* 3.4* 3.7  AST 24 25 22   ALT 22 21 20   ALKPHOS 48 46 51  BILITOT 0.5 0.8 0.6   RADIOGRAPHIC STUDIES: I have personally reviewed the radiological images as listed and agreed with the findings in the report.  12/17/2017  CAT scan was independently reviewed and discussed with patient.  Image showed a stable masslike consolidation in the left upper lobe with evolving radiation therapy changes in the medial left hemithorax.  Subpleural right upper lobe nodule is unchanged.  Small /pericardial effusion, slightly increased from prior. Patient had a new T2 superior endplate compression fracture. Aortic atherosclerosis, three-vessel coronary artery calcification.  Ascending aortic aneurysm.  Emphysema.  12/31/2017   ASSESSMENT & PLAN:  Cancer Staging Adenocarcinoma of left lung, stage 3 (HCC) Staging form: Lung, AJCC 8th Edition - Clinical stage from 02/14/2017: Stage IIIB (cT2b, cN3, cM0) - Signed by [name], MD on 02/14/2017  1. Adenocarcinoma of left lung, stage 3 ([place])   2. Hypothyroidism due to medication   3. Adrenal insufficiency ([place])   4. Encounter for antineoplastic immunotherapy   5. Anemia in stage 3 chronic kidney disease ([place])   6. Weight gain due to medication    #Stage IIIB lung adenocarcinoma,  Labs are reviewed. Counts are acceptable to proceed with today's Durvaluamab. Stable disease.  Obtain CT chest wo before next treatment.   #Adrenal insufficiency, continue sterids. Follow up with endocrinology.    #Hypothyroidism,conitnue Synthroid 150 MCG daily. Follow up with Dr.Solum.  . 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  Bone density scan was independently reviewed and reveals normal bone density.  Continue calcium and vitamin D supplement.  # Anemia in CKD, creatinine stable.  # Weight gain, due to chronic steroid use. discussed with patient about exercise and healthy diet.   We spent sufficient time to discuss many aspect of care, questions were answered to patient's satisfaction. Follow up in 2 weeks. Total face to face encounter time for this patient visit was 25 min. >50% of the time was  spent in counseling and coordination of care.  [name], MD, PhD Hematology Oncology [facility] Pager- [phone] 03/05/2018

## 2018-03-11 DIAGNOSIS — E039 Hypothyroidism, unspecified: Secondary | ICD-10-CM | POA: Diagnosis not present

## 2018-03-11 DIAGNOSIS — E2749 Other adrenocortical insufficiency: Secondary | ICD-10-CM | POA: Diagnosis not present

## 2018-03-15 ENCOUNTER — Ambulatory Visit
Admission: RE | Admit: 2018-03-15 | Discharge: 2018-03-15 | Disposition: A | Discharge status: Home / Self Care | Payer: PPO | Source: Ambulatory Visit | Attending: Oncology | Admitting: Oncology

## 2018-03-15 DIAGNOSIS — C3490 Malignant neoplasm of unspecified part of unspecified bronchus or lung: Secondary | ICD-10-CM | POA: Diagnosis not present

## 2018-03-15 DIAGNOSIS — J701 Chronic and other pulmonary manifestations due to radiation: Secondary | ICD-10-CM | POA: Insufficient documentation

## 2018-03-15 DIAGNOSIS — C3492 Malignant neoplasm of unspecified part of left bronchus or lung: Secondary | ICD-10-CM | POA: Insufficient documentation

## 2018-03-15 DIAGNOSIS — J439 Emphysema, unspecified: Secondary | ICD-10-CM | POA: Insufficient documentation

## 2018-03-15 DIAGNOSIS — I700 Atherosclerosis of aorta: Secondary | ICD-10-CM | POA: Insufficient documentation

## 2018-03-15 DIAGNOSIS — E039 Hypothyroidism, unspecified: Secondary | ICD-10-CM | POA: Diagnosis not present

## 2018-03-15 DIAGNOSIS — E2749 Other adrenocortical insufficiency: Secondary | ICD-10-CM | POA: Diagnosis not present

## 2018-03-19 ENCOUNTER — Inpatient Hospital Stay: Payer: PPO

## 2018-03-19 ENCOUNTER — Encounter: Payer: Self-pay | Admitting: Oncology

## 2018-03-19 ENCOUNTER — Encounter: Payer: Self-pay | Admitting: *Deleted

## 2018-03-19 ENCOUNTER — Other Ambulatory Visit: Payer: Self-pay

## 2018-03-19 ENCOUNTER — Other Ambulatory Visit: Payer: Self-pay | Admitting: Oncology

## 2018-03-19 ENCOUNTER — Inpatient Hospital Stay (HOSPITAL_BASED_OUTPATIENT_CLINIC_OR_DEPARTMENT_OTHER): Payer: PPO | Admitting: Oncology

## 2018-03-19 VITALS — BP 123/77 | HR 72 | Temp 96.6°F | Resp 18 | Wt 222.4 lb

## 2018-03-19 DIAGNOSIS — I129 Hypertensive chronic kidney disease with stage 1 through stage 4 chronic kidney disease, or unspecified chronic kidney disease: Secondary | ICD-10-CM

## 2018-03-19 DIAGNOSIS — T50905A Adverse effect of unspecified drugs, medicaments and biological substances, initial encounter: Secondary | ICD-10-CM

## 2018-03-19 DIAGNOSIS — C3492 Malignant neoplasm of unspecified part of left bronchus or lung: Secondary | ICD-10-CM

## 2018-03-19 DIAGNOSIS — Z923 Personal history of irradiation: Secondary | ICD-10-CM

## 2018-03-19 DIAGNOSIS — R5383 Other fatigue: Secondary | ICD-10-CM

## 2018-03-19 DIAGNOSIS — D631 Anemia in chronic kidney disease: Secondary | ICD-10-CM

## 2018-03-19 DIAGNOSIS — E2740 Unspecified adrenocortical insufficiency: Secondary | ICD-10-CM

## 2018-03-19 DIAGNOSIS — R635 Abnormal weight gain: Secondary | ICD-10-CM

## 2018-03-19 DIAGNOSIS — R0602 Shortness of breath: Secondary | ICD-10-CM

## 2018-03-19 DIAGNOSIS — E032 Hypothyroidism due to medicaments and other exogenous substances: Secondary | ICD-10-CM

## 2018-03-19 DIAGNOSIS — I700 Atherosclerosis of aorta: Secondary | ICD-10-CM

## 2018-03-19 DIAGNOSIS — F418 Other specified anxiety disorders: Secondary | ICD-10-CM

## 2018-03-19 DIAGNOSIS — Z7982 Long term (current) use of aspirin: Secondary | ICD-10-CM

## 2018-03-19 DIAGNOSIS — Z5112 Encounter for antineoplastic immunotherapy: Secondary | ICD-10-CM | POA: Diagnosis not present

## 2018-03-19 DIAGNOSIS — Z7952 Long term (current) use of systemic steroids: Secondary | ICD-10-CM

## 2018-03-19 DIAGNOSIS — Z9221 Personal history of antineoplastic chemotherapy: Secondary | ICD-10-CM | POA: Diagnosis not present

## 2018-03-19 DIAGNOSIS — J439 Emphysema, unspecified: Secondary | ICD-10-CM

## 2018-03-19 DIAGNOSIS — R338 Other retention of urine: Secondary | ICD-10-CM

## 2018-03-19 DIAGNOSIS — M1990 Unspecified osteoarthritis, unspecified site: Secondary | ICD-10-CM

## 2018-03-19 DIAGNOSIS — N183 Chronic kidney disease, stage 3 unspecified: Secondary | ICD-10-CM

## 2018-03-19 DIAGNOSIS — R3914 Feeling of incomplete bladder emptying: Secondary | ICD-10-CM

## 2018-03-19 DIAGNOSIS — Z87891 Personal history of nicotine dependence: Secondary | ICD-10-CM

## 2018-03-19 DIAGNOSIS — Z79899 Other long term (current) drug therapy: Secondary | ICD-10-CM

## 2018-03-19 DIAGNOSIS — T380X5A Adverse effect of glucocorticoids and synthetic analogues, initial encounter: Secondary | ICD-10-CM

## 2018-03-19 DIAGNOSIS — C770 Secondary and unspecified malignant neoplasm of lymph nodes of head, face and neck: Secondary | ICD-10-CM

## 2018-03-19 DIAGNOSIS — S22000A Wedge compression fracture of unspecified thoracic vertebra, initial encounter for closed fracture: Secondary | ICD-10-CM

## 2018-03-19 DIAGNOSIS — N401 Enlarged prostate with lower urinary tract symptoms: Secondary | ICD-10-CM

## 2018-03-19 LAB — CBC WITH DIFFERENTIAL/PLATELET
Abs Immature Granulocytes: 0.07 10*3/uL (ref 0.00–0.07)
Basophils Absolute: 0 10*3/uL (ref 0.0–0.1)
Basophils Relative: 1 %
EOS ABS: 0.2 10*3/uL (ref 0.0–0.5)
Eosinophils Relative: 3 %
HEMATOCRIT: 39 % (ref 39.0–52.0)
Hemoglobin: 12.5 g/dL — ABNORMAL LOW (ref 13.0–17.0)
Immature Granulocytes: 1 %
LYMPHS ABS: 1.3 10*3/uL (ref 0.7–4.0)
Lymphocytes Relative: 16 %
MCH: 30.2 pg (ref 26.0–34.0)
MCHC: 32.1 g/dL (ref 30.0–36.0)
MCV: 94.2 fL (ref 80.0–100.0)
MONOS PCT: 11 %
Monocytes Absolute: 0.9 10*3/uL (ref 0.1–1.0)
Neutro Abs: 5.5 10*3/uL (ref 1.7–7.7)
Neutrophils Relative %: 68 %
Platelets: 183 10*3/uL (ref 150–400)
RBC: 4.14 MIL/uL — ABNORMAL LOW (ref 4.22–5.81)
RDW: 15.1 % (ref 11.5–15.5)
WBC: 8 10*3/uL (ref 4.0–10.5)
nRBC: 0 % (ref 0.0–0.2)

## 2018-03-19 LAB — COMPREHENSIVE METABOLIC PANEL
ALBUMIN: 3.6 g/dL (ref 3.5–5.0)
ALK PHOS: 51 U/L (ref 38–126)
ALT: 19 U/L (ref 0–44)
AST: 20 U/L (ref 15–41)
Anion gap: 8 (ref 5–15)
BUN: 28 mg/dL — ABNORMAL HIGH (ref 8–23)
CALCIUM: 9 mg/dL (ref 8.9–10.3)
CO2: 27 mmol/L (ref 22–32)
CREATININE: 1.37 mg/dL — AB (ref 0.61–1.24)
Chloride: 105 mmol/L (ref 98–111)
GFR calc Af Amer: 58 mL/min — ABNORMAL LOW (ref >60)
GFR calc non Af Amer: 50 mL/min — ABNORMAL LOW (ref >60)
Glucose, Bld: 108 mg/dL — ABNORMAL HIGH (ref 70–99)
Potassium: 3.4 mmol/L — ABNORMAL LOW (ref 3.5–5.1)
SODIUM: 140 mmol/L (ref 135–145)
Total Bilirubin: 0.7 mg/dL (ref 0.3–1.2)
Total Protein: 6.4 g/dL — ABNORMAL LOW (ref 6.5–8.1)

## 2018-03-19 LAB — TSH: TSH: 4.858 u[IU]/mL — ABNORMAL HIGH (ref 0.350–4.500)

## 2018-03-19 MED ORDER — SODIUM CHLORIDE 0.9 % IV SOLN
Freq: Once | INTRAVENOUS | Status: AC
  Administered 2018-03-19: 09:00:00 via INTRAVENOUS
  Filled 2018-03-19: qty 250

## 2018-03-19 MED ORDER — HEPARIN SOD (PORK) LOCK FLUSH 100 UNIT/ML IV SOLN
500.0000 [IU] | Freq: Once | INTRAVENOUS | Status: AC | PRN
  Administered 2018-03-19: 500 [IU]
  Filled 2018-03-19: qty 5

## 2018-03-19 MED ORDER — SODIUM CHLORIDE 0.9 % IV SOLN
1000.0000 mg | Freq: Once | INTRAVENOUS | Status: AC
  Administered 2018-03-19: 1000 mg via INTRAVENOUS
  Filled 2018-03-19: qty 20

## 2018-03-19 MED ORDER — SODIUM CHLORIDE 0.9% FLUSH
10.0000 mL | INTRAVENOUS | Status: DC | PRN
  Administered 2018-03-19: 10 mL
  Filled 2018-03-19: qty 10

## 2018-03-19 MED ORDER — TAMSULOSIN HCL 0.4 MG PO CAPS: 0.4000 mg | ORAL_CAPSULE | Freq: Every day | ORAL | 0 refills | Status: DC

## 2018-03-19 NOTE — Progress Notes (Signed)
Hematology/Oncology Follow Up Note [facility] [phone] [phone]   Patient Care Team: [name], MD as PCP - General (Family Medicine) [name], [name], MD (General Surgery) [name], MD as Consulting Physician (Oncology) [name], RN as Registered Nurse  Reason for Visit:  Follow up for lung cancer, immunotherapy side effects, adrenal insufficiency.  HISTORY OF PRESENTING ILLNESS:  Marc Gray 72 y.o.  male who presents for treatment of Stage IIIB (cT2b, cN3, cM0)  Adenocarcinoma of lung.  # Patient is s/p [place] and taxol concurrent RT for treatment of Stage IIIB Lung cancer. He recieved additional lung boost RT Currently on Durvalumab maintenance.   # Colon CA was listed in his history, however reviewing his surgical pathology from colonoscopy on 04/15/2014, during which he had polyps removed and all are negative for cancer.patient is scheduled for colonoscopy screening this year.  # 5/1/ 2019 CT scan was discussed at tumor board and changes consistent with radiation changes  INTERVAL HISTORY Patient he presents for follow-up of for assessment prior to immunotherapy maintenance for stage IIIb adenocarcinoma of lung.  #Adrenal insufficiency follows up with endocrinology.  Currently on prednisone 10 mg in the a.m. and 5 mg the evening. Blood pressure has been stable.  #Acquired hypothyroidism, TSH improved.  On Synthroid 150 MCG daily.  Follow-up with endocrinology. #Weight gain due to corticosteroids.  Gained 3 pounds during interval. #Chronic shortness of breath, stable. #Patient reports history of enlarged prostate, recently having sensation of increased frequency of urination and sensation of not emptying bladder at one time.  He is concerned.  Denies any hematuria, dysuria.  Review of Systems  Constitutional: Positive for fatigue. Negative for appetite change, chills, diaphoresis, fever and unexpected  weight change.  HENT:   Negative for hearing loss, lump/mass, mouth sores, nosebleeds and sore throat.   Eyes: Negative for eye problems and icterus.  Respiratory: Positive for shortness of breath. Negative for chest tightness, cough, hemoptysis and wheezing.        Chronic SOB with exertion.   Cardiovascular: Negative for chest pain, leg swelling and palpitations.  Gastrointestinal: Negative for abdominal distention, abdominal pain, blood in stool, diarrhea, nausea, rectal pain and vomiting.  Endocrine: Negative for hot flashes.  Genitourinary: Negative for bladder incontinence, difficulty urinating, dysuria, frequency, hematuria and nocturia.   Musculoskeletal: Negative for arthralgias, back pain, flank pain, gait problem and myalgias.  Skin: Negative for itching and rash.  Neurological: Negative for dizziness, gait problem, headaches, light-headedness, numbness and seizures.  Hematological: Negative for adenopathy. Does not bruise/bleed easily.  Psychiatric/Behavioral: Negative for confusion, decreased concentration and depression. The patient is not nervous/anxious.     MEDICAL HISTORY:  Past Medical History:  Diagnosis Date  . Anxiety   . Arthritis    SHOULDER  . Cataract   . Colon cancer ([place]) 2015   Pt states during his colonoscopy he had cancerous polyps removed.   . Depression    SINCE DIAGNOSIS  . Dyspnea    DOE  . Enlarged prostate   . Hypertension   . Lung cancer [facility])    Aug 2018  . Pain    DUE TO LUNG ISSUE    SURGICAL HISTORY: Past Surgical History:  Procedure Laterality Date  . CATARACT EXTRACTION W/ INTRAOCULAR LENS  IMPLANT, BILATERAL    . COLONOSCOPY    . EYE SURGERY    . LYMPH GLAND EXCISION N/A 02/09/2017   Procedure: CERVICAL LYMPH NODE BIOPSY;  Surgeon: Robert Bellow, MD;  Location: [facility] ORS;  Service: General;  Laterality: N/A;  . PORTACATH PLACEMENT Right 02/23/2017   Procedure: INSERTION PORT-A-CATH;  Surgeon: Robert Bellow, MD;   Location: [facility] ORS;  Service: General;  Laterality: Right;  . PROSTATE SURGERY    . TONSILLECTOMY      SOCIAL HISTORY: Social History   Socioeconomic History  . Marital status: Married    Spouse name: Not on file  . Number of children: Not on file  . Years of education: Not on file  . Highest education level: Not on file  Occupational History  . Not on file  Social Needs  . Financial resource strain: Not on file  . Food insecurity:    Worry: Not on file    Inability: Not on file  . Transportation needs:    Medical: Not on file    Non-medical: Not on file  Tobacco Use  . Smoking status: Former Smoker    Packs/day: 1.00    Years: 53.00    Pack years: 53.00    Types: Cigarettes    Last attempt to quit: 04/14/2017    Years since quitting: 0.9  . Smokeless tobacco: Never Used  . Tobacco comment: Quit November 2018  Substance and Sexual Activity  . Alcohol use: Yes    Comment: occassional  . Drug use: No  . Sexual activity: Yes  Lifestyle  . Physical activity:    Days per week: Not on file    Minutes per session: Not on file  . Stress: Not on file  Relationships  . Social connections:    Talks on phone: Not on file    Gets together: Not on file    Attends religious service: Not on file    Active member of club or organization: Not on file    Attends meetings of clubs or organizations: Not on file    Relationship status: Not on file  . Intimate partner violence:    Fear of current or ex partner: Not on file    Emotionally abused: Not on file    Physically abused: Not on file    Forced sexual activity: Not on file  Other Topics Concern  . Not on file  Social History Narrative  . Not on file    FAMILY HISTORY: Family History  Problem Relation Age of Onset  . Breast cancer Maternal Grandmother   . Dementia Mother   . Diabetes Father   . Stroke Father     ALLERGIES:  has No Known Allergies.  MEDICATIONS:  Current Outpatient Medications  Medication Sig  Dispense Refill  . aspirin 325 MG tablet Take 650 mg by mouth daily.     . budesonide-formoterol (SYMBICORT) 160-4.5 MCG/ACT inhaler Inhale into the lungs. Inhale 2 inhalations into the lungs 2 times daily    . calcium-vitamin D (OSCAL WITH D) 500-200 MG-UNIT tablet Take 1 tablet by mouth daily. 90 tablet 1  . Chlorpheniramine-Phenylephrine (SINUS & ALLERGY) 4-10 MG tablet Take 1 tablet by mouth daily as needed.     . diphenhydrAMINE-zinc acetate (BENADRYL EXTRA STRENGTH) cream Apply 1 application topically 3 (three) times daily as needed for itching. 28 g 0  . fluticasone (FLONASE) 50 MCG/ACT nasal spray Place 1 spray into both nostrils daily. 16 g 2  . levothyroxine (SYNTHROID, LEVOTHROID) 150 MCG tablet Take by mouth. Take 1 tablet (150 mcg total) by mouth once daily Take on an empty stomach with a glass of water at least 30-60 minutes before breakfast.    .  lidocaine-prilocaine (EMLA) cream Apply over port 1-2 hours prior to chemotherapy treatment.  Cover with plastic wrap. 30 g 1  . Melatonin 10 MG TABS Take 10 mg by mouth at bedtime as needed.    . Multiple Vitamin (MULTI-VITAMINS) TABS Take 1 tablet by mouth daily.     . ondansetron (ZOFRAN) 8 MG tablet Take 1 tablet (8 mg total) by mouth every 8 (eight) hours as needed for nausea or vomiting. 60 tablet 1  . pantoprazole (PROTONIX) 40 MG tablet Take 1 tablet (40 mg total) by mouth daily. 90 tablet 1  . predniSONE (DELTASONE) 10 MG tablet Take one tablet each morning. Take one-half tablet each afternoon (take between 2 - 5 PM) 45 tablet 0  . PROAIR HFA 108 (90 Base) MCG/ACT inhaler INHALE 2 PUFFS BY MOUTH EVERY 6 HOURS AS NEEDED 8.5 g 0  . prochlorperazine (COMPAZINE) 10 MG tablet Take 1 tablet (10 mg total) by mouth every 6 (six) hours as needed for nausea or vomiting. 60 tablet 2  . tiotropium (SPIRIVA HANDIHALER) 18 MCG inhalation capsule Place 1 capsule (18 mcg total) into inhaler and inhale daily. 30 capsule 1  . triamcinolone ointment  (KENALOG) 0.5 % Apply 1 application topically 3 (three) times daily as needed. 30 g 0  . tamsulosin (FLOMAX) 0.4 MG CAPS capsule TAKE 1 CAPSULE(0.4 MG) BY MOUTH DAILY 90 capsule 0   No current facility-administered medications for this visit.    Facility-Administered Medications Ordered in Other Visits  Medication Dose Route Frequency Provider Last Rate Last Dose  . sodium chloride flush (NS) 0.9 % injection 10 mL  10 mL Intravenous PRN [name], MD   10 mL at 04/03/17 5400      .  PHYSICAL EXAMINATION: ECOG PERFORMANCE STATUS: 1 - Symptomatic but completely ambulatory Vitals:   03/19/18 0839  BP: 123/77  Pulse: 72  Resp: 18  Temp: (!) 96.6 F (35.9 C)  SpO2: 96%   Filed Weights   03/19/18 0839  Weight: 222 lb 6.4 oz (100.9 kg)  Physical Exam  Constitutional: He is oriented to person, place, and time. No distress.  HENT:  Head: Normocephalic and atraumatic.  Right Ear: External ear normal.  Left Ear: External ear normal.  Nose: Nose normal.  Mouth/Throat: Oropharynx is clear and moist. No oropharyngeal exudate.  Eyes: Pupils are equal, round, and reactive to light. EOM are normal. Right eye exhibits no discharge. Left eye exhibits no discharge. No scleral icterus.  Neck: Normal range of motion. Neck supple. No JVD present.  Cardiovascular: Normal rate, regular rhythm and normal heart sounds.  No murmur heard. Pulmonary/Chest: Effort normal. No stridor. No respiratory distress. He has no wheezes. He has no rales. He exhibits no tenderness.  Decreased sound bilaterally lower lobe.   Abdominal: Soft. Bowel sounds are normal. He exhibits no distension and no mass. There is no tenderness. There is no rebound.  Musculoskeletal: Normal range of motion. He exhibits no edema or tenderness.  Lymphadenopathy:    He has no cervical adenopathy.  Neurological: He is alert and oriented to person, place, and time. No cranial nerve deficit. He exhibits normal muscle tone. Coordination  normal.  Skin: Skin is warm and dry. No rash noted. He is not diaphoretic. No erythema.  Psychiatric: Memory, affect and judgment normal.   LABORATORY DATA:  I have reviewed the data as listed Lab Results  Component Value Date   WBC 8.0 03/19/2018   HGB 12.5 (L) 03/19/2018   HCT 39.0 03/19/2018  MCV 94.2 03/19/2018   PLT 183 03/19/2018   Recent Labs    02/19/18 0831 03/05/18 0927 03/19/18 0811  NA 137 140 140  K 4.1 3.7 3.4*  CL 105 105 105  CO2 23 26 27   GLUCOSE 106* 125* 108*  BUN 21 25* 28*  CREATININE 1.73* 1.48* 1.37*  CALCIUM 9.0 9.1 9.0  GFRNONAA 38* 46* 50*  GFRAA 44* 53* 58*  PROT 6.6 6.5 6.4*  ALBUMIN 3.4* 3.7 3.6  AST 25 22 20   ALT 21 20 19   ALKPHOS 46 51 51  BILITOT 0.8 0.6 0.7   RADIOGRAPHIC STUDIES: I have personally reviewed the radiological images as listed and agreed with the findings in the report.  12/17/2017  CAT scan was independently reviewed and discussed with patient.  Image showed a stable masslike consolidation in the left upper lobe with evolving radiation therapy changes in the medial left hemithorax.  Subpleural right upper lobe nodule is unchanged.  Small /pericardial effusion, slightly increased from prior. Patient had a new T2 superior endplate compression fracture. Aortic atherosclerosis, three-vessel coronary artery calcification.  Ascending aortic aneurysm.  Emphysema.  12/31/2017   ASSESSMENT & PLAN:  Cancer Staging Adenocarcinoma of left lung, stage 3 (HCC) Staging form: Lung, AJCC 8th Edition - Clinical stage from 02/14/2017: Stage IIIB (cT2b, cN3, cM0) - Signed by [name], MD on 02/14/2017  1. Adenocarcinoma of left lung, stage 3 ([place])   2. Hypothyroidism due to medication   3. Adrenal insufficiency ([place])   4. Encounter for antineoplastic immunotherapy   5. Anemia in stage 3 chronic kidney disease ([place])   6. Weight gain due to medication   7. Compression fracture of body of thoracic vertebra (HCC)   8. Benign prostatic  hyperplasia with incomplete bladder emptying    #Stage IIIB lung adenocarcinoma,  CT chest without contrast 03/15/2018 showed nonspecific findings identified highly worrisome for recurrent tumor or metastatic disease. Posttreatment changes within the left lobe.  Stable. 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 Aortic atherosclerosis and emphysema. Multivessel coronary artery atherosclerotic calcifications.  Labs are reviewed and the counts are acceptable to proceed with today's durvalumab treatment. He has about 6 more treatment to complete total of 26 durvalumab treatment.  #Adrenal insufficiency, continue prednisone 10 mg in the morning and 5 mg in evening.  Follow-up with endocrinology. #Hypothyroidism,conitnue synthrod 150 MCG daily. Follow up with Dr.Solum.  . 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  Bone density scan was independently reviewed and reveals normal bone density.  Continue calcium and vitamin D supplement.  # Anemia in CKD, creatinine stable.  # Weight gain, continue to have weight gain due to steroid use. Discussed about life style changes. .  Multivessel coronary atherosclerosis. Advise patient to discuss with primary care physician and cardiology.   # BPH, will start patient on Flomax for his BPH symptoms. Encourage patient to discuss with primary care physician.   We spent sufficient time to discuss many aspect of care, questions were answered to patient's satisfaction. Follow up in 2 weeks.    [name], MD, PhD Hematology Oncology [facility] Pager- [phone] 03/19/2018

## 2018-03-19 NOTE — Progress Notes (Signed)
Patient here for follow up. No concerns voiced.  °

## 2018-04-02 ENCOUNTER — Inpatient Hospital Stay: Payer: PPO

## 2018-04-02 ENCOUNTER — Encounter: Payer: Self-pay | Admitting: Oncology

## 2018-04-02 ENCOUNTER — Inpatient Hospital Stay: Payer: PPO | Attending: Oncology

## 2018-04-02 ENCOUNTER — Other Ambulatory Visit: Payer: Self-pay

## 2018-04-02 ENCOUNTER — Inpatient Hospital Stay (HOSPITAL_BASED_OUTPATIENT_CLINIC_OR_DEPARTMENT_OTHER): Payer: PPO | Admitting: Oncology

## 2018-04-02 VITALS — BP 134/88 | HR 69 | Temp 96.6°F | Resp 18 | Wt 226.3 lb

## 2018-04-02 DIAGNOSIS — Z7952 Long term (current) use of systemic steroids: Secondary | ICD-10-CM | POA: Insufficient documentation

## 2018-04-02 DIAGNOSIS — Z7982 Long term (current) use of aspirin: Secondary | ICD-10-CM | POA: Insufficient documentation

## 2018-04-02 DIAGNOSIS — R5383 Other fatigue: Secondary | ICD-10-CM | POA: Diagnosis not present

## 2018-04-02 DIAGNOSIS — D631 Anemia in chronic kidney disease: Secondary | ICD-10-CM | POA: Diagnosis not present

## 2018-04-02 DIAGNOSIS — E2740 Unspecified adrenocortical insufficiency: Secondary | ICD-10-CM

## 2018-04-02 DIAGNOSIS — C3492 Malignant neoplasm of unspecified part of left bronchus or lung: Secondary | ICD-10-CM | POA: Diagnosis not present

## 2018-04-02 DIAGNOSIS — I129 Hypertensive chronic kidney disease with stage 1 through stage 4 chronic kidney disease, or unspecified chronic kidney disease: Secondary | ICD-10-CM | POA: Insufficient documentation

## 2018-04-02 DIAGNOSIS — R635 Abnormal weight gain: Secondary | ICD-10-CM | POA: Diagnosis not present

## 2018-04-02 DIAGNOSIS — T50905A Adverse effect of unspecified drugs, medicaments and biological substances, initial encounter: Secondary | ICD-10-CM

## 2018-04-02 DIAGNOSIS — C770 Secondary and unspecified malignant neoplasm of lymph nodes of head, face and neck: Secondary | ICD-10-CM | POA: Insufficient documentation

## 2018-04-02 DIAGNOSIS — Z79899 Other long term (current) drug therapy: Secondary | ICD-10-CM | POA: Diagnosis not present

## 2018-04-02 DIAGNOSIS — Z5112 Encounter for antineoplastic immunotherapy: Secondary | ICD-10-CM

## 2018-04-02 DIAGNOSIS — Z803 Family history of malignant neoplasm of breast: Secondary | ICD-10-CM | POA: Insufficient documentation

## 2018-04-02 DIAGNOSIS — J439 Emphysema, unspecified: Secondary | ICD-10-CM | POA: Diagnosis not present

## 2018-04-02 DIAGNOSIS — N183 Chronic kidney disease, stage 3 (moderate): Secondary | ICD-10-CM

## 2018-04-02 DIAGNOSIS — F418 Other specified anxiety disorders: Secondary | ICD-10-CM | POA: Diagnosis not present

## 2018-04-02 DIAGNOSIS — I2510 Atherosclerotic heart disease of native coronary artery without angina pectoris: Secondary | ICD-10-CM | POA: Insufficient documentation

## 2018-04-02 DIAGNOSIS — E032 Hypothyroidism due to medicaments and other exogenous substances: Secondary | ICD-10-CM | POA: Diagnosis not present

## 2018-04-02 DIAGNOSIS — R3914 Feeling of incomplete bladder emptying: Secondary | ICD-10-CM | POA: Diagnosis not present

## 2018-04-02 DIAGNOSIS — Z87891 Personal history of nicotine dependence: Secondary | ICD-10-CM | POA: Insufficient documentation

## 2018-04-02 DIAGNOSIS — Z87311 Personal history of (healed) other pathological fracture: Secondary | ICD-10-CM

## 2018-04-02 DIAGNOSIS — Z9221 Personal history of antineoplastic chemotherapy: Secondary | ICD-10-CM

## 2018-04-02 DIAGNOSIS — Z85038 Personal history of other malignant neoplasm of large intestine: Secondary | ICD-10-CM

## 2018-04-02 DIAGNOSIS — E663 Overweight: Secondary | ICD-10-CM | POA: Diagnosis not present

## 2018-04-02 DIAGNOSIS — N401 Enlarged prostate with lower urinary tract symptoms: Secondary | ICD-10-CM | POA: Diagnosis not present

## 2018-04-02 DIAGNOSIS — Z923 Personal history of irradiation: Secondary | ICD-10-CM | POA: Diagnosis not present

## 2018-04-02 DIAGNOSIS — T380X5S Adverse effect of glucocorticoids and synthetic analogues, sequela: Secondary | ICD-10-CM | POA: Diagnosis not present

## 2018-04-02 DIAGNOSIS — S22000A Wedge compression fracture of unspecified thoracic vertebra, initial encounter for closed fracture: Secondary | ICD-10-CM

## 2018-04-02 LAB — COMPREHENSIVE METABOLIC PANEL
ALBUMIN: 3.4 g/dL — AB (ref 3.5–5.0)
ALT: 22 U/L (ref 0–44)
AST: 19 U/L (ref 15–41)
Alkaline Phosphatase: 41 U/L (ref 38–126)
Anion gap: 5 (ref 5–15)
BILIRUBIN TOTAL: 0.4 mg/dL (ref 0.3–1.2)
BUN: 32 mg/dL — ABNORMAL HIGH (ref 8–23)
CALCIUM: 8.9 mg/dL (ref 8.9–10.3)
CO2: 28 mmol/L (ref 22–32)
CREATININE: 1.08 mg/dL (ref 0.61–1.24)
Chloride: 105 mmol/L (ref 98–111)
GFR calc Af Amer: >60 mL/min (ref >60)
GLUCOSE: 107 mg/dL — AB (ref 70–99)
Potassium: 4.4 mmol/L (ref 3.5–5.1)
Sodium: 138 mmol/L (ref 135–145)
Total Protein: 6.2 g/dL — ABNORMAL LOW (ref 6.5–8.1)

## 2018-04-02 LAB — CBC WITH DIFFERENTIAL/PLATELET
Abs Immature Granulocytes: 0.07 10*3/uL (ref 0.00–0.07)
Basophils Absolute: 0.1 10*3/uL (ref 0.0–0.1)
Basophils Relative: 1 %
EOS PCT: 2 %
Eosinophils Absolute: 0.2 10*3/uL (ref 0.0–0.5)
HEMATOCRIT: 39.1 % (ref 39.0–52.0)
HEMOGLOBIN: 12.4 g/dL — AB (ref 13.0–17.0)
IMMATURE GRANULOCYTES: 1 %
LYMPHS PCT: 7 %
Lymphs Abs: 0.7 10*3/uL (ref 0.7–4.0)
MCH: 30.5 pg (ref 26.0–34.0)
MCHC: 31.7 g/dL (ref 30.0–36.0)
MCV: 96.3 fL (ref 80.0–100.0)
MONOS PCT: 7 %
Monocytes Absolute: 0.7 10*3/uL (ref 0.1–1.0)
Neutro Abs: 8 10*3/uL — ABNORMAL HIGH (ref 1.7–7.7)
Neutrophils Relative %: 82 %
Platelets: 170 10*3/uL (ref 150–400)
RBC: 4.06 MIL/uL — ABNORMAL LOW (ref 4.22–5.81)
RDW: 15.1 % (ref 11.5–15.5)
WBC: 9.6 10*3/uL (ref 4.0–10.5)
nRBC: 0 % (ref 0.0–0.2)

## 2018-04-02 MED ORDER — HEPARIN SOD (PORK) LOCK FLUSH 100 UNIT/ML IV SOLN
500.0000 [IU] | Freq: Once | INTRAVENOUS | Status: AC
  Administered 2018-04-02: 500 [IU] via INTRAVENOUS

## 2018-04-02 MED ORDER — HEPARIN SOD (PORK) LOCK FLUSH 100 UNIT/ML IV SOLN
500.0000 [IU] | Freq: Once | INTRAVENOUS | Status: DC | PRN
  Filled 2018-04-02: qty 5

## 2018-04-02 MED ORDER — SODIUM CHLORIDE 0.9 % IV SOLN
1000.0000 mg | Freq: Once | INTRAVENOUS | Status: AC
  Administered 2018-04-02: 1000 mg via INTRAVENOUS
  Filled 2018-04-02: qty 20

## 2018-04-02 MED ORDER — SODIUM CHLORIDE 0.9% FLUSH
10.0000 mL | Freq: Once | INTRAVENOUS | Status: AC
  Administered 2018-04-02: 10 mL via INTRAVENOUS
  Filled 2018-04-02: qty 10

## 2018-04-02 MED ORDER — SODIUM CHLORIDE 0.9 % IV SOLN
Freq: Once | INTRAVENOUS | Status: AC
  Administered 2018-04-02: 10:00:00 via INTRAVENOUS
  Filled 2018-04-02: qty 250

## 2018-04-02 NOTE — Progress Notes (Signed)
Patient here for follow up. No concerns voiced.  °

## 2018-04-03 NOTE — Progress Notes (Signed)
Hematology/Oncology Follow Up Note [facility] [phone] [phone]   Patient Care Team: [name], MD as PCP - General (Family Medicine) [name], [name], MD (General Surgery) [name], MD as Consulting Physician (Oncology) [name], RN as Registered Nurse  Reason for Visit:  Follow up for lung cancer, immunotherapy side effects, adrenal insufficiency.  HISTORY OF PRESENTING ILLNESS:  Marc Gray 72 y.o.  male who presents for treatment of Stage IIIB (cT2b, cN3, cM0)  Adenocarcinoma of lung.  # Patient is s/p [place] and taxol concurrent RT for treatment of Stage IIIB Lung cancer. He recieved additional lung boost RT Currently on Durvalumab maintenance.   # Colon CA was listed in his history, however reviewing his surgical pathology from colonoscopy on 04/15/2014, during which he had polyps removed and all are negative for cancer.patient is scheduled for colonoscopy screening this year.  # 5/1/ 2019 CT scan was discussed at tumor board and changes consistent with radiation changes  INTERVAL HISTORY Patient he presents for follow-up of for assessment prior to immunotherapy maintenance for stage IIIb adenocarcinoma of lung.  #So far tolerates immunotherapy well.  Denies any diarrhea or rash. #Adrenal insufficiency follow-up with endocrinology.  Currently on prednisone 10 mg in a.m. and 5 mg a evening.  Blood pressure has been stable. #Acquired hypothyroidism, TSH improved.  On Synthroid 150 MCG daily.  Follow-up with endocrinology. #Continue to have weight gain due to steroid use.  #BPH, was started on Flomax during last visit..  Review of Systems  Constitutional: Positive for fatigue. Negative for appetite change, chills, diaphoresis, fever and unexpected weight change.  HENT:   Negative for hearing loss, lump/mass, mouth sores, nosebleeds and sore throat.   Eyes: Negative for eye problems and icterus.    Respiratory: Negative for chest tightness, cough, hemoptysis, shortness of breath and wheezing.        Chronic SOB with exertion.   Cardiovascular: Negative for chest pain, leg swelling and palpitations.  Gastrointestinal: Negative for abdominal distention, abdominal pain, blood in stool, diarrhea, nausea, rectal pain and vomiting.  Endocrine: Negative for hot flashes.  Genitourinary: Negative for bladder incontinence, difficulty urinating, dysuria, frequency, hematuria and nocturia.   Musculoskeletal: Negative for arthralgias, back pain, flank pain, gait problem and myalgias.  Skin: Negative for itching and rash.  Neurological: Negative for dizziness, gait problem, headaches, light-headedness, numbness and seizures.  Hematological: Negative for adenopathy. Does not bruise/bleed easily.  Psychiatric/Behavioral: Negative for confusion, decreased concentration and depression. The patient is not nervous/anxious.     MEDICAL HISTORY:  Past Medical History:  Diagnosis Date  . Anxiety   . Arthritis    SHOULDER  . Cataract   . Colon cancer ([place]) 2015   Pt states during his colonoscopy he had cancerous polyps removed.   . Depression    SINCE DIAGNOSIS  . Dyspnea    DOE  . Enlarged prostate   . Hypertension   . Lung cancer [facility])    Aug 2018  . Pain    DUE TO LUNG ISSUE    SURGICAL HISTORY: Past Surgical History:  Procedure Laterality Date  . CATARACT EXTRACTION W/ INTRAOCULAR LENS  IMPLANT, BILATERAL    . COLONOSCOPY    . EYE SURGERY    . LYMPH GLAND EXCISION N/A 02/09/2017   Procedure: CERVICAL LYMPH NODE BIOPSY;  Surgeon: Robert Bellow, MD;  Location: [facility] ORS;  Service: General;  Laterality: N/A;  . PORTACATH PLACEMENT Right 02/23/2017   Procedure: INSERTION PORT-A-CATH;  Surgeon: Robert Bellow, MD;  Location: [facility] ORS;  Service: General;  Laterality: Right;  . PROSTATE SURGERY    . TONSILLECTOMY      SOCIAL HISTORY: Social History   Socioeconomic History  .  Marital status: Married    Spouse name: Not on file  . Number of children: Not on file  . Years of education: Not on file  . Highest education level: Not on file  Occupational History  . Not on file  Social Needs  . Financial resource strain: Not on file  . Food insecurity:    Worry: Not on file    Inability: Not on file  . Transportation needs:    Medical: Not on file    Non-medical: Not on file  Tobacco Use  . Smoking status: Former Smoker    Packs/day: 1.00    Years: 53.00    Pack years: 53.00    Types: Cigarettes    Last attempt to quit: 04/14/2017    Years since quitting: 0.9  . Smokeless tobacco: Never Used  . Tobacco comment: Quit November 2018  Substance and Sexual Activity  . Alcohol use: Yes    Comment: occassional  . Drug use: No  . Sexual activity: Yes  Lifestyle  . Physical activity:    Days per week: Not on file    Minutes per session: Not on file  . Stress: Not on file  Relationships  . Social connections:    Talks on phone: Not on file    Gets together: Not on file    Attends religious service: Not on file    Active member of club or organization: Not on file    Attends meetings of clubs or organizations: Not on file    Relationship status: Not on file  . Intimate partner violence:    Fear of current or ex partner: Not on file    Emotionally abused: Not on file    Physically abused: Not on file    Forced sexual activity: Not on file  Other Topics Concern  . Not on file  Social History Narrative  . Not on file    FAMILY HISTORY: Family History  Problem Relation Age of Onset  . Breast cancer Maternal Grandmother   . Dementia Mother   . Diabetes Father   . Stroke Father     ALLERGIES:  has No Known Allergies.  MEDICATIONS:  Current Outpatient Medications  Medication Sig Dispense Refill  . aspirin 325 MG tablet Take 650 mg by mouth daily.     . budesonide-formoterol (SYMBICORT) 160-4.5 MCG/ACT inhaler Inhale into the lungs. Inhale 2  inhalations into the lungs 2 times daily    . calcium-vitamin D (OSCAL WITH D) 500-200 MG-UNIT tablet Take 1 tablet by mouth daily. 90 tablet 1  . diphenhydrAMINE-zinc acetate (BENADRYL EXTRA STRENGTH) cream Apply 1 application topically 3 (three) times daily as needed for itching. 28 g 0  . fluticasone (FLONASE) 50 MCG/ACT nasal spray Place 1 spray into both nostrils daily. 16 g 2  . levothyroxine (SYNTHROID, LEVOTHROID) 150 MCG tablet Take by mouth. Take 1 tablet (150 mcg total) by mouth once daily Take on an empty stomach with a glass of water at least 30-60 minutes before breakfast.    . lidocaine-prilocaine (EMLA) cream Apply over port 1-2 hours prior to chemotherapy treatment.  Cover with plastic wrap. 30 g 1  . Melatonin 10 MG TABS Take 10 mg by mouth at bedtime as needed.    [name]  Multiple Vitamin (MULTI-VITAMINS) TABS Take 1 tablet by mouth daily.     . ondansetron (ZOFRAN) 8 MG tablet Take 1 tablet (8 mg total) by mouth every 8 (eight) hours as needed for nausea or vomiting. 60 tablet 1  . pantoprazole (PROTONIX) 40 MG tablet Take 1 tablet (40 mg total) by mouth daily. 90 tablet 1  . predniSONE (DELTASONE) 10 MG tablet Take one tablet each morning. Take one-half tablet each afternoon (take between 2 - 5 PM) 45 tablet 0  . PROAIR HFA 108 (90 Base) MCG/ACT inhaler INHALE 2 PUFFS BY MOUTH EVERY 6 HOURS AS NEEDED 8.5 g 0  . prochlorperazine (COMPAZINE) 10 MG tablet Take 1 tablet (10 mg total) by mouth every 6 (six) hours as needed for nausea or vomiting. 60 tablet 2  . tamsulosin (FLOMAX) 0.4 MG CAPS capsule TAKE 1 CAPSULE(0.4 MG) BY MOUTH DAILY 90 capsule 0  . tiotropium (SPIRIVA HANDIHALER) 18 MCG inhalation capsule Place 1 capsule (18 mcg total) into inhaler and inhale daily. 30 capsule 1  . triamcinolone ointment (KENALOG) 0.5 % Apply 1 application topically 3 (three) times daily as needed. 30 g 0  . Chlorpheniramine-Phenylephrine (SINUS & ALLERGY) 4-10 MG tablet Take 1 tablet by mouth daily as  needed.      No current facility-administered medications for this visit.    Facility-Administered Medications Ordered in Other Visits  Medication Dose Route Frequency Provider Last Rate Last Dose  . sodium chloride flush (NS) 0.9 % injection 10 mL  10 mL Intravenous PRN [name], MD   10 mL at 04/03/17 1096      .  PHYSICAL EXAMINATION: ECOG PERFORMANCE STATUS: 1 - Symptomatic but completely ambulatory Vitals:   04/02/18 0904  BP: 134/88  Pulse: 69  Resp: 18  Temp: (!) 96.6 F (35.9 C)  SpO2: 98%   Filed Weights   04/02/18 0904  Weight: 226 lb 4.8 oz (102.6 kg)  Physical Exam  Constitutional: He is oriented to person, place, and time. No distress.  HENT:  Head: Normocephalic and atraumatic.  Right Ear: External ear normal.  Left Ear: External ear normal.  Nose: Nose normal.  Mouth/Throat: Oropharynx is clear and moist. No oropharyngeal exudate.  Eyes: Pupils are equal, round, and reactive to light. EOM are normal. Right eye exhibits no discharge. Left eye exhibits no discharge. No scleral icterus.  Neck: Normal range of motion. Neck supple. No JVD present.  Cardiovascular: Normal rate, regular rhythm and normal heart sounds.  No murmur heard. Pulmonary/Chest: Effort normal. No stridor. No respiratory distress. He has no wheezes. He has no rales. He exhibits no tenderness.  Decreased sound bilaterally lower lobe.   Abdominal: Soft. Bowel sounds are normal. He exhibits no distension and no mass. There is no tenderness. There is no rebound.  Musculoskeletal: Normal range of motion. He exhibits no edema or tenderness.  Lymphadenopathy:    He has no cervical adenopathy.  Neurological: He is alert and oriented to person, place, and time. No cranial nerve deficit. He exhibits normal muscle tone. Coordination normal.  Skin: Skin is warm and dry. No rash noted. He is not diaphoretic. No erythema.  Psychiatric: Memory, affect and judgment normal.   LABORATORY DATA:  I have  reviewed the data as listed Lab Results  Component Value Date   WBC 9.6 04/02/2018   HGB 12.4 (L) 04/02/2018   HCT 39.1 04/02/2018   MCV 96.3 04/02/2018   PLT 170 04/02/2018   Recent Labs    03/05/18 0454  03/19/18 0811 04/02/18 0846  NA 140 140 138  K 3.7 3.4* 4.4  CL 105 105 105  CO2 26 27 28   GLUCOSE 125* 108* 107*  BUN 25* 28* 32*  CREATININE 1.48* 1.37* 1.08  CALCIUM 9.1 9.0 8.9  GFRNONAA 46* 50* >60  GFRAA 53* 58* >60  PROT 6.5 6.4* 6.2*  ALBUMIN 3.7 3.6 3.4*  AST 22 20 19   ALT 20 19 22   ALKPHOS 51 51 41  BILITOT 0.6 0.7 0.4   RADIOGRAPHIC STUDIES: I have personally reviewed the radiological images as listed and agreed with the findings in the report.  12/17/2017  CAT scan was independently reviewed and discussed with patient.  Image showed a stable masslike consolidation in the left upper lobe with evolving radiation therapy changes in the medial left hemithorax.  Subpleural right upper lobe nodule is unchanged.  Small /pericardial effusion, slightly increased from prior. Patient had a new T2 superior endplate compression fracture. Aortic atherosclerosis, three-vessel coronary artery calcification.  Ascending aortic aneurysm.  Emphysema.  03/15/2018 CT chest without contrast  showed nonspecific findings identified highly worrisome for recurrent tumor or metastatic disease. Posttreatment changes within the left lobe.  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 Aortic atherosclerosis and emphysema. Multivessel coronary artery atherosclerotic calcifications.  ASSESSMENT & PLAN:  Cancer Staging Adenocarcinoma of left lung, stage 3 (HCC) Staging form: Lung, AJCC 8th Edition - Clinical stage from 02/14/2017: Stage IIIB (cT2b, cN3, cM0) - Signed by [name], MD on 02/14/2017  1. Adenocarcinoma of left lung, stage 3 ([place])   2. Adrenal insufficiency ([place])   3. Hypothyroidism due to medication   4. Weight gain due to medication   5. Benign  prostatic hyperplasia with incomplete bladder emptying   6. Compression fracture of body of thoracic vertebra (HCC)   7. 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 Encounter for antineoplastic immunotherapy    #Stage IIIB lung adenocarcinoma,  Discussed with patient.  Counts acceptable proceed with today's treatment.  #Renal insufficiency, continue prednisone 10 mg daily  #Immunotherapy.  Continue Synthroid 150 MCG daily.' #Weight gain, continue to help with an due to steroid use.  Discussed with lifestyle change including healthy diet, decrease portion of diet and  #T2 superior endplate compression fracture, continue calcium and vitamin D supplements.  Bone density was normal. # Multivessel coronary atherosclerosis.  Advised patient to discuss with primary care physician and cardiology.   # BPH, continue  Flomax for his BPH symptoms. Encourage patient to discuss with primary care physician.   We spent sufficient time to discuss many aspect of care, questions were answered to patient's satisfaction. Follow up in 2 weeks.   Total face to face encounter time for this patient visit was 60min. >50% of the time was  spent in counseling and coordination of care.   [name], MD, PhD Hematology Oncology [facility] Pager- [phone] 04/03/2018

## 2018-04-15 DIAGNOSIS — R079 Chest pain, unspecified: Secondary | ICD-10-CM | POA: Diagnosis not present

## 2018-04-16 ENCOUNTER — Other Ambulatory Visit: Payer: Self-pay

## 2018-04-16 ENCOUNTER — Inpatient Hospital Stay (HOSPITAL_BASED_OUTPATIENT_CLINIC_OR_DEPARTMENT_OTHER): Payer: PPO | Admitting: Nurse Practitioner

## 2018-04-16 ENCOUNTER — Inpatient Hospital Stay: Payer: PPO

## 2018-04-16 ENCOUNTER — Encounter: Payer: Self-pay | Admitting: Nurse Practitioner

## 2018-04-16 ENCOUNTER — Other Ambulatory Visit: Payer: Self-pay | Admitting: Nurse Practitioner

## 2018-04-16 VITALS — BP 122/76 | HR 64 | Temp 96.3°F | Resp 18 | Wt 225.7 lb

## 2018-04-16 DIAGNOSIS — Z87891 Personal history of nicotine dependence: Secondary | ICD-10-CM

## 2018-04-16 DIAGNOSIS — Z85038 Personal history of other malignant neoplasm of large intestine: Secondary | ICD-10-CM

## 2018-04-16 DIAGNOSIS — R5383 Other fatigue: Secondary | ICD-10-CM

## 2018-04-16 DIAGNOSIS — Z803 Family history of malignant neoplasm of breast: Secondary | ICD-10-CM

## 2018-04-16 DIAGNOSIS — E663 Overweight: Secondary | ICD-10-CM

## 2018-04-16 DIAGNOSIS — J439 Emphysema, unspecified: Secondary | ICD-10-CM | POA: Diagnosis not present

## 2018-04-16 DIAGNOSIS — I129 Hypertensive chronic kidney disease with stage 1 through stage 4 chronic kidney disease, or unspecified chronic kidney disease: Secondary | ICD-10-CM

## 2018-04-16 DIAGNOSIS — Z923 Personal history of irradiation: Secondary | ICD-10-CM | POA: Diagnosis not present

## 2018-04-16 DIAGNOSIS — N401 Enlarged prostate with lower urinary tract symptoms: Secondary | ICD-10-CM

## 2018-04-16 DIAGNOSIS — T380X5S Adverse effect of glucocorticoids and synthetic analogues, sequela: Secondary | ICD-10-CM | POA: Diagnosis not present

## 2018-04-16 DIAGNOSIS — F418 Other specified anxiety disorders: Secondary | ICD-10-CM

## 2018-04-16 DIAGNOSIS — C770 Secondary and unspecified malignant neoplasm of lymph nodes of head, face and neck: Secondary | ICD-10-CM | POA: Diagnosis not present

## 2018-04-16 DIAGNOSIS — R635 Abnormal weight gain: Secondary | ICD-10-CM

## 2018-04-16 DIAGNOSIS — N183 Chronic kidney disease, stage 3 (moderate): Secondary | ICD-10-CM

## 2018-04-16 DIAGNOSIS — Z9221 Personal history of antineoplastic chemotherapy: Secondary | ICD-10-CM | POA: Diagnosis not present

## 2018-04-16 DIAGNOSIS — E2740 Unspecified adrenocortical insufficiency: Secondary | ICD-10-CM | POA: Diagnosis not present

## 2018-04-16 DIAGNOSIS — C3492 Malignant neoplasm of unspecified part of left bronchus or lung: Secondary | ICD-10-CM

## 2018-04-16 DIAGNOSIS — Z87311 Personal history of (healed) other pathological fracture: Secondary | ICD-10-CM

## 2018-04-16 DIAGNOSIS — E032 Hypothyroidism due to medicaments and other exogenous substances: Secondary | ICD-10-CM | POA: Diagnosis not present

## 2018-04-16 DIAGNOSIS — Z5112 Encounter for antineoplastic immunotherapy: Secondary | ICD-10-CM | POA: Diagnosis not present

## 2018-04-16 DIAGNOSIS — Z7982 Long term (current) use of aspirin: Secondary | ICD-10-CM

## 2018-04-16 DIAGNOSIS — Z7952 Long term (current) use of systemic steroids: Secondary | ICD-10-CM

## 2018-04-16 DIAGNOSIS — I2510 Atherosclerotic heart disease of native coronary artery without angina pectoris: Secondary | ICD-10-CM

## 2018-04-16 DIAGNOSIS — Z79899 Other long term (current) drug therapy: Secondary | ICD-10-CM

## 2018-04-16 DIAGNOSIS — R3914 Feeling of incomplete bladder emptying: Secondary | ICD-10-CM

## 2018-04-16 LAB — COMPREHENSIVE METABOLIC PANEL
ALT: 21 U/L (ref 0–44)
AST: 21 U/L (ref 15–41)
Albumin: 3.5 g/dL (ref 3.5–5.0)
Alkaline Phosphatase: 48 U/L (ref 38–126)
Anion gap: 6 (ref 5–15)
BUN: 26 mg/dL — ABNORMAL HIGH (ref 8–23)
CHLORIDE: 107 mmol/L (ref 98–111)
CO2: 26 mmol/L (ref 22–32)
Calcium: 8.8 mg/dL — ABNORMAL LOW (ref 8.9–10.3)
Creatinine, Ser: 1.29 mg/dL — ABNORMAL HIGH (ref 0.61–1.24)
GFR, EST NON AFRICAN AMERICAN: 54 mL/min — AB (ref >60)
Glucose, Bld: 115 mg/dL — ABNORMAL HIGH (ref 70–99)
Potassium: 3.8 mmol/L (ref 3.5–5.1)
Sodium: 139 mmol/L (ref 135–145)
Total Bilirubin: 0.6 mg/dL (ref 0.3–1.2)
Total Protein: 6.4 g/dL — ABNORMAL LOW (ref 6.5–8.1)

## 2018-04-16 LAB — CBC WITH DIFFERENTIAL/PLATELET
Abs Immature Granulocytes: 0.03 10*3/uL (ref 0.00–0.07)
Basophils Absolute: 0 10*3/uL (ref 0.0–0.1)
Basophils Relative: 1 %
EOS PCT: 4 %
Eosinophils Absolute: 0.2 10*3/uL (ref 0.0–0.5)
HCT: 40.1 % (ref 39.0–52.0)
HEMOGLOBIN: 12.8 g/dL — AB (ref 13.0–17.0)
Immature Granulocytes: 1 %
LYMPHS PCT: 17 %
Lymphs Abs: 1.1 10*3/uL (ref 0.7–4.0)
MCH: 30.3 pg (ref 26.0–34.0)
MCHC: 31.9 g/dL (ref 30.0–36.0)
MCV: 95 fL (ref 80.0–100.0)
Monocytes Absolute: 0.6 10*3/uL (ref 0.1–1.0)
Monocytes Relative: 10 %
NEUTROS ABS: 4.2 10*3/uL (ref 1.7–7.7)
Neutrophils Relative %: 67 %
Platelets: 171 10*3/uL (ref 150–400)
RBC: 4.22 MIL/uL (ref 4.22–5.81)
RDW: 14.7 % (ref 11.5–15.5)
WBC: 6.2 10*3/uL (ref 4.0–10.5)
nRBC: 0 % (ref 0.0–0.2)

## 2018-04-16 LAB — TSH: TSH: 15.632 u[IU]/mL — AB (ref 0.350–4.500)

## 2018-04-16 MED ORDER — SODIUM CHLORIDE 0.9 % IV SOLN
1000.0000 mg | Freq: Once | INTRAVENOUS | Status: AC
  Administered 2018-04-16: 1000 mg via INTRAVENOUS
  Filled 2018-04-16: qty 20

## 2018-04-16 MED ORDER — LEVOTHYROXINE SODIUM 175 MCG PO TABS: 175.0000 ug | ORAL_TABLET | Freq: Every day | ORAL | 0 refills | Status: DC

## 2018-04-16 MED ORDER — BUDESONIDE-FORMOTEROL FUMARATE 160-4.5 MCG/ACT IN AERO: 2.0000 | INHALATION_SPRAY | Freq: Two times a day (BID) | RESPIRATORY_TRACT | 0 refills | Status: DC

## 2018-04-16 MED ORDER — HEPARIN SOD (PORK) LOCK FLUSH 100 UNIT/ML IV SOLN
500.0000 [IU] | Freq: Once | INTRAVENOUS | Status: AC
  Administered 2018-04-16: 500 [IU] via INTRAVENOUS

## 2018-04-16 MED ORDER — HEPARIN SOD (PORK) LOCK FLUSH 100 UNIT/ML IV SOLN: 500.0000 [IU] | Freq: Once | INTRAVENOUS | Status: DC | PRN

## 2018-04-16 MED ORDER — SODIUM CHLORIDE 0.9% FLUSH
10.0000 mL | Freq: Once | INTRAVENOUS | Status: AC
  Administered 2018-04-16: 10 mL via INTRAVENOUS
  Filled 2018-04-16: qty 10

## 2018-04-16 MED ORDER — HEPARIN SOD (PORK) LOCK FLUSH 100 UNIT/ML IV SOLN
INTRAVENOUS | Status: AC
  Filled 2018-04-16: qty 5

## 2018-04-16 MED ORDER — SODIUM CHLORIDE 0.9 % IV SOLN
Freq: Once | INTRAVENOUS | Status: AC
  Administered 2018-04-16: 11:00:00 via INTRAVENOUS
  Filled 2018-04-16: qty 250

## 2018-04-16 NOTE — Progress Notes (Signed)
Hematology/Oncology Follow Up Note [name] [phone] [phone]   Patient Care Team: [name], MD as PCP - General (Family Medicine) [name], [name], MD (General Surgery) [name], MD as Consulting Physician (Oncology) [name], RN as Registered Nurse  Reason for Visit:  Follow up for lung cancer & evaluation prior to immunotherapy w/ history of adrenal insufficiency and hypothyroidism  HISTORY OF PRESENTING ILLNESS:  Marc Gray 72 y.o.  male who initially presented for consideration of treatment of Stage IIIB (cT2b, cN3, cM0) Adenocarcinoma of lung.  # Initiated carboplatin-taxol with concurrent RT for treatment of stage IIIB lung cancer on 03/06/17. He received 6 cycles of chemotherapy, completing treatment on 04/10/17. He received additional lung boost RT.   # Initiated maintenance immunotherapy with Durvalumab/Imfinzi on 06/07/17.   # History of Colon Cancer per patient; surgical pathology from colonoscopy on 04/15/14 reviewed which showed polyps were removed and all were negative for cancer.   # 09/26/2017 CT scan was discussed at tumor board and changes consistent with radiation changes  # 03/15/18- CT Chest WO contrast negative for recurrent tumor or metastatic disease. Radiation changes persistent.   INTERVAL HISTORY Marc Gray, 72 year old male with above history of stage IIIB adenocarcinoma of the lung returns to clinic today for assessment prior to maintenance immunotherapy which he has received every 2 weeks since 06/07/17 and overall has tolerated well. He continues to deny diarrhea, rash.   In the interim, he was seen by primary care, for shortness of breath and intermittent chest pain not associated with exertion.  He has been referred to cardiology.  He was previously started on maintenance inhalers which he has not been using recently.  Does use albuterol rescue inhaler several times a  day with some improvement in his symptoms.  He has history of emphysema.  He says that his shortness of breath may have been somewhat better when he was taking maintenance inhalers and requests refill today.  He is followed by endocrinology for adrenal insufficiency and is scheduled for follow up in 06/2018. Currently on prednisone 10mg  in morning and 5mg  at night. Blood pressure has been stable and he monitors his pressures at home regularly.    He continues Synthroid 137mcg daily for acquired hypothyroidism. Last TSH was 4.858 on 03/19/18 which is improved. He complains of lack of "motivation" but does not feel overly tired.   He previously complained of BPH-like symptoms and was started on Flomax by Dr. [name]. At previous visit we discussed him following up with his PCP regarding Flomax for BPH symptoms which he says he 'forgot to talk about'.    Review of Systems  Constitutional: Positive for fatigue. Negative for appetite change, chills, diaphoresis, fever and unexpected weight change.  HENT:   Negative for hearing loss, lump/mass, mouth sores, nosebleeds and sore throat.   Eyes: Negative for eye problems and icterus.  Respiratory: Negative for chest tightness, cough, hemoptysis, shortness of breath and wheezing.        Chronic SOB with exertion.   Cardiovascular: Negative for chest pain, leg swelling and palpitations.  Gastrointestinal: Negative for abdominal distention, abdominal pain, blood in stool, diarrhea, nausea, rectal pain and vomiting.  Endocrine: Negative for hot flashes.  Genitourinary: Negative for bladder incontinence, difficulty urinating, dysuria, frequency, hematuria and nocturia.   Musculoskeletal: Negative for arthralgias, back pain, flank pain, gait problem and myalgias.  Skin: Negative for itching and rash.  Neurological: Negative for dizziness, gait problem, headaches, light-headedness,  numbness and seizures.  Hematological: Negative for adenopathy. Does not bruise/bleed  easily.  Psychiatric/Behavioral: Negative for confusion, decreased concentration and depression. The patient is not nervous/anxious.     MEDICAL HISTORY:  Past Medical History:  Diagnosis Date  . Anxiety   . Arthritis    SHOULDER  . Cataract   . Colon cancer ([place]) 2015   Pt states during his colonoscopy he had cancerous polyps removed.   . Depression    SINCE DIAGNOSIS  . Dyspnea    DOE  . Enlarged prostate   . Hypertension   . Lung cancer [facility])    Aug 2018  . Pain    DUE TO LUNG ISSUE    SURGICAL HISTORY: Past Surgical History:  Procedure Laterality Date  . CATARACT EXTRACTION W/ INTRAOCULAR LENS  IMPLANT, BILATERAL    . COLONOSCOPY    . EYE SURGERY    . LYMPH GLAND EXCISION N/A 02/09/2017   Procedure: CERVICAL LYMPH NODE BIOPSY;  Surgeon: Robert Bellow, MD;  Location: [place] [facility];  Service: General;  Laterality: N/A;  . PORTACATH PLACEMENT Right 02/23/2017   Procedure: INSERTION PORT-A-CATH;  Surgeon: Robert Bellow, MD;  Location: [facility];  Service: General;  Laterality: Right;  . PROSTATE SURGERY    . TONSILLECTOMY      SOCIAL HISTORY: Social History   Socioeconomic History  . Marital status: Married    Spouse name: Not on file  . Number of children: Not on file  . Years of education: Not on file  . Highest education level: Not on file  Occupational History  . Not on file  Social Needs  . Financial resource strain: Not on file  . Food insecurity:    Worry: Not on file    Inability: Not on file  . Transportation needs:    Medical: Not on file    Non-medical: Not on file  Tobacco Use  . Smoking status: Former Smoker    Packs/day: 1.00    Years: 53.00    Pack years: 53.00    Types: Cigarettes    Last attempt to quit: 04/14/2017    Years since quitting: 1.0  . Smokeless tobacco: Never Used  . Tobacco comment: Quit November 2018  Substance and Sexual Activity  . Alcohol use: Yes    Comment: occassional  . Drug use: No  . Sexual activity: Yes    Lifestyle  . Physical activity:    Days per week: Not on file    Minutes per session: Not on file  . Stress: Not on file  Relationships  . Social connections:    Talks on phone: Not on file    Gets together: Not on file    Attends religious service: Not on file    Active member of club or organization: Not on file    Attends meetings of clubs or organizations: Not on file    Relationship status: Not on file  . Intimate partner violence:    Fear of current or ex partner: Not on file    Emotionally abused: Not on file    Physically abused: Not on file    Forced sexual activity: Not on file  Other Topics Concern  . Not on file  Social History Narrative  . Not on file    FAMILY HISTORY: Family History  Problem Relation Age of Onset  . Breast cancer Maternal Grandmother   . Dementia Mother   . Diabetes Father   . Stroke Father  ALLERGIES:  has No Known Allergies.  MEDICATIONS:  Current Outpatient Medications  Medication Sig Dispense Refill  . aspirin 325 MG tablet Take 650 mg by mouth daily.     . budesonide-formoterol (SYMBICORT) 160-4.5 MCG/ACT inhaler Inhale 2 puffs into the lungs 2 (two) times daily. 1 Inhaler 0  . calcium-vitamin D (OSCAL WITH D) 500-200 MG-UNIT tablet Take 1 tablet by mouth daily. 90 tablet 1  . Chlorpheniramine-Phenylephrine (SINUS & ALLERGY) 4-10 MG tablet Take 1 tablet by mouth daily as needed.     . diphenhydrAMINE-zinc acetate (BENADRYL EXTRA STRENGTH) cream Apply 1 application topically 3 (three) times daily as needed for itching. 28 g 0  . fluticasone (FLONASE) 50 MCG/ACT nasal spray Place 1 spray into both nostrils daily. 16 g 2  . levothyroxine (SYNTHROID, LEVOTHROID) 175 MCG tablet Take 1 tablet (175 mcg total) by mouth daily before breakfast. 30 tablet 0  . lidocaine-prilocaine (EMLA) cream Apply over port 1-2 hours prior to chemotherapy treatment.  Cover with plastic wrap. 30 g 1  . Melatonin 10 MG TABS Take 10 mg by mouth at bedtime as  needed.    . Multiple Vitamin (MULTI-VITAMINS) TABS Take 1 tablet by mouth daily.     . ondansetron (ZOFRAN) 8 MG tablet Take 1 tablet (8 mg total) by mouth every 8 (eight) hours as needed for nausea or vomiting. 60 tablet 1  . pantoprazole (PROTONIX) 40 MG tablet Take 1 tablet (40 mg total) by mouth daily. 90 tablet 1  . predniSONE (DELTASONE) 10 MG tablet Take one tablet each morning. Take one-half tablet each afternoon (take between 2 - 5 PM) 45 tablet 0  . PROAIR HFA 108 (90 Base) MCG/ACT inhaler INHALE 2 PUFFS BY MOUTH EVERY 6 HOURS AS NEEDED 8.5 g 0  . prochlorperazine (COMPAZINE) 10 MG tablet Take 1 tablet (10 mg total) by mouth every 6 (six) hours as needed for nausea or vomiting. 60 tablet 2  . tamsulosin (FLOMAX) 0.4 MG CAPS capsule TAKE 1 CAPSULE(0.4 MG) BY MOUTH DAILY 90 capsule 0  . triamcinolone ointment (KENALOG) 0.5 % Apply 1 application topically 3 (three) times daily as needed. 30 g 0   No current facility-administered medications for this visit.    Facility-Administered Medications Ordered in Other Visits  Medication Dose Route Frequency Provider Last Rate Last Dose  . sodium chloride flush (NS) 0.9 % injection 10 mL  10 mL Intravenous PRN [name], MD   10 mL at 04/03/17 8657    PHYSICAL EXAMINATION:  ECOG PERFORMANCE STATUS: 1 - Symptomatic but completely ambulatory   Vitals:   04/16/18 0922  BP: 122/76  Pulse: 64  Resp: 18  Temp: (!) 96.3 F (35.7 C)  SpO2: 98%   Filed Weights   04/16/18 0922  Weight: 225 lb 11.2 oz (102.4 kg)  Physical Exam  Constitutional: He is oriented to person, place, and time and well-developed, well-nourished, and in no distress. No distress.  Accompanied by wife  HENT:  Head: Normocephalic and atraumatic.  Right Ear: External ear normal.  Left Ear: External ear normal.  Mouth/Throat: Oropharynx is clear and moist. No oropharyngeal exudate.  Eyes: Conjunctivae are normal. Right eye exhibits no discharge. Left eye exhibits no  discharge. No scleral icterus.  Neck: Normal range of motion. Neck supple. No JVD present.  Cardiovascular: Normal rate, regular rhythm and normal heart sounds.  No murmur heard. Pulmonary/Chest: Effort normal and breath sounds normal. No stridor. No respiratory distress. He has no wheezes. He has no rales.  He exhibits no tenderness.  Abdominal: Soft. Bowel sounds are normal. He exhibits no distension and no mass. There is no tenderness. There is no rebound.  Musculoskeletal: Normal range of motion. He exhibits no edema or tenderness.  Lymphadenopathy:    He has no cervical adenopathy.  Neurological: He is alert and oriented to person, place, and time. No cranial nerve deficit. He exhibits normal muscle tone. Coordination normal.  Skin: Skin is warm and dry. No rash noted. He is not diaphoretic. No erythema.  Psychiatric: Memory, affect and judgment normal.   LABORATORY DATA:  I have reviewed the data as listed Lab Results  Component Value Date   WBC 6.2 04/16/2018   HGB 12.8 (L) 04/16/2018   HCT 40.1 04/16/2018   MCV 95.0 04/16/2018   PLT 171 04/16/2018   Recent Labs    03/19/18 0811 04/02/18 0846 04/16/18 0904  NA 140 138 139  K 3.4* 4.4 3.8  CL 105 105 107  CO2 27 28 26   GLUCOSE 108* 107* 115*  BUN 28* 32* 26*  CREATININE 1.37* 1.08 1.29*  CALCIUM 9.0 8.9 8.8*  GFRNONAA 50* >60 54*  GFRAA 58* >60 >60  PROT 6.4* 6.2* 6.4*  ALBUMIN 3.6 3.4* 3.5  AST 20 19 21   ALT 19 22 21   ALKPHOS 51 41 48  BILITOT 0.7 0.4 0.6   RADIOGRAPHIC STUDIES:  I have personally reviewed the radiological images as listed and agreed with the findings in the report. 12/17/2017  CAT scan was independently reviewed and discussed with patient.  Image showed a stable masslike consolidation in the left upper lobe with evolving radiation therapy changes in the medial left hemithorax.  Subpleural right upper lobe nodule is unchanged.  Small /pericardial effusion, slightly increased from prior. Patient  had a new T2 superior endplate compression fracture. Aortic atherosclerosis, three-vessel coronary artery calcification.  Ascending aortic aneurysm.  Emphysema.  03/15/2018 CT chest without contrast showed no specific findings identified highly worrisome for recurrent tumor or metastatic disease. Post-treatment changes within the left lobe.  Stable. 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 Aortic atherosclerosis and emphysema. Multivessel coronary artery atherosclerotic calcifications.   ASSESSMENT & PLAN:  Cancer Staging Adenocarcinoma of left lung, stage 3 (HCC) Staging form: Lung, AJCC 8th Edition - Clinical stage from 02/14/2017: Stage IIIB (cT2b, cN3, cM0) - Signed by [name], MD on 02/14/2017  1. Adenocarcinoma of left lung, stage 3 ([place])   2. Adrenal insufficiency ([place])   3. Hypothyroidism due to medication    #Stage IIIB lung adenocarcinoma- s/p concurrent carbo-taxol with RT followed by boost. Currently on maintenance durvalumab since 06/07/2017.  Has been tolerating well without adverse reactions for dose adjustments.  Labs today reviewed with patient and acceptable for treatment.  We will plan to continue maintenance Imfinzi until disease progression or unacceptable toxicity.  Plan to re-image 05/2018.  #Renal insufficiency-stable.  Continue prednisone 10 mg daily and 5mg  nightly.  Continue to follow-up with Dr. Nilda Simmer, Endocrinology as scheduled.  #Acquired hypothyroidism- r/t immunotherapy. TSH 15.632 today. Worse. On Synthroid and reports compliance. Increase Synthroid to 175 mcg daily. Will plan to check tsh and thyroid profile in 1 month to re-evaluate.   # Weight gain- BMI 29.68- overweight.  Pretreatment weight was 175 pounds.  Chronic steroid use likely contributory.  Again reinforced lifestyle changes including healthy diet, monitoring carbohydrate intake as well as portion sizes.  Encouraged implementation of activity as tolerated.   #  Emphysema-history of emphysema.  Previously on Spiriva (09/2017) Symbicort (  11/2017).  Has not had refilled recently and reports improvement in symptoms with use.  Requests refill today which we provided for 1 month.  Request patient follow-up with PCP for continued evaluation and management.  #T2 superior endplate compression fracture- Bone density on 12/31/2017 revealed T score of -8 in AP spine, normal.  Continue calcium and vitamin D supplements.  # Multivessel coronary atherosclerosis- finding on prior imaging. Has seen PCP for discussion of and has been referred to cardiology.     # BPH- improved w/ flomax. Continue. Again encouraged patient to discuss with PCP for continued management.    Return to clinic in 2 weeks for labs and follow-up with Dr. [name] in consideration of durvalumab.   We spent sufficient time to discuss many aspect of care, questions were answered to patient's satisfaction.  Beckey Rutter, DNP, AGNP-C [place] at [facility] [phone] (work cell) [phone] (office)  CC: Dr. [name]

## 2018-04-16 NOTE — Progress Notes (Signed)
Patient here for follow up. He saw Dr. Kary Kos yesterday and he sent in a referral for pt to see Dr. Ubaldo Glassing. Pt has shortness of breath with activity.

## 2018-04-19 DIAGNOSIS — R079 Chest pain, unspecified: Secondary | ICD-10-CM | POA: Diagnosis not present

## 2018-04-30 ENCOUNTER — Inpatient Hospital Stay (HOSPITAL_BASED_OUTPATIENT_CLINIC_OR_DEPARTMENT_OTHER): Payer: PPO | Admitting: Oncology

## 2018-04-30 ENCOUNTER — Encounter: Payer: Self-pay | Admitting: Oncology

## 2018-04-30 ENCOUNTER — Inpatient Hospital Stay: Payer: PPO | Attending: Oncology

## 2018-04-30 ENCOUNTER — Other Ambulatory Visit: Payer: Self-pay

## 2018-04-30 ENCOUNTER — Inpatient Hospital Stay: Payer: PPO

## 2018-04-30 VITALS — BP 133/89 | HR 71 | Temp 96.1°F | Resp 18 | Wt 226.2 lb

## 2018-04-30 DIAGNOSIS — M1990 Unspecified osteoarthritis, unspecified site: Secondary | ICD-10-CM

## 2018-04-30 DIAGNOSIS — Z85038 Personal history of other malignant neoplasm of large intestine: Secondary | ICD-10-CM | POA: Diagnosis not present

## 2018-04-30 DIAGNOSIS — Z87891 Personal history of nicotine dependence: Secondary | ICD-10-CM

## 2018-04-30 DIAGNOSIS — N400 Enlarged prostate without lower urinary tract symptoms: Secondary | ICD-10-CM | POA: Insufficient documentation

## 2018-04-30 DIAGNOSIS — E2740 Unspecified adrenocortical insufficiency: Secondary | ICD-10-CM

## 2018-04-30 DIAGNOSIS — C3492 Malignant neoplasm of unspecified part of left bronchus or lung: Secondary | ICD-10-CM

## 2018-04-30 DIAGNOSIS — I129 Hypertensive chronic kidney disease with stage 1 through stage 4 chronic kidney disease, or unspecified chronic kidney disease: Secondary | ICD-10-CM | POA: Insufficient documentation

## 2018-04-30 DIAGNOSIS — Z86010 Personal history of colonic polyps: Secondary | ICD-10-CM | POA: Insufficient documentation

## 2018-04-30 DIAGNOSIS — R112 Nausea with vomiting, unspecified: Secondary | ICD-10-CM | POA: Diagnosis not present

## 2018-04-30 DIAGNOSIS — I313 Pericardial effusion (noninflammatory): Secondary | ICD-10-CM | POA: Insufficient documentation

## 2018-04-30 DIAGNOSIS — F418 Other specified anxiety disorders: Secondary | ICD-10-CM

## 2018-04-30 DIAGNOSIS — R05 Cough: Secondary | ICD-10-CM | POA: Diagnosis not present

## 2018-04-30 DIAGNOSIS — N184 Chronic kidney disease, stage 4 (severe): Secondary | ICD-10-CM | POA: Diagnosis not present

## 2018-04-30 DIAGNOSIS — E039 Hypothyroidism, unspecified: Secondary | ICD-10-CM | POA: Diagnosis not present

## 2018-04-30 DIAGNOSIS — Z9221 Personal history of antineoplastic chemotherapy: Secondary | ICD-10-CM | POA: Diagnosis not present

## 2018-04-30 DIAGNOSIS — I2510 Atherosclerotic heart disease of native coronary artery without angina pectoris: Secondary | ICD-10-CM

## 2018-04-30 DIAGNOSIS — R197 Diarrhea, unspecified: Secondary | ICD-10-CM | POA: Diagnosis not present

## 2018-04-30 DIAGNOSIS — E032 Hypothyroidism due to medicaments and other exogenous substances: Secondary | ICD-10-CM | POA: Diagnosis not present

## 2018-04-30 DIAGNOSIS — Z5112 Encounter for antineoplastic immunotherapy: Secondary | ICD-10-CM | POA: Diagnosis not present

## 2018-04-30 DIAGNOSIS — Z7982 Long term (current) use of aspirin: Secondary | ICD-10-CM

## 2018-04-30 DIAGNOSIS — X58XXXS Exposure to other specified factors, sequela: Secondary | ICD-10-CM

## 2018-04-30 DIAGNOSIS — Z7952 Long term (current) use of systemic steroids: Secondary | ICD-10-CM | POA: Insufficient documentation

## 2018-04-30 DIAGNOSIS — Z79899 Other long term (current) drug therapy: Secondary | ICD-10-CM | POA: Diagnosis not present

## 2018-04-30 DIAGNOSIS — R635 Abnormal weight gain: Secondary | ICD-10-CM | POA: Diagnosis not present

## 2018-04-30 DIAGNOSIS — R5383 Other fatigue: Secondary | ICD-10-CM | POA: Diagnosis not present

## 2018-04-30 DIAGNOSIS — S22000S Wedge compression fracture of unspecified thoracic vertebra, sequela: Secondary | ICD-10-CM | POA: Insufficient documentation

## 2018-04-30 DIAGNOSIS — D631 Anemia in chronic kidney disease: Secondary | ICD-10-CM | POA: Insufficient documentation

## 2018-04-30 DIAGNOSIS — T50905A Adverse effect of unspecified drugs, medicaments and biological substances, initial encounter: Secondary | ICD-10-CM

## 2018-04-30 DIAGNOSIS — S22000A Wedge compression fracture of unspecified thoracic vertebra, initial encounter for closed fracture: Secondary | ICD-10-CM

## 2018-04-30 DIAGNOSIS — N189 Chronic kidney disease, unspecified: Secondary | ICD-10-CM

## 2018-04-30 LAB — COMPREHENSIVE METABOLIC PANEL
ALT: 20 U/L (ref 0–44)
AST: 19 U/L (ref 15–41)
Albumin: 3.6 g/dL (ref 3.5–5.0)
Alkaline Phosphatase: 52 U/L (ref 38–126)
Anion gap: 9 (ref 5–15)
BUN: 31 mg/dL — AB (ref 8–23)
CO2: 26 mmol/L (ref 22–32)
CREATININE: 1.45 mg/dL — AB (ref 0.61–1.24)
Calcium: 9.1 mg/dL (ref 8.9–10.3)
Chloride: 105 mmol/L (ref 98–111)
GFR, EST AFRICAN AMERICAN: 56 mL/min — AB (ref >60)
GFR, EST NON AFRICAN AMERICAN: 48 mL/min — AB (ref >60)
Glucose, Bld: 116 mg/dL — ABNORMAL HIGH (ref 70–99)
Potassium: 3.7 mmol/L (ref 3.5–5.1)
Sodium: 140 mmol/L (ref 135–145)
TOTAL PROTEIN: 6.6 g/dL (ref 6.5–8.1)
Total Bilirubin: 0.4 mg/dL (ref 0.3–1.2)

## 2018-04-30 LAB — CBC WITH DIFFERENTIAL/PLATELET
Abs Immature Granulocytes: 0.07 10*3/uL (ref 0.00–0.07)
Basophils Absolute: 0.1 10*3/uL (ref 0.0–0.1)
Basophils Relative: 1 %
EOS ABS: 0.2 10*3/uL (ref 0.0–0.5)
EOS PCT: 2 %
HCT: 41.6 % (ref 39.0–52.0)
Hemoglobin: 13.4 g/dL (ref 13.0–17.0)
Immature Granulocytes: 1 %
Lymphocytes Relative: 15 %
Lymphs Abs: 1 10*3/uL (ref 0.7–4.0)
MCH: 29.8 pg (ref 26.0–34.0)
MCHC: 32.2 g/dL (ref 30.0–36.0)
MCV: 92.7 fL (ref 80.0–100.0)
MONO ABS: 0.7 10*3/uL (ref 0.1–1.0)
Monocytes Relative: 10 %
Neutro Abs: 4.6 10*3/uL (ref 1.7–7.7)
Neutrophils Relative %: 71 %
PLATELETS: 183 10*3/uL (ref 150–400)
RBC: 4.49 MIL/uL (ref 4.22–5.81)
RDW: 14.6 % (ref 11.5–15.5)
WBC: 6.6 10*3/uL (ref 4.0–10.5)
nRBC: 0 % (ref 0.0–0.2)

## 2018-04-30 MED ORDER — ONDANSETRON HCL 8 MG PO TABS: 8.0000 mg | ORAL_TABLET | Freq: Two times a day (BID) | ORAL | 1 refills | Status: DC | PRN

## 2018-04-30 MED ORDER — SODIUM CHLORIDE 0.9 % IV SOLN
1000.0000 mg | Freq: Once | INTRAVENOUS | Status: AC
  Administered 2018-04-30: 1000 mg via INTRAVENOUS
  Filled 2018-04-30: qty 20

## 2018-04-30 MED ORDER — SODIUM CHLORIDE 0.9 % IV SOLN
Freq: Once | INTRAVENOUS | Status: AC
  Administered 2018-04-30: 10:00:00 via INTRAVENOUS
  Filled 2018-04-30: qty 250

## 2018-04-30 MED ORDER — HEPARIN SOD (PORK) LOCK FLUSH 100 UNIT/ML IV SOLN
500.0000 [IU] | Freq: Once | INTRAVENOUS | Status: AC | PRN
  Administered 2018-04-30: 500 [IU]

## 2018-04-30 MED ORDER — HEPARIN SOD (PORK) LOCK FLUSH 100 UNIT/ML IV SOLN
INTRAVENOUS | Status: AC
  Filled 2018-04-30: qty 5

## 2018-04-30 NOTE — Progress Notes (Signed)
Hematology/Oncology Follow Up Note [facility] [phone] [phone]   Patient Care Team: [name], MD as PCP - General (Family Medicine) [name], [name], MD (General Surgery) [name], MD as Consulting Physician (Oncology) [name], RN as Registered Nurse  Reason for Visit:  Follow up for lung cancer, immunotherapy side effects, adrenal insufficiency.  HISTORY OF PRESENTING ILLNESS:  Marc Gray 72 y.o.  male who presents for treatment of Stage IIIB (cT2b, cN3, cM0)  Adenocarcinoma of lung.  # Patient is s/p [place] and taxol concurrent RT for treatment of Stage IIIB Lung cancer. He recieved additional lung boost RT Currently on Durvalumab maintenance.   # Colon CA was listed in his history, however reviewing his surgical pathology from colonoscopy on 04/15/2014, during which he had polyps removed and all are negative for cancer.patient is scheduled for colonoscopy screening this year.  # 5/1/ 2019 CT scan was discussed at tumor board and changes consistent with radiation changes  INTERVAL HISTORY Patient he presents for follow-up of for assessment prior to immunotherapy maintenance for stage IIIb adenocarcinoma of the lung.  So far tolerated immunotherapy well.  Denies any diarrhea, rash. #Adrenal insufficiency follows up with endocrinology.  Currently on prednisone 10 mg in the a.m. and 5 mg in the evening.  Blood pressure has been stable. #Acquired hypothyroidism, TSH was checked 2 weeks ago and showed increased to 15.  Synthroid dose has been increased to 175 MCG.  Check TSH at next visit. #Chronic shortness of breath/pericardia effusion, he has been referred to cardiology Dr.Fath and was evaluated.   will have cardiac MRI to evaluate myocardium.  . Review of Systems  Constitutional: Positive for fatigue. Negative for appetite change, chills, fever and unexpected weight change.  HENT:   Negative for  hearing loss and voice change.   Eyes: Negative for eye problems and icterus.  Respiratory: Positive for shortness of breath. Negative for chest tightness and cough.   Cardiovascular: Negative for chest pain and leg swelling.  Gastrointestinal: Negative for abdominal distention and abdominal pain.  Endocrine: Negative for hot flashes.  Genitourinary: Negative for difficulty urinating, dysuria and frequency.   Musculoskeletal: Negative for arthralgias.  Skin: Negative for itching and rash.  Neurological: Negative for light-headedness and numbness.  Hematological: Negative for adenopathy. Does not bruise/bleed easily.  Psychiatric/Behavioral: Negative for confusion.    MEDICAL HISTORY:  Past Medical History:  Diagnosis Date  . Anxiety   . Arthritis    SHOULDER  . Cataract   . Colon cancer ([place]) 2015   Pt states during his colonoscopy he had cancerous polyps removed.   . Depression    SINCE DIAGNOSIS  . Dyspnea    DOE  . Enlarged prostate   . Hypertension   . Lung cancer [facility])    Aug 2018  . Pain    DUE TO LUNG ISSUE    SURGICAL HISTORY: Past Surgical History:  Procedure Laterality Date  . CATARACT EXTRACTION W/ INTRAOCULAR LENS  IMPLANT, BILATERAL    . COLONOSCOPY    . EYE SURGERY    . LYMPH GLAND EXCISION N/A 02/09/2017   Procedure: CERVICAL LYMPH NODE BIOPSY;  Surgeon: Robert Bellow, MD;  Location: [facility];  Service: General;  Laterality: N/A;  . PORTACATH PLACEMENT Right 02/23/2017   Procedure: INSERTION PORT-A-CATH;  Surgeon: Robert Bellow, MD;  Location: [facility];  Service: General;  Laterality: Right;  . PROSTATE SURGERY    . TONSILLECTOMY  SOCIAL HISTORY: Social History   Socioeconomic History  . Marital status: Married    Spouse name: Not on file  . Number of children: Not on file  . Years of education: Not on file  . Highest education level: Not on file  Occupational History  . Not on file  Social Needs  . Financial resource strain: Not  on file  . Food insecurity:    Worry: Not on file    Inability: Not on file  . Transportation needs:    Medical: Not on file    Non-medical: Not on file  Tobacco Use  . Smoking status: Former Smoker    Packs/day: 1.00    Years: 53.00    Pack years: 53.00    Types: Cigarettes    Last attempt to quit: 04/14/2017    Years since quitting: 1.0  . Smokeless tobacco: Never Used  . Tobacco comment: Quit November 2018  Substance and Sexual Activity  . Alcohol use: Yes    Comment: occassional  . Drug use: No  . Sexual activity: Yes  Lifestyle  . Physical activity:    Days per week: Not on file    Minutes per session: Not on file  . Stress: Not on file  Relationships  . Social connections:    Talks on phone: Not on file    Gets together: Not on file    Attends religious service: Not on file    Active member of club or organization: Not on file    Attends meetings of clubs or organizations: Not on file    Relationship status: Not on file  . Intimate partner violence:    Fear of current or ex partner: Not on file    Emotionally abused: Not on file    Physically abused: Not on file    Forced sexual activity: Not on file  Other Topics Concern  . Not on file  Social History Narrative  . Not on file    FAMILY HISTORY: Family History  Problem Relation Age of Onset  . Breast cancer Maternal Grandmother   . Dementia Mother   . Diabetes Father   . Stroke Father     ALLERGIES:  has No Known Allergies.  MEDICATIONS:  Current Outpatient Medications  Medication Sig Dispense Refill  . aspirin 325 MG tablet Take 650 mg by mouth daily.     . budesonide-formoterol (SYMBICORT) 160-4.5 MCG/ACT inhaler Inhale 2 puffs into the lungs 2 (two) times daily. 1 Inhaler 0  . calcium-vitamin D (OSCAL WITH D) 500-200 MG-UNIT tablet Take 1 tablet by mouth daily. 90 tablet 1  . Chlorpheniramine-Phenylephrine (SINUS & ALLERGY) 4-10 MG tablet Take 1 tablet by mouth daily as needed.     .  diphenhydrAMINE-zinc acetate (BENADRYL EXTRA STRENGTH) cream Apply 1 application topically 3 (three) times daily as needed for itching. 28 g 0  . fluticasone (FLONASE) 50 MCG/ACT nasal spray Place 1 spray into both nostrils daily. 16 g 2  . levothyroxine (SYNTHROID, LEVOTHROID) 175 MCG tablet TAKE 1 TABLET(175 MCG) BY MOUTH DAILY BEFORE BREAKFAST 90 tablet 0  . lidocaine-prilocaine (EMLA) cream Apply over port 1-2 hours prior to chemotherapy treatment.  Cover with plastic wrap. 30 g 1  . Melatonin 10 MG TABS Take 10 mg by mouth at bedtime as needed.    . Multiple Vitamin (MULTI-VITAMINS) TABS Take 1 tablet by mouth daily.     . ondansetron (ZOFRAN) 8 MG tablet Take 1 tablet (8 mg total) by mouth every 12 (  twelve) hours as needed for nausea or vomiting. 60 tablet 1  . pantoprazole (PROTONIX) 40 MG tablet Take 1 tablet (40 mg total) by mouth daily. 90 tablet 1  . predniSONE (DELTASONE) 10 MG tablet Take one tablet each morning. Take one-half tablet each afternoon (take between 2 - 5 PM) 45 tablet 0  . PROAIR HFA 108 (90 Base) MCG/ACT inhaler INHALE 2 PUFFS BY MOUTH EVERY 6 HOURS AS NEEDED 8.5 g 0  . prochlorperazine (COMPAZINE) 10 MG tablet Take 1 tablet (10 mg total) by mouth every 6 (six) hours as needed for nausea or vomiting. 60 tablet 2  . tamsulosin (FLOMAX) 0.4 MG CAPS capsule TAKE 1 CAPSULE(0.4 MG) BY MOUTH DAILY 90 capsule 0  . tiotropium (SPIRIVA HANDIHALER) 18 MCG inhalation capsule INHALE 1 CAPSULE DAILY    . triamcinolone ointment (KENALOG) 0.5 % Apply 1 application topically 3 (three) times daily as needed. 30 g 0   No current facility-administered medications for this visit.    Facility-Administered Medications Ordered in Other Visits  Medication Dose Route Frequency Provider Last Rate Last Dose  . sodium chloride flush (NS) 0.9 % injection 10 mL  10 mL Intravenous PRN [name], MD   10 mL at 04/03/17 7096      .  PHYSICAL EXAMINATION: ECOG PERFORMANCE STATUS: 1 - Symptomatic  but completely ambulatory Vitals:   04/30/18 0912  BP: 133/89  Pulse: 71  Resp: 18  Temp: (!) 96.1 F (35.6 C)  SpO2: 97%   Filed Weights   04/30/18 0912  Weight: 226 lb 3.2 oz (102.6 kg)  Physical Exam  Constitutional: He is oriented to person, place, and time. No distress.  HENT:  Head: Normocephalic and atraumatic.  Right Ear: External ear normal.  Left Ear: External ear normal.  Nose: Nose normal.  Mouth/Throat: Oropharynx is clear and moist. No oropharyngeal exudate.  Eyes: Pupils are equal, round, and reactive to light. EOM are normal. No scleral icterus.  Neck: Normal range of motion. Neck supple.  Cardiovascular: Normal rate, regular rhythm and normal heart sounds.  No murmur heard. Pulmonary/Chest: Effort normal. No respiratory distress. He has no wheezes. He has no rales. He exhibits no tenderness.  Decreased sound bilaterally lower lobe.   Abdominal: Soft. Bowel sounds are normal. He exhibits no distension and no mass. There is no tenderness. There is no rebound.  Musculoskeletal: Normal range of motion. He exhibits no edema or tenderness.  Lymphadenopathy:    He has no cervical adenopathy.  Neurological: He is alert and oriented to person, place, and time. No cranial nerve deficit. He exhibits normal muscle tone. Coordination normal.  Skin: Skin is warm and dry. No rash noted. He is not diaphoretic. No erythema.  Psychiatric: Memory, affect and judgment normal.   LABORATORY DATA:  I have reviewed the data as listed Lab Results  Component Value Date   WBC 6.6 04/30/2018   HGB 13.4 04/30/2018   HCT 41.6 04/30/2018   MCV 92.7 04/30/2018   PLT 183 04/30/2018   Recent Labs    04/02/18 0846 04/16/18 0904 04/30/18 0850  NA 138 139 140  K 4.4 3.8 3.7  CL 105 107 105  CO2 28 26 26   GLUCOSE 107* 115* 116*  BUN 32* 26* 31*  CREATININE 1.08 1.29* 1.45*  CALCIUM 8.9 8.8* 9.1  GFRNONAA >60 54* 48*  GFRAA >60 >60 56*  PROT 6.2* 6.4* 6.6  ALBUMIN 3.4* 3.5 3.6    AST 19 21 19   ALT 22 21  20  ALKPHOS 41 48 52  BILITOT 0.4 0.6 0.4   RADIOGRAPHIC STUDIES: I have personally reviewed the radiological images as listed and agreed with the findings in the report.  12/17/2017  CAT scan was independently reviewed and discussed with patient.  Image showed a stable masslike consolidation in the left upper lobe with evolving radiation therapy changes in the medial left hemithorax.  Subpleural right upper lobe nodule is unchanged.  Small /pericardial effusion, slightly increased from prior. Patient had a new T2 superior endplate compression fracture. Aortic atherosclerosis, three-vessel coronary artery calcification.  Ascending aortic aneurysm.  Emphysema.  03/15/2018 CT chest without contrast  showed nonspecific findings identified highly worrisome for recurrent tumor or metastatic disease. Posttreatment changes within the left lobe.  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 Aortic atherosclerosis and emphysema. Multivessel coronary artery atherosclerotic calcifications.  ASSESSMENT & PLAN:  Cancer Staging Adenocarcinoma of left lung, stage 3 (HCC) Staging form: Lung, AJCC 8th Edition - Clinical stage from 02/14/2017: Stage IIIB (cT2b, cN3, cM0) - Signed by [name], MD on 02/14/2017  1. Adenocarcinoma of left lung, stage 3 ([place])   2. Hypothyroidism, unspecified type   3. Chronic kidney disease, unspecified CKD stage   4. Adrenal insufficiency ([place])   5. Hypothyroidism due to medication   6. Weight gain due to medication   7. Compression fracture of body of thoracic vertebra (HCC)   8. Encounter for antineoplastic immunotherapy    #Stage IIIB lung adenocarcinoma,  Labs are reviewed and discussed with patient.  Count acceptable to proceed with today's durvalumab treatment.  #Adrenal insufficiency continue prednisone., #Hypothyroidism due to immunotherapy, continue Synthroid 175 MCG daily.  Check TSH at next visit. #Weight  gain, discussed about increasing exercise and healthy dieting. #T2 superior endplate compression fracture, continue calcium and vitamin D supplements.  Bone density was normal.  Continue to monitor. # Multivessel coronary atherosclerosis/pleural effusion patient has established care with Dr. Ubaldo Glassing for further cardiology evaluation..  # BPH, continue Flomax.. Encourage patient to discuss with primary care physician.  #CKD, fluctuating creatinine.  Will check SPEP at next visit. We spent sufficient time to discuss many aspect of care, questions were answered to patient's satisfaction.  Follow up in 2 weeks.     [name], MD, PhD Hematology Oncology [facility] Pager- [phone] 04/30/2018

## 2018-04-30 NOTE — Progress Notes (Signed)
Patient here for follow up. Requesting refill for zofran and spiriva. Complains of pain to back of neck "that comes and goes."

## 2018-05-08 ENCOUNTER — Telehealth: Payer: Self-pay | Admitting: *Deleted

## 2018-05-08 NOTE — Telephone Encounter (Signed)
Patient informed per VO Dr [name] to contact his PCP. Also asked that he call me back if PCP cannot see him. He agrees to this

## 2018-05-08 NOTE — Telephone Encounter (Signed)
Wife called and reports that patient has diarrhea and vomiting, he has been exposed to his daughter who also has the same problem. He denies fever and has not taken anything for his symptoms Please advise if he needs to see his PCP I advised that he can take Imodium AD and a Tylenol when she asked if he could take an ASA.

## 2018-05-12 ENCOUNTER — Other Ambulatory Visit: Payer: Self-pay | Admitting: Oncology

## 2018-05-14 ENCOUNTER — Inpatient Hospital Stay: Payer: PPO

## 2018-05-14 ENCOUNTER — Inpatient Hospital Stay (HOSPITAL_BASED_OUTPATIENT_CLINIC_OR_DEPARTMENT_OTHER): Payer: PPO | Admitting: Oncology

## 2018-05-14 ENCOUNTER — Encounter: Payer: Self-pay | Admitting: Oncology

## 2018-05-14 ENCOUNTER — Other Ambulatory Visit: Payer: Self-pay

## 2018-05-14 VITALS — BP 138/79 | HR 77 | Temp 96.2°F | Resp 18 | Wt 206.3 lb

## 2018-05-14 DIAGNOSIS — I313 Pericardial effusion (noninflammatory): Secondary | ICD-10-CM

## 2018-05-14 DIAGNOSIS — Z9221 Personal history of antineoplastic chemotherapy: Secondary | ICD-10-CM

## 2018-05-14 DIAGNOSIS — X58XXXS Exposure to other specified factors, sequela: Secondary | ICD-10-CM

## 2018-05-14 DIAGNOSIS — Z79899 Other long term (current) drug therapy: Secondary | ICD-10-CM

## 2018-05-14 DIAGNOSIS — E039 Hypothyroidism, unspecified: Secondary | ICD-10-CM

## 2018-05-14 DIAGNOSIS — E2740 Unspecified adrenocortical insufficiency: Secondary | ICD-10-CM | POA: Diagnosis not present

## 2018-05-14 DIAGNOSIS — I2510 Atherosclerotic heart disease of native coronary artery without angina pectoris: Secondary | ICD-10-CM

## 2018-05-14 DIAGNOSIS — R197 Diarrhea, unspecified: Secondary | ICD-10-CM | POA: Diagnosis not present

## 2018-05-14 DIAGNOSIS — T50905A Adverse effect of unspecified drugs, medicaments and biological substances, initial encounter: Secondary | ICD-10-CM

## 2018-05-14 DIAGNOSIS — C3492 Malignant neoplasm of unspecified part of left bronchus or lung: Secondary | ICD-10-CM

## 2018-05-14 DIAGNOSIS — Z87891 Personal history of nicotine dependence: Secondary | ICD-10-CM

## 2018-05-14 DIAGNOSIS — R5383 Other fatigue: Secondary | ICD-10-CM

## 2018-05-14 DIAGNOSIS — I129 Hypertensive chronic kidney disease with stage 1 through stage 4 chronic kidney disease, or unspecified chronic kidney disease: Secondary | ICD-10-CM

## 2018-05-14 DIAGNOSIS — N184 Chronic kidney disease, stage 4 (severe): Secondary | ICD-10-CM | POA: Diagnosis not present

## 2018-05-14 DIAGNOSIS — R05 Cough: Secondary | ICD-10-CM | POA: Diagnosis not present

## 2018-05-14 DIAGNOSIS — Z85038 Personal history of other malignant neoplasm of large intestine: Secondary | ICD-10-CM

## 2018-05-14 DIAGNOSIS — R112 Nausea with vomiting, unspecified: Secondary | ICD-10-CM | POA: Diagnosis not present

## 2018-05-14 DIAGNOSIS — Z7952 Long term (current) use of systemic steroids: Secondary | ICD-10-CM

## 2018-05-14 DIAGNOSIS — R635 Abnormal weight gain: Secondary | ICD-10-CM

## 2018-05-14 DIAGNOSIS — N189 Chronic kidney disease, unspecified: Secondary | ICD-10-CM

## 2018-05-14 DIAGNOSIS — E032 Hypothyroidism due to medicaments and other exogenous substances: Secondary | ICD-10-CM

## 2018-05-14 DIAGNOSIS — Z5112 Encounter for antineoplastic immunotherapy: Secondary | ICD-10-CM

## 2018-05-14 DIAGNOSIS — Z7982 Long term (current) use of aspirin: Secondary | ICD-10-CM

## 2018-05-14 DIAGNOSIS — N400 Enlarged prostate without lower urinary tract symptoms: Secondary | ICD-10-CM

## 2018-05-14 DIAGNOSIS — F418 Other specified anxiety disorders: Secondary | ICD-10-CM

## 2018-05-14 DIAGNOSIS — S22000S Wedge compression fracture of unspecified thoracic vertebra, sequela: Secondary | ICD-10-CM | POA: Diagnosis not present

## 2018-05-14 DIAGNOSIS — M1990 Unspecified osteoarthritis, unspecified site: Secondary | ICD-10-CM

## 2018-05-14 LAB — COMPREHENSIVE METABOLIC PANEL
ALT: 37 U/L (ref 0–44)
AST: 33 U/L (ref 15–41)
Albumin: 3.4 g/dL — ABNORMAL LOW (ref 3.5–5.0)
Alkaline Phosphatase: 40 U/L (ref 38–126)
Anion gap: 5 (ref 5–15)
BUN: 25 mg/dL — ABNORMAL HIGH (ref 8–23)
CO2: 27 mmol/L (ref 22–32)
Calcium: 8.7 mg/dL — ABNORMAL LOW (ref 8.9–10.3)
Chloride: 108 mmol/L (ref 98–111)
Creatinine, Ser: 1.37 mg/dL — ABNORMAL HIGH (ref 0.61–1.24)
GFR calc Af Amer: 60 mL/min — ABNORMAL LOW (ref >60)
GFR calc non Af Amer: 52 mL/min — ABNORMAL LOW (ref >60)
Glucose, Bld: 109 mg/dL — ABNORMAL HIGH (ref 70–99)
POTASSIUM: 3.8 mmol/L (ref 3.5–5.1)
Sodium: 140 mmol/L (ref 135–145)
Total Bilirubin: 0.4 mg/dL (ref 0.3–1.2)
Total Protein: 6.3 g/dL — ABNORMAL LOW (ref 6.5–8.1)

## 2018-05-14 LAB — CBC WITH DIFFERENTIAL/PLATELET
Abs Immature Granulocytes: 0.03 10*3/uL (ref 0.00–0.07)
Basophils Absolute: 0 10*3/uL (ref 0.0–0.1)
Basophils Relative: 1 %
Eosinophils Absolute: 0.2 10*3/uL (ref 0.0–0.5)
Eosinophils Relative: 4 %
HCT: 39.9 % (ref 39.0–52.0)
Hemoglobin: 12.7 g/dL — ABNORMAL LOW (ref 13.0–17.0)
IMMATURE GRANULOCYTES: 1 %
Lymphocytes Relative: 20 %
Lymphs Abs: 1.2 10*3/uL (ref 0.7–4.0)
MCH: 29.3 pg (ref 26.0–34.0)
MCHC: 31.8 g/dL (ref 30.0–36.0)
MCV: 92.1 fL (ref 80.0–100.0)
Monocytes Absolute: 0.7 10*3/uL (ref 0.1–1.0)
Monocytes Relative: 11 %
Neutro Abs: 4 10*3/uL (ref 1.7–7.7)
Neutrophils Relative %: 63 %
Platelets: 186 10*3/uL (ref 150–400)
RBC: 4.33 MIL/uL (ref 4.22–5.81)
RDW: 14.4 % (ref 11.5–15.5)
WBC: 6.2 10*3/uL (ref 4.0–10.5)
nRBC: 0 % (ref 0.0–0.2)

## 2018-05-14 LAB — TSH: TSH: 3.279 u[IU]/mL (ref 0.350–4.500)

## 2018-05-14 MED ORDER — TRIAMCINOLONE ACETONIDE 0.5 % EX OINT: 1.0000 "application " | TOPICAL_OINTMENT | Freq: Three times a day (TID) | CUTANEOUS | 1 refills | Status: DC | PRN

## 2018-05-14 MED ORDER — SODIUM CHLORIDE 0.9 % IV SOLN
Freq: Once | INTRAVENOUS | Status: AC
  Administered 2018-05-14: 10:00:00 via INTRAVENOUS
  Filled 2018-05-14: qty 250

## 2018-05-14 MED ORDER — SODIUM CHLORIDE 0.9 % IV SOLN
1000.0000 mg | Freq: Once | INTRAVENOUS | Status: AC
  Administered 2018-05-14: 1000 mg via INTRAVENOUS
  Filled 2018-05-14: qty 20

## 2018-05-14 MED ORDER — HEPARIN SOD (PORK) LOCK FLUSH 100 UNIT/ML IV SOLN
500.0000 [IU] | Freq: Once | INTRAVENOUS | Status: AC | PRN
  Administered 2018-05-14: 500 [IU]

## 2018-05-14 NOTE — Progress Notes (Signed)
Patient here for follow up. Pt complains of increased shortness of breath and pain to shoulder blade and low back at times.

## 2018-05-14 NOTE — Progress Notes (Signed)
Hematology/Oncology Follow Up Note [facility] Telephone:([phone] [phone]   Patient Care Team: [name], MD as PCP - General (Family Medicine) [name], [name], MD (General Surgery) [name], MD as Consulting Physician (Oncology) [name], RN as Registered Nurse  Reason for Visit:  Follow up for lung cancer, immunotherapy side effects, adrenal insufficiency.  HISTORY OF PRESENTING ILLNESS:  Marc Gray 72 y.o.  male who presents for treatment of Stage IIIB (cT2b, cN3, cM0)  Adenocarcinoma of lung.  # Patient is s/p [place] and taxol concurrent RT for treatment of Stage IIIB Lung cancer. He recieved additional lung boost RT Currently on Durvalumab maintenance.   # Colon CA was listed in his history, however reviewing his surgical pathology from colonoscopy on 04/15/2014, during which he had polyps removed and all are negative for cancer.patient is scheduled for colonoscopy screening this year.  # 5/1/ 2019 CT scan was discussed at tumor board and changes consistent with radiation changes  INTERVAL HISTORY Patient he presents for follow-up of for assessment prior to immunotherapy maintenance for stage IIIb adenocarcinoma of the lung.  During the interval, patient had nausea vomiting diarrhea. His daughter and granddaughter also had similar symptoms. No fever. Symptom resolved.  # Adrenal insufficiency, on prednisone 10mg  in AM and 5mg  in PM.  # Acquired hypothyroidism, synthroid at 169mcg, recently adjusted due to elevated TSH.  # Chronic SOB, feels SOB has gotten worse. He is undergoing work up with Dr.Fath for cardiac MRI.  Denies chest pain.  Chronic cough at baseline.  # Reports 2 weeks of left shoulder blade pain and lower back at times, non radiating, usually triggered by bending forward. Symptoms resolved after he lies flat.   Review of Systems  Constitutional: Positive for fatigue. Negative for appetite  change, chills, fever and unexpected weight change.  HENT:   Negative for hearing loss and voice change.   Eyes: Negative for eye problems and icterus.  Respiratory: Positive for shortness of breath. Negative for chest tightness and cough.   Cardiovascular: Negative for chest pain and leg swelling.  Gastrointestinal: Negative for abdominal distention and abdominal pain.  Endocrine: Negative for hot flashes.  Genitourinary: Negative for difficulty urinating, dysuria and frequency.   Musculoskeletal: Negative for arthralgias.  Skin: Negative for itching and rash.  Neurological: Negative for light-headedness and numbness.  Hematological: Negative for adenopathy. Does not bruise/bleed easily.  Psychiatric/Behavioral: Negative for confusion.    MEDICAL HISTORY:  Past Medical History:  Diagnosis Date  . Anxiety   . Arthritis    SHOULDER  . Cataract   . Colon cancer ([place]) 2015   Pt states during his colonoscopy he had cancerous polyps removed.   . Depression    SINCE DIAGNOSIS  . Dyspnea    DOE  . Enlarged prostate   . Hypertension   . Lung cancer [facility])    Aug 2018  . Pain    DUE TO LUNG ISSUE    SURGICAL HISTORY: Past Surgical History:  Procedure Laterality Date  . CATARACT EXTRACTION W/ INTRAOCULAR LENS  IMPLANT, BILATERAL    . COLONOSCOPY    . EYE SURGERY    . LYMPH GLAND EXCISION N/A 02/09/2017   Procedure: CERVICAL LYMPH NODE BIOPSY;  Surgeon: Robert Bellow, MD;  Location: [facility];  Service: General;  Laterality: N/A;  . PORTACATH PLACEMENT Right 02/23/2017   Procedure: INSERTION PORT-A-CATH;  Surgeon: Robert Bellow, MD;  Location: [facility];  Service: General;  Laterality: Right;  .  PROSTATE SURGERY    . TONSILLECTOMY      SOCIAL HISTORY: Social History   Socioeconomic History  . Marital status: Married    Spouse name: Not on file  . Number of children: Not on file  . Years of education: Not on file  . Highest education level: Not on file  Occupational  History  . Not on file  Social Needs  . Financial resource strain: Not on file  . Food insecurity:    Worry: Not on file    Inability: Not on file  . Transportation needs:    Medical: Not on file    Non-medical: Not on file  Tobacco Use  . Smoking status: Former Smoker    Packs/day: 1.00    Years: 53.00    Pack years: 53.00    Types: Cigarettes    Last attempt to quit: 04/14/2017    Years since quitting: 1.0  . Smokeless tobacco: Never Used  . Tobacco comment: Quit November 2018  Substance and Sexual Activity  . Alcohol use: Yes    Comment: occassional  . Drug use: No  . Sexual activity: Yes  Lifestyle  . Physical activity:    Days per week: Not on file    Minutes per session: Not on file  . Stress: Not on file  Relationships  . Social connections:    Talks on phone: Not on file    Gets together: Not on file    Attends religious service: Not on file    Active member of club or organization: Not on file    Attends meetings of clubs or organizations: Not on file    Relationship status: Not on file  . Intimate partner violence:    Fear of current or ex partner: Not on file    Emotionally abused: Not on file    Physically abused: Not on file    Forced sexual activity: Not on file  Other Topics Concern  . Not on file  Social History Narrative  . Not on file    FAMILY HISTORY: Family History  Problem Relation Age of Onset  . Breast cancer Maternal Grandmother   . Dementia Mother   . Diabetes Father   . Stroke Father     ALLERGIES:  has No Known Allergies.  MEDICATIONS:  Current Outpatient Medications  Medication Sig Dispense Refill  . aspirin 325 MG tablet Take 650 mg by mouth daily.     . budesonide-formoterol (SYMBICORT) 160-4.5 MCG/ACT inhaler Inhale 2 puffs into the lungs 2 (two) times daily. 1 Inhaler 0  . calcium-vitamin D (OSCAL WITH D) 500-200 MG-UNIT tablet Take 1 tablet by mouth daily. 90 tablet 1  . Chlorpheniramine-Phenylephrine (SINUS &  ALLERGY) 4-10 MG tablet Take 1 tablet by mouth daily as needed.     . diphenhydrAMINE-zinc acetate (BENADRYL EXTRA STRENGTH) cream Apply 1 application topically 3 (three) times daily as needed for itching. 28 g 0  . fluticasone (FLONASE) 50 MCG/ACT nasal spray Place 1 spray into both nostrils daily. 16 g 2  . levothyroxine (SYNTHROID, LEVOTHROID) 175 MCG tablet TAKE 1 TABLET(175 MCG) BY MOUTH DAILY BEFORE BREAKFAST 90 tablet 0  . lidocaine-prilocaine (EMLA) cream Apply over port 1-2 hours prior to chemotherapy treatment.  Cover with plastic wrap. 30 g 1  . Melatonin 10 MG TABS Take 10 mg by mouth at bedtime as needed.    . Multiple Vitamin (MULTI-VITAMINS) TABS Take 1 tablet by mouth daily.     . ondansetron (ZOFRAN) 8  MG tablet Take 1 tablet (8 mg total) by mouth every 12 (twelve) hours as needed for nausea or vomiting. 60 tablet 1  . pantoprazole (PROTONIX) 40 MG tablet Take 1 tablet (40 mg total) by mouth daily. 90 tablet 1  . predniSONE (DELTASONE) 10 MG tablet Take one tablet each morning. Take one-half tablet each afternoon (take between 2 - 5 PM) 45 tablet 0  . PROAIR HFA 108 (90 Base) MCG/ACT inhaler INHALE 2 PUFFS BY MOUTH EVERY 6 HOURS AS NEEDED 8.5 g 0  . prochlorperazine (COMPAZINE) 10 MG tablet Take 1 tablet (10 mg total) by mouth every 6 (six) hours as needed for nausea or vomiting. 60 tablet 2  . tamsulosin (FLOMAX) 0.4 MG CAPS capsule TAKE 1 CAPSULE(0.4 MG) BY MOUTH DAILY 90 capsule 0  . tiotropium (SPIRIVA HANDIHALER) 18 MCG inhalation capsule INHALE 1 CAPSULE DAILY    . triamcinolone ointment (KENALOG) 0.5 % Apply 1 application topically 3 (three) times daily as needed. 30 g 1   No current facility-administered medications for this visit.    Facility-Administered Medications Ordered in Other Visits  Medication Dose Route Frequency Provider Last Rate Last Dose  . sodium chloride flush (NS) 0.9 % injection 10 mL  10 mL Intravenous PRN [name], MD   10 mL at 04/03/17 3212        .  PHYSICAL EXAMINATION: ECOG PERFORMANCE STATUS: 1 - Symptomatic but completely ambulatory Vitals:   05/14/18 0914  BP: 138/79  Pulse: 77  Resp: 18  Temp: (!) 96.2 F (35.7 C)  SpO2: 97%   Filed Weights   05/14/18 0914  Weight: 206 lb 4.8 oz (93.6 kg)  Physical Exam  Constitutional: He is oriented to person, place, and time. No distress.  HENT:  Head: Normocephalic and atraumatic.  Right Ear: External ear normal.  Left Ear: External ear normal.  Nose: Nose normal.  Mouth/Throat: Oropharynx is clear and moist. No oropharyngeal exudate.  Eyes: Pupils are equal, round, and reactive to light. EOM are normal. No scleral icterus.  Neck: Normal range of motion. Neck supple.  Cardiovascular: Normal rate, regular rhythm and normal heart sounds.  No murmur heard. Pulmonary/Chest: Effort normal. No respiratory distress. He has no wheezes. He has no rales. He exhibits no tenderness.  Decreased sound bilaterally lower lobe.   Abdominal: Soft. Bowel sounds are normal. He exhibits no distension. There is no abdominal tenderness.  Musculoskeletal: Normal range of motion.        General: No tenderness or edema.  Lymphadenopathy:    He has no cervical adenopathy.  Neurological: He is alert and oriented to person, place, and time. No cranial nerve deficit. He exhibits normal muscle tone. Coordination normal.  Skin: Skin is warm and dry. No rash noted. He is not diaphoretic. No erythema.  Psychiatric: Memory, affect and judgment normal.   LABORATORY DATA:  I have reviewed the data as listed Lab Results  Component Value Date   WBC 6.2 05/14/2018   HGB 12.7 (L) 05/14/2018   HCT 39.9 05/14/2018   MCV 92.1 05/14/2018   PLT 186 05/14/2018   Recent Labs    04/16/18 0904 04/30/18 0850 05/14/18 0848  NA 139 140 140  K 3.8 3.7 3.8  CL 107 105 108  CO2 26 26 27   GLUCOSE 115* 116* 109*  BUN 26* 31* 25*  CREATININE 1.29* 1.45* 1.37*  CALCIUM 8.8* 9.1 8.7*  GFRNONAA 54* 48* 52*   GFRAA >60 56* 60*  PROT 6.4* 6.6 6.3*  ALBUMIN 3.5 3.6 3.4*  AST 21 19 33  ALT 21 20 37  ALKPHOS 48 52 40  BILITOT 0.6 0.4 0.4   RADIOGRAPHIC STUDIES: I have personally reviewed the radiological images as listed and agreed with the findings in the report.  12/17/2017  CAT scan was independently reviewed and discussed with patient.  Image showed a stable masslike consolidation in the left upper lobe with evolving radiation therapy changes in the medial left hemithorax.  Subpleural right upper lobe nodule is unchanged.  Small /pericardial effusion, slightly increased from prior. Patient had a new T2 superior endplate compression fracture. Aortic atherosclerosis, three-vessel coronary artery calcification.  Ascending aortic aneurysm.  Emphysema.  03/15/2018 CT chest without contrast  showed nonspecific findings identified highly worrisome for recurrent tumor or metastatic disease. Posttreatment changes within the left lobe.  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 Aortic atherosclerosis and emphysema. Multivessel coronary artery atherosclerotic calcifications.  ASSESSMENT & PLAN:  Cancer Staging Adenocarcinoma of left lung, stage 3 (HCC) Staging form: Lung, AJCC 8th Edition - Clinical stage from 02/14/2017: Stage IIIB (cT2b, cN3, cM0) - Signed by [name], MD on 02/14/2017  1. Adenocarcinoma of left lung, stage 3 ([place])   2. Adrenal insufficiency ([place])   3. Hypothyroidism due to medication   4. Encounter for antineoplastic immunotherapy   5. Weight gain due to medication   6. Chronic kidney disease, unspecified CKD stage    #Stage IIIB lung adenocarcinoma,  Labs are reviewed and discussed with patient.  Counts acceptable to proceed with today's durvalumab.   #Adrenal insufficiency continue prednisone at current regimen.   #Hypothyroidism due to immunotherapy, continue synthroid 150mcg daily. TSH normalized.  #T2 superior endplate compression fracture,  continue calcium and vitamin D supplements.  Bone density was normal.  Continue to monitor. # Multivessel coronary atherosclerosis/pleural effusion undergoing work up with Dr. Ubaldo Glassing for further cardiology evaluation.  # BPH,continue Flomax.. Encourage patient to discuss with primary care physician.  #CKD, fluctuating creatinine.  check SPEP. We spent sufficient time to discuss many aspect of care, questions were answered to patient's satisfaction. Follow up in 2 weeks. Total face to face encounter time for this patient visit was 25 min. >50% of the time was  spent in counseling and coordination of care.   [name], MD, PhD Hematology Oncology [facility] Pager- [phone] 05/14/2018

## 2018-05-15 LAB — PROTEIN ELECTROPHORESIS, SERUM
A/G Ratio: 1.2 (ref 0.7–1.7)
Albumin ELP: 3.2 g/dL (ref 2.9–4.4)
Alpha-1-Globulin: 0.2 g/dL (ref 0.0–0.4)
Alpha-2-Globulin: 0.6 g/dL (ref 0.4–1.0)
BETA GLOBULIN: 1 g/dL (ref 0.7–1.3)
Gamma Globulin: 0.7 g/dL (ref 0.4–1.8)
Globulin, Total: 2.6 g/dL (ref 2.2–3.9)
M-Spike, %: 0.2 g/dL — ABNORMAL HIGH
Total Protein ELP: 5.8 g/dL — ABNORMAL LOW (ref 6.0–8.5)

## 2018-05-27 ENCOUNTER — Other Ambulatory Visit: Payer: Self-pay

## 2018-05-27 ENCOUNTER — Encounter: Payer: Self-pay | Admitting: Radiation Oncology

## 2018-05-27 ENCOUNTER — Ambulatory Visit
Admission: RE | Admit: 2018-05-27 | Discharge: 2018-05-27 | Disposition: A | Discharge status: Home / Self Care | Payer: PPO | Source: Ambulatory Visit | Attending: Radiation Oncology | Admitting: Radiation Oncology

## 2018-05-27 VITALS — BP 133/83 | HR 80 | Temp 95.7°F | Resp 16 | Wt 229.4 lb

## 2018-05-27 DIAGNOSIS — Z9221 Personal history of antineoplastic chemotherapy: Secondary | ICD-10-CM | POA: Diagnosis not present

## 2018-05-27 DIAGNOSIS — F17210 Nicotine dependence, cigarettes, uncomplicated: Secondary | ICD-10-CM | POA: Insufficient documentation

## 2018-05-27 DIAGNOSIS — C3492 Malignant neoplasm of unspecified part of left bronchus or lung: Secondary | ICD-10-CM

## 2018-05-27 DIAGNOSIS — C3412 Malignant neoplasm of upper lobe, left bronchus or lung: Secondary | ICD-10-CM | POA: Insufficient documentation

## 2018-05-27 DIAGNOSIS — Z923 Personal history of irradiation: Secondary | ICD-10-CM | POA: Insufficient documentation

## 2018-05-27 DIAGNOSIS — R5383 Other fatigue: Secondary | ICD-10-CM | POA: Insufficient documentation

## 2018-05-27 DIAGNOSIS — C778 Secondary and unspecified malignant neoplasm of lymph nodes of multiple regions: Secondary | ICD-10-CM | POA: Diagnosis not present

## 2018-05-27 NOTE — Progress Notes (Signed)
Radiation Oncology Follow up Note  Name: Marc Gray   Date:   05/27/2018 MRN:  [id number] DOB: 07-Jun-1945    This 72 y.o. male presents to the clinic today for 1 year follow-up status post concurrent chemoradiation for stage IIIB adenocarcinoma the left lung.  REFERRING PROVIDER: [name], MD  HPI: patient is a 72 year old male now seen out 1 year having completed concurrentchemotherapy. He isCurrently on Durvalumab maintenance.he is seen today in routine follow-up and is doing fairly well still somewhat fatigued. Has a mild cough no hemoptysis or chest tightness. His most recent CT scan was back in October showed no specific findings worrisome for recurrent or persistent disease. He has architectural distortion left upper lobe consistent with radiation change.  COMPLICATIONS OF TREATMENT: none  FOLLOW UP COMPLIANCE: keeps appointments   PHYSICAL EXAM:  BP 133/83 (BP Location: Left Arm, Patient Position: Sitting)   Pulse 80   Temp (!) 95.7 F (35.4 C) (Tympanic)   Resp 16   Wt 229 lb 6.2 oz (104.1 kg)   BMI 30.26 kg/m  Well-developed well-nourished patient in NAD. HEENT reveals PERLA, EOMI, discs not visualized.  Oral cavity is clear. No oral mucosal lesions are identified. Neck is clear without evidence of cervical or supraclavicular adenopathy. Lungs are clear to A&P. Cardiac examination is essentially unremarkable with regular rate and rhythm without murmur rub or thrill. Abdomen is benign with no organomegaly or masses noted. Motor sensory and DTR levels are equal and symmetric in the upper and lower extremities. Cranial nerves II through XII are grossly intact. Proprioception is intact. No peripheral adenopathy or edema is identified. No motor or sensory levels are noted. Crude visual fields are within normal range.  RADIOLOGY RESULTS: CT scans reviewed and compatible with the above-stated findings  PLAN: present time patient is doing well with no evidence of  disease. I'm please was overall progress. He continues close follow-up care andCurrently on Durvalumab maintenanceI have asked to see him back in 1 year for follow-up. Patient is to call at anytime with any concerns.  I would like to take this opportunity to thank you for allowing me to participate in the care of your patient.Noreene Filbert, MD

## 2018-05-28 ENCOUNTER — Inpatient Hospital Stay: Payer: PPO

## 2018-05-28 ENCOUNTER — Inpatient Hospital Stay (HOSPITAL_BASED_OUTPATIENT_CLINIC_OR_DEPARTMENT_OTHER): Payer: PPO | Admitting: Oncology

## 2018-05-28 ENCOUNTER — Other Ambulatory Visit: Payer: Self-pay | Admitting: Oncology

## 2018-05-28 ENCOUNTER — Other Ambulatory Visit: Payer: Self-pay

## 2018-05-28 ENCOUNTER — Encounter: Payer: Self-pay | Admitting: Oncology

## 2018-05-28 VITALS — BP 128/82 | HR 73 | Temp 96.5°F | Resp 18 | Wt 227.1 lb

## 2018-05-28 DIAGNOSIS — R5383 Other fatigue: Secondary | ICD-10-CM

## 2018-05-28 DIAGNOSIS — N184 Chronic kidney disease, stage 4 (severe): Secondary | ICD-10-CM | POA: Diagnosis not present

## 2018-05-28 DIAGNOSIS — Z79899 Other long term (current) drug therapy: Secondary | ICD-10-CM

## 2018-05-28 DIAGNOSIS — Z87891 Personal history of nicotine dependence: Secondary | ICD-10-CM

## 2018-05-28 DIAGNOSIS — X58XXXS Exposure to other specified factors, sequela: Secondary | ICD-10-CM | POA: Diagnosis not present

## 2018-05-28 DIAGNOSIS — E039 Hypothyroidism, unspecified: Secondary | ICD-10-CM

## 2018-05-28 DIAGNOSIS — R778 Other specified abnormalities of plasma proteins: Secondary | ICD-10-CM

## 2018-05-28 DIAGNOSIS — C3492 Malignant neoplasm of unspecified part of left bronchus or lung: Secondary | ICD-10-CM | POA: Diagnosis not present

## 2018-05-28 DIAGNOSIS — E2740 Unspecified adrenocortical insufficiency: Secondary | ICD-10-CM | POA: Diagnosis not present

## 2018-05-28 DIAGNOSIS — I313 Pericardial effusion (noninflammatory): Secondary | ICD-10-CM

## 2018-05-28 DIAGNOSIS — T50905A Adverse effect of unspecified drugs, medicaments and biological substances, initial encounter: Secondary | ICD-10-CM

## 2018-05-28 DIAGNOSIS — I2510 Atherosclerotic heart disease of native coronary artery without angina pectoris: Secondary | ICD-10-CM | POA: Diagnosis not present

## 2018-05-28 DIAGNOSIS — N400 Enlarged prostate without lower urinary tract symptoms: Secondary | ICD-10-CM

## 2018-05-28 DIAGNOSIS — Z5112 Encounter for antineoplastic immunotherapy: Secondary | ICD-10-CM | POA: Diagnosis not present

## 2018-05-28 DIAGNOSIS — R635 Abnormal weight gain: Secondary | ICD-10-CM

## 2018-05-28 DIAGNOSIS — I129 Hypertensive chronic kidney disease with stage 1 through stage 4 chronic kidney disease, or unspecified chronic kidney disease: Secondary | ICD-10-CM | POA: Diagnosis not present

## 2018-05-28 DIAGNOSIS — E032 Hypothyroidism due to medicaments and other exogenous substances: Secondary | ICD-10-CM

## 2018-05-28 DIAGNOSIS — Z86010 Personal history of colonic polyps: Secondary | ICD-10-CM

## 2018-05-28 DIAGNOSIS — N183 Chronic kidney disease, stage 3 (moderate): Secondary | ICD-10-CM

## 2018-05-28 DIAGNOSIS — S22000S Wedge compression fracture of unspecified thoracic vertebra, sequela: Secondary | ICD-10-CM

## 2018-05-28 DIAGNOSIS — Z9221 Personal history of antineoplastic chemotherapy: Secondary | ICD-10-CM | POA: Diagnosis not present

## 2018-05-28 DIAGNOSIS — M1990 Unspecified osteoarthritis, unspecified site: Secondary | ICD-10-CM

## 2018-05-28 DIAGNOSIS — F418 Other specified anxiety disorders: Secondary | ICD-10-CM

## 2018-05-28 DIAGNOSIS — D631 Anemia in chronic kidney disease: Secondary | ICD-10-CM

## 2018-05-28 DIAGNOSIS — Z7952 Long term (current) use of systemic steroids: Secondary | ICD-10-CM

## 2018-05-28 DIAGNOSIS — N189 Chronic kidney disease, unspecified: Secondary | ICD-10-CM

## 2018-05-28 DIAGNOSIS — Z85038 Personal history of other malignant neoplasm of large intestine: Secondary | ICD-10-CM

## 2018-05-28 DIAGNOSIS — Z7982 Long term (current) use of aspirin: Secondary | ICD-10-CM

## 2018-05-28 LAB — CBC WITH DIFFERENTIAL/PLATELET
Abs Immature Granulocytes: 0.06 10*3/uL (ref 0.00–0.07)
Basophils Absolute: 0 10*3/uL (ref 0.0–0.1)
Basophils Relative: 1 %
EOS PCT: 2 %
Eosinophils Absolute: 0.2 10*3/uL (ref 0.0–0.5)
HCT: 42.4 % (ref 39.0–52.0)
HEMOGLOBIN: 13.4 g/dL (ref 13.0–17.0)
Immature Granulocytes: 1 %
LYMPHS PCT: 9 %
Lymphs Abs: 0.7 10*3/uL (ref 0.7–4.0)
MCH: 29.3 pg (ref 26.0–34.0)
MCHC: 31.6 g/dL (ref 30.0–36.0)
MCV: 92.8 fL (ref 80.0–100.0)
Monocytes Absolute: 0.7 10*3/uL (ref 0.1–1.0)
Monocytes Relative: 9 %
Neutro Abs: 6 10*3/uL (ref 1.7–7.7)
Neutrophils Relative %: 78 %
Platelets: 166 10*3/uL (ref 150–400)
RBC: 4.57 MIL/uL (ref 4.22–5.81)
RDW: 15 % (ref 11.5–15.5)
WBC: 7.7 10*3/uL (ref 4.0–10.5)
nRBC: 0 % (ref 0.0–0.2)

## 2018-05-28 LAB — COMPREHENSIVE METABOLIC PANEL
ALT: 26 U/L (ref 0–44)
AST: 22 U/L (ref 15–41)
Albumin: 3.5 g/dL (ref 3.5–5.0)
Alkaline Phosphatase: 52 U/L (ref 38–126)
Anion gap: 8 (ref 5–15)
BUN: 29 mg/dL — ABNORMAL HIGH (ref 8–23)
CO2: 26 mmol/L (ref 22–32)
CREATININE: 1.42 mg/dL — AB (ref 0.61–1.24)
Calcium: 9.3 mg/dL (ref 8.9–10.3)
Chloride: 106 mmol/L (ref 98–111)
GFR calc Af Amer: 57 mL/min — ABNORMAL LOW (ref >60)
GFR calc non Af Amer: 49 mL/min — ABNORMAL LOW (ref >60)
Glucose, Bld: 115 mg/dL — ABNORMAL HIGH (ref 70–99)
Potassium: 3.8 mmol/L (ref 3.5–5.1)
Sodium: 140 mmol/L (ref 135–145)
Total Bilirubin: 0.6 mg/dL (ref 0.3–1.2)
Total Protein: 6.2 g/dL — ABNORMAL LOW (ref 6.5–8.1)

## 2018-05-28 MED ORDER — HEPARIN SOD (PORK) LOCK FLUSH 100 UNIT/ML IV SOLN
500.0000 [IU] | Freq: Once | INTRAVENOUS | Status: AC
  Administered 2018-05-28: 500 [IU] via INTRAVENOUS

## 2018-05-28 MED ORDER — FLUTICASONE PROPIONATE 50 MCG/ACT NA SUSP: 1.0000 | Freq: Every day | NASAL | 2 refills | Status: DC

## 2018-05-28 MED ORDER — LEVOTHYROXINE SODIUM 25 MCG PO TABS: 25.0000 ug | ORAL_TABLET | Freq: Every day | ORAL | 1 refills | Status: DC

## 2018-05-28 MED ORDER — SODIUM CHLORIDE 0.9 % IV SOLN
1000.0000 mg | Freq: Once | INTRAVENOUS | Status: AC
  Administered 2018-05-28: 1000 mg via INTRAVENOUS
  Filled 2018-05-28: qty 20

## 2018-05-28 MED ORDER — TIOTROPIUM BROMIDE MONOHYDRATE 18 MCG IN CAPS: ORAL_CAPSULE | RESPIRATORY_TRACT | 0 refills | Status: DC

## 2018-05-28 MED ORDER — LEVOTHYROXINE SODIUM 150 MCG PO TABS: 150.0000 ug | ORAL_TABLET | Freq: Every day | ORAL | 0 refills | Status: DC

## 2018-05-28 MED ORDER — SODIUM CHLORIDE 0.9 % IV SOLN
Freq: Once | INTRAVENOUS | Status: AC
  Administered 2018-05-28: 10:00:00 via INTRAVENOUS
  Filled 2018-05-28: qty 250

## 2018-05-28 MED ORDER — SODIUM CHLORIDE 0.9% FLUSH
10.0000 mL | Freq: Once | INTRAVENOUS | Status: AC
  Administered 2018-05-28: 10 mL via INTRAVENOUS
  Filled 2018-05-28: qty 10

## 2018-05-28 NOTE — Progress Notes (Signed)
Hematology/Oncology Follow Up Note [facility] [phone] [phone]   Patient Care Team: [name], MD as PCP - General (Family Medicine) [name], [name], MD (General Surgery) [name], MD as Consulting Physician (Oncology) [name], RN as Registered Nurse  Reason for Visit:  Follow up for lung cancer, immunotherapy side effects, adrenal insufficiency.  HISTORY OF PRESENTING ILLNESS:  Marc Gray 72 y.o.  male who presents for treatment of Stage IIIB (cT2b, cN3, cM0)  Adenocarcinoma of lung.  # Patient is s/p [place] and taxol concurrent RT for treatment of Stage IIIB Lung cancer. He recieved additional lung boost RT Currently on Durvalumab maintenance.   # Colon CA was listed in his history, however reviewing his surgical pathology from colonoscopy on 04/15/2014, during which he had polyps removed and all are negative for cancer.patient is scheduled for colonoscopy screening this year.  # 5/1/ 2019 CT scan was discussed at tumor board and changes consistent with radiation changes  INTERVAL HISTORY Patient he presents for follow-up of for assessment prior to immunotherapy maintenance for stage IIIB adenocarcinoma of the lung.  #Reports feeling well.  No additional nausea vomiting diarrhea episodes.  Symptoms has resolved. #Adrenal insufficiency, on prednisone 10 mg in a.m. and 5 mg in p.m. Blood pressure has been stable. #Acquired hypothyroidism, Synthroid was elevated to 175 MCG a few weeks ago, recent TSH repeat showed normalized TSH #Chronic shortness of breath, with exertion, no change.  He follows up with Dr. Ubaldo Glassing, awaiting for cardiac MRI to be done for further evaluation. Chronic fatigue at baseline.  Review of Systems  Constitutional: Positive for fatigue. Negative for appetite change, chills, fever and unexpected weight change.  HENT:   Negative for hearing loss and voice change.   Eyes: Negative for  eye problems and icterus.  Respiratory: Positive for shortness of breath. Negative for chest tightness and cough.   Cardiovascular: Negative for chest pain and leg swelling.  Gastrointestinal: Negative for abdominal distention and abdominal pain.  Endocrine: Negative for hot flashes.  Genitourinary: Negative for difficulty urinating, dysuria and frequency.   Musculoskeletal: Negative for arthralgias.  Skin: Negative for itching and rash.  Neurological: Negative for light-headedness and numbness.  Hematological: Negative for adenopathy. Does not bruise/bleed easily.  Psychiatric/Behavioral: Negative for confusion.    MEDICAL HISTORY:  Past Medical History:  Diagnosis Date  . Anxiety   . Arthritis    SHOULDER  . Cataract   . Colon cancer ([place]) 2015   Pt states during his colonoscopy he had cancerous polyps removed.   . Depression    SINCE DIAGNOSIS  . Dyspnea    DOE  . Enlarged prostate   . Hypertension   . Lung cancer [facility])    Aug 2018  . Pain    DUE TO LUNG ISSUE    SURGICAL HISTORY: Past Surgical History:  Procedure Laterality Date  . CATARACT EXTRACTION W/ INTRAOCULAR LENS  IMPLANT, BILATERAL    . COLONOSCOPY    . EYE SURGERY    . LYMPH GLAND EXCISION N/A 02/09/2017   Procedure: CERVICAL LYMPH NODE BIOPSY;  Surgeon: Robert Bellow, MD;  Location: [place] [facility];  Service: General;  Laterality: N/A;  . PORTACATH PLACEMENT Right 02/23/2017   Procedure: INSERTION PORT-A-CATH;  Surgeon: Robert Bellow, MD;  Location: [facility];  Service: General;  Laterality: Right;  . PROSTATE SURGERY    . TONSILLECTOMY      SOCIAL HISTORY: Social History   Socioeconomic History  . Marital  status: Married    Spouse name: Not on file  . Number of children: Not on file  . Years of education: Not on file  . Highest education level: Not on file  Occupational History  . Not on file  Social Needs  . Financial resource strain: Not on file  . Food insecurity:    Worry: Not on file      Inability: Not on file  . Transportation needs:    Medical: Not on file    Non-medical: Not on file  Tobacco Use  . Smoking status: Former Smoker    Packs/day: 1.00    Years: 53.00    Pack years: 53.00    Types: Cigarettes    Last attempt to quit: 04/14/2017    Years since quitting: 1.1  . Smokeless tobacco: Never Used  . Tobacco comment: Quit November 2018  Substance and Sexual Activity  . Alcohol use: Yes    Comment: occassional  . Drug use: No  . Sexual activity: Yes  Lifestyle  . Physical activity:    Days per week: Not on file    Minutes per session: Not on file  . Stress: Not on file  Relationships  . Social connections:    Talks on phone: Not on file    Gets together: Not on file    Attends religious service: Not on file    Active member of club or organization: Not on file    Attends meetings of clubs or organizations: Not on file    Relationship status: Not on file  . Intimate partner violence:    Fear of current or ex partner: Not on file    Emotionally abused: Not on file    Physically abused: Not on file    Forced sexual activity: Not on file  Other Topics Concern  . Not on file  Social History Narrative  . Not on file    FAMILY HISTORY: Family History  Problem Relation Age of Onset  . Breast cancer Maternal Grandmother   . Dementia Mother   . Diabetes Father   . Stroke Father     ALLERGIES:  has No Known Allergies.  MEDICATIONS:  Current Outpatient Medications  Medication Sig Dispense Refill  . aspirin 325 MG tablet Take 650 mg by mouth daily.     . budesonide-formoterol (SYMBICORT) 160-4.5 MCG/ACT inhaler Inhale 2 puffs into the lungs 2 (two) times daily. 1 Inhaler 0  . calcium-vitamin D (OSCAL WITH D) 500-200 MG-UNIT tablet Take 1 tablet by mouth daily. 90 tablet 1  . Chlorpheniramine-Phenylephrine (SINUS & ALLERGY) 4-10 MG tablet Take 1 tablet by mouth daily as needed.     . diphenhydrAMINE-zinc acetate (BENADRYL EXTRA STRENGTH) cream  Apply 1 application topically 3 (three) times daily as needed for itching. 28 g 0  . lidocaine-prilocaine (EMLA) cream Apply over port 1-2 hours prior to chemotherapy treatment.  Cover with plastic wrap. 30 g 1  . Melatonin 10 MG TABS Take 10 mg by mouth at bedtime as needed.    . Multiple Vitamin (MULTI-VITAMINS) TABS Take 1 tablet by mouth daily.     . ondansetron (ZOFRAN) 8 MG tablet Take 1 tablet (8 mg total) by mouth every 12 (twelve) hours as needed for nausea or vomiting. 60 tablet 1  . pantoprazole (PROTONIX) 40 MG tablet Take 1 tablet (40 mg total) by mouth daily. 90 tablet 1  . predniSONE (DELTASONE) 10 MG tablet Take one tablet each morning. Take one-half tablet each afternoon (take  between 2 - 5 PM) 45 tablet 0  . PROAIR HFA 108 (90 Base) MCG/ACT inhaler INHALE 2 PUFFS BY MOUTH EVERY 6 HOURS AS NEEDED 8.5 g 0  . prochlorperazine (COMPAZINE) 10 MG tablet Take 1 tablet (10 mg total) by mouth every 6 (six) hours as needed for nausea or vomiting. 60 tablet 2  . tamsulosin (FLOMAX) 0.4 MG CAPS capsule TAKE 1 CAPSULE(0.4 MG) BY MOUTH DAILY 90 capsule 0  . triamcinolone ointment (KENALOG) 0.5 % Apply 1 application topically 3 (three) times daily as needed. 30 g 1  . fluticasone (FLONASE) 50 MCG/ACT nasal spray Place 1 spray into both nostrils daily. 16 g 2  . levothyroxine (SYNTHROID) 150 MCG tablet Take 1 tablet (150 mcg total) by mouth daily before breakfast. 30 tablet 0  . levothyroxine (SYNTHROID, LEVOTHROID) 25 MCG tablet Take 1 tablet (25 mcg total) by mouth daily before breakfast. 30 tablet 1  . tiotropium (SPIRIVA HANDIHALER) 18 MCG inhalation capsule INHALE 1 CAPSULE DAILY 30 capsule 0   No current facility-administered medications for this visit.    Facility-Administered Medications Ordered in Other Visits  Medication Dose Route Frequency Provider Last Rate Last Dose  . sodium chloride flush (NS) 0.9 % injection 10 mL  10 mL Intravenous PRN [name], MD   10 mL at 04/03/17 0354       .  PHYSICAL EXAMINATION: ECOG PERFORMANCE STATUS: 1 - Symptomatic but completely ambulatory Vitals:   05/28/18 0854 05/28/18 0856  BP: 128/82   Pulse: 73   Resp: 18   Temp: (!) 96.5 F (35.8 C)   SpO2:  93%   Filed Weights   05/28/18 0854  Weight: 227 lb 1.6 oz (103 kg)  Physical Exam  Constitutional: He is oriented to person, place, and time. No distress.  HENT:  Head: Normocephalic and atraumatic.  Right Ear: External ear normal.  Left Ear: External ear normal.  Nose: Nose normal.  Mouth/Throat: Oropharynx is clear and moist. No oropharyngeal exudate.  Eyes: Pupils are equal, round, and reactive to light. EOM are normal. No scleral icterus.  Neck: Normal range of motion. Neck supple.  Cardiovascular: Normal rate, regular rhythm and normal heart sounds.  No murmur heard. Pulmonary/Chest: Effort normal. No respiratory distress. He has no wheezes. He has no rales. He exhibits no tenderness.  Decreased sound bilaterally lower lobe.   Abdominal: Soft. Bowel sounds are normal. He exhibits no distension. There is no abdominal tenderness.  Musculoskeletal: Normal range of motion.        General: No tenderness or edema.  Lymphadenopathy:    He has no cervical adenopathy.  Neurological: He is alert and oriented to person, place, and time. No cranial nerve deficit. He exhibits normal muscle tone. Coordination normal.  Skin: Skin is warm and dry. No rash noted. He is not diaphoretic. No erythema.  Psychiatric: Memory, affect and judgment normal.   LABORATORY DATA:  I have reviewed the data as listed Lab Results  Component Value Date   WBC 7.7 05/28/2018   HGB 13.4 05/28/2018   HCT 42.4 05/28/2018   MCV 92.8 05/28/2018   PLT 166 05/28/2018   Recent Labs    04/30/18 0850 05/14/18 0848 05/28/18 0841  NA 140 140 140  K 3.7 3.8 3.8  CL 105 108 106  CO2 26 27 26   GLUCOSE 116* 109* 115*  BUN 31* 25* 29*  CREATININE 1.45* 1.37* 1.42*  CALCIUM 9.1 8.7* 9.3   GFRNONAA 48* 52* 49*  GFRAA 56* 60*  57*  PROT 6.6 6.3* 6.2*  ALBUMIN 3.6 3.4* 3.5  AST 19 33 22  ALT 20 37 26  ALKPHOS 52 40 52  BILITOT 0.4 0.4 0.6   RADIOGRAPHIC STUDIES: I have personally reviewed the radiological images as listed and agreed with the findings in the report.  12/17/2017  CAT scan was independently reviewed and discussed with patient.  Image showed a stable masslike consolidation in the left upper lobe with evolving radiation therapy changes in the medial left hemithorax.  Subpleural right upper lobe nodule is unchanged.  Small /pericardial effusion, slightly increased from prior. Patient had a new T2 superior endplate compression fracture. Aortic atherosclerosis, three-vessel coronary artery calcification.  Ascending aortic aneurysm.  Emphysema.  03/15/2018 CT chest without contrast  showed nonspecific findings identified highly worrisome for recurrent tumor or metastatic disease. Posttreatment changes within the left lobe.  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 Aortic atherosclerosis and emphysema. Multivessel coronary artery atherosclerotic calcifications.  ASSESSMENT & PLAN:  Cancer Staging Adenocarcinoma of left lung, stage 3 (HCC) Staging form: Lung, AJCC 8th Edition - Clinical stage from 02/14/2017: Stage IIIB (cT2b, cN3, cM0) - Signed by [name], MD on 02/14/2017  1. Adenocarcinoma of left lung, stage 3 ([place])   2. Hypothyroidism due to medication   3. Weight gain due to medication   4. Encounter for antineoplastic immunotherapy   5. Adrenal insufficiency ([place])   6. Chronic kidney disease, unspecified CKD stage   7. Anemia in stage 3 chronic kidney disease ([place])    #Stage IIIB lung adenocarcinoma,  Labs reviewed and discussed with patient.  Counts acceptable to proceed with today's durvalumab.  #Adrenal insufficiency, continues prednisone at current regimen. #Hypothyroidism due to immunotherapy, continue Synthroid 175 MCG  daily.  TSH normalized. Patient request to have 25 MCG Synthroid prescription called in to be used together with his supply of 150 MCG daily.  #T2 endplate compression fracture.  Continue calcium and vitamin D supplements.  Bone density was normal. #Multivessel coronary artery atherosclerosis/pleural effusion undergoing work-up with Dr. Ubaldo Glassing.  Awaiting cardiac MRI.  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  Will order additional lab work-up at next visit. # BPH,continue Flomax.. Encourage patient to discuss with primary care physician.   We spent sufficient time to discuss many aspect of care, questions were answered to patient's satisfaction. Total face to face encounter time for this patient visit was25 min. >50% of the time was  spent in counseling and coordination of care.   Follow-up in 2 weeks prior to next immunotherapy treatment. [name], MD, PhD Hematology Oncology [facility] Pager- [phone] 05/28/2018

## 2018-05-28 NOTE — Progress Notes (Signed)
Patient here for follow up. Requesting refill on spriva and fluticasone nasal spray.

## 2018-06-04 ENCOUNTER — Other Ambulatory Visit (HOSPITAL_COMMUNITY): Payer: Self-pay | Admitting: Cardiology

## 2018-06-04 DIAGNOSIS — R079 Chest pain, unspecified: Secondary | ICD-10-CM

## 2018-06-11 ENCOUNTER — Encounter: Payer: Self-pay | Admitting: Oncology

## 2018-06-11 ENCOUNTER — Other Ambulatory Visit: Payer: Self-pay

## 2018-06-11 ENCOUNTER — Inpatient Hospital Stay: Payer: PPO

## 2018-06-11 ENCOUNTER — Inpatient Hospital Stay (HOSPITAL_BASED_OUTPATIENT_CLINIC_OR_DEPARTMENT_OTHER): Payer: PPO | Admitting: Oncology

## 2018-06-11 ENCOUNTER — Inpatient Hospital Stay: Payer: PPO | Attending: Oncology

## 2018-06-11 VITALS — BP 113/74 | HR 70 | Temp 95.6°F | Resp 18 | Ht 73.0 in | Wt 230.0 lb

## 2018-06-11 DIAGNOSIS — E032 Hypothyroidism due to medicaments and other exogenous substances: Secondary | ICD-10-CM | POA: Diagnosis not present

## 2018-06-11 DIAGNOSIS — Z9221 Personal history of antineoplastic chemotherapy: Secondary | ICD-10-CM | POA: Diagnosis not present

## 2018-06-11 DIAGNOSIS — I2510 Atherosclerotic heart disease of native coronary artery without angina pectoris: Secondary | ICD-10-CM | POA: Diagnosis not present

## 2018-06-11 DIAGNOSIS — Z79899 Other long term (current) drug therapy: Secondary | ICD-10-CM | POA: Insufficient documentation

## 2018-06-11 DIAGNOSIS — R0609 Other forms of dyspnea: Secondary | ICD-10-CM

## 2018-06-11 DIAGNOSIS — N401 Enlarged prostate with lower urinary tract symptoms: Secondary | ICD-10-CM | POA: Insufficient documentation

## 2018-06-11 DIAGNOSIS — R5382 Chronic fatigue, unspecified: Secondary | ICD-10-CM | POA: Diagnosis not present

## 2018-06-11 DIAGNOSIS — R778 Other specified abnormalities of plasma proteins: Secondary | ICD-10-CM

## 2018-06-11 DIAGNOSIS — C3492 Malignant neoplasm of unspecified part of left bronchus or lung: Secondary | ICD-10-CM

## 2018-06-11 DIAGNOSIS — F418 Other specified anxiety disorders: Secondary | ICD-10-CM | POA: Insufficient documentation

## 2018-06-11 DIAGNOSIS — D631 Anemia in chronic kidney disease: Secondary | ICD-10-CM | POA: Insufficient documentation

## 2018-06-11 DIAGNOSIS — M1990 Unspecified osteoarthritis, unspecified site: Secondary | ICD-10-CM

## 2018-06-11 DIAGNOSIS — Z7982 Long term (current) use of aspirin: Secondary | ICD-10-CM | POA: Diagnosis not present

## 2018-06-11 DIAGNOSIS — Z87891 Personal history of nicotine dependence: Secondary | ICD-10-CM | POA: Insufficient documentation

## 2018-06-11 DIAGNOSIS — N183 Chronic kidney disease, stage 3 (moderate): Secondary | ICD-10-CM

## 2018-06-11 DIAGNOSIS — Z87311 Personal history of (healed) other pathological fracture: Secondary | ICD-10-CM | POA: Insufficient documentation

## 2018-06-11 DIAGNOSIS — Z5112 Encounter for antineoplastic immunotherapy: Secondary | ICD-10-CM | POA: Diagnosis not present

## 2018-06-11 DIAGNOSIS — I129 Hypertensive chronic kidney disease with stage 1 through stage 4 chronic kidney disease, or unspecified chronic kidney disease: Secondary | ICD-10-CM | POA: Diagnosis not present

## 2018-06-11 DIAGNOSIS — S22000A Wedge compression fracture of unspecified thoracic vertebra, initial encounter for closed fracture: Secondary | ICD-10-CM

## 2018-06-11 DIAGNOSIS — R3914 Feeling of incomplete bladder emptying: Secondary | ICD-10-CM

## 2018-06-11 DIAGNOSIS — R635 Abnormal weight gain: Secondary | ICD-10-CM | POA: Insufficient documentation

## 2018-06-11 DIAGNOSIS — T50905A Adverse effect of unspecified drugs, medicaments and biological substances, initial encounter: Secondary | ICD-10-CM

## 2018-06-11 DIAGNOSIS — N189 Chronic kidney disease, unspecified: Secondary | ICD-10-CM

## 2018-06-11 DIAGNOSIS — E2740 Unspecified adrenocortical insufficiency: Secondary | ICD-10-CM | POA: Diagnosis not present

## 2018-06-11 LAB — CBC WITH DIFFERENTIAL/PLATELET
Abs Immature Granulocytes: 0.05 10*3/uL (ref 0.00–0.07)
Basophils Absolute: 0.1 10*3/uL (ref 0.0–0.1)
Basophils Relative: 1 %
Eosinophils Absolute: 0.3 10*3/uL (ref 0.0–0.5)
Eosinophils Relative: 3 %
HCT: 41.6 % (ref 39.0–52.0)
HEMOGLOBIN: 13.3 g/dL (ref 13.0–17.0)
Immature Granulocytes: 1 %
LYMPHS ABS: 1.1 10*3/uL (ref 0.7–4.0)
Lymphocytes Relative: 14 %
MCH: 29.6 pg (ref 26.0–34.0)
MCHC: 32 g/dL (ref 30.0–36.0)
MCV: 92.4 fL (ref 80.0–100.0)
MONOS PCT: 10 %
Monocytes Absolute: 0.8 10*3/uL (ref 0.1–1.0)
Neutro Abs: 6 10*3/uL (ref 1.7–7.7)
Neutrophils Relative %: 71 %
Platelets: 188 10*3/uL (ref 150–400)
RBC: 4.5 MIL/uL (ref 4.22–5.81)
RDW: 15 % (ref 11.5–15.5)
WBC: 8.3 10*3/uL (ref 4.0–10.5)
nRBC: 0 % (ref 0.0–0.2)

## 2018-06-11 LAB — COMPREHENSIVE METABOLIC PANEL
ALBUMIN: 3.5 g/dL (ref 3.5–5.0)
ALT: 26 U/L (ref 0–44)
AST: 19 U/L (ref 15–41)
Alkaline Phosphatase: 56 U/L (ref 38–126)
Anion gap: 8 (ref 5–15)
BUN: 32 mg/dL — ABNORMAL HIGH (ref 8–23)
CO2: 27 mmol/L (ref 22–32)
CREATININE: 1.38 mg/dL — AB (ref 0.61–1.24)
Calcium: 9 mg/dL (ref 8.9–10.3)
Chloride: 104 mmol/L (ref 98–111)
GFR calc Af Amer: 59 mL/min — ABNORMAL LOW (ref >60)
GFR calc non Af Amer: 51 mL/min — ABNORMAL LOW (ref >60)
Glucose, Bld: 110 mg/dL — ABNORMAL HIGH (ref 70–99)
Potassium: 3.8 mmol/L (ref 3.5–5.1)
SODIUM: 139 mmol/L (ref 135–145)
Total Bilirubin: 0.4 mg/dL (ref 0.3–1.2)
Total Protein: 6.3 g/dL — ABNORMAL LOW (ref 6.5–8.1)

## 2018-06-11 MED ORDER — BUDESONIDE-FORMOTEROL FUMARATE 160-4.5 MCG/ACT IN AERO: 2.0000 | INHALATION_SPRAY | Freq: Two times a day (BID) | RESPIRATORY_TRACT | 1 refills | Status: DC

## 2018-06-11 MED ORDER — HEPARIN SOD (PORK) LOCK FLUSH 100 UNIT/ML IV SOLN
500.0000 [IU] | Freq: Once | INTRAVENOUS | Status: AC
  Administered 2018-06-11: 500 [IU] via INTRAVENOUS
  Filled 2018-06-11: qty 5

## 2018-06-11 MED ORDER — SODIUM CHLORIDE 0.9 % IV SOLN
Freq: Once | INTRAVENOUS | Status: AC
  Administered 2018-06-11: 11:00:00 via INTRAVENOUS
  Filled 2018-06-11: qty 250

## 2018-06-11 MED ORDER — TAMSULOSIN HCL 0.4 MG PO CAPS: 0.4000 mg | ORAL_CAPSULE | Freq: Every day | ORAL | 0 refills | Status: DC

## 2018-06-11 MED ORDER — SODIUM CHLORIDE 0.9 % IV SOLN
1000.0000 mg | Freq: Once | INTRAVENOUS | Status: AC
  Administered 2018-06-11: 1000 mg via INTRAVENOUS
  Filled 2018-06-11: qty 20

## 2018-06-11 MED ORDER — SODIUM CHLORIDE 0.9% FLUSH
10.0000 mL | INTRAVENOUS | Status: DC | PRN
  Administered 2018-06-11: 10 mL via INTRAVENOUS
  Filled 2018-06-11: qty 10

## 2018-06-11 NOTE — Progress Notes (Signed)
Patient here today for follow up.  Patient c/o of hard stools with BM.

## 2018-06-11 NOTE — Progress Notes (Signed)
Hematology/Oncology Follow Up Note [facility] Telephone:([phone] [phone]   Patient Care Team: [name], MD as PCP - General (Family Medicine) [name], [name], MD (General Surgery) [name], MD as Consulting Physician (Oncology) [name], RN as Registered Nurse  Reason for Visit:  Follow up for lung cancer, immunotherapy side effects, adrenal insufficiency.  HISTORY OF PRESENTING ILLNESS:  Marc Gray 73 y.o.  male who presents for treatment of Stage IIIB (cT2b, cN3, cM0)  Adenocarcinoma of lung.  # Patient is s/p [place] and taxol concurrent RT for treatment of Stage IIIB Lung cancer. He recieved additional lung boost RT Currently on Durvalumab maintenance.   # Colon CA was listed in his history, however reviewing his surgical pathology from colonoscopy on 04/15/2014, during which he had polyps removed and all are negative for cancer.patient is scheduled for colonoscopy screening this year.  # 5/1/ 2019 CT scan was discussed at tumor board and changes consistent with radiation changes  INTERVAL HISTORY Patient he presents for follow-up of for assessment prior to immunotherapy maintenance for stage IIIB adenocarcinoma of the lung.  #Reports feeling well.  Continue to gain weight.  Gained 3 pounds since 2 weeks ago #Adrenal insufficiency, on prednisone 10 mg in a.m. and 5 mg in p.m.  Blood pressure has been stable. #Acquired hypothyroidism, Synthroid has been adjusted to 175 MCG a few weeks ago.  Recent TSH repeat showed normalized TSH.  Today's TSH are pending.  #Chronic shortness of breath, with exertion.  At baseline.  He has establish care with Dr. Ubaldo Glassing awaiting cardiac MRI to be done for further evaluation. #Chronic fatigue at baseline.   Review of Systems  Constitutional: Positive for fatigue. Negative for appetite change, chills, fever and unexpected weight change.  HENT:   Negative for hearing loss and  voice change.   Eyes: Negative for eye problems and icterus.  Respiratory: Positive for shortness of breath. Negative for chest tightness and cough.   Cardiovascular: Negative for chest pain and leg swelling.  Gastrointestinal: Negative for abdominal distention and abdominal pain.  Endocrine: Negative for hot flashes.  Genitourinary: Negative for difficulty urinating, dysuria and frequency.   Musculoskeletal: Negative for arthralgias.  Skin: Negative for itching and rash.  Neurological: Negative for light-headedness and numbness.  Hematological: Negative for adenopathy. Does not bruise/bleed easily.  Psychiatric/Behavioral: Negative for confusion.    MEDICAL HISTORY:  Past Medical History:  Diagnosis Date  . Anxiety   . Arthritis    SHOULDER  . Cataract   . Colon cancer ([place]) 2015   Pt states during his colonoscopy he had cancerous polyps removed.   . Depression    SINCE DIAGNOSIS  . Dyspnea    DOE  . Enlarged prostate   . Hypertension   . Lung cancer [facility])    Aug 2018  . Pain    DUE TO LUNG ISSUE    SURGICAL HISTORY: Past Surgical History:  Procedure Laterality Date  . CATARACT EXTRACTION W/ INTRAOCULAR LENS  IMPLANT, BILATERAL    . COLONOSCOPY    . EYE SURGERY    . LYMPH GLAND EXCISION N/A 02/09/2017   Procedure: CERVICAL LYMPH NODE BIOPSY;  Surgeon: Robert Bellow, MD;  Location: [facility];  Service: General;  Laterality: N/A;  . PORTACATH PLACEMENT Right 02/23/2017   Procedure: INSERTION PORT-A-CATH;  Surgeon: Robert Bellow, MD;  Location: [facility];  Service: General;  Laterality: Right;  . PROSTATE SURGERY    . TONSILLECTOMY  SOCIAL HISTORY: Social History   Socioeconomic History  . Marital status: Married    Spouse name: Not on file  . Number of children: Not on file  . Years of education: Not on file  . Highest education level: Not on file  Occupational History  . Not on file  Social Needs  . Financial resource strain: Not on file  . Food  insecurity:    Worry: Not on file    Inability: Not on file  . Transportation needs:    Medical: Not on file    Non-medical: Not on file  Tobacco Use  . Smoking status: Former Smoker    Packs/day: 1.00    Years: 53.00    Pack years: 53.00    Types: Cigarettes    Last attempt to quit: 04/14/2017    Years since quitting: 1.1  . Smokeless tobacco: Never Used  . Tobacco comment: Quit November 2018  Substance and Sexual Activity  . Alcohol use: Yes    Comment: occassional  . Drug use: No  . Sexual activity: Yes  Lifestyle  . Physical activity:    Days per week: Not on file    Minutes per session: Not on file  . Stress: Not on file  Relationships  . Social connections:    Talks on phone: Not on file    Gets together: Not on file    Attends religious service: Not on file    Active member of club or organization: Not on file    Attends meetings of clubs or organizations: Not on file    Relationship status: Not on file  . Intimate partner violence:    Fear of current or ex partner: Not on file    Emotionally abused: Not on file    Physically abused: Not on file    Forced sexual activity: Not on file  Other Topics Concern  . Not on file  Social History Narrative  . Not on file    FAMILY HISTORY: Family History  Problem Relation Age of Onset  . Breast cancer Maternal Grandmother   . Dementia Mother   . Diabetes Father   . Stroke Father     ALLERGIES:  has No Known Allergies.  MEDICATIONS:  Current Outpatient Medications  Medication Sig Dispense Refill  . aspirin 325 MG tablet Take 650 mg by mouth daily.     . budesonide-formoterol (SYMBICORT) 160-4.5 MCG/ACT inhaler Inhale 2 puffs into the lungs 2 (two) times daily. 1 Inhaler 1  . calcium-vitamin D (OSCAL WITH D) 500-200 MG-UNIT tablet Take 1 tablet by mouth daily. 90 tablet 1  . Chlorpheniramine-Phenylephrine (SINUS & ALLERGY) 4-10 MG tablet Take 1 tablet by mouth daily as needed.     . diphenhydrAMINE-zinc  acetate (BENADRYL EXTRA STRENGTH) cream Apply 1 application topically 3 (three) times daily as needed for itching. 28 g 0  . fluticasone (FLONASE) 50 MCG/ACT nasal spray Place 1 spray into both nostrils daily. 16 g 2  . levothyroxine (SYNTHROID) 150 MCG tablet Take 1 tablet (150 mcg total) by mouth daily before breakfast. 30 tablet 0  . levothyroxine (SYNTHROID, LEVOTHROID) 25 MCG tablet TAKE 1 TABLET(25 MCG) BY MOUTH DAILY BEFORE BREAKFAST 90 tablet 0  . lidocaine-prilocaine (EMLA) cream Apply over port 1-2 hours prior to chemotherapy treatment.  Cover with plastic wrap. 30 g 1  . Melatonin 10 MG TABS Take 10 mg by mouth at bedtime as needed.    . Multiple Vitamin (MULTI-VITAMINS) TABS Take 1 tablet by mouth  daily.     . ondansetron (ZOFRAN) 8 MG tablet Take 1 tablet (8 mg total) by mouth every 12 (twelve) hours as needed for nausea or vomiting. 60 tablet 1  . pantoprazole (PROTONIX) 40 MG tablet Take 1 tablet (40 mg total) by mouth daily. 90 tablet 1  . predniSONE (DELTASONE) 10 MG tablet Take one tablet each morning. Take one-half tablet each afternoon (take between 2 - 5 PM) 45 tablet 0  . PROAIR HFA 108 (90 Base) MCG/ACT inhaler INHALE 2 PUFFS BY MOUTH EVERY 6 HOURS AS NEEDED 8.5 g 0  . prochlorperazine (COMPAZINE) 10 MG tablet Take 1 tablet (10 mg total) by mouth every 6 (six) hours as needed for nausea or vomiting. 60 tablet 2  . tamsulosin (FLOMAX) 0.4 MG CAPS capsule TAKE 1 CAPSULE(0.4 MG) BY MOUTH DAILY 90 capsule 0  . tiotropium (SPIRIVA HANDIHALER) 18 MCG inhalation capsule INHALE 1 CAPSULE DAILY 30 capsule 0  . triamcinolone ointment (KENALOG) 0.5 % Apply 1 application topically 3 (three) times daily as needed. 30 g 1   No current facility-administered medications for this visit.    Facility-Administered Medications Ordered in Other Visits  Medication Dose Route Frequency Provider Last Rate Last Dose  . sodium chloride flush (NS) 0.9 % injection 10 mL  10 mL Intravenous PRN [name],  MD   10 mL at 04/03/17 8850  . sodium chloride flush (NS) 0.9 % injection 10 mL  10 mL Intravenous PRN [name], MD   10 mL at 06/11/18 0915      .  PHYSICAL EXAMINATION: ECOG PERFORMANCE STATUS: 1 - Symptomatic but completely ambulatory Vitals:   06/11/18 0942  BP: 113/74  Pulse: 70  Resp: 18  Temp: (!) 95.6 F (35.3 C)   Filed Weights   06/11/18 0942  Weight: 230 lb (104.3 kg)  Physical Exam  Constitutional: He is oriented to person, place, and time. No distress.  HENT:  Head: Normocephalic and atraumatic.  Right Ear: External ear normal.  Left Ear: External ear normal.  Nose: Nose normal.  Mouth/Throat: Oropharynx is clear and moist. No oropharyngeal exudate.  Eyes: Pupils are equal, round, and reactive to light. EOM are normal. No scleral icterus.  Neck: Normal range of motion. Neck supple.  Cardiovascular: Normal rate, regular rhythm and normal heart sounds.  No murmur heard. Pulmonary/Chest: Effort normal. No respiratory distress. He has no wheezes. He has no rales. He exhibits no tenderness.  Decreased sound bilaterally lower lobe.   Abdominal: Soft. Bowel sounds are normal. He exhibits no distension. There is no abdominal tenderness.  Musculoskeletal: Normal range of motion.        General: No tenderness or edema.  Lymphadenopathy:    He has no cervical adenopathy.  Neurological: He is alert and oriented to person, place, and time. No cranial nerve deficit. He exhibits normal muscle tone. Coordination normal.  Skin: Skin is warm and dry. No rash noted. He is not diaphoretic. No erythema.  Psychiatric: Memory, affect and judgment normal.   LABORATORY DATA:  I have reviewed the data as listed Lab Results  Component Value Date   WBC 8.3 06/11/2018   HGB 13.3 06/11/2018   HCT 41.6 06/11/2018   MCV 92.4 06/11/2018   PLT 188 06/11/2018   Recent Labs    05/14/18 0848 05/28/18 0841 06/11/18 0908  NA 140 140 139  K 3.8 3.8 3.8  CL 108 106 104  CO2 27 26 27     GLUCOSE 109* 115* 110*  BUN 25* 29* 32*  CREATININE 1.37* 1.42* 1.38*  CALCIUM 8.7* 9.3 9.0  GFRNONAA 52* 49* 51*  GFRAA 60* 57* 59*  PROT 6.3* 6.2* 6.3*  ALBUMIN 3.4* 3.5 3.5  AST 33 22 19  ALT 37 26 26  ALKPHOS 40 52 56  BILITOT 0.4 0.6 0.4   RADIOGRAPHIC STUDIES: I have personally reviewed the radiological images as listed and agreed with the findings in the report.  12/17/2017  CAT scan was independently reviewed and discussed with patient.  Image showed a stable masslike consolidation in the left upper lobe with evolving radiation therapy changes in the medial left hemithorax.  Subpleural right upper lobe nodule is unchanged.  Small /pericardial effusion, slightly increased from prior. Patient had a new T2 superior endplate compression fracture. Aortic atherosclerosis, three-vessel coronary artery calcification.  Ascending aortic aneurysm.  Emphysema.  03/15/2018 CT chest without contrast  showed nonspecific findings identified highly worrisome for recurrent tumor or metastatic disease. Posttreatment changes within the left lobe.  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 Aortic atherosclerosis and emphysema. Multivessel coronary artery atherosclerotic calcifications.  ASSESSMENT & PLAN:  Cancer Staging Adenocarcinoma of left lung, stage 3 (HCC) Staging form: Lung, AJCC 8th Edition - Clinical stage from 02/14/2017: Stage IIIB (cT2b, cN3, cM0) - Signed by [name], MD on 02/14/2017  1. Adenocarcinoma of left lung, stage 3 ([place])   2. Hypothyroidism due to medication   3. Weight gain due to medication   4. Encounter for antineoplastic immunotherapy   5. Adrenal insufficiency ([place])   6. Chronic kidney disease, unspecified CKD stage   7. Abnormal SPEP   8. Compression fracture of body of thoracic vertebra (HCC)   9. Benign prostatic hyperplasia with incomplete bladder emptying    #Stage IIIB lung adenocarcinoma,  Tolerates immunotherapy well.  Labs  reviewed and discussed with patient. Counts acceptable to proceed with today's durvalumab treatment.  #Adrenal insufficiency, continue prednisone at current regimen. #Hypothyroidism due to immunotherapy, continue Synthroid 175 MCG daily.  Today's TSH is pending. Patient request to have 25 MCG Synthroid prescription called in to be used together with his supply of 150 MCG daily.  #T2 endplate compression fracture.  Continue calcium vitamin D supplements.  Bone density was normal. #Multivessel coronary artery atherosclerosis/pleural effusion undergoing work-up with Dr. Ubaldo Glassing.  Awaiting cardiac MRI #Weight gain continue to be a problem for patient.  Steroid-induced.  Recommend patient to calculate calorie intake and improve physical activity.  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  Checking free light chain ratio today.  Results pending.   # BPH,continue Flomax.. Encourage patient to discuss with primary care physician.  Patient requests refilling Flomax and Symbicort.  Rx sent to pharmacy.  Recommend patient to follow-up with primary care physician for additional refills.  Follow-up in 2 weeks prior to next immunotherapy treatment  [name], MD, PhD Hematology Oncology [facility] Pager- [phone] 06/11/2018

## 2018-06-12 LAB — KAPPA/LAMBDA LIGHT CHAINS
Kappa free light chain: 55 mg/L — ABNORMAL HIGH (ref 3.3–19.4)
Kappa, lambda light chain ratio: 1.84 — ABNORMAL HIGH (ref 0.26–1.65)
Lambda free light chains: 29.9 mg/L — ABNORMAL HIGH (ref 5.7–26.3)

## 2018-06-12 LAB — THYROID PANEL WITH TSH
Free Thyroxine Index: 2.7 (ref 1.2–4.9)
T3 UPTAKE RATIO: 31 % (ref 24–39)
T4, Total: 8.7 ug/dL (ref 4.5–12.0)
TSH: 3.33 u[IU]/mL (ref 0.450–4.500)

## 2018-06-13 ENCOUNTER — Telehealth (HOSPITAL_COMMUNITY): Payer: Self-pay | Admitting: Emergency Medicine

## 2018-06-13 NOTE — Telephone Encounter (Signed)
Reaching out to patient to offer assistance regarding upcoming cardiac imaging study; pt verbalizes understanding of appt date/time, parking situation and where to check in, and verified current allergies; name and call back number provided for further questions should they arise  Patient with port a cath, will place emla cream prior to leaving home  [place] [phone]

## 2018-06-14 ENCOUNTER — Ambulatory Visit (HOSPITAL_COMMUNITY)
Admission: RE | Admit: 2018-06-14 | Discharge: 2018-06-14 | Disposition: A | Discharge status: Home / Self Care | Payer: PPO | Source: Ambulatory Visit | Attending: Cardiology | Admitting: Cardiology

## 2018-06-14 DIAGNOSIS — R079 Chest pain, unspecified: Secondary | ICD-10-CM

## 2018-06-14 MED ORDER — GADOBUTROL 1 MMOL/ML IV SOLN
12.0000 mL | Freq: Once | INTRAVENOUS | Status: AC | PRN
  Administered 2018-06-14: 12 mL via INTRAVENOUS

## 2018-06-14 MED ORDER — HEPARIN SOD (PORK) LOCK FLUSH 100 UNIT/ML IV SOLN
500.0000 [IU] | INTRAVENOUS | Status: AC | PRN
  Administered 2018-06-14: 500 [IU]

## 2018-06-25 ENCOUNTER — Inpatient Hospital Stay: Payer: PPO

## 2018-06-25 ENCOUNTER — Other Ambulatory Visit: Payer: Self-pay

## 2018-06-25 ENCOUNTER — Encounter: Payer: Self-pay | Admitting: Oncology

## 2018-06-25 ENCOUNTER — Inpatient Hospital Stay (HOSPITAL_BASED_OUTPATIENT_CLINIC_OR_DEPARTMENT_OTHER): Payer: PPO | Admitting: Oncology

## 2018-06-25 VITALS — BP 123/74 | HR 77 | Temp 96.9°F | Resp 18 | Wt 232.0 lb

## 2018-06-25 DIAGNOSIS — Z87311 Personal history of (healed) other pathological fracture: Secondary | ICD-10-CM

## 2018-06-25 DIAGNOSIS — E2740 Unspecified adrenocortical insufficiency: Secondary | ICD-10-CM | POA: Diagnosis not present

## 2018-06-25 DIAGNOSIS — Z79899 Other long term (current) drug therapy: Secondary | ICD-10-CM

## 2018-06-25 DIAGNOSIS — N183 Chronic kidney disease, stage 3 unspecified: Secondary | ICD-10-CM

## 2018-06-25 DIAGNOSIS — R5382 Chronic fatigue, unspecified: Secondary | ICD-10-CM

## 2018-06-25 DIAGNOSIS — M1990 Unspecified osteoarthritis, unspecified site: Secondary | ICD-10-CM

## 2018-06-25 DIAGNOSIS — N401 Enlarged prostate with lower urinary tract symptoms: Secondary | ICD-10-CM

## 2018-06-25 DIAGNOSIS — Z5112 Encounter for antineoplastic immunotherapy: Secondary | ICD-10-CM | POA: Diagnosis not present

## 2018-06-25 DIAGNOSIS — D631 Anemia in chronic kidney disease: Secondary | ICD-10-CM | POA: Diagnosis not present

## 2018-06-25 DIAGNOSIS — R0609 Other forms of dyspnea: Secondary | ICD-10-CM | POA: Diagnosis not present

## 2018-06-25 DIAGNOSIS — C3490 Malignant neoplasm of unspecified part of unspecified bronchus or lung: Secondary | ICD-10-CM

## 2018-06-25 DIAGNOSIS — E032 Hypothyroidism due to medicaments and other exogenous substances: Secondary | ICD-10-CM

## 2018-06-25 DIAGNOSIS — R635 Abnormal weight gain: Secondary | ICD-10-CM | POA: Diagnosis not present

## 2018-06-25 DIAGNOSIS — E039 Hypothyroidism, unspecified: Secondary | ICD-10-CM

## 2018-06-25 DIAGNOSIS — I2510 Atherosclerotic heart disease of native coronary artery without angina pectoris: Secondary | ICD-10-CM

## 2018-06-25 DIAGNOSIS — Z87891 Personal history of nicotine dependence: Secondary | ICD-10-CM

## 2018-06-25 DIAGNOSIS — C3492 Malignant neoplasm of unspecified part of left bronchus or lung: Secondary | ICD-10-CM

## 2018-06-25 DIAGNOSIS — Z7982 Long term (current) use of aspirin: Secondary | ICD-10-CM

## 2018-06-25 DIAGNOSIS — I129 Hypertensive chronic kidney disease with stage 1 through stage 4 chronic kidney disease, or unspecified chronic kidney disease: Secondary | ICD-10-CM

## 2018-06-25 DIAGNOSIS — F418 Other specified anxiety disorders: Secondary | ICD-10-CM

## 2018-06-25 LAB — COMPREHENSIVE METABOLIC PANEL
ALT: 28 U/L (ref 0–44)
AST: 22 U/L (ref 15–41)
Albumin: 3.5 g/dL (ref 3.5–5.0)
Alkaline Phosphatase: 51 U/L (ref 38–126)
Anion gap: 8 (ref 5–15)
BUN: 28 mg/dL — ABNORMAL HIGH (ref 8–23)
CALCIUM: 8.6 mg/dL — AB (ref 8.9–10.3)
CO2: 26 mmol/L (ref 22–32)
CREATININE: 1.38 mg/dL — AB (ref 0.61–1.24)
Chloride: 106 mmol/L (ref 98–111)
GFR calc Af Amer: 59 mL/min — ABNORMAL LOW (ref >60)
GFR calc non Af Amer: 51 mL/min — ABNORMAL LOW (ref >60)
Glucose, Bld: 109 mg/dL — ABNORMAL HIGH (ref 70–99)
Potassium: 3.8 mmol/L (ref 3.5–5.1)
Sodium: 140 mmol/L (ref 135–145)
Total Bilirubin: 0.6 mg/dL (ref 0.3–1.2)
Total Protein: 6.5 g/dL (ref 6.5–8.1)

## 2018-06-25 LAB — TSH: TSH: 1.883 u[IU]/mL (ref 0.350–4.500)

## 2018-06-25 LAB — CBC WITH DIFFERENTIAL/PLATELET
Abs Immature Granulocytes: 0.05 10*3/uL (ref 0.00–0.07)
Basophils Absolute: 0 10*3/uL (ref 0.0–0.1)
Basophils Relative: 1 %
Eosinophils Absolute: 0.2 10*3/uL (ref 0.0–0.5)
Eosinophils Relative: 3 %
HCT: 41.9 % (ref 39.0–52.0)
Hemoglobin: 13.3 g/dL (ref 13.0–17.0)
Immature Granulocytes: 1 %
Lymphocytes Relative: 17 %
Lymphs Abs: 1.2 10*3/uL (ref 0.7–4.0)
MCH: 29.4 pg (ref 26.0–34.0)
MCHC: 31.7 g/dL (ref 30.0–36.0)
MCV: 92.7 fL (ref 80.0–100.0)
MONOS PCT: 11 %
Monocytes Absolute: 0.8 10*3/uL (ref 0.1–1.0)
Neutro Abs: 4.7 10*3/uL (ref 1.7–7.7)
Neutrophils Relative %: 67 %
Platelets: 180 10*3/uL (ref 150–400)
RBC: 4.52 MIL/uL (ref 4.22–5.81)
RDW: 15.2 % (ref 11.5–15.5)
WBC: 6.9 10*3/uL (ref 4.0–10.5)
nRBC: 0 % (ref 0.0–0.2)

## 2018-06-25 MED ORDER — HEPARIN SOD (PORK) LOCK FLUSH 100 UNIT/ML IV SOLN
500.0000 [IU] | Freq: Once | INTRAVENOUS | Status: AC | PRN
  Administered 2018-06-25: 500 [IU]
  Filled 2018-06-25: qty 5

## 2018-06-25 MED ORDER — SODIUM CHLORIDE 0.9 % IV SOLN
1000.0000 mg | Freq: Once | INTRAVENOUS | Status: AC
  Administered 2018-06-25: 1000 mg via INTRAVENOUS
  Filled 2018-06-25: qty 20

## 2018-06-25 MED ORDER — CALCIUM CARBONATE-VITAMIN D 500-200 MG-UNIT PO TABS: 1.0000 | ORAL_TABLET | Freq: Every day | ORAL | 1 refills | Status: AC

## 2018-06-25 MED ORDER — SODIUM CHLORIDE 0.9 % IV SOLN
Freq: Once | INTRAVENOUS | Status: AC
  Administered 2018-06-25: 10:00:00 via INTRAVENOUS
  Filled 2018-06-25: qty 250

## 2018-06-25 NOTE — Progress Notes (Signed)
Patient here for follow up. Still gets SOB easily.

## 2018-06-25 NOTE — Progress Notes (Signed)
Hematology/Oncology Follow Up Note [facility] Telephone:([phone] [phone]   Patient Care Team: [name], MD as PCP - General (Family Medicine) [name], [name], MD (General Surgery) [name], MD as Consulting Physician (Oncology) [name], RN as Registered Nurse  Reason for Visit:  Follow up for lung cancer, immunotherapy side effects, adrenal insufficiency.  HISTORY OF PRESENTING ILLNESS:  Marc Gray 73 y.o.  male who presents for treatment of Stage IIIB (cT2b, cN3, cM0)  Adenocarcinoma of lung.  # Patient is s/p [place] and taxol concurrent RT for treatment of Stage IIIB Lung cancer. He recieved additional lung boost RT Currently on Durvalumab maintenance.   # Colon CA was listed in his history, however reviewing his surgical pathology from colonoscopy on 04/15/2014, during which he had polyps removed and all are negative for cancer.patient is scheduled for colonoscopy screening this year.  # 5/1/ 2019 CT scan was discussed at tumor board and changes consistent with radiation changes  INTERVAL HISTORY Patient he presents for follow-up of for assessment prior to immunotherapy maintenance for stage IIIB adenocarcinoma of the lung.  #Reports feeling at baseline.  Continue to gain weight.  He has gained 2 pounds since 2 weeks ago. #Adrenal insufficiency, on prednisone 10 mg a.m. and 5 mg in p.m.  Blood pressure has been stable. #Acquired hypothyroidism, Synthroid at 175 MCG.  Today's TSH pending.  #Chronic shortness of breath, with exertion.  At baseline.   During interval patient has had a cardiac MRI done which showed normal LV size and function EF 57% mild RV enlargement.  No infarct, scar, infiltration.  No pericardial effusion  #Chronic fatigue at baseline.   Review of Systems  Constitutional: Positive for fatigue. Negative for appetite change, chills, fever and unexpected weight change.  HENT:   Negative for  hearing loss and voice change.   Eyes: Negative for eye problems and icterus.  Respiratory: Positive for shortness of breath. Negative for chest tightness and cough.   Cardiovascular: Negative for chest pain and leg swelling.  Gastrointestinal: Negative for abdominal distention and abdominal pain.  Endocrine: Negative for hot flashes.  Genitourinary: Negative for difficulty urinating, dysuria and frequency.   Musculoskeletal: Negative for arthralgias.  Skin: Negative for itching and rash.  Neurological: Negative for light-headedness and numbness.  Hematological: Negative for adenopathy. Does not bruise/bleed easily.  Psychiatric/Behavioral: Negative for confusion.    MEDICAL HISTORY:  Past Medical History:  Diagnosis Date  . Anxiety   . Arthritis    SHOULDER  . Cataract   . Colon cancer ([place]) 2015   Pt states during his colonoscopy he had cancerous polyps removed.   . Depression    SINCE DIAGNOSIS  . Dyspnea    DOE  . Enlarged prostate   . Hypertension   . Lung cancer [facility])    Aug 2018  . Pain    DUE TO LUNG ISSUE    SURGICAL HISTORY: Past Surgical History:  Procedure Laterality Date  . CATARACT EXTRACTION W/ INTRAOCULAR LENS  IMPLANT, BILATERAL    . COLONOSCOPY    . EYE SURGERY    . LYMPH GLAND EXCISION N/A 02/09/2017   Procedure: CERVICAL LYMPH NODE BIOPSY;  Surgeon: Robert Bellow, MD;  Location: [facility];  Service: General;  Laterality: N/A;  . PORTACATH PLACEMENT Right 02/23/2017   Procedure: INSERTION PORT-A-CATH;  Surgeon: Robert Bellow, MD;  Location: [facility];  Service: General;  Laterality: Right;  . PROSTATE SURGERY    . TONSILLECTOMY  SOCIAL HISTORY: Social History   Socioeconomic History  . Marital status: Married    Spouse name: Not on file  . Number of children: Not on file  . Years of education: Not on file  . Highest education level: Not on file  Occupational History  . Not on file  Social Needs  . Financial resource strain: Not  on file  . Food insecurity:    Worry: Not on file    Inability: Not on file  . Transportation needs:    Medical: Not on file    Non-medical: Not on file  Tobacco Use  . Smoking status: Former Smoker    Packs/day: 1.00    Years: 53.00    Pack years: 53.00    Types: Cigarettes    Last attempt to quit: 04/14/2017    Years since quitting: 1.1  . Smokeless tobacco: Never Used  . Tobacco comment: Quit November 2018  Substance and Sexual Activity  . Alcohol use: Yes    Comment: occassional  . Drug use: No  . Sexual activity: Yes  Lifestyle  . Physical activity:    Days per week: Not on file    Minutes per session: Not on file  . Stress: Not on file  Relationships  . Social connections:    Talks on phone: Not on file    Gets together: Not on file    Attends religious service: Not on file    Active member of club or organization: Not on file    Attends meetings of clubs or organizations: Not on file    Relationship status: Not on file  . Intimate partner violence:    Fear of current or ex partner: Not on file    Emotionally abused: Not on file    Physically abused: Not on file    Forced sexual activity: Not on file  Other Topics Concern  . Not on file  Social History Narrative  . Not on file    FAMILY HISTORY: Family History  Problem Relation Age of Onset  . Breast cancer Maternal Grandmother   . Dementia Mother   . Diabetes Father   . Stroke Father     ALLERGIES:  has No Known Allergies.  MEDICATIONS:  Current Outpatient Medications  Medication Sig Dispense Refill  . aspirin 325 MG tablet Take 650 mg by mouth daily.     . budesonide-formoterol (SYMBICORT) 160-4.5 MCG/ACT inhaler Inhale 2 puffs into the lungs 2 (two) times daily. 1 Inhaler 1  . calcium-vitamin D (OSCAL WITH D) 500-200 MG-UNIT tablet Take 1 tablet by mouth daily. 90 tablet 1  . Chlorpheniramine-Phenylephrine (SINUS & ALLERGY) 4-10 MG tablet Take 1 tablet by mouth daily as needed.     .  diphenhydrAMINE-zinc acetate (BENADRYL EXTRA STRENGTH) cream Apply 1 application topically 3 (three) times daily as needed for itching. 28 g 0  . fluticasone (FLONASE) 50 MCG/ACT nasal spray Place 1 spray into both nostrils daily. 16 g 2  . levothyroxine (SYNTHROID) 150 MCG tablet Take 1 tablet (150 mcg total) by mouth daily before breakfast. 30 tablet 0  . levothyroxine (SYNTHROID, LEVOTHROID) 25 MCG tablet TAKE 1 TABLET(25 MCG) BY MOUTH DAILY BEFORE BREAKFAST 90 tablet 0  . lidocaine-prilocaine (EMLA) cream Apply over port 1-2 hours prior to chemotherapy treatment.  Cover with plastic wrap. 30 g 1  . Melatonin 10 MG TABS Take 10 mg by mouth at bedtime as needed.    . Multiple Vitamin (MULTI-VITAMINS) TABS Take 1 tablet by mouth  daily.     . ondansetron (ZOFRAN) 8 MG tablet Take 1 tablet (8 mg total) by mouth every 12 (twelve) hours as needed for nausea or vomiting. 60 tablet 1  . pantoprazole (PROTONIX) 40 MG tablet Take 1 tablet (40 mg total) by mouth daily. 90 tablet 1  . predniSONE (DELTASONE) 10 MG tablet Take one tablet each morning. Take one-half tablet each afternoon (take between 2 - 5 PM) 45 tablet 0  . PROAIR HFA 108 (90 Base) MCG/ACT inhaler INHALE 2 PUFFS BY MOUTH EVERY 6 HOURS AS NEEDED 8.5 g 0  . prochlorperazine (COMPAZINE) 10 MG tablet Take 1 tablet (10 mg total) by mouth every 6 (six) hours as needed for nausea or vomiting. 60 tablet 2  . tamsulosin (FLOMAX) 0.4 MG CAPS capsule Take 1 capsule (0.4 mg total) by mouth daily. 90 capsule 0  . tiotropium (SPIRIVA HANDIHALER) 18 MCG inhalation capsule INHALE 1 CAPSULE DAILY 30 capsule 0  . triamcinolone ointment (KENALOG) 0.5 % Apply 1 application topically 3 (three) times daily as needed. 30 g 1   No current facility-administered medications for this visit.    Facility-Administered Medications Ordered in Other Visits  Medication Dose Route Frequency Provider Last Rate Last Dose  . sodium chloride flush (NS) 0.9 % injection 10 mL  10  mL Intravenous PRN [name], MD   10 mL at 04/03/17 7253      .  PHYSICAL EXAMINATION: ECOG PERFORMANCE STATUS: 1 - Symptomatic but completely ambulatory Vitals:   06/25/18 0910  BP: 123/74  Pulse: 77  Resp: 18  Temp: (!) 96.9 F (36.1 C)  SpO2: 97%   Filed Weights   06/25/18 0910  Weight: 232 lb (105.2 kg)  Physical Exam  Constitutional: He is oriented to person, place, and time. No distress.  HENT:  Head: Normocephalic and atraumatic.  Right Ear: External ear normal.  Left Ear: External ear normal.  Nose: Nose normal.  Mouth/Throat: Oropharynx is clear and moist. No oropharyngeal exudate.  Eyes: Pupils are equal, round, and reactive to light. EOM are normal. No scleral icterus.  Neck: Normal range of motion. Neck supple.  Cardiovascular: Normal rate, regular rhythm and normal heart sounds.  No murmur heard. Pulmonary/Chest: Effort normal. No respiratory distress. He has no wheezes. He has no rales. He exhibits no tenderness.  Decreased sound bilaterally lower lobe.   Abdominal: Soft. Bowel sounds are normal. He exhibits no distension. There is no abdominal tenderness.  Musculoskeletal: Normal range of motion.        General: No tenderness or edema.  Lymphadenopathy:    He has no cervical adenopathy.  Neurological: He is alert and oriented to person, place, and time. No cranial nerve deficit. He exhibits normal muscle tone. Coordination normal.  Skin: Skin is warm and dry. No rash noted. He is not diaphoretic. No erythema.  Psychiatric: Memory, affect and judgment normal.   LABORATORY DATA:  I have reviewed the data as listed Lab Results  Component Value Date   WBC 6.9 06/25/2018   HGB 13.3 06/25/2018   HCT 41.9 06/25/2018   MCV 92.7 06/25/2018   PLT 180 06/25/2018   Recent Labs    05/28/18 0841 06/11/18 0908 06/25/18 0843  NA 140 139 140  K 3.8 3.8 3.8  CL 106 104 106  CO2 _0 GLUCOSE 115* 110* 109*  BUN 29* 32* 28*  CREATININE 1.42* 1.38* 1.38*   CALCIUM 9.3 9.0 8.6*  GFRNONAA 49* 51* 51*  GFRAA 57*  59* 59*  PROT 6.2* 6.3* 6.5  ALBUMIN 3.5 3.5 3.5  AST _0 ALT _1 ALKPHOS 52 56 51  BILITOT 0.6 0.4 0.6   RADIOGRAPHIC STUDIES: I have personally reviewed the radiological images as listed and agreed with the findings in the report.  12/17/2017  CAT scan was independently reviewed and discussed with patient.  Image showed a stable masslike consolidation in the left upper lobe with evolving radiation therapy changes in the medial left hemithorax.  Subpleural right upper lobe nodule is unchanged.  Small /pericardial effusion, slightly increased from prior. Patient had a new T2 superior endplate compression fracture. Aortic atherosclerosis, three-vessel coronary artery calcification.  Ascending aortic aneurysm.  Emphysema.  03/15/2018 CT chest without contrast  showed nonspecific findings identified highly worrisome for recurrent tumor or metastatic disease. Posttreatment changes within the left lobe.  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 Aortic atherosclerosis and emphysema. Multivessel coronary artery atherosclerotic calcifications.  ASSESSMENT & PLAN:  Cancer Staging Adenocarcinoma of left lung, stage 3 (HCC) Staging form: Lung, AJCC 8th Edition - Clinical stage from 02/14/2017: Stage IIIB (cT2b, cN3, cM0) - Signed by [name], MD on 02/14/2017  1. Anemia in stage 3 chronic kidney disease ([place])   2. Malignant neoplasm of lung, unspecified laterality, unspecified part of lung ([place])    #Stage IIIB lung adenocarcinoma,  Tolerates immunotherapy well  Labs reviewed and discussed with patient. Counts acceptable to proceed with today's durvalumab treatment. Labs reviewed and discussed with patient.  Counts acceptable proceed with today's durvalumab treatment.  He will be finishing total of 26 cycles of durvalumab after today's treatment. Plan obtain CT chest surveillance image in 3  weeks. Patient to follow-up after CT chest for discussion.  #Adrenal insufficiency, continue prednisone at current regimen.  TSH normalized.  #T2 endplate compression fracture.  Continue calcium vitamin D supplements.  Bone density was normal. #Multivessel coronary artery atherosclerosis/pleural effusion undergoing work-up with Dr. Ubaldo Glassing.  Status post cardiac MRI  #Weight gain continue to be a problem for patient.  Steroid-induced.  Discussed with him again with calorie calculation and increase physical activity.   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  Repeat multiple myeloma panel every 6 months.  Follow-up in 3 to 4 weeks after CT chest surveillance image. We spent sufficient time to discuss many aspect of care, questions were answered to patient's satisfaction. Total face to face encounter time for this patient visit was 25 min. >50% of the time was  spent in counseling and coordination of care.    [name], MD, PhD Hematology Oncology [facility] Pager- [phone] 06/25/2018

## 2018-07-10 DIAGNOSIS — E039 Hypothyroidism, unspecified: Secondary | ICD-10-CM | POA: Diagnosis not present

## 2018-07-10 DIAGNOSIS — E2749 Other adrenocortical insufficiency: Secondary | ICD-10-CM | POA: Diagnosis not present

## 2018-07-12 DIAGNOSIS — M5489 Other dorsalgia: Secondary | ICD-10-CM | POA: Diagnosis not present

## 2018-07-12 DIAGNOSIS — M546 Pain in thoracic spine: Secondary | ICD-10-CM | POA: Diagnosis not present

## 2018-07-17 ENCOUNTER — Ambulatory Visit
Admission: RE | Admit: 2018-07-17 | Discharge: 2018-07-17 | Disposition: A | Discharge status: Home / Self Care | Payer: PPO | Source: Ambulatory Visit | Attending: Oncology | Admitting: Oncology

## 2018-07-17 DIAGNOSIS — E2749 Other adrenocortical insufficiency: Secondary | ICD-10-CM | POA: Diagnosis not present

## 2018-07-17 DIAGNOSIS — C3490 Malignant neoplasm of unspecified part of unspecified bronchus or lung: Secondary | ICD-10-CM | POA: Diagnosis not present

## 2018-07-17 DIAGNOSIS — E039 Hypothyroidism, unspecified: Secondary | ICD-10-CM | POA: Diagnosis not present

## 2018-07-19 ENCOUNTER — Inpatient Hospital Stay: Payer: PPO

## 2018-07-19 ENCOUNTER — Inpatient Hospital Stay: Payer: PPO | Admitting: Oncology

## 2018-07-22 ENCOUNTER — Other Ambulatory Visit: Payer: Self-pay

## 2018-07-22 ENCOUNTER — Encounter: Payer: Self-pay | Admitting: Oncology

## 2018-07-22 ENCOUNTER — Inpatient Hospital Stay: Payer: PPO | Attending: Oncology

## 2018-07-22 ENCOUNTER — Inpatient Hospital Stay (HOSPITAL_BASED_OUTPATIENT_CLINIC_OR_DEPARTMENT_OTHER): Payer: PPO | Admitting: Oncology

## 2018-07-22 VITALS — BP 123/81 | HR 81 | Temp 96.3°F | Resp 18 | Wt 233.9 lb

## 2018-07-22 DIAGNOSIS — D631 Anemia in chronic kidney disease: Secondary | ICD-10-CM

## 2018-07-22 DIAGNOSIS — I129 Hypertensive chronic kidney disease with stage 1 through stage 4 chronic kidney disease, or unspecified chronic kidney disease: Secondary | ICD-10-CM | POA: Insufficient documentation

## 2018-07-22 DIAGNOSIS — N183 Chronic kidney disease, stage 3 (moderate): Secondary | ICD-10-CM

## 2018-07-22 DIAGNOSIS — Z79899 Other long term (current) drug therapy: Secondary | ICD-10-CM

## 2018-07-22 DIAGNOSIS — R5382 Chronic fatigue, unspecified: Secondary | ICD-10-CM | POA: Insufficient documentation

## 2018-07-22 DIAGNOSIS — N400 Enlarged prostate without lower urinary tract symptoms: Secondary | ICD-10-CM

## 2018-07-22 DIAGNOSIS — M1990 Unspecified osteoarthritis, unspecified site: Secondary | ICD-10-CM | POA: Diagnosis not present

## 2018-07-22 DIAGNOSIS — Z87891 Personal history of nicotine dependence: Secondary | ICD-10-CM

## 2018-07-22 DIAGNOSIS — R635 Abnormal weight gain: Secondary | ICD-10-CM

## 2018-07-22 DIAGNOSIS — C3492 Malignant neoplasm of unspecified part of left bronchus or lung: Secondary | ICD-10-CM

## 2018-07-22 DIAGNOSIS — N189 Chronic kidney disease, unspecified: Secondary | ICD-10-CM

## 2018-07-22 DIAGNOSIS — M4854XS Collapsed vertebra, not elsewhere classified, thoracic region, sequela of fracture: Secondary | ICD-10-CM | POA: Insufficient documentation

## 2018-07-22 DIAGNOSIS — E032 Hypothyroidism due to medicaments and other exogenous substances: Secondary | ICD-10-CM

## 2018-07-22 DIAGNOSIS — M546 Pain in thoracic spine: Secondary | ICD-10-CM | POA: Insufficient documentation

## 2018-07-22 DIAGNOSIS — Z923 Personal history of irradiation: Secondary | ICD-10-CM | POA: Diagnosis not present

## 2018-07-22 DIAGNOSIS — G8929 Other chronic pain: Secondary | ICD-10-CM | POA: Insufficient documentation

## 2018-07-22 DIAGNOSIS — E039 Hypothyroidism, unspecified: Secondary | ICD-10-CM

## 2018-07-22 DIAGNOSIS — E2740 Unspecified adrenocortical insufficiency: Secondary | ICD-10-CM | POA: Diagnosis not present

## 2018-07-22 DIAGNOSIS — S22000A Wedge compression fracture of unspecified thoracic vertebra, initial encounter for closed fracture: Secondary | ICD-10-CM

## 2018-07-22 DIAGNOSIS — Z9221 Personal history of antineoplastic chemotherapy: Secondary | ICD-10-CM

## 2018-07-22 DIAGNOSIS — T50905A Adverse effect of unspecified drugs, medicaments and biological substances, initial encounter: Secondary | ICD-10-CM

## 2018-07-22 LAB — CBC WITH DIFFERENTIAL/PLATELET
Abs Immature Granulocytes: 0.06 10*3/uL (ref 0.00–0.07)
Basophils Absolute: 0 10*3/uL (ref 0.0–0.1)
Basophils Relative: 1 %
Eosinophils Absolute: 0.2 10*3/uL (ref 0.0–0.5)
Eosinophils Relative: 4 %
HCT: 45.1 % (ref 39.0–52.0)
Hemoglobin: 14.5 g/dL (ref 13.0–17.0)
Immature Granulocytes: 1 %
LYMPHS ABS: 1.1 10*3/uL (ref 0.7–4.0)
Lymphocytes Relative: 19 %
MCH: 29.5 pg (ref 26.0–34.0)
MCHC: 32.2 g/dL (ref 30.0–36.0)
MCV: 91.9 fL (ref 80.0–100.0)
Monocytes Absolute: 0.7 10*3/uL (ref 0.1–1.0)
Monocytes Relative: 12 %
NRBC: 0 % (ref 0.0–0.2)
Neutro Abs: 3.7 10*3/uL (ref 1.7–7.7)
Neutrophils Relative %: 63 %
Platelets: 186 10*3/uL (ref 150–400)
RBC: 4.91 MIL/uL (ref 4.22–5.81)
RDW: 15.4 % (ref 11.5–15.5)
WBC: 5.8 10*3/uL (ref 4.0–10.5)

## 2018-07-22 LAB — COMPREHENSIVE METABOLIC PANEL
ALT: 26 U/L (ref 0–44)
AST: 20 U/L (ref 15–41)
Albumin: 3.8 g/dL (ref 3.5–5.0)
Alkaline Phosphatase: 67 U/L (ref 38–126)
Anion gap: 9 (ref 5–15)
BUN: 33 mg/dL — ABNORMAL HIGH (ref 8–23)
CO2: 27 mmol/L (ref 22–32)
Calcium: 8.9 mg/dL (ref 8.9–10.3)
Chloride: 102 mmol/L (ref 98–111)
Creatinine, Ser: 1.5 mg/dL — ABNORMAL HIGH (ref 0.61–1.24)
GFR calc non Af Amer: 46 mL/min — ABNORMAL LOW (ref >60)
GFR, EST AFRICAN AMERICAN: 54 mL/min — AB (ref >60)
Glucose, Bld: 96 mg/dL (ref 70–99)
Potassium: 4.1 mmol/L (ref 3.5–5.1)
Sodium: 138 mmol/L (ref 135–145)
Total Bilirubin: 0.6 mg/dL (ref 0.3–1.2)
Total Protein: 6.9 g/dL (ref 6.5–8.1)

## 2018-07-22 LAB — TSH: TSH: 2.515 u[IU]/mL (ref 0.350–4.500)

## 2018-07-22 MED ORDER — TIOTROPIUM BROMIDE MONOHYDRATE 18 MCG IN CAPS: ORAL_CAPSULE | RESPIRATORY_TRACT | 0 refills | Status: DC

## 2018-07-22 MED ORDER — TRAMADOL HCL 50 MG PO TABS: 50.0000 mg | ORAL_TABLET | Freq: Four times a day (QID) | ORAL | 0 refills | Status: DC | PRN

## 2018-07-22 NOTE — Progress Notes (Signed)
Hematology/Oncology Follow Up Note [facility] [phone] [phone]   Patient Care Team: [name], MD as PCP - General (Family Medicine) [name], [name], MD (General Surgery) [name], MD as Consulting Physician (Oncology) [name], RN as Registered Nurse  Reason for Visit:  Follow up for lung cancer, immunotherapy side effects, adrenal insufficiency.  HISTORY OF PRESENTING ILLNESS:  Marc Gray 73 y.o.  male who presents for treatment of Stage IIIB (cT2b, cN3, cM0)  Adenocarcinoma of lung.  # Patient is s/p [place] and taxol concurrent RT for treatment of Stage IIIB Lung cancer. He recieved additional lung boost RT Currently on Durvalumab maintenance.   # Colon CA was listed in his history, however reviewing his surgical pathology from colonoscopy on 04/15/2014, during which he had polyps removed and all are negative for cancer.patient is scheduled for colonoscopy screening this year.  # 5/1/ 2019 CT scan was discussed at tumor board and changes consistent with radiation changes  INTERVAL HISTORY Patient he presents for follow-up of for assessment prior to immunotherapy maintenance for stage IIIB adenocarcinoma of the lung.  # Doing well except feeling back pain, acute on chronic onset since last weekend.  No heavy weight lifting.  Gained one pound since 4 weeks ago.  # Acquired hypothyroidism, synthroid at 143mg daily.  Chronic fatigue at baseline.   Review of Systems  Constitutional: Positive for fatigue. Negative for appetite change, chills, fever and unexpected weight change.  HENT:   Negative for hearing loss and voice change.   Eyes: Negative for eye problems and icterus.  Respiratory: Positive for shortness of breath. Negative for chest tightness and cough.   Cardiovascular: Negative for chest pain and leg swelling.  Gastrointestinal: Negative for abdominal distention, abdominal pain and blood in  stool.  Endocrine: Negative for hot flashes.  Genitourinary: Negative for difficulty urinating, dysuria and frequency.   Musculoskeletal: Positive for back pain. Negative for arthralgias.  Skin: Negative for itching and rash.  Neurological: Negative for extremity weakness, light-headedness and numbness.  Hematological: Negative for adenopathy. Does not bruise/bleed easily.  Psychiatric/Behavioral: Negative for confusion.    MEDICAL HISTORY:  Past Medical History:  Diagnosis Date  . Anxiety   . Arthritis    SHOULDER  . Cataract   . [place]   Pt states during his colonoscopy he had cancerous polyps removed.   . Depression    SINCE DIAGNOSIS  . Dyspnea    DOE  . Enlarged prostate   . Hypertension   . Lung cancer ([facility]    Aug 2018  . Pain    DUE TO LUNG ISSUE    SURGICAL HISTORY: Past Surgical History:  Procedure Laterality Date  . CATARACT EXTRACTION W/ INTRAOCULAR LENS  IMPLANT, BILATERAL    . COLONOSCOPY    . EYE SURGERY    . LYMPH GLAND EXCISION N/A 02/09/2017   Procedure: CERVICAL LYMPH NODE BIOPSY;  Surgeon: BRobert Bellow MD;  [facility];  Service: General;  Laterality: N/A;  . PORTACATH PLACEMENT Right 02/23/2017   Procedure: INSERTION PORT-A-CATH;  Surgeon: BRobert Bellow MD;  Location: [facility];  Service: General;  Laterality: Right;  . PROSTATE SURGERY    . TONSILLECTOMY      SOCIAL HISTORY: Social History   Socioeconomic History  . Marital status: Married    Spouse name: Not on file  . Number of children: Not on file  . Years of education: Not on file  . Highest  education level: Not on file  Occupational History  . Not on file  Social Needs  . Financial resource strain: Not on file  . Food insecurity:    Worry: Not on file    Inability: Not on file  . Transportation needs:    Medical: Not on file    Non-medical: Not on file  Tobacco Use  . Smoking status: Former Smoker    Packs/day: 1.00    Years: 53.00    Pack  years: 53.00    Types: Cigarettes    Last attempt to quit: 04/14/2017    Years since quitting: 1.2  . Smokeless tobacco: Never Used  . Tobacco comment: Quit November 2018  Substance and Sexual Activity  . Alcohol use: Yes    Comment: occassional  . Drug use: No  . Sexual activity: Yes  Lifestyle  . Physical activity:    Days per week: Not on file    Minutes per session: Not on file  . Stress: Not on file  Relationships  . Social connections:    Talks on phone: Not on file    Gets together: Not on file    Attends religious service: Not on file    Active member of club or organization: Not on file    Attends meetings of clubs or organizations: Not on file    Relationship status: Not on file  . Intimate partner violence:    Fear of current or ex partner: Not on file    Emotionally abused: Not on file    Physically abused: Not on file    Forced sexual activity: Not on file  Other Topics Concern  . Not on file  Social History Narrative  . Not on file    FAMILY HISTORY: Family History  Problem Relation Age of Onset  . Breast cancer Maternal Grandmother   . Dementia Mother   . Diabetes Father   . Stroke Father     ALLERGIES:  has No Known Allergies.  MEDICATIONS:  Current Outpatient Medications  Medication Sig Dispense Refill  . aspirin 325 MG tablet Take 650 mg by mouth daily.     . budesonide-formoterol (SYMBICORT) 160-4.5 MCG/ACT inhaler Inhale 2 puffs into the lungs 2 (two) times daily. 1 Inhaler 1  . calcium-vitamin D (OSCAL WITH D) 500-200 MG-UNIT tablet Take 1 tablet by mouth daily. 90 tablet 1  . Chlorpheniramine-Phenylephrine (SINUS & ALLERGY) 4-10 MG tablet Take 1 tablet by mouth daily as needed.     . cyclobenzaprine (FLEXERIL) 5 MG tablet Take by mouth.    . diphenhydrAMINE-zinc acetate (BENADRYL EXTRA STRENGTH) cream Apply 1 application topically 3 (three) times daily as needed for itching. 28 g 0  . fluticasone (FLONASE) 50 MCG/ACT nasal spray Place 1  spray into both nostrils daily. 16 g 2  . levothyroxine (SYNTHROID) 150 MCG tablet Take 1 tablet (150 mcg total) by mouth daily before breakfast. 30 tablet 0  . levothyroxine (SYNTHROID, LEVOTHROID) 25 MCG tablet TAKE 1 TABLET(25 MCG) BY MOUTH DAILY BEFORE BREAKFAST 90 tablet 0  . lidocaine-prilocaine (EMLA) cream Apply over port 1-2 hours prior to chemotherapy treatment.  Cover with plastic wrap. 30 g 1  . Melatonin 10 MG TABS Take 10 mg by mouth at bedtime as needed.    . Multiple Vitamin (MULTI-VITAMINS) TABS Take 1 tablet by mouth daily.     . ondansetron (ZOFRAN) 8 MG tablet Take 1 tablet (8 mg total) by mouth every 12 (twelve) hours as needed for nausea or  vomiting. 60 tablet 1  . pantoprazole (PROTONIX) 40 MG tablet Take 1 tablet (40 mg total) by mouth daily. 90 tablet 1  . predniSONE (DELTASONE) 10 MG tablet Take one tablet each morning. Take one-half tablet each afternoon (take between 2 - 5 PM) 45 tablet 0  . PROAIR HFA 108 (90 Base) MCG/ACT inhaler INHALE 2 PUFFS BY MOUTH EVERY 6 HOURS AS NEEDED 8.5 g 0  . prochlorperazine (COMPAZINE) 10 MG tablet Take 1 tablet (10 mg total) by mouth every 6 (six) hours as needed for nausea or vomiting. 60 tablet 2  . tamsulosin (FLOMAX) 0.4 MG CAPS capsule Take 1 capsule (0.4 mg total) by mouth daily. 90 capsule 0  . tiotropium (SPIRIVA HANDIHALER) 18 MCG inhalation capsule INHALE 1 CAPSULE DAILY 30 capsule 0  . triamcinolone ointment (KENALOG) 0.5 % Apply 1 application topically 3 (three) times daily as needed. 30 g 1  . traMADol (ULTRAM) 50 MG tablet Take 1 tablet (50 mg total) by mouth every 6 (six) hours as needed for moderate pain or severe pain. 30 tablet 0   No current facility-administered medications for this visit.    Facility-Administered Medications Ordered in Other Visits  Medication Dose Route Frequency Provider Last Rate Last Dose  . sodium chloride flush (NS) 0.9 % injection 10 mL  10 mL Intravenous PRN [name], MD   10 mL at 04/03/17  7829      .  PHYSICAL EXAMINATION: ECOG PERFORMANCE STATUS: 1 - Symptomatic but completely ambulatory Vitals:   07/22/18 1317  BP: 123/81  Pulse: 81  Resp: 18  Temp: (!) 96.3 F (35.7 C)   Filed Weights   07/22/18 1317  Weight: 233 lb 14.4 oz (106.1 kg)  Physical Exam  Constitutional: He is oriented to person, place, and time. No distress.  HENT:  Head: Normocephalic and atraumatic.  Right Ear: External ear normal.  Left Ear: External ear normal.  Nose: Nose normal.  Mouth/Throat: Oropharynx is clear and moist. No oropharyngeal exudate.  Eyes: Pupils are equal, round, and reactive to light. EOM are normal. No scleral icterus.  Neck: Normal range of motion. Neck supple.  Cardiovascular: Normal rate, regular rhythm and normal heart sounds.  No murmur heard. Pulmonary/Chest: Effort normal. No respiratory distress. He has no wheezes. He has no rales. He exhibits no tenderness.  Decreased sound bilaterally lower lobe.   Abdominal: Soft. Bowel sounds are normal. He exhibits no distension. There is no abdominal tenderness.  Musculoskeletal: Normal range of motion.        General: No tenderness or edema.  Lymphadenopathy:    He has no cervical adenopathy.  Neurological: He is alert and oriented to person, place, and time. No cranial nerve deficit. He exhibits normal muscle tone. Coordination normal.  Skin: Skin is warm and dry. No rash noted. He is not diaphoretic. No erythema.  Psychiatric: Memory, affect and judgment normal.   LABORATORY DATA:  I have reviewed the data as listed Lab Results  Component Value Date   WBC 5.8 07/22/2018   HGB 14.5 07/22/2018   HCT 45.1 07/22/2018   MCV 91.9 07/22/2018   PLT 186 07/22/2018   Recent Labs    06/11/18 0908 06/25/18 0843 07/22/18 1252  NA 139 140 138  K 3.8 3.8 4.1  CL 104 106 102  CO2 _0 GLUCOSE 110* 109* 96  BUN 32* 28* 33*  CREATININE 1.38* 1.38* 1.50*  CALCIUM 9.0 8.6* 8.9  GFRNONAA 51* 51* 46*  GFRAA 59*  59* 54*  PROT 6.3* 6.5 6.9  ALBUMIN 3.5 3.5 3.8  AST _0 ALT _1 ALKPHOS 56 51 67  BILITOT 0.4 0.6 0.6   RADIOGRAPHIC STUDIES: I have personally reviewed the radiological images as listed and agreed with the findings in the report.  12/17/2017  CAT scan was independently reviewed and discussed with patient.  Image showed a stable masslike consolidation in the left upper lobe with evolving radiation therapy changes in the medial left hemithorax.  Subpleural right upper lobe nodule is unchanged.  Small /pericardial effusion, slightly increased from prior. Patient had a new T2 superior endplate compression fracture. Aortic atherosclerosis, three-vessel coronary artery calcification.  Ascending aortic aneurysm.  Emphysema.  03/15/2018 CT chest without contrast  showed nonspecific findings identified highly worrisome for recurrent tumor or metastatic disease. Posttreatment changes within the left lobe.  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 Aortic atherosclerosis and emphysema. Multivessel coronary artery atherosclerotic calcifications.  ASSESSMENT & PLAN:  Cancer Staging Adenocarcinoma of left lung, stage 3 (HCC) Staging form: Lung, AJCC 8th Edition - Clinical stage from 02/14/2017: Stage IIIB (cT2b, cN3, cM0) - Signed by [name], MD on 02/14/2017  1. Adenocarcinoma of left lung, stage 3 ([place])   2. Acute midline thoracic back pain   3. Anemia in stage 3 chronic kidney disease ([place])   4. Hypothyroidism due to medication   5. Weight gain due to medication   6. Adrenal insufficiency ([place])   7. Chronic kidney disease, unspecified CKD stage   8. Compression fracture of body of thoracic vertebra (HCC)    #Stage IIIB lung adenocarcinoma, s/p concurrent chemo/RT and immunotherapy.  Interim CT scan was independently reviewed and discussed with patient.  Stable disease.   # Acute on chronic back pain, new compression fracture of T4. History of T2  compression fracture.  Chronic steroid use. DEXA showed normal bone density.  Will obtain PET scan, and also MRI thoracic spine for further evaluation.  Also may need orthopedic/neurosurgery evaluation.  Continue calcium vitamin D supplements Tramadol 25m Q6h as needed.   # Hypothyroidism: stable TSH. Will refill Synthroid 1725m  #Adrenal insufficiency, continue prednisone at current regimen.   #Weight gain continue to be a problem for patient.  Steroid-induced.  Discussed with him again with calorie calculation and increase physical activity.  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  Repeat multiple myeloma panel every 6 months. Creatinine increased today. May be related to use of NSAIDs for back pain. Will prescribe tramadol Q6h As needed.   Orders Placed This Encounter  Procedures  . NM PET Image Restag (PS) Skull Base To Thigh    Standing Status:   Future    Standing Expiration Date:   07/22/2019    Order Specific Question:   ** REASON FOR EXAM (FREE TEXT)    Answer:   lung cancer s/p chemoRad. new back compression fracture. normal bone density suspect mets    Order Specific Question:   If indicated for the ordered procedure, I authorize the administration of a radiopharmaceutical per Radiology protocol    Answer:   Yes    Order Specific Question:   Preferred imaging location?    Answer:   [facility]    Order Specific Question:   Radiology Contrast Protocol - do NOT remove file path    Answer:   \\[email or web address]  . MR Thoracic Spine W Wo Contrast    Standing Status:   Future  Standing Expiration Date:   07/22/2019    Order Specific Question:   GRA to provide read?    Answer:   Yes    Order Specific Question:   If indicated for the ordered procedure, I authorize the administration of contrast media per Radiology protocol    Answer:   Yes    Order Specific Question:   What is the patient's sedation  requirement?    Answer:   No Sedation    Order Specific Question:   Use SRS Protocol?    Answer:   No    Order Specific Question:   Does the patient have a pacemaker or implanted devices?    Answer:   No    Order Specific Question:   Preferred imaging location?    Answer:   [facility] (table limit-400lbs)    Order Specific Question:   Radiology Contrast Protocol - do NOT remove file path    Answer:   \\[email or web address]   Follow up to be determined.  We spent sufficient time to discuss many aspect of care, questions were answered to patient's satisfaction. Total face to face encounter time for this patient visit was 25 min. >50% of the time was  spent in counseling and coordination of care.     [name], MD, PhD Hematology Oncology [facility] at [facility] Pager- [phone] 07/22/2018

## 2018-07-22 NOTE — Progress Notes (Signed)
Patient here for follow up. Pt started on Flexeril for pain/ muscle spasms but states that it is not working. Back pain worse when he bends over to get something.

## 2018-07-23 ENCOUNTER — Other Ambulatory Visit: Payer: Self-pay | Admitting: Oncology

## 2018-07-23 MED ORDER — LEVOTHYROXINE SODIUM 175 MCG PO TABS: 175.0000 ug | ORAL_TABLET | Freq: Every day | ORAL | 3 refills | Status: DC

## 2018-07-29 ENCOUNTER — Ambulatory Visit
Admission: RE | Admit: 2018-07-29 | Discharge: 2018-07-29 | Disposition: A | Discharge status: Home / Self Care | Payer: PPO | Source: Ambulatory Visit | Attending: Oncology | Admitting: Oncology

## 2018-07-29 ENCOUNTER — Other Ambulatory Visit: Payer: Self-pay

## 2018-07-29 DIAGNOSIS — Y842 Radiological procedure and radiotherapy as the cause of abnormal reaction of the patient, or of later complication, without mention of misadventure at the time of the procedure: Secondary | ICD-10-CM | POA: Diagnosis not present

## 2018-07-29 DIAGNOSIS — C3492 Malignant neoplasm of unspecified part of left bronchus or lung: Secondary | ICD-10-CM | POA: Insufficient documentation

## 2018-07-29 DIAGNOSIS — K208 Other esophagitis: Secondary | ICD-10-CM | POA: Insufficient documentation

## 2018-07-29 DIAGNOSIS — C3490 Malignant neoplasm of unspecified part of unspecified bronchus or lung: Secondary | ICD-10-CM | POA: Diagnosis not present

## 2018-07-29 LAB — GLUCOSE, CAPILLARY: Glucose-Capillary: 81 mg/dL (ref 70–99)

## 2018-07-29 MED ORDER — FLUDEOXYGLUCOSE F - 18 (FDG) INJECTION
12.0700 | Freq: Once | INTRAVENOUS | Status: AC | PRN
  Administered 2018-07-29: 12.07 via INTRAVENOUS

## 2018-08-01 ENCOUNTER — Ambulatory Visit
Admission: RE | Admit: 2018-08-01 | Discharge: 2018-08-01 | Disposition: A | Discharge status: Home / Self Care | Payer: PPO | Source: Ambulatory Visit | Attending: Oncology | Admitting: Oncology

## 2018-08-01 ENCOUNTER — Other Ambulatory Visit: Payer: Self-pay

## 2018-08-01 DIAGNOSIS — M546 Pain in thoracic spine: Secondary | ICD-10-CM | POA: Diagnosis not present

## 2018-08-01 DIAGNOSIS — C3492 Malignant neoplasm of unspecified part of left bronchus or lung: Secondary | ICD-10-CM | POA: Insufficient documentation

## 2018-08-01 LAB — POCT I-STAT CREATININE: Creatinine, Ser: 1.4 mg/dL — ABNORMAL HIGH (ref 0.61–1.24)

## 2018-08-01 MED ORDER — GADOBUTROL 1 MMOL/ML IV SOLN: 10.0000 mL | Freq: Once | INTRAVENOUS | Status: DC | PRN

## 2018-08-02 ENCOUNTER — Ambulatory Visit
Admission: RE | Admit: 2018-08-02 | Discharge: 2018-08-02 | Disposition: A | Discharge status: Home / Self Care | Payer: PPO | Source: Ambulatory Visit | Attending: Oncology | Admitting: Oncology

## 2018-08-02 DIAGNOSIS — M546 Pain in thoracic spine: Secondary | ICD-10-CM | POA: Diagnosis not present

## 2018-08-02 DIAGNOSIS — C3492 Malignant neoplasm of unspecified part of left bronchus or lung: Secondary | ICD-10-CM | POA: Diagnosis not present

## 2018-08-02 DIAGNOSIS — S22050A Wedge compression fracture of T5-T6 vertebra, initial encounter for closed fracture: Secondary | ICD-10-CM | POA: Diagnosis not present

## 2018-08-02 DIAGNOSIS — S22060A Wedge compression fracture of T7-T8 vertebra, initial encounter for closed fracture: Secondary | ICD-10-CM | POA: Diagnosis not present

## 2018-08-02 MED ORDER — GADOBUTROL 1 MMOL/ML IV SOLN
10.0000 mL | Freq: Once | INTRAVENOUS | Status: AC | PRN
  Administered 2018-08-02: 10 mL via INTRAVENOUS

## 2018-08-04 ENCOUNTER — Other Ambulatory Visit: Payer: Self-pay | Admitting: Oncology

## 2018-08-04 DIAGNOSIS — C3492 Malignant neoplasm of unspecified part of left bronchus or lung: Secondary | ICD-10-CM

## 2018-08-05 ENCOUNTER — Other Ambulatory Visit: Payer: Self-pay | Admitting: *Deleted

## 2018-08-05 DIAGNOSIS — S22000A Wedge compression fracture of unspecified thoracic vertebra, initial encounter for closed fracture: Secondary | ICD-10-CM

## 2018-08-06 ENCOUNTER — Other Ambulatory Visit: Payer: Self-pay

## 2018-08-06 ENCOUNTER — Inpatient Hospital Stay: Payer: PPO | Attending: Oncology

## 2018-08-06 DIAGNOSIS — Z9221 Personal history of antineoplastic chemotherapy: Secondary | ICD-10-CM | POA: Diagnosis not present

## 2018-08-06 DIAGNOSIS — Z452 Encounter for adjustment and management of vascular access device: Secondary | ICD-10-CM | POA: Diagnosis not present

## 2018-08-06 DIAGNOSIS — Z923 Personal history of irradiation: Secondary | ICD-10-CM | POA: Insufficient documentation

## 2018-08-06 DIAGNOSIS — Z95828 Presence of other vascular implants and grafts: Secondary | ICD-10-CM

## 2018-08-06 DIAGNOSIS — C3492 Malignant neoplasm of unspecified part of left bronchus or lung: Secondary | ICD-10-CM | POA: Diagnosis not present

## 2018-08-06 MED ORDER — SODIUM CHLORIDE 0.9% FLUSH
10.0000 mL | Freq: Once | INTRAVENOUS | Status: AC
  Administered 2018-08-06: 10 mL via INTRAVENOUS
  Filled 2018-08-06: qty 10

## 2018-08-06 MED ORDER — HEPARIN SOD (PORK) LOCK FLUSH 100 UNIT/ML IV SOLN
500.0000 [IU] | Freq: Once | INTRAVENOUS | Status: AC
  Administered 2018-08-06: 500 [IU] via INTRAVENOUS

## 2018-08-08 ENCOUNTER — Telehealth: Payer: Self-pay | Admitting: *Deleted

## 2018-08-08 NOTE — Telephone Encounter (Signed)
Notified pt that I will continue to keep following up on this referral

## 2018-08-08 NOTE — Telephone Encounter (Signed)
I called neurosurgery this morning and left a message requesting them to call me or pt

## 2018-08-08 NOTE — Telephone Encounter (Signed)
Clarene Critchley called asking if we have gotten an appointment with the neurosurgeon regarding patient back pain. States it has been a week since was told we would refer him.P lease return her call [phone]

## 2018-08-26 DIAGNOSIS — M546 Pain in thoracic spine: Secondary | ICD-10-CM | POA: Diagnosis not present

## 2018-08-26 DIAGNOSIS — S22060A Wedge compression fracture of T7-T8 vertebra, initial encounter for closed fracture: Secondary | ICD-10-CM | POA: Diagnosis not present

## 2018-08-26 DIAGNOSIS — S22050A Wedge compression fracture of T5-T6 vertebra, initial encounter for closed fracture: Secondary | ICD-10-CM | POA: Diagnosis not present

## 2018-08-29 ENCOUNTER — Other Ambulatory Visit: Payer: Self-pay | Admitting: *Deleted

## 2018-08-29 NOTE — Telephone Encounter (Signed)
Patient's caregiver-calling to request RF for patient for Protonix to be sent to patient's pharmacy.

## 2018-08-30 MED ORDER — PANTOPRAZOLE SODIUM 40 MG PO TBEC: 40.0000 mg | DELAYED_RELEASE_TABLET | Freq: Every day | ORAL | 1 refills | Status: DC

## 2018-09-02 DIAGNOSIS — B353 Tinea pedis: Secondary | ICD-10-CM | POA: Diagnosis not present

## 2018-09-02 DIAGNOSIS — D485 Neoplasm of uncertain behavior of skin: Secondary | ICD-10-CM | POA: Diagnosis not present

## 2018-09-02 DIAGNOSIS — G47419 Narcolepsy without cataplexy: Secondary | ICD-10-CM | POA: Diagnosis not present

## 2018-09-02 DIAGNOSIS — R1011 Right upper quadrant pain: Secondary | ICD-10-CM | POA: Diagnosis not present

## 2018-09-02 DIAGNOSIS — Y939 Activity, unspecified: Secondary | ICD-10-CM | POA: Diagnosis not present

## 2018-09-02 DIAGNOSIS — X32XXXA Exposure to sunlight, initial encounter: Secondary | ICD-10-CM | POA: Diagnosis not present

## 2018-09-02 DIAGNOSIS — L570 Actinic keratosis: Secondary | ICD-10-CM | POA: Diagnosis not present

## 2018-09-02 DIAGNOSIS — I10 Essential (primary) hypertension: Secondary | ICD-10-CM | POA: Diagnosis not present

## 2018-09-02 DIAGNOSIS — S22050A Wedge compression fracture of T5-T6 vertebra, initial encounter for closed fracture: Secondary | ICD-10-CM | POA: Diagnosis not present

## 2018-09-02 DIAGNOSIS — I340 Nonrheumatic mitral (valve) insufficiency: Secondary | ICD-10-CM | POA: Diagnosis not present

## 2018-09-02 DIAGNOSIS — M79674 Pain in right toe(s): Secondary | ICD-10-CM | POA: Diagnosis not present

## 2018-09-02 DIAGNOSIS — S22060A Wedge compression fracture of T7-T8 vertebra, initial encounter for closed fracture: Secondary | ICD-10-CM | POA: Diagnosis not present

## 2018-09-02 DIAGNOSIS — Y999 Unspecified external cause status: Secondary | ICD-10-CM | POA: Diagnosis not present

## 2018-09-02 DIAGNOSIS — Y929 Unspecified place or not applicable: Secondary | ICD-10-CM | POA: Diagnosis not present

## 2018-09-02 DIAGNOSIS — L821 Other seborrheic keratosis: Secondary | ICD-10-CM | POA: Diagnosis not present

## 2018-09-02 DIAGNOSIS — X58XXXA Exposure to other specified factors, initial encounter: Secondary | ICD-10-CM | POA: Diagnosis not present

## 2018-09-10 DIAGNOSIS — Z0000 Encounter for general adult medical examination without abnormal findings: Secondary | ICD-10-CM | POA: Diagnosis not present

## 2018-09-10 DIAGNOSIS — Z125 Encounter for screening for malignant neoplasm of prostate: Secondary | ICD-10-CM | POA: Diagnosis not present

## 2018-09-16 ENCOUNTER — Other Ambulatory Visit: Payer: Self-pay

## 2018-09-17 ENCOUNTER — Other Ambulatory Visit: Payer: Self-pay

## 2018-09-17 ENCOUNTER — Inpatient Hospital Stay: Payer: PPO | Attending: Oncology

## 2018-09-17 DIAGNOSIS — Z9221 Personal history of antineoplastic chemotherapy: Secondary | ICD-10-CM | POA: Insufficient documentation

## 2018-09-17 DIAGNOSIS — Z452 Encounter for adjustment and management of vascular access device: Secondary | ICD-10-CM | POA: Insufficient documentation

## 2018-09-17 DIAGNOSIS — G5711 Meralgia paresthetica, right lower limb: Secondary | ICD-10-CM | POA: Diagnosis not present

## 2018-09-17 DIAGNOSIS — Z0000 Encounter for general adult medical examination without abnormal findings: Secondary | ICD-10-CM | POA: Diagnosis not present

## 2018-09-17 DIAGNOSIS — C3492 Malignant neoplasm of unspecified part of left bronchus or lung: Secondary | ICD-10-CM | POA: Diagnosis not present

## 2018-09-17 DIAGNOSIS — J449 Chronic obstructive pulmonary disease, unspecified: Secondary | ICD-10-CM | POA: Diagnosis not present

## 2018-09-17 DIAGNOSIS — Z923 Personal history of irradiation: Secondary | ICD-10-CM | POA: Insufficient documentation

## 2018-09-17 DIAGNOSIS — Z95828 Presence of other vascular implants and grafts: Secondary | ICD-10-CM

## 2018-09-17 DIAGNOSIS — N289 Disorder of kidney and ureter, unspecified: Secondary | ICD-10-CM | POA: Diagnosis not present

## 2018-09-17 DIAGNOSIS — R110 Nausea: Secondary | ICD-10-CM | POA: Diagnosis not present

## 2018-09-17 MED ORDER — HEPARIN SOD (PORK) LOCK FLUSH 100 UNIT/ML IV SOLN
500.0000 [IU] | Freq: Once | INTRAVENOUS | Status: AC
  Administered 2018-09-17: 500 [IU] via INTRAVENOUS

## 2018-09-17 MED ORDER — SODIUM CHLORIDE 0.9% FLUSH
10.0000 mL | Freq: Once | INTRAVENOUS | Status: AC
  Administered 2018-09-17: 13:00:00 10 mL via INTRAVENOUS
  Filled 2018-09-17: qty 10

## 2018-09-20 DIAGNOSIS — R52 Pain, unspecified: Secondary | ICD-10-CM | POA: Diagnosis not present

## 2018-09-20 DIAGNOSIS — R509 Fever, unspecified: Secondary | ICD-10-CM | POA: Diagnosis not present

## 2018-09-20 DIAGNOSIS — R112 Nausea with vomiting, unspecified: Secondary | ICD-10-CM | POA: Diagnosis not present

## 2018-09-20 DIAGNOSIS — R05 Cough: Secondary | ICD-10-CM | POA: Diagnosis not present

## 2018-09-25 ENCOUNTER — Other Ambulatory Visit: Payer: Self-pay | Admitting: *Deleted

## 2018-09-25 MED ORDER — FLUTICASONE PROPIONATE 50 MCG/ACT NA SUSP: 1.0000 | Freq: Every day | NASAL | 2 refills | Status: DC

## 2018-09-26 ENCOUNTER — Other Ambulatory Visit: Payer: Self-pay | Admitting: *Deleted

## 2018-09-26 MED ORDER — TAMSULOSIN HCL 0.4 MG PO CAPS: 0.4000 mg | ORAL_CAPSULE | Freq: Every day | ORAL | 0 refills | Status: DC

## 2018-10-02 DIAGNOSIS — S22050A Wedge compression fracture of T5-T6 vertebra, initial encounter for closed fracture: Secondary | ICD-10-CM | POA: Diagnosis not present

## 2018-10-23 ENCOUNTER — Other Ambulatory Visit: Payer: Self-pay | Admitting: *Deleted

## 2018-10-23 MED ORDER — BUDESONIDE-FORMOTEROL FUMARATE 160-4.5 MCG/ACT IN AERO: 2.0000 | INHALATION_SPRAY | Freq: Two times a day (BID) | RESPIRATORY_TRACT | 1 refills | Status: DC

## 2018-11-04 ENCOUNTER — Ambulatory Visit
Admission: RE | Admit: 2018-11-04 | Discharge: 2018-11-04 | Disposition: A | Discharge status: Home / Self Care | Payer: PPO | Source: Ambulatory Visit | Attending: Oncology | Admitting: Oncology

## 2018-11-04 ENCOUNTER — Other Ambulatory Visit: Payer: Self-pay

## 2018-11-04 DIAGNOSIS — C3492 Malignant neoplasm of unspecified part of left bronchus or lung: Secondary | ICD-10-CM | POA: Diagnosis not present

## 2018-11-04 DIAGNOSIS — C3490 Malignant neoplasm of unspecified part of unspecified bronchus or lung: Secondary | ICD-10-CM | POA: Diagnosis not present

## 2018-11-05 ENCOUNTER — Inpatient Hospital Stay (HOSPITAL_BASED_OUTPATIENT_CLINIC_OR_DEPARTMENT_OTHER): Payer: PPO | Admitting: Oncology

## 2018-11-05 ENCOUNTER — Other Ambulatory Visit: Payer: Self-pay

## 2018-11-05 ENCOUNTER — Inpatient Hospital Stay: Payer: PPO

## 2018-11-05 ENCOUNTER — Inpatient Hospital Stay: Payer: PPO | Attending: Oncology

## 2018-11-05 ENCOUNTER — Encounter: Payer: Self-pay | Admitting: Oncology

## 2018-11-05 VITALS — BP 113/88 | HR 83 | Temp 96.0°F | Resp 18 | Wt 239.0 lb

## 2018-11-05 DIAGNOSIS — E039 Hypothyroidism, unspecified: Secondary | ICD-10-CM

## 2018-11-05 DIAGNOSIS — N5089 Other specified disorders of the male genital organs: Secondary | ICD-10-CM

## 2018-11-05 DIAGNOSIS — Z7952 Long term (current) use of systemic steroids: Secondary | ICD-10-CM | POA: Insufficient documentation

## 2018-11-05 DIAGNOSIS — Z7982 Long term (current) use of aspirin: Secondary | ICD-10-CM | POA: Diagnosis not present

## 2018-11-05 DIAGNOSIS — E032 Hypothyroidism due to medicaments and other exogenous substances: Secondary | ICD-10-CM

## 2018-11-05 DIAGNOSIS — N189 Chronic kidney disease, unspecified: Secondary | ICD-10-CM

## 2018-11-05 DIAGNOSIS — E2740 Unspecified adrenocortical insufficiency: Secondary | ICD-10-CM

## 2018-11-05 DIAGNOSIS — R5382 Chronic fatigue, unspecified: Secondary | ICD-10-CM

## 2018-11-05 DIAGNOSIS — R5383 Other fatigue: Secondary | ICD-10-CM | POA: Insufficient documentation

## 2018-11-05 DIAGNOSIS — R635 Abnormal weight gain: Secondary | ICD-10-CM

## 2018-11-05 DIAGNOSIS — Z803 Family history of malignant neoplasm of breast: Secondary | ICD-10-CM

## 2018-11-05 DIAGNOSIS — N400 Enlarged prostate without lower urinary tract symptoms: Secondary | ICD-10-CM | POA: Diagnosis not present

## 2018-11-05 DIAGNOSIS — I129 Hypertensive chronic kidney disease with stage 1 through stage 4 chronic kidney disease, or unspecified chronic kidney disease: Secondary | ICD-10-CM | POA: Insufficient documentation

## 2018-11-05 DIAGNOSIS — Z87891 Personal history of nicotine dependence: Secondary | ICD-10-CM | POA: Diagnosis not present

## 2018-11-05 DIAGNOSIS — Z95828 Presence of other vascular implants and grafts: Secondary | ICD-10-CM

## 2018-11-05 DIAGNOSIS — Z7951 Long term (current) use of inhaled steroids: Secondary | ICD-10-CM

## 2018-11-05 DIAGNOSIS — Z923 Personal history of irradiation: Secondary | ICD-10-CM | POA: Diagnosis not present

## 2018-11-05 DIAGNOSIS — C3492 Malignant neoplasm of unspecified part of left bronchus or lung: Secondary | ICD-10-CM | POA: Insufficient documentation

## 2018-11-05 DIAGNOSIS — E669 Obesity, unspecified: Secondary | ICD-10-CM | POA: Insufficient documentation

## 2018-11-05 DIAGNOSIS — Z79899 Other long term (current) drug therapy: Secondary | ICD-10-CM | POA: Insufficient documentation

## 2018-11-05 DIAGNOSIS — S22000A Wedge compression fracture of unspecified thoracic vertebra, initial encounter for closed fracture: Secondary | ICD-10-CM

## 2018-11-05 DIAGNOSIS — N183 Chronic kidney disease, stage 3 (moderate): Secondary | ICD-10-CM | POA: Diagnosis not present

## 2018-11-05 DIAGNOSIS — T50905A Adverse effect of unspecified drugs, medicaments and biological substances, initial encounter: Secondary | ICD-10-CM

## 2018-11-05 DIAGNOSIS — M1990 Unspecified osteoarthritis, unspecified site: Secondary | ICD-10-CM | POA: Diagnosis not present

## 2018-11-05 DIAGNOSIS — Z9221 Personal history of antineoplastic chemotherapy: Secondary | ICD-10-CM

## 2018-11-05 LAB — CBC WITH DIFFERENTIAL/PLATELET
Abs Immature Granulocytes: 0.06 10*3/uL (ref 0.00–0.07)
Basophils Absolute: 0.1 10*3/uL (ref 0.0–0.1)
Basophils Relative: 1 %
Eosinophils Absolute: 0.3 10*3/uL (ref 0.0–0.5)
Eosinophils Relative: 5 %
HCT: 42.4 % (ref 39.0–52.0)
Hemoglobin: 13.5 g/dL (ref 13.0–17.0)
Immature Granulocytes: 1 %
Lymphocytes Relative: 21 %
Lymphs Abs: 1.3 10*3/uL (ref 0.7–4.0)
MCH: 29.9 pg (ref 26.0–34.0)
MCHC: 31.8 g/dL (ref 30.0–36.0)
MCV: 94 fL (ref 80.0–100.0)
Monocytes Absolute: 0.9 10*3/uL (ref 0.1–1.0)
Monocytes Relative: 14 %
Neutro Abs: 3.5 10*3/uL (ref 1.7–7.7)
Neutrophils Relative %: 58 %
Platelets: 190 10*3/uL (ref 150–400)
RBC: 4.51 MIL/uL (ref 4.22–5.81)
RDW: 15.2 % (ref 11.5–15.5)
WBC: 6 10*3/uL (ref 4.0–10.5)
nRBC: 0 % (ref 0.0–0.2)

## 2018-11-05 LAB — TSH: TSH: 0.819 u[IU]/mL (ref 0.350–4.500)

## 2018-11-05 LAB — COMPREHENSIVE METABOLIC PANEL
ALT: 24 U/L (ref 0–44)
AST: 20 U/L (ref 15–41)
Albumin: 3.6 g/dL (ref 3.5–5.0)
Alkaline Phosphatase: 63 U/L (ref 38–126)
Anion gap: 9 (ref 5–15)
BUN: 30 mg/dL — ABNORMAL HIGH (ref 8–23)
CO2: 25 mmol/L (ref 22–32)
Calcium: 8.6 mg/dL — ABNORMAL LOW (ref 8.9–10.3)
Chloride: 108 mmol/L (ref 98–111)
Creatinine, Ser: 1.73 mg/dL — ABNORMAL HIGH (ref 0.61–1.24)
GFR calc Af Amer: 45 mL/min — ABNORMAL LOW (ref >60)
GFR calc non Af Amer: 39 mL/min — ABNORMAL LOW (ref >60)
Glucose, Bld: 109 mg/dL — ABNORMAL HIGH (ref 70–99)
Potassium: 3.9 mmol/L (ref 3.5–5.1)
Sodium: 142 mmol/L (ref 135–145)
Total Bilirubin: 0.5 mg/dL (ref 0.3–1.2)
Total Protein: 6.6 g/dL (ref 6.5–8.1)

## 2018-11-05 MED ORDER — HEPARIN SOD (PORK) LOCK FLUSH 100 UNIT/ML IV SOLN
500.0000 [IU] | Freq: Once | INTRAVENOUS | Status: AC
  Administered 2018-11-05: 500 [IU] via INTRAVENOUS

## 2018-11-05 MED ORDER — SODIUM CHLORIDE 0.9% FLUSH
10.0000 mL | Freq: Once | INTRAVENOUS | Status: AC
  Administered 2018-11-05: 10 mL via INTRAVENOUS
  Filled 2018-11-05: qty 10

## 2018-11-05 NOTE — Progress Notes (Signed)
Hematology/Oncology Follow Up Note [facility] Telephone:([phone] [phone]   Patient Care Team: [name], MD as PCP - General (Family Medicine) [name], [name], MD (General Surgery) [name], MD as Consulting Physician (Oncology) [name], RN as Registered Nurse  Reason for Visit:  Follow up for lung cancer, immunotherapy side effects, adrenal insufficiency.  HISTORY OF PRESENTING ILLNESS:  Marc Gray 73 y.o.  male who presents for treatment of Stage IIIB (cT2b, cN3, cM0)  Adenocarcinoma of lung.  # Patient is s/p [place] and taxol concurrent RT for treatment of Stage IIIB Lung cancer. He recieved additional lung boost RT Currently on Durvalumab maintenance.   # Colon CA was listed in his history, however reviewing his surgical pathology from colonoscopy on 04/15/2014, during which he had polyps removed and all are negative for cancer.patient is scheduled for colonoscopy screening this year.  # 5/1/ 2019 CT scan was discussed at tumor board and changes consistent with radiation changes  INTERVAL HISTORY Patient he presents for follow-up of fo stage IIIB adenocarcinoma of the lung.  Chronic fatigue at baseline. Reports enlarged testicle.  Denies any pain.  Patient follows up with endocrinologist for acquired adrenal insufficiency and hypothyroidism.  He is on chronic steroids.  He has gained 6 pounds during the interval.  Review of Systems  Constitutional: Positive for fatigue. Negative for appetite change, chills, fever and unexpected weight change.  HENT:   Negative for hearing loss and voice change.   Eyes: Negative for eye problems and icterus.  Respiratory: Negative for chest tightness, cough and shortness of breath.   Cardiovascular: Negative for chest pain and leg swelling.  Gastrointestinal: Negative for abdominal distention, abdominal pain and blood in stool.  Endocrine: Negative for hot flashes.    Genitourinary: Negative for difficulty urinating, dysuria and frequency.        Enlarged testicle  Musculoskeletal: Negative for arthralgias and back pain.  Skin: Negative for itching and rash.  Neurological: Negative for extremity weakness, light-headedness and numbness.  Hematological: Negative for adenopathy. Does not bruise/bleed easily.  Psychiatric/Behavioral: Negative for confusion.    MEDICAL HISTORY:  Past Medical History:  Diagnosis Date   Anxiety    Arthritis    SHOULDER   Cataract    Colon cancer ([place]) 2015   Pt states during his colonoscopy he had cancerous polyps removed.    Depression    SINCE DIAGNOSIS   Dyspnea    DOE   Enlarged prostate    Hypertension    Lung cancer [facility])    Aug 2018   Pain    DUE TO LUNG ISSUE    SURGICAL HISTORY: Past Surgical History:  Procedure Laterality Date   CATARACT EXTRACTION W/ INTRAOCULAR LENS  IMPLANT, BILATERAL     COLONOSCOPY     EYE SURGERY     LYMPH GLAND EXCISION N/A 02/09/2017   Procedure: CERVICAL LYMPH NODE BIOPSY;  Surgeon: Robert Bellow, MD;  Location: [facility] ORS;  Service: General;  Laterality: N/A;   PORTACATH PLACEMENT Right 02/23/2017   Procedure: INSERTION PORT-A-CATH;  Surgeon: Robert Bellow, MD;  Location: [facility] ORS;  Service: General;  Laterality: Right;   PROSTATE SURGERY     TONSILLECTOMY      SOCIAL HISTORY: Social History   Socioeconomic History   Marital status: Married    Spouse name: Not on file   Number of children: Not on file   Years of education: Not on file   Highest education level: Not  on file  Occupational History   Not on file  Social Needs   Financial resource strain: Not on file   Food insecurity:    Worry: Not on file    Inability: Not on file   Transportation needs:    Medical: Not on file    Non-medical: Not on file  Tobacco Use   Smoking status: Former Smoker    Packs/day: 1.00    Years: 53.00    Pack years: 53.00    Types:  Cigarettes    Last attempt to quit: 04/14/2017    Years since quitting: 1.5   Smokeless tobacco: Never Used   Tobacco comment: Quit November 2018  Substance and Sexual Activity   Alcohol use: Yes    Comment: occassional   Drug use: No   Sexual activity: Yes  Lifestyle   Physical activity:    Days per week: Not on file    Minutes per session: Not on file   Stress: Not on file  Relationships   Social connections:    Talks on phone: Not on file    Gets together: Not on file    Attends religious service: Not on file    Active member of club or organization: Not on file    Attends meetings of clubs or organizations: Not on file    Relationship status: Not on file   Intimate partner violence:    Fear of current or ex partner: Not on file    Emotionally abused: Not on file    Physically abused: Not on file    Forced sexual activity: Not on file  Other Topics Concern   Not on file  Social History Narrative   Not on file    FAMILY HISTORY: Family History  Problem Relation Age of Onset   Breast cancer Maternal Grandmother    Dementia Mother    Diabetes Father    Stroke Father     ALLERGIES:  has No Known Allergies.  MEDICATIONS:  Current Outpatient Medications  Medication Sig Dispense Refill   aspirin 325 MG tablet Take 650 mg by mouth daily.      budesonide-formoterol (SYMBICORT) 160-4.5 MCG/ACT inhaler Inhale 2 puffs into the lungs 2 (two) times daily. 1 Inhaler 1   calcium-vitamin D (OSCAL WITH D) 500-200 MG-UNIT tablet Take 1 tablet by mouth daily. 90 tablet 1   Chlorpheniramine-Phenylephrine (SINUS & ALLERGY) 4-10 MG tablet Take 1 tablet by mouth daily as needed.      cyclobenzaprine (FLEXERIL) 5 MG tablet Take by mouth.     diphenhydrAMINE-zinc acetate (BENADRYL EXTRA STRENGTH) cream Apply 1 application topically 3 (three) times daily as needed for itching. 28 g 0   fluticasone (FLONASE) 50 MCG/ACT nasal spray Place 1 spray into both nostrils  daily. 16 g 2   HYDROcodone-acetaminophen (NORCO/VICODIN) 5-325 MG tablet TK 1 TO 2 TS PO Q 4 TO 6 H PRF PAIN     levothyroxine (SYNTHROID) 175 MCG tablet Take 1 tablet (175 mcg total) by mouth daily before breakfast. 30 tablet 3   lidocaine-prilocaine (EMLA) cream Apply over port 1-2 hours prior to chemotherapy treatment.  Cover with plastic wrap. 30 g 1   Melatonin 10 MG TABS Take 10 mg by mouth at bedtime as needed.     Multiple Vitamin (MULTI-VITAMINS) TABS Take 1 tablet by mouth daily.      ondansetron (ZOFRAN) 8 MG tablet Take 1 tablet (8 mg total) by mouth every 12 (twelve) hours as needed for nausea or vomiting.  60 tablet 1   pantoprazole (PROTONIX) 40 MG tablet Take 1 tablet (40 mg total) by mouth daily. 90 tablet 1   predniSONE (DELTASONE) 10 MG tablet Take one tablet each morning. Take one-half tablet each afternoon (take between 2 - 5 PM) 45 tablet 0   PROAIR HFA 108 (90 Base) MCG/ACT inhaler INHALE 2 PUFFS BY MOUTH EVERY 6 HOURS AS NEEDED 8.5 g 0   prochlorperazine (COMPAZINE) 10 MG tablet Take 1 tablet (10 mg total) by mouth every 6 (six) hours as needed for nausea or vomiting. 60 tablet 2   tamsulosin (FLOMAX) 0.4 MG CAPS capsule Take 1 capsule (0.4 mg total) by mouth daily. 90 capsule 0   tiotropium (SPIRIVA HANDIHALER) 18 MCG inhalation capsule INHALE 1 CAPSULE DAILY 30 capsule 0   traMADol (ULTRAM) 50 MG tablet Take 1 tablet (50 mg total) by mouth every 6 (six) hours as needed for moderate pain or severe pain. 30 tablet 0   triamcinolone ointment (KENALOG) 0.5 % Apply 1 application topically 3 (three) times daily as needed. 30 g 1   No current facility-administered medications for this visit.    Facility-Administered Medications Ordered in Other Visits  Medication Dose Route Frequency Provider Last Rate Last Dose   sodium chloride flush (NS) 0.9 % injection 10 mL  10 mL Intravenous PRN [name], MD   10 mL at 04/03/17 9381      .  PHYSICAL EXAMINATION: ECOG  PERFORMANCE STATUS: 1 - Symptomatic but completely ambulatory Vitals:   11/05/18 1351  BP: 113/88  Pulse: 83  Resp: 18  Temp: (!) 96 F (35.6 C)  SpO2: 96%   Filed Weights   11/05/18 1351  Weight: 239 lb (108.4 kg)  Physical Exam  Constitutional: He is oriented to person, place, and time. No distress.  Obese  HENT:  Head: Normocephalic and atraumatic.  Right Ear: External ear normal.  Left Ear: External ear normal.  Nose: Nose normal.  Mouth/Throat: Oropharynx is clear and moist. No oropharyngeal exudate.  Eyes: Pupils are equal, round, and reactive to light. EOM are normal. No scleral icterus.  Neck: Normal range of motion. Neck supple.  Cardiovascular: Normal rate, regular rhythm and normal heart sounds.  No murmur heard. Pulmonary/Chest: Effort normal. No respiratory distress. He has no wheezes. He has no rales. He exhibits no tenderness.  Decreased sound bilaterally lower lobe.   Abdominal: Soft. Bowel sounds are normal. He exhibits no distension. There is no abdominal tenderness.  Musculoskeletal: Normal range of motion.        General: No tenderness or edema.  Lymphadenopathy:    He has no cervical adenopathy.  Neurological: He is alert and oriented to person, place, and time. No cranial nerve deficit. He exhibits normal muscle tone. Coordination normal.  Skin: Skin is warm and dry. No rash noted. He is not diaphoretic. No erythema.  Psychiatric: Memory, affect and judgment normal.   LABORATORY DATA:  I have reviewed the data as listed Lab Results  Component Value Date   WBC 6.0 11/05/2018   HGB 13.5 11/05/2018   HCT 42.4 11/05/2018   MCV 94.0 11/05/2018   PLT 190 11/05/2018   Recent Labs    06/25/18 0843 07/22/18 1252 08/01/18 0832 11/05/18 1303  NA 140 138  --  142  K 3.8 4.1  --  3.9  CL 106 102  --  108  CO2 26 27  --  25  GLUCOSE 109* 96  --  109*  BUN 28* 33*  --  30*  CREATININE 1.38* 1.50* 1.40* 1.73*  CALCIUM 8.6* 8.9  --  8.6*  GFRNONAA 51*  46*  --  39*  GFRAA 59* 54*  --  45*  PROT 6.5 6.9  --  6.6  ALBUMIN 3.5 3.8  --  3.6  AST 22 20  --  20  ALT 28 26  --  24  ALKPHOS 51 67  --  63  BILITOT 0.6 0.6  --  0.5  RADIOGRAPHIC STUDIES: I have personally reviewed the radiological images as listed and agreed with the findings in the report. CT chest without contrast 11/04/2018  1 Post treatment scarring in the medial left hemithorax, stable. No evidence recurrent or metastatic disease. 2. Ascending Aortic aneurysm NOS (ICD10-I71.9). Recommend annual imaging followup by CTA or MRA. This recommendation follows 2010 ACCF/AHA/AATS/ACR/ASA/SCA/SCAI/SIR/STS/SVM Guidelines for the Diagnosis and Management of Patients with Thoracic Aortic Disease. Circulation. 2010; 121: W119-J478. Aortic aneurysm NOS (ICD10-I71.9). 3. Aortic atherosclerosis (ICD10-170.0). Coronary artery calcification. 4. Centrilobular emphysema.  ASSESSMENT & PLAN:  Cancer Staging Adenocarcinoma of left lung, stage 3 (HCC) Staging form: Lung, AJCC 8th Edition - Clinical stage from 02/14/2017: Stage IIIB (cT2b, cN3, cM0) - Signed by [name], MD on 02/14/2017  1. Adenocarcinoma of left lung, stage 3 ([place])   2. Testicle swelling   3. Chronic kidney disease, unspecified CKD stage   4. Hypothyroidism, unspecified type   5. Compression fracture of body of thoracic vertebra (HCC)   6. Weight gain due to medication   7. Hypothyroidism due to medication   8. Adrenal insufficiency (HCC)    #Stage IIIB lung adenocarcinoma, s/p concurrent chemo/RT and immunotherapy.  Interim CT scan was independently reviewed and discussed with patient. Stable disease.  No evidence of metastatic disease or recurrence.  #Chronic steroid use for adrenal insufficiency.  Weight gain. Recommend patient to continue follow-up with endocrinologist  #Hypothyroidism on Synthroid.  TSH was 0.819.  Continue follow-up with endocrinologist.  #T5 and T7 compression fractures.  Recommend patient to  continue calcium and vitamin D supplementation.  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  Repeat multiple myeloma panel every 6 months- September 2020.. Creatinine increased.  Recommend patient to avoid NSAIDs. Recommend patient to discuss with primary care physician and establish care with nephrologist. Patient requests me to refer patient to nephrologist.  #Enlarged testicles, will refer patient to urology for further evaluation.  Orders Placed This Encounter  Procedures   CT Chest Wo Contrast    Standing Status:   Future    Standing Expiration Date:   11/05/2019    Order Specific Question:   ** REASON FOR EXAM (FREE TEXT)    Answer:   Lung Cancer    Order Specific Question:   Preferred imaging location?    Answer:   [facility]    Order Specific Question:   Radiology Contrast Protocol - do NOT remove file path    Answer:   [email or web address]   Ambulatory referral to Nephrology    Referral Priority:   Routine    Referral Type:   Consultation    Referral Reason:   Specialty Services Required    Referred to Provider:   Murlean Iba, MD    Requested Specialty:   Nephrology    Number of Visits Requested:   1   Ambulatory referral to Urology    Referral Priority:   Routine    Referral Type:   Consultation    Referral Reason:   Specialty Services  Required    Referred to Provider:   [name], MD    Requested Specialty:   Urology    Number of Visits Requested:   1   Follow up in 3 months, repeat CT chest surveillance scan. We spent sufficient time to discuss many aspect of care, questions were answered to patient's satisfaction. Total face to face encounter time for this patient visit was 25 min. >50% of the time was  spent in counseling and coordination of care.   [name], MD, PhD  11/05/2018

## 2018-11-05 NOTE — Progress Notes (Signed)
Patient here for follow up. No concerns voiced.  °

## 2018-11-07 ENCOUNTER — Other Ambulatory Visit: Payer: Self-pay | Admitting: *Deleted

## 2018-11-12 ENCOUNTER — Encounter: Payer: Self-pay | Admitting: Urology

## 2018-11-12 ENCOUNTER — Other Ambulatory Visit: Payer: Self-pay

## 2018-11-12 ENCOUNTER — Ambulatory Visit: Payer: PPO | Admitting: Urology

## 2018-11-12 VITALS — BP 155/85 | HR 91 | Ht 73.0 in | Wt 239.0 lb

## 2018-11-12 DIAGNOSIS — N5089 Other specified disorders of the male genital organs: Secondary | ICD-10-CM | POA: Diagnosis not present

## 2018-11-12 DIAGNOSIS — N138 Other obstructive and reflux uropathy: Secondary | ICD-10-CM

## 2018-11-12 DIAGNOSIS — N401 Enlarged prostate with lower urinary tract symptoms: Secondary | ICD-10-CM

## 2018-11-12 LAB — BLADDER SCAN AMB NON-IMAGING

## 2018-11-12 MED ORDER — TADALAFIL 5 MG PO TABS: 5.0000 mg | ORAL_TABLET | Freq: Every day | ORAL | 11 refills | Status: DC | PRN

## 2018-11-12 MED ORDER — CIPROFLOXACIN HCL 500 MG PO TABS
500.0000 mg | ORAL_TABLET | Freq: Once | ORAL | Status: AC
  Administered 2018-11-12: 500 mg via ORAL

## 2018-11-12 NOTE — Progress Notes (Signed)
11/12/2018 9:35 AM   Marc Gray 04-24-1946 [id number]  Referring provider: [name], MD [address] [place],  [place] [address]  CC: Scrotal swelling  HPI: I saw [name] in urology clinic today for evaluation of left scrotal swelling.  He is a 73 year old man with stage Gray lung cancer being followed closely by oncology.  He is on immunotherapy at this time.  He reports 3 to 4 months of left scrotal swelling.  This is not painful, and is minimally bothersome to him.  Severity is mild.  There are no aggravating or alleviating factors.  He denies any gross hematuria.  He has had some mild difficulty voiding, and recently re-started Flomax.  He denies any weight loss, fevers, night sweats, or chills.  His history is notable for urinary retention with bilateral hydronephrosis in 2013 treated with a TURP by Dr. Jacqlyn Larsen.  His renal function is worsened over the last few months, with a rising creatinine to 1.7, EGFR 39.  There was no hydronephrosis on PET scan in March 2020, however the left kidney was atrophic.  PVR in clinic today was 750cc.  The patient was asked to void again, and PVR remained definitely elevated at 717cc.  He has a 50-pack-year smoking history, has quit smoking.  He denies any gross hematuria or flank pain.  Denies weight loss, fevers, chills, or UTIs.  He also reports worsening erectile dysfunction.  He is never tried any medications for this previously.  PMH: Past Medical History:  Diagnosis Date  . Anxiety   . Arthritis    SHOULDER  . Cataract   . Colon cancer ([place]) 2015   Pt states during his colonoscopy he had cancerous polyps removed.   . Depression    SINCE DIAGNOSIS  . Dyspnea    DOE  . Enlarged prostate   . Hypertension   . Lung cancer [facility])    Aug 2018  . Pain    DUE TO LUNG ISSUE    Surgical History: Past Surgical History:  Procedure Laterality Date  . CATARACT EXTRACTION W/ INTRAOCULAR LENS   IMPLANT, BILATERAL    . COLONOSCOPY    . EYE SURGERY    . LYMPH GLAND EXCISION N/A 02/09/2017   Procedure: CERVICAL LYMPH NODE BIOPSY;  Surgeon: Robert Bellow, MD;  Location: [place] [facility];  Service: General;  Laterality: N/A;  . PORTACATH PLACEMENT Right 02/23/2017   Procedure: INSERTION PORT-A-CATH;  Surgeon: Robert Bellow, MD;  Location: [facility] ORS;  Service: General;  Laterality: Right;  . PROSTATE SURGERY    . TONSILLECTOMY      Allergies: No Known Allergies  Family History: Family History  Problem Relation Age of Onset  . Breast cancer Maternal Grandmother   . Dementia Mother   . Diabetes Father   . Stroke Father     Social History:  reports that he quit smoking about 18 months ago. His smoking use included cigarettes. He has a 53.00 pack-year smoking history. He has never used smokeless tobacco. He reports current alcohol use. He reports that he does not use drugs.  ROS: Please see flowsheet from today's date for complete review of systems.  Physical Exam: BP (!) 155/85 (BP Location: Left Arm, Patient Position: Sitting)   Pulse 91   Ht 6' 1"  (1.854 m)   Wt 239 lb (108.4 kg)   BMI 31.53 kg/m    Constitutional:  Alert and oriented, No acute distress. Cardiovascular: No clubbing, cyanosis, or edema.  Respiratory: Normal respiratory effort, no increased work of breathing. GI: Abdomen is soft, nontender, nondistended, no abdominal masses GU: No CVA tenderness, phallus without lesions, widely patent meatus.  Left hydrocele and potential epididymal cyst, no definite testicular masses. Lymph: No cervical or inguinal lymphadenopathy. Skin: No rashes, bruises or suspicious lesions. Neurologic: Grossly intact, no focal deficits, moving all 4 extremities. Psychiatric: Normal mood and affect.  Laboratory Data: Creatinine 1.73, EGFR 39 Urinalysis today greater than 30 WBCs, 5-10 RBCs, few bacteria, nitrite negative  Cystoscopy Procedure Note:  Indication: Elevated PVR,  history of TURP  After informed consent and discussion of the procedure and its risks, Bryndan Bilyk Gray was positioned and prepped in the standard fashion. Cystoscopy was performed with a flexible cystoscope.  There was a bladder neck defect consistent with prior TURP.  Minimal anterior prostatic regrowth.  Massively distended bladder with cloudy urine, 800 cc aspirated.  Thorough cystoscopy performed.  Large floppy bladder but no obvious bladder tumors noted.   Findings: Massively distended bladder, no bladder tumors Minimal anterior prostate regrowth, but channel primarily open   Assessment & Plan:   In summary, the patient is a 73 year old male with history of TURP for urinary retention with bilateral hydronephrosis in 2013 who presents for evaluation of scrotal swelling.  His scrotal exam is relatively benign, but will obtain a scrotal ultrasound to rule out any testicular pathology.  His primary issue today is incomplete bladder emptying, with PVR greater than 700 cc.  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  He is resistant to CIC, but is willing to try this if his bladder does not function.   -Trial of tadalafil 5 mg on demand for ED -Scrotal ultrasound to rule out testicular pathology -Urodynamics to evaluate atonic bladder versus bladder outlet obstruction -Renal ultrasound to evaluate for hydronephrosis as etiology of worsening renal function -If functional bladder, consider repeat TURP; however if bladder atonic will need to start CIC 3 times daily -Follow-up to discuss urodynamic/ultrasound results and bladder management options  [name], MD  [facility] [address], [place], [place] [address] [phone]

## 2018-11-12 NOTE — Patient Instructions (Signed)
Urodynamic Testing What is urodynamic testing?  Urodynamic testing is a set of tests and X-rays. These tests help to find out how well your bladder and the part of your body that drains pee from the bladder (urethra) are working. Why do I need this testing? You may need these tests if you:  Are leaking pee (urine).  Have trouble starting or stopping peeing (urination).  Pee often or it hurts to pee.  Have urinary tract infections often.  Are not able to empty your bladder.  Have strong urges to pee.  Have a weak flow of pee. How do I prepare for the tests?  Ask your doctor about: ? Changing or stopping your normal medicines. This is important if you take diabetes medicines or blood thinners. ? Whether you should arrive for the test having to pee.  Tell your doctor about: ? Any allergies you have. ? All medicines you are taking, including vitamins, herbs, eye drops, creams, and over-the-counter medicines. ? Whether you are pregnant or may be pregnant. What are the risks? In general, these tests are safe. But some of the tests have risks. These may include:  Discomfort.  Feeling a need to pee often.  Bleeding.  Infection.  Allergic reactions to medicines or dyes. How are the tests done? The tests may be done in one visit or may be done over a few visits. You may be given an antibiotic medicine to help keep you from getting an infection. The types of tests done may include: Uroflowmetry This test measures how much you pee and how long it takes. You will pee into a type of toilet or device that sends measurements to a computer. Postvoid residual measurement This test measures how much pee is left in your bladder after you pee. It may be done by:  Using sound waves and a computer to create pictures of your bladder (ultrasound).  Putting a thin, flexible tube (catheter) into your bladder to take out the pee that is left so it can be measured. Cystometric testing This  test measures how much pressure there is in your bladder before you pee and as you pee.  You may be given a medicine to numb the area (local anesthetic).  The area around the opening of your urethra will be cleaned.  A thin, flexible tube will be used to empty your bladder.  A flexible tube that can measure pressure will then be placed. Your bladder will be filled with germ-free water.  Pressure will be measured: ? As your bladder fills. ? When you feel the need to pee. ? As your bladder is emptied.  In some cases, your bladder may be filled with a dye that shows up on X-rays (contrast material) so that X-ray pictures can be taken during the test. Electromyogram This test measures the activity of the nerves and muscles in your bladder and in the tube that empties your bladder. Sticky patches (electrodes) will be placed on your body to measure electrical activity. What happens after the testing?  You should be able to go home right away.  You can do your usual activities.  You may be told to drink a glass of water every 30 minutes. Do this for the first 2 hours you are home.  Taking a warm bath or using a warm, wet towel may relieve any discomfort. Let your doctor know if you have:  Pain.  Blood in your pee.  Chills.  Fever. What do my test results mean? Talk   with your doctor about what your results mean. These test results can help your doctor find out how well your bladder and the tube that empties your bladder are working. Your results and your symptoms can help your doctor find what might be causing your problems. Questions to ask your doctor Ask your doctor, or the department that is doing the test:  When will my results be ready?  How will I get my results?  What are my treatment options?  What other tests do I need?  What are my next steps? Summary  Urodynamic testing is a set of tests and X-rays.  These tests help to find out how well your bladder and the  part of your body that drains pee from the bladder are working.  Your results and your symptoms can help your doctor find what might be causing your problems.  Talk with your doctor about what your results mean. This information is not intended to replace advice given to you by your health care provider. Make sure you discuss any questions you have with your health care provider. Document Released: 04/27/2008 Document Revised: 03/19/2017 Document Reviewed: 03/19/2017 Elsevier Interactive Patient Education  2019 Elsevier Inc.  

## 2018-11-13 LAB — MICROSCOPIC EXAMINATION
Epithelial Cells (non renal): NONE SEEN /hpf (ref 0–10)
WBC, UA: >30 /hpf — AB (ref 0–5)

## 2018-11-13 LAB — URINALYSIS, COMPLETE
Bilirubin, UA: NEGATIVE
Glucose, UA: NEGATIVE
Ketones, UA: NEGATIVE
Nitrite, UA: NEGATIVE
Specific Gravity, UA: 1.015 (ref 1.005–1.030)
Urobilinogen, Ur: 0.2 mg/dL (ref 0.2–1.0)
pH, UA: 6 (ref 5.0–7.5)

## 2018-11-15 ENCOUNTER — Other Ambulatory Visit: Payer: Self-pay | Admitting: *Deleted

## 2018-11-18 ENCOUNTER — Other Ambulatory Visit: Payer: Self-pay | Admitting: Oncology

## 2018-11-18 NOTE — Telephone Encounter (Signed)
I refused the refill last week and it was sent to me again. He is not on any treatment should not need it anymore.

## 2018-11-19 ENCOUNTER — Other Ambulatory Visit: Payer: Self-pay | Admitting: Urology

## 2018-11-19 DIAGNOSIS — R3914 Feeling of incomplete bladder emptying: Secondary | ICD-10-CM | POA: Diagnosis not present

## 2018-11-20 ENCOUNTER — Other Ambulatory Visit: Payer: Self-pay | Admitting: Urology

## 2018-11-20 DIAGNOSIS — N5089 Other specified disorders of the male genital organs: Secondary | ICD-10-CM

## 2018-11-21 ENCOUNTER — Telehealth: Payer: Self-pay | Admitting: *Deleted

## 2018-11-21 ENCOUNTER — Other Ambulatory Visit: Payer: Self-pay

## 2018-11-21 MED ORDER — ONDANSETRON HCL 8 MG PO TABS: 8.0000 mg | ORAL_TABLET | Freq: Two times a day (BID) | ORAL | 0 refills | Status: DC | PRN

## 2018-11-21 NOTE — Telephone Encounter (Signed)
Patient is no longer on treatment, but Dr. [name] agrees to refill. Refill sent and patient notified.

## 2018-11-21 NOTE — Telephone Encounter (Signed)
Wife called asking why doctor will not refill his Ondansetron. She said he needs it and would like a refill. Please advise

## 2018-11-22 ENCOUNTER — Ambulatory Visit
Admission: RE | Admit: 2018-11-22 | Discharge: 2018-11-22 | Disposition: A | Discharge status: Home / Self Care | Payer: PPO | Source: Ambulatory Visit | Attending: Urology | Admitting: Urology

## 2018-11-22 ENCOUNTER — Other Ambulatory Visit: Payer: Self-pay

## 2018-11-22 DIAGNOSIS — N401 Enlarged prostate with lower urinary tract symptoms: Secondary | ICD-10-CM | POA: Insufficient documentation

## 2018-11-22 DIAGNOSIS — N5089 Other specified disorders of the male genital organs: Secondary | ICD-10-CM | POA: Insufficient documentation

## 2018-11-22 DIAGNOSIS — N138 Other obstructive and reflux uropathy: Secondary | ICD-10-CM

## 2018-11-22 DIAGNOSIS — N323 Diverticulum of bladder: Secondary | ICD-10-CM | POA: Diagnosis not present

## 2018-11-22 DIAGNOSIS — N503 Cyst of epididymis: Secondary | ICD-10-CM | POA: Diagnosis not present

## 2018-11-25 ENCOUNTER — Other Ambulatory Visit: Payer: Self-pay | Admitting: *Deleted

## 2018-11-25 MED ORDER — LEVOTHYROXINE SODIUM 175 MCG PO TABS: 175.0000 ug | ORAL_TABLET | Freq: Every day | ORAL | 3 refills | Status: DC

## 2018-11-26 ENCOUNTER — Other Ambulatory Visit: Payer: Self-pay

## 2018-11-26 ENCOUNTER — Encounter: Payer: Self-pay | Admitting: Urology

## 2018-11-26 ENCOUNTER — Ambulatory Visit (INDEPENDENT_AMBULATORY_CARE_PROVIDER_SITE_OTHER): Payer: PPO | Admitting: Urology

## 2018-11-26 VITALS — BP 134/88 | HR 81 | Ht 73.0 in | Wt 239.0 lb

## 2018-11-26 DIAGNOSIS — N401 Enlarged prostate with lower urinary tract symptoms: Secondary | ICD-10-CM

## 2018-11-26 DIAGNOSIS — R3914 Feeling of incomplete bladder emptying: Secondary | ICD-10-CM

## 2018-11-26 NOTE — Progress Notes (Signed)
   11/26/2018 4:00 PM   Josef Tourigny III [birth year] [id number]  Reason for visit: Follow up urodynamic results  I saw Mr. Kaucher back in urology clinic today to discuss his urodynamic results.  Briefly, he is a 73 year old male who underwent a TURP 10 years ago with Dr. Jacqlyn Larsen when he was in retention with bilateral hydroureteronephrosis, and was recently referred to me for evaluation of an asymptomatic scrotal mass.  Scrotal ultrasound showed a benign epididymal cyst that does not require further work-up or excision.  However, he was found to have an elevated PVR of 750 cc, and on cystoscopy possible regrowth of the anterior prostate causing obstruction.  We reviewed his urodynamic results at length today.  This demonstrated first sensation at 193 mL's, normal desire at 449 mL's, strong urge at 777 mL's, and max capacity of 1200 cc.  He was able to void 188 mL's with a post void residual of 820 mL's.  He generated a good bladder pressure of 62 cm of water.  Max flow was a spike of 15 mL/s with an abdominal contraction, and average flow was 6 mL/s.  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  We reviewed these results at length today.  I did recommend pursuing a TURP in the setting of incomplete emptying with significantly elevated PVRs >700cc to prevent worsening renal function, recurrent UTIs, and urinary retention.  He is amenable to pursuing TURP in the future, however his wife has an upcoming surgery and he would like to get her through that before undergoing any procedure himself.  He would like to follow-up in 4 to 6 weeks to further discuss TURP.  We also discussed CIC as a temporizing option, however he does not want to attempt CIC at this time.  RTC 4 to 6 weeks to discuss TURP again  A total of 15 minutes were spent face-to-face with the patient, greater than 50% was spent in patient education, counseling, and coordination of care regarding urodynamic  results and TURP.   [name], [facility] [address] [place], [place] [address] [phone]

## 2018-12-23 ENCOUNTER — Other Ambulatory Visit: Payer: Self-pay | Admitting: *Deleted

## 2018-12-25 MED ORDER — TAMSULOSIN HCL 0.4 MG PO CAPS: 0.4000 mg | ORAL_CAPSULE | Freq: Every day | ORAL | 1 refills | Status: DC

## 2018-12-25 MED ORDER — BUDESONIDE-FORMOTEROL FUMARATE 160-4.5 MCG/ACT IN AERO: 2.0000 | INHALATION_SPRAY | Freq: Two times a day (BID) | RESPIRATORY_TRACT | 1 refills | Status: DC

## 2018-12-25 NOTE — Telephone Encounter (Signed)
Please ask if patient can get these Rx filled with his primary physician. If he does not have appt, I can fill one month supply for him.

## 2018-12-25 NOTE — Telephone Encounter (Signed)
Sees PCP on 02/12/19.  Okay to refill for 2 mths?

## 2019-01-06 ENCOUNTER — Other Ambulatory Visit: Payer: Self-pay

## 2019-01-07 ENCOUNTER — Inpatient Hospital Stay: Payer: PPO | Attending: Oncology

## 2019-01-07 ENCOUNTER — Other Ambulatory Visit: Payer: Self-pay

## 2019-01-07 DIAGNOSIS — C3492 Malignant neoplasm of unspecified part of left bronchus or lung: Secondary | ICD-10-CM | POA: Diagnosis not present

## 2019-01-07 DIAGNOSIS — Z452 Encounter for adjustment and management of vascular access device: Secondary | ICD-10-CM | POA: Diagnosis not present

## 2019-01-07 DIAGNOSIS — Z95828 Presence of other vascular implants and grafts: Secondary | ICD-10-CM

## 2019-01-07 DIAGNOSIS — Z923 Personal history of irradiation: Secondary | ICD-10-CM | POA: Insufficient documentation

## 2019-01-07 DIAGNOSIS — Z9221 Personal history of antineoplastic chemotherapy: Secondary | ICD-10-CM | POA: Insufficient documentation

## 2019-01-07 MED ORDER — SODIUM CHLORIDE 0.9% FLUSH
10.0000 mL | Freq: Once | INTRAVENOUS | Status: AC
  Administered 2019-01-07: 10 mL via INTRAVENOUS
  Filled 2019-01-07: qty 10

## 2019-01-07 MED ORDER — HEPARIN SOD (PORK) LOCK FLUSH 100 UNIT/ML IV SOLN
500.0000 [IU] | Freq: Once | INTRAVENOUS | Status: AC
  Administered 2019-01-07: 500 [IU] via INTRAVENOUS

## 2019-01-09 ENCOUNTER — Encounter: Payer: Self-pay | Admitting: Urology

## 2019-01-09 ENCOUNTER — Ambulatory Visit: Payer: PPO | Admitting: Urology

## 2019-01-09 ENCOUNTER — Other Ambulatory Visit: Payer: Self-pay

## 2019-01-09 VITALS — BP 129/87 | HR 96 | Ht 73.0 in | Wt 234.0 lb

## 2019-01-09 DIAGNOSIS — N401 Enlarged prostate with lower urinary tract symptoms: Secondary | ICD-10-CM | POA: Diagnosis not present

## 2019-01-09 DIAGNOSIS — R3914 Feeling of incomplete bladder emptying: Secondary | ICD-10-CM | POA: Diagnosis not present

## 2019-01-09 LAB — BLADDER SCAN AMB NON-IMAGING

## 2019-01-09 NOTE — Progress Notes (Signed)
   01/09/2019 3:41 PM   Marc Gray 1946-04-26 [id number]  Reason for visit: Discuss TURP  HPI: I saw [name] back in urology clinic today for rediscussion of TURP.  To briefly summarize he is a 73 year old male with history of urinary retention with bilateral hydroureteronephrosis in 2013 that was treated with TURP by Dr. Jacqlyn Larsen with significant improvement in his urinary symptoms, and resolution of his hydronephrosis.  He was found to have significantly elevated PVRs of greater than 700 cc recently, and has undergone thorough urology work-up.  Cystoscopy showed some residual obstructive anterior prostate tissue, and a massively distended bladder with trabeculations.  Urodynamics showed a functional bladder with normal desire at 449 mL, strong urge at 777 ml, max capacity of 1200 cc, high pressure voiding of 62 cm of water, average flow of 6 mL's per second, PVR of 820 mL's, and confirmed bladder outlet obstruction.  PVR in clinic today remains elevated 570 cc.  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  Renal function has declined over the last year from creatinine of 1.38 in January 2020 with a EGFR 51, steadily to creatinine 1.73, EGFR 45 in June 2020.  PSA was normal on 08/2018 at 1.44.  We had a long conversation about treatment options including observation with potential for renal damage or UTIs in the future, clean intermittent catheterization, or TURP.  We discussed the risks and benefits of all these options at length.  I strongly recommended TURP in the setting of his significantly elevated PVRs, and history of retention with bilateral hydroureteronephrosis and renal failure.  We discussed the risks and benefits of surgery including bleeding, infection, ongoing incomplete emptying, incontinence, need for temporary Foley catheter, and possible 1 night hospital stay.  Schedule bipolar TURP  A total of 15 minutes were spent face-to-face with  the patient, greater than 50% was spent in patient education, counseling, and coordination of care regarding BPH and surgical options for bladder outlet obstruction.   [name], [facility] [address] [place], [place] [address] [phone]

## 2019-01-09 NOTE — Patient Instructions (Addendum)
Transurethral Resection of the Prostate Transurethral resection of the prostate (TURP) is the removal (resection) of part of the gland that produces semen (prostate gland). This procedure is done to treat benign prostatic hyperplasia (BPH). BPH is an abnormal, noncancerous (benign) increase in the number of cells that make up the prostate tissue. BPH causes the prostate to get bigger. The enlarged prostate can push against or block the tube that drains urine from the bladder out of the body (urethra). BPH can affect normal urine flow by causing bladder infections, difficulty controlling bladder function, and difficulty emptying the bladder.  The goal of TURP is to remove enough prostate tissue to allow for a normal flow of urine. The procedure will allow you to empty your bladder more completely when you urinate so that you can urinate less often. In a transurethral resection, a thin telescope with a light, a tiny camera, and an electric cutting edge (resectoscope) is passed through the urethra and into the prostate. The opening of the urethra is at the end of the penis. Tell a health care provider about:  Any allergies you have.  All medicines you are taking, including vitamins, herbs, eye drops, creams, and over-the-counter medicines.  Any problems you or family members have had with anesthetic medicines.  Any blood disorders you have.  Any surgeries you have had.  Any medical conditions you have.  Any prostate infections you have had. What are the risks? Generally, this is a safe procedure. However, problems may occur, including:  Infection.  Bleeding.  Allergic reactions to medicines.  Damage to other structures or organs, such as: ? The urethra. ? The bladder. ? Muscles that surround the prostate.  Difficulty getting an erection.  Inability to control when you urinate (incontinence).  Scarring, which may cause problems with urine flow. What happens before the procedure?  Medicines Ask your health care provider about:  Changing or stopping your regular medicines. This is especially important if you are taking diabetes medicines or blood thinners.  Taking medicines such as aspirin and ibuprofen. These medicines can thin your blood. Do not take these medicines unless your health care provider tells you to take them.  Taking over-the-counter medicines, vitamins, herbs, and supplements. Eating and drinking Follow instructions from your health care provider about eating and drinking, which may include:  8 hours before the procedure - stop eating heavy meals or foods, such as meat, fried foods, or fatty foods.  6 hours before the procedure - stop eating light meals or foods, such as toast or cereal.  6 hours before the procedure - stop drinking milk or drinks that contain milk.  2 hours before the procedure - stop drinking clear liquids. Staying hydrated Follow instructions from your health care provider about hydration, which may include:  Up to 2 hours before the procedure - you may continue to drink clear liquids, such as water, clear fruit juice, black coffee, and plain tea. General instructions  You may have a physical exam.  You may have a blood or urine sample taken.  Ask your health care provider what steps will be taken to help prevent infection. These may include: ? Washing skin with a germ-killing soap. ? Taking antibiotic medicine.  Plan to have someone take you home from the hospital or clinic. You may not be able to drive for up to 10 days after your procedure.  Plan to have a responsible adult care for you for at least 24 hours after you leave the hospital or  clinic. This is important. What happens during the procedure?   An IV will be inserted into one of your veins.  You will be given one or more of the following: ? A medicine to help you relax (sedative). ? A medicine to make you fall asleep (general anesthetic). ? A medicine  that is injected into your spine to numb the area below and slightly above the injection site (spinal anesthetic).  Your legs will be placed in foot rests (stirrups) so that your legs are apart and your knees are bent.  The resectoscope will be passed through your urethra to your prostate.  Parts of your prostate will be resected using the cutting edge of the resectoscope.  The resectoscope will be removed.  A small, thin tube (catheter) will be passed through your urethra and into your bladder. The catheter will drain urine into a bag outside of your body. ? Fluid may be passed through the catheter to keep the catheter open. The procedure may vary among health care providers and hospitals. What happens after the procedure?  Your blood pressure, heart rate, breathing rate, and blood oxygen level will be monitored until you leave the hospital or clinic.  You may continue to receive fluids and medicines through an IV.  You may have some pain. Pain medicine will be available to help you.  You will have a catheter draining your urine. ? You may have blood in your urine. Your catheter may be kept in until your urine is clear. ? Your urinary drainage will be monitored. If necessary, your bladder may be rinsed out (irrigated) through your catheter.  You will be encouraged to walk around as soon as possible.  You may have to wear compression stockings. These stockings help prevent blood clots and reduce swelling in your legs.  Do not drive for 24 hours if you were given a sedative during your procedure. Summary  Transurethral resection of the prostate (TURP) is the removal (resection) of part of the gland that produces semen (prostate gland).  The goal of this procedure is to remove enough prostate tissue to allow for a normal flow of urine.  Follow instructions from your health care provider about taking medicines and about eating and drinking before the procedure. This information is  not intended to replace advice given to you by your health care provider. Make sure you discuss any questions you have with your health care provider. Document Released: 05/15/2005 Document Revised: 09/04/2018 Document Reviewed: 02/13/2018 Elsevier Patient Education  2020 [employer].   Transurethral Resection of the Prostate, Care After This sheet gives you information about how to care for yourself after your procedure. Your health care provider may also give you more specific instructions. If you have problems or questions, contact your health care provider. What can I expect after the procedure? After the procedure, it is common to have:  Mild pain in your lower abdomen.  Soreness or mild discomfort in your penis from having the catheter inserted during the procedure.  A feeling of urgency when you need to urinate.  A small amount of blood in your urine. You may notice some small blood clots in your urine. These are normal. Follow these instructions at home: Medicines  Take over-the-counter and prescription medicines only as told by your health care provider.  If you were prescribed an antibiotic medicine, take it as told by your health care provider. Do not stop taking the antibiotic even if you start to feel better.  Ask  your health care provider if the medicine prescribed to you: ? Requires you to avoid driving or using heavy machinery. ? Can cause constipation. You may need to take actions to prevent or treat constipation, such as:  Take over-the-counter or prescription medicines.  Eat foods that are high in fiber, such as fresh fruits and vegetables, whole grains, and beans.  Limit foods that are high in fat and processed sugars, such as fried or sweet foods.  Do not drive for 24 hours if you were given a sedative during your procedure. Activity   Return to your normal activities as told by your health care provider. Ask your health care provider what activities are  safe for you.  Do not lift anything that is heavier than 10 lb (4.5 kg), or the limit that you are told, for 3 weeks after the procedure or until your health care provider says that it is safe.  Avoid intense physical activity for as long as told by your health care provider.  Avoid sitting for a long time without moving. Get up and move around one or more times every few hours. This helps to prevent blood clots. You may increase your physical activity gradually as you start to feel better. Lifestyle  Do not drink alcohol for as long as told by your health care provider. This is especially important if you are taking prescription pain medicines.  Do not engage in sexual activity until your health care provider says that you can do this. General instructions   Do not take baths, swim, or use a hot tub until your health care provider approves.  Drink enough fluid to keep your urine pale yellow.  Urinate as soon as you feel the need to. Do not try to hold your urine for long periods of time.  If your health care provider approves, you may take a stool softener for 2-3 weeks to prevent you from straining to have a bowel movement.  Wear compression stockings as told by your health care provider. These stockings help to prevent blood clots and reduce swelling in your legs.  Keep all follow-up visits as told by your health care provider. This is important. Contact a health care provider if you have:  Difficulty urinating.  A fever.  Pain that gets worse or does not improve with medicine.  Blood in your urine that does not go away after 1 week of resting and drinking more fluids.  Swelling in your penis or testicles. Get help right away if:  You are unable to urinate.  You are having more blood clots in your urine instead of fewer.  You have: ? Large blood clots. ? A lot of blood in your urine. ? Pain in your back or lower abdomen. ? Pain or swelling in your legs. ? Chills and  you are shaking. ? Difficulty breathing or shortness of breath. Summary  After the procedure, it is common to have a small amount of blood in your urine.  Avoid heavy lifting and intense physical activity for as long as told by your health care provider.  Urinate as soon as you feel the need to. Do not try to hold your urine for long periods of time.  Keep all follow-up visits as told by your health care provider. This is important. This information is not intended to replace advice given to you by your health care provider. Make sure you discuss any questions you have with your health care provider. Document Released: 05/15/2005 Document  Revised: 09/04/2018 Document Reviewed: 02/13/2018 Elsevier Patient Education  [employer].

## 2019-01-10 DIAGNOSIS — E2749 Other adrenocortical insufficiency: Secondary | ICD-10-CM | POA: Diagnosis not present

## 2019-01-17 DIAGNOSIS — E2749 Other adrenocortical insufficiency: Secondary | ICD-10-CM | POA: Diagnosis not present

## 2019-01-17 DIAGNOSIS — E039 Hypothyroidism, unspecified: Secondary | ICD-10-CM | POA: Diagnosis not present

## 2019-01-24 ENCOUNTER — Telehealth: Payer: Self-pay

## 2019-01-24 NOTE — Telephone Encounter (Signed)
Telephone call to patient to discuss Survivorship Care Plan visit.  Patient in agreement for packet to be mailed today and I will call him on 02/06/2019 at 3:00 pm to review SCP and treatment summary and discuss post cancer care.

## 2019-01-30 ENCOUNTER — Telehealth: Payer: Self-pay | Admitting: Radiology

## 2019-01-30 NOTE — Telephone Encounter (Signed)
LMOM to notify patient to hold aspirin for 7 days prior to surgery beginning on 02/07/2019.

## 2019-02-04 ENCOUNTER — Other Ambulatory Visit: Payer: Self-pay

## 2019-02-04 ENCOUNTER — Ambulatory Visit
Admission: RE | Admit: 2019-02-04 | Discharge: 2019-02-04 | Disposition: A | Discharge status: Home / Self Care | Payer: PPO | Source: Ambulatory Visit | Attending: Oncology | Admitting: Oncology

## 2019-02-04 DIAGNOSIS — C3492 Malignant neoplasm of unspecified part of left bronchus or lung: Secondary | ICD-10-CM | POA: Diagnosis not present

## 2019-02-04 DIAGNOSIS — I712 Thoracic aortic aneurysm, without rupture: Secondary | ICD-10-CM | POA: Diagnosis not present

## 2019-02-04 DIAGNOSIS — R590 Localized enlarged lymph nodes: Secondary | ICD-10-CM | POA: Diagnosis not present

## 2019-02-04 DIAGNOSIS — I700 Atherosclerosis of aorta: Secondary | ICD-10-CM | POA: Diagnosis not present

## 2019-02-04 DIAGNOSIS — R918 Other nonspecific abnormal finding of lung field: Secondary | ICD-10-CM | POA: Diagnosis not present

## 2019-02-04 DIAGNOSIS — I2510 Atherosclerotic heart disease of native coronary artery without angina pectoris: Secondary | ICD-10-CM | POA: Diagnosis not present

## 2019-02-04 DIAGNOSIS — J432 Centrilobular emphysema: Secondary | ICD-10-CM | POA: Diagnosis not present

## 2019-02-05 ENCOUNTER — Other Ambulatory Visit: Payer: PPO

## 2019-02-05 ENCOUNTER — Other Ambulatory Visit: Payer: Self-pay

## 2019-02-05 DIAGNOSIS — N401 Enlarged prostate with lower urinary tract symptoms: Secondary | ICD-10-CM | POA: Diagnosis not present

## 2019-02-05 DIAGNOSIS — R3914 Feeling of incomplete bladder emptying: Secondary | ICD-10-CM | POA: Diagnosis not present

## 2019-02-06 ENCOUNTER — Inpatient Hospital Stay: Payer: PPO | Attending: Oncology | Admitting: Nurse Practitioner

## 2019-02-06 DIAGNOSIS — Z7952 Long term (current) use of systemic steroids: Secondary | ICD-10-CM | POA: Insufficient documentation

## 2019-02-06 DIAGNOSIS — I129 Hypertensive chronic kidney disease with stage 1 through stage 4 chronic kidney disease, or unspecified chronic kidney disease: Secondary | ICD-10-CM | POA: Insufficient documentation

## 2019-02-06 DIAGNOSIS — K219 Gastro-esophageal reflux disease without esophagitis: Secondary | ICD-10-CM | POA: Insufficient documentation

## 2019-02-06 DIAGNOSIS — R635 Abnormal weight gain: Secondary | ICD-10-CM | POA: Insufficient documentation

## 2019-02-06 DIAGNOSIS — J449 Chronic obstructive pulmonary disease, unspecified: Secondary | ICD-10-CM | POA: Insufficient documentation

## 2019-02-06 DIAGNOSIS — N189 Chronic kidney disease, unspecified: Secondary | ICD-10-CM | POA: Insufficient documentation

## 2019-02-06 DIAGNOSIS — Z87891 Personal history of nicotine dependence: Secondary | ICD-10-CM

## 2019-02-06 DIAGNOSIS — F411 Generalized anxiety disorder: Secondary | ICD-10-CM | POA: Insufficient documentation

## 2019-02-06 DIAGNOSIS — Z7951 Long term (current) use of inhaled steroids: Secondary | ICD-10-CM | POA: Insufficient documentation

## 2019-02-06 DIAGNOSIS — C3492 Malignant neoplasm of unspecified part of left bronchus or lung: Secondary | ICD-10-CM | POA: Insufficient documentation

## 2019-02-06 DIAGNOSIS — R339 Retention of urine, unspecified: Secondary | ICD-10-CM | POA: Insufficient documentation

## 2019-02-06 DIAGNOSIS — Z803 Family history of malignant neoplasm of breast: Secondary | ICD-10-CM | POA: Insufficient documentation

## 2019-02-06 DIAGNOSIS — F418 Other specified anxiety disorders: Secondary | ICD-10-CM | POA: Diagnosis not present

## 2019-02-06 DIAGNOSIS — E2740 Unspecified adrenocortical insufficiency: Secondary | ICD-10-CM | POA: Insufficient documentation

## 2019-02-06 DIAGNOSIS — E039 Hypothyroidism, unspecified: Secondary | ICD-10-CM | POA: Insufficient documentation

## 2019-02-06 DIAGNOSIS — N400 Enlarged prostate without lower urinary tract symptoms: Secondary | ICD-10-CM | POA: Insufficient documentation

## 2019-02-06 DIAGNOSIS — D472 Monoclonal gammopathy: Secondary | ICD-10-CM | POA: Insufficient documentation

## 2019-02-06 DIAGNOSIS — Z7982 Long term (current) use of aspirin: Secondary | ICD-10-CM | POA: Insufficient documentation

## 2019-02-06 DIAGNOSIS — F329 Major depressive disorder, single episode, unspecified: Secondary | ICD-10-CM | POA: Insufficient documentation

## 2019-02-06 DIAGNOSIS — Z79899 Other long term (current) drug therapy: Secondary | ICD-10-CM | POA: Insufficient documentation

## 2019-02-06 LAB — URINALYSIS, COMPLETE
Bilirubin, UA: NEGATIVE
Glucose, UA: NEGATIVE
Ketones, UA: NEGATIVE
Leukocytes,UA: NEGATIVE
Nitrite, UA: NEGATIVE
Protein,UA: NEGATIVE
RBC, UA: NEGATIVE
Specific Gravity, UA: 1.02 (ref 1.005–1.030)
Urobilinogen, Ur: 0.2 mg/dL (ref 0.2–1.0)
pH, UA: 5.5 (ref 5.0–7.5)

## 2019-02-06 LAB — MICROSCOPIC EXAMINATION
Bacteria, UA: NONE SEEN
RBC, Urine: NONE SEEN /hpf (ref 0–2)

## 2019-02-06 MED ORDER — SERTRALINE HCL 25 MG PO TABS: 25.0000 mg | ORAL_TABLET | Freq: Every day | ORAL | 0 refills | Status: DC

## 2019-02-06 NOTE — Progress Notes (Signed)
Survivorship Care Plan visit completed.  Treatment summary reviewed and previously mailed to patient.  ASCO answers booklet reviewed and given to patient.  CARE program and Cancer Transitions discussed with patient along with other resources cancer center offers to patients and caregivers.  Patient verbalized understanding.

## 2019-02-06 NOTE — Progress Notes (Signed)
Virtual Visit Progress Note  Survivorship Clinic Consult Note [facility]  [phone] [phone]  Patient Care Team: Marc Pink, MD as PCP - General (Family Medicine) Marc Gray, Marc Gleason, MD (General Surgery) Marc Server, MD as Consulting Physician (Oncology) Marc Nab, RN as Registered Nurse   Name of the patient: Marc Gray  [id number]  January 21, 1946   Date of visit: 02/06/19  CLINIC:  Survivorship  REASON FOR VISIT:  [facility] visit & to address acute survivorship needs   I connected with Marc Gray on 02/06/19 at  3:00 PM EDT by telephone visit and verified that I am speaking with the correct person using two identifiers.   I discussed the limitations, risks, security and privacy concerns of performing an evaluation and management service by telemedicine and the availability of in-person appointments. I also discussed with the patient that there may be a patient responsible charge related to this service. The patient expressed understanding and agreed to proceed.   Other persons participating in the visit and their role in the encounter: Marc Patricia, RN (review care plan)  Patients location: home Providers location: clinic  Chief Complaint: Stage IIIb adenocarcinoma of the lung  BRIEF ONCOLOGY HISTORY: Oncology History Overview Note  Patient was initially referred by Dr. [place] Gray (PCP) for evaluation of neck mass that had appeared in approximately 06/2016.  CT showed multiple cervical/supraclavicular lymphadenopathy as well as left upper lobe mass.  He had 53-year pack history and was current daily smoker at that time. 02/09/17-biopsy  DIAGNOSIS:  A. LYMPH NODE, LEFT CERVICAL; EXCISION:  - METASTATIC ADENOCARCINOMA OF LUNG ORIGIN Positive- TTF-1 Negative - p40  02/12/17- PET IMPRESSION: 1. Hypermetabolic left upper lobe mass with involvement of the left hilum and superior segment  left lower lobe as well as hypermetabolic mediastinal, right hilar and supraclavicular adenopathy, most consistent with primary bronchogenic carcinoma (at least T2bN3M0 or stage IIIB disease). 2. Vague hypermetabolism in the right adrenal gland without definite CT correlate. 3. Small pericardial effusion. 4. A few scattered areas of peribronchovascular ground-glass are nonspecific and may be infectious or inflammatory in etiology. Metastatic disease cannot be definitively excluded. 5. Aortic atherosclerosis (ICD10-170.0). Coronary artery calcification.  02/12/17- MR BRAIN W WO CONTRAST IMPRESSION: 1. Negative for metastatic disease 2. Chronic microvascular ischemic changes in the white matter 3. Sinus mucosal disease.  Case was discussed at Multi-Disciplinary case conference and concurrent chemoradiation was recommended.   He was evaluated by Dr. Baruch Gray with radiation oncology who recommended concurrent chemoradiation and based on the large size of his tumor bilateral aspect he recommended treatment with initial course of radiation to 4140 cGy in 23 fractions followed by 1 week treatment break and small field boost following.  Initiated chemotherapy with carboplatin and taxol on 03/06/17, and completed 6 cycles on 04/10/2017.   Completed lung boost radiation on 05/21/17.   06/04/2017- CT CHEST W CONTRAST IMPRESSION: 1. Today's study demonstrates a positive response to therapy with some decrease in size of the large mass in the left upper lobe, as well as regression of the previously noted mediastinal and hilar lymphadenopathy. 2. New patchy areas of ground-glass attenuation and septal thickening in the lungs bilaterally. This is favored to be a treatment response, likely evolving postradiation changes. In the midst of these areas there is a somewhat nodular focus of ground-glass attenuation and septal thickening measuring 1.6 x 2.1 cm (axial image 91 of series 3) which warrants close attention  on follow-up studies. 3. Interval  development of thickening of the proximal third of the esophagus, also likely to reflect post treatment radiation esophagitis. Clinical correlation is recommended. 4. Slight increase in small small amount of pericardial fluid and/or thickening, also likely treatment related. 5. Mild diffuse bronchial wall thickening with mild centrilobular and paraseptal emphysema; imaging findings suggestive of underlying COPD. 6. Aortic atherosclerosis, in addition to left main and 3 vessel coronary artery disease. Please note that although the presence of coronary artery calcium documents the presence of coronary artery disease, the severity of this disease and any potential stenosis cannot be assessed on this non-gated CT examination. Assessment for potential risk factor modification, dietary therapy or pharmacologic therapy may be warranted, if clinically indicated. Aortic Atherosclerosis (ICD10-I70.0) and Emphysema (ICD10-J43.9).  Initiated maintenance durvalumab on 06/07/17.   09/26/2017- CT CHEST W CONTRAST IMPRESSION: 1. Left upper lobe mass appears slightly smaller, with associated evolutionary changes of radiation therapy in the left hemithorax.  2. New/progressive areas of irregular consolidation in the left lower lobe, most likely treatment related. 3. Trace loculated left pleural fluid. 4. Pericardial effusion, stable. 5. Aortic atherosclerosis (ICD10-170.0). Coronary artery calcification. 6. Aortic aneurysm NOS (ICD10-I71.9) Recommend annual imaging followup by CTA or MRA. This recommendation follows 2010 ACCF/AHA/AATS/ACR/ASA/SCA/SCAI/SIR/STS/SVM Guidelines for the Diagnosis  and Management of Patients with Thoracic Aortic Disease. Circulation. 2010; 121: D782-U235. 7.  Emphysema (ICD10-J43.9). 8. Cholelithiasis versus gallbladder wall calcification.   Adenocarcinoma of left lung, stage 3 ([place])  02/14/2017 Initial Diagnosis   Adenocarcinoma of left lung, stage 3  (H[facility])     INTERVAL HISTORY: Marc Gray, "[name]", 73 year old male with above history of stage IIIb adenocarcinoma of the lung who presents to the survivorship clinic today for initial meeting to review her survivorship care plan detailing her treatment course for lung cancer, as well as monitoring long-term side effects of the treatment, education regarding health maintenance, screening, and overall wellness and health promotion.    Overall he feels well since completing treatment.  Today, he complains of significant anxiety which impairs his sleep and sometimes he feels overwhelmed with worry that his cancer will come back.  He also complains of rash around his groin and hand pain.  Rash started approximately 1 week ago and has persisted since that time.  He has tried putting lotion on the area without improvement in symptoms.  He has not sought evaluation for this yet.  He also complains of hand pain which started a few weeks ago and has persisted since that time.  He questions if this could be secondary to immunotherapy.  Last dose of Imfinzi on 06/25/2018.    He is followed by Dr. Gabriel Carina for history of adrenal insufficiency. He continues prednisone for adrenal insufficiency and denies symptoms.   He has chronic urinary issues and is followed by Dr. Diamantina Providence with plan for TURP in the future.   He has a history of MGUS and is followed by Dr. [name].   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 [name]. Last TSH was 1.940 on 02/07/2019. He continues Synthroid 175 mcg daily.   His PCP is Dr. Kary Kos whom he hasn't seen recently due to COVID-19 pandemic but says he has an upcoming appointment.   Review of Systems  Constitutional: Negative for chills, fever, malaise/fatigue and weight loss.  HENT: Negative for congestion, sore throat and tinnitus.   Eyes: Negative for blurred vision and redness.  Respiratory: Positive for cough. Negative for  hemoptysis, sputum production, shortness  of breath and wheezing.   Cardiovascular: Negative for chest pain and palpitations.  Gastrointestinal: Negative for abdominal pain, blood in stool, constipation, diarrhea, melena and nausea.  Genitourinary: Negative for dysuria.       Per hpi; TURP planned for 02/14/2019  Musculoskeletal: Positive for joint pain. Negative for falls.  Skin: Positive for itching and rash.  Neurological: Negative for dizziness, sensory change, weakness and headaches.  Endo/Heme/Allergies: Negative for environmental allergies. Does not bruise/bleed easily.  Psychiatric/Behavioral: Positive for depression. The patient is nervous/anxious and has insomnia.     PAST MEDICAL & SURGICAL HISTORY:  Past Medical History:  Diagnosis Date   Anxiety    Arthritis    SHOULDER   Cataract    Colon cancer ([place]) 2015   Pt states during his colonoscopy he had cancerous polyps removed.    Depression    SINCE DIAGNOSIS   Dyspnea    DOE   Enlarged prostate    Hypertension    Lung cancer [facility])    Aug 2018   Pain    DUE TO LUNG ISSUE  Past medical history reviewed with patient today  Past Surgical History:  Procedure Laterality Date   CATARACT EXTRACTION W/ INTRAOCULAR LENS  IMPLANT, BILATERAL     COLONOSCOPY     EYE SURGERY     LYMPH GLAND EXCISION N/A 02/09/2017   Procedure: CERVICAL LYMPH NODE BIOPSY;  Surgeon: Robert Bellow, MD;  Location: [facility] ORS;  Service: General;  Laterality: N/A;   PORTACATH PLACEMENT Right 02/23/2017   Procedure: INSERTION PORT-A-CATH;  Surgeon: Robert Bellow, MD;  Location: [facility] ORS;  Service: General;  Laterality: Right;   PROSTATE SURGERY     TONSILLECTOMY      SOCIAL HISTORY:  Social History   Socioeconomic History   Marital status: Married    Spouse name: Not on file   Number of children: Not on file   Years of education: Not on file   Highest education level: Not on file  Occupational History   Not on  file  Social Needs   Financial resource strain: Not on file   Food insecurity    Worry: Not on file    Inability: Not on file   Transportation needs    Medical: Not on file    Non-medical: Not on file  Tobacco Use   Smoking status: Former Smoker    Packs/day: 1.00    Years: 53.00    Pack years: 53.00    Types: Cigarettes    Quit date: 04/14/2017    Years since quitting: 1.8   Smokeless tobacco: Never Used   Tobacco comment: Quit November 2018  Substance and Sexual Activity   Alcohol use: Yes    Comment: occassional   Drug use: No   Sexual activity: Yes  Lifestyle   Physical activity    Days per week: Not on file    Minutes per session: Not on file   Stress: Not on file  Relationships   Social connections    Talks on phone: Not on file    Gets together: Not on file    Attends religious service: Not on file    Active member of club or organization: Not on file    Attends meetings of clubs or organizations: Not on file    Relationship status: Not on file  Other Topics Concern   Not on file  Social History Narrative   Not on file    CURRENT MEDICATIONS:  Current Outpatient Medications  on File Prior to Visit  Medication Sig Dispense Refill   acetaminophen (TYLENOL) 500 MG tablet Take 1,000 mg by mouth every 6 (six) hours as needed for moderate pain.     aspirin 325 MG tablet Take 650 mg by mouth daily.      budesonide-formoterol (SYMBICORT) 160-4.5 MCG/ACT inhaler Inhale 2 puffs into the lungs 2 (two) times daily. 1 Inhaler 1   calcium-vitamin D (OSCAL WITH D) 500-200 MG-UNIT tablet Take 1 tablet by mouth daily. 90 tablet 1   Chlorpheniramine-Phenylephrine (SINUS & ALLERGY) 4-10 MG tablet Take 1 tablet by mouth every 6 (six) hours as needed for allergies.      diphenhydrAMINE-zinc acetate (BENADRYL EXTRA STRENGTH) cream Apply 1 application topically 3 (three) times daily as needed for itching. (Patient not taking: Reported on 02/06/2019) 28 g 0    fluticasone (FLONASE) 50 MCG/ACT nasal spray Place 1 spray into both nostrils daily. 16 g 2   levothyroxine (SYNTHROID) 175 MCG tablet Take 1 tablet (175 mcg total) by mouth daily before breakfast. 30 tablet 3   lidocaine-prilocaine (EMLA) cream Apply over port 1-2 hours prior to chemotherapy treatment.  Cover with plastic wrap. 30 g 1   Melatonin 10 MG TABS Take 10 mg by mouth at bedtime.      Multiple Vitamin (MULTI-VITAMINS) TABS Take 1 tablet by mouth daily.      ondansetron (ZOFRAN) 8 MG tablet Take 1 tablet (8 mg total) by mouth every 12 (twelve) hours as needed for nausea or vomiting. 60 tablet 0   pantoprazole (PROTONIX) 40 MG tablet Take 1 tablet (40 mg total) by mouth daily. 90 tablet 1   predniSONE (DELTASONE) 10 MG tablet Take one tablet each morning. Take one-half tablet each afternoon (take between 2 - 5 PM) (Patient taking differently: Take 5-10 mg by mouth See admin instructions. Take 10 mg by mouth in the morning and 5 mg in the late afternoon (between 2 - 5 PM)) 45 tablet 0   PROAIR HFA 108 (90 Base) MCG/ACT inhaler INHALE 2 PUFFS BY MOUTH EVERY 6 HOURS AS NEEDED (Patient taking differently: Inhale 2 puffs into the lungs every 6 (six) hours as needed for wheezing or shortness of breath. ) 8.5 g 0   prochlorperazine (COMPAZINE) 10 MG tablet Take 1 tablet (10 mg total) by mouth every 6 (six) hours as needed for nausea or vomiting. 60 tablet 2   tadalafil (CIALIS) 5 MG tablet Take 1 tablet (5 mg total) by mouth daily as needed for erectile dysfunction. 30 tablet 11   tamsulosin (FLOMAX) 0.4 MG CAPS capsule Take 1 capsule (0.4 mg total) by mouth daily. 90 capsule 1   tiotropium (SPIRIVA HANDIHALER) 18 MCG inhalation capsule INHALE 1 CAPSULE DAILY (Patient taking differently: Place 18 mcg into inhaler and inhale daily. ) 30 capsule 0   traMADol (ULTRAM) 50 MG tablet Take 1 tablet (50 mg total) by mouth every 6 (six) hours as needed for moderate pain or severe pain. 30 tablet 0     triamcinolone ointment (KENALOG) 0.5 % Apply 1 application topically 3 (three) times daily as needed. 30 g 1   Current Facility-Administered Medications on File Prior to Visit  Medication Dose Route Frequency Provider Last Rate Last Dose   sodium chloride flush (NS) 0.9 % injection 10 mL  10 mL Intravenous PRN Marc Server, MD   10 mL at 04/03/17 0932    ALLERGIES: No Known Allergies   PHYSICAL EXAM:  Exam limited due to telemedicine  LABORATORY DATA:  None for this visit.   DIAGNOSTIC IMAGING:  None for this visit.    ASSESSMENT & PLAN:  Mr. Afonso is a pleasant 73 y.o. male with history of Stage IIIb adenocarcinoma of the lung s/p carbo-Taxol with concurrent radiation with boost currently on durvalumab maintenance. Patient presents to survivorship clinic today for survivorship care plan visit and to address any acute survivorship concerns since completing treatment.   1.  Stage IIIb adenocarcinoma of the lung- Today, he received a copy of his Survivorship Care Plan [facility]) document, which was reviewed with him in detail.  The SCP details his cancer treatment history and potential late/long-term side effects of those treatments.  We discussed the follow-up schedule he can anticipate with interval imaging for surveillance of his cancer.  I have also shared a copy of his treatment summary/SCP with his PCP.  2.  Groin Rash - encourage patient to discuss with Dr. [name] whom he is scheduled to see tomorrow.   3.  Anxiety-chronic but per patient somewhat worse with anxiety secondary to his cancer diagnosis.  I encouraged self referral to counseling services.  He would like to consider medication.  Start Zoloft 25 mg daily and consider up titration in 2 to 4 weeks.   4.  Hand pain- not likely chemotherapy-induced due to onset of symptoms/timing.  Suspect OA vs RA in setting of imfinzi though unlikely since it's been so long since his last tx. I encouraged him to discuss this further with Dr.  [name] or his PCP.   5.  Former smoker: I commended Mr. Merkey continued efforts to remain tobacco-free.  We discussed that one of the most important risk reduction strategies in preventing cancer recurrence in lung cancer patients is smoking cessation.  He is committed to abstaining from tobacco.    6. Physical activity/Healthy eating: Getting adequate physical activity and maintaining a healthy diet as a cancer survivor is important for overall wellness and reduces the risk of cancer recurrence. We discussed the [facility], which is a fitness program that is offered to cancer survivors free of charge.  We also reviewed "The Nutrition Rainbow" handout, as well as the American Cancer Society's booklet with recommendations for nutrition and physical activity.    7. Health promotion/Cancer screening:  Mr. Ransome is followed by his primary care doctor for routine/surveillance colonoscopies, PSAs, skin cancer screenings, and vaccinations.  This information is also detailed in his survivorship care plan.  8. Support services/Counseling: It is not uncommon for this period of the patient's cancer care trajectory to be one of many emotions and stressors.  Mr. Brodowski  was encouraged to take advantage of our many support services programs, support groups, and/or counseling in coping with her new life as a cancer survivor after completing anti-cancer treatment. He was given a calendar of the cancer center's support services events, as well as brochures for our spiritual care and free counseling resources.    Return to clinic  -Return to survivorship clinic as needed; no additional follow-up needed at this time.  -Consider transitioning the patient to long-term survivorship, when clinically appropriate.  - start sertraline 25 mg daily; if tolerated in 2 weeks to 1 month consider up-titrating to 50 mg if needed. No renal adjustments needed per up-to-date.  Follow-up with symptom management  clinic or PCP in 1 month - discussed self-referral to counseling services/Sandy Nicki Reaper  I discussed the assessment and treatment plan with the patient. The patient was provided an opportunity to ask questions and all  were answered. The patient agreed with the plan and demonstrated an understanding of the instructions.   The patient was advised to call back or seek an in-person evaluation if the symptoms worsen or if the condition fails to improve as anticipated.  Case discussed with Dr. [name] who was updated and agreed with plan of care.    I provided 28 minutes of non face-to-face telephone visit time during this encounter, and > 50% was spent counseling as documented under my assessment & plan.  Beckey Rutter, DNP, AGNP-C [place] [facility]

## 2019-02-06 NOTE — Progress Notes (Signed)
Patient is coming in for follow up, he is doing well no major complaints. He is aware of the no visitor policy and no covid symptoms.

## 2019-02-07 ENCOUNTER — Encounter: Payer: Self-pay | Admitting: Oncology

## 2019-02-07 ENCOUNTER — Inpatient Hospital Stay: Payer: PPO

## 2019-02-07 ENCOUNTER — Encounter
Admission: RE | Admit: 2019-02-07 | Discharge: 2019-02-07 | Disposition: A | Discharge status: Home / Self Care | Payer: PPO | Source: Ambulatory Visit | Attending: Urology | Admitting: Urology

## 2019-02-07 ENCOUNTER — Inpatient Hospital Stay (HOSPITAL_BASED_OUTPATIENT_CLINIC_OR_DEPARTMENT_OTHER): Payer: PPO | Admitting: Oncology

## 2019-02-07 ENCOUNTER — Other Ambulatory Visit: Payer: Self-pay

## 2019-02-07 VITALS — BP 157/79 | HR 73 | Temp 96.6°F | Resp 20 | Wt 241.7 lb

## 2019-02-07 DIAGNOSIS — Z79899 Other long term (current) drug therapy: Secondary | ICD-10-CM

## 2019-02-07 DIAGNOSIS — D472 Monoclonal gammopathy: Secondary | ICD-10-CM | POA: Diagnosis not present

## 2019-02-07 DIAGNOSIS — Z01818 Encounter for other preprocedural examination: Secondary | ICD-10-CM | POA: Insufficient documentation

## 2019-02-07 DIAGNOSIS — K219 Gastro-esophageal reflux disease without esophagitis: Secondary | ICD-10-CM | POA: Diagnosis not present

## 2019-02-07 DIAGNOSIS — Z7982 Long term (current) use of aspirin: Secondary | ICD-10-CM | POA: Diagnosis not present

## 2019-02-07 DIAGNOSIS — C3492 Malignant neoplasm of unspecified part of left bronchus or lung: Secondary | ICD-10-CM | POA: Diagnosis not present

## 2019-02-07 DIAGNOSIS — I10 Essential (primary) hypertension: Secondary | ICD-10-CM | POA: Insufficient documentation

## 2019-02-07 DIAGNOSIS — I129 Hypertensive chronic kidney disease with stage 1 through stage 4 chronic kidney disease, or unspecified chronic kidney disease: Secondary | ICD-10-CM

## 2019-02-07 DIAGNOSIS — Z7951 Long term (current) use of inhaled steroids: Secondary | ICD-10-CM | POA: Diagnosis not present

## 2019-02-07 DIAGNOSIS — Z87891 Personal history of nicotine dependence: Secondary | ICD-10-CM | POA: Diagnosis not present

## 2019-02-07 DIAGNOSIS — J449 Chronic obstructive pulmonary disease, unspecified: Secondary | ICD-10-CM | POA: Diagnosis not present

## 2019-02-07 DIAGNOSIS — Z803 Family history of malignant neoplasm of breast: Secondary | ICD-10-CM | POA: Diagnosis not present

## 2019-02-07 DIAGNOSIS — T50905A Adverse effect of unspecified drugs, medicaments and biological substances, initial encounter: Secondary | ICD-10-CM

## 2019-02-07 DIAGNOSIS — N189 Chronic kidney disease, unspecified: Secondary | ICD-10-CM

## 2019-02-07 DIAGNOSIS — E039 Hypothyroidism, unspecified: Secondary | ICD-10-CM

## 2019-02-07 DIAGNOSIS — N400 Enlarged prostate without lower urinary tract symptoms: Secondary | ICD-10-CM | POA: Diagnosis not present

## 2019-02-07 DIAGNOSIS — R339 Retention of urine, unspecified: Secondary | ICD-10-CM | POA: Diagnosis not present

## 2019-02-07 DIAGNOSIS — F329 Major depressive disorder, single episode, unspecified: Secondary | ICD-10-CM | POA: Diagnosis not present

## 2019-02-07 DIAGNOSIS — Z7952 Long term (current) use of systemic steroids: Secondary | ICD-10-CM

## 2019-02-07 DIAGNOSIS — E2740 Unspecified adrenocortical insufficiency: Secondary | ICD-10-CM | POA: Diagnosis not present

## 2019-02-07 DIAGNOSIS — R635 Abnormal weight gain: Secondary | ICD-10-CM

## 2019-02-07 HISTORY — DX: Chronic obstructive pulmonary disease, unspecified: J44.9

## 2019-02-07 HISTORY — DX: Hypothyroidism, unspecified: E03.9

## 2019-02-07 HISTORY — DX: Chronic kidney disease, unspecified: N18.9

## 2019-02-07 HISTORY — DX: Gastro-esophageal reflux disease without esophagitis: K21.9

## 2019-02-07 LAB — CBC WITH DIFFERENTIAL/PLATELET
Abs Immature Granulocytes: 0.07 K/uL (ref 0.00–0.07)
Basophils Absolute: 0.1 K/uL (ref 0.0–0.1)
Basophils Relative: 1 %
Eosinophils Absolute: 0.2 K/uL (ref 0.0–0.5)
Eosinophils Relative: 3 %
HCT: 40.5 % (ref 39.0–52.0)
Hemoglobin: 12.9 g/dL — ABNORMAL LOW (ref 13.0–17.0)
Immature Granulocytes: 1 %
Lymphocytes Relative: 18 %
Lymphs Abs: 1.3 K/uL (ref 0.7–4.0)
MCH: 29.2 pg (ref 26.0–34.0)
MCHC: 31.9 g/dL (ref 30.0–36.0)
MCV: 91.6 fL (ref 80.0–100.0)
Monocytes Absolute: 0.9 K/uL (ref 0.1–1.0)
Monocytes Relative: 12 %
Neutro Abs: 4.9 K/uL (ref 1.7–7.7)
Neutrophils Relative %: 65 %
Platelets: 171 K/uL (ref 150–400)
RBC: 4.42 MIL/uL (ref 4.22–5.81)
RDW: 14.9 % (ref 11.5–15.5)
WBC: 7.3 K/uL (ref 4.0–10.5)
nRBC: 0 % (ref 0.0–0.2)

## 2019-02-07 LAB — URINALYSIS, ROUTINE W REFLEX MICROSCOPIC
Bilirubin Urine: NEGATIVE
Glucose, UA: NEGATIVE mg/dL
Hgb urine dipstick: NEGATIVE
Ketones, ur: NEGATIVE mg/dL
Leukocytes,Ua: NEGATIVE
Nitrite: NEGATIVE
Protein, ur: NEGATIVE mg/dL
Specific Gravity, Urine: 1.014 (ref 1.005–1.030)
pH: 6 (ref 5.0–8.0)

## 2019-02-07 LAB — COMPREHENSIVE METABOLIC PANEL
ALT: 24 U/L (ref 0–44)
AST: 18 U/L (ref 15–41)
Albumin: 3.6 g/dL (ref 3.5–5.0)
Alkaline Phosphatase: 62 U/L (ref 38–126)
Anion gap: 8 (ref 5–15)
BUN: 25 mg/dL — ABNORMAL HIGH (ref 8–23)
CO2: 27 mmol/L (ref 22–32)
Calcium: 8.9 mg/dL (ref 8.9–10.3)
Chloride: 105 mmol/L (ref 98–111)
Creatinine, Ser: 1.35 mg/dL — ABNORMAL HIGH (ref 0.61–1.24)
GFR calc Af Amer: >60 mL/min (ref >60)
GFR calc non Af Amer: 52 mL/min — ABNORMAL LOW (ref >60)
Glucose, Bld: 107 mg/dL — ABNORMAL HIGH (ref 70–99)
Potassium: 4.2 mmol/L (ref 3.5–5.1)
Sodium: 140 mmol/L (ref 135–145)
Total Bilirubin: 0.5 mg/dL (ref 0.3–1.2)
Total Protein: 6.2 g/dL — ABNORMAL LOW (ref 6.5–8.1)

## 2019-02-07 LAB — TSH: TSH: 1.94 u[IU]/mL (ref 0.350–4.500)

## 2019-02-07 LAB — CULTURE, URINE COMPREHENSIVE

## 2019-02-07 NOTE — Progress Notes (Signed)
Hematology/Oncology Follow Up Note [facility] [phone] [phone]   Patient Care Team: [name], MD as PCP - General (Family Medicine) [name], [name], MD (General Surgery) [name], MD as Consulting Physician (Oncology) [name], RN as Registered Nurse  Reason for Visit:  Follow up for lung cancer, immunotherapy side effects, adrenal insufficiency.  HISTORY OF PRESENTING ILLNESS:  Marc Gray 73 y.o.  male who presents for treatment of Stage IIIB (cT2b, cN3, cM0)  Adenocarcinoma of lung.  # Patient is s/p [place] and taxol concurrent RT for treatment of Stage IIIB Lung cancer. He recieved additional lung boost RT Currently on Durvalumab maintenance.   # Colon CA was listed in his history, however reviewing his surgical pathology from colonoscopy on 04/15/2014, during which he had polyps removed and all are negative for cancer.patient is scheduled for colonoscopy screening this year.  # 5/1/ 2019 CT scan was discussed at tumor board and changes consistent with radiation changes  INTERVAL HISTORY Patient he presents for follow-up of fo stage IIIB adenocarcinoma of the lung. Patient reports doing well today. Patient was recently seen by urology for urinary retention And was recommended to have TURP.  He plans to have procedure done on 9/18 2020. Reports that chronic fatigue is his baseline. Survivorship appointments with Beckey Rutter on 02/06/2019 and he mentioned anxiety.  Counseling was given.  The patient was recommended to start Zoloft. Patient is on chronic steroid use for adrenal insufficiency. Appetite is good.  Continue to have weight gain.  Review of Systems  Constitutional: Negative for appetite change, chills, fatigue, fever and unexpected weight change.  HENT:   Negative for hearing loss and voice change.   Eyes: Negative for eye problems and icterus.  Respiratory: Negative for chest tightness,  cough and shortness of breath.   Cardiovascular: Negative for chest pain and leg swelling.  Gastrointestinal: Negative for abdominal distention, abdominal pain and blood in stool.  Endocrine: Negative for hot flashes.  Genitourinary: Negative for difficulty urinating, dysuria and frequency.   Musculoskeletal: Negative for arthralgias and back pain.  Skin: Negative for itching and rash.  Neurological: Negative for extremity weakness, light-headedness and numbness.  Hematological: Negative for adenopathy. Does not bruise/bleed easily.  Psychiatric/Behavioral: Negative for confusion.    MEDICAL HISTORY:  Past Medical History:  Diagnosis Date  . Anxiety   . Arthritis    SHOULDER  . Cataract   . Chronic kidney disease    low kidney function  . Colon cancer ([place]) 2015   Pt states during his colonoscopy he had cancerous polyps removed.   [name] COPD (chronic obstructive pulmonary disease) ([place])   . Depression    SINCE DIAGNOSIS  . Dyspnea    DOE  . Enlarged prostate   . GERD (gastroesophageal reflux disease)   . Hypertension   . Hypothyroidism   . Lung cancer [facility])    Aug 2018  . Pain    DUE TO LUNG ISSUE    SURGICAL HISTORY: Past Surgical History:  Procedure Laterality Date  . BACK SURGERY     kyphoplasty  T5-6  . CATARACT EXTRACTION W/ INTRAOCULAR LENS  IMPLANT, BILATERAL    . COLONOSCOPY    . EYE SURGERY Bilateral    cataract  . LYMPH GLAND EXCISION N/A 02/09/2017   Procedure: CERVICAL LYMPH NODE BIOPSY;  Surgeon: Robert Bellow, MD;  Location: [facility] ORS;  Service: General;  Laterality: N/A;  . PORTACATH PLACEMENT Right 02/23/2017   Procedure: INSERTION PORT-A-CATH;  Surgeon: Robert Bellow, MD;  Location: [facility] ORS;  Service: General;  Laterality: Right;  . PROSTATE SURGERY    . TONSILLECTOMY      SOCIAL HISTORY: Social History   Socioeconomic History  . Marital status: Married    Spouse name: Not on file  . Number of children: Not on file  . Years of  education: Not on file  . Highest education level: Not on file  Occupational History  . Not on file  Social Needs  . Financial resource strain: Not on file  . Food insecurity    Worry: Not on file    Inability: Not on file  . Transportation needs    Medical: Not on file    Non-medical: Not on file  Tobacco Use  . Smoking status: Former Smoker    Packs/day: 1.00    Years: 53.00    Pack years: 53.00    Types: Cigarettes    Quit date: 04/14/2017    Years since quitting: 1.8  . Smokeless tobacco: Never Used  . Tobacco comment: Quit November 2018  Substance and Sexual Activity  . Alcohol use: Yes    Comment: occassional  . Drug use: No  . Sexual activity: Yes  Lifestyle  . Physical activity    Days per week: Not on file    Minutes per session: Not on file  . Stress: Not on file  Relationships  . Social Herbalist on phone: Not on file    Gets together: Not on file    Attends religious service: Not on file    Active member of club or organization: Not on file    Attends meetings of clubs or organizations: Not on file    Relationship status: Not on file  . Intimate partner violence    Fear of current or ex partner: Not on file    Emotionally abused: Not on file    Physically abused: Not on file    Forced sexual activity: Not on file  Other Topics Concern  . Not on file  Social History Narrative  . Not on file    FAMILY HISTORY: Family History  Problem Relation Age of Onset  . Breast cancer Maternal Grandmother   . Dementia Mother   . Diabetes Father   . Stroke Father     ALLERGIES:  has No Known Allergies.  MEDICATIONS:  Current Outpatient Medications  Medication Sig Dispense Refill  . acetaminophen (TYLENOL) 500 MG tablet Take 1,000 mg by mouth every 6 (six) hours as needed for moderate pain.    . budesonide-formoterol (SYMBICORT) 160-4.5 MCG/ACT inhaler Inhale 2 puffs into the lungs 2 (two) times daily. 1 Inhaler 1  . calcium-vitamin D (OSCAL  WITH D) 500-200 MG-UNIT tablet Take 1 tablet by mouth daily. 90 tablet 1  . Chlorpheniramine-Phenylephrine (SINUS & ALLERGY) 4-10 MG tablet Take 1 tablet by mouth every 6 (six) hours as needed for allergies.     . fluticasone (FLONASE) 50 MCG/ACT nasal spray Place 1 spray into both nostrils daily. 16 g 2  . levothyroxine (SYNTHROID) 175 MCG tablet Take 1 tablet (175 mcg total) by mouth daily before breakfast. 30 tablet 3  . lidocaine-prilocaine (EMLA) cream Apply over port 1-2 hours prior to chemotherapy treatment.  Cover with plastic wrap. 30 g 1  . Melatonin 10 MG TABS Take 10 mg by mouth at bedtime.     . Multiple Vitamin (MULTI-VITAMINS) TABS Take 1 tablet by mouth daily.     [name]  ondansetron (ZOFRAN) 8 MG tablet Take 1 tablet (8 mg total) by mouth every 12 (twelve) hours as needed for nausea or vomiting. 60 tablet 0  . pantoprazole (PROTONIX) 40 MG tablet Take 1 tablet (40 mg total) by mouth daily. 90 tablet 1  . predniSONE (DELTASONE) 10 MG tablet Take one tablet each morning. Take one-half tablet each afternoon (take between 2 - 5 PM) (Patient taking differently: Take 5-10 mg by mouth See admin instructions. Take 10 mg by mouth in the morning and 5 mg in the late afternoon (between 2 - 5 PM)) 45 tablet 0  . PROAIR HFA 108 (90 Base) MCG/ACT inhaler INHALE 2 PUFFS BY MOUTH EVERY 6 HOURS AS NEEDED (Patient taking differently: Inhale 2 puffs into the lungs every 6 (six) hours as needed for wheezing or shortness of breath. ) 8.5 g 0  . prochlorperazine (COMPAZINE) 10 MG tablet Take 1 tablet (10 mg total) by mouth every 6 (six) hours as needed for nausea or vomiting. 60 tablet 2  . sertraline (ZOLOFT) 25 MG tablet Take 1 tablet (25 mg total) by mouth daily. (Patient taking differently: Take 25 mg by mouth daily. Has not gotten from pharmacy yet.) 30 tablet 0  . tadalafil (CIALIS) 5 MG tablet Take 1 tablet (5 mg total) by mouth daily as needed for erectile dysfunction. 30 tablet 11  . tamsulosin (FLOMAX)  0.4 MG CAPS capsule Take 1 capsule (0.4 mg total) by mouth daily. 90 capsule 1  . tiotropium (SPIRIVA HANDIHALER) 18 MCG inhalation capsule INHALE 1 CAPSULE DAILY (Patient taking differently: Place 18 mcg into inhaler and inhale daily. ) 30 capsule 0  . traMADol (ULTRAM) 50 MG tablet Take 1 tablet (50 mg total) by mouth every 6 (six) hours as needed for moderate pain or severe pain. 30 tablet 0  . triamcinolone ointment (KENALOG) 0.5 % Apply 1 application topically 3 (three) times daily as needed. 30 g 1  . aspirin 325 MG tablet Take 650 mg by mouth daily.     . diphenhydrAMINE-zinc acetate (BENADRYL EXTRA STRENGTH) cream Apply 1 application topically 3 (three) times daily as needed for itching. (Patient not taking: Reported on 02/07/2019) 28 g 0   No current facility-administered medications for this visit.    Facility-Administered Medications Ordered in Other Visits  Medication Dose Route Frequency Provider Last Rate Last Dose  . sodium chloride flush (NS) 0.9 % injection 10 mL  10 mL Intravenous PRN [name], MD   10 mL at 04/03/17 2334      .  PHYSICAL EXAMINATION: ECOG PERFORMANCE STATUS: 1 - Symptomatic but completely ambulatory Vitals:   02/07/19 1016  BP: (!) 157/79  Pulse: 73  Resp: 20  Temp: (!) 96.6 F (35.9 C)   Filed Weights   02/07/19 1016  Weight: 241 lb 11.2 oz (109.6 kg)  Physical Exam  Constitutional: He is oriented to person, place, and time. No distress.  Obese  HENT:  Head: Normocephalic and atraumatic.  Right Ear: External ear normal.  Left Ear: External ear normal.  Nose: Nose normal.  Mouth/Throat: Oropharynx is clear and moist. No oropharyngeal exudate.  Eyes: Pupils are equal, round, and reactive to light. EOM are normal. No scleral icterus.  Neck: Normal range of motion. Neck supple.  Cardiovascular: Normal rate, regular rhythm and normal heart sounds.  No murmur heard. Pulmonary/Chest: Effort normal. No respiratory distress. He has no wheezes. He  has no rales. He exhibits no tenderness.  Decreased sound bilaterally lower lobe.  Abdominal: Soft. Bowel sounds are normal. He exhibits no distension. There is no abdominal tenderness.  Musculoskeletal: Normal range of motion.        General: No tenderness or edema.  Lymphadenopathy:    He has no cervical adenopathy.  Neurological: He is alert and oriented to person, place, and time. No cranial nerve deficit. He exhibits normal muscle tone. Coordination normal.  Skin: Skin is warm and dry. No rash noted. He is not diaphoretic. No erythema.  Psychiatric: Memory, affect and judgment normal.   LABORATORY DATA:  I have reviewed the data as listed Lab Results  Component Value Date   WBC 7.3 02/07/2019   HGB 12.9 (L) 02/07/2019   HCT 40.5 02/07/2019   MCV 91.6 02/07/2019   PLT 171 02/07/2019   Recent Labs    07/22/18 1252 08/01/18 0832 11/05/18 1303 02/07/19 0952  NA 138  --  142 140  K 4.1  --  3.9 4.2  CL 102  --  108 105  CO2 27  --  25 27  GLUCOSE 96  --  109* 107*  BUN 33*  --  30* 25*  CREATININE 1.50* 1.40* 1.73* 1.35*  CALCIUM 8.9  --  8.6* 8.9  GFRNONAA 46*  --  39* 52*  GFRAA 54*  --  45* >60  PROT 6.9  --  6.6 6.2*  ALBUMIN 3.8  --  3.6 3.6  AST 20  --  20 18  ALT 26  --  24 24  ALKPHOS 67  --  63 62  BILITOT 0.6  --  0.5 0.5  RADIOGRAPHIC STUDIES: I have personally reviewed the radiological images as listed and agreed with the findings in the report. CT chest without contrast 11/04/2018  1 Post treatment scarring in the medial left hemithorax, stable. No evidence recurrent or metastatic disease. 2. Ascending Aortic aneurysm NOS (ICD10-I71.9). Recommend annual imaging followup by CTA or MRA. This recommendation follows 2010 ACCF/AHA/AATS/ACR/ASA/SCA/SCAI/SIR/STS/SVM Guidelines for the Diagnosis and Management of Patients with Thoracic Aortic Disease. Circulation. 2010; 121: D552-C802. Aortic aneurysm NOS (ICD10-I71.9). 3. Aortic atherosclerosis (ICD10-170.0).  Coronary artery calcification. 4. Centrilobular emphysema.  ASSESSMENT & PLAN:  Cancer Staging Adenocarcinoma of left lung, stage 3 (HCC) Staging form: Lung, AJCC 8th Edition - Clinical stage from 02/14/2017: Stage IIIB (cT2b, cN3, cM0) - Signed by [name], MD on 02/14/2017  1. Adenocarcinoma of left lung, stage 3 ([place])   2. Chronic kidney disease, unspecified CKD stage   3. Hypothyroidism, unspecified type   4. Weight gain due to medication   5. Adrenal insufficiency (HCC)    #Stage IIIB lung adenocarcinoma, s/p concurrent chemo/RT and immunotherapy.  Interim CT scan was independently reviewed and discussed with patient. Stable disease.  No evidence of metastatic disease or recurrence.  #Chronic steroid use for adrenal insufficiency. Continue follow-up with Dr. Gabriel Carina  #Hypothyroidism, TSH has been monitored.  Patient is on Synthroid 175 MCG daily.  TSH is at 1.94. Recommend patient to continue follow-up with Dr. Sharrie Rothman.  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 #CKD, avoid NSAIDs. Follow-up with urology for urinary distention/BPH...  He is going to have TURP procedure.  Repeat CT scan in 3 months and follow-up in the clinic. Orders Placed This Encounter  Procedures  . CT CHEST WO CONTRAST    Standing Status:   Future    Standing Expiration Date:   02/07/2020    Order Specific Question:   ** REASON FOR EXAM (FREE TEXT)  Answer:   hx lung CA    Order Specific Question:   Preferred imaging location?    Answer:   [facility]    Order Specific Question:   Radiology Contrast Protocol - do NOT remove file path    Answer:   [email or web address]  . CBC with Differential/Platelet    Standing Status:   Future    Standing Expiration Date:   02/07/2020  . Comprehensive metabolic panel    Standing Status:   Future    Standing Expiration Date:   02/07/2020  . TSH    Standing Status:   Future    Standing Expiration Date:   02/07/2020    Follow up in 3 months, repeat CT chest surveillance scan. We spent sufficient time to discuss many aspect of care, questions were answered to patient's satisfaction. Total face to face encounter time for this patient visit was 25 min. >50% of the time was  spent in counseling and coordination of care.   [name], MD, PhD  02/07/2019

## 2019-02-07 NOTE — Patient Instructions (Signed)
Your procedure is scheduled on: Friday 9/18 Report to Day Surgery. To find out your arrival time please call [phone] between 1PM - 3PM on Thurs 9/17.  Remember: Instructions that are not followed completely may result in serious medical risk,  up to and including death, or upon the discretion of your surgeon and anesthesiologist your  surgery may need to be rescheduled.     _X__ 1. Do not eat food after midnight the night before your procedure.                 No gum chewing or hard candies. You may drink clear liquids up to 2 hours                 before you are scheduled to arrive for your surgery- DO not drink clear                 liquids within 2 hours of the start of your surgery.                 Clear Liquids include:  water, apple juice without pulp, clear carbohydrate                 drink such as Clearfast of Gatorade, Black Coffee or Tea (Do not add                 anything to coffee or tea).  __X__2.  On the morning of surgery brush your teeth with toothpaste and water, you                may rinse your mouth with mouthwash if you wish.  Do not swallow any toothpaste of mouthwash.     _X__ 3.  No Alcohol for 24 hours before or after surgery.   __ 4.  Do Not Smoke or use e-cigarettes For 24 Hours Prior to Your Surgery.                 Do not use any chewable tobacco products for at least 6 hours prior to                 surgery.  ____  5.  Bring all medications with you on the day of surgery if instructed.   __x__  6.  Notify your doctor if there is any change in your medical condition      (cold, fever, infections).     Do not wear jewelry, make-up, hairpins, clips or nail polish. Do not wear lotions, powders, or perfumes. You may wear deodorant. Do not shave 48 hours prior to surgery. Men may shave face and neck. Do not bring valuables to the hospital.    [facility] is not responsible for any belongings or valuables.  Contacts, dentures  or bridgework may not be worn into surgery. Leave your suitcase in the car. After surgery it may be brought to your room. For patients admitted to the hospital, discharge time is determined by your treatment team.   Patients discharged the day of surgery will not be allowed to drive home.   Please read over the following fact sheets that you were given:   __x__ Take these medicines the morning of surgery with A SIP OF WATER:    1. predniSONE (DELTASONE) 10 MG tablet  2. levothyroxine (SYNTHROID) 175 MCG tablet  3. pantoprazole (PROTONIX) 40 MG tablet  Take dose the night before and one the morning of surgery  4.sertraline (ZOLOFT) 25 MG  tablet  5.tamsulosin (FLOMAX) 0.4 MG CAPS capsule  6.acetaminophen (TYLENOL) 500 MG tablet   If needed  ____ Fleet Enema (as directed)   ____ Use CHG Soap as directed  _x___ Use inhalers on the day of surgery budesonide-formoterol (SYMBICORT) 160-4.5 MCG/ACT inhaler,tiotropium (SPIRIVA HANDIHALER) 18 MCG inhalation capsule,                                  PROAIR HFA 108 (90 Base) MCG/ACT inhaler bring it with you.      ____ Stop metformin 2 days prior to surgery    ____ Take 1/2 of usual insulin dose the night before surgery. No insulin the morning          of surgery.   __x__ Stopped aspirin on 9/9  __x__ Don't  take Anti-inflammatories on     May take tylenol   ____ Stop supplements until after surgery.    ____ Bring C-Pap to the hospital.

## 2019-02-11 ENCOUNTER — Other Ambulatory Visit
Admission: RE | Admit: 2019-02-11 | Discharge: 2019-02-11 | Disposition: A | Discharge status: Home / Self Care | Payer: PPO | Source: Ambulatory Visit | Attending: Urology | Admitting: Urology

## 2019-02-11 ENCOUNTER — Other Ambulatory Visit: Payer: Self-pay

## 2019-02-11 DIAGNOSIS — Z01812 Encounter for preprocedural laboratory examination: Secondary | ICD-10-CM | POA: Diagnosis not present

## 2019-02-11 DIAGNOSIS — Z20828 Contact with and (suspected) exposure to other viral communicable diseases: Secondary | ICD-10-CM | POA: Diagnosis not present

## 2019-02-11 LAB — SARS CORONAVIRUS 2 (TAT 6-24 HRS): SARS Coronavirus 2: NEGATIVE

## 2019-02-12 DIAGNOSIS — R030 Elevated blood-pressure reading, without diagnosis of hypertension: Secondary | ICD-10-CM | POA: Diagnosis not present

## 2019-02-12 DIAGNOSIS — H6122 Impacted cerumen, left ear: Secondary | ICD-10-CM | POA: Diagnosis not present

## 2019-02-12 DIAGNOSIS — R0602 Shortness of breath: Secondary | ICD-10-CM | POA: Diagnosis not present

## 2019-02-12 DIAGNOSIS — Z0000 Encounter for general adult medical examination without abnormal findings: Secondary | ICD-10-CM | POA: Diagnosis not present

## 2019-02-12 DIAGNOSIS — E039 Hypothyroidism, unspecified: Secondary | ICD-10-CM | POA: Diagnosis not present

## 2019-02-12 DIAGNOSIS — F419 Anxiety disorder, unspecified: Secondary | ICD-10-CM | POA: Diagnosis not present

## 2019-02-12 IMAGING — CT CT CHEST W/O CM
2 of 4 series · 15 of 36 positions shown, 18 images · non-contrast
Comparison: 09/26/2017.

CLINICAL DATA: Small cell lung cancer, chemotherapy and radiation
therapy. Occasional shortness of breath and cough.

EXAM:
CT CHEST WITHOUT CONTRAST
TECHNIQUE: Multidetector CT imaging of the chest was performed following the
standard protocol without IV contrast.

[Series 2: chest · axial · 0.70mm/px · z∈[-1204,-914]mm · 12 of 173 slices shown, 15 images (1 of 2)]
[im 14/173  mediastinal]
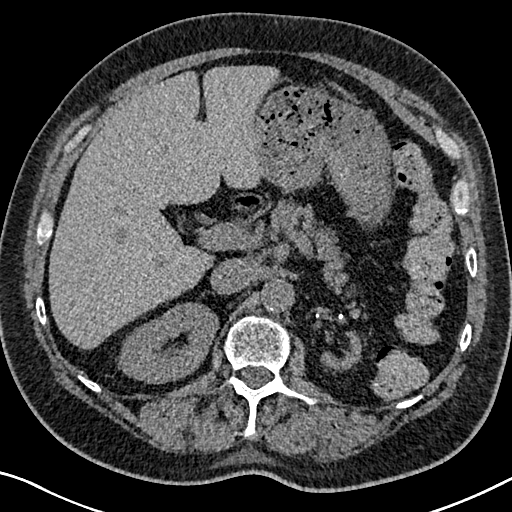
[im 14/173  lung]
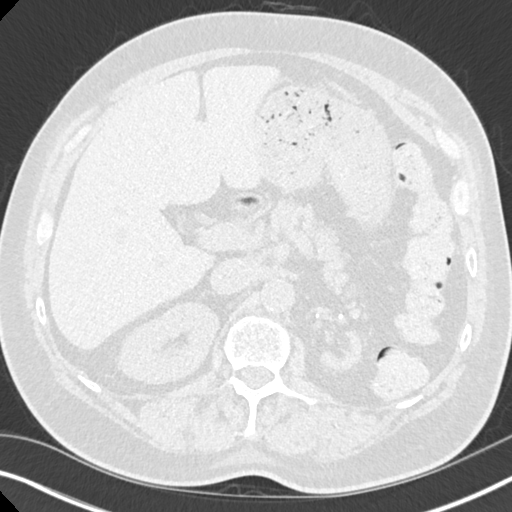
[im 27/173  lung]
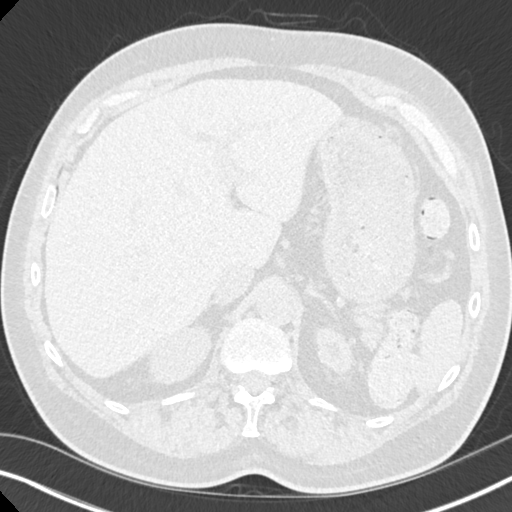
[im 40/173  lung]
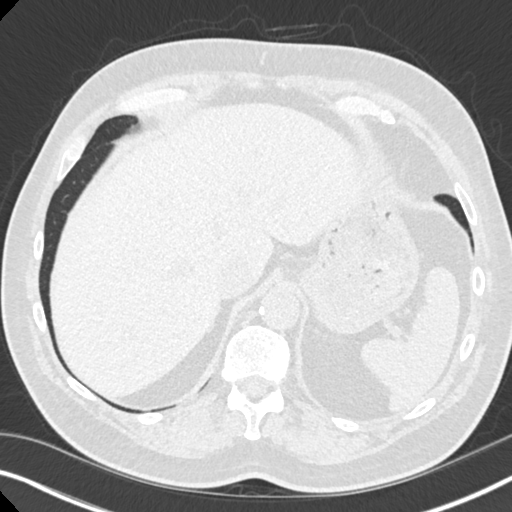
[im 53/173  lung]
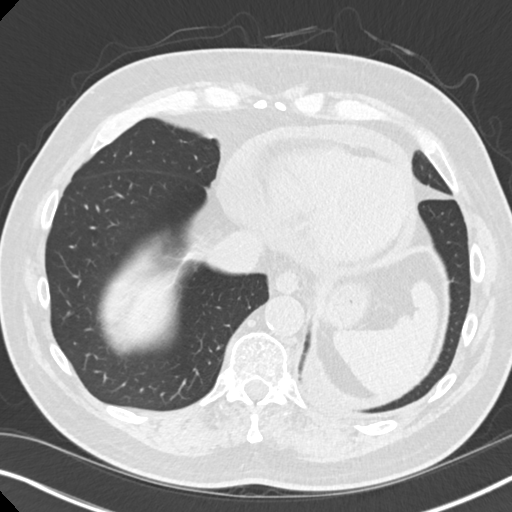
[im 67/173  mediastinal]
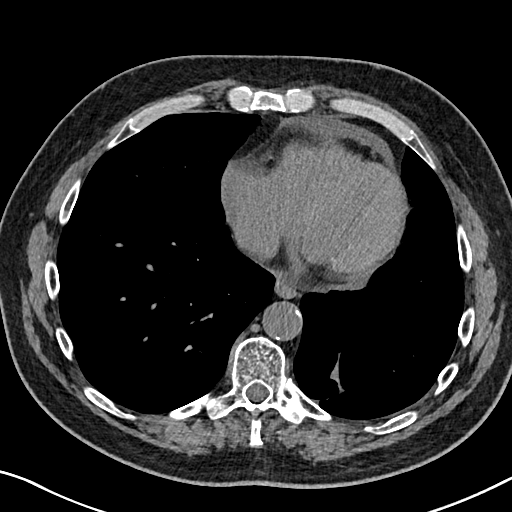
[im 67/173  lung]
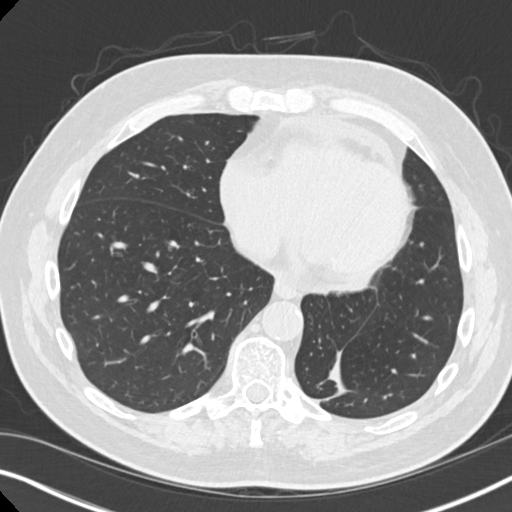
[im 80/173  lung]
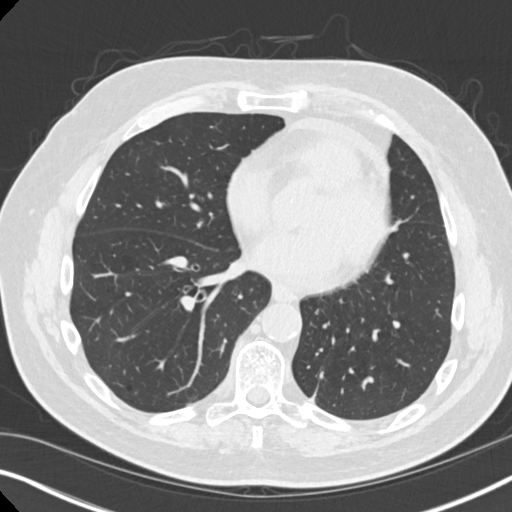
[im 93/173  lung]
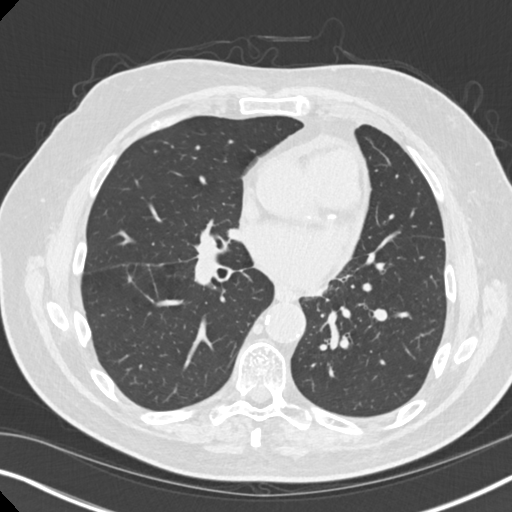
[im 106/173  lung]
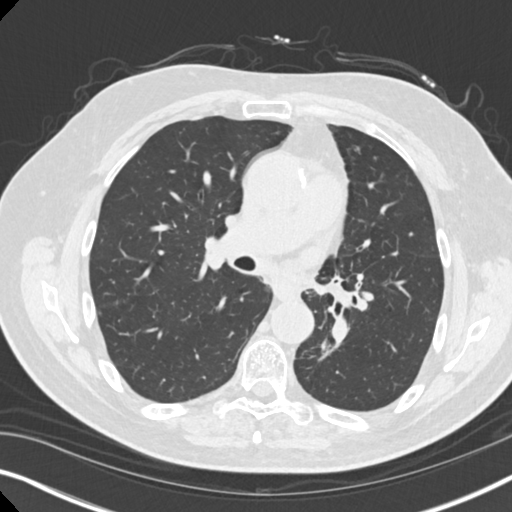
[im 120/173  mediastinal]
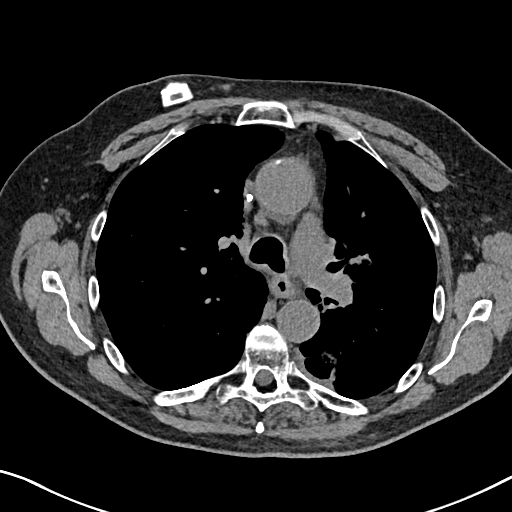
[im 120/173  lung]
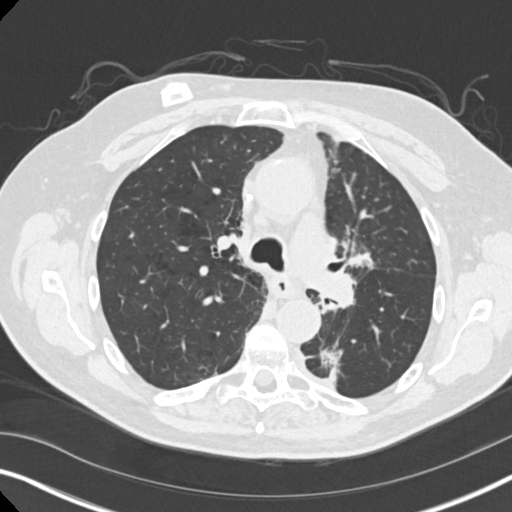
[im 133/173  lung]
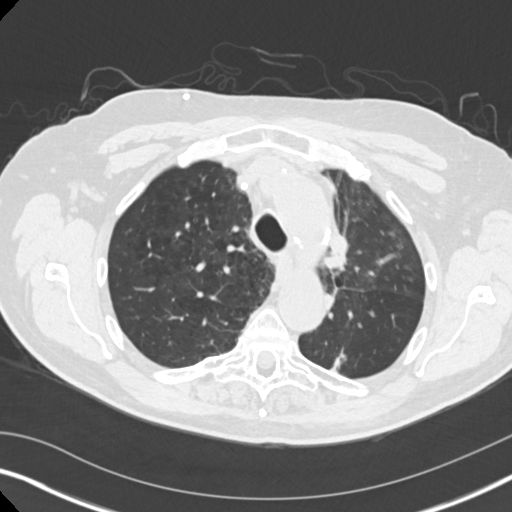
[im 146/173  lung]
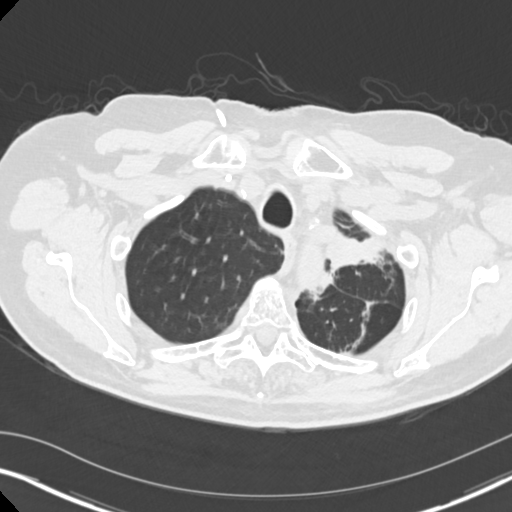
[im 159/173  lung]
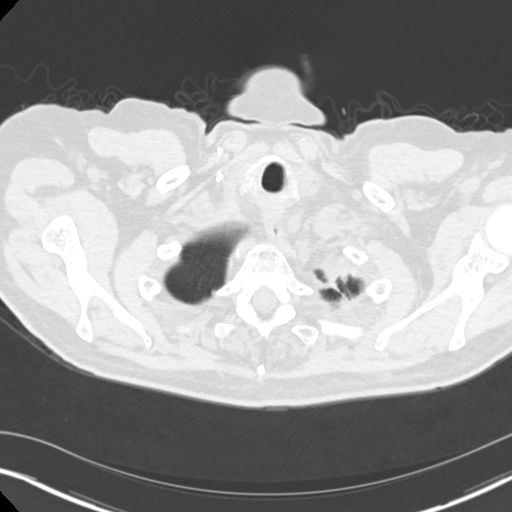

[Series 5: chest · coronal · 0.68mm/px · 3 of 178 slices shown (2 of 2)]
[im 36/178  lung]
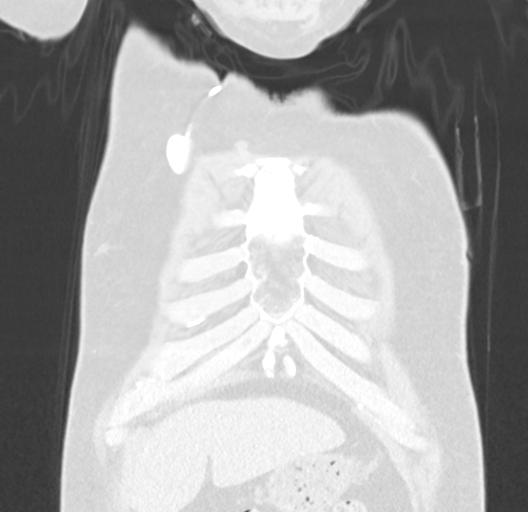
[im 71/178  lung]
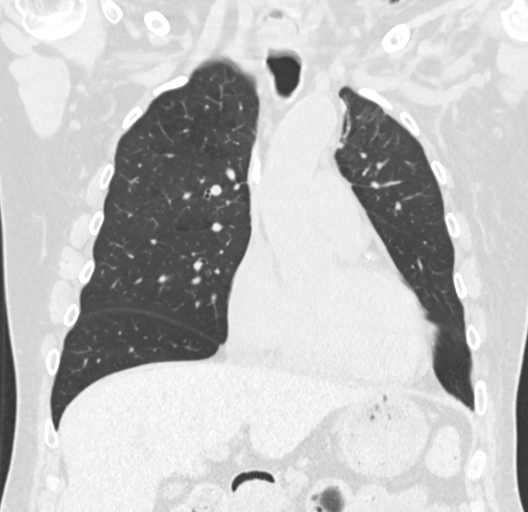
[im 107/178  lung]
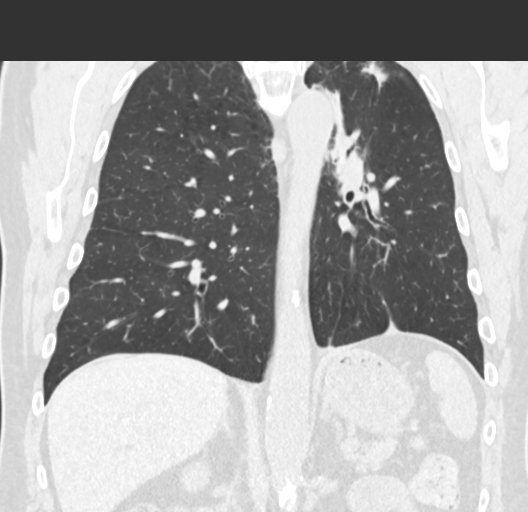

[15 of 36 positions shown; findings below may reference images not displayed]

FINDINGS: Cardiovascular: Right IJ Port-A-Cath terminates in the SVC.
Atherosclerotic calcification of the arterial vasculature, including
three-vessel involvement of the coronary arteries. Ascending aorta
measures 4.2 cm, as before. Heart size within normal limits. Small
pericardial effusion, increased.

Mediastinum/Nodes: No pathologically enlarged mediastinal or
axillary lymph nodes. Hilar regions are difficult to evaluate
without IV contrast. Esophagus is grossly unremarkable.

Lungs/Pleura: Somewhat masslike collapse/consolidation in the left
upper lobe is again seen with surrounding architectural distortion,
bronchiectasis and volume loss, similar to 09/26/2017. Image quality
is degraded by respiratory motion. Ground-glass and irregular
consolidation in the medial aspect of upper/mid left hemithorax is
slightly more organized in appearance and presumably treatment
related. Moderate centrilobular emphysema. Subpleural nodule in the
apical segment right upper lobe measures 7 mm (series 3, image 34),
unchanged. No pleural fluid.

Upper Abdomen: Visualized portions of the liver, gallbladder,
adrenal glands and right kidney are unremarkable. Left kidney is
atrophic. Visualized portions of the spleen, pancreas, stomach and
bowel are grossly unremarkable. No upper abdominal adenopathy.

Musculoskeletal: T2 superior endplate compression deformity is new.
IMPRESSION: 1. Stable masslike consolidation in the left upper lobe with
evolving radiation therapy changes in the medial left hemithorax.
2. Subpleural right upper lobe nodule is unchanged.
3. Small pericardial effusion, slightly increased from prior.
4. New T2 superior endplate compression fracture.
5. Aortic atherosclerosis (IVNVQ-170.0). Three-vessel coronary
artery calcification.
6. Ascending Aortic aneurysm NOS (IVNVQ-VPN.8). Recommend annual
imaging followup by CTA or MRA. This recommendation follows 7090
ACCF/AHA/AATS/ACR/ASA/SCA/X-SI/EBADAT/MARKUS/JELKS Guidelines for the
Diagnosis and Management of Patients with Thoracic Aortic Disease.
Circulation. 7090; 121: e266-e369.
7.  Emphysema (IVNVQ-96J.Z).

## 2019-02-13 ENCOUNTER — Encounter: Payer: Self-pay | Admitting: Anesthesiology

## 2019-02-13 MED ORDER — CIPROFLOXACIN IN D5W 400 MG/200ML IV SOLN
400.0000 mg | Freq: Once | INTRAVENOUS | Status: AC
  Administered 2019-02-14: 400 mg via INTRAVENOUS

## 2019-02-14 ENCOUNTER — Encounter
Admission: RE | Disposition: A | Discharge status: Home / Self Care | Payer: Self-pay | Source: Home / Self Care | Attending: Urology

## 2019-02-14 ENCOUNTER — Other Ambulatory Visit: Payer: Self-pay

## 2019-02-14 ENCOUNTER — Ambulatory Visit: Payer: PPO | Admitting: Anesthesiology

## 2019-02-14 ENCOUNTER — Ambulatory Visit
Admission: RE | Admit: 2019-02-14 | Discharge: 2019-02-14 | Disposition: A | Discharge status: Home / Self Care | Payer: PPO | Attending: Urology | Admitting: Urology

## 2019-02-14 ENCOUNTER — Telehealth: Payer: Self-pay | Admitting: Urology

## 2019-02-14 DIAGNOSIS — Z87891 Personal history of nicotine dependence: Secondary | ICD-10-CM | POA: Insufficient documentation

## 2019-02-14 DIAGNOSIS — C61 Malignant neoplasm of prostate: Secondary | ICD-10-CM | POA: Diagnosis not present

## 2019-02-14 DIAGNOSIS — Z823 Family history of stroke: Secondary | ICD-10-CM | POA: Insufficient documentation

## 2019-02-14 DIAGNOSIS — M1990 Unspecified osteoarthritis, unspecified site: Secondary | ICD-10-CM | POA: Diagnosis not present

## 2019-02-14 DIAGNOSIS — Z803 Family history of malignant neoplasm of breast: Secondary | ICD-10-CM | POA: Insufficient documentation

## 2019-02-14 DIAGNOSIS — Z833 Family history of diabetes mellitus: Secondary | ICD-10-CM | POA: Insufficient documentation

## 2019-02-14 DIAGNOSIS — Z86010 Personal history of colonic polyps: Secondary | ICD-10-CM | POA: Insufficient documentation

## 2019-02-14 DIAGNOSIS — J449 Chronic obstructive pulmonary disease, unspecified: Secondary | ICD-10-CM | POA: Diagnosis not present

## 2019-02-14 DIAGNOSIS — K219 Gastro-esophageal reflux disease without esophagitis: Secondary | ICD-10-CM | POA: Insufficient documentation

## 2019-02-14 DIAGNOSIS — F329 Major depressive disorder, single episode, unspecified: Secondary | ICD-10-CM | POA: Insufficient documentation

## 2019-02-14 DIAGNOSIS — I129 Hypertensive chronic kidney disease with stage 1 through stage 4 chronic kidney disease, or unspecified chronic kidney disease: Secondary | ICD-10-CM | POA: Insufficient documentation

## 2019-02-14 DIAGNOSIS — R3914 Feeling of incomplete bladder emptying: Secondary | ICD-10-CM | POA: Insufficient documentation

## 2019-02-14 DIAGNOSIS — N320 Bladder-neck obstruction: Secondary | ICD-10-CM | POA: Insufficient documentation

## 2019-02-14 DIAGNOSIS — N401 Enlarged prostate with lower urinary tract symptoms: Secondary | ICD-10-CM | POA: Diagnosis not present

## 2019-02-14 DIAGNOSIS — Z9842 Cataract extraction status, left eye: Secondary | ICD-10-CM | POA: Diagnosis not present

## 2019-02-14 DIAGNOSIS — N189 Chronic kidney disease, unspecified: Secondary | ICD-10-CM | POA: Insufficient documentation

## 2019-02-14 DIAGNOSIS — Z9841 Cataract extraction status, right eye: Secondary | ICD-10-CM | POA: Insufficient documentation

## 2019-02-14 DIAGNOSIS — E039 Hypothyroidism, unspecified: Secondary | ICD-10-CM | POA: Insufficient documentation

## 2019-02-14 DIAGNOSIS — Z820 Family history of epilepsy and other diseases of the nervous system: Secondary | ICD-10-CM | POA: Insufficient documentation

## 2019-02-14 DIAGNOSIS — N138 Other obstructive and reflux uropathy: Secondary | ICD-10-CM | POA: Insufficient documentation

## 2019-02-14 DIAGNOSIS — Z85118 Personal history of other malignant neoplasm of bronchus and lung: Secondary | ICD-10-CM | POA: Insufficient documentation

## 2019-02-14 DIAGNOSIS — F419 Anxiety disorder, unspecified: Secondary | ICD-10-CM | POA: Insufficient documentation

## 2019-02-14 DIAGNOSIS — Z85038 Personal history of other malignant neoplasm of large intestine: Secondary | ICD-10-CM | POA: Insufficient documentation

## 2019-02-14 HISTORY — PX: TRANSURETHRAL RESECTION OF PROSTATE: SHX73

## 2019-02-14 SURGERY — TURP (TRANSURETHRAL RESECTION OF PROSTATE)
Anesthesia: General

## 2019-02-14 MED ORDER — PHENYLEPHRINE HCL (PRESSORS) 10 MG/ML IV SOLN
INTRAVENOUS | Status: DC | PRN
  Administered 2019-02-14: 100 ug via INTRAVENOUS
  Administered 2019-02-14 (×2): 200 ug via INTRAVENOUS

## 2019-02-14 MED ORDER — EPHEDRINE SULFATE 50 MG/ML IJ SOLN
INTRAMUSCULAR | Status: AC
  Filled 2019-02-14: qty 1

## 2019-02-14 MED ORDER — EPHEDRINE SULFATE 50 MG/ML IJ SOLN
INTRAMUSCULAR | Status: DC | PRN
  Administered 2019-02-14: 20 mg via INTRAVENOUS
  Administered 2019-02-14: 10 mg via INTRAVENOUS

## 2019-02-14 MED ORDER — LIDOCAINE HCL (PF) 2 % IJ SOLN
INTRAMUSCULAR | Status: AC
  Filled 2019-02-14: qty 10

## 2019-02-14 MED ORDER — DEXAMETHASONE SODIUM PHOSPHATE 10 MG/ML IJ SOLN
INTRAMUSCULAR | Status: DC | PRN
  Administered 2019-02-14: 10 mg via INTRAVENOUS

## 2019-02-14 MED ORDER — FENTANYL CITRATE (PF) 100 MCG/2ML IJ SOLN
INTRAMUSCULAR | Status: DC | PRN
  Administered 2019-02-14 (×2): 50 ug via INTRAVENOUS

## 2019-02-14 MED ORDER — BELLADONNA ALKALOIDS-OPIUM 16.2-60 MG RE SUPP
RECTAL | Status: DC | PRN
  Administered 2019-02-14: 1 via RECTAL

## 2019-02-14 MED ORDER — PROPOFOL 10 MG/ML IV BOLUS
INTRAVENOUS | Status: AC
  Filled 2019-02-14: qty 20

## 2019-02-14 MED ORDER — MIDAZOLAM HCL 2 MG/2ML IJ SOLN
INTRAMUSCULAR | Status: AC
  Filled 2019-02-14: qty 2

## 2019-02-14 MED ORDER — LACTATED RINGERS IV SOLN
INTRAVENOUS | Status: DC
  Administered 2019-02-14: 07:00:00 via INTRAVENOUS

## 2019-02-14 MED ORDER — ROCURONIUM BROMIDE 100 MG/10ML IV SOLN
INTRAVENOUS | Status: DC | PRN
  Administered 2019-02-14: 50 mg via INTRAVENOUS
  Administered 2019-02-14: 10 mg via INTRAVENOUS

## 2019-02-14 MED ORDER — FENTANYL CITRATE (PF) 100 MCG/2ML IJ SOLN
INTRAMUSCULAR | Status: AC
  Filled 2019-02-14: qty 2

## 2019-02-14 MED ORDER — DEXAMETHASONE SODIUM PHOSPHATE 10 MG/ML IJ SOLN
INTRAMUSCULAR | Status: AC
  Filled 2019-02-14: qty 1

## 2019-02-14 MED ORDER — BELLADONNA ALKALOIDS-OPIUM 16.2-60 MG RE SUPP
RECTAL | Status: AC
  Filled 2019-02-14: qty 1

## 2019-02-14 MED ORDER — LIDOCAINE HCL 4 % MT SOLN
OROMUCOSAL | Status: DC | PRN
  Administered 2019-02-14: 4 mL via TOPICAL

## 2019-02-14 MED ORDER — SUGAMMADEX SODIUM 200 MG/2ML IV SOLN
INTRAVENOUS | Status: DC | PRN
  Administered 2019-02-14: 450 mg via INTRAVENOUS

## 2019-02-14 MED ORDER — VASOPRESSIN 20 UNIT/ML IV SOLN
INTRAVENOUS | Status: DC | PRN
  Administered 2019-02-14: 1 [IU] via INTRAVENOUS

## 2019-02-14 MED ORDER — ONDANSETRON HCL 4 MG/2ML IJ SOLN
INTRAMUSCULAR | Status: DC | PRN
  Administered 2019-02-14: 4 mg via INTRAVENOUS

## 2019-02-14 MED ORDER — ONDANSETRON HCL 4 MG/2ML IJ SOLN
INTRAMUSCULAR | Status: AC
  Filled 2019-02-14: qty 2

## 2019-02-14 MED ORDER — SUGAMMADEX SODIUM 200 MG/2ML IV SOLN
INTRAVENOUS | Status: AC
  Filled 2019-02-14: qty 2

## 2019-02-14 MED ORDER — PHENYLEPHRINE HCL (PRESSORS) 10 MG/ML IV SOLN
INTRAVENOUS | Status: AC
  Filled 2019-02-14: qty 1

## 2019-02-14 MED ORDER — ACETAMINOPHEN 10 MG/ML IV SOLN
INTRAVENOUS | Status: AC
  Filled 2019-02-14: qty 100

## 2019-02-14 MED ORDER — MIDAZOLAM HCL 2 MG/2ML IJ SOLN
INTRAMUSCULAR | Status: DC | PRN
  Administered 2019-02-14: 2 mg via INTRAVENOUS

## 2019-02-14 MED ORDER — FENTANYL CITRATE (PF) 100 MCG/2ML IJ SOLN: 25.0000 ug | INTRAMUSCULAR | Status: DC | PRN

## 2019-02-14 MED ORDER — HYDROCODONE-ACETAMINOPHEN 5-325 MG PO TABS: 1.0000 | ORAL_TABLET | ORAL | 0 refills | Status: AC | PRN

## 2019-02-14 MED ORDER — ROCURONIUM BROMIDE 50 MG/5ML IV SOLN
INTRAVENOUS | Status: AC
  Filled 2019-02-14: qty 1

## 2019-02-14 MED ORDER — ACETAMINOPHEN 10 MG/ML IV SOLN
INTRAVENOUS | Status: DC | PRN
  Administered 2019-02-14: 1000 mg via INTRAVENOUS

## 2019-02-14 MED ORDER — PROPOFOL 10 MG/ML IV BOLUS
INTRAVENOUS | Status: DC | PRN
  Administered 2019-02-14: 130 mg via INTRAVENOUS

## 2019-02-14 MED ORDER — LIDOCAINE HCL (CARDIAC) PF 100 MG/5ML IV SOSY
PREFILLED_SYRINGE | INTRAVENOUS | Status: DC | PRN
  Administered 2019-02-14: 100 mg via INTRAVENOUS

## 2019-02-14 MED ORDER — SULFAMETHOXAZOLE-TRIMETHOPRIM 800-160 MG PO TABS: 1.0000 | ORAL_TABLET | Freq: Every day | ORAL | 0 refills | Status: DC

## 2019-02-14 MED ORDER — ONDANSETRON HCL 4 MG/2ML IJ SOLN: 4.0000 mg | Freq: Once | INTRAMUSCULAR | Status: DC | PRN

## 2019-02-14 MED ORDER — CIPROFLOXACIN IN D5W 400 MG/200ML IV SOLN
INTRAVENOUS | Status: AC
  Filled 2019-02-14: qty 200

## 2019-02-14 SURGICAL SUPPLY — 27 items
ADAPTER IRRIG TUBE 2 SPIKE SOL (ADAPTER) ×4 IMPLANT
BAG DRAIN CYSTO-URO LG1000N (MISCELLANEOUS) ×2 IMPLANT
BAG URINE DRAINAGE (UROLOGICAL SUPPLIES) ×2 IMPLANT
BAG URO DRAIN 4000ML (MISCELLANEOUS) ×2 IMPLANT
CATH FOL 2WAY LX 22X30 (CATHETERS) ×2 IMPLANT
CATH FOLEY 3WAY 30CC 22FR (CATHETERS) IMPLANT
DRAPE UTILITY 15X26 TOWEL STRL (DRAPES) ×2 IMPLANT
DRSG TELFA 4X3 1S NADH ST (GAUZE/BANDAGES/DRESSINGS) ×2 IMPLANT
ELECT BIVAP BIPO 22/24 DONUT (ELECTROSURGICAL)
ELECT LOOP 22F BIPOLAR SML (ELECTROSURGICAL)
ELECTRD BIVAP BIPO 22/24 DONUT (ELECTROSURGICAL) IMPLANT
ELECTRODE LOOP 22F BIPOLAR SML (ELECTROSURGICAL) IMPLANT
GLOVE BIOGEL PI IND STRL 7.5 (GLOVE) ×1 IMPLANT
GLOVE BIOGEL PI INDICATOR 7.5 (GLOVE) ×1
GOWN STRL REUS W/ TWL LRG LVL3 (GOWN DISPOSABLE) ×1 IMPLANT
GOWN STRL REUS W/ TWL XL LVL3 (GOWN DISPOSABLE) ×1 IMPLANT
GOWN STRL REUS W/TWL LRG LVL3 (GOWN DISPOSABLE) ×1
GOWN STRL REUS W/TWL XL LVL3 (GOWN DISPOSABLE) ×1
HOLDER FOLEY CATH W/STRAP (MISCELLANEOUS) ×2 IMPLANT
KIT TURNOVER CYSTO (KITS) ×2 IMPLANT
LOOP CUT BIPOLAR 24F LRG (ELECTROSURGICAL) ×2 IMPLANT
PACK CYSTO AR (MISCELLANEOUS) ×2 IMPLANT
SET IRRIG Y TYPE TUR BLADDER L (SET/KITS/TRAYS/PACK) ×2 IMPLANT
SOL .9 NS 3000ML IRR  AL (IV SOLUTION) ×6
SOL .9 NS 3000ML IRR UROMATIC (IV SOLUTION) ×6 IMPLANT
SYRINGE IRR TOOMEY STRL 70CC (SYRINGE) ×2 IMPLANT
WATER STERILE IRR 1000ML POUR (IV SOLUTION) ×2 IMPLANT

## 2019-02-14 NOTE — Telephone Encounter (Signed)
-----   Message from [name], MD sent at 02/14/2019  9:09 AM EDT ----- Regarding: follow up Keep follow up Monday for void trial with [facility], and schedule 6 week follow up with me with IPSS/PVR.  Nickolas Madrid, MD 02/14/2019

## 2019-02-14 NOTE — Anesthesia Procedure Notes (Signed)
Procedure Name: Intubation Date/Time: 02/14/2019 7:38 AM Performed by: Eben Burow, CRNA Pre-anesthesia Checklist: Patient identified, Emergency Drugs available, Suction available and Patient being monitored Patient Re-evaluated:Patient Re-evaluated prior to induction Oxygen Delivery Method: Circle system utilized Preoxygenation: Pre-oxygenation with 100% oxygen Induction Type: IV induction Ventilation: Mask ventilation without difficulty Laryngoscope Size: McGraph and 4 Grade View: Grade I Tube type: Oral Tube size: 8.0 mm Number of attempts: 1 Airway Equipment and Method: Stylet,  Oral airway,  Video-laryngoscopy and LTA kit utilized Placement Confirmation: positive ETCO2 and breath sounds checked- equal and bilateral Secured at: 22 cm Tube secured with: Tape Dental Injury: Teeth and Oropharynx as per pre-operative assessment

## 2019-02-14 NOTE — Telephone Encounter (Signed)
App made ° ° °Marc Gray  °

## 2019-02-14 NOTE — H&P (Signed)
02/14/19 7:30 AM   Marc Gray 11-Aug-1945 [id number]   HPI: To briefly summarize he is a 73 year old male with history of urinary retention with bilateral hydroureteronephrosis in 2013 that was treated with TURP by Dr. Jacqlyn Larsen with significant improvement in his urinary symptoms, and resolution of his hydronephrosis.  He was found to have significantly elevated PVRs of greater than 700 cc recently, and has undergone thorough urology work-up.  Cystoscopy showed some residual obstructive anterior prostate tissue, and a massively distended bladder with trabeculations.  Urodynamics showed a functional bladder with normal desire at 449 mL, strong urge at 777 ml, max capacity of 1200 cc, high pressure voiding of 62 cm of water, average flow of 6 mL's per second, PVR of 820 mL's, and confirmed bladder outlet obstruction.  PVRs in clinic remains elevated 570 cc.  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  Renal function has declined over the last year from creatinine of 1.38 in January 2020 with a EGFR 51, steadily to creatinine 1.73, EGFR 45 in June 2020.  PSA was normal on 08/2018 at 1.44.     PMH: Past Medical History:  Diagnosis Date  . Anxiety   . Arthritis    SHOULDER  . Cataract   . Chronic kidney disease    low kidney function  . Colon cancer ([place]) 2015   Pt states during his colonoscopy he had cancerous polyps removed.   [name] COPD (chronic obstructive pulmonary disease) ([place])   . Depression    SINCE DIAGNOSIS  . Dyspnea    DOE  . Enlarged prostate   . GERD (gastroesophageal reflux disease)   . Hypertension   . Hypothyroidism   . Lung cancer [facility])    Aug 2018  . Pain    DUE TO LUNG ISSUE    Surgical History: Past Surgical History:  Procedure Laterality Date  . BACK SURGERY     kyphoplasty  T5-6  . CATARACT EXTRACTION W/ INTRAOCULAR LENS  IMPLANT, BILATERAL    . COLONOSCOPY    . EYE SURGERY Bilateral    cataract  . LYMPH  GLAND EXCISION N/A 02/09/2017   Procedure: CERVICAL LYMPH NODE BIOPSY;  Surgeon: Robert Bellow, MD;  Location: [facility];  Service: General;  Laterality: N/A;  . PORTACATH PLACEMENT Right 02/23/2017   Procedure: INSERTION PORT-A-CATH;  Surgeon: Robert Bellow, MD;  Location: [facility];  Service: General;  Laterality: Right;  . PROSTATE SURGERY    . TONSILLECTOMY      Allergies: No Known Allergies  Family History: Family History  Problem Relation Age of Onset  . Breast cancer Maternal Grandmother   . Dementia Mother   . Diabetes Father   . Stroke Father     Social History:  reports that he quit smoking about 22 months ago. His smoking use included cigarettes. He has a 53.00 pack-year smoking history. He has never used smokeless tobacco. He reports current alcohol use. He reports that he does not use drugs.  ROS: Please see flowsheet from today's date for complete review of systems.  Physical Exam: BP (!) 159/100   Pulse 68   Temp 98.1 F (36.7 C) (Tympanic)   Resp 16   Ht 6' 1"  (1.854 m)   Wt 108.7 kg   SpO2 97%   BMI 31.62 kg/m    Constitutional:  Alert and oriented, No acute distress. Cardiovascular: Regular rate and rhythm Respiratory: Clear to auscultation bilaterally GI: Abdomen is soft, nontender, nondistended, no  abdominal masses Lymph: No cervical or inguinal lymphadenopathy. Skin: No rashes, bruises or suspicious lesions. Neurologic: Grossly intact, no focal deficits, moving all 4 extremities. Psychiatric: Normal mood and affect.  Laboratory Data: Urine culture 02/05/2019 no growth  Assessment & Plan:   73 year old male with distant history of TURP for urinary retention with bilateral hydronephrosis with worsening urinary symptoms, prostatic regrowth causing obstruction, bladder outlet obstruction on urodynamics, and elevated PVRs greater than 500 mL.   We had a long conversation about treatment options including observation with potential for renal  damage or UTIs in the future, clean intermittent catheterization, or TURP.  We discussed the risks and benefits of all these options at length.  I strongly recommended TURP in the setting of his significantly elevated PVRs, and history of retention with bilateral hydroureteronephrosis and renal failure.  We discussed the risks and benefits of surgery including bleeding, infection, ongoing incomplete emptying, incontinence, need for temporary Foley catheter, and possible 1 night hospital stay.  Bipolar TURP today  [name], MD  [facility] [address], [place], [place] [address] [phone]

## 2019-02-14 NOTE — Discharge Instructions (Signed)
Indwelling Urinary Catheter Care, Adult An indwelling urinary catheter is a thin tube that is put into your bladder. The tube helps to drain pee (urine) out of your body. The tube goes in through your urethra. Your urethra is where pee comes out of your body. Your pee will come out through the catheter, then it will go into a bag (drainage bag). Take good care of your catheter so it will work well. How to wear your catheter and bag Supplies needed  Sticky tape (adhesive tape) or a leg strap.  Alcohol wipe or soap and water (if you use tape).  A clean towel (if you use tape).  Large overnight bag.  Smaller bag (leg bag). Wearing your catheter Attach your catheter to your leg with tape or a leg strap.  Make sure the catheter is not pulled tight.  If a leg strap gets wet, take it off and put on a dry strap.  If you use tape to hold the bag on your leg: 1. Use an alcohol wipe or soap and water to wash your skin where the tape made it sticky before. 2. Use a clean towel to pat-dry that skin. 3. Use new tape to make the bag stay on your leg. Wearing your bags You should have been given a large overnight bag.  You may wear the overnight bag in the day or night.  Always have the overnight bag lower than your bladder.  Do not let the bag touch the floor.  Before you go to sleep, put a clean plastic bag in a wastebasket. Then hang the overnight bag inside the wastebasket. You should also have a smaller leg bag that fits under your clothes.  Always wear the leg bag below your knee.  Do not wear your leg bag at night. How to care for your skin and catheter Supplies needed  A clean washcloth.  Water and mild soap.  A clean towel. Caring for your skin and catheter      Clean the skin around your catheter every day: ? Wash your hands with soap and water. ? Wet a clean washcloth in warm water and mild soap. ? Clean the skin around your urethra. ? If you are male: ? Gently  spread the folds of skin around your vagina (labia). ? With the washcloth in your other hand, wipe the inner side of your labia on each side. Wipe from front to back. ? If you are male: ? Pull back any skin that covers the end of your penis (foreskin). ? With the washcloth in your other hand, wipe your penis in small circles. Start wiping at the tip of your penis, then move away from the catheter. ? Move the foreskin back in place, if needed. ? With your free hand, hold the catheter close to where it goes into your body. ? Keep holding the catheter during cleaning so it does not get pulled out. ? With the washcloth in your other hand, clean the catheter. ? Only wipe downward on the catheter. ? Do not wipe upward toward your body. Doing this may push germs into your urethra and cause infection. ? Use a clean towel to pat-dry the catheter and the skin around it. Make sure to wipe off all soap. ? Wash your hands with soap and water.  Shower every day. Do not take baths.  Do not use cream, ointment, or lotion on the area where the catheter goes into your body, unless your doctor tells you  to.  Do not use powders, sprays, or lotions on your genital area.  Check your skin around the catheter every day for signs of infection. Check for: ? Redness, swelling, or pain. ? Fluid or blood. ? Warmth. ? Pus or a bad smell. How to empty the bag Supplies needed  Rubbing alcohol.  Gauze pad or cotton ball.  Tape or a leg strap. Emptying the bag Pour the pee out of your bag when it is ?- full, or at least 2-3 times a day. Do this for your overnight bag and your leg bag. 1. Wash your hands with soap and water. 2. Separate (detach) the bag from your leg. 3. Hold the bag over the toilet or a clean pail. Keep the bag lower than your hips and bladder. This is so the pee (urine) does not go back into the tube. 4. Open the pour spout. It is at the bottom of the bag. 5. Empty the pee into the toilet or  pail. Do not let the pour spout touch any surface. 6. Put rubbing alcohol on a gauze pad or cotton ball. 7. Use the gauze pad or cotton ball to clean the pour spout. 8. Close the pour spout. 9. Attach the bag to your leg with tape or a leg strap. 10. Wash your hands with soap and water. Follow instructions for cleaning the drainage bag:  From the product maker.  As told by your doctor. How to change the bag Supplies needed  Alcohol wipes.  A clean bag.  Tape or a leg strap. Changing the bag Replace your bag when it starts to leak, smell bad, or look dirty. 1. Wash your hands with soap and water. 2. Separate the dirty bag from your leg. 3. Pinch the catheter with your fingers so that pee does not spill out. 4. Separate the catheter tube from the bag tube where these tubes connect (at the connection valve). Do not let the tubes touch any surface. 5. Clean the end of the catheter tube with an alcohol wipe. Use a different alcohol wipe to clean the end of the bag tube. 6. Connect the catheter tube to the tube of the clean bag. 7. Attach the clean bag to your leg with tape or a leg strap. Do not make the bag tight on your leg. 8. Wash your hands with soap and water. General rules   Never pull on your catheter. Never try to take it out. Doing that can hurt you.  Always wash your hands before and after you touch your catheter or bag. Use a mild, fragrance-free soap. If you do not have soap and water, use hand sanitizer.  Always make sure there are no twists or bends (kinks) in the catheter tube.  Always make sure there are no leaks in the catheter or bag.  Drink enough fluid to keep your pee pale yellow.  Do not take baths, swim, or use a hot tub.  If you are male, wipe from front to back after you poop (have a bowel movement). Contact a doctor if:  Your pee is cloudy.  Your pee smells worse than usual.  Your catheter gets clogged.  Your catheter leaks.  Your bladder  feels full. Get help right away if:  You have redness, swelling, or pain where the catheter goes into your body.  You have fluid, blood, pus, or a bad smell coming from the area where the catheter goes into your body.  Your skin feels warm where  the catheter goes into your body.  You have a fever.  You have pain in your: ? Belly (abdomen). ? Legs. ? Lower back. ? Bladder.  You see blood in the catheter.  Your pee is pink or red.  You feel sick to your stomach (nauseous).  You throw up (vomit).  You have chills.  Your pee is not draining into the bag.  Your catheter gets pulled out. Summary  An indwelling urinary catheter is a thin tube that is placed into the bladder to help drain pee (urine) out of the body.  The catheter is placed into the part of the body that drains pee from the bladder (urethra).  Taking good care of your catheter will keep it working properly and help prevent problems.  Always wash your hands before and after touching your catheter or bag.  Never pull on your catheter or try to take it out. This information is not intended to replace advice given to you by your health care provider. Make sure you discuss any questions you have with your health care provider. Document Released: 09/09/2012 Document Revised: 09/06/2018 Document Reviewed: 12/29/2016 Elsevier Patient Education  [place]   1) The drugs that you were given will stay in your system until tomorrow so for the next 24 hours you should not:  A) Drive an automobile B) Make any legal decisions C) Drink any alcoholic beverage   2) You may resume regular meals tomorrow.  Today it is better to start with liquids and gradually work up to solid foods.  You may eat anything you prefer, but it is better to start with liquids, then soup and crackers, and gradually work up to solid foods.   3) Please notify your doctor immediately if  you have any unusual bleeding, trouble breathing, redness and pain at the surgery site, drainage, fever, or pain not relieved by medication.    4) Additional Instructions:        Please contact your physician with any problems or Same Day Surgery at [phone], Monday through Friday 6 am to 4 pm, or [place] at [facility] number at [phone].

## 2019-02-14 NOTE — Transfer of Care (Signed)
Immediate Anesthesia Transfer of Care Note  Patient: [name]  Procedure(s) Performed: TRANSURETHRAL RESECTION OF THE PROSTATE (TURP) (N/A )  Patient Location: PACU  Anesthesia Type:General  Level of Consciousness: awake, alert , oriented and patient cooperative  Airway & Oxygen Therapy: Patient Spontanous Breathing  Post-op Assessment: Report given to RN and Post -op Vital signs reviewed and stable  Post vital signs: Reviewed and stable  Last Vitals:  Vitals Value Taken Time  BP 159/87 02/14/19 0837  Temp    Pulse 82 02/14/19 0838  Resp 11 02/14/19 0838  SpO2 97 % 02/14/19 0838  Vitals shown include unvalidated device data.  Last Pain:  Vitals:   02/14/19 0637  TempSrc: Tympanic  PainSc: 0-No pain         Complications: No apparent anesthesia complications

## 2019-02-14 NOTE — Anesthesia Post-op Follow-up Note (Signed)
Anesthesia QCDR form completed.        

## 2019-02-14 NOTE — Anesthesia Postprocedure Evaluation (Signed)
Anesthesia Post Note  Patient: Deitrick Ferreri III  Procedure(s) Performed: TRANSURETHRAL RESECTION OF THE PROSTATE (TURP) (N/A )  Patient location during evaluation: PACU Anesthesia Type: General Level of consciousness: awake and alert Pain management: pain level controlled Vital Signs Assessment: post-procedure vital signs reviewed and stable Respiratory status: spontaneous breathing, nonlabored ventilation, respiratory function stable and patient connected to nasal cannula oxygen Cardiovascular status: blood pressure returned to baseline and stable Postop Assessment: no apparent nausea or vomiting Anesthetic complications: no     Last Vitals:  Vitals:   02/14/19 0919 02/14/19 1028  BP: (!) 149/89 (!) 151/76  Pulse: 75 67  Resp: 20 18  Temp: (!) 35.9 C   SpO2: 96% 96%    Last Pain:  Vitals:   02/14/19 1028  TempSrc:   PainSc: 0-No pain                 Arnie Clingenpeel S

## 2019-02-14 NOTE — Anesthesia Preprocedure Evaluation (Addendum)
Anesthesia Evaluation  Patient identified by MRN, date of birth, ID band Patient awake    Reviewed: Allergy & Precautions, NPO status , Patient's Chart, lab work & pertinent test results, reviewed documented beta blocker date and time   Airway Mallampati: III  TM Distance: >3 FB     Dental  (+) Upper Dentures, Lower Dentures   Pulmonary shortness of breath, COPD, former smoker,           Cardiovascular hypertension, Pt. on medications      Neuro/Psych PSYCHIATRIC DISORDERS Anxiety Depression    GI/Hepatic GERD  Controlled,  Endo/Other  Hypothyroidism   Renal/GU Renal disease     Musculoskeletal  (+) Arthritis ,   Abdominal   Peds  Hematology   Anesthesia Other Findings Lung ca. Adrenal function decreased. EKG checked and ok.  Reproductive/Obstetrics                            Anesthesia Physical Anesthesia Plan  ASA: III  Anesthesia Plan: General   Post-op Pain Management:    Induction: Intravenous  PONV Risk Score and Plan:   Airway Management Planned: Oral ETT and LMA  Additional Equipment:   Intra-op Plan:   Post-operative Plan:   Informed Consent: I have reviewed the patients History and Physical, chart, labs and discussed the procedure including the risks, benefits and alternatives for the proposed anesthesia with the patient or authorized representative who has indicated his/her understanding and acceptance.       Plan Discussed with: CRNA  Anesthesia Plan Comments:         Anesthesia Quick Evaluation

## 2019-02-14 NOTE — Op Note (Signed)
Date of procedure: 02/14/19  Preoperative diagnosis:  1. BPH with bladder outlet obstruction and incomplete emptying  Postoperative diagnosis:  1. Same  Procedure: 1. Bipolar TURP  Surgeon: Nickolas Madrid, MD  Anesthesia: General  Complications: None  Intraoperative findings:  1.  Short prostate with significant regrowth of bilateral apical tissue that appeared obstructive, as well as anterior nodule 2.  Large floppy bladder, ureteral orifices orthotopic, no bladder lesions 3.  Excellent hemostasis  EBL: Minimal  Specimens: Prostate chips for permanent  Drains: 66 French two-way Foley  Indication: Marc Gray is a 73 y.o. patient with history of TURP in 2013 with Dr. Jacqlyn Larsen for urinary retention with bilateral hydronephrosis and renal failure.  He presented with worsening urinary symptoms and elevated PVRs greater than 600 mL.  Urodynamics demonstrated bladder outlet obstruction with a high pressure bladder.  After reviewing the management options for treatment, they elected to proceed with the above surgical procedure(s). We have discussed the potential benefits and risks of the procedure, side effects of the proposed treatment, the likelihood of the patient achieving the goals of the procedure, and any potential problems that might occur during the procedure or recuperation. Informed consent has been obtained.  Description of procedure:  The patient was taken to the operating room and general anesthesia was induced. SCDs were placed for DVT prophylaxis. The patient was placed in the dorsal lithotomy position, prepped and draped in the usual sterile fashion, and preoperative antibiotics(Ancef) were administered. A preoperative time-out was performed.   DRE was performed and demonstrated a 30 g prostate that was smooth with no nodules or masses.  Leander Rams sounds were used to gently dilate the penile urethra.  A 21 French rigid cystoscope was used to intubate the  urethra and a normal-appearing urethra was followed proximally into the bladder.  The prostate was short and there was a TURP defect.  There was significant regrowth of apical tissue bilaterally that was tight and obstructive.  There was also regrowth of an anterior prostate nodule that projected into the bladder neck.  The ureteral orifices were orthotopic bilaterally and well away from the prostate.  The bladder was large and floppy, but thorough inspection demonstrated no mucosal lesions.  The 78 French resectoscope was then passed into the bladder using the visual obturator.  The bipolar large resecting loop was used to methodically resect the obstructing apical tissue bilaterally down to the capsule, as well as the anterior regrown tissue.  Meticulous hemostasis was achieved under low flow condition.  All prostate chips were irrigated free from the bladder and sent for pathology.  It was an approximately 20g resection.  With the bladder decompressed thorough inspection of the fossa revealed no significant bleeding.    A 22 [place] two-way Foley passed easily into the bladder with return of clear fluid.  25 mL were placed in the balloon.  The catheter irrigated easily with a To[place]y syringe and urine was clear.  This was connected to dependent drainage.  A belladonna suppository was placed.  Disposition: Stable to PACU  Plan: Follow-up in clinic on Monday for voiding trial, then 6 weeks for PVR  Nickolas Madrid, MD

## 2019-02-17 ENCOUNTER — Ambulatory Visit (INDEPENDENT_AMBULATORY_CARE_PROVIDER_SITE_OTHER): Payer: PPO | Admitting: Physician Assistant

## 2019-02-17 ENCOUNTER — Encounter: Payer: Self-pay | Admitting: Physician Assistant

## 2019-02-17 ENCOUNTER — Other Ambulatory Visit: Payer: Self-pay

## 2019-02-17 VITALS — BP 163/98 | HR 82 | Ht 73.0 in | Wt 240.5 lb

## 2019-02-17 DIAGNOSIS — Z466 Encounter for fitting and adjustment of urinary device: Secondary | ICD-10-CM

## 2019-02-17 LAB — BLADDER SCAN AMB NON-IMAGING: Scan Result: 0

## 2019-02-17 LAB — SURGICAL PATHOLOGY

## 2019-02-17 NOTE — Progress Notes (Signed)
02/17/2019 9:19 AM   Marc Gray 08/11/1945 [id number]  CC: Postop voiding trial  HPI: Marc Gray is a 73 y.o. male who presents for postoperative catheter removal and voiding trial s/p TURP with Dr. Diamantina Providence on 02/14/2019.  Procedure was without complications.  A 22 French two-way Foley was placed in the operating room with 70mL instilled in the balloon.  In the interim, patient reports minimal postoperative pain.  He states his catheter has been somewhat uncomfortable over the weekend associated with unintentional tugging of the Foley bag.  He reports mild gross hematuria and some drips of blood around the urethral meatus.  PMH: Past Medical History:  Diagnosis Date  . Anxiety   . Arthritis    SHOULDER  . Cataract   . Chronic kidney disease    low kidney function  . Colon cancer ([place]) 2015   Pt states during his colonoscopy he had cancerous polyps removed.   [name] COPD (chronic obstructive pulmonary disease) ([place])   . Depression    SINCE DIAGNOSIS  . Dyspnea    DOE  . Enlarged prostate   . GERD (gastroesophageal reflux disease)   . Hypertension   . Hypothyroidism   . Lung cancer [facility])    Aug 2018  . Pain    DUE TO LUNG ISSUE    Surgical History: Past Surgical History:  Procedure Laterality Date  . BACK SURGERY     kyphoplasty  T5-6  . CATARACT EXTRACTION W/ INTRAOCULAR LENS  IMPLANT, BILATERAL    . COLONOSCOPY    . EYE SURGERY Bilateral    cataract  . LYMPH GLAND EXCISION N/A 02/09/2017   Procedure: CERVICAL LYMPH NODE BIOPSY;  Surgeon: Robert Bellow, MD;  Location: [facility];  Service: General;  Laterality: N/A;  . PORTACATH PLACEMENT Right 02/23/2017   Procedure: INSERTION PORT-A-CATH;  Surgeon: Robert Bellow, MD;  Location: [facility];  Service: General;  Laterality: Right;  . PROSTATE SURGERY    . TONSILLECTOMY    . TRANSURETHRAL RESECTION OF PROSTATE N/A 02/14/2019   Procedure: TRANSURETHRAL RESECTION OF THE PROSTATE  (TURP);  Surgeon: [name], MD;  Location: [facility];  Service: Urology;  Laterality: N/A;    Home Medications:  Allergies as of 02/17/2019   No Known Allergies     Medication List       Accurate as of February 17, 2019  9:19 AM. If you have any questions, ask your nurse or doctor.        acetaminophen 500 MG tablet Commonly known as: TYLENOL Take 1,000 mg by mouth every 6 (six) hours as needed for moderate pain.   aspirin 325 MG tablet Take 650 mg by mouth daily.   budesonide-formoterol 160-4.5 MCG/ACT inhaler Commonly known as: Symbicort Inhale 2 puffs into the lungs 2 (two) times daily.   calcium-vitamin D 500-200 MG-UNIT tablet Commonly known as: OSCAL WITH D Take 1 tablet by mouth daily.   diphenhydrAMINE-zinc acetate cream Commonly known as: Benadryl Extra Strength Apply 1 application topically 3 (three) times daily as needed for itching.   fluticasone 50 MCG/ACT nasal spray Commonly known as: FLONASE Place 1 spray into both nostrils daily.   HYDROcodone-acetaminophen 5-325 MG tablet Commonly known as: NORCO/VICODIN Take 1 tablet by mouth every 4 (four) hours as needed for up to 3 days for moderate pain.   levothyroxine 175 MCG tablet Commonly known as: Synthroid Take 1 tablet (175 mcg total) by mouth daily before breakfast.   lidocaine-prilocaine cream  Commonly known as: EMLA Apply over port 1-2 hours prior to chemotherapy treatment.  Cover with plastic wrap.   Melatonin 10 MG Tabs Take 10 mg by mouth at bedtime.   Multi-Vitamins Tabs Take 1 tablet by mouth daily.   ondansetron 8 MG tablet Commonly known as: ZOFRAN Take 1 tablet (8 mg total) by mouth every 12 (twelve) hours as needed for nausea or vomiting.   pantoprazole 40 MG tablet Commonly known as: Protonix Take 1 tablet (40 mg total) by mouth daily.   predniSONE 10 MG tablet Commonly known as: DELTASONE Take one tablet each morning. Take one-half tablet each afternoon (take between  2 - 5 PM) What changed:   how much to take  how to take this  when to take this  additional instructions   ProAir HFA 108 (90 Base) MCG/ACT inhaler Generic drug: albuterol INHALE 2 PUFFS BY MOUTH EVERY 6 HOURS AS NEEDED What changed: reasons to take this   prochlorperazine 10 MG tablet Commonly known as: COMPAZINE Take 1 tablet (10 mg total) by mouth every 6 (six) hours as needed for nausea or vomiting.   sertraline 25 MG tablet Commonly known as: ZOLOFT Take 1 tablet (25 mg total) by mouth daily. What changed: additional instructions   Sinus & Allergy 4-10 MG tablet Generic drug: Chlorpheniramine-Phenylephrine Take 1 tablet by mouth every 6 (six) hours as needed for allergies.   sulfamethoxazole-trimethoprim 800-160 MG tablet Commonly known as: BACTRIM DS Take 1 tablet by mouth daily.   tadalafil 5 MG tablet Commonly known as: CIALIS Take 1 tablet (5 mg total) by mouth daily as needed for erectile dysfunction.   tamsulosin 0.4 MG Caps capsule Commonly known as: FLOMAX Take 1 capsule (0.4 mg total) by mouth daily.   tiotropium 18 MCG inhalation capsule Commonly known as: Spiriva HandiHaler INHALE 1 CAPSULE DAILY What changed:   how much to take  how to take this  when to take this  additional instructions   traMADol 50 MG tablet Commonly known as: ULTRAM Take 1 tablet (50 mg total) by mouth every 6 (six) hours as needed for moderate pain or severe pain.   triamcinolone ointment 0.5 % Commonly known as: KENALOG Apply 1 application topically 3 (three) times daily as needed.       Allergies: No Known Allergies  Family History: Family History  Problem Relation Age of Onset  . Breast cancer Maternal Grandmother   . Dementia Mother   . Diabetes Father   . Stroke Father     Social History:  reports that he quit smoking about 22 months ago. His smoking use included cigarettes. He has a 53.00 pack-year smoking history. He has never used smokeless  tobacco. He reports current alcohol use. He reports that he does not use drugs.  ROS: UROLOGY Frequent Urination?: No Hard to postpone urination?: No Burning/pain with urination?: No Get up at night to urinate?: No Leakage of urine?: No Urine stream starts and stops?: No Trouble starting stream?: No Do you have to strain to urinate?: No Blood in urine?: No Urinary tract infection?: No Sexually transmitted disease?: No Injury to kidneys or bladder?: No Painful intercourse?: No Weak stream?: No Erection problems?: No Penile pain?: No  Gastrointestinal Nausea?: No Vomiting?: No Indigestion/heartburn?: No Diarrhea?: No Constipation?: No  Constitutional Fever: No Night sweats?: No Weight loss?: No Fatigue?: No  Skin Skin rash/lesions?: No Itching?: No  Eyes Blurred vision?: No Double vision?: No  Ears/Nose/Throat Sore throat?: No Sinus problems?: No  Hematologic/Lymphatic  Swollen glands?: No Easy bruising?: No  Cardiovascular Leg swelling?: No Chest pain?: No  Respiratory Cough?: No Shortness of breath?: No  Endocrine Excessive thirst?: No  Musculoskeletal Back pain?: No Joint pain?: No  Neurological Headaches?: No Dizziness?: No  Psychologic Depression?: No Anxiety?: No  Physical Exam: BP (!) 163/98 (BP Location: Left Arm, Patient Position: Sitting, Cuff Size: Normal)   Pulse 82   Ht 6\' 1"  (1.854 m)   Wt 240 lb 8 oz (109.1 kg)   BMI 31.73 kg/m   Constitutional:  Alert and oriented, No acute distress. HEENT: [place] AT, moist mucus membranes. Cardiovascular: No clubbing, cyanosis, or edema. Respiratory: Increased work of breathing. GI: Abdomen is protuberant GU: Circumcized phallus with 2-way Foley catheter in place, erythematous urethral meatus. Skin: No rashes, bruises or suspicious lesions. Neurologic: Grossly intact, no focal deficits, moving all 4 extremities. Psychiatric: Normal mood and affect.  Laboratory Data: Results for  orders placed or performed in visit on 02/17/19  BLADDER SCAN AMB NON-IMAGING  Result Value Ref Range   Scan Result 0    Assessment & Plan:   Fill and Pull Catheter Removal  Patient is present today for a catheter removal.  Patient was cleaned and prepped in a sterile fashion 246mL of saline was instilled into the bladder when the patient felt the urge to urinate. 5mL of water was then drained from the balloon.  A 22FR foley cath was removed from the bladder; no complications were noted.  There was a small blood clot imbedded in the Foley tip. Patient was then given some time to void on their own.  Patient successfully voided in office; subsequent bladder scan with 51mL.  Patient tolerated well.  He was advised to call the office if he becomes unable to empty his bladder or if he develops fever or chills. He was advised that he may experience some dysuria over the next two days secondary to his catheter removal. He was advised that his gross hematuria should resolve over the coming week.  Performed by: Debroah Loop, PA-C  Follow up/ Additional notes: Return in about 6 weeks (around 03/31/2019) for Postop f/u with [facility].  Debroah Loop, PA-C  [facility] [address] [place],  [address] [phone]

## 2019-02-26 ENCOUNTER — Other Ambulatory Visit: Payer: Self-pay | Admitting: *Deleted

## 2019-02-26 MED ORDER — PANTOPRAZOLE SODIUM 40 MG PO TBEC: 40.0000 mg | DELAYED_RELEASE_TABLET | Freq: Every day | ORAL | 0 refills | Status: DC

## 2019-03-05 ENCOUNTER — Other Ambulatory Visit: Payer: Self-pay | Admitting: Nurse Practitioner

## 2019-03-10 ENCOUNTER — Other Ambulatory Visit: Payer: Self-pay

## 2019-03-11 ENCOUNTER — Other Ambulatory Visit: Payer: Self-pay

## 2019-03-11 ENCOUNTER — Inpatient Hospital Stay: Payer: PPO | Attending: Oncology

## 2019-03-11 DIAGNOSIS — Z95828 Presence of other vascular implants and grafts: Secondary | ICD-10-CM

## 2019-03-11 DIAGNOSIS — Z452 Encounter for adjustment and management of vascular access device: Secondary | ICD-10-CM | POA: Insufficient documentation

## 2019-03-11 DIAGNOSIS — Z87891 Personal history of nicotine dependence: Secondary | ICD-10-CM | POA: Insufficient documentation

## 2019-03-11 DIAGNOSIS — C3492 Malignant neoplasm of unspecified part of left bronchus or lung: Secondary | ICD-10-CM | POA: Diagnosis not present

## 2019-03-11 DIAGNOSIS — Z9221 Personal history of antineoplastic chemotherapy: Secondary | ICD-10-CM | POA: Insufficient documentation

## 2019-03-11 DIAGNOSIS — Z923 Personal history of irradiation: Secondary | ICD-10-CM | POA: Insufficient documentation

## 2019-03-11 MED ORDER — HEPARIN SOD (PORK) LOCK FLUSH 100 UNIT/ML IV SOLN
INTRAVENOUS | Status: AC
  Filled 2019-03-11: qty 5

## 2019-03-11 MED ORDER — SODIUM CHLORIDE 0.9% FLUSH
10.0000 mL | INTRAVENOUS | Status: DC | PRN
  Administered 2019-03-11: 10 mL via INTRAVENOUS
  Filled 2019-03-11: qty 10

## 2019-03-11 MED ORDER — HEPARIN SOD (PORK) LOCK FLUSH 100 UNIT/ML IV SOLN
500.0000 [IU] | Freq: Once | INTRAVENOUS | Status: AC
  Administered 2019-03-11: 500 [IU] via INTRAVENOUS

## 2019-03-28 ENCOUNTER — Other Ambulatory Visit: Payer: Self-pay | Admitting: *Deleted

## 2019-03-28 MED ORDER — LEVOTHYROXINE SODIUM 175 MCG PO TABS: 175.0000 ug | ORAL_TABLET | Freq: Every day | ORAL | 3 refills | Status: DC

## 2019-03-28 NOTE — Telephone Encounter (Signed)
   Ref Range & Units 59mo ago 70mo ago 54mo ago  TSH 0.350 - 4.500 uIU/mL 1.940  0.819 CM  2.515 CM   Comment: Performed by a 3rd Generation assay with a functional sensitivity of <=0.01 uIU/mL.  Performed at [facility], [address] [place], [place] [address]   Resulting Agency  [facility]      Specimen Collected: 02/07/19 09:52 Last Resulted: 02/07/19 10:55

## 2019-03-31 ENCOUNTER — Encounter: Payer: Self-pay | Admitting: Nurse Practitioner

## 2019-04-03 ENCOUNTER — Encounter: Payer: Self-pay | Admitting: Urology

## 2019-04-03 ENCOUNTER — Other Ambulatory Visit: Payer: Self-pay

## 2019-04-03 ENCOUNTER — Ambulatory Visit (INDEPENDENT_AMBULATORY_CARE_PROVIDER_SITE_OTHER): Payer: PPO | Admitting: Urology

## 2019-04-03 VITALS — BP 106/69 | HR 96 | Ht 69.0 in | Wt 239.0 lb

## 2019-04-03 DIAGNOSIS — C61 Malignant neoplasm of prostate: Secondary | ICD-10-CM

## 2019-04-03 DIAGNOSIS — R3914 Feeling of incomplete bladder emptying: Secondary | ICD-10-CM | POA: Diagnosis not present

## 2019-04-03 DIAGNOSIS — N401 Enlarged prostate with lower urinary tract symptoms: Secondary | ICD-10-CM

## 2019-04-03 LAB — BLADDER SCAN AMB NON-IMAGING

## 2019-04-03 NOTE — Progress Notes (Signed)
04/03/2019 4:59 PM   Marc Gray January 24, 1946 [id number]  Referring provider: [name], MD [address] [facility] [place],  [place] [address]  Chief Complaint  Patient presents with  . Follow-up    HPI: [name] is a 19 male who is status post bipolar TURP who presents today for follow-up.  He underwent a bipolar TURP on 02/14/2019.  Intraoperative findings a short prostate with significant regrowth of bilateral apical tissue that appeared obstructive, as well as anterior nodule.  Large floppy bladder, ureteral orifices orthotopic, no bladder lesions.  He had a successful fill and pull on February 17, 2019.   Prostate chips positive for Gleason's 3+3=6 with < 5% of submitted chips involved with tumor.   IPSS score: 6/1     PVR: 26 mL    Previous PVR: 570 mL   Major complaint(s):  None.  Denies any dysuria, hematuria or suprapubic pain.   Currently taking: tamsulosin 0.4 mg daily   Denies any recent fevers, chills, nausea or vomiting.   IPSS    Row Name 04/03/19 1300         International Prostate Symptom Score   How often have you had the sensation of not emptying your bladder?  Less than 1 in 5     How often have you had to urinate less than every two hours?  Less than 1 in 5 times     How often have you found you stopped and started again several times when you urinated?  Less than 1 in 5 times     How often have you found it difficult to postpone urination?  Less than 1 in 5 times     How often have you had a weak urinary stream?  Less than 1 in 5 times     How often have you had to strain to start urination?  Not at All     How many times did you typically get up at night to urinate?  1 Time     Total IPSS Score  6       Quality of Life due to urinary symptoms   If you were to spend the rest of your life with your urinary condition just the way it is now how would you feel about that?  Pleased        Score:  1-7 Mild  8-19 Moderate 20-35 Severe     PMH: Past Medical History:  Diagnosis Date  . Anxiety   . Arthritis    SHOULDER  . Cataract   . Chronic kidney disease    low kidney function  . Colon cancer ([place]) 2015   Pt states during his colonoscopy he had cancerous polyps removed.   [name] COPD (chronic obstructive pulmonary disease) ([place])   . Depression    SINCE DIAGNOSIS  . Dyspnea    DOE  . Enlarged prostate   . GERD (gastroesophageal reflux disease)   . Hypertension   . Hypothyroidism   . Lung cancer [facility])    Aug 2018  . Pain    DUE TO LUNG ISSUE    Surgical History: Past Surgical History:  Procedure Laterality Date  . BACK SURGERY     kyphoplasty  T5-6  . CATARACT EXTRACTION W/ INTRAOCULAR LENS  IMPLANT, BILATERAL    . COLONOSCOPY    . EYE SURGERY Bilateral    cataract  . LYMPH GLAND EXCISION N/A 02/09/2017   Procedure: CERVICAL LYMPH NODE  BIOPSY;  Surgeon: Robert Bellow, MD;  Location: [facility];  Service: General;  Laterality: N/A;  . PORTACATH PLACEMENT Right 02/23/2017   Procedure: INSERTION PORT-A-CATH;  Surgeon: Robert Bellow, MD;  Location: [facility];  Service: General;  Laterality: Right;  . PROSTATE SURGERY    . TONSILLECTOMY    . TRANSURETHRAL RESECTION OF PROSTATE N/A 02/14/2019   Procedure: TRANSURETHRAL RESECTION OF THE PROSTATE (TURP);  Surgeon: [name], MD;  Location: [facility];  Service: Urology;  Laterality: N/A;    Home Medications:  Allergies as of 04/03/2019   No Known Allergies     Medication List       Accurate as of April 03, 2019 11:59 PM. If you have any questions, ask your nurse or doctor.        acetaminophen 500 MG tablet Commonly known as: TYLENOL Take 1,000 mg by mouth every 6 (six) hours as needed for moderate pain.   aspirin 325 MG tablet Take 650 mg by mouth daily.   budesonide-formoterol 160-4.5 MCG/ACT inhaler Commonly known as: Symbicort Inhale 2 puffs into the lungs 2 (two) times daily.   calcium-vitamin D  500-200 MG-UNIT tablet Commonly known as: OSCAL WITH D Take 1 tablet by mouth daily.   diphenhydrAMINE-zinc acetate cream Commonly known as: Benadryl Extra Strength Apply 1 application topically 3 (three) times daily as needed for itching.   fluticasone 50 MCG/ACT nasal spray Commonly known as: FLONASE Place 1 spray into both nostrils daily.   levothyroxine 175 MCG tablet Commonly known as: Synthroid Take 1 tablet (175 mcg total) by mouth daily before breakfast.   lidocaine-prilocaine cream Commonly known as: EMLA Apply over port 1-2 hours prior to chemotherapy treatment.  Cover with plastic wrap.   Melatonin 10 MG Tabs Take 10 mg by mouth at bedtime.   Multi-Vitamins Tabs Take 1 tablet by mouth daily.   ondansetron 8 MG tablet Commonly known as: ZOFRAN Take 1 tablet (8 mg total) by mouth every 12 (twelve) hours as needed for nausea or vomiting.   pantoprazole 40 MG tablet Commonly known as: Protonix Take 1 tablet (40 mg total) by mouth daily.   predniSONE 10 MG tablet Commonly known as: DELTASONE Take one tablet each morning. Take one-half tablet each afternoon (take between 2 - 5 PM) What changed:   how much to take  how to take this  when to take this  additional instructions   ProAir HFA 108 (90 Base) MCG/ACT inhaler Generic drug: albuterol INHALE 2 PUFFS BY MOUTH EVERY 6 HOURS AS NEEDED What changed: reasons to take this   prochlorperazine 10 MG tablet Commonly known as: COMPAZINE Take 1 tablet (10 mg total) by mouth every 6 (six) hours as needed for nausea or vomiting.   sertraline 25 MG tablet Commonly known as: ZOLOFT TAKE 1 TABLET(25 MG) BY MOUTH DAILY   Sinus & Allergy 4-10 MG tablet Generic drug: Chlorpheniramine-Phenylephrine Take 1 tablet by mouth every 6 (six) hours as needed for allergies.   sulfamethoxazole-trimethoprim 800-160 MG tablet Commonly known as: BACTRIM DS Take 1 tablet by mouth daily.   tadalafil 5 MG tablet Commonly  known as: CIALIS Take 1 tablet (5 mg total) by mouth daily as needed for erectile dysfunction.   tamsulosin 0.4 MG Caps capsule Commonly known as: FLOMAX Take 1 capsule (0.4 mg total) by mouth daily.   tiotropium 18 MCG inhalation capsule Commonly known as: Spiriva HandiHaler INHALE 1 CAPSULE DAILY What changed:   how much to take  how  to take this  when to take this  additional instructions   traMADol 50 MG tablet Commonly known as: ULTRAM Take 1 tablet (50 mg total) by mouth every 6 (six) hours as needed for moderate pain or severe pain.   triamcinolone ointment 0.5 % Commonly known as: KENALOG Apply 1 application topically 3 (three) times daily as needed.       Allergies: No Known Allergies  Family History: Family History  Problem Relation Age of Onset  . Breast cancer Maternal Grandmother   . Dementia Mother   . Diabetes Father   . Stroke Father     Social History:  reports that he quit smoking about 2 years ago. His smoking use included cigarettes. He has a 53.00 pack-year smoking history. He has never used smokeless tobacco. He reports current alcohol use. He reports that he does not use drugs.  ROS: UROLOGY Frequent Urination?: No Hard to postpone urination?: No Burning/pain with urination?: No Get up at night to urinate?: No Leakage of urine?: No Urine stream starts and stops?: No Trouble starting stream?: No Do you have to strain to urinate?: No Blood in urine?: No Urinary tract infection?: No Sexually transmitted disease?: No Injury to kidneys or bladder?: No Painful intercourse?: No Weak stream?: No Erection problems?: No Penile pain?: No  Gastrointestinal Nausea?: No Vomiting?: No Indigestion/heartburn?: No Diarrhea?: No Constipation?: No  Constitutional Fever: No Night sweats?: No Weight loss?: No Fatigue?: No  Skin Skin rash/lesions?: No Itching?: Yes  Eyes Blurred vision?: No Double vision?: No  Ears/Nose/Throat Sore  throat?: No Sinus problems?: Yes  Hematologic/Lymphatic Swollen glands?: No Easy bruising?: No  Cardiovascular Leg swelling?: No Chest pain?: No  Respiratory Cough?: No Shortness of breath?: Yes  Endocrine Excessive thirst?: No  Musculoskeletal Back pain?: No Joint pain?: No  Neurological Headaches?: No Dizziness?: No  Psychologic Depression?: No Anxiety?: No  Physical Exam: BP 106/69   Pulse 96   Ht 5\' 9"  (1.753 m)   Wt 239 lb (108.4 kg)   BMI 35.29 kg/m   Constitutional:  Well nourished. Alert and oriented, No acute distress. HEENT: [place] AT, moist mucus membranes.  Trachea midline, no masses. Cardiovascular: No clubbing, cyanosis, or edema. Respiratory: Normal respiratory effort, no increased work of breathing. Neurologic: Grossly intact, no focal deficits, moving all 4 extremities. Psychiatric: Normal mood and affect.  Laboratory Data: PSA 1.22 in 11/2016 PSA 1.44 in 08/2018  Component     Latest Ref Rng & Units 04/03/2019  Prostate Specific Ag, Serum     0.0 - 4.0 ng/mL 1.4   Lab Results  Component Value Date   WBC 7.3 02/07/2019   HGB 12.9 (L) 02/07/2019   HCT 40.5 02/07/2019   MCV 91.6 02/07/2019   PLT 171 02/07/2019    Lab Results  Component Value Date   CREATININE 1.35 (H) 02/07/2019    No results found for: PSA  No results found for: TESTOSTERONE  No results found for: HGBA1C  Lab Results  Component Value Date   TSH 1.940 02/07/2019    No results found for: CHOL, HDL, CHOLHDL, VLDL, LDLCALC  Lab Results  Component Value Date   AST 18 02/07/2019   Lab Results  Component Value Date   ALT 24 02/07/2019   No components found for: ALKALINEPHOPHATASE No components found for: BILIRUBINTOTAL  No results found for: ESTRADIOL  Urinalysis    Component Value Date/Time   COLORURINE YELLOW (A) 02/07/2019 [place] (A) 02/07/2019 [place]  Clear 02/05/2019 1325   LABSPEC 1.014 02/07/2019 1049   LABSPEC  1.008 09/27/2011 [place] 6.0 02/07/2019 1049   GLUCOSEU NEGATIVE 02/07/2019 1049   GLUCOSEU Negative 09/27/2011 1748   HGBUR NEGATIVE 02/07/2019 1049   BILIRUBINUR NEGATIVE 02/07/2019 1049   BILIRUBINUR Negative 02/05/2019 1325   BILIRUBINUR Negative 09/27/2011 [place] 02/07/2019 [place] 02/07/2019 1049   NITRITE NEGATIVE 02/07/2019 1049   LEUKOCYTESUR NEGATIVE 02/07/2019 1049   LEUKOCYTESUR Negative 09/27/2011 1748    I have reviewed the labs.   Pertinent Imaging: Results for DELYNN, PURSLEY Gray "Lowman" (MRN [id number]) as of 04/17/2019 16:41  Ref. Range 04/03/2019 13:23  Scan Result Unknown 26mL     Assessment & Plan:    1. Benign prostatic hyperplasia with incomplete bladder emptying - s/p bipolar TURP - voiding well - Bladder Scan (Post Void Residual) in office - he may choose to discontinue the tamsulosin and restart if symptoms worsen  - RTC in 6 months for I PSS, PVR, PSA and exam  2. Prostate cancer Gleason's 3 + 3 found in < 5% of submitted prostate chips Given patient's age and co-morbidities will proceed with active surveillance PSA stable RTC in 6 months for exam and PSA  Return in 6 months (on 10/01/2019) for IPSS, PSA and exam.  These notes generated with voice recognition software. I apologize for typographical errors.  Zara Council, PA-C  [facility] [address]  [place], [place] [address] [phone]

## 2019-04-04 LAB — PSA: Prostate Specific Ag, Serum: 1.4 ng/mL (ref 0.0–4.0)

## 2019-04-07 ENCOUNTER — Telehealth: Payer: Self-pay

## 2019-04-07 DIAGNOSIS — N401 Enlarged prostate with lower urinary tract symptoms: Secondary | ICD-10-CM

## 2019-04-07 DIAGNOSIS — R3914 Feeling of incomplete bladder emptying: Secondary | ICD-10-CM

## 2019-04-07 DIAGNOSIS — C61 Malignant neoplasm of prostate: Secondary | ICD-10-CM

## 2019-04-07 NOTE — Telephone Encounter (Signed)
-----   Message from Nori Riis, PA-C sent at 04/04/2019  8:01 AM EST ----- Please let Mr. Gossman know that his PSA is stable at 1.4.  This is good news.  I would like for him to follow-up in 6 months with Dr. Diamantina Providence with PSA prior to that appointment.

## 2019-04-07 NOTE — Telephone Encounter (Signed)
Patient aware of results.  Follow up appointments scheduled.  Order placed for PSA.

## 2019-05-06 ENCOUNTER — Inpatient Hospital Stay: Payer: PPO

## 2019-05-07 ENCOUNTER — Ambulatory Visit
Admission: RE | Admit: 2019-05-07 | Discharge: 2019-05-07 | Disposition: A | Discharge status: Home / Self Care | Payer: PPO | Source: Ambulatory Visit | Attending: Oncology | Admitting: Oncology

## 2019-05-07 ENCOUNTER — Other Ambulatory Visit: Payer: Self-pay

## 2019-05-07 DIAGNOSIS — C3492 Malignant neoplasm of unspecified part of left bronchus or lung: Secondary | ICD-10-CM | POA: Diagnosis not present

## 2019-05-09 ENCOUNTER — Other Ambulatory Visit: Payer: Self-pay

## 2019-05-09 ENCOUNTER — Inpatient Hospital Stay: Payer: PPO | Attending: Oncology | Admitting: Oncology

## 2019-05-09 ENCOUNTER — Inpatient Hospital Stay: Payer: PPO

## 2019-05-09 VITALS — BP 134/86 | HR 71 | Temp 97.7°F | Resp 18 | Wt 242.4 lb

## 2019-05-09 DIAGNOSIS — D472 Monoclonal gammopathy: Secondary | ICD-10-CM

## 2019-05-09 DIAGNOSIS — Z7951 Long term (current) use of inhaled steroids: Secondary | ICD-10-CM | POA: Insufficient documentation

## 2019-05-09 DIAGNOSIS — C61 Malignant neoplasm of prostate: Secondary | ICD-10-CM | POA: Insufficient documentation

## 2019-05-09 DIAGNOSIS — J449 Chronic obstructive pulmonary disease, unspecified: Secondary | ICD-10-CM | POA: Diagnosis not present

## 2019-05-09 DIAGNOSIS — F419 Anxiety disorder, unspecified: Secondary | ICD-10-CM | POA: Diagnosis not present

## 2019-05-09 DIAGNOSIS — Z7982 Long term (current) use of aspirin: Secondary | ICD-10-CM | POA: Diagnosis not present

## 2019-05-09 DIAGNOSIS — N189 Chronic kidney disease, unspecified: Secondary | ICD-10-CM | POA: Insufficient documentation

## 2019-05-09 DIAGNOSIS — Z85038 Personal history of other malignant neoplasm of large intestine: Secondary | ICD-10-CM | POA: Diagnosis not present

## 2019-05-09 DIAGNOSIS — Z79899 Other long term (current) drug therapy: Secondary | ICD-10-CM | POA: Diagnosis not present

## 2019-05-09 DIAGNOSIS — I129 Hypertensive chronic kidney disease with stage 1 through stage 4 chronic kidney disease, or unspecified chronic kidney disease: Secondary | ICD-10-CM | POA: Insufficient documentation

## 2019-05-09 DIAGNOSIS — F329 Major depressive disorder, single episode, unspecified: Secondary | ICD-10-CM | POA: Insufficient documentation

## 2019-05-09 DIAGNOSIS — I700 Atherosclerosis of aorta: Secondary | ICD-10-CM | POA: Insufficient documentation

## 2019-05-09 DIAGNOSIS — C3492 Malignant neoplasm of unspecified part of left bronchus or lung: Secondary | ICD-10-CM

## 2019-05-09 DIAGNOSIS — F411 Generalized anxiety disorder: Secondary | ICD-10-CM

## 2019-05-09 DIAGNOSIS — Z7952 Long term (current) use of systemic steroids: Secondary | ICD-10-CM | POA: Insufficient documentation

## 2019-05-09 DIAGNOSIS — Z95828 Presence of other vascular implants and grafts: Secondary | ICD-10-CM

## 2019-05-09 DIAGNOSIS — E2740 Unspecified adrenocortical insufficiency: Secondary | ICD-10-CM | POA: Insufficient documentation

## 2019-05-09 DIAGNOSIS — E039 Hypothyroidism, unspecified: Secondary | ICD-10-CM | POA: Diagnosis not present

## 2019-05-09 DIAGNOSIS — Z87891 Personal history of nicotine dependence: Secondary | ICD-10-CM | POA: Insufficient documentation

## 2019-05-09 DIAGNOSIS — K219 Gastro-esophageal reflux disease without esophagitis: Secondary | ICD-10-CM | POA: Insufficient documentation

## 2019-05-09 DIAGNOSIS — T50905A Adverse effect of unspecified drugs, medicaments and biological substances, initial encounter: Secondary | ICD-10-CM

## 2019-05-09 DIAGNOSIS — M1990 Unspecified osteoarthritis, unspecified site: Secondary | ICD-10-CM | POA: Diagnosis not present

## 2019-05-09 DIAGNOSIS — F418 Other specified anxiety disorders: Secondary | ICD-10-CM

## 2019-05-09 DIAGNOSIS — R635 Abnormal weight gain: Secondary | ICD-10-CM

## 2019-05-09 LAB — CBC WITH DIFFERENTIAL/PLATELET
Abs Immature Granulocytes: 0.05 10*3/uL (ref 0.00–0.07)
Basophils Absolute: 0 10*3/uL (ref 0.0–0.1)
Basophils Relative: 1 %
Eosinophils Absolute: 0.2 10*3/uL (ref 0.0–0.5)
Eosinophils Relative: 3 %
HCT: 40.8 % (ref 39.0–52.0)
Hemoglobin: 12.6 g/dL — ABNORMAL LOW (ref 13.0–17.0)
Immature Granulocytes: 1 %
Lymphocytes Relative: 22 %
Lymphs Abs: 1.3 10*3/uL (ref 0.7–4.0)
MCH: 29.2 pg (ref 26.0–34.0)
MCHC: 30.9 g/dL (ref 30.0–36.0)
MCV: 94.7 fL (ref 80.0–100.0)
Monocytes Absolute: 0.8 10*3/uL (ref 0.1–1.0)
Monocytes Relative: 12 %
Neutro Abs: 3.7 10*3/uL (ref 1.7–7.7)
Neutrophils Relative %: 61 %
Platelets: 183 10*3/uL (ref 150–400)
RBC: 4.31 MIL/uL (ref 4.22–5.81)
RDW: 15.7 % — ABNORMAL HIGH (ref 11.5–15.5)
WBC: 6 10*3/uL (ref 4.0–10.5)
nRBC: 0 % (ref 0.0–0.2)

## 2019-05-09 LAB — COMPREHENSIVE METABOLIC PANEL
ALT: 23 U/L (ref 0–44)
AST: 17 U/L (ref 15–41)
Albumin: 3.4 g/dL — ABNORMAL LOW (ref 3.5–5.0)
Alkaline Phosphatase: 61 U/L (ref 38–126)
Anion gap: 8 (ref 5–15)
BUN: 22 mg/dL (ref 8–23)
CO2: 26 mmol/L (ref 22–32)
Calcium: 8.6 mg/dL — ABNORMAL LOW (ref 8.9–10.3)
Chloride: 105 mmol/L (ref 98–111)
Creatinine, Ser: 1.32 mg/dL — ABNORMAL HIGH (ref 0.61–1.24)
GFR calc Af Amer: >60 mL/min (ref >60)
GFR calc non Af Amer: 54 mL/min — ABNORMAL LOW (ref >60)
Glucose, Bld: 97 mg/dL (ref 70–99)
Potassium: 3.8 mmol/L (ref 3.5–5.1)
Sodium: 139 mmol/L (ref 135–145)
Total Bilirubin: 0.7 mg/dL (ref 0.3–1.2)
Total Protein: 6.4 g/dL — ABNORMAL LOW (ref 6.5–8.1)

## 2019-05-09 LAB — TSH: TSH: 0.384 u[IU]/mL (ref 0.350–4.500)

## 2019-05-09 MED ORDER — HEPARIN SOD (PORK) LOCK FLUSH 100 UNIT/ML IV SOLN
500.0000 [IU] | Freq: Once | INTRAVENOUS | Status: AC
  Administered 2019-05-09: 500 [IU] via INTRAVENOUS

## 2019-05-09 MED ORDER — SODIUM CHLORIDE 0.9% FLUSH
10.0000 mL | Freq: Once | INTRAVENOUS | Status: AC
  Administered 2019-05-09: 14:00:00 10 mL via INTRAVENOUS
  Filled 2019-05-09: qty 10

## 2019-05-09 NOTE — Progress Notes (Signed)
Concern with knots on left axillary area.  Patient had a TURP on 02/14/19 and did have biopsy of specimen from procedure.

## 2019-05-11 ENCOUNTER — Encounter: Payer: Self-pay | Admitting: Oncology

## 2019-05-11 NOTE — Progress Notes (Signed)
Hematology/Oncology Follow Up Note [facility] [phone] [phone]   Patient Care Team: Hedrick, James, MD as PCP - General (Family Medicine) Byrnett, Jeffrey W, MD (General Surgery) Yu, Zhou, MD as Consulting Physician (Oncology) Rhode, Hayley, RN as Registered Nurse  Reason for Visit:  Follow up for lung cancer, immunotherapy side effects, adrenal insufficiency.  HISTORY OF PRESENTING ILLNESS:  Marc Gray 73 y.o.  male who presents for treatment of Stage IIIB (cT2b, cN3, cM0)  Adenocarcinoma of lung.  # Patient is s/p carbo and taxol concurrent RT for treatment of Stage IIIB Lung cancer. He recieved additional lung boost RT Currently on Durvalumab maintenance.   # Colon CA was listed in his history, however reviewing his surgical pathology from colonoscopy on 04/15/2014, during which he had polyps removed and all are negative for cancer.patient is scheduled for colonoscopy screening this year.  # 5/1/ 2019 CT scan was discussed at tumor board and changes consistent with radiation changes  INTERVAL HISTORY Patient he presents for follow-up of fo stage IIIB adenocarcinoma of the lung. Patient continues to feel well today. Patient had TURP procedure done on 02/14/2019.  Pathology showed prostate adenocarcinoma, Gleason score 3+3, tumor quantitation, estimated percentage of prostate tissue involved by tumor less than 5%. No perineural invasion identified. Patient follows up with urologist and was recommended to observe.  Otherwise he reports feeling well. Patient has gained 3 pounds since last visit. Appetite is good. Denies any hemoptysis, chest pain, cough, abdominal pain, unintentional weight loss, fever or chills. Patient is on chronic steroid use for adrenal insufficiency.   Review of Systems  Constitutional: Negative for appetite change, chills, fatigue, fever and unexpected weight change.  HENT:   Negative for  hearing loss and voice change.   Eyes: Negative for eye problems and icterus.  Respiratory: Negative for chest tightness, cough and shortness of breath.   Cardiovascular: Negative for chest pain and leg swelling.  Gastrointestinal: Negative for abdominal distention, abdominal pain and blood in stool.  Endocrine: Negative for hot flashes.  Genitourinary: Negative for difficulty urinating, dysuria and frequency.   Musculoskeletal: Negative for arthralgias and back pain.  Skin: Negative for itching and rash.  Neurological: Negative for extremity weakness, light-headedness and numbness.  Hematological: Negative for adenopathy. Does not bruise/bleed easily.  Psychiatric/Behavioral: Negative for confusion.    MEDICAL HISTORY:  Past Medical History:  Diagnosis Date  . Anxiety   . Arthritis    SHOULDER  . Cataract   . Chronic kidney disease    low kidney function  . Colon cancer (HCC) 2015   Pt states during his colonoscopy he had cancerous polyps removed.   . COPD (chronic obstructive pulmonary disease) (HCC)   . Depression    SINCE DIAGNOSIS  . Dyspnea    DOE  . Enlarged prostate   . GERD (gastroesophageal reflux disease)   . Hypertension   . Hypothyroidism   . Lung cancer (HCC)    Aug 2018  . Pain    DUE TO LUNG ISSUE    SURGICAL HISTORY: Past Surgical History:  Procedure Laterality Date  . BACK SURGERY     kyphoplasty  T5-6  . CATARACT EXTRACTION W/ INTRAOCULAR LENS  IMPLANT, BILATERAL    . COLONOSCOPY    . EYE SURGERY Bilateral    cataract  . LYMPH GLAND EXCISION N/A 02/09/2017   Procedure: CERVICAL LYMPH NODE BIOPSY;  Surgeon: Byrnett, Jeffrey W, MD;  Location: [facility] ORS;  Service: General;  Laterality: N/A;  .   PORTACATH PLACEMENT Right 02/23/2017   Procedure: INSERTION PORT-A-CATH;  Surgeon: Byrnett, Jeffrey W, MD;  Location: [facility] ORS;  Service: General;  Laterality: Right;  . PROSTATE SURGERY    . TONSILLECTOMY    . TRANSURETHRAL RESECTION OF PROSTATE N/A 02/14/2019     Procedure: TRANSURETHRAL RESECTION OF THE PROSTATE (TURP);  Surgeon: Sninsky, Brian C, MD;  Location: [facility] ORS;  Service: Urology;  Laterality: N/A;    SOCIAL HISTORY: Social History   Socioeconomic History  . Marital status: Married    Spouse name: Not on file  . Number of children: Not on file  . Years of education: Not on file  . Highest education level: Not on file  Occupational History  . Not on file  Tobacco Use  . Smoking status: Former Smoker    Packs/day: 1.00    Years: 53.00    Pack years: 53.00    Types: Cigarettes    Quit date: 04/14/2017    Years since quitting: 2.0  . Smokeless tobacco: Never Used  . Tobacco comment: Quit November 2018  Substance and Sexual Activity  . Alcohol use: Yes    Comment: occassional  . Drug use: No  . Sexual activity: Yes  Other Topics Concern  . Not on file  Social History Narrative  . Not on file   Social Determinants of Health   Financial Resource Strain:   . Difficulty of Paying Living Expenses: Not on file  Food Insecurity:   . Worried About Running Out of Food in the Last Year: Not on file  . Ran Out of Food in the Last Year: Not on file  Transportation Needs:   . Lack of Transportation (Medical): Not on file  . Lack of Transportation (Non-Medical): Not on file  Physical Activity:   . Days of Exercise per Week: Not on file  . Minutes of Exercise per Session: Not on file  Stress:   . Feeling of Stress : Not on file  Social Connections:   . Frequency of Communication with Friends and Family: Not on file  . Frequency of Social Gatherings with Friends and Family: Not on file  . Attends Religious Services: Not on file  . Active Member of Clubs or Organizations: Not on file  . Attends Club or Organization Meetings: Not on file  . Marital Status: Not on file  Intimate Partner Violence:   . Fear of Current or Ex-Partner: Not on file  . Emotionally Abused: Not on file  . Physically Abused: Not on file  . Sexually  Abused: Not on file    FAMILY HISTORY: Family History  Problem Relation Age of Onset  . Breast cancer Maternal Grandmother   . Dementia Mother   . Diabetes Father   . Stroke Father     ALLERGIES:  has No Known Allergies.  MEDICATIONS:  Current Outpatient Medications  Medication Sig Dispense Refill  . acetaminophen (TYLENOL) 500 MG tablet Take 1,000 mg by mouth every 6 (six) hours as needed for moderate pain.    . aspirin 325 MG tablet Take 650 mg by mouth daily.     . budesonide-formoterol (SYMBICORT) 160-4.5 MCG/ACT inhaler Inhale 2 puffs into the lungs 2 (two) times daily. 1 Inhaler 1  . calcium-vitamin D (OSCAL WITH D) 500-200 MG-UNIT tablet Take 1 tablet by mouth daily. 90 tablet 1  . Chlorpheniramine-Phenylephrine (SINUS & ALLERGY) 4-10 MG tablet Take 1 tablet by mouth every 6 (six) hours as needed for allergies.     . diphenhydrAMINE-zinc   acetate (BENADRYL EXTRA STRENGTH) cream Apply 1 application topically 3 (three) times daily as needed for itching. 28 g 0  . fluticasone (FLONASE) 50 MCG/ACT nasal spray Place 1 spray into both nostrils daily. 16 g 2  . levothyroxine (SYNTHROID) 175 MCG tablet Take 1 tablet (175 mcg total) by mouth daily before breakfast. 30 tablet 3  . lidocaine-prilocaine (EMLA) cream Apply over port 1-2 hours prior to chemotherapy treatment.  Cover with plastic wrap. 30 g 1  . Melatonin 10 MG TABS Take 10 mg by mouth at bedtime.     . Multiple Vitamin (MULTI-VITAMINS) TABS Take 1 tablet by mouth daily.     . pantoprazole (PROTONIX) 40 MG tablet Take 1 tablet (40 mg total) by mouth daily. 90 tablet 0  . predniSONE (DELTASONE) 10 MG tablet Take one tablet each morning. Take one-half tablet each afternoon (take between 2 - 5 PM) (Patient taking differently: Take 5-10 mg by mouth See admin instructions. Take 10 mg by mouth in the morning and 5 mg in the late afternoon (between 2 - 5 PM)) 45 tablet 0  . PROAIR HFA 108 (90 Base) MCG/ACT inhaler INHALE 2 PUFFS BY  MOUTH EVERY 6 HOURS AS NEEDED (Patient taking differently: Inhale 2 puffs into the lungs every 6 (six) hours as needed for wheezing or shortness of breath. ) 8.5 g 0  . sertraline (ZOLOFT) 25 MG tablet TAKE 1 TABLET(25 MG) BY MOUTH DAILY 90 tablet 0  . tadalafil (CIALIS) 5 MG tablet Take 1 tablet (5 mg total) by mouth daily as needed for erectile dysfunction. 30 tablet 11  . tamsulosin (FLOMAX) 0.4 MG CAPS capsule Take 1 capsule (0.4 mg total) by mouth daily. 90 capsule 1  . tiotropium (SPIRIVA HANDIHALER) 18 MCG inhalation capsule INHALE 1 CAPSULE DAILY (Patient taking differently: Place 18 mcg into inhaler and inhale daily. ) 30 capsule 0  . triamcinolone ointment (KENALOG) 0.5 % Apply 1 application topically 3 (three) times daily as needed. 30 g 1  . ondansetron (ZOFRAN) 8 MG tablet Take 1 tablet (8 mg total) by mouth every 12 (twelve) hours as needed for nausea or vomiting. (Patient not taking: Reported on 05/09/2019) 60 tablet 0  . prochlorperazine (COMPAZINE) 10 MG tablet Take 1 tablet (10 mg total) by mouth every 6 (six) hours as needed for nausea or vomiting. (Patient not taking: Reported on 05/09/2019) 60 tablet 2  . traMADol (ULTRAM) 50 MG tablet Take 1 tablet (50 mg total) by mouth every 6 (six) hours as needed for moderate pain or severe pain. (Patient not taking: Reported on 05/09/2019) 30 tablet 0   No current facility-administered medications for this visit.   Facility-Administered Medications Ordered in Other Visits  Medication Dose Route Frequency Provider Last Rate Last Admin  . sodium chloride flush (NS) 0.9 % injection 10 mL  10 mL Intravenous PRN [name], MD   10 mL at 04/03/17 0347      .  PHYSICAL EXAMINATION: ECOG PERFORMANCE STATUS: 1 - Symptomatic but completely ambulatory Vitals:   05/09/19 1419  BP: 134/86  Pulse: 71  Resp: 18  Temp: 97.7 F (36.5 C)   Filed Weights   05/09/19 1419  Weight: 242 lb 6.4 oz (110 kg)  Physical Exam  Constitutional: He is  oriented to person, place, and time. No distress.  Obese  HENT:  Head: Normocephalic and atraumatic.  Right Ear: External ear normal.  Left Ear: External ear normal.  Nose: Nose normal.  Mouth/Throat: Oropharynx is clear and  moist. No oropharyngeal exudate.  Eyes: Pupils are equal, round, and reactive to light. EOM are normal. No scleral icterus.  Cardiovascular: Normal rate, regular rhythm and normal heart sounds.  No murmur heard. Pulmonary/Chest: Effort normal. No respiratory distress. He has no wheezes. He has no rales. He exhibits no tenderness.  Decreased sound bilaterally lower lobe.   Abdominal: Soft. Bowel sounds are normal. He exhibits no distension. There is no abdominal tenderness.  Musculoskeletal:        General: No tenderness or edema. Normal range of motion.     Cervical back: Normal range of motion and neck supple.  Lymphadenopathy:    He has no cervical adenopathy.  Neurological: He is alert and oriented to person, place, and time. No cranial nerve deficit. He exhibits normal muscle tone. Coordination normal.  Skin: Skin is warm and dry. No rash noted. He is not diaphoretic. No erythema.  Psychiatric: Memory, affect and judgment normal.   LABORATORY DATA:  I have reviewed the data as listed Lab Results  Component Value Date   WBC 6.0 05/09/2019   HGB 12.6 (L) 05/09/2019   HCT 40.8 05/09/2019   MCV 94.7 05/09/2019   PLT 183 05/09/2019   Recent Labs    11/05/18 1303 02/07/19 0952 05/09/19 1349  NA 142 140 139  K 3.9 4.2 3.8  CL 108 105 105  CO2 25 27 26  GLUCOSE 109* 107* 97  BUN 30* 25* 22  CREATININE 1.73* 1.35* 1.32*  CALCIUM 8.6* 8.9 8.6*  GFRNONAA 39* 52* 54*  GFRAA 45* >60 >60  PROT 6.6 6.2* 6.4*  ALBUMIN 3.6 3.6 3.4*  AST 20 18 17  ALT 24 24 23  ALKPHOS 63 62 61  BILITOT 0.5 0.5 0.7  RADIOGRAPHIC STUDIES: I have personally reviewed the radiological images as listed and agreed with the findings in the report. CT chest without contrast  11/04/2018  1 Post treatment scarring in the medial left hemithorax, stable. No evidence recurrent or metastatic disease. 2. Ascending Aortic aneurysm NOS (ICD10-I71.9). Recommend annual imaging followup by CTA or MRA. This recommendation follows 2010 ACCF/AHA/AATS/ACR/ASA/SCA/SCAI/SIR/STS/SVM Guidelines for the Diagnosis and Management of Patients with Thoracic Aortic Disease. Circulation. 2010; 121: E266-e369. Aortic aneurysm NOS (ICD10-I71.9). 3. Aortic atherosclerosis (ICD10-170.0). Coronary artery calcification. 4. Centrilobular emphysema.  ASSESSMENT & PLAN:  Cancer Staging Adenocarcinoma of left lung, stage 3 (HCC) Staging form: Lung, AJCC 8th Edition - Clinical stage from 02/14/2017: Stage IIIB (cT2b, cN3, cM0) - Signed by Yu, Zhou, MD on 02/14/2017  1. Adenocarcinoma of left lung, stage 3 (HCC)   2. Port-A-Cath in place   3. MGUS (monoclonal gammopathy of unknown significance)   4. Hypothyroidism, unspecified type   5. Weight gain due to medication   6. Adrenal insufficiency (HCC)   7. Anxiety associated with cancer diagnosis    #Stage IIIB lung adenocarcinoma, s/p concurrent chemo/RT and immunotherapy.  Interval CT scan was independently reviewed by me and discussed with the patient. No evidence of progression or recurrent disease. Continue CT surveillance every 3 months.  #Chronic steroid use for adrenal insufficiency. Continue follow-up with Dr. Solum #Weight gain, secondary to chronic steroid use.  Lifestyle modification discussed with patient. #Hypothyroidism, TSH has been monitored.  Patient is on Synthroid 175 MCG daily.  Continue follow-up with endocrinology. # MGUS, check, multiple myeloma panel, result is pending today. #CKD, avoid NSAIDs. #Prostate adenocarcinoma, Gleason score 6, continue observation.  Managed by urology.   Repeat CT scan in 3 months and follow-up in the clinic.   Orders Placed This Encounter  Procedures  . CT Chest Wo Contrast    Standing  Status:   Future    Standing Expiration Date:   05/08/2020    Order Specific Question:   ** REASON FOR EXAM (FREE TEXT)    Answer:   history of lung caner    Order Specific Question:   Preferred imaging location?    Answer:   [facility]    Order Specific Question:   Radiology Contrast Protocol - do NOT remove file path    Answer:   [email or web address]  . CBC with Differential    Standing Status:   Future    Standing Expiration Date:   05/08/2020  . Comprehensive metabolic panel    Standing Status:   Future    Standing Expiration Date:   05/08/2020   Follow up in 3 months, repeat CT chest surveillance scan.   Zhou Yu, MD, PhD  05/11/2019  

## 2019-05-12 LAB — KAPPA/LAMBDA LIGHT CHAINS
Kappa free light chain: 40.4 mg/L — ABNORMAL HIGH (ref 3.3–19.4)
Kappa, lambda light chain ratio: 1.57 (ref 0.26–1.65)
Lambda free light chains: 25.8 mg/L (ref 5.7–26.3)

## 2019-05-14 LAB — MULTIPLE MYELOMA PANEL, SERUM
Albumin SerPl Elph-Mcnc: 3.3 g/dL (ref 2.9–4.4)
Albumin/Glob SerPl: 1.4 (ref 0.7–1.7)
Alpha 1: 0.2 g/dL (ref 0.0–0.4)
Alpha2 Glob SerPl Elph-Mcnc: 0.6 g/dL (ref 0.4–1.0)
B-Globulin SerPl Elph-Mcnc: 1 g/dL (ref 0.7–1.3)
Gamma Glob SerPl Elph-Mcnc: 0.7 g/dL (ref 0.4–1.8)
Globulin, Total: 2.5 g/dL (ref 2.2–3.9)
IgA: 222 mg/dL (ref 61–437)
IgG (Immunoglobin G), Serum: 763 mg/dL (ref 603–1613)
IgM (Immunoglobulin M), Srm: 89 mg/dL (ref 15–143)
M Protein SerPl Elph-Mcnc: 0.2 g/dL — ABNORMAL HIGH
Total Protein ELP: 5.8 g/dL — ABNORMAL LOW (ref 6.0–8.5)

## 2019-05-26 ENCOUNTER — Ambulatory Visit: Payer: PPO | Admitting: Radiation Oncology

## 2019-05-28 DIAGNOSIS — Z03818 Encounter for observation for suspected exposure to other biological agents ruled out: Secondary | ICD-10-CM | POA: Diagnosis not present

## 2019-05-28 DIAGNOSIS — J22 Unspecified acute lower respiratory infection: Secondary | ICD-10-CM | POA: Diagnosis not present

## 2019-05-28 DIAGNOSIS — R5383 Other fatigue: Secondary | ICD-10-CM | POA: Diagnosis not present

## 2019-05-28 DIAGNOSIS — Z85118 Personal history of other malignant neoplasm of bronchus and lung: Secondary | ICD-10-CM | POA: Diagnosis not present

## 2019-05-28 DIAGNOSIS — R031 Nonspecific low blood-pressure reading: Secondary | ICD-10-CM | POA: Diagnosis not present

## 2019-05-28 DIAGNOSIS — R05 Cough: Secondary | ICD-10-CM | POA: Diagnosis not present

## 2019-05-28 DIAGNOSIS — R0600 Dyspnea, unspecified: Secondary | ICD-10-CM | POA: Diagnosis not present

## 2019-06-04 ENCOUNTER — Other Ambulatory Visit: Payer: Self-pay | Admitting: *Deleted

## 2019-06-04 MED ORDER — PANTOPRAZOLE SODIUM 40 MG PO TBEC: 40.0000 mg | DELAYED_RELEASE_TABLET | Freq: Every day | ORAL | 0 refills | Status: DC

## 2019-06-06 ENCOUNTER — Other Ambulatory Visit: Payer: Self-pay | Admitting: *Deleted

## 2019-06-06 MED ORDER — BUDESONIDE-FORMOTEROL FUMARATE 160-4.5 MCG/ACT IN AERO: 2.0000 | INHALATION_SPRAY | Freq: Two times a day (BID) | RESPIRATORY_TRACT | 0 refills | Status: DC

## 2019-06-06 NOTE — Telephone Encounter (Signed)
Please refill one month supply and advise patient to get additional refills from her pcp. Thanks.

## 2019-06-09 NOTE — Telephone Encounter (Signed)
Pt's wife has been notified.

## 2019-07-03 ENCOUNTER — Other Ambulatory Visit: Payer: Self-pay | Admitting: *Deleted

## 2019-07-03 NOTE — Telephone Encounter (Signed)
Must contact PCP for future refills, per Dr. [name], note in prescription.

## 2019-07-14 DIAGNOSIS — E039 Hypothyroidism, unspecified: Secondary | ICD-10-CM | POA: Diagnosis not present

## 2019-07-14 DIAGNOSIS — E2749 Other adrenocortical insufficiency: Secondary | ICD-10-CM | POA: Diagnosis not present

## 2019-07-14 DIAGNOSIS — I10 Essential (primary) hypertension: Secondary | ICD-10-CM | POA: Diagnosis not present

## 2019-07-14 DIAGNOSIS — E785 Hyperlipidemia, unspecified: Secondary | ICD-10-CM | POA: Diagnosis not present

## 2019-07-14 DIAGNOSIS — Z125 Encounter for screening for malignant neoplasm of prostate: Secondary | ICD-10-CM | POA: Diagnosis not present

## 2019-07-21 DIAGNOSIS — E2749 Other adrenocortical insufficiency: Secondary | ICD-10-CM | POA: Diagnosis not present

## 2019-07-21 DIAGNOSIS — E039 Hypothyroidism, unspecified: Secondary | ICD-10-CM | POA: Diagnosis not present

## 2019-07-28 ENCOUNTER — Other Ambulatory Visit: Payer: Self-pay | Admitting: *Deleted

## 2019-07-29 ENCOUNTER — Other Ambulatory Visit: Payer: Self-pay

## 2019-07-29 NOTE — Telephone Encounter (Signed)
error 

## 2019-08-04 ENCOUNTER — Other Ambulatory Visit: Payer: Self-pay

## 2019-08-04 ENCOUNTER — Ambulatory Visit
Admission: RE | Admit: 2019-08-04 | Discharge: 2019-08-04 | Disposition: A | Discharge status: Home / Self Care | Payer: PPO | Source: Ambulatory Visit | Attending: Oncology | Admitting: Oncology

## 2019-08-04 DIAGNOSIS — C3490 Malignant neoplasm of unspecified part of unspecified bronchus or lung: Secondary | ICD-10-CM | POA: Diagnosis not present

## 2019-08-04 DIAGNOSIS — C3492 Malignant neoplasm of unspecified part of left bronchus or lung: Secondary | ICD-10-CM | POA: Insufficient documentation

## 2019-08-04 DIAGNOSIS — Z5111 Encounter for antineoplastic chemotherapy: Secondary | ICD-10-CM | POA: Diagnosis not present

## 2019-08-05 ENCOUNTER — Encounter: Payer: Self-pay | Admitting: Radiation Oncology

## 2019-08-05 ENCOUNTER — Other Ambulatory Visit: Payer: Self-pay

## 2019-08-05 DIAGNOSIS — I700 Atherosclerosis of aorta: Secondary | ICD-10-CM | POA: Insufficient documentation

## 2019-08-06 ENCOUNTER — Ambulatory Visit
Admission: RE | Admit: 2019-08-06 | Discharge: 2019-08-06 | Disposition: A | Discharge status: Home / Self Care | Payer: PPO | Source: Ambulatory Visit | Attending: Radiation Oncology | Admitting: Radiation Oncology

## 2019-08-06 ENCOUNTER — Inpatient Hospital Stay (HOSPITAL_BASED_OUTPATIENT_CLINIC_OR_DEPARTMENT_OTHER): Payer: PPO | Admitting: Oncology

## 2019-08-06 ENCOUNTER — Inpatient Hospital Stay: Payer: PPO | Attending: Oncology | Admitting: *Deleted

## 2019-08-06 ENCOUNTER — Other Ambulatory Visit: Payer: Self-pay

## 2019-08-06 ENCOUNTER — Encounter: Payer: Self-pay | Admitting: Oncology

## 2019-08-06 VITALS — BP 132/86 | HR 73 | Temp 98.3°F | Resp 18 | Wt 239.4 lb

## 2019-08-06 DIAGNOSIS — C61 Malignant neoplasm of prostate: Secondary | ICD-10-CM

## 2019-08-06 DIAGNOSIS — Z452 Encounter for adjustment and management of vascular access device: Secondary | ICD-10-CM | POA: Insufficient documentation

## 2019-08-06 DIAGNOSIS — I129 Hypertensive chronic kidney disease with stage 1 through stage 4 chronic kidney disease, or unspecified chronic kidney disease: Secondary | ICD-10-CM | POA: Insufficient documentation

## 2019-08-06 DIAGNOSIS — C3412 Malignant neoplasm of upper lobe, left bronchus or lung: Secondary | ICD-10-CM | POA: Diagnosis not present

## 2019-08-06 DIAGNOSIS — C3492 Malignant neoplasm of unspecified part of left bronchus or lung: Secondary | ICD-10-CM

## 2019-08-06 DIAGNOSIS — Z7951 Long term (current) use of inhaled steroids: Secondary | ICD-10-CM | POA: Insufficient documentation

## 2019-08-06 DIAGNOSIS — E032 Hypothyroidism due to medicaments and other exogenous substances: Secondary | ICD-10-CM | POA: Diagnosis not present

## 2019-08-06 DIAGNOSIS — F17210 Nicotine dependence, cigarettes, uncomplicated: Secondary | ICD-10-CM | POA: Diagnosis not present

## 2019-08-06 DIAGNOSIS — N189 Chronic kidney disease, unspecified: Secondary | ICD-10-CM | POA: Diagnosis not present

## 2019-08-06 DIAGNOSIS — Z79899 Other long term (current) drug therapy: Secondary | ICD-10-CM | POA: Insufficient documentation

## 2019-08-06 DIAGNOSIS — Z95828 Presence of other vascular implants and grafts: Secondary | ICD-10-CM

## 2019-08-06 DIAGNOSIS — K219 Gastro-esophageal reflux disease without esophagitis: Secondary | ICD-10-CM | POA: Insufficient documentation

## 2019-08-06 DIAGNOSIS — E2740 Unspecified adrenocortical insufficiency: Secondary | ICD-10-CM

## 2019-08-06 DIAGNOSIS — N400 Enlarged prostate without lower urinary tract symptoms: Secondary | ICD-10-CM | POA: Insufficient documentation

## 2019-08-06 DIAGNOSIS — Z87891 Personal history of nicotine dependence: Secondary | ICD-10-CM | POA: Insufficient documentation

## 2019-08-06 DIAGNOSIS — J441 Chronic obstructive pulmonary disease with (acute) exacerbation: Secondary | ICD-10-CM

## 2019-08-06 DIAGNOSIS — M1990 Unspecified osteoarthritis, unspecified site: Secondary | ICD-10-CM | POA: Diagnosis not present

## 2019-08-06 DIAGNOSIS — F418 Other specified anxiety disorders: Secondary | ICD-10-CM | POA: Diagnosis not present

## 2019-08-06 DIAGNOSIS — D472 Monoclonal gammopathy: Secondary | ICD-10-CM | POA: Diagnosis not present

## 2019-08-06 LAB — COMPREHENSIVE METABOLIC PANEL
ALT: 23 U/L (ref 0–44)
AST: 20 U/L (ref 15–41)
Albumin: 3.6 g/dL (ref 3.5–5.0)
Alkaline Phosphatase: 60 U/L (ref 38–126)
Anion gap: 9 (ref 5–15)
BUN: 21 mg/dL (ref 8–23)
CO2: 25 mmol/L (ref 22–32)
Calcium: 8.8 mg/dL — ABNORMAL LOW (ref 8.9–10.3)
Chloride: 103 mmol/L (ref 98–111)
Creatinine, Ser: 1.19 mg/dL (ref 0.61–1.24)
GFR calc Af Amer: >60 mL/min (ref >60)
GFR calc non Af Amer: >60 mL/min (ref >60)
Glucose, Bld: 108 mg/dL — ABNORMAL HIGH (ref 70–99)
Potassium: 4.6 mmol/L (ref 3.5–5.1)
Sodium: 137 mmol/L (ref 135–145)
Total Bilirubin: 0.6 mg/dL (ref 0.3–1.2)
Total Protein: 6.6 g/dL (ref 6.5–8.1)

## 2019-08-06 LAB — CBC WITH DIFFERENTIAL/PLATELET
Abs Immature Granulocytes: 0.04 10*3/uL (ref 0.00–0.07)
Basophils Absolute: 0 10*3/uL (ref 0.0–0.1)
Basophils Relative: 0 %
Eosinophils Absolute: 0 10*3/uL (ref 0.0–0.5)
Eosinophils Relative: 0 %
HCT: 42.5 % (ref 39.0–52.0)
Hemoglobin: 13.1 g/dL (ref 13.0–17.0)
Immature Granulocytes: 1 %
Lymphocytes Relative: 13 %
Lymphs Abs: 0.9 10*3/uL (ref 0.7–4.0)
MCH: 28.7 pg (ref 26.0–34.0)
MCHC: 30.8 g/dL (ref 30.0–36.0)
MCV: 93 fL (ref 80.0–100.0)
Monocytes Absolute: 0.6 10*3/uL (ref 0.1–1.0)
Monocytes Relative: 8 %
Neutro Abs: 5.5 10*3/uL (ref 1.7–7.7)
Neutrophils Relative %: 78 %
Platelets: 170 10*3/uL (ref 150–400)
RBC: 4.57 MIL/uL (ref 4.22–5.81)
RDW: 15.9 % — ABNORMAL HIGH (ref 11.5–15.5)
WBC: 7.1 10*3/uL (ref 4.0–10.5)
nRBC: 0 % (ref 0.0–0.2)

## 2019-08-06 MED ORDER — SODIUM CHLORIDE 0.9% FLUSH
10.0000 mL | Freq: Once | INTRAVENOUS | Status: AC
  Administered 2019-08-06: 13:00:00 10 mL via INTRAVENOUS
  Filled 2019-08-06: qty 10

## 2019-08-06 MED ORDER — HEPARIN SOD (PORK) LOCK FLUSH 100 UNIT/ML IV SOLN
500.0000 [IU] | Freq: Once | INTRAVENOUS | Status: AC
  Administered 2019-08-06: 13:00:00 500 [IU] via INTRAVENOUS
  Filled 2019-08-06: qty 5

## 2019-08-06 NOTE — Progress Notes (Signed)
Patient here for follow up. Pt reports having some itching with small bumps to back.

## 2019-08-06 NOTE — Progress Notes (Signed)
Radiation Oncology Follow up Note  Name: Marc Gray   Date:   08/06/2019 MRN:  [id number] DOB: 1946-01-25    This 74 y.o. male presents to the clinic today for 2-year follow-up status post concurrent chemoradiation therapy for stage IIIb adenocarcinoma the left lung.  REFERRING PROVIDER: [name], MD  HPI: Patient is a 74 year old male now out 2 years having completed concurrent chemoradiation therapy for stage IIIb adenocarcinoma the left lung.  He is seen today in routine follow-up and is doing well specifically denies cough hemoptysis or chest tightness..  Most recent CT scan performed this month shows scarring in the medial left hemithorax with bronchial narrowing no evidence of recurrent or metastatic disease.  Does have some narrowing of the right internal jugular vein and superior vena cava with indwelling Port-A-Cath that is obese looked at by vascular surgery.  He also has a stable AAA.  He is currently under treatment with Durvalumab maintenance which she is tolerating well.  Patient also has history of Gleason 6 adenocarcinoma which is being observed.  COMPLICATIONS OF TREATMENT: none  FOLLOW UP COMPLIANCE: keeps appointments   PHYSICAL EXAM:  There were no vitals taken for this visit. Well-developed well-nourished patient in NAD. HEENT reveals PERLA, EOMI, discs not visualized.  Oral cavity is clear. No oral mucosal lesions are identified. Neck is clear without evidence of cervical or supraclavicular adenopathy. Lungs are clear to A&P. Cardiac examination is essentially unremarkable with regular rate and rhythm without murmur rub or thrill. Abdomen is benign with no organomegaly or masses noted. Motor sensory and DTR levels are equal and symmetric in the upper and lower extremities. Cranial nerves II through XII are grossly intact. Proprioception is intact. No peripheral adenopathy or edema is identified. No motor or sensory levels are noted. Crude visual fields  are within normal range.  RADIOLOGY RESULTS: CT scan reviewed compatible with above-stated findings  PLAN: Present time patient continues to do extremely well with no CT evidence for progressive or recurrent disease.  He continues on Durvalumab maintenance.  I am pleased with his overall progress have asked to see him back in 1 year for follow-up.  Patient knows to call at anytime with any concerns.  I would like to take this opportunity to thank you for allowing me to participate in the care of your patient.Noreene Filbert, MD

## 2019-08-06 NOTE — Progress Notes (Signed)
Hematology/Oncology Follow Up Note [facility] [phone] [phone]   Patient Care Team: [name], MD as PCP - General (Family Medicine) [name], [name], MD (General Surgery) [name], MD as Consulting Physician (Oncology) [name], RN as Registered Nurse  Reason for Visit:  Follow up for lung cancer, immunotherapy side effects, adrenal insufficiency.  HISTORY OF PRESENTING ILLNESS:  Marc Gray 74 y.o.  male who presents for treatment of Stage IIIB (cT2b, cN3, cM0)  Adenocarcinoma of lung.  # Patient is s/p [place] and taxol concurrent RT for treatment of Stage IIIB Lung cancer. He recieved additional lung boost RT Currently on Durvalumab maintenance.   # Colon CA was listed in his history, however reviewing his surgical pathology from colonoscopy on 04/15/2014, during which he had polyps removed and all are negative for cancer.patient is scheduled for colonoscopy screening this year.  # 5/1/ 2019 CT scan was discussed at tumor board and changes consistent with radiation changes  # TURP procedure done on 02/14/2019.  Pathology showed prostate adenocarcinoma, Gleason score 3+3, tumor quantitation, estimated percentage of prostate tissue involved by tumor less than 5%. No perineural invasion identified. Patient follows up with urologist and was recommended to observe.  INTERVAL HISTORY Patient he presents for follow-up of fo stage IIIB adenocarcinoma of the lung. Patient patient reports chronic shortness of breath with exertion. Appetite is good. Denies any hemoptysis, chest pain, cough, abdominal pain, unintentional weight loss, fever or chills. Patient is on chronic steroids for adrenal insufficiency.  Patient is on Synthroid for hypothyroidism.    Review of Systems  Constitutional: Negative for appetite change, chills, fatigue, fever and unexpected weight change.  HENT:   Negative for hearing loss and  voice change.   Eyes: Negative for eye problems and icterus.  Respiratory: Positive for shortness of breath. Negative for chest tightness and cough.   Cardiovascular: Negative for chest pain and leg swelling.  Gastrointestinal: Negative for abdominal distention, abdominal pain and blood in stool.  Endocrine: Negative for hot flashes.  Genitourinary: Negative for difficulty urinating, dysuria and frequency.   Musculoskeletal: Negative for arthralgias and back pain.  Skin: Negative for itching and rash.  Neurological: Negative for extremity weakness, light-headedness and numbness.  Hematological: Negative for adenopathy. Does not bruise/bleed easily.  Psychiatric/Behavioral: Negative for confusion.    MEDICAL HISTORY:  Past Medical History:  Diagnosis Date  . Anxiety   . Arthritis    SHOULDER  . Cataract   . Chronic kidney disease    low kidney function  . Colon cancer ([place]) 2015   Pt states during his colonoscopy he had cancerous polyps removed.   [name] COPD (chronic obstructive pulmonary disease) ([place])   . Depression    SINCE DIAGNOSIS  . Dyspnea    DOE  . Enlarged prostate   . GERD (gastroesophageal reflux disease)   . Hypertension   . Hypothyroidism   . Lung cancer [facility])    Aug 2018  . Pain    DUE TO LUNG ISSUE    SURGICAL HISTORY: Past Surgical History:  Procedure Laterality Date  . BACK SURGERY     kyphoplasty  T5-6  . CATARACT EXTRACTION W/ INTRAOCULAR LENS  IMPLANT, BILATERAL    . COLONOSCOPY    . EYE SURGERY Bilateral    cataract  . LYMPH GLAND EXCISION N/A 02/09/2017   Procedure: CERVICAL LYMPH NODE BIOPSY;  Surgeon: Robert Bellow, MD;  Location: [facility] ORS;  Service: General;  Laterality: N/A;  .  PORTACATH PLACEMENT Right 02/23/2017   Procedure: INSERTION PORT-A-CATH;  Surgeon: Robert Bellow, MD;  Location: [facility] ORS;  Service: General;  Laterality: Right;  . PROSTATE SURGERY    . TONSILLECTOMY    . TRANSURETHRAL RESECTION OF PROSTATE N/A 02/14/2019    Procedure: TRANSURETHRAL RESECTION OF THE PROSTATE (TURP);  Surgeon: [name], MD;  Location: [facility] ORS;  Service: Urology;  Laterality: N/A;    SOCIAL HISTORY: Social History   Socioeconomic History  . Marital status: Married    Spouse name: Not on file  . Number of children: Not on file  . Years of education: Not on file  . Highest education level: Not on file  Occupational History  . Not on file  Tobacco Use  . Smoking status: Former Smoker    Packs/day: 1.00    Years: 53.00    Pack years: 53.00    Types: Cigarettes    Quit date: 04/14/2017    Years since quitting: 2.3  . Smokeless tobacco: Never Used  . Tobacco comment: Quit November 2018  Substance and Sexual Activity  . Alcohol use: Yes    Comment: occassional  . Drug use: No  . Sexual activity: Yes  Other Topics Concern  . Not on file  Social History Narrative  . Not on file   Social Determinants of Health   Financial Resource Strain:   . Difficulty of Paying Living Expenses: Not on file  Food Insecurity:   . Worried About Charity fundraiser in the Last Year: Not on file  . Ran Out of Food in the Last Year: Not on file  Transportation Needs:   . Lack of Transportation (Medical): Not on file  . Lack of Transportation (Non-Medical): Not on file  Physical Activity:   . Days of Exercise per Week: Not on file  . Minutes of Exercise per Session: Not on file  Stress:   . Feeling of Stress : Not on file  Social Connections:   . Frequency of Communication with Friends and Family: Not on file  . Frequency of Social Gatherings with Friends and Family: Not on file  . Attends Religious Services: Not on file  . Active Member of Clubs or Organizations: Not on file  . Attends Archivist Meetings: Not on file  . Marital Status: Not on file  Intimate Partner Violence:   . Fear of Current or Ex-Partner: Not on file  . Emotionally Abused: Not on file  . Physically Abused: Not on file  . Sexually  Abused: Not on file    FAMILY HISTORY: Family History  Problem Relation Age of Onset  . Breast cancer Maternal Grandmother   . Dementia Mother   . Diabetes Father   . Stroke Father     ALLERGIES:  has No Known Allergies.  MEDICATIONS:  Current Outpatient Medications  Medication Sig Dispense Refill  . acetaminophen (TYLENOL) 500 MG tablet Take 1,000 mg by mouth every 6 (six) hours as needed for moderate pain.    [name] aspirin 325 MG tablet Take 650 mg by mouth daily.     . budesonide-formoterol (SYMBICORT) 160-4.5 MCG/ACT inhaler Inhale 2 puffs into the lungs 2 (two) times daily. Must contact PCP for future refills 1 Inhaler 0  . calcium-vitamin D (OSCAL WITH D) 500-200 MG-UNIT tablet Take 1 tablet by mouth daily. 90 tablet 1  . Chlorpheniramine-Phenylephrine (SINUS & ALLERGY) 4-10 MG tablet Take 1 tablet by mouth every 6 (six) hours as needed for allergies.     [name]  diphenhydrAMINE-zinc acetate (BENADRYL EXTRA STRENGTH) cream Apply 1 application topically 3 (three) times daily as needed for itching. 28 g 0  . fluticasone (FLONASE) 50 MCG/ACT nasal spray Place 1 spray into both nostrils daily. 16 g 2  . levothyroxine (SYNTHROID) 150 MCG tablet Take 150 mcg by mouth daily before breakfast.    . lidocaine-prilocaine (EMLA) cream Apply over port 1-2 hours prior to chemotherapy treatment.  Cover with plastic wrap. 30 g 1  . Melatonin 10 MG TABS Take 10 mg by mouth at bedtime.     . Multiple Vitamin (MULTI-VITAMINS) TABS Take 1 tablet by mouth daily.     . ondansetron (ZOFRAN) 8 MG tablet Take 1 tablet (8 mg total) by mouth every 12 (twelve) hours as needed for nausea or vomiting. 60 tablet 0  . pantoprazole (PROTONIX) 40 MG tablet Take 1 tablet (40 mg total) by mouth daily. 90 tablet 0  . predniSONE (DELTASONE) 10 MG tablet Take one tablet each morning. Take one-half tablet each afternoon (take between 2 - 5 PM) (Patient taking differently: Take 5-10 mg by mouth See admin instructions. Take 10 mg  by mouth in the morning and 5 mg in the late afternoon (between 2 - 5 PM)) 45 tablet 0  . PROAIR HFA 108 (90 Base) MCG/ACT inhaler INHALE 2 PUFFS BY MOUTH EVERY 6 HOURS AS NEEDED (Patient taking differently: Inhale 2 puffs into the lungs every 6 (six) hours as needed for wheezing or shortness of breath. ) 8.5 g 0  . prochlorperazine (COMPAZINE) 10 MG tablet Take 1 tablet (10 mg total) by mouth every 6 (six) hours as needed for nausea or vomiting. 60 tablet 2  . sertraline (ZOLOFT) 25 MG tablet TAKE 1 TABLET(25 MG) BY MOUTH DAILY 90 tablet 0  . tadalafil (CIALIS) 5 MG tablet Take 1 tablet (5 mg total) by mouth daily as needed for erectile dysfunction. 30 tablet 11  . tamsulosin (FLOMAX) 0.4 MG CAPS capsule Take 1 capsule (0.4 mg total) by mouth daily. 90 capsule 1  . tiotropium (SPIRIVA HANDIHALER) 18 MCG inhalation capsule INHALE 1 CAPSULE DAILY (Patient taking differently: Place 18 mcg into inhaler and inhale daily. ) 30 capsule 0  . traMADol (ULTRAM) 50 MG tablet Take 1 tablet (50 mg total) by mouth every 6 (six) hours as needed for moderate pain or severe pain. 30 tablet 0  . triamcinolone ointment (KENALOG) 0.5 % Apply 1 application topically 3 (three) times daily as needed. 30 g 1   No current facility-administered medications for this visit.   Facility-Administered Medications Ordered in Other Visits  Medication Dose Route Frequency Provider Last Rate Last Admin  . sodium chloride flush (NS) 0.9 % injection 10 mL  10 mL Intravenous PRN [name], MD   10 mL at 04/03/17 4128      .  PHYSICAL EXAMINATION: ECOG PERFORMANCE STATUS: 1 - Symptomatic but completely ambulatory Vitals:   08/06/19 1318  BP: 132/86  Pulse: 73  Resp: 18  Temp: 98.3 F (36.8 C)  SpO2: 98%   Filed Weights   08/06/19 1318  Weight: 239 lb 6.4 oz (108.6 kg)  Physical Exam  Constitutional: He is oriented to person, place, and time. No distress.  HENT:  Head: Normocephalic and atraumatic.  Nose: Nose normal.    Mouth/Throat: Oropharynx is clear and moist. No oropharyngeal exudate.  Eyes: Pupils are equal, round, and reactive to light. EOM are normal. No scleral icterus.  Cardiovascular: Normal rate and regular rhythm.  No murmur heard.  Pulmonary/Chest: Effort normal. No respiratory distress.  Decreased breath sound bilaterally  Abdominal: Soft. He exhibits no distension. There is no abdominal tenderness.  Musculoskeletal:        General: No edema. Normal range of motion.     Cervical back: Normal range of motion and neck supple.  Neurological: He is alert and oriented to person, place, and time.  Skin: Skin is warm and dry. He is not diaphoretic. No erythema.  Psychiatric: Affect normal.   LABORATORY DATA:  I have reviewed the data as listed Lab Results  Component Value Date   WBC 7.1 08/06/2019   HGB 13.1 08/06/2019   HCT 42.5 08/06/2019   MCV 93.0 08/06/2019   PLT 170 08/06/2019   Recent Labs    02/07/19 0952 05/09/19 1349 08/06/19 1304  NA 140 139 137  K 4.2 3.8 4.6  CL 105 105 103  CO2 27 26 25   GLUCOSE 107* 97 108*  BUN 25* 22 21  CREATININE 1.35* 1.32* 1.19  CALCIUM 8.9 8.6* 8.8*  GFRNONAA 52* 54* >60  GFRAA >60 >60 >60  PROT 6.2* 6.4* 6.6  ALBUMIN 3.6 3.4* 3.6  AST 18 17 20   ALT 24 23 23   ALKPHOS 62 61 60  BILITOT 0.5 0.7 0.6  RADIOGRAPHIC STUDIES: I have personally reviewed the radiological images as listed and agreed with the findings in the report. CT Chest Wo Contrast  Result Date: 08/04/2019 CLINICAL DATA:  Lung cancer, previous chemotherapy radiation therapy and immunotherapy. EXAM: CT CHEST WITHOUT CONTRAST TECHNIQUE: Multidetector CT imaging of the chest was performed following the standard protocol without IV contrast. COMPARISON:  03/07/2019. FINDINGS: Cardiovascular: Right IJ Port-A-Cath tip versus a narrowed right internal jugular vein and superior vena cava, terminating in the mid SVC. Atherosclerotic calcification of the aorta and coronary arteries.  Ascending aorta measures 4.1 cm, stable. Pulmonic trunk is enlarged. Heart size normal. No pericardial effusion. Mediastinum/Nodes: No pathologically enlarged mediastinal or axillary lymph nodes. Hilar regions are difficult to evaluate without IV contrast. There is soft tissue thickening in the left hilum, similar to the prior exam. Esophagus is grossly unremarkable. Lungs/Pleura: Centrilobular emphysema. Soft tissue thickening in the medial aspect of the left upper and left lower lobes is similar to 05/07/2019 and presumably treatment related. There is slight associated left upper and left lower lobe bronchial narrowing, also similar. Subpleural scarring in the medial aspect of the posterior right upper lobe (3/38). Pulmonary nodules measure up to 4 mm in the anterior right lower lobe (3/85), unchanged. No pleural fluid. Airway is otherwise unremarkable. Upper Abdomen: Visualized portions of the liver, gallbladder, adrenal glands and right kidney are unremarkable. Left kidney is atrophic. Spleen and visualized portions of pancreas, stomach and bowel are unremarkable. No upper abdominal adenopathy. Musculoskeletal: Thoracic vertebral body augmentations. Degenerative changes in the spine. No worrisome lytic or sclerotic lesions. T2 compression fracture is unchanged. There may be slight compression of the T4 superior endplate is well, also unchanged. IMPRESSION: 1. Post treatment scarring in the medial left hemithorax with associated bronchial narrowing. No evidence of recurrent or metastatic disease. 2. Narrowing of the low right internal jugular vein and superior vena cava with indwelling Port-A-Cath. 3. Ascending Aortic aneurysm NOS (ICD10-I71.9), stable. 4. Aortic atherosclerosis (ICD10-I70.0). Coronary artery calcification. 5. Enlarged pulmonic trunk, indicative of pulmonary arterial hypertension. 6.  Emphysema (ICD10-J43.9). Electronically Signed   By: Lorin Picket M.D.   On: 08/04/2019 11:05   ASSESSMENT &  PLAN:  Cancer Staging Adenocarcinoma of left lung, stage 3 (  [place]) Staging form: Lung, AJCC 8th Edition - Clinical stage from 02/14/2017: Stage IIIB (cT2b, cN3, cM0) - Signed by [name], MD on 02/14/2017  1. Adenocarcinoma of left lung, stage 3 ([place])   2. COPD exacerbation ([place])   3. MGUS (monoclonal gammopathy of unknown significance)   4. Port-A-Cath in place   5. Adrenal insufficiency ([place])   6. Hypothyroidism due to medication   7. Prostate cancer ([place])    #Stage IIIB lung adenocarcinoma, s/p concurrent chemo/RT and immunotherapy, finished all treatment in January 2020 Interval CT scan was independently reviewed by me and discussed with patient. No evidence of progressive or recurrent disease. Continue CT surveillance every 3 months.  #Shortness of breath with exertion, COPD/emphysema CT chest also showed possible pulmonary hypertension as well as bronchial narrowing secondary to radiation. Refer patient to pulmonology for further evaluation.  #Hypothyroidism and adrenal insufficiency.  Continue follow-up with Dr. Sharrie Rothman.  #Weight gain, secondary to chronic steroid use.  Lifestyle modification discussed with patient.  # MGUS,  multiple myeloma panel is stable.  Continue monitor every 6 months.. #CKD, avoid NSAIDs. #Prostate adenocarcinoma, Gleason score 6, currently on observation.  Followed up by urology. #Port-A-Cath in place, continue port flush every 6 to 8 weeks. CT showed narrowing of the low right internal jugular vein and superior vena cava with indwelling Port-A-Cath. I sent inbasket message to Dr. [name] for discussion.    Repeat CT scan in 3 months and follow-up in the clinic. Orders Placed This Encounter  Procedures  . CT Chest Wo Contrast    Standing Status:   Future    Standing Expiration Date:   08/05/2020    Order Specific Question:   ** REASON FOR EXAM (FREE TEXT)    Answer:   lung cancer follow up    Order Specific Question:   Preferred imaging location?     Answer:   [facility]    Order Specific Question:   Call Results- Best Contact Number?    Answer:   [phone]    Order Specific Question:   Radiology Contrast Protocol - do NOT remove file path    Answer:   [email or web address]  . Ambulatory referral to Pulmonology    Referral Priority:   Routine    Referral Type:   Consultation    Referral Reason:   Specialty Services Required    Referred to Provider:   Ottie Glazier, MD    Requested Specialty:   Pulmonary Disease    Number of Visits Requested:   1   Follow up in 3 months, repeat CT chest surveillance scan.   [name], MD, PhD  08/06/2019

## 2019-08-12 DIAGNOSIS — E039 Hypothyroidism, unspecified: Secondary | ICD-10-CM | POA: Diagnosis not present

## 2019-08-12 DIAGNOSIS — H6123 Impacted cerumen, bilateral: Secondary | ICD-10-CM | POA: Diagnosis not present

## 2019-08-12 DIAGNOSIS — R0602 Shortness of breath: Secondary | ICD-10-CM | POA: Diagnosis not present

## 2019-08-12 DIAGNOSIS — Z1211 Encounter for screening for malignant neoplasm of colon: Secondary | ICD-10-CM | POA: Diagnosis not present

## 2019-08-21 DIAGNOSIS — J9809 Other diseases of bronchus, not elsewhere classified: Secondary | ICD-10-CM | POA: Diagnosis not present

## 2019-08-21 DIAGNOSIS — Z1331 Encounter for screening for depression: Secondary | ICD-10-CM | POA: Diagnosis not present

## 2019-08-22 DIAGNOSIS — J9809 Other diseases of bronchus, not elsewhere classified: Secondary | ICD-10-CM | POA: Diagnosis not present

## 2019-08-22 DIAGNOSIS — J439 Emphysema, unspecified: Secondary | ICD-10-CM | POA: Diagnosis not present

## 2019-09-01 ENCOUNTER — Other Ambulatory Visit: Payer: Self-pay

## 2019-09-01 ENCOUNTER — Other Ambulatory Visit: Payer: PPO

## 2019-09-01 DIAGNOSIS — N401 Enlarged prostate with lower urinary tract symptoms: Secondary | ICD-10-CM | POA: Diagnosis not present

## 2019-09-01 DIAGNOSIS — C61 Malignant neoplasm of prostate: Secondary | ICD-10-CM | POA: Diagnosis not present

## 2019-09-01 DIAGNOSIS — R3914 Feeling of incomplete bladder emptying: Secondary | ICD-10-CM | POA: Diagnosis not present

## 2019-09-02 LAB — PSA: Prostate Specific Ag, Serum: 1.5 ng/mL (ref 0.0–4.0)

## 2019-09-04 ENCOUNTER — Ambulatory Visit: Payer: Self-pay | Admitting: Urology

## 2019-09-15 DIAGNOSIS — H6122 Impacted cerumen, left ear: Secondary | ICD-10-CM | POA: Diagnosis not present

## 2019-09-15 DIAGNOSIS — J301 Allergic rhinitis due to pollen: Secondary | ICD-10-CM | POA: Diagnosis not present

## 2019-09-15 DIAGNOSIS — H903 Sensorineural hearing loss, bilateral: Secondary | ICD-10-CM | POA: Diagnosis not present

## 2019-09-17 ENCOUNTER — Inpatient Hospital Stay: Payer: PPO | Attending: Oncology

## 2019-09-17 ENCOUNTER — Other Ambulatory Visit: Payer: Self-pay

## 2019-09-17 DIAGNOSIS — Z9221 Personal history of antineoplastic chemotherapy: Secondary | ICD-10-CM | POA: Diagnosis not present

## 2019-09-17 DIAGNOSIS — C770 Secondary and unspecified malignant neoplasm of lymph nodes of head, face and neck: Secondary | ICD-10-CM | POA: Insufficient documentation

## 2019-09-17 DIAGNOSIS — Z452 Encounter for adjustment and management of vascular access device: Secondary | ICD-10-CM | POA: Insufficient documentation

## 2019-09-17 DIAGNOSIS — Z923 Personal history of irradiation: Secondary | ICD-10-CM | POA: Insufficient documentation

## 2019-09-17 DIAGNOSIS — C3412 Malignant neoplasm of upper lobe, left bronchus or lung: Secondary | ICD-10-CM | POA: Diagnosis not present

## 2019-09-17 DIAGNOSIS — Z95828 Presence of other vascular implants and grafts: Secondary | ICD-10-CM

## 2019-09-17 MED ORDER — SODIUM CHLORIDE 0.9% FLUSH
10.0000 mL | Freq: Once | INTRAVENOUS | Status: AC
  Administered 2019-09-17: 15:00:00 10 mL via INTRAVENOUS
  Filled 2019-09-17: qty 10

## 2019-09-17 MED ORDER — HEPARIN SOD (PORK) LOCK FLUSH 100 UNIT/ML IV SOLN
INTRAVENOUS | Status: AC
  Filled 2019-09-17: qty 5

## 2019-09-17 MED ORDER — HEPARIN SOD (PORK) LOCK FLUSH 100 UNIT/ML IV SOLN
500.0000 [IU] | Freq: Once | INTRAVENOUS | Status: AC
  Administered 2019-09-17: 500 [IU] via INTRAVENOUS
  Filled 2019-09-17: qty 5

## 2019-09-22 DIAGNOSIS — J439 Emphysema, unspecified: Secondary | ICD-10-CM | POA: Diagnosis not present

## 2019-09-25 DIAGNOSIS — Z03818 Encounter for observation for suspected exposure to other biological agents ruled out: Secondary | ICD-10-CM | POA: Diagnosis not present

## 2019-09-25 DIAGNOSIS — R5383 Other fatigue: Secondary | ICD-10-CM | POA: Diagnosis not present

## 2019-09-25 DIAGNOSIS — N390 Urinary tract infection, site not specified: Secondary | ICD-10-CM | POA: Diagnosis not present

## 2019-09-25 DIAGNOSIS — R05 Cough: Secondary | ICD-10-CM | POA: Diagnosis not present

## 2019-10-01 DIAGNOSIS — C3492 Malignant neoplasm of unspecified part of left bronchus or lung: Secondary | ICD-10-CM | POA: Diagnosis not present

## 2019-10-01 DIAGNOSIS — Z87440 Personal history of urinary (tract) infections: Secondary | ICD-10-CM | POA: Diagnosis not present

## 2019-10-01 DIAGNOSIS — R5383 Other fatigue: Secondary | ICD-10-CM | POA: Diagnosis not present

## 2019-10-01 DIAGNOSIS — E039 Hypothyroidism, unspecified: Secondary | ICD-10-CM | POA: Diagnosis not present

## 2019-10-01 DIAGNOSIS — R5381 Other malaise: Secondary | ICD-10-CM | POA: Diagnosis not present

## 2019-10-01 DIAGNOSIS — J449 Chronic obstructive pulmonary disease, unspecified: Secondary | ICD-10-CM | POA: Diagnosis not present

## 2019-10-06 DIAGNOSIS — Z86010 Personal history of colonic polyps: Secondary | ICD-10-CM | POA: Diagnosis not present

## 2019-10-06 DIAGNOSIS — C3492 Malignant neoplasm of unspecified part of left bronchus or lung: Secondary | ICD-10-CM | POA: Diagnosis not present

## 2019-10-06 DIAGNOSIS — E2740 Unspecified adrenocortical insufficiency: Secondary | ICD-10-CM | POA: Diagnosis not present

## 2019-10-06 DIAGNOSIS — I700 Atherosclerosis of aorta: Secondary | ICD-10-CM | POA: Diagnosis not present

## 2019-10-07 DIAGNOSIS — H903 Sensorineural hearing loss, bilateral: Secondary | ICD-10-CM | POA: Diagnosis not present

## 2019-10-08 ENCOUNTER — Other Ambulatory Visit: Payer: Self-pay

## 2019-10-08 ENCOUNTER — Ambulatory Visit: Payer: PPO | Admitting: Urology

## 2019-10-08 VITALS — BP 132/86 | HR 79 | Ht 73.0 in | Wt 240.0 lb

## 2019-10-08 DIAGNOSIS — R3914 Feeling of incomplete bladder emptying: Secondary | ICD-10-CM

## 2019-10-08 DIAGNOSIS — N401 Enlarged prostate with lower urinary tract symptoms: Secondary | ICD-10-CM | POA: Diagnosis not present

## 2019-10-08 DIAGNOSIS — C61 Malignant neoplasm of prostate: Secondary | ICD-10-CM

## 2019-10-08 LAB — BLADDER SCAN AMB NON-IMAGING: Scan Result: 118

## 2019-10-08 MED ORDER — TADALAFIL 5 MG PO TABS: 5.0000 mg | ORAL_TABLET | Freq: Every day | ORAL | 11 refills | Status: DC | PRN

## 2019-10-08 NOTE — Addendum Note (Signed)
Addended by: Evelina Bucy on: 10/08/2019 09:34 AM   Modules accepted: Orders

## 2019-10-08 NOTE — Progress Notes (Signed)
   10/08/2019 9:24 AM   Marc Gray 1945/07/21 [id number]  Reason for visit: Follow up BPH, low risk PCa  HPI: I saw Marc Gray back in urology clinic for follow-up of BPH and a low risk prostate cancer.  He is a 74 year old male with lung cancer with history of urinary retention and bilateral hydroureteronephrosis in 2013 treated with a TURP by Dr. Jacqlyn Larsen who I saw in the fall 2020 for urinary symptoms and was noted to have significantly elevated PVRs greater than 700 mL.  Cystoscopy showed some residual obstructive anterior prostate tissue and a massively distended bladder, and urodynamics showed high pressure voiding with bladder outlet obstruction.  He has had a known atrophic left kidney on recent renal ultrasound, and renal function has been improving since he underwent TURP with me.  He underwent a TURP with me on 02/14/2019, and tissue did show <5% involvement of low risk prostate cancer with 3+3 equal 6 disease.  PSA postop has is stable at 1.5 from 1.4.  Since surgery, has been doing very well.  He is emptying his bladder with low PVRs, and PVR still low today at 118 mL.  He denies any urinary complaints or voiding problems.  His IPSS score is 2, with quality of life pleased.  He was told he had a UTI a few weeks ago, however urine culture ultimately showed no growth.  Plan to continue active surveillance for his low risk prostate cancer incidentally found at time of TURP with yearly PSA.  We discussed the very low risk of progression or problems from this in the future.  We discussed return precautions at length including gross hematuria, UTIs, worsening renal function, or incontinence.  RTC 1 year with PSA prior and PVR  [name], MD  [facility] [address] [place], [place] [address] [phone]

## 2019-10-08 NOTE — Patient Instructions (Signed)

## 2019-10-13 DIAGNOSIS — H903 Sensorineural hearing loss, bilateral: Secondary | ICD-10-CM | POA: Diagnosis not present

## 2019-10-22 DIAGNOSIS — J439 Emphysema, unspecified: Secondary | ICD-10-CM | POA: Diagnosis not present

## 2019-11-03 ENCOUNTER — Other Ambulatory Visit: Payer: Self-pay

## 2019-11-03 DIAGNOSIS — C3492 Malignant neoplasm of unspecified part of left bronchus or lung: Secondary | ICD-10-CM

## 2019-11-04 ENCOUNTER — Other Ambulatory Visit: Payer: Self-pay

## 2019-11-04 ENCOUNTER — Ambulatory Visit
Admission: RE | Admit: 2019-11-04 | Discharge: 2019-11-04 | Disposition: A | Discharge status: Home / Self Care | Payer: PPO | Source: Ambulatory Visit | Attending: Oncology | Admitting: Oncology

## 2019-11-04 DIAGNOSIS — C3492 Malignant neoplasm of unspecified part of left bronchus or lung: Secondary | ICD-10-CM

## 2019-11-06 ENCOUNTER — Other Ambulatory Visit: Payer: Self-pay

## 2019-11-06 DIAGNOSIS — C3492 Malignant neoplasm of unspecified part of left bronchus or lung: Secondary | ICD-10-CM

## 2019-11-07 ENCOUNTER — Encounter: Payer: Self-pay | Admitting: Oncology

## 2019-11-07 ENCOUNTER — Other Ambulatory Visit: Payer: Self-pay

## 2019-11-07 ENCOUNTER — Inpatient Hospital Stay (HOSPITAL_BASED_OUTPATIENT_CLINIC_OR_DEPARTMENT_OTHER): Payer: PPO | Admitting: Oncology

## 2019-11-07 ENCOUNTER — Inpatient Hospital Stay: Payer: PPO | Attending: Oncology

## 2019-11-07 VITALS — BP 131/76 | HR 78 | Temp 98.1°F | Wt 243.5 lb

## 2019-11-07 DIAGNOSIS — Z95828 Presence of other vascular implants and grafts: Secondary | ICD-10-CM

## 2019-11-07 DIAGNOSIS — F418 Other specified anxiety disorders: Secondary | ICD-10-CM | POA: Diagnosis not present

## 2019-11-07 DIAGNOSIS — E2740 Unspecified adrenocortical insufficiency: Secondary | ICD-10-CM

## 2019-11-07 DIAGNOSIS — E032 Hypothyroidism due to medicaments and other exogenous substances: Secondary | ICD-10-CM | POA: Diagnosis not present

## 2019-11-07 DIAGNOSIS — Z7951 Long term (current) use of inhaled steroids: Secondary | ICD-10-CM | POA: Diagnosis not present

## 2019-11-07 DIAGNOSIS — I129 Hypertensive chronic kidney disease with stage 1 through stage 4 chronic kidney disease, or unspecified chronic kidney disease: Secondary | ICD-10-CM | POA: Diagnosis not present

## 2019-11-07 DIAGNOSIS — Z79899 Other long term (current) drug therapy: Secondary | ICD-10-CM | POA: Diagnosis not present

## 2019-11-07 DIAGNOSIS — M4854XA Collapsed vertebra, not elsewhere classified, thoracic region, initial encounter for fracture: Secondary | ICD-10-CM | POA: Diagnosis not present

## 2019-11-07 DIAGNOSIS — D472 Monoclonal gammopathy: Secondary | ICD-10-CM

## 2019-11-07 DIAGNOSIS — J449 Chronic obstructive pulmonary disease, unspecified: Secondary | ICD-10-CM | POA: Insufficient documentation

## 2019-11-07 DIAGNOSIS — R635 Abnormal weight gain: Secondary | ICD-10-CM | POA: Insufficient documentation

## 2019-11-07 DIAGNOSIS — T380X5A Adverse effect of glucocorticoids and synthetic analogues, initial encounter: Secondary | ICD-10-CM | POA: Insufficient documentation

## 2019-11-07 DIAGNOSIS — Z7952 Long term (current) use of systemic steroids: Secondary | ICD-10-CM | POA: Diagnosis not present

## 2019-11-07 DIAGNOSIS — S22000A Wedge compression fracture of unspecified thoracic vertebra, initial encounter for closed fracture: Secondary | ICD-10-CM | POA: Diagnosis not present

## 2019-11-07 DIAGNOSIS — M1990 Unspecified osteoarthritis, unspecified site: Secondary | ICD-10-CM | POA: Diagnosis not present

## 2019-11-07 DIAGNOSIS — T50905A Adverse effect of unspecified drugs, medicaments and biological substances, initial encounter: Secondary | ICD-10-CM | POA: Diagnosis not present

## 2019-11-07 DIAGNOSIS — N189 Chronic kidney disease, unspecified: Secondary | ICD-10-CM | POA: Diagnosis not present

## 2019-11-07 DIAGNOSIS — Z7982 Long term (current) use of aspirin: Secondary | ICD-10-CM | POA: Diagnosis not present

## 2019-11-07 DIAGNOSIS — Z9225 Personal history of immunosupression therapy: Secondary | ICD-10-CM | POA: Insufficient documentation

## 2019-11-07 DIAGNOSIS — Z9221 Personal history of antineoplastic chemotherapy: Secondary | ICD-10-CM | POA: Insufficient documentation

## 2019-11-07 DIAGNOSIS — C61 Malignant neoplasm of prostate: Secondary | ICD-10-CM | POA: Diagnosis not present

## 2019-11-07 DIAGNOSIS — Z87891 Personal history of nicotine dependence: Secondary | ICD-10-CM | POA: Diagnosis not present

## 2019-11-07 DIAGNOSIS — C3492 Malignant neoplasm of unspecified part of left bronchus or lung: Secondary | ICD-10-CM | POA: Diagnosis not present

## 2019-11-07 LAB — CBC WITH DIFFERENTIAL/PLATELET
Abs Immature Granulocytes: 0.08 10*3/uL — ABNORMAL HIGH (ref 0.00–0.07)
Basophils Absolute: 0.1 10*3/uL (ref 0.0–0.1)
Basophils Relative: 1 %
Eosinophils Absolute: 0 10*3/uL (ref 0.0–0.5)
Eosinophils Relative: 1 %
HCT: 40.3 % (ref 39.0–52.0)
Hemoglobin: 13.2 g/dL (ref 13.0–17.0)
Immature Granulocytes: 1 %
Lymphocytes Relative: 14 %
Lymphs Abs: 1 10*3/uL (ref 0.7–4.0)
MCH: 30.8 pg (ref 26.0–34.0)
MCHC: 32.8 g/dL (ref 30.0–36.0)
MCV: 93.9 fL (ref 80.0–100.0)
Monocytes Absolute: 0.7 10*3/uL (ref 0.1–1.0)
Monocytes Relative: 10 %
Neutro Abs: 5 10*3/uL (ref 1.7–7.7)
Neutrophils Relative %: 73 %
Platelets: 169 10*3/uL (ref 150–400)
RBC: 4.29 MIL/uL (ref 4.22–5.81)
RDW: 16.2 % — ABNORMAL HIGH (ref 11.5–15.5)
WBC: 6.8 10*3/uL (ref 4.0–10.5)
nRBC: 0 % (ref 0.0–0.2)

## 2019-11-07 LAB — COMPREHENSIVE METABOLIC PANEL
ALT: 21 U/L (ref 0–44)
AST: 18 U/L (ref 15–41)
Albumin: 3.7 g/dL (ref 3.5–5.0)
Alkaline Phosphatase: 52 U/L (ref 38–126)
Anion gap: 9 (ref 5–15)
BUN: 24 mg/dL — ABNORMAL HIGH (ref 8–23)
CO2: 25 mmol/L (ref 22–32)
Calcium: 8.8 mg/dL — ABNORMAL LOW (ref 8.9–10.3)
Chloride: 106 mmol/L (ref 98–111)
Creatinine, Ser: 1.36 mg/dL — ABNORMAL HIGH (ref 0.61–1.24)
GFR calc Af Amer: 60 mL/min — ABNORMAL LOW (ref >60)
GFR calc non Af Amer: 52 mL/min — ABNORMAL LOW (ref >60)
Glucose, Bld: 115 mg/dL — ABNORMAL HIGH (ref 70–99)
Potassium: 4.1 mmol/L (ref 3.5–5.1)
Sodium: 140 mmol/L (ref 135–145)
Total Bilirubin: 0.7 mg/dL (ref 0.3–1.2)
Total Protein: 6.8 g/dL (ref 6.5–8.1)

## 2019-11-07 MED ORDER — LIDOCAINE-PRILOCAINE 2.5-2.5 % EX CREA: TOPICAL_CREAM | CUTANEOUS | 1 refills | Status: DC

## 2019-11-07 MED ORDER — HEPARIN SOD (PORK) LOCK FLUSH 100 UNIT/ML IV SOLN
500.0000 [IU] | Freq: Once | INTRAVENOUS | Status: AC
  Administered 2019-11-07: 500 [IU] via INTRAVENOUS
  Filled 2019-11-07: qty 5

## 2019-11-07 MED ORDER — SODIUM CHLORIDE 0.9% FLUSH
10.0000 mL | INTRAVENOUS | Status: DC | PRN
  Administered 2019-11-07: 10 mL via INTRAVENOUS
  Filled 2019-11-07: qty 10

## 2019-11-07 MED ORDER — HEPARIN SOD (PORK) LOCK FLUSH 100 UNIT/ML IV SOLN
INTRAVENOUS | Status: AC
  Filled 2019-11-07: qty 5

## 2019-11-07 NOTE — Progress Notes (Signed)
Patient reports increase in SOBr that is being worked up by pulmonologist.  Does have constipation.  Episodes Having low back and left shoulder area pain.

## 2019-11-07 NOTE — Progress Notes (Signed)
Hematology/Oncology Follow Up Note [facility] [phone] [phone]   Patient Care Team: [name], MD as PCP - General (Family Medicine) [name], [name], MD (General Surgery) [name], MD as Consulting Physician (Oncology) [name], RN as Registered Nurse  Reason for Visit:  Follow up for lung cancer, immunotherapy side effects, adrenal insufficiency.  HISTORY OF PRESENTING ILLNESS:  Marc Gray 74 y.o.  male who presents for treatment of Stage IIIB (cT2b, cN3, cM0)  Adenocarcinoma of lung.  # Patient is s/p [place] and taxol concurrent RT for treatment of Stage IIIB Lung cancer. He recieved additional lung boost RT finished one year of Durvalumab -January 2020  # Colon CA was listed in his history, however reviewing his surgical pathology from colonoscopy on 04/15/2014, during which he had polyps removed and all are negative for cancer.patient is scheduled for colonoscopy screening this year.  # 5/1/ 2019 CT scan was discussed at tumor board and changes consistent with radiation changes  # TURP procedure done on 02/14/2019.  Pathology showed prostate adenocarcinoma, Gleason score 3+3, tumor quantitation, estimated percentage of prostate tissue involved by tumor less than 5%. No perineural invasion identified. Patient follows up with urologist and was recommended to observe.  INTERVAL HISTORY Patient he presents for follow-up of fo stage IIIB adenocarcinoma of the lung. Chronic SOB with exertion. Follows up with pulmonology.  Appetite is good. He gained more weight since last visit.  Denies weight loss, fever, chills, fatigue, night sweats.  On Steroid for adrenal insufficiency. Patient is on Synthroid for hypothyroidism   Review of Systems  Constitutional: Negative for appetite change, chills, fatigue, fever and unexpected weight change.  HENT:   Negative for hearing loss and voice change.   Eyes: Negative  for eye problems and icterus.  Respiratory: Positive for shortness of breath. Negative for chest tightness and cough.   Cardiovascular: Negative for chest pain and leg swelling.  Gastrointestinal: Negative for abdominal distention, abdominal pain and blood in stool.  Endocrine: Negative for hot flashes.  Genitourinary: Negative for difficulty urinating, dysuria and frequency.   Musculoskeletal: Negative for arthralgias and back pain.  Skin: Negative for itching and rash.  Neurological: Negative for extremity weakness, light-headedness and numbness.  Hematological: Negative for adenopathy. Does not bruise/bleed easily.  Psychiatric/Behavioral: Negative for confusion.    MEDICAL HISTORY:  Past Medical History:  Diagnosis Date  . Anxiety   . Arthritis    SHOULDER  . Cataract   . Chronic kidney disease    low kidney function  . Colon cancer ([place]) 2015   Pt states during his colonoscopy he had cancerous polyps removed.   [name] COPD (chronic obstructive pulmonary disease) ([place])   . Depression    SINCE DIAGNOSIS  . Dyspnea    DOE  . Enlarged prostate   . GERD (gastroesophageal reflux disease)   . Hypertension   . Hypothyroidism   . Lung cancer [facility])    Aug 2018  . Pain    DUE TO LUNG ISSUE    SURGICAL HISTORY: Past Surgical History:  Procedure Laterality Date  . BACK SURGERY     kyphoplasty  T5-6  . CATARACT EXTRACTION W/ INTRAOCULAR LENS  IMPLANT, BILATERAL    . COLONOSCOPY    . EYE SURGERY Bilateral    cataract  . LYMPH GLAND EXCISION N/A 02/09/2017   Procedure: CERVICAL LYMPH NODE BIOPSY;  Surgeon: Robert Bellow, MD;  Location: [facility] ORS;  Service: General;  Laterality: N/A;  .  PORTACATH PLACEMENT Right 02/23/2017   Procedure: INSERTION PORT-A-CATH;  Surgeon: Robert Bellow, MD;  Location: [facility] ORS;  Service: General;  Laterality: Right;  . PROSTATE SURGERY    . TONSILLECTOMY    . TRANSURETHRAL RESECTION OF PROSTATE N/A 02/14/2019   Procedure: TRANSURETHRAL  RESECTION OF THE PROSTATE (TURP);  Surgeon: [name], MD;  Location: [facility] ORS;  Service: Urology;  Laterality: N/A;    SOCIAL HISTORY: Social History   Socioeconomic History  . Marital status: Married    Spouse name: Not on file  . Number of children: Not on file  . Years of education: Not on file  . Highest education level: Not on file  Occupational History  . Not on file  Tobacco Use  . Smoking status: Former Smoker    Packs/day: 1.00    Years: 53.00    Pack years: 53.00    Types: Cigarettes    Quit date: 04/14/2017    Years since quitting: 2.5  . Smokeless tobacco: Never Used  . Tobacco comment: Quit November 2018  Vaping Use  . Vaping Use: Never used  Substance and Sexual Activity  . Alcohol use: Yes    Comment: occassional  . Drug use: No  . Sexual activity: Yes  Other Topics Concern  . Not on file  Social History Narrative  . Not on file   Social Determinants of Health   Financial Resource Strain:   . Difficulty of Paying Living Expenses:   Food Insecurity:   . Worried About Charity fundraiser in the Last Year:   . Arboriculturist in the Last Year:   Transportation Needs:   . Film/video editor (Medical):   [name] Lack of Transportation (Non-Medical):   Physical Activity:   . Days of Exercise per Week:   . Minutes of Exercise per Session:   Stress:   . Feeling of Stress :   Social Connections:   . Frequency of Communication with Friends and Family:   . Frequency of Social Gatherings with Friends and Family:   . Attends Religious Services:   . Active Member of Clubs or Organizations:   . Attends Archivist Meetings:   [name] Marital Status:   Intimate Partner Violence:   . Fear of Current or Ex-Partner:   . Emotionally Abused:   [name] Physically Abused:   . Sexually Abused:     FAMILY HISTORY: Family History  Problem Relation Age of Onset  . Breast cancer Maternal Grandmother   . Dementia Mother   . Diabetes Father   . Stroke Father      ALLERGIES:  has No Known Allergies.  MEDICATIONS:  Current Outpatient Medications  Medication Sig Dispense Refill  . acetaminophen (TYLENOL) 500 MG tablet Take 1,000 mg by mouth every 6 (six) hours as needed for moderate pain.    [name] aspirin 325 MG tablet Take 650 mg by mouth daily.     . Azelastine HCl 137 MCG/SPRAY SOLN SMARTSIG:1-2 Spray(s) Both Nares Twice Daily    . budesonide (PULMICORT) 0.5 MG/2ML nebulizer solution Take by nebulization 2 (two) times daily.    . budesonide-formoterol (SYMBICORT) 160-4.5 MCG/ACT inhaler Inhale 2 puffs into the lungs 2 (two) times daily. Must contact PCP for future refills 1 Inhaler 0  . calcium-vitamin D (OSCAL WITH D) 500-200 MG-UNIT tablet Take 1 tablet by mouth daily. 90 tablet 1  . Chlorpheniramine-Phenylephrine (SINUS & ALLERGY) 4-10 MG tablet Take 1 tablet by mouth every 6 (six) hours  as needed for allergies.     . diphenhydrAMINE-zinc acetate (BENADRYL EXTRA STRENGTH) cream Apply 1 application topically 3 (three) times daily as needed for itching. 28 g 0  . fluticasone (FLONASE) 50 MCG/ACT nasal spray Place 1 spray into both nostrils daily. 16 g 2  . levothyroxine (SYNTHROID) 150 MCG tablet Take 150 mcg by mouth daily before breakfast.    . lidocaine-prilocaine (EMLA) cream Apply over port 1-2 hours prior to chemotherapy treatment.  Cover with plastic wrap. 30 g 1  . Melatonin 10 MG TABS Take 10 mg by mouth at bedtime.     . Multiple Vitamin (MULTI-VITAMINS) TABS Take 1 tablet by mouth daily.     . ondansetron (ZOFRAN) 8 MG tablet Take 1 tablet (8 mg total) by mouth every 12 (twelve) hours as needed for nausea or vomiting. 60 tablet 0  . pantoprazole (PROTONIX) 40 MG tablet Take 1 tablet (40 mg total) by mouth daily. 90 tablet 0  . predniSONE (DELTASONE) 10 MG tablet Take one tablet each morning. Take one-half tablet each afternoon (take between 2 - 5 PM) (Patient taking differently: Take 5-10 mg by mouth See admin instructions. Take 10 mg by  mouth in the morning and 5 mg in the late afternoon (between 2 - 5 PM)) 45 tablet 0  . PROAIR HFA 108 (90 Base) MCG/ACT inhaler INHALE 2 PUFFS BY MOUTH EVERY 6 HOURS AS NEEDED (Patient taking differently: Inhale 2 puffs into the lungs every 6 (six) hours as needed for wheezing or shortness of breath. ) 8.5 g 0  . prochlorperazine (COMPAZINE) 10 MG tablet Take 1 tablet (10 mg total) by mouth every 6 (six) hours as needed for nausea or vomiting. 60 tablet 2  . sertraline (ZOLOFT) 25 MG tablet TAKE 1 TABLET(25 MG) BY MOUTH DAILY 90 tablet 0  . sulfamethoxazole-trimethoprim (BACTRIM) 400-80 MG tablet SMARTSIG:1 Tablet(s) By Mouth Every 12 Hours    . tadalafil (CIALIS) 5 MG tablet Take 1 tablet (5 mg total) by mouth daily as needed for erectile dysfunction. 30 tablet 11  . tamsulosin (FLOMAX) 0.4 MG CAPS capsule Take 1 capsule (0.4 mg total) by mouth daily. 90 capsule 1  . tiotropium (SPIRIVA HANDIHALER) 18 MCG inhalation capsule INHALE 1 CAPSULE DAILY (Patient taking differently: Place 18 mcg into inhaler and inhale daily. ) 30 capsule 0  . traMADol (ULTRAM) 50 MG tablet Take 1 tablet (50 mg total) by mouth every 6 (six) hours as needed for moderate pain or severe pain. 30 tablet 0  . triamcinolone ointment (KENALOG) 0.5 % Apply 1 application topically 3 (three) times daily as needed. 30 g 1   No current facility-administered medications for this visit.   Facility-Administered Medications Ordered in Other Visits  Medication Dose Route Frequency Provider Last Rate Last Admin  . sodium chloride flush (NS) 0.9 % injection 10 mL  10 mL Intravenous PRN [name], MD   10 mL at 04/03/17 9892      .  PHYSICAL EXAMINATION: ECOG PERFORMANCE STATUS: 1 - Symptomatic but completely ambulatory Vitals:   11/07/19 1318  BP: 131/76  Pulse: 78  Temp: 98.1 F (36.7 C)  SpO2: 96%   Filed Weights   11/07/19 1318  Weight: 243 lb 8 oz (110.5 kg)  Physical Exam Constitutional:      General: He is not in  acute distress.    Appearance: He is not diaphoretic.  HENT:     Head: Normocephalic and atraumatic.     Nose: Nose normal.  Mouth/Throat:     Pharynx: No oropharyngeal exudate.  Eyes:     General: No scleral icterus.    Pupils: Pupils are equal, round, and reactive to light.  Cardiovascular:     Rate and Rhythm: Normal rate and regular rhythm.     Heart sounds: No murmur heard.   Pulmonary:     Effort: Pulmonary effort is normal. No respiratory distress.     Breath sounds: No rales.  Chest:     Chest wall: No tenderness.  Abdominal:     General: There is no distension.     Palpations: Abdomen is soft.     Tenderness: There is no abdominal tenderness.  Musculoskeletal:        General: Normal range of motion.     Cervical back: Normal range of motion and neck supple.  Skin:    General: Skin is warm and dry.     Findings: No erythema.  Neurological:     Mental Status: He is alert and oriented to person, place, and time.     Cranial Nerves: No cranial nerve deficit.     Motor: No abnormal muscle tone.     Coordination: Coordination normal.  Psychiatric:        Mood and Affect: Affect normal.    LABORATORY DATA:  I have reviewed the data as listed Lab Results  Component Value Date   WBC 6.8 11/07/2019   HGB 13.2 11/07/2019   HCT 40.3 11/07/2019   MCV 93.9 11/07/2019   PLT 169 11/07/2019   Recent Labs    05/09/19 1349 08/06/19 1304 11/07/19 1250  NA 139 137 140  K 3.8 4.6 4.1  CL 105 103 106  CO2 26 25 25   GLUCOSE 97 108* 115*  BUN 22 21 24*  CREATININE 1.32* 1.19 1.36*  CALCIUM 8.6* 8.8* 8.8*  GFRNONAA 54* >60 52*  GFRAA >60 >60 60*  PROT 6.4* 6.6 6.8  ALBUMIN 3.4* 3.6 3.7  AST 17 20 18   ALT 23 23 21   ALKPHOS 61 60 52  BILITOT 0.7 0.6 0.7  RADIOGRAPHIC STUDIES: I have personally reviewed the radiological images as listed and agreed with the findings in the report. CT CHEST WO CONTRAST  Result Date: 11/04/2019 CLINICAL DATA:  Follow-up left lung  carcinoma. Status post chemotherapy, radiation therapy, and immunotherapy. EXAM: CT CHEST WITHOUT CONTRAST TECHNIQUE: Multidetector CT imaging of the chest was performed following the standard protocol without IV contrast. COMPARISON:  08/04/2019 FINDINGS: Cardiovascular: No acute findings. Stable mild pericardial thickening. Aortic and coronary artery atherosclerosis noted. Mediastinum/Nodes: No masses or pathologically enlarged lymph nodes identified on this unenhanced exam. Lungs/Pleura: Stable post radiation changes seen in the left paramediastinal lung zone. No evidence of central peribronchial obstruction. No suspicious pulmonary nodules or masses are identified. Mild centrilobular emphysema again noted. No evidence of pleural effusion. Upper Abdomen:  Unremarkable. Musculoskeletal:  No suspicious bone lesions. IMPRESSION: Stable post radiation changes in left hemithorax. No evidence of recurrent or metastatic carcinoma, or other acute findings. Aortic Atherosclerosis (ICD10-I70.0) and Emphysema (ICD10-J43.9). Electronically Signed   By: Marlaine Hind M.D.   On: 11/04/2019 16:07   ASSESSMENT & PLAN:  Cancer Staging Adenocarcinoma of left lung, stage 3 (HCC) Staging form: Lung, AJCC 8th Edition - Clinical stage from 02/14/2017: Stage IIIB (cT2b, cN3, cM0) - Signed by [name], MD on 02/14/2017  1. Adenocarcinoma of left lung, stage 3 ([place])   2. Compression fracture of body of thoracic vertebra (HCC)   3. Port-A-Cath in  place   4. MGUS (monoclonal gammopathy of unknown significance)   5. Adrenal insufficiency ([place])   6. Hypothyroidism due to medication   7. Prostate cancer ([place])   8. Weight gain due to medication    #Stage IIIB lung adenocarcinoma, s/p concurrent chemo/RT and immunotherapy, finished all treatment in January 2020 CT images were reviewed and discussed with him.  Stable, no recurrence or progression.  Continue CT surveillance every 3-6 months.   #Shortness of breath with  exertion, COPD/emphysema Follows up with pulmonology  #Hypothyroidism and adrenal insufficiency.  Managed by Endocrinology Chronic steroid use, check bone density. Recommend calcium and Vitamin D  #Weight gain, secondary to chronic steroid use.  Lifestyle modification discussed with patient.  # MGUS,  multiple myeloma panel is stable.  Check multiple myeloma panel at next visit. .. #CKD, avoid NSAIDs. #Prostate adenocarcinoma, Gleason score 6, currently on observation.  Last PSA 09/01/2019 1.5. follow up with urology #Port-A-Cath in place, continue port flush every 6 to 8 weeks. CT showed narrowing of the low right internal jugular vein and superior vena cava with indwelling Port-A-Cath. Discussed with Dr. [name], no intervention as long as port is functioning. He is now about 1.5 year after he finishes all treatments. May consider port discontinuation after reaches 2 years   Repeat CT scan in 3 months and follow-up in the clinic. Orders Placed This Encounter  Procedures  . CT Chest Wo Contrast    Standing Status:   Future    Standing Expiration Date:   11/06/2020    Order Specific Question:   ** REASON FOR EXAM (FREE TEXT)    Answer:   lung cancer follow up    Order Specific Question:   Preferred imaging location?    Answer:   [facility]    Order Specific Question:   Radiology Contrast Protocol - do NOT remove file path    Answer:   [email or web address]  . DG Bone Density    Standing Status:   Future    Standing Expiration Date:   11/06/2020    Order Specific Question:   Reason for Exam (SYMPTOM  OR DIAGNOSIS REQUIRED)    Answer:   follow up on compression fracture    Order Specific Question:   Preferred imaging location?    Answer:   [facility]  . Multiple Myeloma Panel (SPEP&IFE w/QIG)    Standing Status:   Future    Standing Expiration Date:   11/07/2020  . Kappa/lambda light chains    Standing Status:   Future    Standing Expiration Date:    11/07/2020  . CBC with Differential/Platelet    Standing Status:   Future    Standing Expiration Date:   11/07/2020  . Comprehensive metabolic panel    Standing Status:   Future    Standing Expiration Date:   11/07/2020   Follow up in 4 months, repeat CT chest surveillance scan.   [name], MD, PhD  11/07/2019

## 2019-11-22 DIAGNOSIS — J439 Emphysema, unspecified: Secondary | ICD-10-CM | POA: Diagnosis not present

## 2019-11-26 DIAGNOSIS — R0602 Shortness of breath: Secondary | ICD-10-CM | POA: Diagnosis not present

## 2019-11-26 DIAGNOSIS — Z01818 Encounter for other preprocedural examination: Secondary | ICD-10-CM | POA: Diagnosis not present

## 2019-12-15 ENCOUNTER — Other Ambulatory Visit
Admission: RE | Admit: 2019-12-15 | Discharge: 2019-12-15 | Disposition: A | Discharge status: Home / Self Care | Payer: PPO | Source: Ambulatory Visit | Attending: General Surgery | Admitting: General Surgery

## 2019-12-15 ENCOUNTER — Other Ambulatory Visit: Payer: Self-pay

## 2019-12-15 DIAGNOSIS — Z20822 Contact with and (suspected) exposure to covid-19: Secondary | ICD-10-CM | POA: Diagnosis not present

## 2019-12-15 DIAGNOSIS — Z01812 Encounter for preprocedural laboratory examination: Secondary | ICD-10-CM | POA: Insufficient documentation

## 2019-12-15 LAB — SARS CORONAVIRUS 2 (TAT 6-24 HRS): SARS Coronavirus 2: NEGATIVE

## 2019-12-16 ENCOUNTER — Encounter: Payer: Self-pay | Admitting: General Surgery

## 2019-12-17 ENCOUNTER — Other Ambulatory Visit: Payer: Self-pay

## 2019-12-17 ENCOUNTER — Ambulatory Visit: Payer: PPO | Admitting: Registered Nurse

## 2019-12-17 ENCOUNTER — Encounter
Admission: RE | Disposition: A | Discharge status: Home / Self Care | Payer: Self-pay | Source: Home / Self Care | Attending: General Surgery

## 2019-12-17 ENCOUNTER — Encounter: Payer: Self-pay | Admitting: General Surgery

## 2019-12-17 ENCOUNTER — Ambulatory Visit
Admission: RE | Admit: 2019-12-17 | Discharge: 2019-12-17 | Disposition: A | Discharge status: Home / Self Care | Payer: PPO | Attending: General Surgery | Admitting: General Surgery

## 2019-12-17 DIAGNOSIS — C3490 Malignant neoplasm of unspecified part of unspecified bronchus or lung: Secondary | ICD-10-CM | POA: Insufficient documentation

## 2019-12-17 DIAGNOSIS — Z85038 Personal history of other malignant neoplasm of large intestine: Secondary | ICD-10-CM | POA: Insufficient documentation

## 2019-12-17 DIAGNOSIS — D120 Benign neoplasm of cecum: Secondary | ICD-10-CM | POA: Diagnosis not present

## 2019-12-17 DIAGNOSIS — Z7689 Persons encountering health services in other specified circumstances: Secondary | ICD-10-CM | POA: Diagnosis not present

## 2019-12-17 DIAGNOSIS — F329 Major depressive disorder, single episode, unspecified: Secondary | ICD-10-CM | POA: Diagnosis not present

## 2019-12-17 DIAGNOSIS — D123 Benign neoplasm of transverse colon: Secondary | ICD-10-CM | POA: Insufficient documentation

## 2019-12-17 DIAGNOSIS — Z7951 Long term (current) use of inhaled steroids: Secondary | ICD-10-CM | POA: Insufficient documentation

## 2019-12-17 DIAGNOSIS — Z7952 Long term (current) use of systemic steroids: Secondary | ICD-10-CM | POA: Insufficient documentation

## 2019-12-17 DIAGNOSIS — Z885 Allergy status to narcotic agent status: Secondary | ICD-10-CM | POA: Diagnosis not present

## 2019-12-17 DIAGNOSIS — J449 Chronic obstructive pulmonary disease, unspecified: Secondary | ICD-10-CM | POA: Diagnosis not present

## 2019-12-17 DIAGNOSIS — E039 Hypothyroidism, unspecified: Secondary | ICD-10-CM | POA: Insufficient documentation

## 2019-12-17 DIAGNOSIS — K219 Gastro-esophageal reflux disease without esophagitis: Secondary | ICD-10-CM | POA: Insufficient documentation

## 2019-12-17 DIAGNOSIS — Z86010 Personal history of colonic polyps: Secondary | ICD-10-CM | POA: Insufficient documentation

## 2019-12-17 DIAGNOSIS — Z79890 Hormone replacement therapy (postmenopausal): Secondary | ICD-10-CM | POA: Insufficient documentation

## 2019-12-17 DIAGNOSIS — Z1211 Encounter for screening for malignant neoplasm of colon: Secondary | ICD-10-CM | POA: Diagnosis not present

## 2019-12-17 DIAGNOSIS — N400 Enlarged prostate without lower urinary tract symptoms: Secondary | ICD-10-CM | POA: Diagnosis not present

## 2019-12-17 DIAGNOSIS — D122 Benign neoplasm of ascending colon: Secondary | ICD-10-CM | POA: Insufficient documentation

## 2019-12-17 DIAGNOSIS — M19019 Primary osteoarthritis, unspecified shoulder: Secondary | ICD-10-CM | POA: Insufficient documentation

## 2019-12-17 DIAGNOSIS — I129 Hypertensive chronic kidney disease with stage 1 through stage 4 chronic kidney disease, or unspecified chronic kidney disease: Secondary | ICD-10-CM | POA: Insufficient documentation

## 2019-12-17 DIAGNOSIS — K635 Polyp of colon: Secondary | ICD-10-CM | POA: Diagnosis not present

## 2019-12-17 DIAGNOSIS — N189 Chronic kidney disease, unspecified: Secondary | ICD-10-CM | POA: Insufficient documentation

## 2019-12-17 DIAGNOSIS — Z87891 Personal history of nicotine dependence: Secondary | ICD-10-CM | POA: Insufficient documentation

## 2019-12-17 DIAGNOSIS — F419 Anxiety disorder, unspecified: Secondary | ICD-10-CM | POA: Diagnosis not present

## 2019-12-17 HISTORY — DX: Benign prostatic hyperplasia without lower urinary tract symptoms: N40.0

## 2019-12-17 HISTORY — DX: Benign neoplasm of colon, unspecified: D12.6

## 2019-12-17 HISTORY — PX: COLONOSCOPY WITH PROPOFOL: SHX5780

## 2019-12-17 HISTORY — DX: Atherosclerosis of aorta: I70.0

## 2019-12-17 SURGERY — COLONOSCOPY WITH PROPOFOL
Anesthesia: General

## 2019-12-17 MED ORDER — DEXMEDETOMIDINE HCL 200 MCG/2ML IV SOLN
INTRAVENOUS | Status: DC | PRN
  Administered 2019-12-17: 20 ug via INTRAVENOUS

## 2019-12-17 MED ORDER — EPHEDRINE SULFATE 50 MG/ML IJ SOLN
INTRAMUSCULAR | Status: DC | PRN
  Administered 2019-12-17: 15 mg via INTRAVENOUS
  Administered 2019-12-17: 10 mg via INTRAVENOUS

## 2019-12-17 MED ORDER — PHENYLEPHRINE HCL (PRESSORS) 10 MG/ML IV SOLN
INTRAVENOUS | Status: DC | PRN
  Administered 2019-12-17: 100 ug via INTRAVENOUS
  Administered 2019-12-17 (×2): 200 ug via INTRAVENOUS
  Administered 2019-12-17 (×2): 100 ug via INTRAVENOUS

## 2019-12-17 MED ORDER — EPHEDRINE 5 MG/ML INJ
INTRAVENOUS | Status: AC
  Filled 2019-12-17: qty 4

## 2019-12-17 MED ORDER — PROPOFOL 500 MG/50ML IV EMUL
INTRAVENOUS | Status: DC | PRN
  Administered 2019-12-17: 150 ug/kg/min via INTRAVENOUS

## 2019-12-17 MED ORDER — LIDOCAINE HCL (CARDIAC) PF 100 MG/5ML IV SOSY
PREFILLED_SYRINGE | INTRAVENOUS | Status: DC | PRN
  Administered 2019-12-17: 100 mg via INTRAVENOUS

## 2019-12-17 MED ORDER — PROPOFOL 10 MG/ML IV BOLUS
INTRAVENOUS | Status: DC | PRN
  Administered 2019-12-17: 80 mg via INTRAVENOUS

## 2019-12-17 MED ORDER — SODIUM CHLORIDE 0.9 % IV SOLN: INTRAVENOUS | Status: DC

## 2019-12-17 NOTE — Anesthesia Preprocedure Evaluation (Signed)
Anesthesia Evaluation  Patient identified by MRN, date of birth, ID band Patient awake    Reviewed: Allergy & Precautions, H&P , NPO status , Patient's Chart, lab work & pertinent test results, reviewed documented beta blocker date and time   Airway Mallampati: III   Neck ROM: full    Dental  (+) Poor Dentition   Pulmonary shortness of breath and with exertion, COPD,  COPD inhaler, Patient abstained from smoking.Not current smoker, former smoker,    Pulmonary exam normal        Cardiovascular Exercise Tolerance: Poor hypertension, On Medications negative cardio ROS Normal cardiovascular exam Rhythm:regular Rate:Normal     Neuro/Psych PSYCHIATRIC DISORDERS Anxiety Depression negative neurological ROS     GI/Hepatic Neg liver ROS, GERD  Medicated,  Endo/Other  Hypothyroidism   Renal/GU Renal disease  negative genitourinary   Musculoskeletal   Abdominal   Peds  Hematology negative hematology ROS (+)   Anesthesia Other Findings Past Medical History: No date: Anxiety No date: Aortic atherosclerosis (HCC) No date: Arthritis     Comment:  SHOULDER No date: BPH (benign prostatic hyperplasia) No date: Cataract No date: Chronic kidney disease     Comment:  low kidney function No date: Colon adenomas 2015: Colon cancer ([place])     Comment:  Pt states during his colonoscopy he had cancerous polyps              removed.  No date: COPD (chronic obstructive pulmonary disease) (HCC) No date: Depression     Comment:  SINCE DIAGNOSIS No date: Dyspnea     Comment:  DOE No date: Enlarged prostate No date: GERD (gastroesophageal reflux disease) No date: Hypertension No date: Hypothyroidism No date: Lung cancer [facility])     Comment:  Aug 2018 No date: Pain     Comment:  DUE TO LUNG ISSUE Past Surgical History: No date: BACK SURGERY     Comment:  kyphoplasty  T5-6 No date: CATARACT EXTRACTION W/ INTRAOCULAR LENS  IMPLANT,  BILATERAL No date: COLONOSCOPY No date: EYE SURGERY; Bilateral     Comment:  cataract 02/09/2017: LYMPH GLAND EXCISION; N/A     Comment:  Procedure: CERVICAL LYMPH NODE BIOPSY;  Surgeon:               Robert Bellow, MD;  Location: [facility] ORS;  Service:               General;  Laterality: N/A; 02/23/2017: PORTACATH PLACEMENT; Right     Comment:  Procedure: INSERTION PORT-A-CATH;  Surgeon: Robert Bellow, MD;  Location: [facility] ORS;  Service: General;                Laterality: Right; No date: PROSTATE SURGERY No date: TONSILLECTOMY 02/14/2019: TRANSURETHRAL RESECTION OF PROSTATE; N/A     Comment:  Procedure: TRANSURETHRAL RESECTION OF THE PROSTATE               (TURP);  Surgeon: [name], MD;  Location: [facility]               ORS;  Service: Urology;  Laterality: N/A;   Reproductive/Obstetrics negative OB ROS                             Anesthesia Physical Anesthesia Plan  ASA: III  Anesthesia Plan: General   Post-op Pain Management:    Induction:  PONV Risk Score and Plan:   Airway Management Planned:   Additional Equipment:   Intra-op Plan:   Post-operative Plan:   Informed Consent: I have reviewed the patients History and Physical, chart, labs and discussed the procedure including the risks, benefits and alternatives for the proposed anesthesia with the patient or authorized representative who has indicated his/her understanding and acceptance.     Dental Advisory Given  Plan Discussed with: CRNA  Anesthesia Plan Comments:         Anesthesia Quick Evaluation

## 2019-12-17 NOTE — H&P (Signed)
Marc Gray [id number] 01-31-1946     HPI:  74 y/o male with prior colonoscopy showing multiple hyperplastic polyps as well as one tubular adenoma.  (2015, Gaylyn Cheers, MD).   Treatment for advanced lung cancer starting in late 2018, on maintenance therapy. On chronic steroid therapy.   Medications Prior to Admission  Medication Sig Dispense Refill Last Dose  . acetaminophen (TYLENOL) 500 MG tablet Take 1,000 mg by mouth every 6 (six) hours as needed for moderate pain.   Past Week at Unknown time  . Azelastine HCl 137 MCG/SPRAY SOLN SMARTSIG:1-2 Spray(s) Both Nares Twice Daily   12/16/2019 at Unknown time  . budesonide (PULMICORT) 0.5 MG/2ML nebulizer solution Take by nebulization 2 (two) times daily.   12/17/2019 at 0600  . budesonide-formoterol (SYMBICORT) 160-4.5 MCG/ACT inhaler Inhale 2 puffs into the lungs 2 (two) times daily. Must contact PCP for future refills 1 Inhaler 0 12/16/2019 at Unknown time  . calcium-vitamin D (OSCAL WITH D) 500-200 MG-UNIT tablet Take 1 tablet by mouth daily. 90 tablet 1 12/16/2019 at Unknown time  . Chlorpheniramine-Phenylephrine (SINUS & ALLERGY) 4-10 MG tablet Take 1 tablet by mouth every 6 (six) hours as needed for allergies.    12/16/2019 at Unknown time  . diphenhydrAMINE-zinc acetate (BENADRYL EXTRA STRENGTH) cream Apply 1 application topically 3 (three) times daily as needed for itching. 28 g 0 12/16/2019 at Unknown time  . fluticasone (FLONASE) 50 MCG/ACT nasal spray Place 1 spray into both nostrils daily. 16 g 2 12/16/2019 at Unknown time  . levothyroxine (SYNTHROID) 150 MCG tablet Take 150 mcg by mouth daily before breakfast.   12/16/2019 at Unknown time  . lidocaine-prilocaine (EMLA) cream Apply over port 1-2 hours prior to chemotherapy treatment.  Cover with plastic wrap. 30 g 1 12/16/2019 at Unknown time  . Melatonin 10 MG TABS Take 10 mg by mouth at bedtime.    12/16/2019 at Unknown time  . Multiple Vitamin (MULTI-VITAMINS) TABS Take 1  tablet by mouth daily.    12/16/2019 at Unknown time  . ondansetron (ZOFRAN) 8 MG tablet Take 1 tablet (8 mg total) by mouth every 12 (twelve) hours as needed for nausea or vomiting. 60 tablet 0 12/16/2019 at Unknown time  . pantoprazole (PROTONIX) 40 MG tablet Take 1 tablet (40 mg total) by mouth daily. 90 tablet 0 12/16/2019 at Unknown time  . polyethylene glycol powder (GLYCOLAX/MIRALAX) 17 GM/SCOOP powder TAKE AS DIRECTED FOR COLONIC PREP   12/16/2019 at Unknown time  . predniSONE (DELTASONE) 10 MG tablet Take one tablet each morning. Take one-half tablet each afternoon (take between 2 - 5 PM) (Patient taking differently: Take 5-10 mg by mouth See admin instructions. Take 10 mg by mouth in the morning and 5 mg in the late afternoon (between 2 - 5 PM)) 45 tablet 0 12/16/2019 at Unknown time  . PROAIR HFA 108 (90 Base) MCG/ACT inhaler INHALE 2 PUFFS BY MOUTH EVERY 6 HOURS AS NEEDED (Patient taking differently: Inhale 2 puffs into the lungs every 6 (six) hours as needed for wheezing or shortness of breath. ) 8.5 g 0 12/16/2019 at Unknown time  . prochlorperazine (COMPAZINE) 10 MG tablet Take 1 tablet (10 mg total) by mouth every 6 (six) hours as needed for nausea or vomiting. 60 tablet 2 12/16/2019 at Unknown time  . sertraline (ZOLOFT) 25 MG tablet TAKE 1 TABLET(25 MG) BY MOUTH DAILY 90 tablet 0 12/16/2019 at Unknown time  . sulfamethoxazole-trimethoprim (BACTRIM) 400-80 MG tablet SMARTSIG:1 Tablet(s) By Mouth Every 12  Hours   12/16/2019 at Unknown time  . tadalafil (CIALIS) 5 MG tablet Take 1 tablet (5 mg total) by mouth daily as needed for erectile dysfunction. 30 tablet 11 12/16/2019 at Unknown time  . tamsulosin (FLOMAX) 0.4 MG CAPS capsule Take 1 capsule (0.4 mg total) by mouth daily. 90 capsule 1 12/16/2019 at Unknown time  . tiotropium (SPIRIVA HANDIHALER) 18 MCG inhalation capsule INHALE 1 CAPSULE DAILY (Patient taking differently: Place 18 mcg into inhaler and inhale daily. ) 30 capsule 0 12/17/2019 at  0600  . traMADol (ULTRAM) 50 MG tablet Take 1 tablet (50 mg total) by mouth every 6 (six) hours as needed for moderate pain or severe pain. 30 tablet 0 12/16/2019 at Unknown time  . triamcinolone ointment (KENALOG) 0.5 % Apply 1 application topically 3 (three) times daily as needed. 30 g 1 12/16/2019 at Unknown time  . aspirin 325 MG tablet Take 650 mg by mouth daily.  (Patient not taking: Reported on 11/07/2019)   Not Taking at Unknown time   Allergies  Allergen Reactions  . Morphine And Related    Past Medical History:  Diagnosis Date  . Anxiety   . Aortic atherosclerosis ([place])   . Arthritis    SHOULDER  . BPH (benign prostatic hyperplasia)   . Cataract   . Chronic kidney disease    low kidney function  . Colon adenomas   . Colon cancer ([place]) 2015   Pt states during his colonoscopy he had cancerous polyps removed.   [name] COPD (chronic obstructive pulmonary disease) ([place])   . Depression    SINCE DIAGNOSIS  . Dyspnea    DOE  . Enlarged prostate   . GERD (gastroesophageal reflux disease)   . Hypertension   . Hypothyroidism   . Lung cancer [facility])    Aug 2018  . Pain    DUE TO LUNG ISSUE   Past Surgical History:  Procedure Laterality Date  . BACK SURGERY     kyphoplasty  T5-6  . CATARACT EXTRACTION W/ INTRAOCULAR LENS  IMPLANT, BILATERAL    . COLONOSCOPY    . EYE SURGERY Bilateral    cataract  . LYMPH GLAND EXCISION N/A 02/09/2017   Procedure: CERVICAL LYMPH NODE BIOPSY;  Surgeon: Robert Bellow, MD;  Location: [facility];  Service: General;  Laterality: N/A;  . PORTACATH PLACEMENT Right 02/23/2017   Procedure: INSERTION PORT-A-CATH;  Surgeon: Robert Bellow, MD;  Location: [facility];  Service: General;  Laterality: Right;  . PROSTATE SURGERY    . TONSILLECTOMY    . TRANSURETHRAL RESECTION OF PROSTATE N/A 02/14/2019   Procedure: TRANSURETHRAL RESECTION OF THE PROSTATE (TURP);  Surgeon: [name], MD;  Location: [facility];  Service: Urology;  Laterality: N/A;    Social History   Socioeconomic History  . Marital status: Married    Spouse name: Not on file  . Number of children: Not on file  . Years of education: Not on file  . Highest education level: Not on file  Occupational History  . Not on file  Tobacco Use  . Smoking status: Former Smoker    Packs/day: 1.00    Years: 53.00    Pack years: 53.00    Types: Cigarettes    Quit date: 04/14/2017    Years since quitting: 2.6  . Smokeless tobacco: Never Used  . Tobacco comment: Quit November 2018  Vaping Use  . Vaping Use: Never used  Substance and Sexual Activity  . Alcohol use: Not Currently  Comment: occassional  . Drug use: No  . Sexual activity: Yes  Other Topics Concern  . Not on file  Social History Narrative  . Not on file   Social Determinants of Health   Financial Resource Strain:   . Difficulty of Paying Living Expenses:   Food Insecurity:   . Worried About Charity fundraiser in the Last Year:   . Arboriculturist in the Last Year:   Transportation Needs:   . Film/video editor (Medical):   [name] Lack of Transportation (Non-Medical):   Physical Activity:   . Days of Exercise per Week:   . Minutes of Exercise per Session:   Stress:   . Feeling of Stress :   Social Connections:   . Frequency of Communication with Friends and Family:   . Frequency of Social Gatherings with Friends and Family:   . Attends Religious Services:   . Active Member of Clubs or Organizations:   . Attends Archivist Meetings:   [name] Marital Status:   Intimate Partner Violence:   . Fear of Current or Ex-Partner:   . Emotionally Abused:   [name] Physically Abused:   . Sexually Abused:    Social History   Social History Narrative  . Not on file     ROS: Negative.     PE: HEENT: Negative. Lungs: Clear. Cardio: RR.  Assessment/Plan:  Proceed with planned endoscopy.   [name] [facility] 12/17/2019

## 2019-12-17 NOTE — Op Note (Signed)
[facility] Patient Name: Marc Gray Procedure Date: 12/17/2019 10:08 AM MRN: [id number] Account #: [id number] Date of Birth: [birth year] Admit Type: Outpatient Age: 74 Room: [facility] ENDO ROOM 1 Gender: Male Note Status: Finalized Procedure:             Colonoscopy Indications:           High risk colon cancer surveillance: Personal history                         of colonic polyps Providers:             Robert Bellow, MD Referring MD:          Irven Easterly. Kary Kos, MD (Referring MD) Medicines:             Monitored Anesthesia Care Complications:         No immediate complications. Procedure:             Pre-Anesthesia Assessment:                        - Prior to the procedure, a History and Physical was                         performed, and patient medications, allergies and                         sensitivities were reviewed. The patient's tolerance                         of previous anesthesia was reviewed.                        - The risks and benefits of the procedure and the                         sedation options and risks were discussed with the                         patient. All questions were answered and informed                         consent was obtained.                        After obtaining informed consent, the colonoscope was                         passed under direct vision. Throughout the procedure,                         the patient's blood pressure, pulse, and oxygen                         saturations were monitored continuously. The                         Colonoscope was introduced through the anus and                         advanced to the the cecum,  identified by appendiceal                         orifice and ileocecal valve. The colonoscopy was                         somewhat difficult due to significant looping.                         Successful completion of the procedure was aided by                          using manual pressure. The patient tolerated the                         procedure well. The quality of the bowel preparation                         was excellent. Findings:      Eight sessile polyps were found in the splenic flexure, transverse       colon, ascending colon and cecum. The polyps were 5 to 10 mm in size.       These polyps were removed with a hot snare. Resection and retrieval were       complete.      Five polyps were in the distal transverse/ splenic flexure. The largest       in the ascending colon.      The retroflexed view of the distal rectum and anal verge was normal and       showed no anal or rectal abnormalities. Impression:            - Eight 5 to 10 mm polyps at the splenic flexure, in                         the transverse colon, in the ascending colon and in                         the cecum, removed with a hot snare. Resected and                         retrieved.                        - The distal rectum and anal verge are normal on                         retroflexion view. Recommendation:        - Telephone endoscopist for pathology results in 1                         week. Procedure Code(s):     --- Professional ---                        [id number], Colonoscopy, flexible; with removal of                         tumor(s), polyp(s), or other lesion(s) by snare  technique Diagnosis Code(s):     --- Professional ---                        Z86.010, Personal history of colonic polyps                        K63.5, Polyp of colon CPT copyright 2019 American [employer] Association. All rights reserved. The codes documented in this report are preliminary and upon coder review may  be revised to meet current compliance requirements. Robert Bellow, MD 12/17/2019 11:15:12 AM This report has been signed electronically. Number of Addenda: 0 Note Initiated On: 12/17/2019 10:08 AM Scope Withdrawal Time: 0 hours 19 minutes 8 seconds   Total Procedure Duration: 0 hours 39 minutes 41 seconds  Estimated Blood Loss:  Estimated blood loss: none.      [facility]

## 2019-12-17 NOTE — Transfer of Care (Signed)
Immediate Anesthesia Transfer of Care Note  Patient: Marc Gray  Procedure(s) Performed: COLONOSCOPY WITH PROPOFOL (N/A )  Patient Location: PACU  Anesthesia Type:General  Level of Consciousness: drowsy  Airway & Oxygen Therapy: Patient Spontanous Breathing  Post-op Assessment: Report given to RN and Post -op Vital signs reviewed and stable  Post vital signs: Reviewed and stable  Last Vitals:  Vitals Value Taken Time  BP 120/73 12/17/19 1119  Temp 36.6 C 12/17/19 1117  Pulse 75 12/17/19 1119  Resp 15 12/17/19 1119  SpO2 97 % 12/17/19 1119  Vitals shown include unvalidated device data.  Last Pain:  Vitals:   12/17/19 1117  TempSrc: Temporal  PainSc: Asleep         Complications: No complications documented.

## 2019-12-18 ENCOUNTER — Encounter: Payer: Self-pay | Admitting: General Surgery

## 2019-12-18 LAB — SURGICAL PATHOLOGY

## 2019-12-19 ENCOUNTER — Other Ambulatory Visit: Payer: Self-pay

## 2019-12-19 ENCOUNTER — Inpatient Hospital Stay: Payer: PPO | Attending: Oncology

## 2019-12-19 DIAGNOSIS — C3492 Malignant neoplasm of unspecified part of left bronchus or lung: Secondary | ICD-10-CM | POA: Insufficient documentation

## 2019-12-19 DIAGNOSIS — Z95828 Presence of other vascular implants and grafts: Secondary | ICD-10-CM

## 2019-12-19 DIAGNOSIS — Z9221 Personal history of antineoplastic chemotherapy: Secondary | ICD-10-CM | POA: Insufficient documentation

## 2019-12-19 DIAGNOSIS — Z452 Encounter for adjustment and management of vascular access device: Secondary | ICD-10-CM | POA: Diagnosis not present

## 2019-12-19 MED ORDER — HEPARIN SOD (PORK) LOCK FLUSH 100 UNIT/ML IV SOLN
500.0000 [IU] | Freq: Once | INTRAVENOUS | Status: AC
  Administered 2019-12-19: 500 [IU] via INTRAVENOUS
  Filled 2019-12-19: qty 5

## 2019-12-19 MED ORDER — SODIUM CHLORIDE 0.9% FLUSH
10.0000 mL | Freq: Once | INTRAVENOUS | Status: AC
  Administered 2019-12-19: 10 mL via INTRAVENOUS
  Filled 2019-12-19: qty 10

## 2019-12-19 MED ORDER — HEPARIN SOD (PORK) LOCK FLUSH 100 UNIT/ML IV SOLN
INTRAVENOUS | Status: AC
  Filled 2019-12-19: qty 5

## 2019-12-22 DIAGNOSIS — J439 Emphysema, unspecified: Secondary | ICD-10-CM | POA: Diagnosis not present

## 2019-12-22 NOTE — Anesthesia Postprocedure Evaluation (Signed)
Anesthesia Post Note  Patient: Marc Gray  Procedure(s) Performed: COLONOSCOPY WITH PROPOFOL (N/A )  Patient location during evaluation: PACU Anesthesia Type: General Level of consciousness: awake and alert Pain management: pain level controlled Vital Signs Assessment: post-procedure vital signs reviewed and stable Respiratory status: spontaneous breathing, nonlabored ventilation, respiratory function stable and patient connected to nasal cannula oxygen Cardiovascular status: blood pressure returned to baseline and stable Postop Assessment: no apparent nausea or vomiting Anesthetic complications: no   No complications documented.   Last Vitals:  Vitals:   12/17/19 1137 12/17/19 1145  BP: 132/81 140/84  Pulse: 70 73  Resp: 15 12  Temp:    SpO2: 100% 100%    Last Pain:  Vitals:   12/18/19 0758  TempSrc:   PainSc: 0-No pain                 Molli Barrows

## 2020-01-05 ENCOUNTER — Other Ambulatory Visit: Payer: Self-pay

## 2020-01-05 ENCOUNTER — Ambulatory Visit
Admission: RE | Admit: 2020-01-05 | Discharge: 2020-01-05 | Disposition: A | Discharge status: Home / Self Care | Payer: PPO | Source: Ambulatory Visit | Attending: Oncology | Admitting: Oncology

## 2020-01-05 DIAGNOSIS — Z801 Family history of malignant neoplasm of trachea, bronchus and lung: Secondary | ICD-10-CM | POA: Diagnosis not present

## 2020-01-05 DIAGNOSIS — R29890 Loss of height: Secondary | ICD-10-CM | POA: Diagnosis not present

## 2020-01-05 DIAGNOSIS — C3492 Malignant neoplasm of unspecified part of left bronchus or lung: Secondary | ICD-10-CM | POA: Insufficient documentation

## 2020-01-05 DIAGNOSIS — S22000A Wedge compression fracture of unspecified thoracic vertebra, initial encounter for closed fracture: Secondary | ICD-10-CM | POA: Diagnosis not present

## 2020-01-05 DIAGNOSIS — M8588 Other specified disorders of bone density and structure, other site: Secondary | ICD-10-CM | POA: Diagnosis not present

## 2020-01-05 DIAGNOSIS — Z780 Asymptomatic menopausal state: Secondary | ICD-10-CM | POA: Diagnosis not present

## 2020-01-06 ENCOUNTER — Telehealth: Payer: Self-pay

## 2020-01-06 NOTE — Telephone Encounter (Signed)
-----   Message from [name], MD sent at 01/05/2020  9:32 PM EDT ----- Please let pt know that his bone density showed osteopenia, which is a condition that his bone is weaker than normal. This can be a side effect from chronic steroid use.  I recommend him to take calcium 1200mg  daily and vitamin D 1000 units daily. Advise him to discuss with primary care provider and endocrinologist for treatment of osteopenia. Thanks.

## 2020-01-06 NOTE — Telephone Encounter (Signed)
Patient and wife notified of results and voiced understanding. They wrote down dosage for medications.

## 2020-01-13 DIAGNOSIS — E039 Hypothyroidism, unspecified: Secondary | ICD-10-CM | POA: Diagnosis not present

## 2020-01-13 DIAGNOSIS — E2749 Other adrenocortical insufficiency: Secondary | ICD-10-CM | POA: Diagnosis not present

## 2020-01-18 IMAGING — US ULTRASOUND SCROTUM DOPPLER COMPLETE
1 series · 13 of 25 positions shown · non-contrast
Comparison: None

CLINICAL DATA: Testicular swelling for a few months

EXAM:
SCROTAL ULTRASOUND
DOPPLER ULTRASOUND OF THE TESTICLES
TECHNIQUE: Complete ultrasound examination of the testicles, epididymis, and
other scrotal structures was performed. Color and spectral Doppler
ultrasound were also utilized to evaluate blood flow to the
testicles.

[Series 1: ultrasound scrotum doppler complete · 86 acquisitions, 13 frames shown]
[im 1/86]
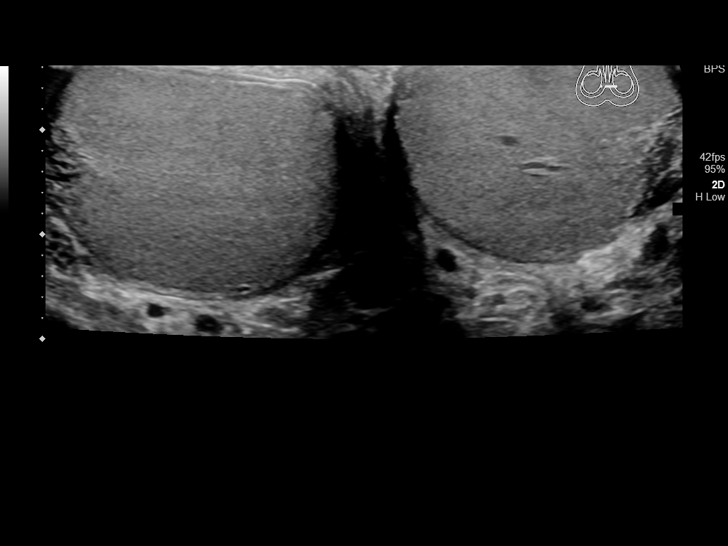
[im 8/86]
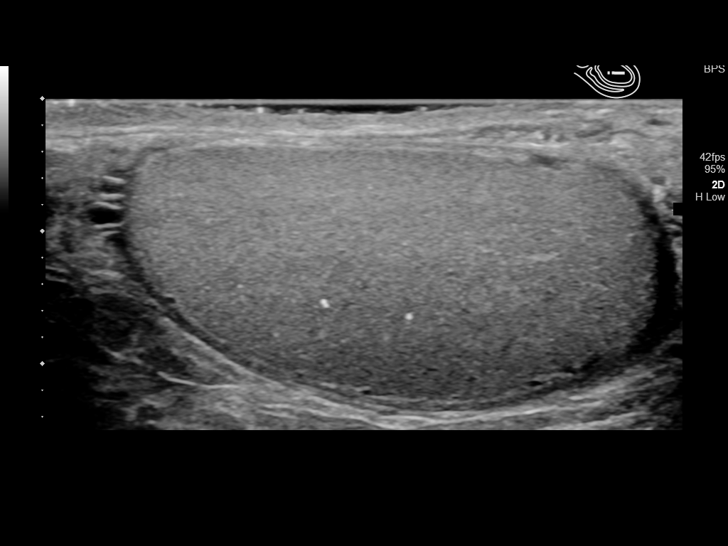
[im 15/86]
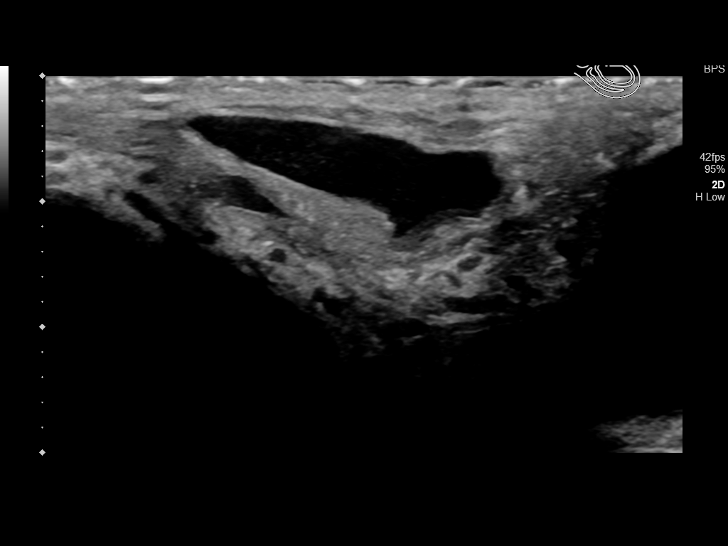
[im 22/86]
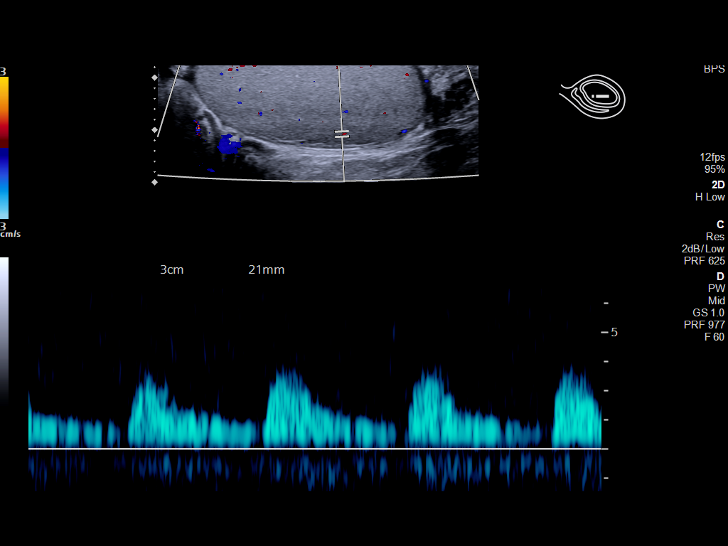
[im 29/86]
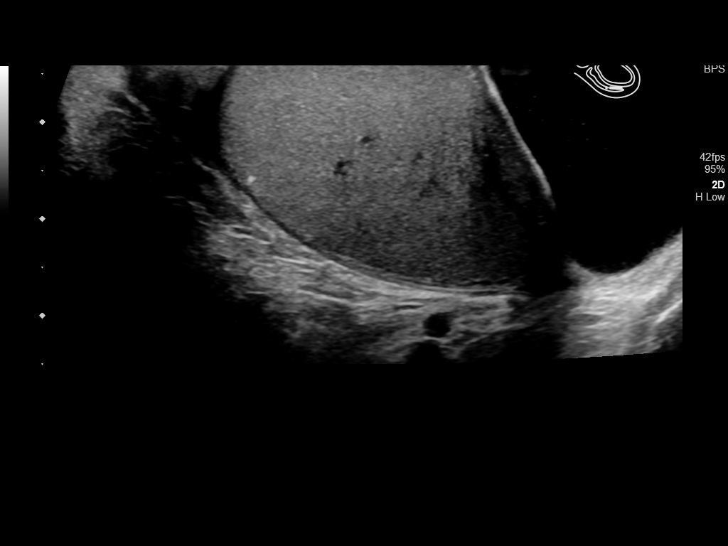
[im 36/86]
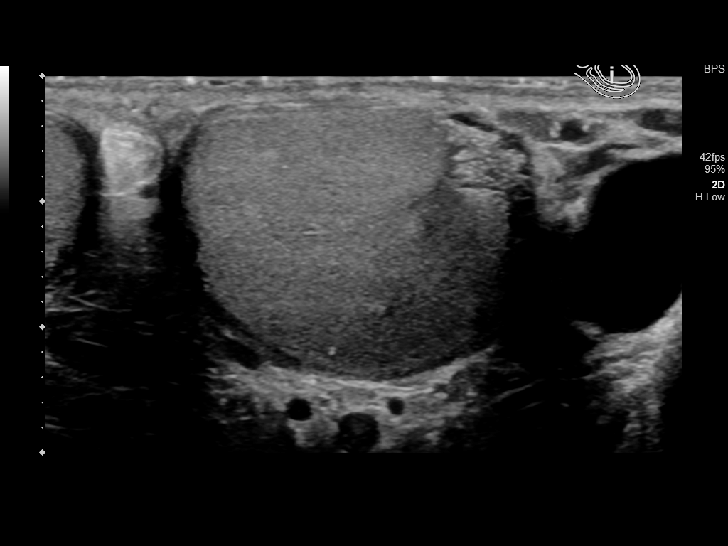
[im 43/86]
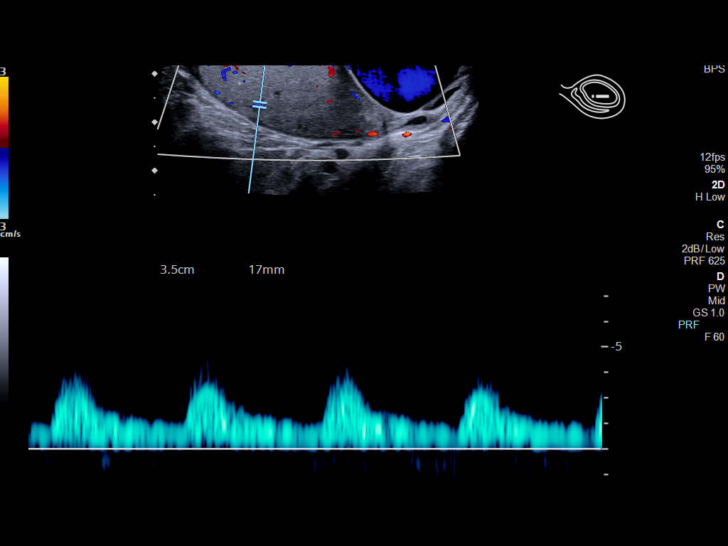
[im 50/86]
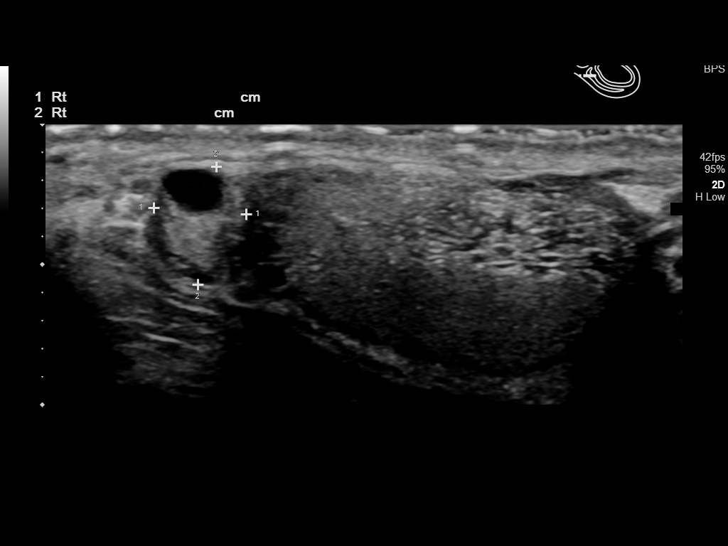
[im 57/86]
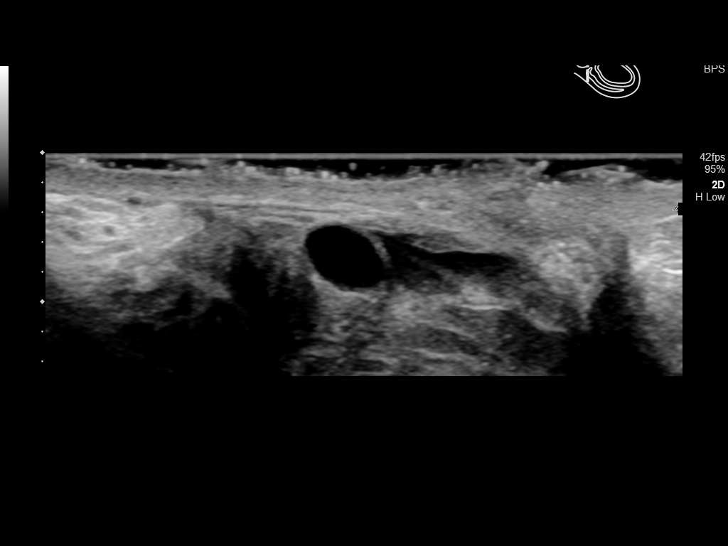
[im 64/86]
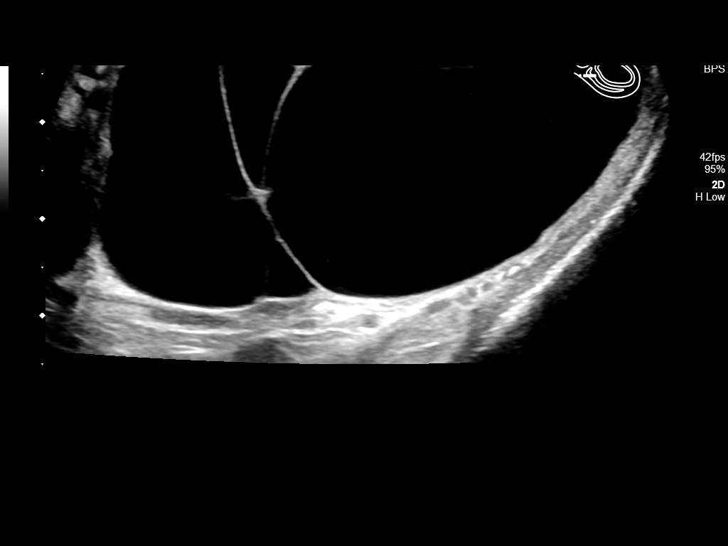
[im 71/86]
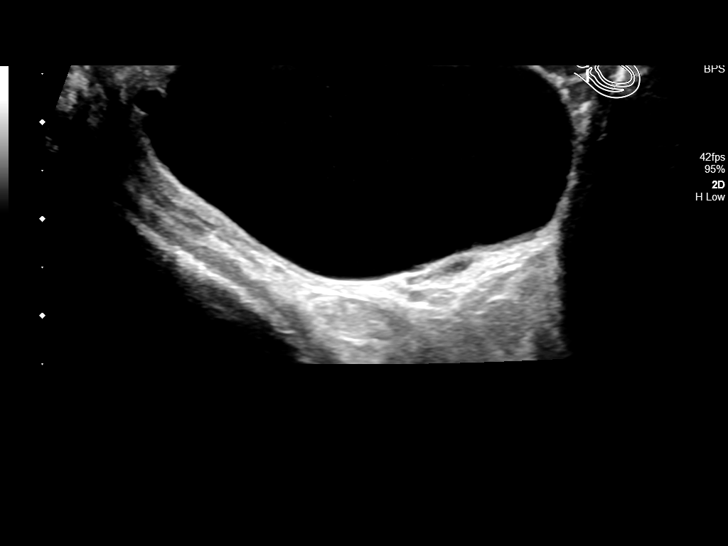
[im 78/86]
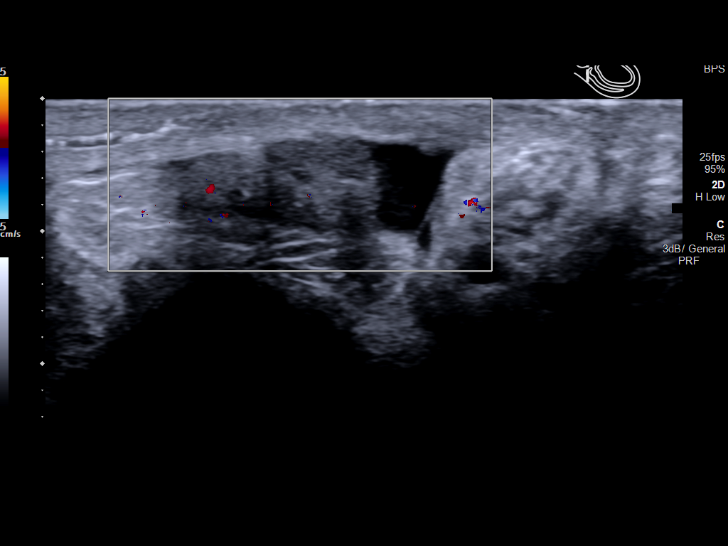
[im 86/86]
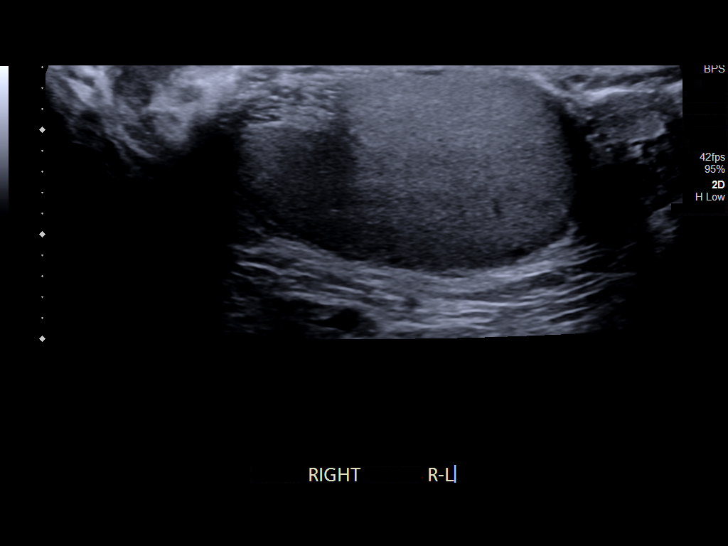

[13 of 25 positions shown; findings below may reference images not displayed]

FINDINGS: Right testicle

Measurements: 4.5 x 1.9 x 3.1 cm. Normal parenchymal echogenicity
without mass. Few tiny nonspecific microcalcifications. Internal
blood flow present on color Doppler imaging.

Left testicle

Measurements: 4.3 x 2.4 x 3.0 cm. Few tiny microcalcifications.
Small focus of tubular ectasia of the rete testis. Otherwise normal
parenchymal echogenicity without mass. Internal blood flow present
on color Doppler imaging, symmetric with RIGHT

Right epididymis:  Small cyst at RIGHT epididymal head 4 x 3 x 4 mm.

Left epididymis: Large septated cyst within LEFT epididymis
extending from head to tail, 6.3 x 2.4 x 4.5 cm, demonstrating
several thin septations.

Hydrocele:  None visualized.

Varicocele:  None visualized.

Pulsed Doppler interrogation of both testes demonstrates normal low
resistance arterial and venous waveforms bilaterally.
IMPRESSION: Small zone of tubular ectasia of the rete testis within LEFT testis.

Few scattered tiny nonspecific microcalcifications within both
testes.

Small RIGHT epididymal head cyst 4 mm diameter.

Large mildly complicated septated cyst involving the majority of the
LEFT epididymis, extending from head to tail, 6.3 x 2.4 x 4.5 cm,
question spermatocele versus complicated epididymal cyst.

## 2020-01-18 IMAGING — US US RENAL
1 series · 14 of 25 positions shown · non-contrast
Comparison: PET-CT 07/29/2018

CLINICAL DATA: BPH with obstruction, lower urinary tract symptoms

EXAM:
RENAL / URINARY TRACT ULTRASOUND COMPLETE

[Series 1: us renal · 56 acquisitions, 14 frames shown]
[im 1/56]
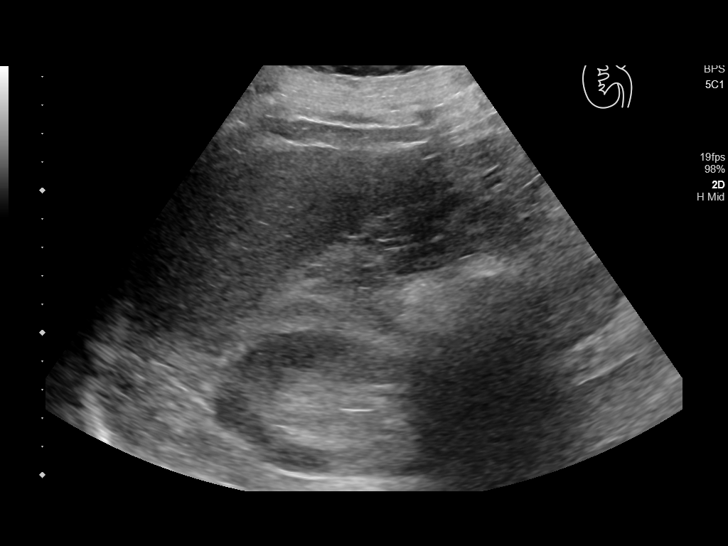
[im 5/56]
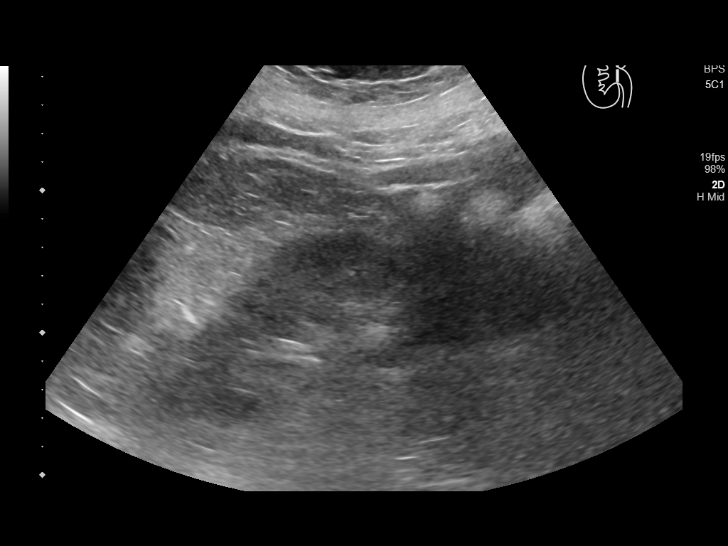
[im 10/56]
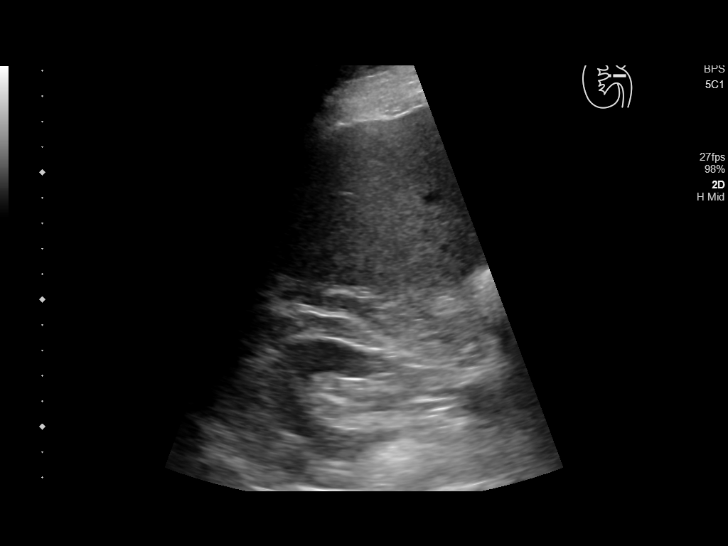
[im 14/56]
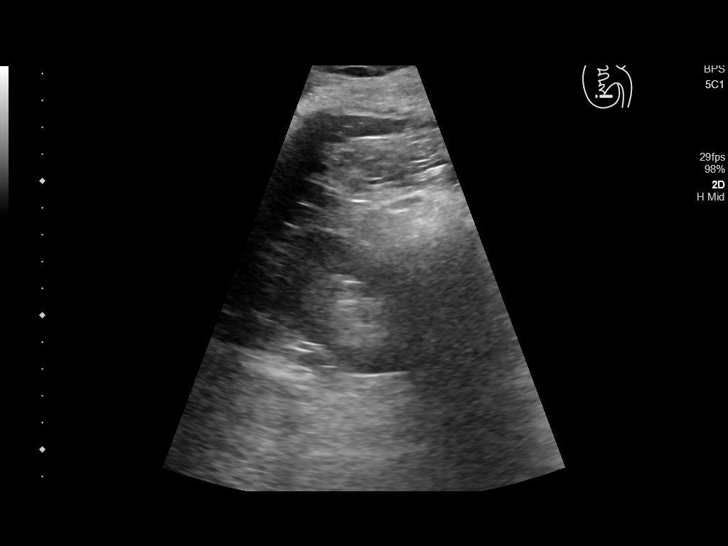
[im 19/56]
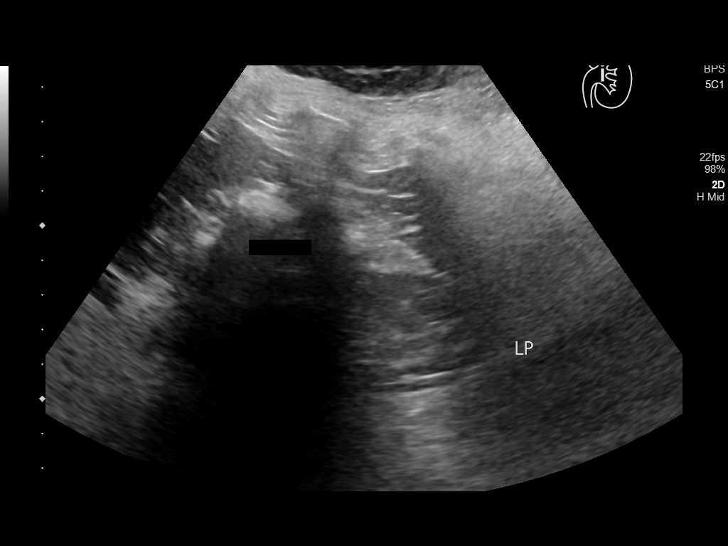
[im 21/56]
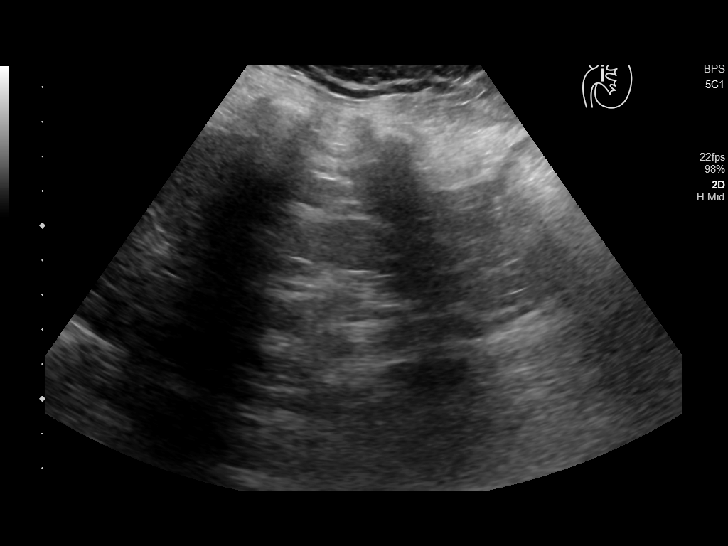
[im 26/56]
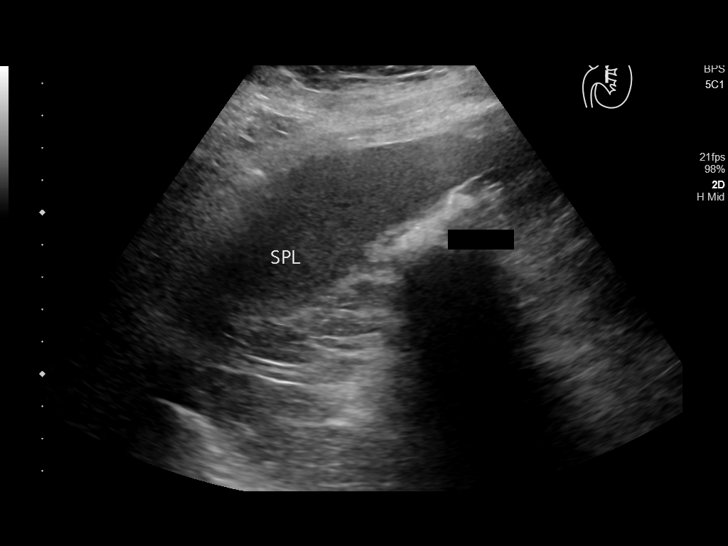
[im 30/56]
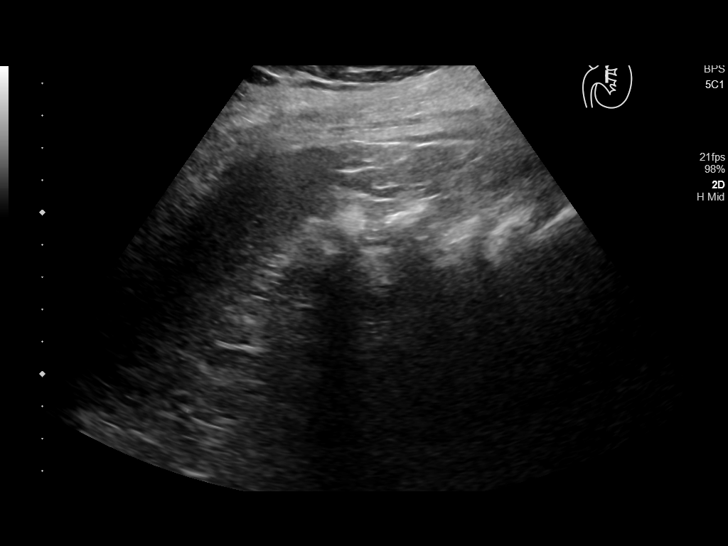
[im 35/56]
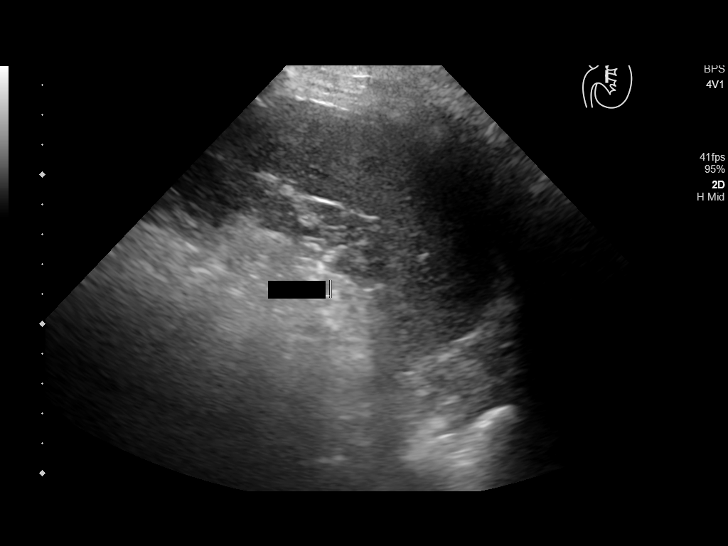
[im 37/56]
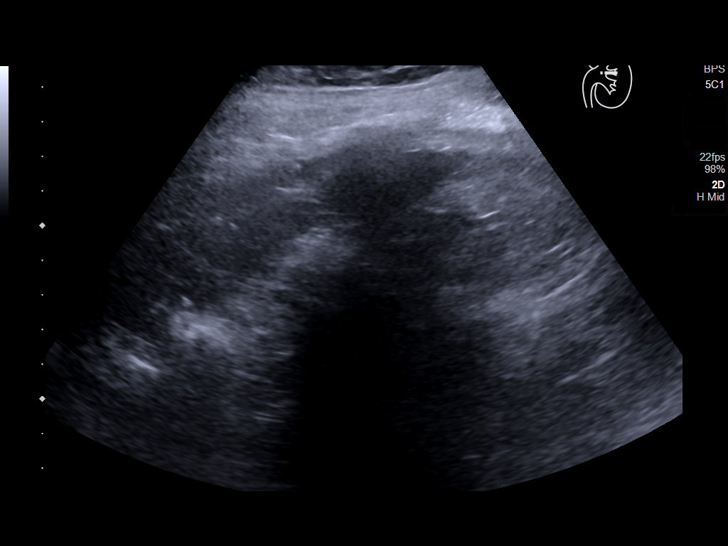
[im 42/56]
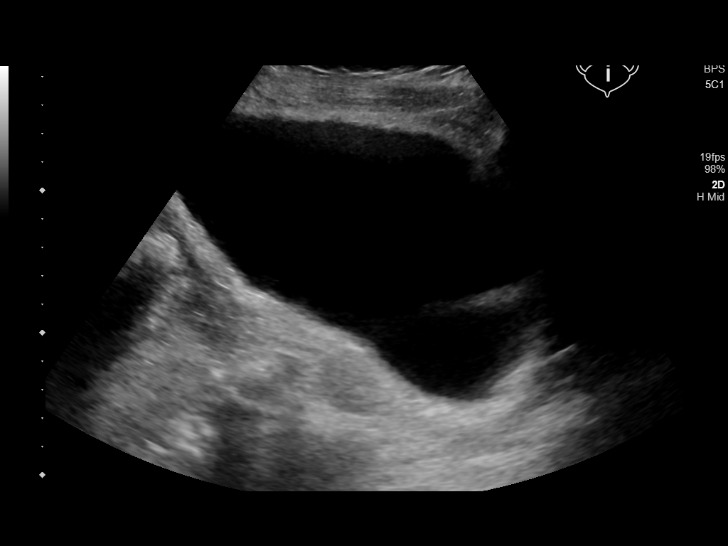
[im 46/56]
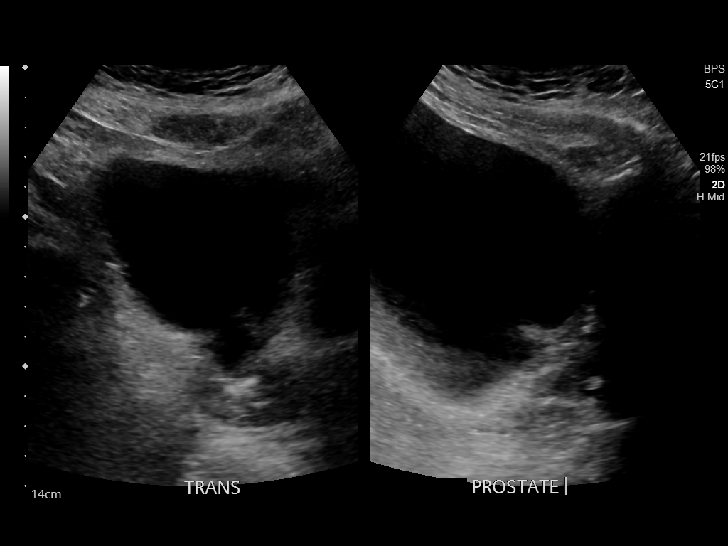
[im 51/56]
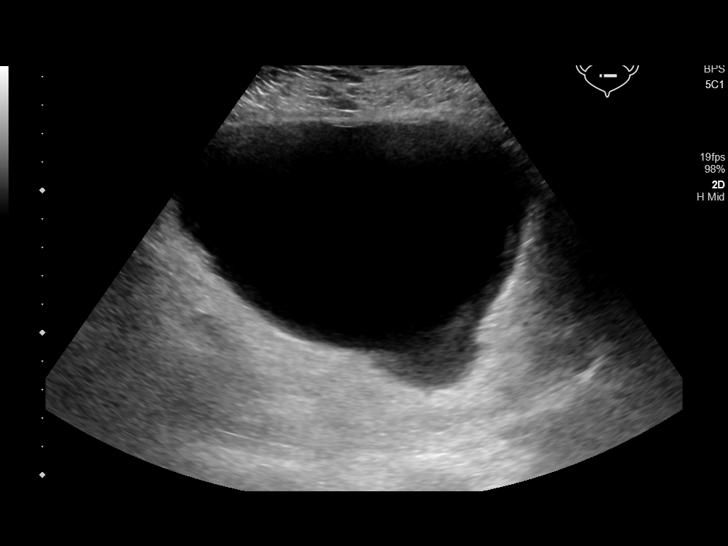
[im 56/56]
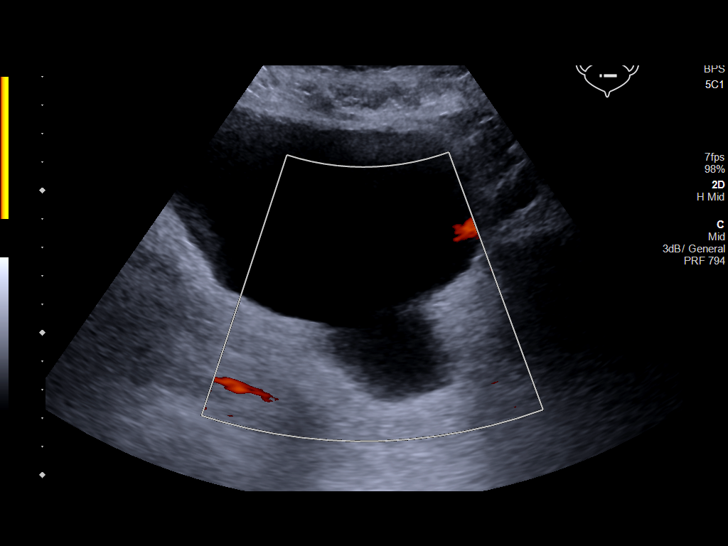

[14 of 25 positions shown; findings below may reference images not displayed]

FINDINGS: Right Kidney:

Renal measurements: 11.9 x 5.4 x 4.6 cm = volume: 155 mL. Mild
cortical thinning. Normal cortical echogenicity. No mass,
hydronephrosis or shadowing calcifications.

Left Kidney:

Inadequately visualized due to body habitus, bowel gas, and renal
atrophy. By prior PET-CT LEFT kidney is markedly atrophic and small.
No obvious mass or hydronephrosis seen at the LEFT renal fossa.
Renal parenchyma is inadequately visualized.

Bladder:

Well distended, normal appearance. Prostate gland measures 3.6 x
x 2.8 cm. Wide necked posterior bladder diverticulum as noted on
PET-CT.
IMPRESSION: Unremarkable RIGHT kidney.

Posterior bladder diverticulum.

Nonvisualization of known markedly atrophic LEFT kidney as above.

## 2020-01-19 DIAGNOSIS — E2749 Other adrenocortical insufficiency: Secondary | ICD-10-CM | POA: Diagnosis not present

## 2020-01-19 DIAGNOSIS — E039 Hypothyroidism, unspecified: Secondary | ICD-10-CM | POA: Diagnosis not present

## 2020-01-22 DIAGNOSIS — J439 Emphysema, unspecified: Secondary | ICD-10-CM | POA: Diagnosis not present

## 2020-01-30 ENCOUNTER — Other Ambulatory Visit: Payer: Self-pay

## 2020-01-30 ENCOUNTER — Inpatient Hospital Stay: Payer: PPO | Attending: Oncology

## 2020-01-30 DIAGNOSIS — Z9221 Personal history of antineoplastic chemotherapy: Secondary | ICD-10-CM | POA: Insufficient documentation

## 2020-01-30 DIAGNOSIS — C3492 Malignant neoplasm of unspecified part of left bronchus or lung: Secondary | ICD-10-CM | POA: Insufficient documentation

## 2020-01-30 DIAGNOSIS — Z452 Encounter for adjustment and management of vascular access device: Secondary | ICD-10-CM | POA: Diagnosis not present

## 2020-01-30 DIAGNOSIS — Z95828 Presence of other vascular implants and grafts: Secondary | ICD-10-CM

## 2020-01-30 MED ORDER — SODIUM CHLORIDE 0.9% FLUSH
10.0000 mL | INTRAVENOUS | Status: DC | PRN
  Administered 2020-01-30: 10 mL via INTRAVENOUS
  Filled 2020-01-30: qty 10

## 2020-01-30 MED ORDER — HEPARIN SOD (PORK) LOCK FLUSH 100 UNIT/ML IV SOLN
INTRAVENOUS | Status: AC
  Filled 2020-01-30: qty 5

## 2020-01-30 MED ORDER — HEPARIN SOD (PORK) LOCK FLUSH 100 UNIT/ML IV SOLN
500.0000 [IU] | Freq: Once | INTRAVENOUS | Status: AC
  Administered 2020-01-30: 500 [IU] via INTRAVENOUS
  Filled 2020-01-30: qty 5

## 2020-02-22 DIAGNOSIS — J439 Emphysema, unspecified: Secondary | ICD-10-CM | POA: Diagnosis not present

## 2020-02-23 ENCOUNTER — Ambulatory Visit
Admission: RE | Admit: 2020-02-23 | Discharge: 2020-02-23 | Disposition: A | Discharge status: Home / Self Care | Payer: PPO | Source: Ambulatory Visit | Attending: Oncology | Admitting: Oncology

## 2020-02-23 ENCOUNTER — Other Ambulatory Visit: Payer: Self-pay

## 2020-02-23 DIAGNOSIS — C3492 Malignant neoplasm of unspecified part of left bronchus or lung: Secondary | ICD-10-CM | POA: Insufficient documentation

## 2020-02-24 DIAGNOSIS — J9809 Other diseases of bronchus, not elsewhere classified: Secondary | ICD-10-CM | POA: Diagnosis not present

## 2020-02-24 DIAGNOSIS — Z0000 Encounter for general adult medical examination without abnormal findings: Secondary | ICD-10-CM | POA: Diagnosis not present

## 2020-02-24 DIAGNOSIS — R0602 Shortness of breath: Secondary | ICD-10-CM | POA: Diagnosis not present

## 2020-02-24 DIAGNOSIS — E039 Hypothyroidism, unspecified: Secondary | ICD-10-CM | POA: Diagnosis not present

## 2020-03-01 ENCOUNTER — Ambulatory Visit: Payer: PPO

## 2020-03-02 DIAGNOSIS — R0602 Shortness of breath: Secondary | ICD-10-CM | POA: Diagnosis not present

## 2020-03-02 DIAGNOSIS — Z0000 Encounter for general adult medical examination without abnormal findings: Secondary | ICD-10-CM | POA: Diagnosis not present

## 2020-03-02 DIAGNOSIS — Z23 Encounter for immunization: Secondary | ICD-10-CM | POA: Diagnosis not present

## 2020-03-02 DIAGNOSIS — D649 Anemia, unspecified: Secondary | ICD-10-CM | POA: Diagnosis not present

## 2020-03-04 ENCOUNTER — Inpatient Hospital Stay: Payer: PPO | Attending: Oncology | Admitting: Oncology

## 2020-03-04 ENCOUNTER — Inpatient Hospital Stay: Payer: PPO

## 2020-03-04 ENCOUNTER — Encounter: Payer: Self-pay | Admitting: Oncology

## 2020-03-04 ENCOUNTER — Other Ambulatory Visit: Payer: Self-pay

## 2020-03-04 VITALS — BP 162/89 | HR 65 | Temp 97.3°F | Wt 243.0 lb

## 2020-03-04 DIAGNOSIS — J449 Chronic obstructive pulmonary disease, unspecified: Secondary | ICD-10-CM | POA: Diagnosis not present

## 2020-03-04 DIAGNOSIS — Z923 Personal history of irradiation: Secondary | ICD-10-CM | POA: Diagnosis not present

## 2020-03-04 DIAGNOSIS — D472 Monoclonal gammopathy: Secondary | ICD-10-CM | POA: Diagnosis not present

## 2020-03-04 DIAGNOSIS — Z87891 Personal history of nicotine dependence: Secondary | ICD-10-CM | POA: Diagnosis not present

## 2020-03-04 DIAGNOSIS — Z7952 Long term (current) use of systemic steroids: Secondary | ICD-10-CM | POA: Insufficient documentation

## 2020-03-04 DIAGNOSIS — Z79899 Other long term (current) drug therapy: Secondary | ICD-10-CM | POA: Insufficient documentation

## 2020-03-04 DIAGNOSIS — I10 Essential (primary) hypertension: Secondary | ICD-10-CM | POA: Diagnosis not present

## 2020-03-04 DIAGNOSIS — E2740 Unspecified adrenocortical insufficiency: Secondary | ICD-10-CM | POA: Diagnosis not present

## 2020-03-04 DIAGNOSIS — I2510 Atherosclerotic heart disease of native coronary artery without angina pectoris: Secondary | ICD-10-CM | POA: Insufficient documentation

## 2020-03-04 DIAGNOSIS — Z452 Encounter for adjustment and management of vascular access device: Secondary | ICD-10-CM | POA: Diagnosis not present

## 2020-03-04 DIAGNOSIS — C61 Malignant neoplasm of prostate: Secondary | ICD-10-CM | POA: Diagnosis not present

## 2020-03-04 DIAGNOSIS — Z9221 Personal history of antineoplastic chemotherapy: Secondary | ICD-10-CM | POA: Insufficient documentation

## 2020-03-04 DIAGNOSIS — C3492 Malignant neoplasm of unspecified part of left bronchus or lung: Secondary | ICD-10-CM

## 2020-03-04 DIAGNOSIS — E032 Hypothyroidism due to medicaments and other exogenous substances: Secondary | ICD-10-CM

## 2020-03-04 DIAGNOSIS — E785 Hyperlipidemia, unspecified: Secondary | ICD-10-CM | POA: Insufficient documentation

## 2020-03-04 DIAGNOSIS — Z95828 Presence of other vascular implants and grafts: Secondary | ICD-10-CM

## 2020-03-04 DIAGNOSIS — R635 Abnormal weight gain: Secondary | ICD-10-CM

## 2020-03-04 DIAGNOSIS — S22000A Wedge compression fracture of unspecified thoracic vertebra, initial encounter for closed fracture: Secondary | ICD-10-CM

## 2020-03-04 LAB — COMPREHENSIVE METABOLIC PANEL
ALT: 25 U/L (ref 0–44)
AST: 19 U/L (ref 15–41)
Albumin: 3.3 g/dL — ABNORMAL LOW (ref 3.5–5.0)
Alkaline Phosphatase: 41 U/L (ref 38–126)
Anion gap: 9 (ref 5–15)
BUN: 21 mg/dL (ref 8–23)
CO2: 26 mmol/L (ref 22–32)
Calcium: 8.4 mg/dL — ABNORMAL LOW (ref 8.9–10.3)
Chloride: 105 mmol/L (ref 98–111)
Creatinine, Ser: 1.19 mg/dL (ref 0.61–1.24)
GFR calc non Af Amer: >60 mL/min (ref >60)
Glucose, Bld: 96 mg/dL (ref 70–99)
Potassium: 3.8 mmol/L (ref 3.5–5.1)
Sodium: 140 mmol/L (ref 135–145)
Total Bilirubin: 0.6 mg/dL (ref 0.3–1.2)
Total Protein: 6.4 g/dL — ABNORMAL LOW (ref 6.5–8.1)

## 2020-03-04 LAB — CBC WITH DIFFERENTIAL/PLATELET
Abs Immature Granulocytes: 0.05 10*3/uL (ref 0.00–0.07)
Basophils Absolute: 0 10*3/uL (ref 0.0–0.1)
Basophils Relative: 1 %
Eosinophils Absolute: 0.1 10*3/uL (ref 0.0–0.5)
Eosinophils Relative: 1 %
HCT: 39.3 % (ref 39.0–52.0)
Hemoglobin: 12.9 g/dL — ABNORMAL LOW (ref 13.0–17.0)
Immature Granulocytes: 1 %
Lymphocytes Relative: 11 %
Lymphs Abs: 0.7 10*3/uL (ref 0.7–4.0)
MCH: 30.4 pg (ref 26.0–34.0)
MCHC: 32.8 g/dL (ref 30.0–36.0)
MCV: 92.7 fL (ref 80.0–100.0)
Monocytes Absolute: 0.7 10*3/uL (ref 0.1–1.0)
Monocytes Relative: 10 %
Neutro Abs: 4.9 10*3/uL (ref 1.7–7.7)
Neutrophils Relative %: 76 %
Platelets: 169 10*3/uL (ref 150–400)
RBC: 4.24 MIL/uL (ref 4.22–5.81)
RDW: 15.6 % — ABNORMAL HIGH (ref 11.5–15.5)
WBC: 6.4 10*3/uL (ref 4.0–10.5)
nRBC: 0 % (ref 0.0–0.2)

## 2020-03-04 NOTE — Progress Notes (Signed)
Hematology/Oncology Follow Up Note [facility] [phone] [phone]   Patient Care Team: [name], MD as PCP - General (Family Medicine) [name], [name], MD (General Surgery) [name], MD as Consulting Physician (Oncology) [name], RN as Registered Nurse  Reason for Visit:  Follow up for lung cancer, immunotherapy side effects, adrenal insufficiency.  HISTORY OF PRESENTING ILLNESS:  Marc Gray 74 y.o.  male who presents for treatment of Stage IIIB (cT2b, cN3, cM0)  Adenocarcinoma of lung.  # Patient is s/p [place] and taxol concurrent RT for treatment of Stage IIIB Lung cancer. He recieved additional lung boost RT finished one year of Durvalumab -January 2020  # Colon CA was listed in his history, however reviewing his surgical pathology from colonoscopy on 04/15/2014, during which he had polyps removed and all are negative for cancer.patient is scheduled for colonoscopy screening this year.  # 5/1/ 2019 CT scan was discussed at tumor board and changes consistent with radiation changes  # TURP procedure done on 02/14/2019.  Pathology showed prostate adenocarcinoma, Gleason score 3+3, tumor quantitation, estimated percentage of prostate tissue involved by tumor less than 5%. No perineural invasion identified. Patient follows up with urologist and was recommended to observe.  INTERVAL HISTORY Patient he presents for follow-up of fo stage IIIB adenocarcinoma of the lung. He has chronic cough, with yellowish sputum. He was seen by Dr.Hedrick. per patient he had "test of the mucus" 3 weeks ago. Denies weight loss, fever, chills, fatigue, night sweats.   On Steroid for adrenal insufficiency. Patient is on Synthroid for hypothyroidism.   Review of Systems  Constitutional: Negative for appetite change, chills, fatigue, fever and unexpected weight change.  HENT:   Negative for hearing loss and voice change.   Eyes:  Negative for eye problems and icterus.  Respiratory: Positive for cough and shortness of breath. Negative for chest tightness.   Cardiovascular: Negative for chest pain and leg swelling.  Gastrointestinal: Negative for abdominal distention, abdominal pain and blood in stool.  Endocrine: Negative for hot flashes.  Genitourinary: Negative for difficulty urinating, dysuria and frequency.   Musculoskeletal: Negative for arthralgias and back pain.  Skin: Negative for itching and rash.  Neurological: Negative for extremity weakness, light-headedness and numbness.  Hematological: Negative for adenopathy. Does not bruise/bleed easily.  Psychiatric/Behavioral: Negative for confusion.    MEDICAL HISTORY:  Past Medical History:  Diagnosis Date  . Anxiety   . Aortic atherosclerosis ([place])   . Arthritis    SHOULDER  . BPH (benign prostatic hyperplasia)   . Cataract   . Chronic kidney disease    low kidney function  . Colon adenomas   . Colon cancer ([place]) 2015   Pt states during his colonoscopy he had cancerous polyps removed.   [name] COPD (chronic obstructive pulmonary disease) ([place])   . Depression    SINCE DIAGNOSIS  . Dyspnea    DOE  . Enlarged prostate   . GERD (gastroesophageal reflux disease)   . Hypertension   . Hypothyroidism   . Lung cancer [facility])    Aug 2018  . Pain    DUE TO LUNG ISSUE    SURGICAL HISTORY: Past Surgical History:  Procedure Laterality Date  . BACK SURGERY     kyphoplasty  T5-6  . CATARACT EXTRACTION W/ INTRAOCULAR LENS  IMPLANT, BILATERAL    . COLONOSCOPY    . COLONOSCOPY WITH PROPOFOL N/A 12/17/2019   Procedure: COLONOSCOPY WITH PROPOFOL;  Surgeon: Robert Bellow, MD;  Location: [facility] END[facility]COPY;  Service: Endoscopy;  Laterality: N/A;  . EYE SURGERY Bilateral    cataract  . LYMPH GLAND EXCISION N/A 02/09/2017   Procedure: CERVICAL LYMPH NODE BIOPSY;  Surgeon: Robert Bellow, MD;  Location: [facility];  Service: General;  Laterality: N/A;  . PORTACATH  PLACEMENT Right 02/23/2017   Procedure: INSERTION PORT-A-CATH;  Surgeon: Robert Bellow, MD;  Location: [facility];  Service: General;  Laterality: Right;  . PROSTATE SURGERY    . TONSILLECTOMY    . TRANSURETHRAL RESECTION OF PROSTATE N/A 02/14/2019   Procedure: TRANSURETHRAL RESECTION OF THE PROSTATE (TURP);  Surgeon: [name], MD;  Location: [facility];  Service: Urology;  Laterality: N/A;    SOCIAL HISTORY: Social History   Socioeconomic History  . Marital status: Married    Spouse name: Not on file  . Number of children: Not on file  . Years of education: Not on file  . Highest education level: Not on file  Occupational History  . Not on file  Tobacco Use  . Smoking status: Former Smoker    Packs/day: 1.00    Years: 53.00    Pack years: 53.00    Types: Cigarettes    Quit date: 04/14/2017    Years since quitting: 2.8  . Smokeless tobacco: Never Used  . Tobacco comment: Quit November 2018  Vaping Use  . Vaping Use: Never used  Substance and Sexual Activity  . Alcohol use: Not Currently    Comment: occassional  . Drug use: No  . Sexual activity: Yes  Other Topics Concern  . Not on file  Social History Narrative  . Not on file   Social Determinants of Health   Financial Resource Strain:   . Difficulty of Paying Living Expenses: Not on file  Food Insecurity:   . Worried About Charity fundraiser in the Last Year: Not on file  . Ran Out of Food in the Last Year: Not on file  Transportation Needs:   . Lack of Transportation (Medical): Not on file  . Lack of Transportation (Non-Medical): Not on file  Physical Activity:   . Days of Exercise per Week: Not on file  . Minutes of Exercise per Session: Not on file  Stress:   . Feeling of Stress : Not on file  Social Connections:   . Frequency of Communication with Friends and Family: Not on file  . Frequency of Social Gatherings with Friends and Family: Not on file  . Attends Religious Services: Not on file  .  Active Member of Clubs or Organizations: Not on file  . Attends Archivist Meetings: Not on file  . Marital Status: Not on file  Intimate Partner Violence:   . Fear of Current or Ex-Partner: Not on file  . Emotionally Abused: Not on file  . Physically Abused: Not on file  . Sexually Abused: Not on file    FAMILY HISTORY: Family History  Problem Relation Age of Onset  . Breast cancer Maternal Grandmother   . Dementia Mother   . Diabetes Father   . Stroke Father     ALLERGIES:  is allergic to morphine and related.  MEDICATIONS:  Current Outpatient Medications  Medication Sig Dispense Refill  . acetaminophen (TYLENOL) 500 MG tablet Take 1,000 mg by mouth every 6 (six) hours as needed for moderate pain.    . Azelastine HCl 137 MCG/SPRAY SOLN SMARTSIG:1-2 Spray(s) Both Nares Twice Daily    . BREZTRI AEROSPHERE 160-9-4.8 MCG/ACT  AERO Inhale 2 puffs into the lungs 2 (two) times daily.    . budesonide (PULMICORT) 0.5 MG/2ML nebulizer solution Take by nebulization 2 (two) times daily.    . calcium-vitamin D (OSCAL WITH D) 500-200 MG-UNIT tablet Take 1 tablet by mouth daily. (Patient taking differently: Take 2 tablets by mouth daily. ) 90 tablet 1  . Chlorpheniramine-Phenylephrine (SINUS & ALLERGY) 4-10 MG tablet Take 1 tablet by mouth every 6 (six) hours as needed for allergies.     . diphenhydrAMINE-zinc acetate (BENADRYL EXTRA STRENGTH) cream Apply 1 application topically 3 (three) times daily as needed for itching. 28 g 0  . fluticasone (FLONASE) 50 MCG/ACT nasal spray Place 1 spray into both nostrils daily. 16 g 2  . Ipratropium-Albuterol (COMBIVENT) 20-100 MCG/ACT AERS respimat Inhale into the lungs.    [name] ipratropium-albuterol (DUONEB) 0.5-2.5 (3) MG/3ML SOLN Inhale into the lungs.    [name] levothyroxine (SYNTHROID) 150 MCG tablet Take 150 mcg by mouth daily before breakfast.    . lidocaine-prilocaine (EMLA) cream Apply over port 1-2 hours prior to chemotherapy treatment.  Cover  with plastic wrap. 30 g 1  . Melatonin 10 MG TABS Take 10 mg by mouth at bedtime.     . Multiple Vitamin (MULTI-VITAMINS) TABS Take 1 tablet by mouth daily.     . pantoprazole (PROTONIX) 40 MG tablet Take 1 tablet (40 mg total) by mouth daily. 90 tablet 0  . predniSONE (DELTASONE) 10 MG tablet Take one tablet each morning. Take one-half tablet each afternoon (take between 2 - 5 PM) (Patient taking differently: Take 5-10 mg by mouth See admin instructions. Take 10 mg by mouth in the morning and 5 mg in the late afternoon (between 2 - 5 PM)) 45 tablet 0  . prochlorperazine (COMPAZINE) 10 MG tablet Take 1 tablet (10 mg total) by mouth every 6 (six) hours as needed for nausea or vomiting. 60 tablet 2  . sertraline (ZOLOFT) 25 MG tablet TAKE 1 TABLET(25 MG) BY MOUTH DAILY 90 tablet 0  . tadalafil (CIALIS) 5 MG tablet Take 1 tablet (5 mg total) by mouth daily as needed for erectile dysfunction. 30 tablet 11  . tamsulosin (FLOMAX) 0.4 MG CAPS capsule Take 1 capsule (0.4 mg total) by mouth daily. 90 capsule 1  . triamcinolone ointment (KENALOG) 0.5 % Apply 1 application topically 3 (three) times daily as needed. 30 g 1  . aspirin 325 MG tablet Take 650 mg by mouth daily.  (Patient not taking: Reported on 11/07/2019)    . budesonide-formoterol (SYMBICORT) 160-4.5 MCG/ACT inhaler Inhale 2 puffs into the lungs 2 (two) times daily. Must contact PCP for future refills (Patient not taking: Reported on 03/04/2020) 1 Inhaler 0  . ondansetron (ZOFRAN) 8 MG tablet Take 1 tablet (8 mg total) by mouth every 12 (twelve) hours as needed for nausea or vomiting. (Patient not taking: Reported on 03/04/2020) 60 tablet 0  . polyethylene glycol powder (GLYCOLAX/MIRALAX) 17 GM/SCOOP powder TAKE AS DIRECTED FOR COLONIC PREP (Patient not taking: Reported on 03/04/2020)    . sulfamethoxazole-trimethoprim (BACTRIM) 400-80 MG tablet SMARTSIG:1 Tablet(s) By Mouth Every 12 Hours (Patient not taking: Reported on 03/04/2020)    . traMADol  (ULTRAM) 50 MG tablet Take 1 tablet (50 mg total) by mouth every 6 (six) hours as needed for moderate pain or severe pain. (Patient not taking: Reported on 03/04/2020) 30 tablet 0   No current facility-administered medications for this visit.   Facility-Administered Medications Ordered in Other Visits  Medication Dose Route Frequency  Provider Last Rate Last Admin  . sodium chloride flush (NS) 0.9 % injection 10 mL  10 mL Intravenous PRN [name], MD   10 mL at 04/03/17 5056      .  PHYSICAL EXAMINATION: ECOG PERFORMANCE STATUS: 1 - Symptomatic but completely ambulatory Vitals:   03/04/20 1107  BP: (!) 162/89  Pulse: 65  Temp: (!) 97.3 F (36.3 C)  SpO2: 97%   Filed Weights   03/04/20 1107  Weight: 243 lb (110.2 kg)  Physical Exam Constitutional:      General: He is not in acute distress.    Appearance: He is not diaphoretic.  HENT:     Head: Normocephalic and atraumatic.     Nose: Nose normal.     Mouth/Throat:     Pharynx: No oropharyngeal exudate.  Eyes:     General: No scleral icterus.    Pupils: Pupils are equal, round, and reactive to light.  Cardiovascular:     Rate and Rhythm: Normal rate and regular rhythm.     Heart sounds: No murmur heard.   Pulmonary:     Effort: Pulmonary effort is normal. No respiratory distress.     Breath sounds: No rales.  Chest:     Chest wall: No tenderness.  Abdominal:     General: There is no distension.     Palpations: Abdomen is soft.     Tenderness: There is no abdominal tenderness.  Musculoskeletal:        General: Normal range of motion.     Cervical back: Normal range of motion and neck supple.  Skin:    General: Skin is warm and dry.     Findings: No erythema.  Neurological:     Mental Status: He is alert and oriented to person, place, and time.     Cranial Nerves: No cranial nerve deficit.     Motor: No abnormal muscle tone.     Coordination: Coordination normal.  Psychiatric:        Mood and Affect: Affect  normal.    LABORATORY DATA:  I have reviewed the data as listed Lab Results  Component Value Date   WBC 6.4 03/04/2020   HGB 12.9 (L) 03/04/2020   HCT 39.3 03/04/2020   MCV 92.7 03/04/2020   PLT 169 03/04/2020   Recent Labs    05/09/19 1349 05/09/19 1349 08/06/19 1304 11/07/19 1250 03/04/20 1034  NA 139   < > 137 140 140  K 3.8   < > 4.6 4.1 3.8  CL 105   < > 103 106 105  CO2 26   < > _0 GLUCOSE 97   < > 108* 115* 96  BUN 22   < > 21 24* 21  CREATININE 1.32*   < > 1.19 1.36* 1.19  CALCIUM 8.6*   < > 8.8* 8.8* 8.4*  GFRNONAA 54*   < > >60 52* >60  GFRAA >60  --  >60 60*  --   PROT 6.4*   < > 6.6 6.8 6.4*  ALBUMIN 3.4*   < > 3.6 3.7 3.3*  AST 17   < > _1 ALT 23   < > _2 ALKPHOS 61   < > 60 52 41  BILITOT 0.7   < > 0.6 0.7 0.6   < > = values in this interval not displayed.  RADIOGRAPHIC STUDIES: I have personally reviewed the radiological images as listed and agreed with the  findings in the report. CT Chest Wo Contrast  Result Date: 02/23/2020 CLINICAL DATA:  Follow-up left lung cancer, status post chemotherapy, radiation, immunotherapy EXAM: CT CHEST WITHOUT CONTRAST TECHNIQUE: Multidetector CT imaging of the chest was performed following the standard protocol without IV contrast. COMPARISON:  11/04/2019 FINDINGS: Cardiovascular: Right chest port catheter. Aortic atherosclerosis. Normal heart size. Three-vessel coronary artery calcifications. No pericardial effusion. Mediastinum/Nodes: Unchanged post treatment appearance of soft tissue about the left hilum. No discretely enlarged mediastinal, hilar, or axillary lymph nodes. Thyroid gland, trachea, and esophagus demonstrate no significant findings. Lungs/Pleura: Unchanged post treatment of the left chest with dense fibrotic consolidation and volume loss of the perihilar and suprahilar left lung. 4 mm pulmonary nodule of the right lower lobe (series 3, image 82). Moderate centrilobular emphysema. Diffuse  bilateral bronchial wall thickening. No pleural effusion or pneumothorax. Upper Abdomen: No acute abnormality. Very atrophic left kidney, partially imaged. Musculoskeletal: No chest wall mass or suspicious bone lesions identified. Redemonstrated superior endplate deformity of the T2 vertebral body. Redemonstrated kyphoplasty of T5 and T7. IMPRESSION: 1. Unchanged post treatment appearance of the left chest with dense fibrotic consolidation and volume loss of the perihilar and suprahilar left lung. No evidence of recurrent or metastatic disease in the chest. 2. Stable 4 mm pulmonary nodule of the right lower lobe. Attention on follow-up. 3. Emphysema and diffuse bilateral bronchial wall thickening. Emphysema (ICD10-J43.9). 4. Coronary artery disease.  Aortic Atherosclerosis (ICD10-I70.0). Electronically Signed   By: Eddie Candle M.D.   On: 02/23/2020 11:01   DG Bone Density  Result Date: 01/05/2020 EXAM: DUAL X-RAY ABSORPTIOMETRY (DXA) FOR BONE MINERAL DENSITY IMPRESSION: crr Your patient Nadia Torr completed a BMD test on 01/05/2020 using the [place] (analysis version: 14.10) manufactured by [employer]. The following summarizes the results of our evaluation. PATIENT BIOGRAPHICAL: Name: Yanni, Quiroa Patient ID: [id number] Birth Date: June 29, 1945 Height: 71.5 in. Gender: Male Exam Date: 01/05/2020 Weight: 235.4 lbs. Indications: Advanced Age, Caucasian, copd, Family History of Fracture, Height Loss, History of Fracture (Adult), hx lung ca, hypothyroid, Family Hist. (Parent hip fracture), Glucocorticoids (Chronic) Fractures: Spine Treatments: calcium /vit d, Combivent, levothyroxine, multivitamin, prednisone, pulmicort, SPIRIVA ASSESSMENT: The BMD measured at AP Spine L1-L2 is 1.070 g/cm2 with a T-score of -1.1. This patient is considered osteopenic according to [facility]) criteria. L-3&4 were excluded due to degenerative changes.The scan quality is  limited by exclusion of L-3&4. Site Region Measured Measured WHO Young Adult BMD Date       Age      Classification T-score AP Spine L1-L2 01/05/[age] 72.9 Osteopenia -1.1 1.070 g/cm2 AP Spine L1-L2 12/31/2017 70.9 Osteopenia -1.3 1.038 g/cm2 DualFemur Total Right 01/05/2020 72.9 Normal -1.0 0.956 g/cm2 DualFemur Total Right 12/31/2017 70.9 Normal -0.7 0.996 g/cm2 DualFemur Total Mean 01/05/2020 72.9 Normal -0.9 0.965 g/cm2 DualFemur Total Mean 12/31/2017 70.9 Normal -0.7 1.003 g/cm2 [facility]) criteria for post-menopausal, Caucasian Women: Normal:       T-score at or above -1 SD Osteopenia:   T-score between -1 and -2.5 SD Osteoporosis: T-score at or below -2.5 SD RECOMMENDATIONS: 1. All patients should optimize calcium and vitamin D intake. 2. Consider FDA-approved medical therapies in postmenopausal women and men aged 57 years and older, based on the following: a. A hip or vertebral (clinical or morphometric) fracture b. T-score < -2.5 at the femoral neck or spine after appropriate evaluation to exclude secondary causes c. Low bone mass (T-score between -1.0 and -2.5 at the  femoral neck or spine) and a 10-year probability of a hip fracture > 3% or a 10-year probability of a major osteoporosis-related fracture > 20% based on the [place]-adapted WHO algorithm d. Clinician judgment and/or patient preferences may indicate treatment for people with 10-year fracture probabilities above or below these levels FOLLOW-UP: People with diagnosed cases of osteoporosis or osteopenia should be regularly tested for bone mineral density. For patients eligible for Medicare, routine testing is allowed once every 2 years. The testing frequency can be increased to one year for patients who have rapidly progressing disease, or for those who are receiving medical therapy to restore bone mass. I have reviewed this report, and agree with the above findings. [facility] crr Your patient SMITTY ACKERLEY  completed a FRAX assessment on 01/05/2020 using the [place] (analysis version: 14.10) manufactured by [employer]. The following summarizes the results of our evaluation. PATIENT BIOGRAPHICAL: Name: Braxdon, Gappa Patient ID: [id number] Birth Date: 07-Oct-1945 Height:    71.5 in. Gender:     Male      [age]       Weight:    235.4 lbs. Ethnicity:  White                            Exam Date: 01/05/2020 FRAX* RESULTS:  (version: 3.5) 10-year Probability of Fracture1 Major Osteoporotic Fracture2 Hip Fracture 18.4% 6.9% Population: [place] (Caucasian) Risk Factors: History of Fracture (Adult), Family Hist. (Parent hip fracture), Glucocorticoids (Chronic) Based on Femur (Right) Neck BMD 1 -The 10-year probability of fracture may be lower than reported if the patient has received treatment. 2 -Major Osteoporotic Fracture: Clinical Spine, Forearm, Hip or Shoulder *FRAX is a Materials engineer of the [employer] for Metabolic Bone Disease, a [place] (WHO) [facility]. ASSESSMENT: The probability of a major osteoporotic fracture is 18.4% within the next ten years. The probability of a hip fracture is 6.9% within the next ten years. Electronically Signed   By: Lowella Grip Gray M.D.   On: 01/05/2020 11:59   ASSESSMENT & PLAN:  Cancer Staging Adenocarcinoma of left lung, stage 3 (HCC) Staging form: Lung, AJCC 8th Edition - Clinical stage from 02/14/2017: Stage IIIB (cT2b, cN3, cM0) - Signed by [name], MD on 02/14/2017  1. Adenocarcinoma of left lung, stage 3 ([place])   2. MGUS (monoclonal gammopathy of unknown significance)   3. Adrenal insufficiency ([place])   4. Hypothyroidism due to medication   5. Prostate cancer ([place])    #Stage IIIB lung adenocarcinoma, s/p concurrent chemo/RT and immunotherapy, finished all treatment in January 2020 CT images were reviewed and discussed with him.  Stable, no recurrence or  progression.  Continue CT surveillance every 6 months.   #Hypothyroidism and adrenal insufficiency.  Managed by Endocrinology Chronic steroid use, osteopenia. Recommend calcium and Vitamin D  #Weight gain, secondary to chronic steroid use.  Lifestyle modification discussed with patient.  # MGUS,  multiple myeloma panel is stable.  Check multiple myeloma panel at next visit. .. #CKD, avoid NSAIDs. #Prostate adenocarcinoma, Gleason score 6, currently on observation.  Last PSA 09/01/2019 1.5. follow up with urology #Port-A-Cath in place, we discussed about option of port discontinuation.  Patient prefers to keep the port.  Continue port flush every 8 weeks.  Repeat CT scan in 6 months and follow-up in the clinic. Orders Placed This Encounter  Procedures  .  CT CHEST WO CONTRAST    Standing Status:   Future    Standing Expiration Date:   03/04/2021    Order Specific Question:   Preferred imaging location?    Answer:   [facility]    Order Specific Question:   Radiology Contrast Protocol - do NOT remove file path    Answer:   [email or web address]  . Comprehensive metabolic panel    Standing Status:   Future    Standing Expiration Date:   03/04/2021  . CBC with Differential/Platelet    Standing Status:   Future    Standing Expiration Date:   03/04/2021  . Multiple Myeloma Panel (SPEP&IFE w/QIG)    Standing Status:   Future    Standing Expiration Date:   03/04/2021  . Kappa/lambda light chains    Standing Status:   Future    Standing Expiration Date:   03/04/2021   Follow up in 6 months, repeat CT chest surveillance scan.   [name], MD, PhD  03/04/2020

## 2020-03-04 NOTE — Progress Notes (Signed)
Patient reports having a cough for the past month with chronic SOBr.  Does f/u with pulmonologist and they sent a mucus specimen for testing.

## 2020-03-05 LAB — KAPPA/LAMBDA LIGHT CHAINS
Kappa free light chain: 46.6 mg/L — ABNORMAL HIGH (ref 3.3–19.4)
Kappa, lambda light chain ratio: 1.9 — ABNORMAL HIGH (ref 0.26–1.65)
Lambda free light chains: 24.5 mg/L (ref 5.7–26.3)

## 2020-03-08 LAB — MULTIPLE MYELOMA PANEL, SERUM
Albumin SerPl Elph-Mcnc: 3.2 g/dL (ref 2.9–4.4)
Albumin/Glob SerPl: 1.2 (ref 0.7–1.7)
Alpha 1: 0.2 g/dL (ref 0.0–0.4)
Alpha2 Glob SerPl Elph-Mcnc: 0.7 g/dL (ref 0.4–1.0)
B-Globulin SerPl Elph-Mcnc: 1 g/dL (ref 0.7–1.3)
Gamma Glob SerPl Elph-Mcnc: 0.7 g/dL (ref 0.4–1.8)
Globulin, Total: 2.7 g/dL (ref 2.2–3.9)
IgA: 269 mg/dL (ref 61–437)
IgG (Immunoglobin G), Serum: 790 mg/dL (ref 603–1613)
IgM (Immunoglobulin M), Srm: 84 mg/dL (ref 15–143)
M Protein SerPl Elph-Mcnc: 0.3 g/dL — ABNORMAL HIGH
Total Protein ELP: 5.9 g/dL — ABNORMAL LOW (ref 6.0–8.5)

## 2020-03-19 ENCOUNTER — Other Ambulatory Visit: Payer: Self-pay

## 2020-03-19 ENCOUNTER — Inpatient Hospital Stay: Payer: PPO

## 2020-03-19 DIAGNOSIS — Z95828 Presence of other vascular implants and grafts: Secondary | ICD-10-CM

## 2020-03-19 DIAGNOSIS — C3492 Malignant neoplasm of unspecified part of left bronchus or lung: Secondary | ICD-10-CM | POA: Diagnosis not present

## 2020-03-19 MED ORDER — SODIUM CHLORIDE 0.9% FLUSH
10.0000 mL | Freq: Once | INTRAVENOUS | Status: AC
  Administered 2020-03-19: 10 mL via INTRAVENOUS
  Filled 2020-03-19: qty 10

## 2020-03-19 MED ORDER — HEPARIN SOD (PORK) LOCK FLUSH 100 UNIT/ML IV SOLN
500.0000 [IU] | Freq: Once | INTRAVENOUS | Status: AC
  Administered 2020-03-19: 500 [IU] via INTRAVENOUS
  Filled 2020-03-19: qty 5

## 2020-03-23 DIAGNOSIS — J439 Emphysema, unspecified: Secondary | ICD-10-CM | POA: Diagnosis not present

## 2020-04-23 DIAGNOSIS — J439 Emphysema, unspecified: Secondary | ICD-10-CM | POA: Diagnosis not present

## 2020-05-23 DIAGNOSIS — J439 Emphysema, unspecified: Secondary | ICD-10-CM | POA: Diagnosis not present

## 2020-06-04 ENCOUNTER — Inpatient Hospital Stay: Payer: PPO | Attending: Oncology

## 2020-06-04 DIAGNOSIS — C3492 Malignant neoplasm of unspecified part of left bronchus or lung: Secondary | ICD-10-CM | POA: Diagnosis not present

## 2020-06-04 DIAGNOSIS — Z452 Encounter for adjustment and management of vascular access device: Secondary | ICD-10-CM | POA: Insufficient documentation

## 2020-06-04 DIAGNOSIS — Z95828 Presence of other vascular implants and grafts: Secondary | ICD-10-CM

## 2020-06-04 MED ORDER — SODIUM CHLORIDE 0.9% FLUSH
10.0000 mL | Freq: Once | INTRAVENOUS | Status: AC
  Administered 2020-06-04: 10 mL
  Filled 2020-06-04: qty 10

## 2020-06-04 MED ORDER — HEPARIN SOD (PORK) LOCK FLUSH 100 UNIT/ML IV SOLN
500.0000 [IU] | Freq: Once | INTRAVENOUS | Status: AC
  Administered 2020-06-04: 500 [IU] via INTRAVENOUS
  Filled 2020-06-04: qty 5

## 2020-06-04 MED ORDER — HEPARIN SOD (PORK) LOCK FLUSH 100 UNIT/ML IV SOLN
INTRAVENOUS | Status: AC
  Filled 2020-06-04: qty 5

## 2020-06-23 DIAGNOSIS — J439 Emphysema, unspecified: Secondary | ICD-10-CM | POA: Diagnosis not present

## 2020-07-19 DIAGNOSIS — E039 Hypothyroidism, unspecified: Secondary | ICD-10-CM | POA: Diagnosis not present

## 2020-07-19 DIAGNOSIS — E2749 Other adrenocortical insufficiency: Secondary | ICD-10-CM | POA: Diagnosis not present

## 2020-07-24 DIAGNOSIS — J439 Emphysema, unspecified: Secondary | ICD-10-CM | POA: Diagnosis not present

## 2020-07-26 DIAGNOSIS — E2749 Other adrenocortical insufficiency: Secondary | ICD-10-CM | POA: Diagnosis not present

## 2020-07-26 DIAGNOSIS — E039 Hypothyroidism, unspecified: Secondary | ICD-10-CM | POA: Diagnosis not present

## 2020-08-12 ENCOUNTER — Ambulatory Visit: Payer: PPO | Admitting: Radiation Oncology

## 2020-08-21 DIAGNOSIS — J439 Emphysema, unspecified: Secondary | ICD-10-CM | POA: Diagnosis not present

## 2020-08-24 DIAGNOSIS — H04203 Unspecified epiphora, bilateral lacrimal glands: Secondary | ICD-10-CM | POA: Diagnosis not present

## 2020-09-03 ENCOUNTER — Ambulatory Visit
Admission: RE | Admit: 2020-09-03 | Discharge: 2020-09-03 | Disposition: A | Discharge status: Home / Self Care | Payer: PPO | Source: Ambulatory Visit | Attending: Oncology | Admitting: Oncology

## 2020-09-03 ENCOUNTER — Other Ambulatory Visit: Payer: Self-pay

## 2020-09-03 DIAGNOSIS — C3492 Malignant neoplasm of unspecified part of left bronchus or lung: Secondary | ICD-10-CM | POA: Insufficient documentation

## 2020-09-03 DIAGNOSIS — J8410 Pulmonary fibrosis, unspecified: Secondary | ICD-10-CM | POA: Diagnosis not present

## 2020-09-03 DIAGNOSIS — J432 Centrilobular emphysema: Secondary | ICD-10-CM | POA: Diagnosis not present

## 2020-09-03 DIAGNOSIS — C3490 Malignant neoplasm of unspecified part of unspecified bronchus or lung: Secondary | ICD-10-CM | POA: Diagnosis not present

## 2020-09-03 DIAGNOSIS — I2510 Atherosclerotic heart disease of native coronary artery without angina pectoris: Secondary | ICD-10-CM | POA: Diagnosis not present

## 2020-09-06 ENCOUNTER — Encounter: Payer: Self-pay | Admitting: Oncology

## 2020-09-06 ENCOUNTER — Inpatient Hospital Stay: Payer: PPO | Attending: Oncology

## 2020-09-06 ENCOUNTER — Inpatient Hospital Stay (HOSPITAL_BASED_OUTPATIENT_CLINIC_OR_DEPARTMENT_OTHER): Payer: PPO | Admitting: Oncology

## 2020-09-06 VITALS — BP 137/91 | HR 88 | Temp 98.2°F | Resp 18 | Wt 239.9 lb

## 2020-09-06 DIAGNOSIS — Z87891 Personal history of nicotine dependence: Secondary | ICD-10-CM | POA: Diagnosis not present

## 2020-09-06 DIAGNOSIS — Z9079 Acquired absence of other genital organ(s): Secondary | ICD-10-CM | POA: Diagnosis not present

## 2020-09-06 DIAGNOSIS — E2740 Unspecified adrenocortical insufficiency: Secondary | ICD-10-CM | POA: Insufficient documentation

## 2020-09-06 DIAGNOSIS — Z79899 Other long term (current) drug therapy: Secondary | ICD-10-CM | POA: Diagnosis not present

## 2020-09-06 DIAGNOSIS — F418 Other specified anxiety disorders: Secondary | ICD-10-CM | POA: Diagnosis not present

## 2020-09-06 DIAGNOSIS — C3492 Malignant neoplasm of unspecified part of left bronchus or lung: Secondary | ICD-10-CM | POA: Diagnosis not present

## 2020-09-06 DIAGNOSIS — Z9221 Personal history of antineoplastic chemotherapy: Secondary | ICD-10-CM | POA: Diagnosis not present

## 2020-09-06 DIAGNOSIS — Z8546 Personal history of malignant neoplasm of prostate: Secondary | ICD-10-CM | POA: Diagnosis not present

## 2020-09-06 DIAGNOSIS — J432 Centrilobular emphysema: Secondary | ICD-10-CM | POA: Insufficient documentation

## 2020-09-06 DIAGNOSIS — M8580 Other specified disorders of bone density and structure, unspecified site: Secondary | ICD-10-CM | POA: Insufficient documentation

## 2020-09-06 DIAGNOSIS — Z7952 Long term (current) use of systemic steroids: Secondary | ICD-10-CM | POA: Insufficient documentation

## 2020-09-06 DIAGNOSIS — Z7951 Long term (current) use of inhaled steroids: Secondary | ICD-10-CM | POA: Diagnosis not present

## 2020-09-06 DIAGNOSIS — I129 Hypertensive chronic kidney disease with stage 1 through stage 4 chronic kidney disease, or unspecified chronic kidney disease: Secondary | ICD-10-CM | POA: Diagnosis not present

## 2020-09-06 DIAGNOSIS — Z85038 Personal history of other malignant neoplasm of large intestine: Secondary | ICD-10-CM | POA: Insufficient documentation

## 2020-09-06 DIAGNOSIS — Z95828 Presence of other vascular implants and grafts: Secondary | ICD-10-CM

## 2020-09-06 DIAGNOSIS — I2510 Atherosclerotic heart disease of native coronary artery without angina pectoris: Secondary | ICD-10-CM | POA: Insufficient documentation

## 2020-09-06 DIAGNOSIS — D472 Monoclonal gammopathy: Secondary | ICD-10-CM | POA: Insufficient documentation

## 2020-09-06 DIAGNOSIS — R635 Abnormal weight gain: Secondary | ICD-10-CM | POA: Diagnosis not present

## 2020-09-06 DIAGNOSIS — K219 Gastro-esophageal reflux disease without esophagitis: Secondary | ICD-10-CM | POA: Insufficient documentation

## 2020-09-06 DIAGNOSIS — C61 Malignant neoplasm of prostate: Secondary | ICD-10-CM | POA: Diagnosis not present

## 2020-09-06 DIAGNOSIS — T380X5A Adverse effect of glucocorticoids and synthetic analogues, initial encounter: Secondary | ICD-10-CM | POA: Insufficient documentation

## 2020-09-06 DIAGNOSIS — N189 Chronic kidney disease, unspecified: Secondary | ICD-10-CM | POA: Diagnosis not present

## 2020-09-06 DIAGNOSIS — M1990 Unspecified osteoarthritis, unspecified site: Secondary | ICD-10-CM | POA: Insufficient documentation

## 2020-09-06 DIAGNOSIS — E032 Hypothyroidism due to medicaments and other exogenous substances: Secondary | ICD-10-CM

## 2020-09-06 LAB — COMPREHENSIVE METABOLIC PANEL
ALT: 27 U/L (ref 0–44)
AST: 24 U/L (ref 15–41)
Albumin: 4 g/dL (ref 3.5–5.0)
Alkaline Phosphatase: 50 U/L (ref 38–126)
Anion gap: 9 (ref 5–15)
BUN: 28 mg/dL — ABNORMAL HIGH (ref 8–23)
CO2: 26 mmol/L (ref 22–32)
Calcium: 8.9 mg/dL (ref 8.9–10.3)
Chloride: 103 mmol/L (ref 98–111)
Creatinine, Ser: 1.66 mg/dL — ABNORMAL HIGH (ref 0.61–1.24)
GFR, Estimated: 43 mL/min — ABNORMAL LOW (ref >60)
Glucose, Bld: 100 mg/dL — ABNORMAL HIGH (ref 70–99)
Potassium: 3.8 mmol/L (ref 3.5–5.1)
Sodium: 138 mmol/L (ref 135–145)
Total Bilirubin: 0.7 mg/dL (ref 0.3–1.2)
Total Protein: 6.9 g/dL (ref 6.5–8.1)

## 2020-09-06 LAB — CBC WITH DIFFERENTIAL/PLATELET
Abs Immature Granulocytes: 0.03 10*3/uL (ref 0.00–0.07)
Basophils Absolute: 0 10*3/uL (ref 0.0–0.1)
Basophils Relative: 1 %
Eosinophils Absolute: 0.1 10*3/uL (ref 0.0–0.5)
Eosinophils Relative: 2 %
HCT: 42.3 % (ref 39.0–52.0)
Hemoglobin: 13.9 g/dL (ref 13.0–17.0)
Immature Granulocytes: 0 %
Lymphocytes Relative: 19 %
Lymphs Abs: 1.4 10*3/uL (ref 0.7–4.0)
MCH: 30.5 pg (ref 26.0–34.0)
MCHC: 32.9 g/dL (ref 30.0–36.0)
MCV: 92.8 fL (ref 80.0–100.0)
Monocytes Absolute: 0.9 10*3/uL (ref 0.1–1.0)
Monocytes Relative: 13 %
Neutro Abs: 4.8 10*3/uL (ref 1.7–7.7)
Neutrophils Relative %: 65 %
Platelets: 195 10*3/uL (ref 150–400)
RBC: 4.56 MIL/uL (ref 4.22–5.81)
RDW: 15.4 % (ref 11.5–15.5)
WBC: 7.2 10*3/uL (ref 4.0–10.5)
nRBC: 0 % (ref 0.0–0.2)

## 2020-09-06 MED ORDER — HEPARIN SOD (PORK) LOCK FLUSH 100 UNIT/ML IV SOLN
INTRAVENOUS | Status: AC
  Filled 2020-09-06: qty 5

## 2020-09-06 MED ORDER — HEPARIN SOD (PORK) LOCK FLUSH 100 UNIT/ML IV SOLN
500.0000 [IU] | Freq: Once | INTRAVENOUS | Status: AC
  Administered 2020-09-06: 500 [IU] via INTRAVENOUS
  Filled 2020-09-06: qty 5

## 2020-09-06 MED ORDER — SODIUM CHLORIDE 0.9% FLUSH
10.0000 mL | Freq: Once | INTRAVENOUS | Status: AC
  Administered 2020-09-06: 10 mL via INTRAVENOUS
  Filled 2020-09-06: qty 10

## 2020-09-06 NOTE — Progress Notes (Signed)
Hematology/Oncology Follow Up Note [facility] Telephone:([phone] [phone]   Patient Care Team: [name], MD as PCP - General (Family Medicine) [name], [name], MD (General Surgery) [name], MD as Consulting Physician (Oncology) [name], RN as Registered Nurse  Reason for Visit:  Follow up for lung cancer, immunotherapy side effects, adrenal insufficiency.  HISTORY OF PRESENTING ILLNESS:  Marc Gray III 75 y.o.  male who presents for treatment of Stage IIIB (cT2b, cN3, cM0)  Adenocarcinoma of lung.  # Patient is s/p [place] and taxol concurrent RT for treatment of Stage IIIB Lung cancer. He recieved additional lung boost RT finished one year of Durvalumab -January 2020  # Colon CA was listed in his history, however reviewing his surgical pathology from colonoscopy on 04/15/2014, during which he had polyps removed and all are negative for cancer.patient is scheduled for colonoscopy screening this year.  # 5/1/ 2019 CT scan was discussed at tumor board and changes consistent with radiation changes  # TURP procedure done on 02/14/2019.  Pathology showed prostate adenocarcinoma, Gleason score 3+3, tumor quantitation, estimated percentage of prostate tissue involved by tumor less than 5%. No perineural invasion identified. Patient follows up with urologist and was recommended to observe.  INTERVAL HISTORY Patient he presents for follow-up of fo stage IIIB adenocarcinoma of the lung. Patient reports no new complaints.  Clinically he is doing well from oncology aspect. No nausea vomiting diarrhea, cough, shortness of breath. Patient was seen by pulmonology and was recommended to start on Lasix 20 mg since then.  Review of Systems  Constitutional: Negative for appetite change, chills, fatigue, fever and unexpected weight change.  HENT:   Negative for hearing loss and voice change.   Eyes: Negative for eye problems and  icterus.  Respiratory: Negative for chest tightness, cough and shortness of breath.   Cardiovascular: Negative for chest pain and leg swelling.  Gastrointestinal: Negative for abdominal distention, abdominal pain and blood in stool.  Endocrine: Negative for hot flashes.  Genitourinary: Negative for difficulty urinating, dysuria and frequency.   Musculoskeletal: Negative for arthralgias and back pain.  Skin: Negative for itching and rash.  Neurological: Negative for extremity weakness, light-headedness and numbness.  Hematological: Negative for adenopathy. Does not bruise/bleed easily.  Psychiatric/Behavioral: Negative for confusion.    MEDICAL HISTORY:  Past Medical History:  Diagnosis Date  . Anxiety   . Aortic atherosclerosis ([place])   . Arthritis    SHOULDER  . BPH (benign prostatic hyperplasia)   . Cataract   . Chronic kidney disease    low kidney function  . Colon adenomas   . Colon cancer ([place]) 2015   Pt states during his colonoscopy he had cancerous polyps removed.   [name] COPD (chronic obstructive pulmonary disease) ([place])   . Depression    SINCE DIAGNOSIS  . Dyspnea    DOE  . Enlarged prostate   . GERD (gastroesophageal reflux disease)   . Hypertension   . Hypothyroidism   . Lung cancer [facility]    Aug 2018  . Pain    DUE TO LUNG ISSUE    SURGICAL HISTORY: Past Surgical History:  Procedure Laterality Date  . BACK SURGERY     kyphoplasty  T5-6  . CATARACT EXTRACTION W/ INTRAOCULAR LENS  IMPLANT, BILATERAL    . COLONOSCOPY    . COLONOSCOPY WITH PROPOFOL N/A 12/17/2019   Procedure: COLONOSCOPY WITH PROPOFOL;  Surgeon: Robert Bellow, MD;  Location: [facility] END[facility]COPY;  Service: Endoscopy;  Laterality:  N/A;  . EYE SURGERY Bilateral    cataract  . LYMPH GLAND EXCISION N/A 02/09/2017   Procedure: CERVICAL LYMPH NODE BIOPSY;  Surgeon: Robert Bellow, MD;  Location: [facility];  Service: General;  Laterality: N/A;  . PORTACATH PLACEMENT Right 02/23/2017   Procedure:  INSERTION PORT-A-CATH;  Surgeon: Robert Bellow, MD;  Location: [facility];  Service: General;  Laterality: Right;  . PROSTATE SURGERY    . TONSILLECTOMY    . TRANSURETHRAL RESECTION OF PROSTATE N/A 02/14/2019   Procedure: TRANSURETHRAL RESECTION OF THE PROSTATE (TURP);  Surgeon: [name], MD;  Location: [facility];  Service: Urology;  Laterality: N/A;    SOCIAL HISTORY: Social History   Socioeconomic History  . Marital status: Married    Spouse name: Not on file  . Number of children: Not on file  . Years of education: Not on file  . Highest education level: Not on file  Occupational History  . Not on file  Tobacco Use  . Smoking status: Former Smoker    Packs/day: 1.00    Years: 53.00    Pack years: 53.00    Types: Cigarettes    Quit date: 04/14/2017    Years since quitting: 3.4  . Smokeless tobacco: Never Used  . Tobacco comment: Quit November 2018  Vaping Use  . Vaping Use: Never used  Substance and Sexual Activity  . Alcohol use: Not Currently    Comment: occassional  . Drug use: No  . Sexual activity: Yes  Other Topics Concern  . Not on file  Social History Narrative  . Not on file   Social Determinants of Health   Financial Resource Strain: Not on file  Food Insecurity: Not on file  Transportation Needs: Not on file  Physical Activity: Not on file  Stress: Not on file  Social Connections: Not on file  Intimate Partner Violence: Not on file    FAMILY HISTORY: Family History  Problem Relation Age of Onset  . Breast cancer Maternal Grandmother   . Dementia Mother   . Diabetes Father   . Stroke Father     ALLERGIES:  is allergic to morphine and related.  MEDICATIONS:  Current Outpatient Medications  Medication Sig Dispense Refill  . acetaminophen (TYLENOL) 500 MG tablet Take 1,000 mg by mouth every 6 (six) hours as needed for moderate pain.    . Azelastine HCl 137 MCG/SPRAY SOLN SMARTSIG:1-2 Spray(s) Both Nares Twice Daily    . BREZTRI  AEROSPHERE 160-9-4.8 MCG/ACT AERO Inhale 2 puffs into the lungs 2 (two) times daily.    . budesonide (PULMICORT) 0.5 MG/2ML nebulizer solution Take by nebulization 2 (two) times daily.    . calcium-vitamin D (OSCAL WITH D) 500-200 MG-UNIT tablet Take 1 tablet by mouth daily. (Patient taking differently: Take 2 tablets by mouth daily.) 90 tablet 1  . Chlorpheniramine-Phenylephrine 4-10 MG tablet Take 1 tablet by mouth every 6 (six) hours as needed for allergies.     . diphenhydrAMINE-zinc acetate (BENADRYL EXTRA STRENGTH) cream Apply 1 application topically 3 (three) times daily as needed for itching. 28 g 0  . fluticasone (FLONASE) 50 MCG/ACT nasal spray Place 1 spray into both nostrils daily. 16 g 2  . furosemide (LASIX) 20 MG tablet Take by mouth.    . Ipratropium-Albuterol (COMBIVENT) 20-100 MCG/ACT AERS respimat Inhale into the lungs.    [name] ipratropium-albuterol (DUONEB) 0.5-2.5 (3) MG/3ML SOLN Inhale into the lungs.    [name] levothyroxine (SYNTHROID) 150 MCG tablet Take 150 mcg  by mouth daily before breakfast.    . lidocaine-prilocaine (EMLA) cream Apply over port 1-2 hours prior to chemotherapy treatment.  Cover with plastic wrap. 30 g 1  . Melatonin 10 MG TABS Take 10 mg by mouth at bedtime.     . Multiple Vitamin (MULTI-VITAMINS) TABS Take 1 tablet by mouth daily.     . pantoprazole (PROTONIX) 40 MG tablet Take 1 tablet (40 mg total) by mouth daily. 90 tablet 0  . phentermine (ADIPEX-P) 37.5 MG tablet Take 37.5 mg by mouth at bedtime.    . predniSONE (DELTASONE) 10 MG tablet Take one tablet each morning. Take one-half tablet each afternoon (take between 2 - 5 PM) (Patient taking differently: Take 5-10 mg by mouth See admin instructions. Take 10 mg by mouth in the morning and 5 mg in the late afternoon (between 2 - 5 PM)) 45 tablet 0  . prochlorperazine (COMPAZINE) 10 MG tablet Take 1 tablet (10 mg total) by mouth every 6 (six) hours as needed for nausea or vomiting. 60 tablet 2  . sertraline  (ZOLOFT) 25 MG tablet TAKE 1 TABLET(25 MG) BY MOUTH DAILY 90 tablet 0  . tadalafil (CIALIS) 5 MG tablet Take 1 tablet (5 mg total) by mouth daily as needed for erectile dysfunction. 30 tablet 11  . tamsulosin (FLOMAX) 0.4 MG CAPS capsule Take 1 capsule (0.4 mg total) by mouth daily. 90 capsule 1  . triamcinolone ointment (KENALOG) 0.5 % Apply 1 application topically 3 (three) times daily as needed. 30 g 1  . aspirin 325 MG tablet Take 650 mg by mouth daily.  (Patient not taking: No sig reported)    . budesonide-formoterol (SYMBICORT) 160-4.5 MCG/ACT inhaler Inhale 2 puffs into the lungs 2 (two) times daily. Must contact PCP for future refills (Patient not taking: No sig reported) 1 Inhaler 0  . ondansetron (ZOFRAN) 8 MG tablet Take 1 tablet (8 mg total) by mouth every 12 (twelve) hours as needed for nausea or vomiting. (Patient not taking: No sig reported) 60 tablet 0  . polyethylene glycol powder (GLYCOLAX/MIRALAX) 17 GM/SCOOP powder TAKE AS DIRECTED FOR COLONIC PREP (Patient not taking: No sig reported)    . sulfamethoxazole-trimethoprim (BACTRIM) 400-80 MG tablet SMARTSIG:1 Tablet(s) By Mouth Every 12 Hours (Patient not taking: No sig reported)    . traMADol (ULTRAM) 50 MG tablet Take 1 tablet (50 mg total) by mouth every 6 (six) hours as needed for moderate pain or severe pain. (Patient not taking: No sig reported) 30 tablet 0   No current facility-administered medications for this visit.   Facility-Administered Medications Ordered in Other Visits  Medication Dose Route Frequency Provider Last Rate Last Admin  . sodium chloride flush (NS) 0.9 % injection 10 mL  10 mL Intravenous PRN [name], MD   10 mL at 04/03/17 7902      .  PHYSICAL EXAMINATION: ECOG PERFORMANCE STATUS: 1 - Symptomatic but completely ambulatory Vitals:   09/06/20 1034  BP: (!) 137/91  Pulse: 88  Resp: 18  Temp: 98.2 F (36.8 C)  SpO2: 97%   Filed Weights   09/06/20 1034  Weight: 239 lb 14.4 oz (108.8 kg)   Physical Exam Constitutional:      General: He is not in acute distress.    Appearance: He is not diaphoretic.  HENT:     Head: Normocephalic and atraumatic.     Nose: Nose normal.     Mouth/Throat:     Pharynx: No oropharyngeal exudate.  Eyes:  General: No scleral icterus.    Pupils: Pupils are equal, round, and reactive to light.  Cardiovascular:     Rate and Rhythm: Normal rate and regular rhythm.     Heart sounds: No murmur heard.   Pulmonary:     Effort: Pulmonary effort is normal. No respiratory distress.     Breath sounds: No rales.  Chest:     Chest wall: No tenderness.  Abdominal:     General: There is no distension.     Palpations: Abdomen is soft.     Tenderness: There is no abdominal tenderness.  Musculoskeletal:        General: Normal range of motion.     Cervical back: Normal range of motion and neck supple.  Skin:    General: Skin is warm and dry.     Findings: No erythema.  Neurological:     Mental Status: He is alert and oriented to person, place, and time.     Cranial Nerves: No cranial nerve deficit.     Motor: No abnormal muscle tone.     Coordination: Coordination normal.  Psychiatric:        Mood and Affect: Affect normal.    LABORATORY DATA:  I have reviewed the data as listed Lab Results  Component Value Date   WBC 7.2 09/06/2020   HGB 13.9 09/06/2020   HCT 42.3 09/06/2020   MCV 92.8 09/06/2020   PLT 195 09/06/2020   Recent Labs    11/07/19 1250 03/04/20 1034 09/06/20 1009  NA 140 140 138  K 4.1 3.8 3.8  CL 106 105 103  CO2 _0 GLUCOSE 115* 96 100*  BUN 24* 21 28*  CREATININE 1.36* 1.19 1.66*  CALCIUM 8.8* 8.4* 8.9  GFRNONAA 52* >60 43*  GFRAA 60*  --   --   PROT 6.8 6.4* 6.9  ALBUMIN 3.7 3.3* 4.0  AST _1 ALT _2 ALKPHOS 52 41 50  BILITOT 0.7 0.6 0.7  RADIOGRAPHIC STUDIES: I have personally reviewed the radiological images as listed and agreed with the findings in the report. CT CHEST WO  CONTRAST  Result Date: 09/04/2020 CLINICAL DATA:  Lung cancer restaging, status post chemotherapy and radiation EXAM: CT CHEST WITHOUT CONTRAST TECHNIQUE: Multidetector CT imaging of the chest was performed following the standard protocol without IV contrast. COMPARISON:  02/23/2020 FINDINGS: Cardiovascular: Right chest port catheter. Aortic atherosclerosis. Normal heart size. Three-vessel coronary artery calcifications. No pericardial effusion. Mediastinum/Nodes: No enlarged mediastinal, hilar, or axillary lymph nodes. Thyroid gland, trachea, and esophagus demonstrate no significant findings. Lungs/Pleura: Moderate centrilobular emphysema. Unchanged post treatment appearance of the left chest with dense, bandlike perihilar and paramedian left upper lobe consolidation and fibrosis. Stable 4 mm nodule of the right lower lobe, which appears to be partially calcified and almost certainly benign (series 3, image 84). No pleural effusion or pneumothorax. Upper Abdomen: No acute abnormality. Very atrophic left kidney, partially imaged in the included upper abdomen. Musculoskeletal: No chest wall mass or suspicious bone lesions identified. IMPRESSION: 1. Unchanged post treatment appearance of the left chest with dense, bandlike perihilar and paramedian left upper lobe consolidation and fibrosis. No evidence of recurrent or metastatic disease in the chest. 2. Stable 4 mm nodule of the right lower lobe, which appears to partially calcified and almost certainly benign. 3. Emphysema. 4. Coronary artery disease. Aortic Atherosclerosis (ICD10-I70.0) and Emphysema (ICD10-J43.9). Electronically Signed   By: Eddie Candle M.D.   On: 09/04/2020 20:17  ASSESSMENT & PLAN:  Cancer Staging Adenocarcinoma of left lung, stage 3 (HCC) Staging form: Lung, AJCC 8th Edition - Clinical stage from 02/14/2017: Stage IIIB (cT2b, cN3, cM0) - Signed by [name], MD on 02/14/2017  1. Port-A-Cath in place   2. Adenocarcinoma of left lung,  stage 3 ([place])   3. MGUS (monoclonal gammopathy of unknown significance)   4. Adrenal insufficiency ([place])   5. Hypothyroidism due to medication   6. Prostate cancer ([place])    #Stage IIIB lung adenocarcinoma, s/p concurrent chemo/RT and immunotherapy, finished all treatment in January 2020 CT images reviewed and discussed with him.  Stable, no disease recurrence.  Continue CT chest without contrast surveillance in 6 months.    #Hypothyroidism and adrenal insufficiency.  Chronic steroid use, osteopenia. Recommend calcium and Vitamin D Continue Synthroid.  Continue follow-up with endocrinologist.  #Weight gain, secondary to chronic steroid use.  Lifestyle modification discussed with patient.  I will defer to endocrinology for decision on weight loss intervention/medication.  Encourage patient to further discuss with Dr. Elisabeth Cara.  # MGUS,  multiple myeloma panel is stable.   multiple myeloma panel was checked today.  Results were pending. #CKD, avoid NSAIDs. #Prostate adenocarcinoma, Gleason score 6, currently on observation.  Last PSA 09/01/2019 1.5.  Recommend patient to follow-up with urology. #Port-A-Cath in place, we discussed about option of port discontinuation.  Patient prefers to keep the port.  Continue port flush every 8 weeks.  Repeat CT scan in 6 months and follow-up in the clinic. Orders Placed This Encounter  Procedures  . CT Chest Wo Contrast    Standing Status:   Future    Standing Expiration Date:   09/06/2021    Order Specific Question:   Preferred imaging location?    Answer:   [facility]   Follow up in 6 months, repeat CT chest surveillance scan.   [name], MD, PhD  09/06/2020

## 2020-09-06 NOTE — Progress Notes (Signed)
Patient denies new problems/concerns today.   °

## 2020-09-07 ENCOUNTER — Other Ambulatory Visit: Payer: Self-pay

## 2020-09-07 ENCOUNTER — Encounter: Payer: Self-pay | Admitting: Radiation Oncology

## 2020-09-07 ENCOUNTER — Ambulatory Visit
Admission: RE | Admit: 2020-09-07 | Discharge: 2020-09-07 | Disposition: A | Discharge status: Home / Self Care | Payer: PPO | Source: Ambulatory Visit | Attending: Radiation Oncology | Admitting: Radiation Oncology

## 2020-09-07 DIAGNOSIS — C3492 Malignant neoplasm of unspecified part of left bronchus or lung: Secondary | ICD-10-CM

## 2020-09-07 DIAGNOSIS — C3412 Malignant neoplasm of upper lobe, left bronchus or lung: Secondary | ICD-10-CM | POA: Diagnosis not present

## 2020-09-07 DIAGNOSIS — Z9221 Personal history of antineoplastic chemotherapy: Secondary | ICD-10-CM | POA: Insufficient documentation

## 2020-09-07 DIAGNOSIS — Z923 Personal history of irradiation: Secondary | ICD-10-CM | POA: Insufficient documentation

## 2020-09-07 DIAGNOSIS — E2740 Unspecified adrenocortical insufficiency: Secondary | ICD-10-CM | POA: Diagnosis not present

## 2020-09-07 DIAGNOSIS — R911 Solitary pulmonary nodule: Secondary | ICD-10-CM | POA: Diagnosis not present

## 2020-09-07 DIAGNOSIS — F17210 Nicotine dependence, cigarettes, uncomplicated: Secondary | ICD-10-CM | POA: Diagnosis not present

## 2020-09-07 DIAGNOSIS — E039 Hypothyroidism, unspecified: Secondary | ICD-10-CM | POA: Insufficient documentation

## 2020-09-07 DIAGNOSIS — Z08 Encounter for follow-up examination after completed treatment for malignant neoplasm: Secondary | ICD-10-CM | POA: Diagnosis not present

## 2020-09-07 LAB — KAPPA/LAMBDA LIGHT CHAINS
Kappa free light chain: 45.2 mg/L — ABNORMAL HIGH (ref 3.3–19.4)
Kappa, lambda light chain ratio: 1.84 — ABNORMAL HIGH (ref 0.26–1.65)
Lambda free light chains: 24.5 mg/L (ref 5.7–26.3)

## 2020-09-07 NOTE — Progress Notes (Signed)
Radiation Oncology Follow up Note  Name: Marc Gray   Date:   09/07/2020 MRN:  [id number] DOB: 1946/03/04    This 75 y.o. male presents to the clinic today for 3-year follow-up status post concurrent chemoradiation therapy for stage IIIb adenocarcinoma the lung.  REFERRING PROVIDER: [name], MD  HPI: Patient is a 75 year old male now out 3 years having completed concurrent chemoradiation therapy for stage IIIb adenocarcinoma lung seen today in routine follow-up he is doing fairly well.[name]  His initial tumor was a T2b N3 M0.  Patient does have prostate cancer which is being followed.  He specifically denies dysphagia hemoptysis chest tightness.  Patient does have hypothyroidism and adrenal insufficiency is on chronic steroid use.  Patient had a recent CT scan showing unchanged posttreatment appearance the left chest with dense bandlike perihilar and paramedial consolidation and fibrosis.  He has a stable 4 mm nodule of the right lower lobe which appears to be almost certainly benign.  COMPLICATIONS OF TREATMENT: none  FOLLOW UP COMPLIANCE: keeps appointments   PHYSICAL EXAM:  BP (!) (P) 152/94 (BP Location: Left Arm, Patient Position: Sitting)   Pulse (P) 90   Temp (!) (P) 97.4 F (36.3 C) (Tympanic)   Resp (P) 18   Wt (P) 241 lb (109.3 kg)   BMI (P) 31.80 kg/m  Well-developed well-nourished patient in NAD. HEENT reveals PERLA, EOMI, discs not visualized.  Oral cavity is clear. No oral mucosal lesions are identified. Neck is clear without evidence of cervical or supraclavicular adenopathy. Lungs are clear to A&P. Cardiac examination is essentially unremarkable with regular rate and rhythm without murmur rub or thrill. Abdomen is benign with no organomegaly or masses noted. Motor sensory and DTR levels are equal and symmetric in the upper and lower extremities. Cranial nerves II through XII are grossly intact. Proprioception is intact. No peripheral adenopathy or edema is  identified. No motor or sensory levels are noted. Crude visual fields are within normal range.  RADIOLOGY RESULTS: Serial CT scans reviewed compatible with above-stated findings  PLAN: Present time patient is now 3 years out from concurrent chemoradiation for stage IIIb adenocarcinoma of the lung.  He is under excellent control by CT criteria.  And pleased with his overall progress I have asked to see him back in 1 year for follow-up and then will discontinue follow-up care.  He continues close follow-up care management by medical oncology.  Patient is to call with any concerns.  I would like to take this opportunity to thank you for allowing me to participate in the care of your patient.Noreene Filbert, MD

## 2020-09-08 LAB — MULTIPLE MYELOMA PANEL, SERUM
Albumin SerPl Elph-Mcnc: 3.7 g/dL (ref 2.9–4.4)
Albumin/Glob SerPl: 1.3 (ref 0.7–1.7)
Alpha 1: 0.3 g/dL (ref 0.0–0.4)
Alpha2 Glob SerPl Elph-Mcnc: 0.7 g/dL (ref 0.4–1.0)
B-Globulin SerPl Elph-Mcnc: 1.2 g/dL (ref 0.7–1.3)
Gamma Glob SerPl Elph-Mcnc: 0.8 g/dL (ref 0.4–1.8)
Globulin, Total: 3 g/dL (ref 2.2–3.9)
IgA: 254 mg/dL (ref 61–437)
IgG (Immunoglobin G), Serum: 754 mg/dL (ref 603–1613)
IgM (Immunoglobulin M), Srm: 74 mg/dL (ref 15–143)
M Protein SerPl Elph-Mcnc: 0.2 g/dL — ABNORMAL HIGH
Total Protein ELP: 6.7 g/dL (ref 6.0–8.5)

## 2020-09-14 ENCOUNTER — Other Ambulatory Visit: Payer: Self-pay | Admitting: *Deleted

## 2020-09-14 DIAGNOSIS — R972 Elevated prostate specific antigen [PSA]: Secondary | ICD-10-CM

## 2020-09-14 DIAGNOSIS — N401 Enlarged prostate with lower urinary tract symptoms: Secondary | ICD-10-CM

## 2020-09-16 DIAGNOSIS — J449 Chronic obstructive pulmonary disease, unspecified: Secondary | ICD-10-CM | POA: Diagnosis not present

## 2020-09-20 DIAGNOSIS — Z01818 Encounter for other preprocedural examination: Secondary | ICD-10-CM | POA: Diagnosis not present

## 2020-09-20 DIAGNOSIS — R0602 Shortness of breath: Secondary | ICD-10-CM | POA: Diagnosis not present

## 2020-09-21 DIAGNOSIS — E669 Obesity, unspecified: Secondary | ICD-10-CM | POA: Diagnosis not present

## 2020-09-21 DIAGNOSIS — N1832 Chronic kidney disease, stage 3b: Secondary | ICD-10-CM | POA: Diagnosis not present

## 2020-09-21 DIAGNOSIS — R0602 Shortness of breath: Secondary | ICD-10-CM | POA: Diagnosis not present

## 2020-09-21 DIAGNOSIS — J439 Emphysema, unspecified: Secondary | ICD-10-CM | POA: Diagnosis not present

## 2020-09-21 DIAGNOSIS — E860 Dehydration: Secondary | ICD-10-CM | POA: Diagnosis not present

## 2020-09-21 DIAGNOSIS — E785 Hyperlipidemia, unspecified: Secondary | ICD-10-CM | POA: Diagnosis not present

## 2020-09-21 DIAGNOSIS — G4700 Insomnia, unspecified: Secondary | ICD-10-CM | POA: Diagnosis not present

## 2020-09-30 ENCOUNTER — Other Ambulatory Visit: Payer: Self-pay

## 2020-09-30 ENCOUNTER — Other Ambulatory Visit: Payer: PPO

## 2020-09-30 DIAGNOSIS — C61 Malignant neoplasm of prostate: Secondary | ICD-10-CM

## 2020-09-30 DIAGNOSIS — R972 Elevated prostate specific antigen [PSA]: Secondary | ICD-10-CM

## 2020-10-04 ENCOUNTER — Other Ambulatory Visit: Payer: Self-pay

## 2020-10-07 ENCOUNTER — Ambulatory Visit: Payer: Self-pay | Admitting: Urology

## 2020-10-13 ENCOUNTER — Other Ambulatory Visit: Payer: Self-pay

## 2020-10-13 ENCOUNTER — Encounter: Payer: Self-pay | Admitting: Urology

## 2020-10-13 ENCOUNTER — Ambulatory Visit: Payer: PPO | Admitting: Urology

## 2020-10-13 VITALS — BP 127/68 | HR 80 | Ht 70.0 in | Wt 239.0 lb

## 2020-10-13 DIAGNOSIS — C61 Malignant neoplasm of prostate: Secondary | ICD-10-CM

## 2020-10-13 DIAGNOSIS — R3914 Feeling of incomplete bladder emptying: Secondary | ICD-10-CM | POA: Diagnosis not present

## 2020-10-13 DIAGNOSIS — N401 Enlarged prostate with lower urinary tract symptoms: Secondary | ICD-10-CM

## 2020-10-13 DIAGNOSIS — N528 Other male erectile dysfunction: Secondary | ICD-10-CM

## 2020-10-13 MED ORDER — TADALAFIL 5 MG PO TABS: ORAL_TABLET | ORAL | 11 refills | Status: DC

## 2020-10-13 NOTE — Progress Notes (Signed)
   10/13/2020 9:02 AM   [name] 1946-04-09 [id number]  Reason for visit: Follow up BPH, low risk prostate cancer diagnosed on TURP  HPI: I saw Mr. Soderquist back in urology clinic for follow-up of BPH and a low risk prostate cancer.  He is a 75 year old male with lung cancer with history of urinary retention and bilateral hydroureteronephrosis in 2013 treated with a TURP by Dr. Jacqlyn Larsen who I saw in the fall 2020 for urinary symptoms and was noted to have significantly elevated PVRs greater than 700 mL.  Cystoscopy showed some residual obstructive anterior prostate tissue and a massively distended bladder, and urodynamics showed high pressure voiding with bladder outlet obstruction.  He has had a known atrophic left kidney on recent renal ultrasound. He underwent a TURP with me on 02/14/2019, and tissue did show <5% involvement of low risk prostate cancer with 3+3=6 disease.  PSA postop has is stable at 1.5 from 1.4, PSA this year is still pending.  Since surgery, has been doing very well.  He is emptying his bladder with low PVRs, and PVR is only mildly elevated at 200 mL today.  He denies any urinary complaints or voiding problems, no incontinence.   Plan to continue active surveillance for his low risk prostate cancer incidentally found at time of TURP with yearly PSA.  We discussed the very low risk of progression or problems from this in the future.  We discussed return precautions at length including gross hematuria, UTIs, worsening renal function, or incontinence.  PSA and BMP with regular lab draw with oncology in the next few weeks, call with results Continue yearly follow-up for PVR and PSA  [name], [place] [address], [place], [place] [address] [phone]

## 2020-10-21 DIAGNOSIS — J439 Emphysema, unspecified: Secondary | ICD-10-CM | POA: Diagnosis not present

## 2020-11-01 ENCOUNTER — Inpatient Hospital Stay: Payer: PPO | Attending: Oncology

## 2020-11-01 DIAGNOSIS — N401 Enlarged prostate with lower urinary tract symptoms: Secondary | ICD-10-CM

## 2020-11-01 DIAGNOSIS — Z95828 Presence of other vascular implants and grafts: Secondary | ICD-10-CM

## 2020-11-01 DIAGNOSIS — C61 Malignant neoplasm of prostate: Secondary | ICD-10-CM

## 2020-11-01 LAB — PSA: Prostatic Specific Antigen: 0.72 ng/mL (ref 0.00–4.00)

## 2020-11-01 MED ORDER — HEPARIN SOD (PORK) LOCK FLUSH 100 UNIT/ML IV SOLN
500.0000 [IU] | Freq: Once | INTRAVENOUS | Status: AC
  Administered 2020-11-01: 500 [IU] via INTRAVENOUS
  Filled 2020-11-01: qty 5

## 2020-11-01 MED ORDER — SODIUM CHLORIDE 0.9% FLUSH
10.0000 mL | Freq: Once | INTRAVENOUS | Status: AC
  Administered 2020-11-01: 10 mL via INTRAVENOUS
  Filled 2020-11-01: qty 10

## 2020-11-01 MED ORDER — HEPARIN SOD (PORK) LOCK FLUSH 100 UNIT/ML IV SOLN
INTRAVENOUS | Status: AC
  Filled 2020-11-01: qty 5

## 2020-11-01 NOTE — Patient Instructions (Signed)
[facility]  Discharge Instructions: Thank you for choosing [facility] to provide your oncology and hematology care.  If you have a lab appointment with the [facility], please go directly to the [facility] and check in at the registration area.  Wear comfortable clothing and clothing appropriate for easy access to any Portacath or PICC line.   We strive to give you quality time with your provider. You may need to reschedule your appointment if you arrive late (15 or more minutes).  Arriving late affects you and other patients whose appointments are after yours.  Also, if you miss three or more appointments without notifying the office, you may be dismissed from the clinic at the provider's discretion.      For prescription refill requests, have your pharmacy contact our office and allow 72 hours for refills to be completed.    Today you received the following chemotherapy and/or immunotherapy agents PAC FLUSH   To help prevent nausea and vomiting after your treatment, we encourage you to take your nausea medication as directed.  BELOW ARE SYMPTOMS THAT SHOULD BE REPORTED IMMEDIATELY: . *FEVER GREATER THAN 100.4 F (38 C) OR HIGHER . *CHILLS OR SWEATING . *NAUSEA AND VOMITING THAT IS NOT CONTROLLED WITH YOUR NAUSEA MEDICATION . *UNUSUAL SHORTNESS OF BREATH . *UNUSUAL BRUISING OR BLEEDING . *URINARY PROBLEMS (pain or burning when urinating, or frequent urination) . *BOWEL PROBLEMS (unusual diarrhea, constipation, pain near the anus) . TENDERNESS IN MOUTH AND THROAT WITH OR WITHOUT PRESENCE OF ULCERS (sore throat, sores in mouth, or a toothache) . UNUSUAL RASH, SWELLING OR PAIN  . UNUSUAL VAGINAL DISCHARGE OR ITCHING   Items with * indicate a potential emergency and should be followed up as soon as possible or go to the Emergency Department if any problems should occur.  Please show the CHEMOTHERAPY ALERT CARD or IMMUNOTHERAPY ALERT  CARD at check-in to the Emergency Department and triage nurse.  Should you have questions after your visit or need to cancel or reschedule your appointment, please contact [facility]  [phone] and follow the prompts.  Office hours are 8:00 a.m. to 4:30 p.m. Monday - Friday. Please note that voicemails left after 4:00 p.m. may not be returned until the following business day.  We are closed weekends and major holidays. You have access to a nurse at all times for urgent questions. Please call the main number to the clinic [phone] and follow the prompts.  For any non-urgent questions, you may also contact your provider using MyChart. We now offer e-Visits for anyone 40 and older to request care online for non-urgent symptoms. For details visit [email or web address].   Also download the MyChart app! Go to the app store, search "MyChart", open the app, select [facility], and log in with your MyChart username and password.  Due to Covid, a mask is required upon entering the hospital/clinic. If you do not have a mask, one will be given to you upon arrival. For doctor visits, patients may have 1 support person aged 78 or older with them. For treatment visits, patients cannot have anyone with them due to current Covid guidelines and our immunocompromised population.   Implanted Port Insertion, Care After This sheet gives you information about how to care for yourself after your procedure. Your health care provider may also give you more specific instructions. If you have problems or questions, contact your health care provider. What can I expect after the  procedure? After the procedure, it is common to have:  Discomfort at the port insertion site.  Bruising on the skin over the port. This should improve over 3-4 days. Follow these instructions at home: [facility] care  After your port is placed, you will get a manufacturer's information card. The card has  information about your port. Keep this card with you at all times.  Take care of the port as told by your health care provider. Ask your health care provider if you or a family member can get training for taking care of the port at home. A home health care nurse may also take care of the port.  Make sure to remember what type of port you have. Incision care  Follow instructions from your health care provider about how to take care of your port insertion site. Make sure you: ? Wash your hands with soap and water before and after you change your bandage (dressing). If soap and water are not available, use hand sanitizer. ? Change your dressing as told by your health care provider. ? Leave stitches (sutures), skin glue, or adhesive strips in place. These skin closures may need to stay in place for 2 weeks or longer. If adhesive strip edges start to loosen and curl up, you may trim the loose edges. Do not remove adhesive strips completely unless your health care provider tells you to do that.  Check your port insertion site every day for signs of infection. Check for: ? Redness, swelling, or pain. ? Fluid or blood. ? Warmth. ? Pus or a bad smell.      Activity  Return to your normal activities as told by your health care provider. Ask your health care provider what activities are safe for you.  Do not lift anything that is heavier than 10 lb (4.5 kg), or the limit that you are told, until your health care provider says that it is safe. General instructions  Take over-the-counter and prescription medicines only as told by your health care provider.  Do not take baths, swim, or use a hot tub until your health care provider approves. Ask your health care provider if you may take showers. You may only be allowed to take sponge baths.  Do not drive for 24 hours if you were given a sedative during your procedure.  Wear a medical alert bracelet in case of an emergency. This will tell any health  care providers that you have a port.  Keep all follow-up visits as told by your health care provider. This is important. Contact a health care provider if:  You cannot flush your port with saline as directed, or you cannot draw blood from the port.  You have a fever or chills.  You have redness, swelling, or pain around your port insertion site.  You have fluid or blood coming from your port insertion site.  Your port insertion site feels warm to the touch.  You have pus or a bad smell coming from the port insertion site. Get help right away if:  You have chest pain or shortness of breath.  You have bleeding from your port that you cannot control. Summary  Take care of the port as told by your health care provider. Keep the manufacturer's information card with you at all times.  Change your dressing as told by your health care provider.  Contact a health care provider if you have a fever or chills or if you have redness, swelling, or pain around  your port insertion site.  Keep all follow-up visits as told by your health care provider. This information is not intended to replace advice given to you by your health care provider. Make sure you discuss any questions you have with your health care provider. Document Revised: 12/11/2017 Document Reviewed: 12/11/2017 Elsevier Patient Education  [place].

## 2020-11-02 ENCOUNTER — Telehealth: Payer: Self-pay

## 2020-11-02 NOTE — Telephone Encounter (Signed)
Called pt informed him of the information below. Pt gave verbal understanding.  

## 2020-11-02 NOTE — Telephone Encounter (Signed)
-----   Message from [name], MD sent at 11/02/2020  8:24 AM EDT ----- Good news, PSA very low at 0.7, keep follow-up with me as scheduled in May 2023  Nickolas Madrid, MD 11/02/2020

## 2020-11-08 ENCOUNTER — Other Ambulatory Visit: Payer: Self-pay

## 2020-11-08 ENCOUNTER — Emergency Department: Payer: PPO

## 2020-11-08 ENCOUNTER — Emergency Department
Admission: EM | Admit: 2020-11-08 | Discharge: 2020-11-08 | Disposition: A | Discharge status: Home / Self Care | Payer: PPO | Attending: Emergency Medicine | Admitting: Emergency Medicine

## 2020-11-08 DIAGNOSIS — N189 Chronic kidney disease, unspecified: Secondary | ICD-10-CM | POA: Diagnosis not present

## 2020-11-08 DIAGNOSIS — Z85038 Personal history of other malignant neoplasm of large intestine: Secondary | ICD-10-CM | POA: Insufficient documentation

## 2020-11-08 DIAGNOSIS — G319 Degenerative disease of nervous system, unspecified: Secondary | ICD-10-CM | POA: Diagnosis not present

## 2020-11-08 DIAGNOSIS — Z87891 Personal history of nicotine dependence: Secondary | ICD-10-CM | POA: Insufficient documentation

## 2020-11-08 DIAGNOSIS — Z85118 Personal history of other malignant neoplasm of bronchus and lung: Secondary | ICD-10-CM | POA: Diagnosis not present

## 2020-11-08 DIAGNOSIS — K5900 Constipation, unspecified: Secondary | ICD-10-CM | POA: Insufficient documentation

## 2020-11-08 DIAGNOSIS — Z79899 Other long term (current) drug therapy: Secondary | ICD-10-CM | POA: Diagnosis not present

## 2020-11-08 DIAGNOSIS — I129 Hypertensive chronic kidney disease with stage 1 through stage 4 chronic kidney disease, or unspecified chronic kidney disease: Secondary | ICD-10-CM | POA: Diagnosis not present

## 2020-11-08 DIAGNOSIS — Z7982 Long term (current) use of aspirin: Secondary | ICD-10-CM | POA: Insufficient documentation

## 2020-11-08 DIAGNOSIS — R270 Ataxia, unspecified: Secondary | ICD-10-CM | POA: Insufficient documentation

## 2020-11-08 DIAGNOSIS — R109 Unspecified abdominal pain: Secondary | ICD-10-CM

## 2020-11-08 DIAGNOSIS — J449 Chronic obstructive pulmonary disease, unspecified: Secondary | ICD-10-CM | POA: Diagnosis not present

## 2020-11-08 DIAGNOSIS — R1032 Left lower quadrant pain: Secondary | ICD-10-CM | POA: Insufficient documentation

## 2020-11-08 DIAGNOSIS — I6782 Cerebral ischemia: Secondary | ICD-10-CM | POA: Diagnosis not present

## 2020-11-08 DIAGNOSIS — Z7951 Long term (current) use of inhaled steroids: Secondary | ICD-10-CM | POA: Insufficient documentation

## 2020-11-08 DIAGNOSIS — R29818 Other symptoms and signs involving the nervous system: Secondary | ICD-10-CM | POA: Diagnosis not present

## 2020-11-08 DIAGNOSIS — E039 Hypothyroidism, unspecified: Secondary | ICD-10-CM | POA: Diagnosis not present

## 2020-11-08 DIAGNOSIS — I10 Essential (primary) hypertension: Secondary | ICD-10-CM | POA: Diagnosis not present

## 2020-11-08 LAB — COMPREHENSIVE METABOLIC PANEL
ALT: 22 U/L (ref 0–44)
AST: 23 U/L (ref 15–41)
Albumin: 3.9 g/dL (ref 3.5–5.0)
Alkaline Phosphatase: 44 U/L (ref 38–126)
Anion gap: 7 (ref 5–15)
BUN: 28 mg/dL — ABNORMAL HIGH (ref 8–23)
CO2: 28 mmol/L (ref 22–32)
Calcium: 9.2 mg/dL (ref 8.9–10.3)
Chloride: 102 mmol/L (ref 98–111)
Creatinine, Ser: 1.6 mg/dL — ABNORMAL HIGH (ref 0.61–1.24)
GFR, Estimated: 45 mL/min — ABNORMAL LOW (ref >60)
Glucose, Bld: 89 mg/dL (ref 70–99)
Potassium: 3.5 mmol/L (ref 3.5–5.1)
Sodium: 137 mmol/L (ref 135–145)
Total Bilirubin: 0.9 mg/dL (ref 0.3–1.2)
Total Protein: 6.7 g/dL (ref 6.5–8.1)

## 2020-11-08 LAB — CBC
HCT: 44.3 % (ref 39.0–52.0)
Hemoglobin: 14.5 g/dL (ref 13.0–17.0)
MCH: 30 pg (ref 26.0–34.0)
MCHC: 32.7 g/dL (ref 30.0–36.0)
MCV: 91.7 fL (ref 80.0–100.0)
Platelets: 192 10*3/uL (ref 150–400)
RBC: 4.83 MIL/uL (ref 4.22–5.81)
RDW: 15.7 % — ABNORMAL HIGH (ref 11.5–15.5)
WBC: 11.7 10*3/uL — ABNORMAL HIGH (ref 4.0–10.5)
nRBC: 0 % (ref 0.0–0.2)

## 2020-11-08 LAB — LIPASE, BLOOD: Lipase: 31 U/L (ref 11–51)

## 2020-11-08 LAB — TROPONIN I (HIGH SENSITIVITY): Troponin I (High Sensitivity): 18 ng/L — ABNORMAL HIGH (ref <18)

## 2020-11-08 NOTE — ED Notes (Signed)
Pt to ED stating was having some lower abdominal pain this morning "severe gas pains" and then pt had BM and pain resolved. Pt states he is not constipated anymore. Pt was also "not walking steady" this morning before had BM. Steady gait was noted by triage nurse while on way to ED room.

## 2020-11-08 NOTE — ED Triage Notes (Addendum)
Pt via POV from home. Pt was sent over from [facility] Pt has been having lower abd pain this AM. Denies NV. Pt states he thinks he is constipated but did have a BM this AM but hasn't pooped for 5 days prior. Pt also c/o dizziness. Pt is A&Ox4 and NAD.

## 2020-11-08 NOTE — ED Provider Notes (Signed)
[facility] Emergency Department Provider Note  Time seen: 3:45 PM  I have reviewed the triage vital signs and the nursing notes.   HISTORY  Chief Complaint Abdominal Pain   HPI Marc Gray is a 75 y.o. male with a past medical history of anxiety, depression, gastric reflux, hypertension, presents to the emergency department for abdominal discomfort and ataxia.  According to the patient he had not had a bowel movement approximately 5 days.  He states he developed some left lower quadrant abdominal discomfort this morning and felt somewhat weak, states he had a large bowel movement the pain went away as well as the weakness.  Patient states he feels normal currently denies any abdominal discomfort.  Patient states he was feeling off balance today which the wife had to help him back to bed.  However states this has been a chronic issue over the past 1 year.  Denies any weakness numbness confusion or slurred speech.  No headache.   Past Medical History:  Diagnosis Date   Anxiety    Aortic atherosclerosis (HCC)    Arthritis    SHOULDER   BPH (benign prostatic hyperplasia)    Cataract    Chronic kidney disease    low kidney function   Colon adenomas    Colon cancer ([place]) 2015   Pt states during his colonoscopy he had cancerous polyps removed.    COPD (chronic obstructive pulmonary disease) (HCC)    Depression    SINCE DIAGNOSIS   Dyspnea    DOE   Enlarged prostate    GERD (gastroesophageal reflux disease)    Hypertension    Hypothyroidism    Lung cancer ([place])    Aug 2018   Pain    DUE TO LUNG ISSUE    Patient Active Problem List   Diagnosis Date Noted   Aortic atherosclerosis ([place]) 08/05/2019   Anxiety associated with cancer diagnosis ([place]) 02/06/2019   Adrenal insufficiency ([place]) 11/28/2017   Chemotherapy induced nausea and vomiting 11/19/2017   Constipation, acute 11/19/2017   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    Chronic bacterial conjunctivitis of both eyes 09/14/2017   Chest pain, unspecified 07/03/2017   SOB (shortness of breath) 07/03/2017   Dehydration 05/18/2017   Adenocarcinoma of left lung, stage 3 ([place]) 02/14/2017   Benign neoplasm of colon, unspecified 02/24/2014    Past Surgical History:  Procedure Laterality Date   BACK SURGERY     kyphoplasty  T5-6   CATARACT EXTRACTION W/ INTRAOCULAR LENS  IMPLANT, BILATERAL     COLONOSCOPY     COLONOSCOPY WITH PROPOFOL N/A 12/17/2019   Procedure: COLONOSCOPY WITH PROPOFOL;  Surgeon: Robert Bellow, MD;  Location: [facility] ENDOSCOPY;  Service: Endoscopy;  Laterality: N/A;   EYE SURGERY Bilateral    cataract   LYMPH GLAND EXCISION N/A 02/09/2017   Procedure: CERVICAL LYMPH NODE BIOPSY;  Surgeon: Robert Bellow, MD;  Location: [facility];  Service: General;  Laterality: N/A;   PORTACATH PLACEMENT Right 02/23/2017   Procedure: INSERTION PORT-A-CATH;  Surgeon: Robert Bellow, MD;  Location: [facility] ORS;  Service: General;  Laterality: Right;   PROSTATE SURGERY     TONSILLECTOMY     TRANSURETHRAL RESECTION OF PROSTATE N/A 02/14/2019   Procedure: TRANSURETHRAL RESECTION OF THE PROSTATE (TURP);  Surgeon: [name], MD;  Location: [facility] ORS;  Service: Urology;  Laterality: N/A;    Prior to Admission medications   Medication Sig Start Date End Date Taking? Authorizing Provider  acetaminophen (  TYLENOL) 500 MG tablet Take 1,000 mg by mouth every 6 (six) hours as needed for moderate pain.    [provider]  aspirin 325 MG tablet Take 650 mg by mouth daily.  Patient not taking: No sig reported    [provider]  Azelastine HCl 137 MCG/SPRAY SOLN SMARTSIG:1-2 Spray(s) Both Nares Twice Daily 09/15/19   [provider]  BREZTRI AEROSPHERE 160-9-4.8 MCG/ACT AERO Inhale 2 puffs into the lungs 2 (two) times daily. 02/11/20   [provider]  budesonide (PULMICORT) 0.5 MG/2ML nebulizer solution Take by nebulization 2 (two)  times daily. 09/04/19   [provider]  budesonide-formoterol (SYMBICORT) 160-4.5 MCG/ACT inhaler Inhale 2 puffs into the lungs 2 (two) times daily. Must contact PCP for future refills Patient not taking: Reported on 09/07/2020 06/06/19   [name], MD  calcium-vitamin D (OSCAL WITH D) 500-200 MG-UNIT tablet Take 1 tablet by mouth daily. Patient taking differently: Take 2 tablets by mouth daily. 06/25/18   [name], MD  Chlorpheniramine-Phenylephrine 4-10 MG tablet Take 1 tablet by mouth every 6 (six) hours as needed for allergies.     [provider]  diphenhydrAMINE-zinc acetate (BENADRYL EXTRA STRENGTH) cream Apply 1 application topically 3 (three) times daily as needed for itching. 07/20/17   [name], MD  fluticasone [facility]) 50 MCG/ACT nasal spray Place 1 spray into both nostrils daily. 09/25/18   [name], MD  furosemide (LASIX) 20 MG tablet Take by mouth. 06/09/20 06/09/21  [provider]  Ipratropium-Albuterol (COMBIVENT) 20-100 MCG/ACT AERS respimat Inhale into the lungs. 11/26/19   [provider]  ipratropium-albuterol (DUONEB) 0.5-2.5 (3) MG/3ML SOLN Inhale into the lungs. 11/26/19 11/20/20  [provider]  levothyroxine (SYNTHROID) 150 MCG tablet Take 150 mcg by mouth daily before breakfast.    [provider]  lidocaine-prilocaine (EMLA) cream Apply over port 1-2 hours prior to chemotherapy treatment.  Cover with plastic wrap. 11/07/19   [name], MD  Melatonin 10 MG TABS Take 10 mg by mouth at bedtime.     [provider]  Multiple Vitamin (MULTI-VITAMINS) TABS Take 1 tablet by mouth daily.     [provider]  ondansetron (ZOFRAN) 8 MG tablet Take 1 tablet (8 mg total) by mouth every 12 (twelve) hours as needed for nausea or vomiting. Patient not taking: Reported on 09/07/2020 11/21/18   [name], MD  pantoprazole (PROTONIX) 40 MG tablet Take 1 tablet (40 mg total) by mouth daily. 06/04/19   [name], MD  phentermine  (ADIPEX-P) 37.5 MG tablet Take 37.5 mg by mouth at bedtime. 08/11/20   [provider]  polyethylene glycol powder (GLYCOLAX/MIRALAX) 17 GM/SCOOP powder TAKE AS DIRECTED FOR COLONIC PREP Patient not taking: No sig reported 10/06/19   [provider]  predniSONE (DELTASONE) 5 MG tablet Take by mouth. 07/26/20   [provider]  prochlorperazine (COMPAZINE) 10 MG tablet Take 1 tablet (10 mg total) by mouth every 6 (six) hours as needed for nausea or vomiting. 11/13/17   [name], MD  sertraline (ZOLOFT) 25 MG tablet TAKE 1 TABLET(25 MG) BY MOUTH DAILY 03/10/19   Verlon Au, NP  sulfamethoxazole-trimethoprim (BACTRIM) 400-80 MG tablet SMARTSIG:1 Tablet(s) By Mouth Every 12 Hours Patient not taking: No sig reported 09/29/19   [provider]  tadalafil (CIALIS) 5 MG tablet Take 5-10mg  by mouth as needed prior to intercourse 10/13/20   [name], MD  tamsulosin (FLOMAX) 0.4 MG CAPS capsule Take 1 capsule (0.4 mg  total) by mouth daily. 12/25/18   [name], MD  traMADol (ULTRAM) 50 MG tablet Take 1 tablet (50 mg total) by mouth every 6 (six) hours as needed for moderate pain or severe pain. Patient not taking: No sig reported 07/22/18   [name], MD  traZODone (DESYREL) 100 MG tablet Take 100 mg by mouth at bedtime. 09/16/20   [provider]  triamcinolone ointment (KENALOG) 0.5 % Apply 1 application topically 3 (three) times daily as needed. 05/14/18   [name], MD    Allergies  Allergen Reactions   Morphine And Related     Family History  Problem Relation Age of Onset   Breast cancer Maternal Grandmother    Dementia Mother    Diabetes Father    Stroke Father     Social History Social History   Tobacco Use   Smoking status: Former    Packs/day: 1.00    Years: 53.00    Pack years: 53.00    Types: Cigarettes    Quit date: 04/14/2017    Years since quitting: 3.5   Smokeless tobacco: Never   Tobacco comments:    Quit November 2018   Vaping Use   Vaping Use: Never used  Substance Use Topics   Alcohol use: Not Currently    Comment: occassional   Drug use: No    Review of Systems Constitutional: Negative for fever.  Feeling off balance, now resolved Cardiovascular: Negative for chest pain. Respiratory: Negative for shortness of breath. Gastrointestinal: Left lower quadrant abdominal pain resolved after bowel movement. Musculoskeletal: Negative for musculoskeletal complaints Neurological: Negative for headache.  Denies weakness numbness confusion or slurred speech. All other ROS negative  ____________________________________________   PHYSICAL EXAM:  VITAL SIGNS: ED Triage Vitals  Enc Vitals Group     BP 11/08/20 1505 125/74     Pulse Rate 11/08/20 1505 85     Resp 11/08/20 1505 20     Temp 11/08/20 1505 97.6 F (36.4 C)     Temp Source 11/08/20 1505 Oral     SpO2 11/08/20 1505 100 %     Weight 11/08/20 1506 239 lb (108.4 kg)     Height 11/08/20 1506 6\' 1"  (1.854 m)     Head Circumference --      Peak Flow --      Pain Score 11/08/20 1506 3     Pain Loc --      Pain Edu? --      Excl. in [place]? --    Constitutional: Alert and oriented. Well appearing and in no distress. Eyes: Normal exam ENT      Head: Normocephalic and atraumatic.      Mouth/Throat: Mucous membranes are moist. Cardiovascular: Normal rate, regular rhythm.  Respiratory: Normal respiratory effort without tachypnea nor retractions. Breath sounds are clear  Gastrointestinal: Soft and nontender. No distention.   Musculoskeletal: Nontender with normal range of motion in all extremities.  Neurologic:  Normal speech and language. No gross focal neurologic deficits.  Equal grip strength bilaterally.   Skin:  Skin is warm, dry and intact.  Psychiatric: Mood and affect are normal. Speech and behavior are normal.   ____________________________________________    EKG  EKG viewed and interpreted by myself shows a normal sinus rhythm at 72  bpm with a narrow QRS, normal axis, normal intervals, nonspecific ST changes.  No ST elevation.  ____________________________________________    RADIOLOGY  CT scan of the head is negative for acute abnormality.  ____________________________________________  INITIAL IMPRESSION / ASSESSMENT AND PLAN / ED COURSE  Pertinent labs & imaging results that were available during my care of the patient were reviewed by me and considered in my medical decision making (see chart for details).   Patient presents to the emergency department for abdominal discomfort that resolved after a bowel movement.  Denies any abdominal pain, completely benign abdominal exam.  Reassuring/baseline lab work.  Patient does state feeling off balance but states this has been ongoing x1 year.  We will obtain a CT scan of the head as a precaution to rule out mass tumor or stroke.  We will add on a troponin and continue to closely monitor.  Patient appears well otherwise.  I did discuss with the patient trial of taking a daily stool softener as he states this is a chronic issue for him where he will not have a bowel movement for approximately 4 to 5 days and then have a very large bowel movement.  Patient's labs are largely within normal limits.  Troponin of 18.  No chest pain.  CT scan is negative.  Patient unable to provide a urine sample though states he does not need to urinate and has no urinary symptoms.  We will discharge patient home with PCP follow-up.  Patient agreeable to plan of care.  Remains abdominal pain-free.  Radames Mejorado Gray was evaluated in Emergency Department on 11/08/2020 for the symptoms described in the history of present illness. He was evaluated in the context of the global COVID-19 pandemic, which necessitated consideration that the patient might be at risk for infection with the SARS-CoV-2 virus that causes COVID-19. Institutional protocols and algorithms that pertain to the evaluation of  patients at risk for COVID-19 are in a state of rapid change based on information released by regulatory bodies including the CDC and federal and state organizations. These policies and algorithms were followed during the patient's care in the ED.  ____________________________________________   FINAL CLINICAL IMPRESSION(S) / ED DIAGNOSES  Abdominal pain Constipation Ataxia   Harvest Dark, MD 11/08/20 1909

## 2020-11-08 NOTE — ED Notes (Signed)
Pt still attempting to void in urinal. Unable to void so far.

## 2020-11-08 NOTE — ED Notes (Signed)
Pt attempting to void in urinal for sample.

## 2020-11-08 NOTE — ED Triage Notes (Signed)
First Nurse Note:  Arrives from [facility] for evaluation.  Patient c/o constipation x 2 hours and unsteady gait "for a while".  AAOx3. Skin warm and dry. MAE equally and strong.  Ambulates with steady gait with cane.  NAD

## 2020-11-21 DIAGNOSIS — J439 Emphysema, unspecified: Secondary | ICD-10-CM | POA: Diagnosis not present

## 2020-11-23 DIAGNOSIS — J449 Chronic obstructive pulmonary disease, unspecified: Secondary | ICD-10-CM | POA: Diagnosis not present

## 2020-12-08 DIAGNOSIS — K5900 Constipation, unspecified: Secondary | ICD-10-CM | POA: Diagnosis not present

## 2020-12-08 DIAGNOSIS — G4700 Insomnia, unspecified: Secondary | ICD-10-CM | POA: Diagnosis not present

## 2020-12-08 DIAGNOSIS — R42 Dizziness and giddiness: Secondary | ICD-10-CM | POA: Diagnosis not present

## 2020-12-08 DIAGNOSIS — K219 Gastro-esophageal reflux disease without esophagitis: Secondary | ICD-10-CM | POA: Diagnosis not present

## 2020-12-21 DIAGNOSIS — J439 Emphysema, unspecified: Secondary | ICD-10-CM | POA: Diagnosis not present

## 2020-12-28 ENCOUNTER — Inpatient Hospital Stay: Payer: PPO | Attending: Oncology

## 2020-12-28 DIAGNOSIS — Z452 Encounter for adjustment and management of vascular access device: Secondary | ICD-10-CM | POA: Insufficient documentation

## 2020-12-28 DIAGNOSIS — C3492 Malignant neoplasm of unspecified part of left bronchus or lung: Secondary | ICD-10-CM | POA: Insufficient documentation

## 2020-12-28 DIAGNOSIS — Z95828 Presence of other vascular implants and grafts: Secondary | ICD-10-CM

## 2020-12-28 MED ORDER — SODIUM CHLORIDE 0.9% FLUSH
10.0000 mL | INTRAVENOUS | Status: DC | PRN
  Administered 2020-12-28: 10 mL via INTRAVENOUS
  Filled 2020-12-28: qty 10

## 2020-12-28 MED ORDER — HEPARIN SOD (PORK) LOCK FLUSH 100 UNIT/ML IV SOLN
INTRAVENOUS | Status: AC
  Filled 2020-12-28: qty 5

## 2020-12-28 MED ORDER — HEPARIN SOD (PORK) LOCK FLUSH 100 UNIT/ML IV SOLN
500.0000 [IU] | Freq: Once | INTRAVENOUS | Status: AC
Start: 2020-12-28 — End: 2020-12-28
  Administered 2020-12-28: 500 [IU] via INTRAVENOUS
  Filled 2020-12-28: qty 5

## 2021-01-18 DIAGNOSIS — E785 Hyperlipidemia, unspecified: Secondary | ICD-10-CM | POA: Diagnosis not present

## 2021-01-18 DIAGNOSIS — E039 Hypothyroidism, unspecified: Secondary | ICD-10-CM | POA: Diagnosis not present

## 2021-01-18 DIAGNOSIS — E2749 Other adrenocortical insufficiency: Secondary | ICD-10-CM | POA: Diagnosis not present

## 2021-01-21 DIAGNOSIS — J439 Emphysema, unspecified: Secondary | ICD-10-CM | POA: Diagnosis not present

## 2021-01-25 DIAGNOSIS — E2749 Other adrenocortical insufficiency: Secondary | ICD-10-CM | POA: Diagnosis not present

## 2021-01-25 DIAGNOSIS — E039 Hypothyroidism, unspecified: Secondary | ICD-10-CM | POA: Diagnosis not present

## 2021-02-21 DIAGNOSIS — J439 Emphysema, unspecified: Secondary | ICD-10-CM | POA: Diagnosis not present

## 2021-02-22 ENCOUNTER — Inpatient Hospital Stay: Payer: PPO | Attending: Oncology

## 2021-02-22 DIAGNOSIS — Z452 Encounter for adjustment and management of vascular access device: Secondary | ICD-10-CM | POA: Diagnosis not present

## 2021-02-22 DIAGNOSIS — Z95828 Presence of other vascular implants and grafts: Secondary | ICD-10-CM

## 2021-02-22 DIAGNOSIS — C3492 Malignant neoplasm of unspecified part of left bronchus or lung: Secondary | ICD-10-CM | POA: Diagnosis not present

## 2021-02-22 MED ORDER — HEPARIN SOD (PORK) LOCK FLUSH 100 UNIT/ML IV SOLN
500.0000 [IU] | Freq: Once | INTRAVENOUS | Status: DC
  Filled 2021-02-22: qty 5

## 2021-02-22 MED ORDER — HEPARIN SOD (PORK) LOCK FLUSH 100 UNIT/ML IV SOLN
INTRAVENOUS | Status: AC
  Administered 2021-02-22: 500 [IU]
  Filled 2021-02-22: qty 5

## 2021-02-22 MED ORDER — SODIUM CHLORIDE 0.9% FLUSH
10.0000 mL | Freq: Once | INTRAVENOUS | Status: AC
  Administered 2021-02-22: 10 mL via INTRAVENOUS
  Filled 2021-02-22: qty 10

## 2021-03-07 ENCOUNTER — Other Ambulatory Visit: Payer: Self-pay

## 2021-03-07 ENCOUNTER — Ambulatory Visit
Admission: RE | Admit: 2021-03-07 | Discharge: 2021-03-07 | Disposition: A | Discharge status: Home / Self Care | Payer: PPO | Source: Ambulatory Visit | Attending: Oncology | Admitting: Oncology

## 2021-03-07 DIAGNOSIS — C3492 Malignant neoplasm of unspecified part of left bronchus or lung: Secondary | ICD-10-CM | POA: Diagnosis not present

## 2021-03-07 DIAGNOSIS — R911 Solitary pulmonary nodule: Secondary | ICD-10-CM | POA: Diagnosis not present

## 2021-03-07 DIAGNOSIS — C3490 Malignant neoplasm of unspecified part of unspecified bronchus or lung: Secondary | ICD-10-CM | POA: Diagnosis not present

## 2021-03-07 DIAGNOSIS — I700 Atherosclerosis of aorta: Secondary | ICD-10-CM | POA: Diagnosis not present

## 2021-03-07 DIAGNOSIS — J439 Emphysema, unspecified: Secondary | ICD-10-CM | POA: Diagnosis not present

## 2021-03-07 DIAGNOSIS — I3139 Other pericardial effusion (noninflammatory): Secondary | ICD-10-CM | POA: Diagnosis not present

## 2021-03-09 DIAGNOSIS — Z03818 Encounter for observation for suspected exposure to other biological agents ruled out: Secondary | ICD-10-CM | POA: Diagnosis not present

## 2021-03-09 DIAGNOSIS — J208 Acute bronchitis due to other specified organisms: Secondary | ICD-10-CM | POA: Diagnosis not present

## 2021-03-09 DIAGNOSIS — U071 COVID-19: Secondary | ICD-10-CM | POA: Diagnosis not present

## 2021-03-10 ENCOUNTER — Inpatient Hospital Stay: Payer: PPO

## 2021-03-10 ENCOUNTER — Inpatient Hospital Stay: Payer: PPO | Admitting: Oncology

## 2021-03-21 ENCOUNTER — Other Ambulatory Visit: Payer: Self-pay

## 2021-03-21 DIAGNOSIS — C3492 Malignant neoplasm of unspecified part of left bronchus or lung: Secondary | ICD-10-CM

## 2021-03-21 DIAGNOSIS — C61 Malignant neoplasm of prostate: Secondary | ICD-10-CM

## 2021-03-22 ENCOUNTER — Inpatient Hospital Stay: Payer: PPO | Attending: Oncology

## 2021-03-22 ENCOUNTER — Other Ambulatory Visit: Payer: Self-pay

## 2021-03-22 ENCOUNTER — Inpatient Hospital Stay (HOSPITAL_BASED_OUTPATIENT_CLINIC_OR_DEPARTMENT_OTHER): Payer: PPO | Admitting: Oncology

## 2021-03-22 ENCOUNTER — Encounter: Payer: Self-pay | Admitting: Oncology

## 2021-03-22 VITALS — BP 137/67 | HR 65 | Temp 97.9°F | Resp 18 | Wt 241.7 lb

## 2021-03-22 DIAGNOSIS — Z9079 Acquired absence of other genital organ(s): Secondary | ICD-10-CM | POA: Diagnosis not present

## 2021-03-22 DIAGNOSIS — E032 Hypothyroidism due to medicaments and other exogenous substances: Secondary | ICD-10-CM

## 2021-03-22 DIAGNOSIS — E2740 Unspecified adrenocortical insufficiency: Secondary | ICD-10-CM | POA: Insufficient documentation

## 2021-03-22 DIAGNOSIS — C3492 Malignant neoplasm of unspecified part of left bronchus or lung: Secondary | ICD-10-CM | POA: Diagnosis not present

## 2021-03-22 DIAGNOSIS — Z6831 Body mass index (BMI) 31.0-31.9, adult: Secondary | ICD-10-CM

## 2021-03-22 DIAGNOSIS — E661 Drug-induced obesity: Secondary | ICD-10-CM | POA: Diagnosis not present

## 2021-03-22 DIAGNOSIS — Z7982 Long term (current) use of aspirin: Secondary | ICD-10-CM | POA: Insufficient documentation

## 2021-03-22 DIAGNOSIS — Z923 Personal history of irradiation: Secondary | ICD-10-CM | POA: Insufficient documentation

## 2021-03-22 DIAGNOSIS — D472 Monoclonal gammopathy: Secondary | ICD-10-CM | POA: Insufficient documentation

## 2021-03-22 DIAGNOSIS — Z79899 Other long term (current) drug therapy: Secondary | ICD-10-CM | POA: Diagnosis not present

## 2021-03-22 DIAGNOSIS — Z85118 Personal history of other malignant neoplasm of bronchus and lung: Secondary | ICD-10-CM | POA: Insufficient documentation

## 2021-03-22 DIAGNOSIS — N1832 Chronic kidney disease, stage 3b: Secondary | ICD-10-CM | POA: Diagnosis not present

## 2021-03-22 DIAGNOSIS — Z9221 Personal history of antineoplastic chemotherapy: Secondary | ICD-10-CM | POA: Diagnosis not present

## 2021-03-22 DIAGNOSIS — C61 Malignant neoplasm of prostate: Secondary | ICD-10-CM | POA: Diagnosis not present

## 2021-03-22 DIAGNOSIS — E860 Dehydration: Secondary | ICD-10-CM | POA: Diagnosis not present

## 2021-03-22 DIAGNOSIS — Z8546 Personal history of malignant neoplasm of prostate: Secondary | ICD-10-CM | POA: Diagnosis not present

## 2021-03-22 LAB — CBC WITH DIFFERENTIAL/PLATELET
Abs Immature Granulocytes: 0.04 10*3/uL (ref 0.00–0.07)
Basophils Absolute: 0 10*3/uL (ref 0.0–0.1)
Basophils Relative: 1 %
Eosinophils Absolute: 0.1 10*3/uL (ref 0.0–0.5)
Eosinophils Relative: 2 %
HCT: 41.1 % (ref 39.0–52.0)
Hemoglobin: 13.2 g/dL (ref 13.0–17.0)
Immature Granulocytes: 1 %
Lymphocytes Relative: 19 %
Lymphs Abs: 1.1 10*3/uL (ref 0.7–4.0)
MCH: 30.5 pg (ref 26.0–34.0)
MCHC: 32.1 g/dL (ref 30.0–36.0)
MCV: 94.9 fL (ref 80.0–100.0)
Monocytes Absolute: 0.8 10*3/uL (ref 0.1–1.0)
Monocytes Relative: 13 %
Neutro Abs: 4 10*3/uL (ref 1.7–7.7)
Neutrophils Relative %: 64 %
Platelets: 174 10*3/uL (ref 150–400)
RBC: 4.33 MIL/uL (ref 4.22–5.81)
RDW: 15.9 % — ABNORMAL HIGH (ref 11.5–15.5)
WBC: 6.1 10*3/uL (ref 4.0–10.5)
nRBC: 0 % (ref 0.0–0.2)

## 2021-03-22 LAB — COMPREHENSIVE METABOLIC PANEL
ALT: 16 U/L (ref 0–44)
AST: 20 U/L (ref 15–41)
Albumin: 3.6 g/dL (ref 3.5–5.0)
Alkaline Phosphatase: 39 U/L (ref 38–126)
Anion gap: 6 (ref 5–15)
BUN: 31 mg/dL — ABNORMAL HIGH (ref 8–23)
CO2: 25 mmol/L (ref 22–32)
Calcium: 8.3 mg/dL — ABNORMAL LOW (ref 8.9–10.3)
Chloride: 106 mmol/L (ref 98–111)
Creatinine, Ser: 1.6 mg/dL — ABNORMAL HIGH (ref 0.61–1.24)
GFR, Estimated: 45 mL/min — ABNORMAL LOW (ref >60)
Glucose, Bld: 105 mg/dL — ABNORMAL HIGH (ref 70–99)
Potassium: 3.5 mmol/L (ref 3.5–5.1)
Sodium: 137 mmol/L (ref 135–145)
Total Bilirubin: 0.5 mg/dL (ref 0.3–1.2)
Total Protein: 6.4 g/dL — ABNORMAL LOW (ref 6.5–8.1)

## 2021-03-22 MED ORDER — ASPIRIN EC 81 MG PO TBEC: 81.0000 mg | DELAYED_RELEASE_TABLET | Freq: Every day | ORAL | 0 refills | Status: AC

## 2021-03-22 NOTE — Progress Notes (Signed)
Hematology/Oncology Follow Up Note [facility] Telephone:([phone] [phone]   Patient Care Team: [name], MD as PCP - General (Family Medicine) [name], [name], MD (General Surgery) [name], MD as Consulting Physician (Oncology) [name], RN as Registered Nurse  Reason for Visit:  Follow up for lung cancer, immunotherapy side effects, adrenal insufficiency.  HISTORY OF PRESENTING ILLNESS:  Marc Gray 75 y.o.  male who presents for treatment of Stage IIIB (cT2b, cN3, cM0)  Adenocarcinoma of lung.  # Patient is s/p [place] and taxol concurrent RT for treatment of Stage IIIB Lung cancer. He recieved additional lung boost RT finished one year of Durvalumab -January 2020  # Colon CA was listed in his history, however reviewing his surgical pathology from colonoscopy on 04/15/2014, during which he had polyps removed and all are negative for cancer.patient is scheduled for colonoscopy screening this year.  # 5/1/ 2019 CT scan was discussed at tumor board and changes consistent with radiation changes  # TURP procedure done on 02/14/2019.  Pathology showed prostate adenocarcinoma, Gleason score 3+3, tumor quantitation, estimated percentage of prostate tissue involved by tumor less than 5%. No perineural invasion identified. Patient follows up with urologist and was recommended to observe.  INTERVAL HISTORY Patient he presents for follow-up of fo stage IIIB adenocarcinoma of the lung. Patient reports feeling well.  He has a rash/spot on his back that is itching and he has an appointment with dermatology. He had COVID 3 weeks ago. He is on chronic steroid for adrenal insufficiency.  He follows up with Dr. Gabriel Carina.  Review of Systems  Constitutional:  Negative for appetite change, chills, fatigue, fever and unexpected weight change.  HENT:   Negative for hearing loss and voice change.   Eyes:  Negative for eye problems and  icterus.  Respiratory:  Negative for chest tightness, cough and shortness of breath.   Cardiovascular:  Negative for chest pain and leg swelling.  Gastrointestinal:  Negative for abdominal distention, abdominal pain and blood in stool.  Endocrine: Negative for hot flashes.  Genitourinary:  Negative for difficulty urinating, dysuria and frequency.   Musculoskeletal:  Negative for arthralgias and back pain.  Skin:  Negative for itching and rash.  Neurological:  Negative for extremity weakness, light-headedness and numbness.  Hematological:  Negative for adenopathy. Does not bruise/bleed easily.  Psychiatric/Behavioral:  Negative for confusion.    MEDICAL HISTORY:  Past Medical History:  Diagnosis Date   Anxiety    Aortic atherosclerosis (HCC)    Arthritis    SHOULDER   BPH (benign prostatic hyperplasia)    Cataract    Chronic kidney disease    low kidney function   Colon adenomas    Colon cancer ([place]) 2015   Pt states during his colonoscopy he had cancerous polyps removed.    COPD (chronic obstructive pulmonary disease) (HCC)    Depression    SINCE DIAGNOSIS   Dyspnea    DOE   Enlarged prostate    GERD (gastroesophageal reflux disease)    Hypertension    Hypothyroidism    Lung cancer ([place])    Aug 2018   Pain    DUE TO LUNG ISSUE    SURGICAL HISTORY: Past Surgical History:  Procedure Laterality Date   BACK SURGERY     kyphoplasty  T5-6   CATARACT EXTRACTION W/ INTRAOCULAR LENS  IMPLANT, BILATERAL     COLONOSCOPY     COLONOSCOPY WITH PROPOFOL N/A 12/17/2019   Procedure: COLONOSCOPY WITH  PROPOFOL;  Surgeon: Robert Bellow, MD;  Location[facility] END[facility]COPY;  Service: Endoscopy;  Laterality: N/A;   EYE SURGERY Bilateral    cataract   LYMPH GLAND EXCISION N/A 02/09/2017   Procedure: CERVICAL LYMPH NODE BIOPSY;  Surgeon: Robert Bellow, MD;  Location: [facility] ORS;  Service: General;  Laterality: N/A;   PORTACATH PLACEMENT Right 02/23/2017   Procedure: INSERTION  PORT-A-CATH;  Surgeon: Robert Bellow, MD;  Location: [facility] ORS;  Service: General;  Laterality: Right;   PROSTATE SURGERY     TONSILLECTOMY     TRANSURETHRAL RESECTION OF PROSTATE N/A 02/14/2019   Procedure: TRANSURETHRAL RESECTION OF THE PROSTATE (TURP);  Surgeon: [name], MD;  Location: [facility] ORS;  Service: Urology;  Laterality: N/A;    SOCIAL HISTORY: Social History   Socioeconomic History   Marital status: Married    Spouse name: Not on file   Number of children: Not on file   Years of education: Not on file   Highest education level: Not on file  Occupational History   Not on file  Tobacco Use   Smoking status: Former    Packs/day: 1.00    Years: 53.00    Pack years: 53.00    Types: Cigarettes    Quit date: 04/14/2017    Years since quitting: 3.9   Smokeless tobacco: Never   Tobacco comments:    Quit November 2018  Vaping Use   Vaping Use: Never used  Substance and Sexual Activity   Alcohol use: Not Currently    Comment: occassional   Drug use: No   Sexual activity: Yes  Other Topics Concern   Not on file  Social History Narrative   Not on file   Social Determinants of Health   Financial Resource Strain: Not on file  Food Insecurity: Not on file  Transportation Needs: Not on file  Physical Activity: Not on file  Stress: Not on file  Social Connections: Not on file  Intimate Partner Violence: Not on file    FAMILY HISTORY: Family History  Problem Relation Age of Onset   Breast cancer Maternal Grandmother    Dementia Mother    Diabetes Father    Stroke Father     ALLERGIES:  is allergic to morphine and related.  MEDICATIONS:  Current Outpatient Medications  Medication Sig Dispense Refill   acetaminophen (TYLENOL) 500 MG tablet Take 1,000 mg by mouth every 6 (six) hours as needed for moderate pain.     aspirin EC 81 MG tablet Take 1 tablet (81 mg total) by mouth daily. Swallow whole. 90 tablet 0   Azelastine HCl 137 MCG/SPRAY SOLN  SMARTSIG:1-2 Spray(s) Both Nares Twice Daily     BREZTRI AEROSPHERE 160-9-4.8 MCG/ACT AERO Inhale 2 puffs into the lungs 2 (two) times daily.     budesonide (PULMICORT) 0.5 MG/2ML nebulizer solution Take by nebulization 2 (two) times daily.     budesonide-formoterol (SYMBICORT) 160-4.5 MCG/ACT inhaler Inhale 2 puffs into the lungs 2 (two) times daily. Must contact PCP for future refills 1 Inhaler 0   calcium-vitamin D (OSCAL WITH D) 500-200 MG-UNIT tablet Take 1 tablet by mouth daily. (Patient taking differently: Take 2 tablets by mouth daily.) 90 tablet 1   diphenhydrAMINE-zinc acetate (BENADRYL EXTRA STRENGTH) cream Apply 1 application topically 3 (three) times daily as needed for itching. 28 g 0   fluticasone (FLONASE) 50 MCG/ACT nasal spray Place 1 spray into both nostrils daily. 16 g 2   furosemide (LASIX) 20 MG tablet Take  by mouth.     Ipratropium-Albuterol (COMBIVENT) 20-100 MCG/ACT AERS respimat Inhale into the lungs.     ipratropium-albuterol (DUONEB) 0.5-2.5 (3) MG/3ML SOLN Inhale into the lungs.     levothyroxine (SYNTHROID) 150 MCG tablet Take 150 mcg by mouth daily before breakfast.     lidocaine-prilocaine (EMLA) cream Apply over port 1-2 hours prior to chemotherapy treatment.  Cover with plastic wrap. 30 g 1   Melatonin 10 MG TABS Take 10 mg by mouth at bedtime.      Multiple Vitamin (MULTI-VITAMINS) TABS Take 1 tablet by mouth daily.      ondansetron (ZOFRAN) 8 MG tablet Take 1 tablet (8 mg total) by mouth every 12 (twelve) hours as needed for nausea or vomiting. 60 tablet 0   pantoprazole (PROTONIX) 40 MG tablet Take 1 tablet (40 mg total) by mouth daily. 90 tablet 0   predniSONE (DELTASONE) 5 MG tablet Take by mouth.     prochlorperazine (COMPAZINE) 10 MG tablet Take 1 tablet (10 mg total) by mouth every 6 (six) hours as needed for nausea or vomiting. 60 tablet 2   sertraline (ZOLOFT) 25 MG tablet TAKE 1 TABLET(25 MG) BY MOUTH DAILY 90 tablet 0   tadalafil (CIALIS) 5 MG tablet  Take 5-42m by mouth as needed prior to intercourse 30 tablet 11   tamsulosin (FLOMAX) 0.4 MG CAPS capsule Take 1 capsule (0.4 mg total) by mouth daily. 90 capsule 1   traZODone (DESYREL) 100 MG tablet Take 100 mg by mouth at bedtime.     triamcinolone ointment (KENALOG) 0.5 % Apply 1 application topically 3 (three) times daily as needed. 30 g 1   Chlorpheniramine-Phenylephrine 4-10 MG tablet Take 1 tablet by mouth every 6 (six) hours as needed for allergies.  (Patient not taking: Reported on 03/22/2021)     phentermine (ADIPEX-P) 37.5 MG tablet Take 37.5 mg by mouth at bedtime. (Patient not taking: Reported on 03/22/2021)     polyethylene glycol powder (GLYCOLAX/MIRALAX) 17 GM/SCOOP powder TAKE AS DIRECTED FOR COLONIC PREP (Patient not taking: No sig reported)     sulfamethoxazole-trimethoprim (BACTRIM) 400-80 MG tablet SMARTSIG:1 Tablet(s) By Mouth Every 12 Hours (Patient not taking: No sig reported)     traMADol (ULTRAM) 50 MG tablet Take 1 tablet (50 mg total) by mouth every 6 (six) hours as needed for moderate pain or severe pain. (Patient not taking: No sig reported) 30 tablet 0   No current facility-administered medications for this visit.   Facility-Administered Medications Ordered in Other Visits  Medication Dose Route Frequency Provider Last Rate Last Admin   sodium chloride flush (NS) 0.9 % injection 10 mL  10 mL Intravenous PRN YEarlie Server MD   10 mL at 04/03/17 06503     .  PHYSICAL EXAMINATION: ECOG PERFORMANCE STATUS: 1 - Symptomatic but completely ambulatory Vitals:   03/22/21 1101  BP: 137/67  Pulse: 65  Resp: 18  Temp: 97.9 F (36.6 C)  SpO2: 98%   Filed Weights   03/22/21 1101  Weight: 241 lb 11.2 oz (109.6 kg)  Physical Exam Constitutional:      General: He is not in acute distress.    Appearance: He is not diaphoretic.  HENT:     Head: Normocephalic and atraumatic.     Nose: Nose normal.     Mouth/Throat:     Pharynx: No oropharyngeal exudate.  Eyes:      General: No scleral icterus.    Pupils: Pupils are equal, round, and reactive to light.  Cardiovascular:     Rate and Rhythm: Normal rate and regular rhythm.     Heart sounds: No murmur heard. Pulmonary:     Effort: Pulmonary effort is normal. No respiratory distress.     Breath sounds: No rales.  Chest:     Chest wall: No tenderness.  Abdominal:     General: There is no distension.     Palpations: Abdomen is soft.     Tenderness: There is no abdominal tenderness.  Musculoskeletal:        General: Normal range of motion.     Cervical back: Normal range of motion and neck supple.  Skin:    General: Skin is warm and dry.  Neurological:     Mental Status: He is alert and oriented to person, place, and time.     Cranial Nerves: No cranial nerve deficit.     Motor: No abnormal muscle tone.     Coordination: Coordination normal.  Psychiatric:        Mood and Affect: Affect normal.   LABORATORY DATA:  I have reviewed the data as listed Lab Results  Component Value Date   WBC 6.1 03/22/2021   HGB 13.2 03/22/2021   HCT 41.1 03/22/2021   MCV 94.9 03/22/2021   PLT 174 03/22/2021   Recent Labs    09/06/20 1009 11/08/20 1509 03/22/21 1004  NA 138 137 137  K 3.8 3.5 3.5  CL 103 102 106  CO2 26 28 25   GLUCOSE 100* 89 105*  BUN 28* 28* 31*  CREATININE 1.66* 1.60* 1.60*  CALCIUM 8.9 9.2 8.3*  GFRNONAA 43* 45* 45*  PROT 6.9 6.7 6.4*  ALBUMIN 4.0 3.9 3.6  AST 24 23 20   ALT 27 22 16   ALKPHOS 50 44 39  BILITOT 0.7 0.9 0.5   RADIOGRAPHIC STUDIES: I have personally reviewed the radiological images as listed and agreed with the findings in the report. CT Chest Wo Contrast  Result Date: 03/08/2021 CLINICAL DATA:  Follow-up left lung cancer EXAM: CT CHEST WITHOUT CONTRAST TECHNIQUE: Multidetector CT imaging of the chest was performed following the standard protocol without IV contrast. COMPARISON:  09/03/2020 FINDINGS: Cardiovascular: Right chest port catheter. Aortic  atherosclerosis. Normal heart size. Three-vessel coronary artery calcifications. Unchanged trace pericardial effusion. Mediastinum/Nodes: No enlarged mediastinal, hilar, or axillary lymph nodes. Thyroid gland, trachea, and esophagus demonstrate no significant findings. Lungs/Pleura: Moderate centrilobular emphysema. Unchanged post treatment/post radiation fibrosis and consolidation of the perihilar left lung and paramedian left upper lobe (series 3, image 43). Unchanged 4 mm nodule of the right lower lobe (series 3, image 82). No pleural effusion or pneumothorax. Upper Abdomen: No acute abnormality.  Severely atrophic left kidney. Musculoskeletal: No chest wall mass or suspicious bone lesions identified. Unchanged wedge and superior endplate deformities of the upper thoracic spine, including vertebral cement augmentation of T5 and T7. IMPRESSION: 1. Unchanged post treatment/post radiation fibrosis and consolidation of the perihilar left lung and paramedian left upper lobe. 2. Unchanged 4 mm nodule of the right lower lobe, almost certainly benign and incidental. 3. Emphysema. 4. Coronary artery disease. Aortic Atherosclerosis (ICD10-I70.0). Electronically Signed   By: Delanna Ahmadi M.D.   On: 03/08/2021 10:04    ASSESSMENT & PLAN:  Cancer Staging Adenocarcinoma of left lung, stage 3 ([place]) Staging form: Lung, AJCC 8th Edition - Clinical stage from 02/14/2017: Stage IIIB (cT2b, cN3, cM0) - Signed by [name], MD on 02/14/2017  1. Adenocarcinoma of left lung, stage 3 ([place])   2. MGUS (monoclonal  gammopathy of unknown significance)   3. Adrenal insufficiency ([place])   4. Hypothyroidism due to medication   5. Prostate cancer ([place])   6. Class 1 drug-induced obesity without serious comorbidity with body mass index (BMI) of 31.0 to 31.9 in adult    #Stage IIIB lung adenocarcinoma, s/p concurrent chemo/RT and immunotherapy, finished all treatment in January 2020 03/07/2021, CT without contrast showed unchanged  posttreatment/postradiation fibrosis and consolidation of the perihilar left lung and a paramedian left upper lobe.  Unchanged 4 mm nodules of the right lower lobe.  Almost certainly benign and incidental.  Emphysema.  Coronary artery disease. Recommend patient to continue CT surveillance every 6 months.  He agrees with the plan. Labs reviewed and discussed with patient.    #Hypothyroidism and adrenal insufficiency.  Patient is on steroid. Chronic steroid use, osteopenia.  Continue calcium and Vitamin D Continue follow-up with endocrinologist.  Patient is on Synthroid  #Weight gain/obesity secondary to chronic steroid use.  Lifestyle modification discussed with patient.  I will defer to endocrinology for decision on weight loss intervention/medication.  Encourage patient to further discuss with Dr. Gabriel Carina Recommend patient to start aspirin 81 mg daily.  # MGUS,  multiple myeloma panel is stable.  M protein has been stable at 0.2 at last visit.  Light chain ratio is stable.  Multiple myeloma panel was checked today.  Results were pending. #CKD, avoid NSAIDs. #Prostate adenocarcinoma, Gleason score 6, currently on observation.  Last PSA 11/01/2020, 0.72.  Continue follow-up with urology for  #Port-A-Cath in place, we discussed about option of port discontinuation.  Patient prefers to keep the port.  Continue port flush every 8 weeks.  Repeat CT scan in 6 months and follow-up in the clinic, lab MD and review images. Orders Placed This Encounter  Procedures   CT CHEST ABDOMEN PELVIS WO CONTRAST    Standing Status:   Future    Standing Expiration Date:   03/22/2022    Order Specific Question:   If indicated for the ordered procedure, I authorize the administration of contrast media per Radiology protocol    Answer:   Yes    Order Specific Question:   Preferred imaging location?    Answer:   [facility]    Order Specific Question:   Is Oral Contrast requested for this exam?    Answer:   Yes,  Per Radiology protocol    Order Specific Question:   Reason for Exam (SYMPTOM  OR DIAGNOSIS REQUIRED)    Answer:   Lung cancer follow up     [name], MD, PhD  03/22/2021

## 2021-03-22 NOTE — Progress Notes (Signed)
Patient here for follow up. Pt reports that he has a rash and itching to back and some spots that he is concerned about. He has an appt with dermatology. Pt reports that he had covid 3 weeks ago.

## 2021-03-23 DIAGNOSIS — J449 Chronic obstructive pulmonary disease, unspecified: Secondary | ICD-10-CM | POA: Diagnosis not present

## 2021-03-23 DIAGNOSIS — J439 Emphysema, unspecified: Secondary | ICD-10-CM | POA: Diagnosis not present

## 2021-03-23 LAB — KAPPA/LAMBDA LIGHT CHAINS
Kappa free light chain: 52.9 mg/L — ABNORMAL HIGH (ref 3.3–19.4)
Kappa, lambda light chain ratio: 1.98 — ABNORMAL HIGH (ref 0.26–1.65)
Lambda free light chains: 26.7 mg/L — ABNORMAL HIGH (ref 5.7–26.3)

## 2021-03-24 LAB — MULTIPLE MYELOMA PANEL, SERUM
Albumin SerPl Elph-Mcnc: 3.2 g/dL (ref 2.9–4.4)
Albumin/Glob SerPl: 1.2 (ref 0.7–1.7)
Alpha 1: 0.2 g/dL (ref 0.0–0.4)
Alpha2 Glob SerPl Elph-Mcnc: 0.7 g/dL (ref 0.4–1.0)
B-Globulin SerPl Elph-Mcnc: 1 g/dL (ref 0.7–1.3)
Gamma Glob SerPl Elph-Mcnc: 0.8 g/dL (ref 0.4–1.8)
Globulin, Total: 2.7 g/dL (ref 2.2–3.9)
IgA: 233 mg/dL (ref 61–437)
IgG (Immunoglobin G), Serum: 781 mg/dL (ref 603–1613)
IgM (Immunoglobulin M), Srm: 76 mg/dL (ref 15–143)
M Protein SerPl Elph-Mcnc: 0.3 g/dL — ABNORMAL HIGH
Total Protein ELP: 5.9 g/dL — ABNORMAL LOW (ref 6.0–8.5)

## 2021-03-29 DIAGNOSIS — I700 Atherosclerosis of aorta: Secondary | ICD-10-CM | POA: Diagnosis not present

## 2021-03-29 DIAGNOSIS — M1990 Unspecified osteoarthritis, unspecified site: Secondary | ICD-10-CM | POA: Diagnosis not present

## 2021-03-29 DIAGNOSIS — M5442 Lumbago with sciatica, left side: Secondary | ICD-10-CM | POA: Diagnosis not present

## 2021-03-29 DIAGNOSIS — Z0000 Encounter for general adult medical examination without abnormal findings: Secondary | ICD-10-CM | POA: Diagnosis not present

## 2021-03-29 DIAGNOSIS — Z23 Encounter for immunization: Secondary | ICD-10-CM | POA: Diagnosis not present

## 2021-03-29 DIAGNOSIS — N1832 Chronic kidney disease, stage 3b: Secondary | ICD-10-CM | POA: Diagnosis not present

## 2021-04-05 DIAGNOSIS — L728 Other follicular cysts of the skin and subcutaneous tissue: Secondary | ICD-10-CM | POA: Diagnosis not present

## 2021-04-05 DIAGNOSIS — D2261 Melanocytic nevi of right upper limb, including shoulder: Secondary | ICD-10-CM | POA: Diagnosis not present

## 2021-04-05 DIAGNOSIS — D2262 Melanocytic nevi of left upper limb, including shoulder: Secondary | ICD-10-CM | POA: Diagnosis not present

## 2021-04-05 DIAGNOSIS — D2272 Melanocytic nevi of left lower limb, including hip: Secondary | ICD-10-CM | POA: Diagnosis not present

## 2021-04-05 DIAGNOSIS — L570 Actinic keratosis: Secondary | ICD-10-CM | POA: Diagnosis not present

## 2021-04-05 DIAGNOSIS — D2271 Melanocytic nevi of right lower limb, including hip: Secondary | ICD-10-CM | POA: Diagnosis not present

## 2021-04-05 DIAGNOSIS — D225 Melanocytic nevi of trunk: Secondary | ICD-10-CM | POA: Diagnosis not present

## 2021-04-05 DIAGNOSIS — L821 Other seborrheic keratosis: Secondary | ICD-10-CM | POA: Diagnosis not present

## 2021-04-05 DIAGNOSIS — D485 Neoplasm of uncertain behavior of skin: Secondary | ICD-10-CM | POA: Diagnosis not present

## 2021-04-05 DIAGNOSIS — X32XXXA Exposure to sunlight, initial encounter: Secondary | ICD-10-CM | POA: Diagnosis not present

## 2021-04-06 ENCOUNTER — Other Ambulatory Visit: Payer: Self-pay | Admitting: Physical Medicine & Rehabilitation

## 2021-04-06 DIAGNOSIS — M5442 Lumbago with sciatica, left side: Secondary | ICD-10-CM | POA: Diagnosis not present

## 2021-04-06 DIAGNOSIS — M5441 Lumbago with sciatica, right side: Secondary | ICD-10-CM | POA: Diagnosis not present

## 2021-04-06 DIAGNOSIS — G8929 Other chronic pain: Secondary | ICD-10-CM

## 2021-04-10 ENCOUNTER — Other Ambulatory Visit: Payer: Self-pay

## 2021-04-10 ENCOUNTER — Ambulatory Visit
Admission: RE | Admit: 2021-04-10 | Discharge: 2021-04-10 | Disposition: A | Discharge status: Home / Self Care | Payer: PPO | Source: Ambulatory Visit | Attending: Physical Medicine & Rehabilitation | Admitting: Physical Medicine & Rehabilitation

## 2021-04-10 DIAGNOSIS — M5441 Lumbago with sciatica, right side: Secondary | ICD-10-CM | POA: Insufficient documentation

## 2021-04-10 DIAGNOSIS — G8929 Other chronic pain: Secondary | ICD-10-CM | POA: Insufficient documentation

## 2021-04-10 DIAGNOSIS — M545 Low back pain, unspecified: Secondary | ICD-10-CM | POA: Diagnosis not present

## 2021-04-10 DIAGNOSIS — M5442 Lumbago with sciatica, left side: Secondary | ICD-10-CM | POA: Insufficient documentation

## 2021-04-13 DIAGNOSIS — M48062 Spinal stenosis, lumbar region with neurogenic claudication: Secondary | ICD-10-CM | POA: Diagnosis not present

## 2021-04-13 DIAGNOSIS — M5441 Lumbago with sciatica, right side: Secondary | ICD-10-CM | POA: Diagnosis not present

## 2021-04-13 DIAGNOSIS — G8929 Other chronic pain: Secondary | ICD-10-CM | POA: Diagnosis not present

## 2021-04-13 DIAGNOSIS — M5442 Lumbago with sciatica, left side: Secondary | ICD-10-CM | POA: Diagnosis not present

## 2021-04-23 DIAGNOSIS — J439 Emphysema, unspecified: Secondary | ICD-10-CM | POA: Diagnosis not present

## 2021-04-25 ENCOUNTER — Other Ambulatory Visit: Payer: Self-pay

## 2021-04-25 ENCOUNTER — Inpatient Hospital Stay: Payer: PPO | Attending: Oncology

## 2021-04-25 DIAGNOSIS — D472 Monoclonal gammopathy: Secondary | ICD-10-CM | POA: Diagnosis not present

## 2021-04-25 DIAGNOSIS — Z8572 Personal history of non-Hodgkin lymphomas: Secondary | ICD-10-CM | POA: Diagnosis not present

## 2021-04-25 DIAGNOSIS — Z9221 Personal history of antineoplastic chemotherapy: Secondary | ICD-10-CM | POA: Insufficient documentation

## 2021-04-25 DIAGNOSIS — E038 Other specified hypothyroidism: Secondary | ICD-10-CM | POA: Insufficient documentation

## 2021-04-25 DIAGNOSIS — Z452 Encounter for adjustment and management of vascular access device: Secondary | ICD-10-CM | POA: Insufficient documentation

## 2021-04-25 DIAGNOSIS — Z8546 Personal history of malignant neoplasm of prostate: Secondary | ICD-10-CM | POA: Diagnosis not present

## 2021-04-25 DIAGNOSIS — Z95828 Presence of other vascular implants and grafts: Secondary | ICD-10-CM

## 2021-04-25 DIAGNOSIS — Z923 Personal history of irradiation: Secondary | ICD-10-CM | POA: Insufficient documentation

## 2021-04-25 MED ORDER — SODIUM CHLORIDE 0.9% FLUSH
10.0000 mL | Freq: Once | INTRAVENOUS | Status: AC
  Administered 2021-04-25: 14:00:00 10 mL via INTRAVENOUS
  Filled 2021-04-25: qty 10

## 2021-04-25 MED ORDER — HEPARIN SOD (PORK) LOCK FLUSH 100 UNIT/ML IV SOLN
500.0000 [IU] | Freq: Once | INTRAVENOUS | Status: AC
  Administered 2021-04-25: 14:00:00 500 [IU] via INTRAVENOUS
  Filled 2021-04-25: qty 5

## 2021-04-28 DIAGNOSIS — M48062 Spinal stenosis, lumbar region with neurogenic claudication: Secondary | ICD-10-CM | POA: Diagnosis not present

## 2021-05-11 DIAGNOSIS — M48062 Spinal stenosis, lumbar region with neurogenic claudication: Secondary | ICD-10-CM | POA: Diagnosis not present

## 2021-05-11 DIAGNOSIS — G8929 Other chronic pain: Secondary | ICD-10-CM | POA: Diagnosis not present

## 2021-05-11 DIAGNOSIS — M5442 Lumbago with sciatica, left side: Secondary | ICD-10-CM | POA: Diagnosis not present

## 2021-05-11 DIAGNOSIS — M5441 Lumbago with sciatica, right side: Secondary | ICD-10-CM | POA: Diagnosis not present

## 2021-05-12 DIAGNOSIS — N1832 Chronic kidney disease, stage 3b: Secondary | ICD-10-CM | POA: Diagnosis not present

## 2021-05-12 DIAGNOSIS — J449 Chronic obstructive pulmonary disease, unspecified: Secondary | ICD-10-CM | POA: Diagnosis not present

## 2021-05-23 DIAGNOSIS — J439 Emphysema, unspecified: Secondary | ICD-10-CM | POA: Diagnosis not present

## 2021-06-01 DIAGNOSIS — D225 Melanocytic nevi of trunk: Secondary | ICD-10-CM | POA: Diagnosis not present

## 2021-06-01 DIAGNOSIS — L905 Scar conditions and fibrosis of skin: Secondary | ICD-10-CM | POA: Diagnosis not present

## 2021-06-21 ENCOUNTER — Inpatient Hospital Stay: Payer: PPO | Attending: Oncology

## 2021-07-25 DIAGNOSIS — R0602 Shortness of breath: Secondary | ICD-10-CM | POA: Diagnosis not present

## 2021-07-25 DIAGNOSIS — G4700 Insomnia, unspecified: Secondary | ICD-10-CM | POA: Diagnosis not present

## 2021-07-25 DIAGNOSIS — E2749 Other adrenocortical insufficiency: Secondary | ICD-10-CM | POA: Diagnosis not present

## 2021-07-25 DIAGNOSIS — E039 Hypothyroidism, unspecified: Secondary | ICD-10-CM | POA: Diagnosis not present

## 2021-07-29 DIAGNOSIS — E039 Hypothyroidism, unspecified: Secondary | ICD-10-CM | POA: Diagnosis not present

## 2021-07-29 DIAGNOSIS — E2749 Other adrenocortical insufficiency: Secondary | ICD-10-CM | POA: Diagnosis not present

## 2021-08-11 ENCOUNTER — Other Ambulatory Visit: Payer: Self-pay | Admitting: *Deleted

## 2021-08-11 DIAGNOSIS — C3492 Malignant neoplasm of unspecified part of left bronchus or lung: Secondary | ICD-10-CM

## 2021-08-11 DIAGNOSIS — J449 Chronic obstructive pulmonary disease, unspecified: Secondary | ICD-10-CM | POA: Diagnosis not present

## 2021-08-11 DIAGNOSIS — Z85118 Personal history of other malignant neoplasm of bronchus and lung: Secondary | ICD-10-CM | POA: Diagnosis not present

## 2021-08-11 DIAGNOSIS — N1832 Chronic kidney disease, stage 3b: Secondary | ICD-10-CM | POA: Diagnosis not present

## 2021-08-16 ENCOUNTER — Other Ambulatory Visit: Payer: Self-pay

## 2021-08-16 ENCOUNTER — Inpatient Hospital Stay: Payer: PPO | Attending: Oncology

## 2021-08-16 DIAGNOSIS — C3492 Malignant neoplasm of unspecified part of left bronchus or lung: Secondary | ICD-10-CM

## 2021-08-16 DIAGNOSIS — Z85118 Personal history of other malignant neoplasm of bronchus and lung: Secondary | ICD-10-CM | POA: Insufficient documentation

## 2021-08-16 DIAGNOSIS — D472 Monoclonal gammopathy: Secondary | ICD-10-CM | POA: Diagnosis not present

## 2021-08-16 DIAGNOSIS — Z95828 Presence of other vascular implants and grafts: Secondary | ICD-10-CM

## 2021-08-16 DIAGNOSIS — E039 Hypothyroidism, unspecified: Secondary | ICD-10-CM | POA: Diagnosis not present

## 2021-08-16 DIAGNOSIS — E2740 Unspecified adrenocortical insufficiency: Secondary | ICD-10-CM | POA: Insufficient documentation

## 2021-08-16 LAB — COMPREHENSIVE METABOLIC PANEL
ALT: 18 U/L (ref 0–44)
AST: 21 U/L (ref 15–41)
Albumin: 3.8 g/dL (ref 3.5–5.0)
Alkaline Phosphatase: 41 U/L (ref 38–126)
Anion gap: 9 (ref 5–15)
BUN: 27 mg/dL — ABNORMAL HIGH (ref 8–23)
CO2: 25 mmol/L (ref 22–32)
Calcium: 8.8 mg/dL — ABNORMAL LOW (ref 8.9–10.3)
Chloride: 101 mmol/L (ref 98–111)
Creatinine, Ser: 1.5 mg/dL — ABNORMAL HIGH (ref 0.61–1.24)
GFR, Estimated: 49 mL/min — ABNORMAL LOW (ref >60)
Glucose, Bld: 111 mg/dL — ABNORMAL HIGH (ref 70–99)
Potassium: 3.9 mmol/L (ref 3.5–5.1)
Sodium: 135 mmol/L (ref 135–145)
Total Bilirubin: 0.9 mg/dL (ref 0.3–1.2)
Total Protein: 6.9 g/dL (ref 6.5–8.1)

## 2021-08-16 LAB — CBC WITH DIFFERENTIAL/PLATELET
Abs Immature Granulocytes: 0.09 10*3/uL — ABNORMAL HIGH (ref 0.00–0.07)
Basophils Absolute: 0.1 10*3/uL (ref 0.0–0.1)
Basophils Relative: 0 %
Eosinophils Absolute: 0 10*3/uL (ref 0.0–0.5)
Eosinophils Relative: 0 %
HCT: 41.8 % (ref 39.0–52.0)
Hemoglobin: 13.5 g/dL (ref 13.0–17.0)
Immature Granulocytes: 1 %
Lymphocytes Relative: 6 %
Lymphs Abs: 0.7 10*3/uL (ref 0.7–4.0)
MCH: 30.8 pg (ref 26.0–34.0)
MCHC: 32.3 g/dL (ref 30.0–36.0)
MCV: 95.4 fL (ref 80.0–100.0)
Monocytes Absolute: 0.9 10*3/uL (ref 0.1–1.0)
Monocytes Relative: 8 %
Neutro Abs: 9.6 10*3/uL — ABNORMAL HIGH (ref 1.7–7.7)
Neutrophils Relative %: 85 %
Platelets: 149 10*3/uL — ABNORMAL LOW (ref 150–400)
RBC: 4.38 MIL/uL (ref 4.22–5.81)
RDW: 15.5 % (ref 11.5–15.5)
WBC: 11.4 10*3/uL — ABNORMAL HIGH (ref 4.0–10.5)
nRBC: 0 % (ref 0.0–0.2)

## 2021-08-16 MED ORDER — HEPARIN SOD (PORK) LOCK FLUSH 100 UNIT/ML IV SOLN
500.0000 [IU] | Freq: Once | INTRAVENOUS | Status: AC
  Administered 2021-08-16: 500 [IU] via INTRAVENOUS
  Filled 2021-08-16: qty 5

## 2021-08-16 MED ORDER — SODIUM CHLORIDE 0.9% FLUSH
10.0000 mL | Freq: Once | INTRAVENOUS | Status: AC
  Administered 2021-08-16: 10 mL via INTRAVENOUS
  Filled 2021-08-16: qty 10

## 2021-08-17 DIAGNOSIS — L089 Local infection of the skin and subcutaneous tissue, unspecified: Secondary | ICD-10-CM | POA: Diagnosis not present

## 2021-08-17 DIAGNOSIS — L723 Sebaceous cyst: Secondary | ICD-10-CM | POA: Diagnosis not present

## 2021-08-17 LAB — KAPPA/LAMBDA LIGHT CHAINS
Kappa free light chain: 46.1 mg/L — ABNORMAL HIGH (ref 3.3–19.4)
Kappa, lambda light chain ratio: 1.37 (ref 0.26–1.65)
Lambda free light chains: 33.7 mg/L — ABNORMAL HIGH (ref 5.7–26.3)

## 2021-08-19 LAB — MULTIPLE MYELOMA PANEL, SERUM
Albumin SerPl Elph-Mcnc: 3.5 g/dL (ref 2.9–4.4)
Albumin/Glob SerPl: 1.4 (ref 0.7–1.7)
Alpha 1: 0.2 g/dL (ref 0.0–0.4)
Alpha2 Glob SerPl Elph-Mcnc: 0.7 g/dL (ref 0.4–1.0)
B-Globulin SerPl Elph-Mcnc: 0.9 g/dL (ref 0.7–1.3)
Gamma Glob SerPl Elph-Mcnc: 0.7 g/dL (ref 0.4–1.8)
Globulin, Total: 2.6 g/dL (ref 2.2–3.9)
IgA: 217 mg/dL (ref 61–437)
IgG (Immunoglobin G), Serum: 744 mg/dL (ref 603–1613)
IgM (Immunoglobulin M), Srm: 67 mg/dL (ref 15–143)
M Protein SerPl Elph-Mcnc: 0.2 g/dL — ABNORMAL HIGH
Total Protein ELP: 6.1 g/dL (ref 6.0–8.5)

## 2021-08-29 DIAGNOSIS — Z961 Presence of intraocular lens: Secondary | ICD-10-CM | POA: Diagnosis not present

## 2021-09-06 ENCOUNTER — Ambulatory Visit
Admission: RE | Admit: 2021-09-06 | Discharge: 2021-09-06 | Disposition: A | Discharge status: Home / Self Care | Payer: PPO | Source: Ambulatory Visit | Attending: Oncology | Admitting: Oncology

## 2021-09-06 ENCOUNTER — Other Ambulatory Visit: Payer: Self-pay

## 2021-09-06 DIAGNOSIS — I2510 Atherosclerotic heart disease of native coronary artery without angina pectoris: Secondary | ICD-10-CM | POA: Diagnosis not present

## 2021-09-06 DIAGNOSIS — C3490 Malignant neoplasm of unspecified part of unspecified bronchus or lung: Secondary | ICD-10-CM | POA: Diagnosis not present

## 2021-09-06 DIAGNOSIS — C3492 Malignant neoplasm of unspecified part of left bronchus or lung: Secondary | ICD-10-CM | POA: Diagnosis not present

## 2021-09-06 DIAGNOSIS — N2881 Hypertrophy of kidney: Secondary | ICD-10-CM | POA: Diagnosis not present

## 2021-09-06 DIAGNOSIS — I700 Atherosclerosis of aorta: Secondary | ICD-10-CM | POA: Diagnosis not present

## 2021-09-06 DIAGNOSIS — N261 Atrophy of kidney (terminal): Secondary | ICD-10-CM | POA: Diagnosis not present

## 2021-09-06 DIAGNOSIS — N281 Cyst of kidney, acquired: Secondary | ICD-10-CM | POA: Diagnosis not present

## 2021-09-06 DIAGNOSIS — J439 Emphysema, unspecified: Secondary | ICD-10-CM | POA: Diagnosis not present

## 2021-09-07 ENCOUNTER — Ambulatory Visit: Payer: PPO | Admitting: Radiation Oncology

## 2021-09-14 ENCOUNTER — Other Ambulatory Visit: Payer: PPO

## 2021-09-19 ENCOUNTER — Other Ambulatory Visit: Payer: Self-pay

## 2021-09-19 DIAGNOSIS — C3492 Malignant neoplasm of unspecified part of left bronchus or lung: Secondary | ICD-10-CM

## 2021-09-20 ENCOUNTER — Ambulatory Visit
Admission: RE | Admit: 2021-09-20 | Discharge: 2021-09-20 | Disposition: A | Discharge status: Home / Self Care | Payer: PPO | Source: Ambulatory Visit | Attending: Radiation Oncology | Admitting: Radiation Oncology

## 2021-09-20 ENCOUNTER — Inpatient Hospital Stay: Payer: PPO | Attending: Oncology | Admitting: Oncology

## 2021-09-20 ENCOUNTER — Encounter: Payer: Self-pay | Admitting: Oncology

## 2021-09-20 ENCOUNTER — Encounter: Payer: Self-pay | Admitting: Radiation Oncology

## 2021-09-20 ENCOUNTER — Inpatient Hospital Stay: Payer: PPO

## 2021-09-20 ENCOUNTER — Other Ambulatory Visit: Payer: PPO

## 2021-09-20 VITALS — BP 106/83 | Wt 244.0 lb

## 2021-09-20 VITALS — BP 106/83 | HR 83 | Temp 98.4°F | Resp 20 | Wt 244.3 lb

## 2021-09-20 DIAGNOSIS — M545 Low back pain, unspecified: Secondary | ICD-10-CM | POA: Insufficient documentation

## 2021-09-20 DIAGNOSIS — E039 Hypothyroidism, unspecified: Secondary | ICD-10-CM | POA: Insufficient documentation

## 2021-09-20 DIAGNOSIS — Z803 Family history of malignant neoplasm of breast: Secondary | ICD-10-CM | POA: Insufficient documentation

## 2021-09-20 DIAGNOSIS — N1832 Chronic kidney disease, stage 3b: Secondary | ICD-10-CM | POA: Diagnosis not present

## 2021-09-20 DIAGNOSIS — Z87891 Personal history of nicotine dependence: Secondary | ICD-10-CM | POA: Diagnosis not present

## 2021-09-20 DIAGNOSIS — N189 Chronic kidney disease, unspecified: Secondary | ICD-10-CM | POA: Diagnosis not present

## 2021-09-20 DIAGNOSIS — C3412 Malignant neoplasm of upper lobe, left bronchus or lung: Secondary | ICD-10-CM | POA: Diagnosis not present

## 2021-09-20 DIAGNOSIS — Z08 Encounter for follow-up examination after completed treatment for malignant neoplasm: Secondary | ICD-10-CM | POA: Diagnosis not present

## 2021-09-20 DIAGNOSIS — E2740 Unspecified adrenocortical insufficiency: Secondary | ICD-10-CM | POA: Diagnosis not present

## 2021-09-20 DIAGNOSIS — E032 Hypothyroidism due to medicaments and other exogenous substances: Secondary | ICD-10-CM | POA: Diagnosis not present

## 2021-09-20 DIAGNOSIS — R918 Other nonspecific abnormal finding of lung field: Secondary | ICD-10-CM | POA: Diagnosis not present

## 2021-09-20 DIAGNOSIS — T50905A Adverse effect of unspecified drugs, medicaments and biological substances, initial encounter: Secondary | ICD-10-CM | POA: Diagnosis not present

## 2021-09-20 DIAGNOSIS — Z923 Personal history of irradiation: Secondary | ICD-10-CM | POA: Diagnosis not present

## 2021-09-20 DIAGNOSIS — M48061 Spinal stenosis, lumbar region without neurogenic claudication: Secondary | ICD-10-CM | POA: Insufficient documentation

## 2021-09-20 DIAGNOSIS — T380X5A Adverse effect of glucocorticoids and synthetic analogues, initial encounter: Secondary | ICD-10-CM | POA: Diagnosis not present

## 2021-09-20 DIAGNOSIS — R635 Abnormal weight gain: Secondary | ICD-10-CM

## 2021-09-20 DIAGNOSIS — Z7952 Long term (current) use of systemic steroids: Secondary | ICD-10-CM | POA: Insufficient documentation

## 2021-09-20 DIAGNOSIS — D472 Monoclonal gammopathy: Secondary | ICD-10-CM | POA: Diagnosis not present

## 2021-09-20 DIAGNOSIS — Z8546 Personal history of malignant neoplasm of prostate: Secondary | ICD-10-CM | POA: Insufficient documentation

## 2021-09-20 DIAGNOSIS — M8580 Other specified disorders of bone density and structure, unspecified site: Secondary | ICD-10-CM | POA: Diagnosis not present

## 2021-09-20 DIAGNOSIS — E669 Obesity, unspecified: Secondary | ICD-10-CM | POA: Diagnosis not present

## 2021-09-20 DIAGNOSIS — I129 Hypertensive chronic kidney disease with stage 1 through stage 4 chronic kidney disease, or unspecified chronic kidney disease: Secondary | ICD-10-CM | POA: Diagnosis not present

## 2021-09-20 DIAGNOSIS — Z95828 Presence of other vascular implants and grafts: Secondary | ICD-10-CM

## 2021-09-20 DIAGNOSIS — Z9221 Personal history of antineoplastic chemotherapy: Secondary | ICD-10-CM | POA: Insufficient documentation

## 2021-09-20 DIAGNOSIS — C61 Malignant neoplasm of prostate: Secondary | ICD-10-CM | POA: Diagnosis not present

## 2021-09-20 DIAGNOSIS — C3492 Malignant neoplasm of unspecified part of left bronchus or lung: Secondary | ICD-10-CM

## 2021-09-20 LAB — CBC WITH DIFFERENTIAL/PLATELET
Abs Immature Granulocytes: 0.04 10*3/uL (ref 0.00–0.07)
Basophils Absolute: 0 10*3/uL (ref 0.0–0.1)
Basophils Relative: 0 %
Eosinophils Absolute: 0.2 10*3/uL (ref 0.0–0.5)
Eosinophils Relative: 2 %
HCT: 41.5 % (ref 39.0–52.0)
Hemoglobin: 13.4 g/dL (ref 13.0–17.0)
Immature Granulocytes: 1 %
Lymphocytes Relative: 10 %
Lymphs Abs: 0.8 10*3/uL (ref 0.7–4.0)
MCH: 30.6 pg (ref 26.0–34.0)
MCHC: 32.3 g/dL (ref 30.0–36.0)
MCV: 94.7 fL (ref 80.0–100.0)
Monocytes Absolute: 0.9 10*3/uL (ref 0.1–1.0)
Monocytes Relative: 11 %
Neutro Abs: 6.1 10*3/uL (ref 1.7–7.7)
Neutrophils Relative %: 76 %
Platelets: 140 10*3/uL — ABNORMAL LOW (ref 150–400)
RBC: 4.38 MIL/uL (ref 4.22–5.81)
RDW: 15.4 % (ref 11.5–15.5)
WBC: 8.1 10*3/uL (ref 4.0–10.5)
nRBC: 0 % (ref 0.0–0.2)

## 2021-09-20 LAB — COMPREHENSIVE METABOLIC PANEL
ALT: 21 U/L (ref 0–44)
AST: 22 U/L (ref 15–41)
Albumin: 3.6 g/dL (ref 3.5–5.0)
Alkaline Phosphatase: 38 U/L (ref 38–126)
Anion gap: 5 (ref 5–15)
BUN: 26 mg/dL — ABNORMAL HIGH (ref 8–23)
CO2: 25 mmol/L (ref 22–32)
Calcium: 8.4 mg/dL — ABNORMAL LOW (ref 8.9–10.3)
Chloride: 107 mmol/L (ref 98–111)
Creatinine, Ser: 1.34 mg/dL — ABNORMAL HIGH (ref 0.61–1.24)
GFR, Estimated: 56 mL/min — ABNORMAL LOW (ref >60)
Glucose, Bld: 108 mg/dL — ABNORMAL HIGH (ref 70–99)
Potassium: 3.4 mmol/L — ABNORMAL LOW (ref 3.5–5.1)
Sodium: 137 mmol/L (ref 135–145)
Total Bilirubin: 0.4 mg/dL (ref 0.3–1.2)
Total Protein: 6.4 g/dL — ABNORMAL LOW (ref 6.5–8.1)

## 2021-09-20 MED ORDER — HEPARIN SOD (PORK) LOCK FLUSH 100 UNIT/ML IV SOLN
500.0000 [IU] | Freq: Once | INTRAVENOUS | Status: AC
  Administered 2021-09-20: 500 [IU] via INTRAVENOUS
  Filled 2021-09-20: qty 5

## 2021-09-20 MED ORDER — SODIUM CHLORIDE 0.9% FLUSH
10.0000 mL | INTRAVENOUS | Status: DC | PRN
  Administered 2021-09-20: 10 mL via INTRAVENOUS
  Filled 2021-09-20: qty 10

## 2021-09-20 NOTE — Progress Notes (Signed)
?Hematology/Oncology Follow Up Note ?Marc Gray ?[phone] [phone] ? ? ?Patient Care Team: ?[name], MD as PCP - General (Family Medicine) ?Robert Bellow, MD (General Surgery) ?[name], MD as Consulting Physician (Oncology) ?[name], RN as Equities trader ? ?Reason for Visit:  ?Follow up for lung cancer, immunotherapy side effects, adrenal insufficiency. ? ?HISTORY OF PRESENTING ILLNESS:  ?Marc Gray 76 y.o.  male who presents for treatment of Stage IIIB (cT2b, cN3, cM0)  Adenocarcinoma of lung.  ?# Patient is s/p [place] and taxol concurrent RT for treatment of Stage IIIB Lung cancer. He recieved additional lung boost RT finished one year of Durvalumab -January 2020 ? ?# Colon CA was listed in his history, however reviewing his surgical pathology from colonoscopy on 04/15/2014, during which he had polyps removed and all are negative for cancer.patient is scheduled for colonoscopy screening this year. ? ?# 5/1/ 2019 CT scan was discussed at tumor board and changes consistent with radiation changes ? ?# TURP procedure done on 02/14/2019.  Pathology showed prostate adenocarcinoma, Gleason score 3+3, tumor quantitation, estimated percentage of prostate tissue involved by tumor less than 5%. ?No perineural invasion identified. ?Patient follows up with urologist and was recommended to observe. ? ?03/07/2021, CT without contrast showed unchanged posttreatment/postradiation fibrosis and consolidation of the perihilar left lung and a paramedian left upper lobe.  Unchanged 4 mm nodules of the right lower lobe.  Almost certainly benign and incidental.  Emphysema.  Coronary artery disease. ? ?INTERVAL HISTORY ?Patient he presents for follow-up of fo stage IIIB adenocarcinoma of the lung. ?Patient follows up with endocrinology for adrenal insufficiency and hypothyroidism.  Patient currently on prednisone 10 mg in a.m., 2.5 mg in p.m.  He tries to  increase exercise, healthy diet.  He continues to gain weight.  He was accompanied by his wife ?He has chronic back pain which is exacerbated with exertion.  Arthralgia. ? ? ?Review of Systems  ?Constitutional:  Negative for appetite change, chills, fatigue, fever and unexpected weight change.  ?HENT:   Negative for hearing loss and voice change.   ?Eyes:  Negative for eye problems and icterus.  ?Respiratory:  Negative for chest tightness, cough and shortness of breath.   ?Cardiovascular:  Negative for chest pain and leg swelling.  ?Gastrointestinal:  Negative for abdominal distention, abdominal pain and blood in stool.  ?Endocrine: Negative for hot flashes.  ?Genitourinary:  Negative for difficulty urinating, dysuria and frequency.   ?Musculoskeletal:  Positive for arthralgias and back pain.  ?Skin:  Negative for itching and rash.  ?Neurological:  Negative for extremity weakness, light-headedness and numbness.  ?Hematological:  Negative for adenopathy. Does not bruise/bleed easily.  ?Psychiatric/Behavioral:  Negative for confusion.    ?MEDICAL HISTORY:  ?Past Medical History:  ?Diagnosis Date  ? Anxiety   ? Aortic atherosclerosis ([place])   ? Arthritis   ? SHOULDER  ? BPH (benign prostatic hyperplasia)   ? Cataract   ? Chronic kidney disease   ? low kidney function  ? Colon adenomas   ? Colon cancer ([place]) 2015  ? Pt states during his colonoscopy he had cancerous polyps removed.   ? COPD (chronic obstructive pulmonary disease) ([place])   ? Depression   ? SINCE DIAGNOSIS  ? Dyspnea   ? DOE  ? Enlarged prostate   ? GERD (gastroesophageal reflux disease)   ? Hypertension   ? Hypothyroidism   ? Lung cancer ([place])   ? Aug 2018  ? Pain   ?  DUE TO LUNG ISSUE  ? ? ?SURGICAL HISTORY: ?Past Surgical History:  ?Procedure Laterality Date  ? BACK SURGERY    ? kyphoplasty  T5-6  ? CATARACT EXTRACTION W/ INTRAOCULAR LENS  IMPLANT, BILATERAL    ? COLONOSCOPY    ? COLONOSCOPY WITH PROPOFOL N/A 12/17/2019  ? Procedure: COLONOSCOPY WITH  PROPOFOL;  Surgeon: Robert Bellow, MD;  Location: [facility] ENDOSCOPY;  Service: Endoscopy;  Laterality: N/A;  ? EYE SURGERY Bilateral   ? cataract  ? LYMPH GLAND EXCISION N/A 02/09/2017  ? Procedure: CERVICAL LYMPH NODE BIOPSY;  Surgeon: Robert Bellow, MD;  Location: [facility] ORS;  Service: General;  Laterality: N/A;  ? PORTACATH PLACEMENT Right 02/23/2017  ? Procedure: INSERTION PORT-A-CATH;  Surgeon: Robert Bellow, MD;  Location: [facility] ORS;  Service: General;  Laterality: Right;  ? PROSTATE SURGERY    ? TONSILLECTOMY    ? TRANSURETHRAL RESECTION OF PROSTATE N/A 02/14/2019  ? Procedure: TRANSURETHRAL RESECTION OF THE PROSTATE (TURP);  Surgeon: [name], MD;  Location: [facility] ORS;  Service: Urology;  Laterality: N/A;  ? ? ?SOCIAL HISTORY: ?Social History  ? ?Socioeconomic History  ? Marital status: Married  ?  Spouse name: Not on file  ? Number of children: Not on file  ? Years of education: Not on file  ? Highest education level: Not on file  ?Occupational History  ? Not on file  ?Tobacco Use  ? Smoking status: Former  ?  Packs/day: 1.00  ?  Years: 53.00  ?  Pack years: 53.00  ?  Types: Cigarettes  ?  Quit date: 04/14/2017  ?  Years since quitting: 4.4  ? Smokeless tobacco: Never  ? Tobacco comments:  ?  Quit November 2018  ?Vaping Use  ? Vaping Use: Never used  ?Substance and Sexual Activity  ? Alcohol use: Not Currently  ?  Comment: occassional  ? Drug use: No  ? Sexual activity: Yes  ?Other Topics Concern  ? Not on file  ?Social History Narrative  ? Not on file  ? ?Social Determinants of Health  ? ?Financial Resource Strain: Not on file  ?Food Insecurity: Not on file  ?Transportation Needs: Not on file  ?Physical Activity: Not on file  ?Stress: Not on file  ?Social Connections: Not on file  ?Intimate Partner Violence: Not on file  ? ? ?FAMILY HISTORY: ?Family History  ?Problem Relation Age of Onset  ? Breast cancer Maternal Grandmother   ? Dementia Mother   ? Diabetes Father   ? Stroke Father    ? ? ?ALLERGIES:  is allergic to morphine and related. ? ?MEDICATIONS:  ?Current Outpatient Medications  ?Medication Sig Dispense Refill  ? acetaminophen (TYLENOL) 500 MG tablet Take 1,000 mg by mouth every 6 (six) hours as needed for moderate pain.    ? aspirin EC 81 MG tablet Take 1 tablet (81 mg total) by mouth daily. Swallow whole. 90 tablet 0  ? Azelastine HCl 137 MCG/SPRAY SOLN SMARTSIG:1-2 Spray(s) Both Nares Twice Daily    ? BREZTRI AEROSPHERE 160-9-4.8 MCG/ACT AERO Inhale 2 puffs into the lungs 2 (two) times daily.    ? budesonide (PULMICORT) 0.5 MG/2ML nebulizer solution Take by nebulization 2 (two) times daily.    ? budesonide-formoterol (SYMBICORT) 160-4.5 MCG/ACT inhaler Inhale 2 puffs into the lungs 2 (two) times daily. Must contact PCP for future refills 1 Inhaler 0  ? calcium-vitamin D (OSCAL WITH D) 500-200 MG-UNIT tablet Take 1 tablet by mouth daily. (Patient taking differently: Take 2  tablets by mouth daily.) 90 tablet 1  ? Chlorpheniramine-Phenylephrine 4-10 MG tablet Take 1 tablet by mouth every 6 (six) hours as needed for allergies.    ? diphenhydrAMINE-zinc acetate (BENADRYL EXTRA STRENGTH) cream Apply 1 application topically 3 (three) times daily as needed for itching. 28 g 0  ? fluticasone (FLONASE) 50 MCG/ACT nasal spray Place 1 spray into both nostrils daily. 16 g 2  ? furosemide (LASIX) 20 MG tablet Take by mouth.    ? Ipratropium-Albuterol (COMBIVENT) 20-100 MCG/ACT AERS respimat Inhale into the lungs.    ? ipratropium-albuterol (DUONEB) 0.5-2.5 (3) MG/3ML SOLN Inhale into the lungs.    ? levothyroxine (SYNTHROID) 175 MCG tablet     ? lidocaine-prilocaine (EMLA) cream Apply over port 1-2 hours prior to chemotherapy treatment.  Cover with plastic wrap. 30 g 1  ? Melatonin 10 MG TABS Take 10 mg by mouth at bedtime.     ? Multiple Vitamin (MULTI-VITAMINS) TABS Take 1 tablet by mouth daily.     ? ondansetron (ZOFRAN) 8 MG tablet Take 1 tablet (8 mg total) by mouth every 12 (twelve) hours as  needed for nausea or vomiting. 60 tablet 0  ? pantoprazole (PROTONIX) 40 MG tablet Take 1 tablet (40 mg total) by mouth daily. 90 tablet 0  ? predniSONE (DELTASONE) 5 MG tablet Take by mouth.    ? prochlorperazine

## 2021-09-20 NOTE — Progress Notes (Signed)
Radiation Oncology ?Follow up Note ? ?Name: Marc Gray   ?Date:   09/20/2021 ?MRN:  [id number] ?DOB: 01-16-1946  ? ? ?This 76 y.o. male presents to the clinic today for 4-year follow-up status post concurrent chemoradiation therapy for stage IIIb adenocarcinoma of the lung. ? ?REFERRING PROVIDER: [name], MD ? 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  Specifically denies cough hemoptysis or chest tightness..  Recent CT scan this month showed changes involving the left hilum and left paramediastinal lung no findings suspicious for recurrent or progressive disease.  He has a stable 3 mm right lower lobe pulmonary nodule and no other new pulmonary nodules.  He had an MRI of his spine back in November showing lumbar scoliosis with some spinal stenosis.  No evidence of metastatic disease. ? ?COMPLICATIONS OF TREATMENT: none ? ?FOLLOW UP COMPLIANCE: keeps appointments  ? ?PHYSICAL EXAM:  ?BP 106/83 (BP Location: Right Arm, Patient Position: Sitting, Cuff Size: Large)   Pulse 83   Temp 98.4 ?F (36.9 ?C) (Tympanic)   Resp 20   Wt 244 lb 4.8 oz (110.8 kg)   BMI 32.23 kg/m?  ?Well-developed well-nourished patient in NAD. HEENT reveals PERLA, EOMI, discs not visualized.  Oral cavity is clear. No oral mucosal lesions are identified. Neck is clear without evidence of cervical or supraclavicular adenopathy. Lungs are clear to A&P. Cardiac examination is essentially unremarkable with regular rate and rhythm without murmur rub or thrill. Abdomen is benign with no organomegaly or masses noted. Motor sensory and DTR levels are equal and symmetric in the upper and lower extremities. Cranial nerves II through XII are grossly intact. Proprioception is intact. No peripheral adenopathy or  edema is identified. No motor or sensory levels are noted. Crude visual fields are within normal range. ? ?RADIOLOGY RESULTS: CT scans and MRI scans reviewed compatible with above-stated findings ? ?PLAN: Patient is now out over 4 years.  I Georgina Peer turn follow-up care over to medical oncology.  To be happy to reevaluate the patient anytime in the future should that be indicated.  Patient knows to call with any concerns. ? ?I would like to take this opportunity to thank you for allowing me to participate in the care of your patient.. ?  ? Noreene Filbert, MD ? ?

## 2021-10-06 DIAGNOSIS — J449 Chronic obstructive pulmonary disease, unspecified: Secondary | ICD-10-CM | POA: Diagnosis not present

## 2021-10-13 ENCOUNTER — Ambulatory Visit: Payer: Self-pay | Admitting: Urology

## 2021-10-20 ENCOUNTER — Other Ambulatory Visit: Payer: Self-pay

## 2021-10-20 DIAGNOSIS — C61 Malignant neoplasm of prostate: Secondary | ICD-10-CM

## 2021-10-21 ENCOUNTER — Other Ambulatory Visit: Payer: PPO

## 2021-10-21 DIAGNOSIS — C61 Malignant neoplasm of prostate: Secondary | ICD-10-CM

## 2021-11-03 ENCOUNTER — Ambulatory Visit: Payer: PPO | Admitting: Urology

## 2021-11-03 ENCOUNTER — Encounter: Payer: Self-pay | Admitting: Urology

## 2021-11-03 VITALS — BP 147/91 | HR 80 | Ht 73.0 in | Wt 235.0 lb

## 2021-11-03 DIAGNOSIS — N401 Enlarged prostate with lower urinary tract symptoms: Secondary | ICD-10-CM

## 2021-11-03 DIAGNOSIS — Z8546 Personal history of malignant neoplasm of prostate: Secondary | ICD-10-CM

## 2021-11-03 DIAGNOSIS — N528 Other male erectile dysfunction: Secondary | ICD-10-CM | POA: Diagnosis not present

## 2021-11-03 DIAGNOSIS — C61 Malignant neoplasm of prostate: Secondary | ICD-10-CM

## 2021-11-03 DIAGNOSIS — R3914 Feeling of incomplete bladder emptying: Secondary | ICD-10-CM

## 2021-11-03 LAB — BLADDER SCAN AMB NON-IMAGING: Scan Result: 33

## 2021-11-03 MED ORDER — TADALAFIL 5 MG PO TABS: ORAL_TABLET | ORAL | 11 refills | Status: DC

## 2021-11-03 NOTE — Progress Notes (Signed)
   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 back in urology clinic for follow-up of BPH and low risk prostate cancer.  He is a 76 year old male with lung cancer with history of urinary retention and bilateral hydroureteronephrosis in 2013 treated with a TURP by Dr. Jacqlyn Larsen.  I saw him in the fall 2020 for urinary symptoms and he was noted to have significantly elevated PVRs greater than 700 mL.  Cystoscopy showed some residual obstructive anterior prostate tissue and a massively distended bladder, and urodynamics showed high pressure voiding with bladder outlet obstruction.  He has had a known atrophic left kidney on recent renal ultrasound. He underwent a TURP with me on 02/14/2019, and tissue showed <5% involvement of low risk prostate cancer with 3+3=6 disease.  PSA postop has been stable and low, most recently 0.72 in June 2022.  PSA is scheduled for the end of July with oncology, and we will call with those results.   Since surgery, has been doing very well.  He denies any urinary complaints today, no incontinence or gross hematuria.  Bladder scan was originally 500 mL, but he was able to void again with a normal PVR of 33 mL.  We discussed the importance of timed voiding.   I personally reviewed and interpreted the recent CT chest abdomen pelvis from 09/06/2021, this shows stable atrophic left kidney, and no hydronephrosis, wide open TURP defect.  Renal function is stable to improved with creatinine of 1.34, EGFR 56.  He continues to take Cialis 5 mg on demand for ED with good results, this medication was refilled.   Plan to continue active surveillance for his low risk prostate cancer incidentally found at time of TURP with yearly PSA.  We discussed the very low risk of progression or problems from this in the future.  We discussed return precautions at  length including gross hematuria, UTIs, worsening renal function, or incontinence.  Continue yearly PSA, getting BMP routinely with oncology Cialis refilled Continue yearly follow-up for PVR   [name], [name] [address] [place], [place] [address] [phone]

## 2021-11-04 DIAGNOSIS — G4733 Obstructive sleep apnea (adult) (pediatric): Secondary | ICD-10-CM | POA: Diagnosis not present

## 2021-11-07 ENCOUNTER — Other Ambulatory Visit: Payer: Self-pay | Admitting: Nurse Practitioner

## 2021-12-15 DIAGNOSIS — M79605 Pain in left leg: Secondary | ICD-10-CM | POA: Diagnosis not present

## 2021-12-15 DIAGNOSIS — I700 Atherosclerosis of aorta: Secondary | ICD-10-CM | POA: Diagnosis not present

## 2021-12-15 DIAGNOSIS — E785 Hyperlipidemia, unspecified: Secondary | ICD-10-CM | POA: Diagnosis not present

## 2021-12-15 DIAGNOSIS — M545 Low back pain, unspecified: Secondary | ICD-10-CM | POA: Diagnosis not present

## 2021-12-15 DIAGNOSIS — M79604 Pain in right leg: Secondary | ICD-10-CM | POA: Diagnosis not present

## 2021-12-15 DIAGNOSIS — N1832 Chronic kidney disease, stage 3b: Secondary | ICD-10-CM | POA: Diagnosis not present

## 2021-12-16 ENCOUNTER — Telehealth: Payer: Self-pay

## 2021-12-16 NOTE — Telephone Encounter (Signed)
We have received a referral on this patient for low back and bilateral leg pain. Upon review he has had ESIs and a MRI in November 2022, however I don't see any mention about PT. If he hasn't had PT in the past year then please get him scheduled to see Danielle.

## 2021-12-19 DIAGNOSIS — G4733 Obstructive sleep apnea (adult) (pediatric): Secondary | ICD-10-CM | POA: Diagnosis not present

## 2021-12-20 ENCOUNTER — Inpatient Hospital Stay: Payer: PPO | Attending: Oncology

## 2021-12-20 DIAGNOSIS — C3492 Malignant neoplasm of unspecified part of left bronchus or lung: Secondary | ICD-10-CM | POA: Insufficient documentation

## 2021-12-20 DIAGNOSIS — Z87891 Personal history of nicotine dependence: Secondary | ICD-10-CM | POA: Insufficient documentation

## 2021-12-20 DIAGNOSIS — Z452 Encounter for adjustment and management of vascular access device: Secondary | ICD-10-CM | POA: Diagnosis not present

## 2021-12-20 DIAGNOSIS — Z95828 Presence of other vascular implants and grafts: Secondary | ICD-10-CM

## 2021-12-20 MED ORDER — HEPARIN SOD (PORK) LOCK FLUSH 100 UNIT/ML IV SOLN
500.0000 [IU] | Freq: Once | INTRAVENOUS | Status: AC
  Administered 2021-12-20: 500 [IU] via INTRAVENOUS
  Filled 2021-12-20: qty 5

## 2021-12-20 MED ORDER — SODIUM CHLORIDE 0.9% FLUSH
10.0000 mL | INTRAVENOUS | Status: DC | PRN
  Administered 2021-12-20: 10 mL via INTRAVENOUS
  Filled 2021-12-20: qty 10

## 2021-12-20 NOTE — Telephone Encounter (Signed)
He confirmed for 01/19/2022 with Andee Poles.

## 2022-01-06 DIAGNOSIS — Z6836 Body mass index (BMI) 36.0-36.9, adult: Secondary | ICD-10-CM | POA: Diagnosis not present

## 2022-01-06 DIAGNOSIS — D692 Other nonthrombocytopenic purpura: Secondary | ICD-10-CM | POA: Diagnosis not present

## 2022-01-06 DIAGNOSIS — I739 Peripheral vascular disease, unspecified: Secondary | ICD-10-CM | POA: Diagnosis not present

## 2022-01-06 DIAGNOSIS — N1832 Chronic kidney disease, stage 3b: Secondary | ICD-10-CM | POA: Diagnosis not present

## 2022-01-06 DIAGNOSIS — J449 Chronic obstructive pulmonary disease, unspecified: Secondary | ICD-10-CM | POA: Diagnosis not present

## 2022-01-06 DIAGNOSIS — G4733 Obstructive sleep apnea (adult) (pediatric): Secondary | ICD-10-CM | POA: Diagnosis not present

## 2022-01-19 ENCOUNTER — Ambulatory Visit: Payer: PPO | Admitting: Neurosurgery

## 2022-01-19 ENCOUNTER — Encounter: Payer: Self-pay | Admitting: Neurosurgery

## 2022-01-19 VITALS — BP 114/78 | Ht 73.0 in | Wt 244.6 lb

## 2022-01-19 DIAGNOSIS — M47819 Spondylosis without myelopathy or radiculopathy, site unspecified: Secondary | ICD-10-CM | POA: Diagnosis not present

## 2022-01-19 DIAGNOSIS — M545 Low back pain, unspecified: Secondary | ICD-10-CM

## 2022-01-19 DIAGNOSIS — G8929 Other chronic pain: Secondary | ICD-10-CM

## 2022-01-19 DIAGNOSIS — G4733 Obstructive sleep apnea (adult) (pediatric): Secondary | ICD-10-CM | POA: Diagnosis not present

## 2022-01-19 NOTE — Progress Notes (Signed)
Referring Physician:  [name], MD [address] [place],  [place] [address]  Primary Physician:  [name], MD  History of Present Illness: 01/19/2022  History of lung CA, HTN, and CKD 3. On chronic prednisone for his adrenal insufficiency.   Mr. Kairos Panetta is here today with a chief complaint of 2 year history of more constant LBP, left > right, with no leg pain. Pain is worse with activity.  Scribes sharp low back pain with getting up from a seated position.  Relief with sitting and resting.  Denies numbness or tingling that radiates into his legs or associated leg pain.  Worked out with trainer previously. No PT. No weakness in his legs. No bowel/bladder dysfunction.   Conservative measures:  Physical therapy: no  Multimodal medical therapy including regular antiinflammatories: prednisone (chronic) Injections: 04/28/21 bilateral L5-S1 TF ESI with 50% improvement per notes (no relief per patient).    Past Surgery: no lumbar surgery  [name] has no symptoms of cervical myelopathy.  The symptoms are causing a significant impact on the patient's life.   Review of Systems:  A 10 point review of systems is negative, except for the pertinent positives and negatives detailed in the HPI.  Past Medical History: Past Medical History:  Diagnosis Date   Anxiety    Aortic atherosclerosis (HCC)    Arthritis    SHOULDER   BPH (benign prostatic hyperplasia)    Cataract    Chronic kidney disease    low kidney function   Colon adenomas    Colon cancer ([facility]) 2015   Pt states during his colonoscopy he had cancerous polyps removed.    COPD (chronic obstructive pulmonary disease) (HCC)    Depression    SINCE DIAGNOSIS   Dyspnea    DOE   Enlarged prostate    GERD (gastroesophageal reflux disease)    Hypertension    Hypothyroidism    Lung cancer ([facility])    Aug 2018   Pain    DUE TO LUNG ISSUE    Past Surgical History: Past  Surgical History:  Procedure Laterality Date   BACK SURGERY     kyphoplasty  T5-6   CATARACT EXTRACTION W/ INTRAOCULAR LENS  IMPLANT, BILATERAL     COLONOSCOPY     COLONOSCOPY WITH PROPOFOL N/A 12/17/2019   Procedure: COLONOSCOPY WITH PROPOFOL;  Surgeon: Robert Bellow, MD;  Location: [facility];  Service: Endoscopy;  Laterality: N/A;   EYE SURGERY Bilateral    cataract   LYMPH GLAND EXCISION N/A 02/09/2017   Procedure: CERVICAL LYMPH NODE BIOPSY;  Surgeon: Robert Bellow, MD;  Location: [facility];  Service: General;  Laterality: N/A;   PORTACATH PLACEMENT Right 02/23/2017   Procedure: INSERTION PORT-A-CATH;  Surgeon: Robert Bellow, MD;  Location: [facility];  Service: General;  Laterality: Right;   PROSTATE SURGERY     TONSILLECTOMY     TRANSURETHRAL RESECTION OF PROSTATE N/A 02/14/2019   Procedure: TRANSURETHRAL RESECTION OF THE PROSTATE (TURP);  Surgeon: [name], MD;  Location: [facility];  Service: Urology;  Laterality: N/A;    Allergies: Allergies as of 01/19/2022 - Review Complete 11/03/2021  Allergen Reaction Noted   Morphine and related  12/16/2019    Medications: Outpatient Encounter Medications as of 01/19/2022  Medication Sig   acetaminophen (TYLENOL) 500 MG tablet Take 1,000 mg by mouth every 6 (six) hours as needed for moderate pain.   aspirin EC 81 MG tablet Take 1  tablet (81 mg total) by mouth daily. Swallow whole.   Azelastine HCl 137 MCG/SPRAY SOLN SMARTSIG:1-2 Spray(s) Both Nares Twice Daily   BREZTRI AEROSPHERE 160-9-4.8 MCG/ACT AERO Inhale 2 puffs into the lungs 2 (two) times daily.   calcium-vitamin D (OSCAL WITH D) 500-200 MG-UNIT tablet Take 1 tablet by mouth daily.   Chlorpheniramine-Phenylephrine 4-10 MG tablet Take 1 tablet by mouth every 6 (six) hours as needed for allergies.   diphenhydrAMINE-zinc acetate (BENADRYL EXTRA STRENGTH) cream Apply 1 application topically 3 (three) times daily as needed for itching.   fluticasone (FLONASE) 50  MCG/ACT nasal spray Place 1 spray into both nostrils daily.   furosemide (LASIX) 20 MG tablet Take by mouth.   Ipratropium-Albuterol (COMBIVENT) 20-100 MCG/ACT AERS respimat Inhale into the lungs.   Ipratropium-Albuterol (COMBIVENT) 20-100 MCG/ACT AERS respimat Inhale into the lungs.   levothyroxine (SYNTHROID) 175 MCG tablet    lidocaine-prilocaine (EMLA) cream Apply over port 1-2 hours prior to chemotherapy treatment.  Cover with plastic wrap.   Melatonin 10 MG TABS Take 10 mg by mouth at bedtime.    montelukast (SINGULAIR) 10 MG tablet Take 10 mg by mouth daily.   Multiple Vitamin (MULTI-VITAMINS) TABS Take 1 tablet by mouth daily.    ondansetron (ZOFRAN) 8 MG tablet Take 1 tablet (8 mg total) by mouth every 12 (twelve) hours as needed for nausea or vomiting.   pantoprazole (PROTONIX) 40 MG tablet Take 1 tablet (40 mg total) by mouth daily.   predniSONE (DELTASONE) 5 MG tablet Take by mouth.   prochlorperazine (COMPAZINE) 10 MG tablet Take 1 tablet (10 mg total) by mouth every 6 (six) hours as needed for nausea or vomiting.   rosuvastatin (CRESTOR) 10 MG tablet    sertraline (ZOLOFT) 25 MG tablet TAKE 1 TABLET(25 MG) BY MOUTH DAILY   tadalafil (CIALIS) 5 MG tablet Take 5-10mg  by mouth as needed prior to intercourse   tamsulosin (FLOMAX) 0.4 MG CAPS capsule Take 1 capsule (0.4 mg total) by mouth daily.   traZODone (DESYREL) 100 MG tablet Take 100 mg by mouth at bedtime.   triamcinolone ointment (KENALOG) 0.5 % Apply 1 application topically 3 (three) times daily as needed.   Facility-Administered Encounter Medications as of 01/19/2022  Medication   sodium chloride flush (NS) 0.9 % injection 10 mL    Social History: Social History   Tobacco Use   Smoking status: Former    Packs/day: 1.00    Years: 53.00    Total pack years: 53.00    Types: Cigarettes    Quit date: 04/14/2017    Years since quitting: 4.7    Passive exposure: Past   Smokeless tobacco: Never   Tobacco comments:     Quit November 2018  Vaping Use   Vaping Use: Never used  Substance Use Topics   Alcohol use: Not Currently    Comment: occassional   Drug use: No    Family Medical History: Family History  Problem Relation Age of Onset   Breast cancer Maternal Grandmother    Dementia Mother    Diabetes Father    Stroke Father     Physical Examination:  Today's Vitals   01/19/22 1055  BP: 114/78  Weight: 110.9 kg  Height: 6\' 1"  (1.854 m)  PainSc: 3   PainLoc: Back   Body mass index is 32.27 kg/m.   General: Patient is well developed, well nourished, calm, collected, and in no apparent distress. Attention to examination is appropriate.  Psychiatric: Patient is non-anxious.  Head:  Pupils equal, round, and reactive to light.  ENT:  Oral mucosa appears well hydrated.  Neck:   Supple.   Respiratory: Patient is breathing without any difficulty.  Extremities: No edema.  Vascular: Palpable dorsal pedal pulses.  Skin:   On exposed skin, there are no abnormal skin lesions.  NEUROLOGICAL:     Awake, alert, oriented to person, place, and time.  Speech is clear and fluent. Fund of knowledge is appropriate.   Cranial Nerves: Pupils equal round and reactive to light.  Facial tone is symmetric.  Facial sensation is symmetric.  ROM of spine: reasonable ROM with no pain. Mild diffuse lower lumbar tenderness.     Strength: Side Biceps Triceps Deltoid Interossei Grip Wrist Ext. Wrist Flex.  R 5 5 5 5 5 5 5   L 5 5 5 5 5 5 5    Side Iliopsoas Quads Hamstring PF DF EHL  R 5 5 5 5 5 5   L 5 5 5 5 5 5    Reflexes are 2+ and symmetric at the biceps, triceps, brachioradialis, patella and achilles.   Hoffman's is absent.  Clonus is not present.  Toes are down-going.  Bilateral upper and lower extremity sensation is intact to light touch.    Gait is normal.   No difficulty with tandem gait.   No evidence of dysmetria noted.  Medical Decision Making  Imaging: Lumbar MRI  04/10/21  IMPRESSION: 1. Mild levoconvex lumbar scoliosis with grade 1 anterolisthesis at L3-L4, mild retrolisthesis at both L4-L5 and L5-S1. Associated disc, endplate, and posterior element degeneration.   2. Subsequent: - L3-L4 mild spinal and right lateral recess stenosis. - L4-L5 caudal disc herniation in the midline contributing to moderate lateral recess stenosis greater on the left, and moderate foraminal stenosis greater on the right. - L5-S1 mild left lateral recess and mild to moderate left foraminal stenosis. 3. Query left L5 and right L4 radiculitis.     Electronically Signed   By: Genevie Ann M.D.   On: 04/11/2021 06:25   I have personally reviewed the images and agree with the above interpretation.  Assessment and Plan: Mr. Pauwels is a pleasant 76 y.o. male with about 2 years of progressive low back pain without radiculopathy.  Despite a recent epidural steroid injection he continues to have low back pain.  It is sharp in nature.  This may be related to his facet arthropathy versus spondylolisthesis at L3-4 as there is a mechanical component to his symptoms.  I recommended that he undergo physical therapy for strengthening and conditioning of his core muscles as well as consideration of a facet injection.  I have placed referrals for both of these.  At this time I do not feel that surgical intervention primarily is indicated.  Should his symptoms persist is reasonable for [place] to see him back with dynamic x-rays to evaluate things further however the patient is not interested in surgical intervention at this time.  We will see him going forward on an as-needed basis.  He expressed understanding was in agreement with this plan.   Thank you for involving me in the care of this patient.   I spent a total of 34 minutes in both face-to-face and non-face-to-face activities for this visit on the date of this encounter including review of outside records, review of imaging, HPI,  discussion of symptoms, physical exam, discussion of treatment options, and documentation.   [facility] of [facility]

## 2022-01-24 DIAGNOSIS — J449 Chronic obstructive pulmonary disease, unspecified: Secondary | ICD-10-CM | POA: Diagnosis not present

## 2022-02-02 DIAGNOSIS — E2749 Other adrenocortical insufficiency: Secondary | ICD-10-CM | POA: Diagnosis not present

## 2022-02-02 DIAGNOSIS — E039 Hypothyroidism, unspecified: Secondary | ICD-10-CM | POA: Diagnosis not present

## 2022-02-09 DIAGNOSIS — E039 Hypothyroidism, unspecified: Secondary | ICD-10-CM | POA: Diagnosis not present

## 2022-02-09 DIAGNOSIS — E2749 Other adrenocortical insufficiency: Secondary | ICD-10-CM | POA: Diagnosis not present

## 2022-02-15 DIAGNOSIS — M5440 Lumbago with sciatica, unspecified side: Secondary | ICD-10-CM | POA: Diagnosis not present

## 2022-02-15 DIAGNOSIS — G8929 Other chronic pain: Secondary | ICD-10-CM | POA: Diagnosis not present

## 2022-02-19 DIAGNOSIS — G4733 Obstructive sleep apnea (adult) (pediatric): Secondary | ICD-10-CM | POA: Diagnosis not present

## 2022-02-22 DIAGNOSIS — M5440 Lumbago with sciatica, unspecified side: Secondary | ICD-10-CM | POA: Diagnosis not present

## 2022-02-22 DIAGNOSIS — G8929 Other chronic pain: Secondary | ICD-10-CM | POA: Diagnosis not present

## 2022-02-24 DIAGNOSIS — M5440 Lumbago with sciatica, unspecified side: Secondary | ICD-10-CM | POA: Diagnosis not present

## 2022-02-24 DIAGNOSIS — G8929 Other chronic pain: Secondary | ICD-10-CM | POA: Diagnosis not present

## 2022-02-28 DIAGNOSIS — M5440 Lumbago with sciatica, unspecified side: Secondary | ICD-10-CM | POA: Diagnosis not present

## 2022-02-28 DIAGNOSIS — G8929 Other chronic pain: Secondary | ICD-10-CM | POA: Diagnosis not present

## 2022-03-02 DIAGNOSIS — G8929 Other chronic pain: Secondary | ICD-10-CM | POA: Diagnosis not present

## 2022-03-02 DIAGNOSIS — M5440 Lumbago with sciatica, unspecified side: Secondary | ICD-10-CM | POA: Diagnosis not present

## 2022-03-07 DIAGNOSIS — G8929 Other chronic pain: Secondary | ICD-10-CM | POA: Diagnosis not present

## 2022-03-07 DIAGNOSIS — M5440 Lumbago with sciatica, unspecified side: Secondary | ICD-10-CM | POA: Diagnosis not present

## 2022-03-08 DIAGNOSIS — J8283 Eosinophilic asthma: Secondary | ICD-10-CM | POA: Diagnosis not present

## 2022-03-09 DIAGNOSIS — G8929 Other chronic pain: Secondary | ICD-10-CM | POA: Diagnosis not present

## 2022-03-09 DIAGNOSIS — M5440 Lumbago with sciatica, unspecified side: Secondary | ICD-10-CM | POA: Diagnosis not present

## 2022-03-13 DIAGNOSIS — M5126 Other intervertebral disc displacement, lumbar region: Secondary | ICD-10-CM | POA: Diagnosis not present

## 2022-03-13 DIAGNOSIS — M48062 Spinal stenosis, lumbar region with neurogenic claudication: Secondary | ICD-10-CM | POA: Diagnosis not present

## 2022-03-13 DIAGNOSIS — M5416 Radiculopathy, lumbar region: Secondary | ICD-10-CM | POA: Diagnosis not present

## 2022-03-13 DIAGNOSIS — M5442 Lumbago with sciatica, left side: Secondary | ICD-10-CM | POA: Diagnosis not present

## 2022-03-13 DIAGNOSIS — M5441 Lumbago with sciatica, right side: Secondary | ICD-10-CM | POA: Diagnosis not present

## 2022-03-13 DIAGNOSIS — G8929 Other chronic pain: Secondary | ICD-10-CM | POA: Diagnosis not present

## 2022-03-14 DIAGNOSIS — M5440 Lumbago with sciatica, unspecified side: Secondary | ICD-10-CM | POA: Diagnosis not present

## 2022-03-14 DIAGNOSIS — G8929 Other chronic pain: Secondary | ICD-10-CM | POA: Diagnosis not present

## 2022-03-16 DIAGNOSIS — M5440 Lumbago with sciatica, unspecified side: Secondary | ICD-10-CM | POA: Diagnosis not present

## 2022-03-16 DIAGNOSIS — G8929 Other chronic pain: Secondary | ICD-10-CM | POA: Diagnosis not present

## 2022-03-21 DIAGNOSIS — G8929 Other chronic pain: Secondary | ICD-10-CM | POA: Diagnosis not present

## 2022-03-21 DIAGNOSIS — M5440 Lumbago with sciatica, unspecified side: Secondary | ICD-10-CM | POA: Diagnosis not present

## 2022-03-22 ENCOUNTER — Ambulatory Visit
Admission: RE | Admit: 2022-03-22 | Discharge: 2022-03-22 | Disposition: A | Discharge status: Home / Self Care | Payer: PPO | Source: Ambulatory Visit | Attending: Oncology | Admitting: Oncology

## 2022-03-22 DIAGNOSIS — C3492 Malignant neoplasm of unspecified part of left bronchus or lung: Secondary | ICD-10-CM | POA: Insufficient documentation

## 2022-03-22 DIAGNOSIS — N2881 Hypertrophy of kidney: Secondary | ICD-10-CM | POA: Diagnosis not present

## 2022-03-22 DIAGNOSIS — R590 Localized enlarged lymph nodes: Secondary | ICD-10-CM | POA: Diagnosis not present

## 2022-03-22 DIAGNOSIS — J449 Chronic obstructive pulmonary disease, unspecified: Secondary | ICD-10-CM | POA: Diagnosis not present

## 2022-03-22 DIAGNOSIS — I700 Atherosclerosis of aorta: Secondary | ICD-10-CM | POA: Diagnosis not present

## 2022-03-22 DIAGNOSIS — M160 Bilateral primary osteoarthritis of hip: Secondary | ICD-10-CM | POA: Diagnosis not present

## 2022-03-22 DIAGNOSIS — J8410 Pulmonary fibrosis, unspecified: Secondary | ICD-10-CM | POA: Diagnosis not present

## 2022-03-22 DIAGNOSIS — J432 Centrilobular emphysema: Secondary | ICD-10-CM | POA: Diagnosis not present

## 2022-03-22 DIAGNOSIS — J189 Pneumonia, unspecified organism: Secondary | ICD-10-CM | POA: Diagnosis not present

## 2022-03-22 DIAGNOSIS — N261 Atrophy of kidney (terminal): Secondary | ICD-10-CM | POA: Diagnosis not present

## 2022-03-27 DIAGNOSIS — R531 Weakness: Secondary | ICD-10-CM | POA: Diagnosis not present

## 2022-03-27 DIAGNOSIS — M5440 Lumbago with sciatica, unspecified side: Secondary | ICD-10-CM | POA: Diagnosis not present

## 2022-03-27 DIAGNOSIS — G8929 Other chronic pain: Secondary | ICD-10-CM | POA: Diagnosis not present

## 2022-03-27 DIAGNOSIS — G4733 Obstructive sleep apnea (adult) (pediatric): Secondary | ICD-10-CM | POA: Diagnosis not present

## 2022-03-28 ENCOUNTER — Inpatient Hospital Stay: Payer: PPO

## 2022-03-28 ENCOUNTER — Inpatient Hospital Stay: Payer: PPO | Attending: Oncology | Admitting: Oncology

## 2022-03-28 ENCOUNTER — Encounter: Payer: Self-pay | Admitting: Oncology

## 2022-03-28 VITALS — BP 137/81 | HR 72 | Temp 97.6°F | Resp 18 | Wt 247.9 lb

## 2022-03-28 DIAGNOSIS — E032 Hypothyroidism due to medicaments and other exogenous substances: Secondary | ICD-10-CM | POA: Diagnosis not present

## 2022-03-28 DIAGNOSIS — Z95828 Presence of other vascular implants and grafts: Secondary | ICD-10-CM

## 2022-03-28 DIAGNOSIS — N189 Chronic kidney disease, unspecified: Secondary | ICD-10-CM | POA: Diagnosis not present

## 2022-03-28 DIAGNOSIS — Z85118 Personal history of other malignant neoplasm of bronchus and lung: Secondary | ICD-10-CM | POA: Diagnosis not present

## 2022-03-28 DIAGNOSIS — E2740 Unspecified adrenocortical insufficiency: Secondary | ICD-10-CM | POA: Insufficient documentation

## 2022-03-28 DIAGNOSIS — I129 Hypertensive chronic kidney disease with stage 1 through stage 4 chronic kidney disease, or unspecified chronic kidney disease: Secondary | ICD-10-CM | POA: Insufficient documentation

## 2022-03-28 DIAGNOSIS — D472 Monoclonal gammopathy: Secondary | ICD-10-CM | POA: Insufficient documentation

## 2022-03-28 DIAGNOSIS — Z7952 Long term (current) use of systemic steroids: Secondary | ICD-10-CM | POA: Diagnosis not present

## 2022-03-28 DIAGNOSIS — E876 Hypokalemia: Secondary | ICD-10-CM | POA: Insufficient documentation

## 2022-03-28 DIAGNOSIS — Z8546 Personal history of malignant neoplasm of prostate: Secondary | ICD-10-CM | POA: Diagnosis not present

## 2022-03-28 DIAGNOSIS — Z803 Family history of malignant neoplasm of breast: Secondary | ICD-10-CM | POA: Insufficient documentation

## 2022-03-28 DIAGNOSIS — C3492 Malignant neoplasm of unspecified part of left bronchus or lung: Secondary | ICD-10-CM | POA: Diagnosis not present

## 2022-03-28 LAB — CBC WITH DIFFERENTIAL/PLATELET
Abs Immature Granulocytes: 0.05 10*3/uL (ref 0.00–0.07)
Basophils Absolute: 0.1 10*3/uL (ref 0.0–0.1)
Basophils Relative: 1 %
Eosinophils Absolute: 0.2 10*3/uL (ref 0.0–0.5)
Eosinophils Relative: 4 %
HCT: 39.9 % (ref 39.0–52.0)
Hemoglobin: 12.8 g/dL — ABNORMAL LOW (ref 13.0–17.0)
Immature Granulocytes: 1 %
Lymphocytes Relative: 18 %
Lymphs Abs: 1 10*3/uL (ref 0.7–4.0)
MCH: 30.3 pg (ref 26.0–34.0)
MCHC: 32.1 g/dL (ref 30.0–36.0)
MCV: 94.5 fL (ref 80.0–100.0)
Monocytes Absolute: 0.7 10*3/uL (ref 0.1–1.0)
Monocytes Relative: 13 %
Neutro Abs: 3.5 10*3/uL (ref 1.7–7.7)
Neutrophils Relative %: 63 %
Platelets: 135 10*3/uL — ABNORMAL LOW (ref 150–400)
RBC: 4.22 MIL/uL (ref 4.22–5.81)
RDW: 15.2 % (ref 11.5–15.5)
WBC: 5.5 10*3/uL (ref 4.0–10.5)
nRBC: 0 % (ref 0.0–0.2)

## 2022-03-28 LAB — COMPREHENSIVE METABOLIC PANEL
ALT: 25 U/L (ref 0–44)
AST: 31 U/L (ref 15–41)
Albumin: 3.4 g/dL — ABNORMAL LOW (ref 3.5–5.0)
Alkaline Phosphatase: 38 U/L (ref 38–126)
Anion gap: 6 (ref 5–15)
BUN: 22 mg/dL (ref 8–23)
CO2: 23 mmol/L (ref 22–32)
Calcium: 8.1 mg/dL — ABNORMAL LOW (ref 8.9–10.3)
Chloride: 110 mmol/L (ref 98–111)
Creatinine, Ser: 1.36 mg/dL — ABNORMAL HIGH (ref 0.61–1.24)
GFR, Estimated: 54 mL/min — ABNORMAL LOW (ref >60)
Glucose, Bld: 101 mg/dL — ABNORMAL HIGH (ref 70–99)
Potassium: 3.3 mmol/L — ABNORMAL LOW (ref 3.5–5.1)
Sodium: 139 mmol/L (ref 135–145)
Total Bilirubin: 0.7 mg/dL (ref 0.3–1.2)
Total Protein: 6 g/dL — ABNORMAL LOW (ref 6.5–8.1)

## 2022-03-28 MED ORDER — HEPARIN SOD (PORK) LOCK FLUSH 100 UNIT/ML IV SOLN
500.0000 [IU] | Freq: Once | INTRAVENOUS | Status: AC
  Administered 2022-03-28: 500 [IU] via INTRAVENOUS
  Filled 2022-03-28: qty 5

## 2022-03-28 MED ORDER — SODIUM CHLORIDE 0.9% FLUSH
10.0000 mL | Freq: Once | INTRAVENOUS | Status: AC
  Administered 2022-03-28: 10 mL via INTRAVENOUS
  Filled 2022-03-28: qty 10

## 2022-03-28 NOTE — Assessment & Plan Note (Signed)
Continue observation.  Myeloma panel is pending.

## 2022-03-28 NOTE — Assessment & Plan Note (Addendum)
#  Stage IIIB lung adenocarcinoma, s/p concurrent chemo/RT and immunotherapy, finished all treatment in January 2020 Patient has been doing very well clinically, No CT evidence of cancer recurrence.  Continue annual CT chest abdomen pelvis without contrast

## 2022-03-28 NOTE — Progress Notes (Signed)
Patient here for follow up

## 2022-03-28 NOTE — Progress Notes (Signed)
Hematology/Oncology Progress note [phone] [phone]      Patient Care Team: [name], MD as PCP - General (Family Medicine) [name], [name], MD (General Surgery) [name], MD as Consulting Physician (Oncology) [name], RN as Registered Nurse   ASSESSMENT & PLAN:   Adenocarcinoma of left lung, stage 3 ([place]) #Stage IIIB lung adenocarcinoma, s/p concurrent chemo/RT and immunotherapy, finished all treatment in January 2020 Patient has been doing very well clinically, No CT evidence of cancer recurrence.  Continue annual CT chest abdomen pelvis without contrast   History of prostate cancer # TURP procedure done on 02/14/2019.  Pathology showed prostate adenocarcinoma, Gleason score 3+3, tumor quantitation, estimated percentage of prostate tissue involved by tumor less than 5%. No perineural invasion identified. Patient follows up with urologist and was recommended to observe.  MGUS (monoclonal gammopathy of unknown significance) Continue observation.  Myeloma panel is pending.   Port-A-Cath in place Patient is interested in having Medi port removed. Discussed with Dr.Byrnett whose office will call patient to arrange port removal. Cancel port flush appt.   Hypothyroidism due to medication Continue Synthroid. Follow up with endocrinologist.   Adrenal insufficiency [facility]) Recommend patient to further discuss with endocrinology regarding his recent dose reduction of steroid. His weakness and hypokalemia may be due to decreased dose.   Orders Placed This Encounter  Procedures   CBC with Differential/Platelet    Standing Status:   Future    Standing Expiration Date:   03/29/2023   Comprehensive metabolic panel    Standing Status:   Future    Standing Expiration Date:   03/28/2023   Kappa/lambda light chains    Standing Status:   Future    Standing Expiration Date:   03/28/2023   Multiple Myeloma Panel (SPEP&IFE w/QIG)    Standing Status:    Future    Standing Expiration Date:   03/28/2023   Follow up in 6 months.   All questions were answered. The patient knows to call the clinic with any problems, questions or concerns.  [name], MD, PhD [facility] Hematology Oncology 03/28/2022   Reason for Visit:  Follow up for lung cancer, immunotherapy side effects, adrenal insufficiency.  HISTORY OF PRESENTING ILLNESS:  Marc Gray 76 y.o.  male who presents for treatment of Stage IIIB (cT2b, cN3, cM0)  Adenocarcinoma of lung.  # Patient is s/p [place] and taxol concurrent RT for treatment of Stage IIIB Lung cancer. He recieved additional lung boost RT finished one year of Durvalumab -January 2020  # Colon CA was listed in his history, however reviewing his surgical pathology from colonoscopy on 04/15/2014, during which he had polyps removed and all are negative for cancer.patient is scheduled for colonoscopy screening this year.  # 5/1/ 2019 CT scan was discussed at tumor board and changes consistent with radiation changes  # TURP procedure done on 02/14/2019.  Pathology showed prostate adenocarcinoma, Gleason score 3+3, tumor quantitation, estimated percentage of prostate tissue involved by tumor less than 5%. No perineural invasion identified. Patient follows up with urologist and was recommended to observe.  03/07/2021, CT without contrast showed unchanged posttreatment/postradiation fibrosis and consolidation of the perihilar left lung and a paramedian left upper lobe.  Unchanged 4 mm nodules of the right lower lobe.  Almost certainly benign and incidental.  Emphysema.  Coronary artery disease.  INTERVAL HISTORY Patient he presents for follow-up of fo stage IIIB adenocarcinoma of the lung. Patient follows up with endocrinology for adrenal insufficiency and hypothyroidism.  Patient  was previously on prednisone 10 mg in a.m., 2.5 mg in p.m. currently patient reports that he is just taking prednisone 5 mg daily,  advised by pulmonology. He has noticed some proximal muscle weakness recently.  Review of Systems  Constitutional:  Negative for appetite change, chills, fatigue, fever and unexpected weight change.  HENT:   Negative for hearing loss and voice change.   Eyes:  Negative for eye problems and icterus.  Respiratory:  Negative for chest tightness, cough and shortness of breath.   Cardiovascular:  Negative for chest pain and leg swelling.  Gastrointestinal:  Negative for abdominal distention, abdominal pain and blood in stool.  Endocrine: Negative for hot flashes.  Genitourinary:  Negative for difficulty urinating, dysuria and frequency.   Musculoskeletal:  Positive for arthralgias and back pain.  Skin:  Negative for itching and rash.  Neurological:  Negative for extremity weakness, light-headedness and numbness.  Hematological:  Negative for adenopathy. Does not bruise/bleed easily.  Psychiatric/Behavioral:  Negative for confusion.     MEDICAL HISTORY:  Past Medical History:  Diagnosis Date   Anxiety    Aortic atherosclerosis (HCC)    Arthritis    SHOULDER   BPH (benign prostatic hyperplasia)    Cataract    Chronic kidney disease    low kidney function   Colon adenomas    Colon cancer ([place]) 2015   Pt states during his colonoscopy he had cancerous polyps removed.    COPD (chronic obstructive pulmonary disease) (HCC)    Depression    SINCE DIAGNOSIS   Dyspnea    DOE   Enlarged prostate    GERD (gastroesophageal reflux disease)    Hypertension    Hypothyroidism    Lung cancer ([place])    Aug 2018   Pain    DUE TO LUNG ISSUE    SURGICAL HISTORY: Past Surgical History:  Procedure Laterality Date   BACK SURGERY     kyphoplasty  T5-6   CATARACT EXTRACTION W/ INTRAOCULAR LENS  IMPLANT, BILATERAL     COLONOSCOPY     COLONOSCOPY WITH PROPOFOL N/A 12/17/2019   Procedure: COLONOSCOPY WITH PROPOFOL;  Surgeon: Robert Bellow, MD;  Location: [facility];  Service: Endoscopy;   Laterality: N/A;   EYE SURGERY Bilateral    cataract   LYMPH GLAND EXCISION N/A 02/09/2017   Procedure: CERVICAL LYMPH NODE BIOPSY;  Surgeon: Robert Bellow, MD;  Location: [facility];  Service: General;  Laterality: N/A;   PORTACATH PLACEMENT Right 02/23/2017   Procedure: INSERTION PORT-A-CATH;  Surgeon: Robert Bellow, MD;  Location: [facility];  Service: General;  Laterality: Right;   PROSTATE SURGERY     TONSILLECTOMY     TRANSURETHRAL RESECTION OF PROSTATE N/A 02/14/2019   Procedure: TRANSURETHRAL RESECTION OF THE PROSTATE (TURP);  Surgeon: [name], MD;  Location: [facility];  Service: Urology;  Laterality: N/A;    SOCIAL HISTORY: Social History   Socioeconomic History   Marital status: Married    Spouse name: Not on file   Number of children: Not on file   Years of education: Not on file   Highest education level: Not on file  Occupational History   Not on file  Tobacco Use   Smoking status: Former    Packs/day: 1.00    Years: 53.00    Total pack years: 53.00    Types: Cigarettes    Quit date: 04/14/2017    Years since quitting: 4.9    Passive exposure: Past  Smokeless tobacco: Never   Tobacco comments:    Quit November 2018  Vaping Use   Vaping Use: Never used  Substance and Sexual Activity   Alcohol use: Not Currently    Comment: occassional   Drug use: No   Sexual activity: Yes  Other Topics Concern   Not on file  Social History Narrative   Not on file   Social Determinants of Health   Financial Resource Strain: Not on file  Food Insecurity: Not on file  Transportation Needs: Not on file  Physical Activity: Not on file  Stress: Not on file  Social Connections: Not on file  Intimate Partner Violence: Not on file    FAMILY HISTORY: Family History  Problem Relation Age of Onset   Breast cancer Maternal Grandmother    Dementia Mother    Diabetes Father    Stroke Father     ALLERGIES:  is allergic to morphine and  related.  MEDICATIONS:  Current Outpatient Medications  Medication Sig Dispense Refill   acetaminophen (TYLENOL) 500 MG tablet Take 1,000 mg by mouth every 6 (six) hours as needed for moderate pain.     aspirin EC 81 MG tablet Take 1 tablet (81 mg total) by mouth daily. Swallow whole. 90 tablet 0   Azelastine HCl 137 MCG/SPRAY SOLN SMARTSIG:1-2 Spray(s) Both Nares Twice Daily     BREZTRI AEROSPHERE 160-9-4.8 MCG/ACT AERO Inhale 2 puffs into the lungs 2 (two) times daily.     calcium-vitamin D (OSCAL WITH D) 500-200 MG-UNIT tablet Take 1 tablet by mouth daily. 90 tablet 1   diphenhydrAMINE-zinc acetate (BENADRYL EXTRA STRENGTH) cream Apply 1 application topically 3 (three) times daily as needed for itching. 28 g 0   dupilumab (DUPIXENT) 200 MG/1.14ML prefilled syringe Inject 200 mg into the skin every 14 (fourteen) days.     fluticasone (FLONASE) 50 MCG/ACT nasal spray Place 1 spray into both nostrils daily. 16 g 2   furosemide (LASIX) 20 MG tablet Take by mouth.     Ipratropium-Albuterol (COMBIVENT) 20-100 MCG/ACT AERS respimat Inhale into the lungs.     levothyroxine (SYNTHROID) 175 MCG tablet      lidocaine-prilocaine (EMLA) cream Apply over port 1-2 hours prior to chemotherapy treatment.  Cover with plastic wrap. 30 g 1   Melatonin 10 MG TABS Take 10 mg by mouth at bedtime.      montelukast (SINGULAIR) 10 MG tablet Take 10 mg by mouth daily.     Multiple Vitamin (MULTI-VITAMINS) TABS Take 1 tablet by mouth daily.      pantoprazole (PROTONIX) 40 MG tablet Take 1 tablet (40 mg total) by mouth daily. 90 tablet 0   predniSONE (DELTASONE) 5 MG tablet Take 5 mg by mouth daily.     rosuvastatin (CRESTOR) 10 MG tablet      sertraline (ZOLOFT) 25 MG tablet TAKE 1 TABLET(25 MG) BY MOUTH DAILY 90 tablet 0   tadalafil (CIALIS) 5 MG tablet Take 5-89m by mouth as needed prior to intercourse 30 tablet 11   tamsulosin (FLOMAX) 0.4 MG CAPS capsule Take 1 capsule (0.4 mg total) by mouth daily. 90 capsule  1   traZODone (DESYREL) 100 MG tablet Take 100 mg by mouth at bedtime.     triamcinolone ointment (KENALOG) 0.5 % Apply 1 application topically 3 (three) times daily as needed. 30 g 1   Chlorpheniramine-Phenylephrine 4-10 MG tablet Take 1 tablet by mouth every 6 (six) hours as needed for allergies. (Patient not taking: Reported on 03/28/2022)  ondansetron (ZOFRAN) 8 MG tablet Take 1 tablet (8 mg total) by mouth every 12 (twelve) hours as needed for nausea or vomiting. (Patient not taking: Reported on 03/28/2022) 60 tablet 0   prochlorperazine (COMPAZINE) 10 MG tablet Take 1 tablet (10 mg total) by mouth every 6 (six) hours as needed for nausea or vomiting. (Patient not taking: Reported on 03/28/2022) 60 tablet 2   No current facility-administered medications for this visit.   Facility-Administered Medications Ordered in Other Visits  Medication Dose Route Frequency Provider Last Rate Last Admin   sodium chloride flush (NS) 0.9 % injection 10 mL  10 mL Intravenous PRN [name], MD   10 mL at 04/03/17 4481      .  PHYSICAL EXAMINATION: ECOG PERFORMANCE STATUS: 1 - Symptomatic but completely ambulatory Vitals:   03/28/22 1059  BP: 137/81  Pulse: 72  Resp: 18  Temp: 97.6 F (36.4 C)  SpO2: 96%   Filed Weights   03/28/22 1059  Weight: 247 lb 14.4 oz (112.4 kg)  Physical Exam Constitutional:      General: He is not in acute distress.    Appearance: He is obese. He is not diaphoretic.  HENT:     Head: Normocephalic and atraumatic.     Nose: Nose normal.     Mouth/Throat:     Pharynx: No oropharyngeal exudate.  Eyes:     General: No scleral icterus.    Pupils: Pupils are equal, round, and reactive to light.  Cardiovascular:     Rate and Rhythm: Normal rate and regular rhythm.     Heart sounds: No murmur heard. Pulmonary:     Effort: Pulmonary effort is normal. No respiratory distress.     Breath sounds: No rales.  Chest:     Chest wall: No tenderness.  Abdominal:      General: There is no distension.     Palpations: Abdomen is soft.     Tenderness: There is no abdominal tenderness.  Musculoskeletal:        General: Normal range of motion.     Cervical back: Normal range of motion and neck supple.  Skin:    General: Skin is warm and dry.  Neurological:     Mental Status: He is alert and oriented to person, place, and time.     Cranial Nerves: No cranial nerve deficit.     Motor: No abnormal muscle tone.     Coordination: Coordination normal.  Psychiatric:        Mood and Affect: Affect normal.    LABORATORY DATA:  I have reviewed the data as listed    Latest Ref Rng & Units 03/28/2022   10:02 AM 09/20/2021    9:54 AM 08/16/2021    1:09 PM  CBC  WBC 4.0 - 10.5 K/uL 5.5  8.1  11.4   Hemoglobin 13.0 - 17.0 g/dL 12.8  13.4  13.5   Hematocrit 39.0 - 52.0 % 39.9  41.5  41.8   Platelets 150 - 400 K/uL 135  140  149       Latest Ref Rng & Units 03/28/2022   10:02 AM 09/20/2021    9:54 AM 08/16/2021    1:09 PM  CMP  Glucose 70 - 99 mg/dL 101  108  111   BUN 8 - 23 mg/dL _0 Creatinine 0.61 - 1.24 mg/dL 1.36  1.34  1.50   Sodium 135 - 145 mmol/L 139  137  135  Potassium 3.5 - 5.1 mmol/L 3.3  3.4  3.9   Chloride 98 - 111 mmol/L 110  107  101   CO2 22 - 32 mmol/L _0 Calcium 8.9 - 10.3 mg/dL 8.1  8.4  8.8   Total Protein 6.5 - 8.1 g/dL 6.0  6.4  6.9   Total Bilirubin 0.3 - 1.2 mg/dL 0.7  0.4  0.9   Alkaline Phos 38 - 126 U/L 38  38  41   AST 15 - 41 U/L _1 ALT 0 - 44 U/L _2 RADIOGRAPHIC STUDIES: I have personally reviewed the radiological images as listed and agreed with the findings in the report. CT CHEST ABDOMEN PELVIS WO CONTRAST  Result Date: 03/24/2022 CLINICAL DATA:  History of left lung cancer diagnosed in 2019 status post chemo radiation and immunotherapy. Follow-up. * Tracking Code: BO * EXAM: CT CHEST, ABDOMEN AND PELVIS WITHOUT CONTRAST TECHNIQUE: Multidetector CT imaging of the chest,  abdomen and pelvis was performed following the standard protocol without IV contrast. RADIATION DOSE REDUCTION: This exam was performed according to the departmental dose-optimization program which includes automated exposure control, adjustment of the mA and/or kV according to patient size and/or use of iterative reconstruction technique. COMPARISON:  Multiple priors including most recent CT chest abdomen and pelvis dated September 06, 2021. FINDINGS: CT CHEST FINDINGS Cardiovascular: Right chest Port-A-Cath with tip in the superior vena cava. Aortic atherosclerosis. Normal size heart. Trace pericardial effusion is similar prior. Coronary artery calcifications. Mediastinum/Nodes: No pathologically enlarged mediastinal, hilar or axillary lymph nodes, noting limited sensitivity for the detection of hilar adenopathy on this noncontrast study. The esophagus is grossly unremarkable. Lungs/Pleura: Stable posttreatment/postradiation fibrosis and consolidation in the perihilar left lung and paramedian left upper lobe with left lung volume loss and slight leftward mediastinal shift. Stable right lower lobe pulmonary nodule measuring 4 mm on image 87/3. No new suspicious pulmonary nodules or masses. Paraseptal and centrilobular emphysema. No pleural effusion. No pneumothorax. Musculoskeletal: Prior T5 and T7 vertebral body augmentation. Postradiation change in the thoracic spine. No aggressive lytic or blastic lesion of bone. CT ABDOMEN PELVIS FINDINGS Hepatobiliary: No suspicious hepatic lesion on this noncontrast enhanced examination. Gallbladder is unremarkable. No biliary ductal dilation. Pancreas: No pancreatic ductal dilation or evidence of acute inflammation. Spleen: No splenomegaly. Adrenals/Urinary Tract: Bilateral adrenal glands appear normal. No hydronephrosis. Stable right renal cyst considered benign requiring no independent imaging follow-up. Similar left renal atrophy with compensatory hypertrophy of the right  kidney. Distended urinary bladder with a TURP defect in the prostate gland. Stomach/Bowel: Radiopaque enteric contrast material traverses the splenic flexure. Stomach is minimally distended limiting evaluation. No pathologic dilation of small or large bowel. Moderate volume of formed stool throughout the colon. No evidence of acute bowel inflammation. Vascular/Lymphatic: Aortic and branch vessel atherosclerosis. No pathologically enlarged abdominal or pelvic lymph nodes. Reproductive: TURP defect in the prostate gland. Other: No significant abdominopelvic free fluid. Musculoskeletal: No suspicious lytic or blastic lesion of bone. Multilevel degenerative changes spine. Mild degenerative change of the bilateral hips. IMPRESSION: 1. Stable posttreatment/postradiation fibrosis in the perihilar left lung and paramedian left upper lobe common no evidence of local recurrence. 2. Stable 4 mm right lower lobe pulmonary nodule chronically stable and favored benign. 3. No convincing evidence of metastatic disease within the chest, abdomen or pelvis. 4. Moderate volume of formed stool throughout the colon. Correlate for constipation. 5. Aortic Atherosclerosis (  ICD10-I70.0) and Emphysema (ICD10-J43.9). Electronically Signed   By: Dahlia Bailiff M.D.   On: 03/24/2022 09:57

## 2022-03-28 NOTE — Assessment & Plan Note (Signed)
#   TURP procedure done on 02/14/2019.  Pathology showed prostate adenocarcinoma, Gleason score 3+3, tumor quantitation, estimated percentage of prostate tissue involved by tumor less than 5%. No perineural invasion identified. Patient follows up with urologist and was recommended to observe.

## 2022-03-28 NOTE — Assessment & Plan Note (Signed)
Patient is interested in having Medi port removed. Discussed with Dr.Byrnett whose office will call patient to arrange port removal. Cancel port flush appt.

## 2022-03-28 NOTE — Assessment & Plan Note (Signed)
Continue Synthroid. Follow up with endocrinologist.

## 2022-03-28 NOTE — Assessment & Plan Note (Signed)
Recommend patient to further discuss with endocrinology regarding his recent dose reduction of steroid. His weakness and hypokalemia may be due to decreased dose.

## 2022-03-29 LAB — KAPPA/LAMBDA LIGHT CHAINS
Kappa free light chain: 51.5 mg/L — ABNORMAL HIGH (ref 3.3–19.4)
Kappa, lambda light chain ratio: 1.96 — ABNORMAL HIGH (ref 0.26–1.65)
Lambda free light chains: 26.3 mg/L (ref 5.7–26.3)

## 2022-03-30 DIAGNOSIS — M5126 Other intervertebral disc displacement, lumbar region: Secondary | ICD-10-CM | POA: Diagnosis not present

## 2022-03-30 DIAGNOSIS — M5416 Radiculopathy, lumbar region: Secondary | ICD-10-CM | POA: Diagnosis not present

## 2022-04-03 LAB — MULTIPLE MYELOMA PANEL, SERUM
Albumin SerPl Elph-Mcnc: 3.4 g/dL (ref 2.9–4.4)
Albumin/Glob SerPl: 1.5 (ref 0.7–1.7)
Alpha 1: 0.2 g/dL (ref 0.0–0.4)
Alpha2 Glob SerPl Elph-Mcnc: 0.7 g/dL (ref 0.4–1.0)
B-Globulin SerPl Elph-Mcnc: 0.8 g/dL (ref 0.7–1.3)
Gamma Glob SerPl Elph-Mcnc: 0.7 g/dL (ref 0.4–1.8)
Globulin, Total: 2.3 g/dL (ref 2.2–3.9)
IgA: 208 mg/dL (ref 61–437)
IgG (Immunoglobin G), Serum: 646 mg/dL (ref 603–1613)
IgM (Immunoglobulin M), Srm: 64 mg/dL (ref 15–143)
M Protein SerPl Elph-Mcnc: 0.3 g/dL — ABNORMAL HIGH
Total Protein ELP: 5.7 g/dL — ABNORMAL LOW (ref 6.0–8.5)

## 2022-04-06 DIAGNOSIS — J8283 Eosinophilic asthma: Secondary | ICD-10-CM | POA: Diagnosis not present

## 2022-04-06 DIAGNOSIS — G8929 Other chronic pain: Secondary | ICD-10-CM | POA: Diagnosis not present

## 2022-04-06 DIAGNOSIS — R531 Weakness: Secondary | ICD-10-CM | POA: Diagnosis not present

## 2022-04-06 DIAGNOSIS — M5440 Lumbago with sciatica, unspecified side: Secondary | ICD-10-CM | POA: Diagnosis not present

## 2022-04-11 DIAGNOSIS — G8929 Other chronic pain: Secondary | ICD-10-CM | POA: Diagnosis not present

## 2022-04-11 DIAGNOSIS — M5440 Lumbago with sciatica, unspecified side: Secondary | ICD-10-CM | POA: Diagnosis not present

## 2022-04-13 DIAGNOSIS — G8929 Other chronic pain: Secondary | ICD-10-CM | POA: Diagnosis not present

## 2022-04-13 DIAGNOSIS — R531 Weakness: Secondary | ICD-10-CM | POA: Diagnosis not present

## 2022-04-13 DIAGNOSIS — M5440 Lumbago with sciatica, unspecified side: Secondary | ICD-10-CM | POA: Diagnosis not present

## 2022-04-18 DIAGNOSIS — M5441 Lumbago with sciatica, right side: Secondary | ICD-10-CM | POA: Diagnosis not present

## 2022-04-18 DIAGNOSIS — G8929 Other chronic pain: Secondary | ICD-10-CM | POA: Diagnosis not present

## 2022-04-18 DIAGNOSIS — M48062 Spinal stenosis, lumbar region with neurogenic claudication: Secondary | ICD-10-CM | POA: Diagnosis not present

## 2022-04-18 DIAGNOSIS — M5442 Lumbago with sciatica, left side: Secondary | ICD-10-CM | POA: Diagnosis not present

## 2022-04-25 DIAGNOSIS — C3492 Malignant neoplasm of unspecified part of left bronchus or lung: Secondary | ICD-10-CM | POA: Diagnosis not present

## 2022-04-25 DIAGNOSIS — Z452 Encounter for adjustment and management of vascular access device: Secondary | ICD-10-CM | POA: Diagnosis not present

## 2022-04-25 DIAGNOSIS — D122 Benign neoplasm of ascending colon: Secondary | ICD-10-CM | POA: Diagnosis not present

## 2022-05-03 DIAGNOSIS — D2272 Melanocytic nevi of left lower limb, including hip: Secondary | ICD-10-CM | POA: Diagnosis not present

## 2022-05-03 DIAGNOSIS — D2271 Melanocytic nevi of right lower limb, including hip: Secondary | ICD-10-CM | POA: Diagnosis not present

## 2022-05-03 DIAGNOSIS — L821 Other seborrheic keratosis: Secondary | ICD-10-CM | POA: Diagnosis not present

## 2022-05-03 DIAGNOSIS — J4550 Severe persistent asthma, uncomplicated: Secondary | ICD-10-CM | POA: Diagnosis not present

## 2022-05-03 DIAGNOSIS — D2262 Melanocytic nevi of left upper limb, including shoulder: Secondary | ICD-10-CM | POA: Diagnosis not present

## 2022-05-03 DIAGNOSIS — Z872 Personal history of diseases of the skin and subcutaneous tissue: Secondary | ICD-10-CM | POA: Diagnosis not present

## 2022-05-03 DIAGNOSIS — L570 Actinic keratosis: Secondary | ICD-10-CM | POA: Diagnosis not present

## 2022-05-03 DIAGNOSIS — X32XXXA Exposure to sunlight, initial encounter: Secondary | ICD-10-CM | POA: Diagnosis not present

## 2022-05-03 DIAGNOSIS — D225 Melanocytic nevi of trunk: Secondary | ICD-10-CM | POA: Diagnosis not present

## 2022-05-03 DIAGNOSIS — D2261 Melanocytic nevi of right upper limb, including shoulder: Secondary | ICD-10-CM | POA: Diagnosis not present

## 2022-05-03 DIAGNOSIS — Z09 Encounter for follow-up examination after completed treatment for conditions other than malignant neoplasm: Secondary | ICD-10-CM | POA: Diagnosis not present

## 2022-05-11 DIAGNOSIS — M48062 Spinal stenosis, lumbar region with neurogenic claudication: Secondary | ICD-10-CM | POA: Diagnosis not present

## 2022-05-17 DIAGNOSIS — J8283 Eosinophilic asthma: Secondary | ICD-10-CM | POA: Diagnosis not present

## 2022-05-17 DIAGNOSIS — J4550 Severe persistent asthma, uncomplicated: Secondary | ICD-10-CM | POA: Diagnosis not present

## 2022-05-23 DIAGNOSIS — J8283 Eosinophilic asthma: Secondary | ICD-10-CM | POA: Diagnosis not present

## 2022-05-23 DIAGNOSIS — J449 Chronic obstructive pulmonary disease, unspecified: Secondary | ICD-10-CM | POA: Diagnosis not present

## 2022-05-24 DIAGNOSIS — G8929 Other chronic pain: Secondary | ICD-10-CM | POA: Diagnosis not present

## 2022-05-24 DIAGNOSIS — M48062 Spinal stenosis, lumbar region with neurogenic claudication: Secondary | ICD-10-CM | POA: Diagnosis not present

## 2022-05-24 DIAGNOSIS — M5442 Lumbago with sciatica, left side: Secondary | ICD-10-CM | POA: Diagnosis not present

## 2022-05-24 DIAGNOSIS — M79642 Pain in left hand: Secondary | ICD-10-CM | POA: Diagnosis not present

## 2022-05-24 DIAGNOSIS — M5441 Lumbago with sciatica, right side: Secondary | ICD-10-CM | POA: Diagnosis not present

## 2022-06-01 DIAGNOSIS — J4550 Severe persistent asthma, uncomplicated: Secondary | ICD-10-CM | POA: Diagnosis not present

## 2022-06-01 DIAGNOSIS — J8283 Eosinophilic asthma: Secondary | ICD-10-CM | POA: Diagnosis not present

## 2022-06-06 DIAGNOSIS — J8283 Eosinophilic asthma: Secondary | ICD-10-CM | POA: Diagnosis not present

## 2022-06-06 DIAGNOSIS — J4550 Severe persistent asthma, uncomplicated: Secondary | ICD-10-CM | POA: Diagnosis not present

## 2022-06-16 ENCOUNTER — Encounter: Payer: Self-pay | Admitting: Urology

## 2022-06-20 DIAGNOSIS — J8283 Eosinophilic asthma: Secondary | ICD-10-CM | POA: Diagnosis not present

## 2022-06-20 DIAGNOSIS — J4550 Severe persistent asthma, uncomplicated: Secondary | ICD-10-CM | POA: Diagnosis not present

## 2022-06-24 DIAGNOSIS — R5381 Other malaise: Secondary | ICD-10-CM | POA: Diagnosis not present

## 2022-06-24 DIAGNOSIS — R5383 Other fatigue: Secondary | ICD-10-CM | POA: Diagnosis not present

## 2022-06-26 DIAGNOSIS — G4733 Obstructive sleep apnea (adult) (pediatric): Secondary | ICD-10-CM | POA: Diagnosis not present

## 2022-07-04 DIAGNOSIS — J4550 Severe persistent asthma, uncomplicated: Secondary | ICD-10-CM | POA: Diagnosis not present

## 2022-07-04 DIAGNOSIS — J8283 Eosinophilic asthma: Secondary | ICD-10-CM | POA: Diagnosis not present

## 2022-07-18 DIAGNOSIS — J8283 Eosinophilic asthma: Secondary | ICD-10-CM | POA: Diagnosis not present

## 2022-07-18 DIAGNOSIS — J4550 Severe persistent asthma, uncomplicated: Secondary | ICD-10-CM | POA: Diagnosis not present

## 2022-07-21 DIAGNOSIS — Z0000 Encounter for general adult medical examination without abnormal findings: Secondary | ICD-10-CM | POA: Diagnosis not present

## 2022-07-21 DIAGNOSIS — D649 Anemia, unspecified: Secondary | ICD-10-CM | POA: Diagnosis not present

## 2022-07-21 DIAGNOSIS — E032 Hypothyroidism due to medicaments and other exogenous substances: Secondary | ICD-10-CM | POA: Diagnosis not present

## 2022-07-21 DIAGNOSIS — Z87891 Personal history of nicotine dependence: Secondary | ICD-10-CM | POA: Diagnosis not present

## 2022-07-21 DIAGNOSIS — E785 Hyperlipidemia, unspecified: Secondary | ICD-10-CM | POA: Diagnosis not present

## 2022-07-25 DIAGNOSIS — Z87440 Personal history of urinary (tract) infections: Secondary | ICD-10-CM | POA: Diagnosis not present

## 2022-07-25 DIAGNOSIS — N3281 Overactive bladder: Secondary | ICD-10-CM | POA: Diagnosis not present

## 2022-07-25 DIAGNOSIS — N958 Other specified menopausal and perimenopausal disorders: Secondary | ICD-10-CM | POA: Diagnosis not present

## 2022-07-27 DIAGNOSIS — Z7951 Long term (current) use of inhaled steroids: Secondary | ICD-10-CM | POA: Diagnosis not present

## 2022-07-27 DIAGNOSIS — C3492 Malignant neoplasm of unspecified part of left bronchus or lung: Secondary | ICD-10-CM | POA: Diagnosis not present

## 2022-07-27 DIAGNOSIS — Z1211 Encounter for screening for malignant neoplasm of colon: Secondary | ICD-10-CM | POA: Diagnosis not present

## 2022-07-27 DIAGNOSIS — J449 Chronic obstructive pulmonary disease, unspecified: Secondary | ICD-10-CM | POA: Diagnosis not present

## 2022-07-27 DIAGNOSIS — D692 Other nonthrombocytopenic purpura: Secondary | ICD-10-CM | POA: Diagnosis not present

## 2022-07-27 DIAGNOSIS — Z0000 Encounter for general adult medical examination without abnormal findings: Secondary | ICD-10-CM | POA: Diagnosis not present

## 2022-07-27 DIAGNOSIS — I700 Atherosclerosis of aorta: Secondary | ICD-10-CM | POA: Diagnosis not present

## 2022-07-27 DIAGNOSIS — E2740 Unspecified adrenocortical insufficiency: Secondary | ICD-10-CM | POA: Diagnosis not present

## 2022-07-27 DIAGNOSIS — Z6836 Body mass index (BMI) 36.0-36.9, adult: Secondary | ICD-10-CM | POA: Diagnosis not present

## 2022-07-27 DIAGNOSIS — N1832 Chronic kidney disease, stage 3b: Secondary | ICD-10-CM | POA: Diagnosis not present

## 2022-07-27 DIAGNOSIS — R0789 Other chest pain: Secondary | ICD-10-CM | POA: Diagnosis not present

## 2022-07-27 DIAGNOSIS — E032 Hypothyroidism due to medicaments and other exogenous substances: Secondary | ICD-10-CM | POA: Diagnosis not present

## 2022-07-27 DIAGNOSIS — I739 Peripheral vascular disease, unspecified: Secondary | ICD-10-CM | POA: Diagnosis not present

## 2022-08-01 DIAGNOSIS — J8283 Eosinophilic asthma: Secondary | ICD-10-CM | POA: Diagnosis not present

## 2022-08-01 DIAGNOSIS — J4550 Severe persistent asthma, uncomplicated: Secondary | ICD-10-CM | POA: Diagnosis not present

## 2022-08-10 DIAGNOSIS — E2749 Other adrenocortical insufficiency: Secondary | ICD-10-CM | POA: Diagnosis not present

## 2022-08-10 DIAGNOSIS — E039 Hypothyroidism, unspecified: Secondary | ICD-10-CM | POA: Diagnosis not present

## 2022-08-15 DIAGNOSIS — J8283 Eosinophilic asthma: Secondary | ICD-10-CM | POA: Diagnosis not present

## 2022-08-15 DIAGNOSIS — J4550 Severe persistent asthma, uncomplicated: Secondary | ICD-10-CM | POA: Diagnosis not present

## 2022-08-17 DIAGNOSIS — E039 Hypothyroidism, unspecified: Secondary | ICD-10-CM | POA: Diagnosis not present

## 2022-08-17 DIAGNOSIS — E2749 Other adrenocortical insufficiency: Secondary | ICD-10-CM | POA: Diagnosis not present

## 2022-08-29 DIAGNOSIS — J8283 Eosinophilic asthma: Secondary | ICD-10-CM | POA: Diagnosis not present

## 2022-08-29 DIAGNOSIS — J4550 Severe persistent asthma, uncomplicated: Secondary | ICD-10-CM | POA: Diagnosis not present

## 2022-09-05 DIAGNOSIS — Z961 Presence of intraocular lens: Secondary | ICD-10-CM | POA: Diagnosis not present

## 2022-09-05 DIAGNOSIS — H10503 Unspecified blepharoconjunctivitis, bilateral: Secondary | ICD-10-CM | POA: Diagnosis not present

## 2022-09-12 ENCOUNTER — Telehealth: Payer: Self-pay | Admitting: *Deleted

## 2022-09-12 DIAGNOSIS — J8283 Eosinophilic asthma: Secondary | ICD-10-CM | POA: Diagnosis not present

## 2022-09-12 DIAGNOSIS — J4550 Severe persistent asthma, uncomplicated: Secondary | ICD-10-CM | POA: Diagnosis not present

## 2022-09-12 NOTE — Telephone Encounter (Signed)
Pt had last CT in October 2023, per last note: Continue annual CT chest abdomen pelvis without contrast

## 2022-09-12 NOTE — Telephone Encounter (Signed)
Patient calle asking if he needs to have a CT scan before he sees Dr Cathie Hoops on the 30th. Please advise

## 2022-09-12 NOTE — Telephone Encounter (Signed)
CAll returned to patient and informed him that he is not due a CT until October. He thanked me for letting him know

## 2022-09-20 DIAGNOSIS — G4733 Obstructive sleep apnea (adult) (pediatric): Secondary | ICD-10-CM | POA: Diagnosis not present

## 2022-09-25 DIAGNOSIS — G4733 Obstructive sleep apnea (adult) (pediatric): Secondary | ICD-10-CM | POA: Diagnosis not present

## 2022-09-26 ENCOUNTER — Inpatient Hospital Stay (HOSPITAL_BASED_OUTPATIENT_CLINIC_OR_DEPARTMENT_OTHER): Payer: HMO | Admitting: Oncology

## 2022-09-26 ENCOUNTER — Ambulatory Visit: Payer: PPO | Admitting: Oncology

## 2022-09-26 ENCOUNTER — Encounter: Payer: Self-pay | Admitting: Oncology

## 2022-09-26 ENCOUNTER — Inpatient Hospital Stay: Payer: HMO | Attending: Oncology

## 2022-09-26 ENCOUNTER — Other Ambulatory Visit: Payer: PPO

## 2022-09-26 VITALS — BP 130/78 | HR 78 | Temp 97.7°F | Resp 18 | Wt 240.8 lb

## 2022-09-26 DIAGNOSIS — E2740 Unspecified adrenocortical insufficiency: Secondary | ICD-10-CM | POA: Insufficient documentation

## 2022-09-26 DIAGNOSIS — Z87891 Personal history of nicotine dependence: Secondary | ICD-10-CM | POA: Diagnosis not present

## 2022-09-26 DIAGNOSIS — D472 Monoclonal gammopathy: Secondary | ICD-10-CM | POA: Insufficient documentation

## 2022-09-26 DIAGNOSIS — Z8546 Personal history of malignant neoplasm of prostate: Secondary | ICD-10-CM | POA: Insufficient documentation

## 2022-09-26 DIAGNOSIS — Z803 Family history of malignant neoplasm of breast: Secondary | ICD-10-CM | POA: Insufficient documentation

## 2022-09-26 DIAGNOSIS — E032 Hypothyroidism due to medicaments and other exogenous substances: Secondary | ICD-10-CM | POA: Diagnosis not present

## 2022-09-26 DIAGNOSIS — C3492 Malignant neoplasm of unspecified part of left bronchus or lung: Secondary | ICD-10-CM

## 2022-09-26 DIAGNOSIS — Z7952 Long term (current) use of systemic steroids: Secondary | ICD-10-CM | POA: Insufficient documentation

## 2022-09-26 LAB — CBC WITH DIFFERENTIAL/PLATELET
Abs Immature Granulocytes: 0.08 10*3/uL — ABNORMAL HIGH (ref 0.00–0.07)
Basophils Absolute: 0 10*3/uL (ref 0.0–0.1)
Basophils Relative: 0 %
Eosinophils Absolute: 0.1 10*3/uL (ref 0.0–0.5)
Eosinophils Relative: 1 %
HCT: 40.9 % (ref 39.0–52.0)
Hemoglobin: 13.1 g/dL (ref 13.0–17.0)
Immature Granulocytes: 1 %
Lymphocytes Relative: 11 %
Lymphs Abs: 0.9 10*3/uL (ref 0.7–4.0)
MCH: 29.4 pg (ref 26.0–34.0)
MCHC: 32 g/dL (ref 30.0–36.0)
MCV: 91.7 fL (ref 80.0–100.0)
Monocytes Absolute: 0.9 10*3/uL (ref 0.1–1.0)
Monocytes Relative: 12 %
Neutro Abs: 5.9 10*3/uL (ref 1.7–7.7)
Neutrophils Relative %: 75 %
Platelets: 166 10*3/uL (ref 150–400)
RBC: 4.46 MIL/uL (ref 4.22–5.81)
RDW: 14.6 % (ref 11.5–15.5)
WBC: 8 10*3/uL (ref 4.0–10.5)
nRBC: 0 % (ref 0.0–0.2)

## 2022-09-26 LAB — COMPREHENSIVE METABOLIC PANEL
ALT: 23 U/L (ref 0–44)
AST: 30 U/L (ref 15–41)
Albumin: 3.9 g/dL (ref 3.5–5.0)
Alkaline Phosphatase: 36 U/L — ABNORMAL LOW (ref 38–126)
Anion gap: 10 (ref 5–15)
BUN: 40 mg/dL — ABNORMAL HIGH (ref 8–23)
CO2: 25 mmol/L (ref 22–32)
Calcium: 9.2 mg/dL (ref 8.9–10.3)
Chloride: 98 mmol/L (ref 98–111)
Creatinine, Ser: 1.52 mg/dL — ABNORMAL HIGH (ref 0.61–1.24)
GFR, Estimated: 47 mL/min — ABNORMAL LOW (ref >60)
Glucose, Bld: 111 mg/dL — ABNORMAL HIGH (ref 70–99)
Potassium: 3.2 mmol/L — ABNORMAL LOW (ref 3.5–5.1)
Sodium: 133 mmol/L — ABNORMAL LOW (ref 135–145)
Total Bilirubin: 0.5 mg/dL (ref 0.3–1.2)
Total Protein: 6.8 g/dL (ref 6.5–8.1)

## 2022-09-26 NOTE — Progress Notes (Signed)
Hematology/Oncology Progress note [phone] [phone]     Reason for Visit:  Follow up for lung cancer    ASSESSMENT & PLAN:   Cancer Staging  Adenocarcinoma of left lung, stage 3 (HCC) Staging form: Lung, AJCC 8th Edition - Clinical stage from 02/14/2017: Stage IIIB (cT2b, cN3, cM0) - Signed by Rickard Patience, MD on 02/14/2017   Adenocarcinoma of left lung, stage 3 (HCC) #Stage IIIB lung adenocarcinoma, s/p concurrent chemo/RT and immunotherapy, finished all treatment in January 2020 Patient has been doing very well clinically, No CT evidence of cancer recurrence.  Continue annual CT chest abdomen pelvis without contrast - Oct 2024  MGUS (monoclonal gammopathy of unknown significance) Today's lab result is pending    History of prostate cancer # TURP procedure done on 02/14/2019.  Pathology showed prostate adenocarcinoma, Gleason score 3+3, tumor quantitation, estimated percentage of prostate tissue involved by tumor less than 5%.No perineural invasion identified. Recommend patient to follows up with urologist   Hypothyroidism due to medication Continue Synthroid. Follow up with endocrinologist.   Adrenal insufficiency (HCC) Continue prednisone Follow up with endocrinology   Orders Placed This Encounter  Procedures   CT CHEST ABDOMEN PELVIS WO CONTRAST    Standing Status:   Future    Standing Expiration Date:   09/26/2023    Order Specific Question:   Preferred imaging location?    Answer:   [facility]    Order Specific Question:   If indicated for the ordered procedure, I authorize the administration of oral contrast media per Radiology protocol    Answer:   Yes    Order Specific Question:   Does the patient have a contrast media/X-ray dye allergy?    Answer:   No   CBC with Differential (Cancer Center Only)    Standing Status:   Future    Standing Expiration Date:   09/26/2023   CMP (Cancer Center only)    Standing Status:   Future     Standing Expiration Date:   09/26/2023   Multiple Myeloma Panel (SPEP&IFE w/QIG)    Standing Status:   Future    Standing Expiration Date:   09/26/2023   Kappa/lambda light chains    Standing Status:   Future    Standing Expiration Date:   09/26/2023   Follow up in 6 months.   All questions were answered. The patient knows to call the clinic with any problems, questions or concerns.  Rickard Patience, MD, PhD [facility] Hematology Oncology 09/26/2022    HISTORY OF PRESENTING ILLNESS:  Marc Gray 77 y.o.  male who presents for treatment of Stage IIIB (cT2b, cN3, cM0)  Adenocarcinoma of lung.  # Patient is s/p [place] and taxol concurrent RT for treatment of Stage IIIB Lung cancer. He recieved additional lung boost RT finished one year of Durvalumab -January 2020  # Colon CA was listed in his history, however reviewing his surgical pathology from colonoscopy on 04/15/2014, during which he had polyps removed and all are negative for cancer.patient is scheduled for colonoscopy screening this year.  # 5/1/ 2019 CT scan was discussed at tumor board and changes consistent with radiation changes  # TURP procedure done on 02/14/2019.  Pathology showed prostate adenocarcinoma, Gleason score 3+3, tumor quantitation, estimated percentage of prostate tissue involved by tumor less than 5%. No perineural invasion identified. Patient follows up with urologist and was recommended to observe.  03/07/2021, CT without contrast showed unchanged posttreatment/postradiation fibrosis and consolidation of the perihilar left  lung and a paramedian left upper lobe.  Unchanged 4 mm nodules of the right lower lobe.  Almost certainly benign and incidental.  Emphysema.  Coronary artery disease.  Medi port was removed.   INTERVAL HISTORY Patient he presents for follow-up of fo stage IIIB adenocarcinoma of the lung. Patient follows up with endocrinology for adrenal insufficiency and hypothyroidism.   Patient   takes prednisone 10 mg in a.m., 2.5 mg in p.m.  No new complaints  Review of Systems  Constitutional:  Negative for appetite change, chills, fatigue, fever and unexpected weight change.  HENT:   Negative for hearing loss and voice change.   Eyes:  Negative for eye problems and icterus.  Respiratory:  Negative for chest tightness, cough and shortness of breath.   Cardiovascular:  Negative for chest pain and leg swelling.  Gastrointestinal:  Negative for abdominal distention, abdominal pain and blood in stool.  Endocrine: Negative for hot flashes.  Genitourinary:  Negative for difficulty urinating, dysuria and frequency.   Musculoskeletal:  Positive for arthralgias and back pain.  Skin:  Negative for itching and rash.  Neurological:  Negative for extremity weakness, light-headedness and numbness.  Hematological:  Negative for adenopathy. Does not bruise/bleed easily.  Psychiatric/Behavioral:  Negative for confusion.     MEDICAL HISTORY:  Past Medical History:  Diagnosis Date   Anxiety    Aortic atherosclerosis (HCC)    Arthritis    SHOULDER   BPH (benign prostatic hyperplasia)    Cataract    Chronic kidney disease    low kidney function   Colon adenomas    Colon cancer (HCC) 2015   Pt states during his colonoscopy he had cancerous polyps removed.    COPD (chronic obstructive pulmonary disease) (HCC)    Depression    SINCE DIAGNOSIS   Dyspnea    DOE   Enlarged prostate    GERD (gastroesophageal reflux disease)    Hypertension    Hypothyroidism    Lung cancer (HCC)    Aug 2018   Pain    DUE TO LUNG ISSUE    SURGICAL HISTORY: Past Surgical History:  Procedure Laterality Date   BACK SURGERY     kyphoplasty  T5-6   CATARACT EXTRACTION W/ INTRAOCULAR LENS  IMPLANT, BILATERAL     COLONOSCOPY     COLONOSCOPY WITH PROPOFOL N/A 12/17/2019   Procedure: COLONOSCOPY WITH PROPOFOL;  Surgeon: Earline Mayotte, MD;  Location: [facility];  Service: Endoscopy;  Laterality:  N/A;   EYE SURGERY Bilateral    cataract   LYMPH GLAND EXCISION N/A 02/09/2017   Procedure: CERVICAL LYMPH NODE BIOPSY;  Surgeon: Earline Mayotte, MD;  Location: [facility] ORS;  Service: General;  Laterality: N/A;   PORTACATH PLACEMENT Right 02/23/2017   Procedure: INSERTION PORT-A-CATH;  Surgeon: Earline Mayotte, MD;  Location: [facility] ORS;  Service: General;  Laterality: Right;   PROSTATE SURGERY     TONSILLECTOMY     TRANSURETHRAL RESECTION OF PROSTATE N/A 02/14/2019   Procedure: TRANSURETHRAL RESECTION OF THE PROSTATE (TURP);  Surgeon: Sondra Come, MD;  Location: [facility] ORS;  Service: Urology;  Laterality: N/A;    SOCIAL HISTORY: Social History   Socioeconomic History   Marital status: Married    Spouse name: Not on file   Number of children: Not on file   Years of education: Not on file   Highest education level: Not on file  Occupational History   Not on file  Tobacco Use   Smoking status: Former  Packs/day: 1.00    Years: 53.00    Additional pack years: 0.00    Total pack years: 53.00    Types: Cigarettes    Quit date: 04/14/2017    Years since quitting: 5.4    Passive exposure: Past   Smokeless tobacco: Never   Tobacco comments:    Quit November 2018  Vaping Use   Vaping Use: Never used  Substance and Sexual Activity   Alcohol use: Not Currently    Comment: occassional   Drug use: No   Sexual activity: Yes  Other Topics Concern   Not on file  Social History Narrative   Not on file   Social Determinants of Health   Financial Resource Strain: Not on file  Food Insecurity: Not on file  Transportation Needs: Not on file  Physical Activity: Not on file  Stress: Not on file  Social Connections: Not on file  Intimate Partner Violence: Not on file    FAMILY HISTORY: Family History  Problem Relation Age of Onset   Breast cancer Maternal Grandmother    Dementia Mother    Diabetes Father    Stroke Father     ALLERGIES:  is allergic to morphine and  related.  MEDICATIONS:  Current Outpatient Medications  Medication Sig Dispense Refill   acetaminophen (TYLENOL) 500 MG tablet Take 1,000 mg by mouth every 6 (six) hours as needed for moderate pain.     aspirin EC 81 MG tablet Take 1 tablet (81 mg total) by mouth daily. Swallow whole. 90 tablet 0   Azelastine HCl 137 MCG/SPRAY SOLN SMARTSIG:1-2 Spray(s) Both Nares Twice Daily     BREZTRI AEROSPHERE 160-9-4.8 MCG/ACT AERO Inhale 2 puffs into the lungs 2 (two) times daily.     calcium-vitamin D (OSCAL WITH D) 500-200 MG-UNIT tablet Take 1 tablet by mouth daily. 90 tablet 1   diphenhydrAMINE-zinc acetate (BENADRYL EXTRA STRENGTH) cream Apply 1 application topically 3 (three) times daily as needed for itching. 28 g 0   dupilumab (DUPIXENT) 200 MG/1. prefilled syringe Inject 200 mg into the skin every 14 (fourteen) days.     fluticasone (FLONASE) 50 MCG/ACT nasal spray Place 1 spray into both nostrils daily. 16 g 2   furosemide (LASIX) 20 MG tablet Take by mouth.     Ipratropium-Albuterol (COMBIVENT) 20-100 MCG/ACT AERS respimat Inhale into the lungs.     levothyroxine (SYNTHROID) 175 MCG tablet      lidocaine-prilocaine (EMLA) cream Apply over port 1-2 hours prior to chemotherapy treatment.  Cover with plastic wrap. 30 g 1   Melatonin 10 MG TABS Take 10 mg by mouth at bedtime.      montelukast (SINGULAIR) 10 MG tablet Take 10 mg by mouth daily.     Multiple Vitamin (MULTI-VITAMINS) TABS Take 1 tablet by mouth daily.      pantoprazole (PROTONIX) 40 MG tablet Take 1 tablet (40 mg total) by mouth daily. 90 tablet 0   predniSONE (DELTASONE) 5 MG tablet Take 5 mg by mouth daily.     rosuvastatin (CRESTOR) 10 MG tablet      sertraline (ZOLOFT) 25 MG tablet TAKE 1 TABLET(25 MG) BY MOUTH DAILY 90 tablet 0   tadalafil (CIALIS) 5 MG tablet Take 5-10mg  by mouth as needed prior to intercourse 30 tablet 11   tamsulosin (FLOMAX) 0.4 MG CAPS capsule Take 1 capsule (0.4 mg total) by mouth daily. 90 capsule  1   traZODone (DESYREL) 100 MG tablet Take 100 mg by mouth at bedtime.  triamcinolone ointment (KENALOG) 0.5 % Apply 1 application topically 3 (three) times daily as needed. 30 g 1   EPINEPHrine 0.3 mg/0.3 mL IJ SOAJ injection Inject 0.3 mg into the muscle as needed. (Patient not taking: Reported on 09/26/2022)     ondansetron (ZOFRAN) 8 MG tablet Take 1 tablet (8 mg total) by mouth every 12 (twelve) hours as needed for nausea or vomiting. (Patient not taking: Reported on 03/28/2022) 60 tablet 0   prochlorperazine (COMPAZINE) 10 MG tablet Take 1 tablet (10 mg total) by mouth every 6 (six) hours as needed for nausea or vomiting. (Patient not taking: Reported on 03/28/2022) 60 tablet 2   No current facility-administered medications for this visit.   Facility-Administered Medications Ordered in Other Visits  Medication Dose Route Frequency Provider Last Rate Last Admin   sodium chloride flush (NS) 0.9 % injection 10 mL  10 mL Intravenous PRN Rickard Patience, MD   10 mL at 04/03/17 1610      .  PHYSICAL EXAMINATION: ECOG PERFORMANCE STATUS: 1 - Symptomatic but completely ambulatory Vitals:   09/26/22 1007  BP: 130/78  Pulse: 78  Resp: 18  Temp: 97.7 F (36.5 C)  SpO2: 96%   Filed Weights   09/26/22 1007  Weight: 240 lb 12.8 oz (109.2 kg)  Physical Exam Constitutional:      General: He is not in acute distress.    Appearance: He is obese. He is not diaphoretic.  HENT:     Head: Normocephalic and atraumatic.  Eyes:     General: No scleral icterus.    Pupils: Pupils are equal, round, and reactive to light.  Cardiovascular:     Rate and Rhythm: Normal rate and regular rhythm.     Heart sounds: No murmur heard. Pulmonary:     Effort: Pulmonary effort is normal. No respiratory distress.     Breath sounds: No rales.  Chest:     Chest wall: No tenderness.  Abdominal:     General: There is no distension.     Palpations: Abdomen is soft.     Tenderness: There is no abdominal  tenderness.  Musculoskeletal:        General: Normal range of motion.     Cervical back: Normal range of motion and neck supple.  Skin:    General: Skin is warm and dry.  Neurological:     Mental Status: He is alert and oriented to person, place, and time. Mental status is at baseline.     Cranial Nerves: No cranial nerve deficit.     Motor: No abnormal muscle tone.  Psychiatric:        Mood and Affect: Mood and affect normal.    LABORATORY DATA:  I have reviewed the data as listed    Latest Ref Rng & Units 09/26/2022    9:38 AM 03/28/2022   10:02 AM 09/20/2021    9:54 AM  CBC  WBC 4.0 - 10.5 K/uL 8.0  5.5  8.1   Hemoglobin 13.0 - 17.0 g/dL 96.0  45.4  09.8   Hematocrit 39.0 - 52.0 % 40.9  39.9  41.5   Platelets 150 - 400 K/uL 166  135  140       Latest Ref Rng & Units 09/26/2022    9:38 AM 03/28/2022   10:02 AM 09/20/2021    9:54 AM  CMP  Glucose 70 - 99 mg/dL 119  147  829   BUN 8 - 23 mg/dL 40  22  26  Creatinine 0.61 - 1.24 mg/dL 2.13  0.86  5.78   Sodium 135 - 145 mmol/L 133  139  137   Potassium 3.5 - 5.1 mmol/L 3.2  3.3  3.4   Chloride 98 - 111 mmol/L 98  110  107   CO2 22 - 32 mmol/L 25  23  25    Calcium 8.9 - 10.3 mg/dL 9.2  8.1  8.4   Total Protein 6.5 - 8.1 g/dL 6.8  6.0  6.4   Total Bilirubin 0.3 - 1.2 mg/dL 0.5  0.7  0.4   Alkaline Phos 38 - 126 U/L 36  38  38   AST 15 - 41 U/L 30  31  22    ALT 0 - 44 U/L 23  25  21      RADIOGRAPHIC STUDIES: I have personally reviewed the radiological images as listed and agreed with the findings in the report. No results found.

## 2022-09-26 NOTE — Assessment & Plan Note (Signed)
Continue Synthroid. Follow up with endocrinologist.  

## 2022-09-26 NOTE — Assessment & Plan Note (Addendum)
#  Stage IIIB lung adenocarcinoma, s/p concurrent chemo/RT and immunotherapy, finished all treatment in January 2020 Patient has been doing very well clinically, No CT evidence of cancer recurrence.  Continue annual CT chest abdomen pelvis without contrast - Oct 2024

## 2022-09-26 NOTE — Assessment & Plan Note (Signed)
Continue prednisone Follow up with endocrinology

## 2022-09-26 NOTE — Assessment & Plan Note (Addendum)
Today's lab result is pending

## 2022-09-26 NOTE — Assessment & Plan Note (Addendum)
#   TURP procedure done on 02/14/2019.  Pathology showed prostate adenocarcinoma, Gleason score 3+3, tumor quantitation, estimated percentage of prostate tissue involved by tumor less than 5%.No perineural invasion identified. Recommend patient to follows up with urologist

## 2022-09-27 DIAGNOSIS — J8283 Eosinophilic asthma: Secondary | ICD-10-CM | POA: Diagnosis not present

## 2022-09-27 DIAGNOSIS — J4550 Severe persistent asthma, uncomplicated: Secondary | ICD-10-CM | POA: Diagnosis not present

## 2022-09-27 LAB — KAPPA/LAMBDA LIGHT CHAINS
Kappa free light chain: 52.2 mg/L — ABNORMAL HIGH (ref 3.3–19.4)
Kappa, lambda light chain ratio: 1.77 — ABNORMAL HIGH (ref 0.26–1.65)
Lambda free light chains: 29.5 mg/L — ABNORMAL HIGH (ref 5.7–26.3)

## 2022-10-02 LAB — MULTIPLE MYELOMA PANEL, SERUM
Albumin SerPl Elph-Mcnc: 3.7 g/dL (ref 2.9–4.4)
Albumin/Glob SerPl: 1.4 (ref 0.7–1.7)
Alpha 1: 0.2 g/dL (ref 0.0–0.4)
Alpha2 Glob SerPl Elph-Mcnc: 0.7 g/dL (ref 0.4–1.0)
B-Globulin SerPl Elph-Mcnc: 1 g/dL (ref 0.7–1.3)
Gamma Glob SerPl Elph-Mcnc: 0.8 g/dL (ref 0.4–1.8)
Globulin, Total: 2.7 g/dL (ref 2.2–3.9)
IgA: 229 mg/dL (ref 61–437)
IgG (Immunoglobin G), Serum: 751 mg/dL (ref 603–1613)
IgM (Immunoglobulin M), Srm: 77 mg/dL (ref 15–143)
M Protein SerPl Elph-Mcnc: 0.2 g/dL — ABNORMAL HIGH
Total Protein ELP: 6.4 g/dL (ref 6.0–8.5)

## 2022-10-11 DIAGNOSIS — J4550 Severe persistent asthma, uncomplicated: Secondary | ICD-10-CM | POA: Diagnosis not present

## 2022-10-11 DIAGNOSIS — J8283 Eosinophilic asthma: Secondary | ICD-10-CM | POA: Diagnosis not present

## 2022-10-20 DIAGNOSIS — G4733 Obstructive sleep apnea (adult) (pediatric): Secondary | ICD-10-CM | POA: Diagnosis not present

## 2022-10-26 DIAGNOSIS — J8283 Eosinophilic asthma: Secondary | ICD-10-CM | POA: Diagnosis not present

## 2022-10-26 DIAGNOSIS — J4540 Moderate persistent asthma, uncomplicated: Secondary | ICD-10-CM | POA: Diagnosis not present

## 2022-11-08 ENCOUNTER — Ambulatory Visit: Payer: PPO | Admitting: Urology

## 2022-11-08 DIAGNOSIS — J8283 Eosinophilic asthma: Secondary | ICD-10-CM | POA: Diagnosis not present

## 2022-11-08 DIAGNOSIS — J4540 Moderate persistent asthma, uncomplicated: Secondary | ICD-10-CM | POA: Diagnosis not present

## 2022-11-15 ENCOUNTER — Ambulatory Visit (INDEPENDENT_AMBULATORY_CARE_PROVIDER_SITE_OTHER): Payer: HMO | Admitting: Urology

## 2022-11-15 ENCOUNTER — Encounter: Payer: Self-pay | Admitting: Urology

## 2022-11-15 VITALS — BP 135/80 | HR 89 | Ht 73.0 in | Wt 245.0 lb

## 2022-11-15 DIAGNOSIS — N528 Other male erectile dysfunction: Secondary | ICD-10-CM | POA: Diagnosis not present

## 2022-11-15 DIAGNOSIS — N401 Enlarged prostate with lower urinary tract symptoms: Secondary | ICD-10-CM | POA: Diagnosis not present

## 2022-11-15 DIAGNOSIS — R3914 Feeling of incomplete bladder emptying: Secondary | ICD-10-CM | POA: Diagnosis not present

## 2022-11-15 DIAGNOSIS — C61 Malignant neoplasm of prostate: Secondary | ICD-10-CM

## 2022-11-15 LAB — BLADDER SCAN AMB NON-IMAGING

## 2022-11-15 MED ORDER — TAMSULOSIN HCL 0.4 MG PO CAPS: 0.4000 mg | ORAL_CAPSULE | Freq: Every day | ORAL | 3 refills | Status: DC

## 2022-11-15 MED ORDER — TADALAFIL 5 MG PO TABS: ORAL_TABLET | ORAL | 11 refills | Status: DC

## 2022-11-15 NOTE — Progress Notes (Signed)
   11/15/2022 12:24 PM   Marc Gray November 06, 1945 [id number]  Reason for visit: Follow up BPH, low risk prostate cancer diagnosed on TURP, CKD, ED  HPI: I saw [name] back in urology clinic for follow-up of BPH and low risk prostate cancer.  He is a 77 year old male with lung cancer with history of urinary retention and bilateral hydroureteronephrosis in 2013 treated with a TURP by Dr. Achilles Dunk.  I saw him in the fall 2020 for urinary symptoms and he was noted to have significantly elevated PVRs greater than 700 mL.  Cystoscopy showed some residual obstructive anterior prostate tissue and a massively distended bladder, and urodynamics showed high pressure voiding with bladder outlet obstruction.  He has had a known atrophic left kidney on recent renal ultrasound. He underwent a TURP with me on 02/14/2019, and tissue showed <5% involvement of low risk prostate cancer with 3+3=6 disease.  PSA postop has been stable and low, most recently 0.72 in June 2022, has not yet had a PSA since that time, we will repeat PSA again today.   Since most recent TURP has overall been voiding well.  He does have a history of elevated PVRs around that improved with double voiding/timed voiding.  PVR today again mildly elevated at , encouraged again to continue double voiding.  He reports some occasional pelvic pressure in the morning that improves after urination.  If worsening symptoms could always consider repeat cystoscopy to evaluate for regrowth of prostate tissue.  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  I personally reviewed and interpreted the recent CT chest abdomen pelvis from October 2023, this shows stable atrophic left kidney, and no right-sided hydronephrosis, wide open TURP defect.  Renal function is stable.  He continues to take Cialis 5 mg on demand for ED with good results, this medication was refilled.  Okay to increase to 10 to 20 mg as needed if decreased efficacy.    Plan to continue active surveillance for his low risk prostate cancer incidentally found at time of TURP with yearly PSA.  We discussed the very low risk of progression or problems from this in the future.  We discussed return precautions at length including gross hematuria, UTIs, worsening renal function, or incontinence.  PSA today for active surveillance of incidentally found low risk prostate cancer, call with results Cialis dose increased to 5 to 20 mg on demand Continue yearly follow-up for PVR, will order renal ultrasound at next follow-up to evaluate for any hydronephrosis  Sondra Come, MD  [facility] [address] [place], [place] [address] [phone]

## 2022-11-16 DIAGNOSIS — E2749 Other adrenocortical insufficiency: Secondary | ICD-10-CM | POA: Diagnosis not present

## 2022-11-16 DIAGNOSIS — E039 Hypothyroidism, unspecified: Secondary | ICD-10-CM | POA: Diagnosis not present

## 2022-11-20 DIAGNOSIS — G4733 Obstructive sleep apnea (adult) (pediatric): Secondary | ICD-10-CM | POA: Diagnosis not present

## 2022-11-21 ENCOUNTER — Other Ambulatory Visit: Payer: Self-pay

## 2022-11-21 DIAGNOSIS — Z86010 Personal history of colonic polyps: Secondary | ICD-10-CM | POA: Diagnosis not present

## 2022-11-21 DIAGNOSIS — Z09 Encounter for follow-up examination after completed treatment for conditions other than malignant neoplasm: Secondary | ICD-10-CM | POA: Diagnosis not present

## 2022-11-22 DIAGNOSIS — J8283 Eosinophilic asthma: Secondary | ICD-10-CM | POA: Diagnosis not present

## 2022-11-22 DIAGNOSIS — J4540 Moderate persistent asthma, uncomplicated: Secondary | ICD-10-CM | POA: Diagnosis not present

## 2022-11-23 DIAGNOSIS — E039 Hypothyroidism, unspecified: Secondary | ICD-10-CM | POA: Diagnosis not present

## 2022-11-23 DIAGNOSIS — E2749 Other adrenocortical insufficiency: Secondary | ICD-10-CM | POA: Diagnosis not present

## 2022-12-05 DIAGNOSIS — G4733 Obstructive sleep apnea (adult) (pediatric): Secondary | ICD-10-CM | POA: Diagnosis not present

## 2022-12-06 DIAGNOSIS — J4540 Moderate persistent asthma, uncomplicated: Secondary | ICD-10-CM | POA: Diagnosis not present

## 2022-12-06 DIAGNOSIS — J8283 Eosinophilic asthma: Secondary | ICD-10-CM | POA: Diagnosis not present

## 2022-12-14 DIAGNOSIS — J8283 Eosinophilic asthma: Secondary | ICD-10-CM | POA: Diagnosis not present

## 2022-12-14 DIAGNOSIS — J4540 Moderate persistent asthma, uncomplicated: Secondary | ICD-10-CM | POA: Diagnosis not present

## 2022-12-20 DIAGNOSIS — J4540 Moderate persistent asthma, uncomplicated: Secondary | ICD-10-CM | POA: Diagnosis not present

## 2022-12-20 DIAGNOSIS — J8283 Eosinophilic asthma: Secondary | ICD-10-CM | POA: Diagnosis not present

## 2022-12-20 DIAGNOSIS — G4733 Obstructive sleep apnea (adult) (pediatric): Secondary | ICD-10-CM | POA: Diagnosis not present

## 2022-12-27 ENCOUNTER — Emergency Department: Payer: HMO

## 2022-12-27 ENCOUNTER — Inpatient Hospital Stay
Admission: EM | Admit: 2022-12-27 | Discharge: 2022-12-30 | DRG: 322 | Disposition: A | Discharge status: Home / Self Care | Payer: HMO | Attending: Hospitalist | Admitting: Hospitalist

## 2022-12-27 ENCOUNTER — Other Ambulatory Visit: Payer: Self-pay

## 2022-12-27 DIAGNOSIS — Z1152 Encounter for screening for COVID-19: Secondary | ICD-10-CM

## 2022-12-27 DIAGNOSIS — R079 Chest pain, unspecified: Secondary | ICD-10-CM | POA: Diagnosis not present

## 2022-12-27 DIAGNOSIS — Z8249 Family history of ischemic heart disease and other diseases of the circulatory system: Secondary | ICD-10-CM

## 2022-12-27 DIAGNOSIS — I214 Non-ST elevation (NSTEMI) myocardial infarction: Secondary | ICD-10-CM | POA: Diagnosis not present

## 2022-12-27 DIAGNOSIS — Z9221 Personal history of antineoplastic chemotherapy: Secondary | ICD-10-CM

## 2022-12-27 DIAGNOSIS — Z85038 Personal history of other malignant neoplasm of large intestine: Secondary | ICD-10-CM

## 2022-12-27 DIAGNOSIS — I700 Atherosclerosis of aorta: Secondary | ICD-10-CM | POA: Diagnosis present

## 2022-12-27 DIAGNOSIS — Z8546 Personal history of malignant neoplasm of prostate: Secondary | ICD-10-CM

## 2022-12-27 DIAGNOSIS — Z885 Allergy status to narcotic agent status: Secondary | ICD-10-CM

## 2022-12-27 DIAGNOSIS — Z7982 Long term (current) use of aspirin: Secondary | ICD-10-CM

## 2022-12-27 DIAGNOSIS — E2740 Unspecified adrenocortical insufficiency: Secondary | ICD-10-CM | POA: Diagnosis present

## 2022-12-27 DIAGNOSIS — Z7952 Long term (current) use of systemic steroids: Secondary | ICD-10-CM

## 2022-12-27 DIAGNOSIS — Z85118 Personal history of other malignant neoplasm of bronchus and lung: Secondary | ICD-10-CM

## 2022-12-27 DIAGNOSIS — D472 Monoclonal gammopathy: Secondary | ICD-10-CM | POA: Diagnosis present

## 2022-12-27 DIAGNOSIS — Z961 Presence of intraocular lens: Secondary | ICD-10-CM | POA: Diagnosis present

## 2022-12-27 DIAGNOSIS — N400 Enlarged prostate without lower urinary tract symptoms: Secondary | ICD-10-CM | POA: Diagnosis present

## 2022-12-27 DIAGNOSIS — E032 Hypothyroidism due to medicaments and other exogenous substances: Secondary | ICD-10-CM | POA: Diagnosis present

## 2022-12-27 DIAGNOSIS — K219 Gastro-esophageal reflux disease without esophagitis: Secondary | ICD-10-CM | POA: Diagnosis present

## 2022-12-27 DIAGNOSIS — I129 Hypertensive chronic kidney disease with stage 1 through stage 4 chronic kidney disease, or unspecified chronic kidney disease: Secondary | ICD-10-CM | POA: Diagnosis present

## 2022-12-27 DIAGNOSIS — Z923 Personal history of irradiation: Secondary | ICD-10-CM

## 2022-12-27 DIAGNOSIS — Z79899 Other long term (current) drug therapy: Secondary | ICD-10-CM

## 2022-12-27 DIAGNOSIS — N1832 Chronic kidney disease, stage 3b: Secondary | ICD-10-CM | POA: Diagnosis present

## 2022-12-27 DIAGNOSIS — J4489 Other specified chronic obstructive pulmonary disease: Secondary | ICD-10-CM | POA: Diagnosis present

## 2022-12-27 DIAGNOSIS — I2510 Atherosclerotic heart disease of native coronary artery without angina pectoris: Secondary | ICD-10-CM | POA: Diagnosis present

## 2022-12-27 DIAGNOSIS — E785 Hyperlipidemia, unspecified: Secondary | ICD-10-CM | POA: Diagnosis present

## 2022-12-27 DIAGNOSIS — Z79890 Hormone replacement therapy (postmenopausal): Secondary | ICD-10-CM

## 2022-12-27 DIAGNOSIS — N1831 Chronic kidney disease, stage 3a: Secondary | ICD-10-CM | POA: Insufficient documentation

## 2022-12-27 DIAGNOSIS — Z9841 Cataract extraction status, right eye: Secondary | ICD-10-CM

## 2022-12-27 DIAGNOSIS — I440 Atrioventricular block, first degree: Secondary | ICD-10-CM | POA: Diagnosis present

## 2022-12-27 DIAGNOSIS — Z9842 Cataract extraction status, left eye: Secondary | ICD-10-CM

## 2022-12-27 DIAGNOSIS — C3492 Malignant neoplasm of unspecified part of left bronchus or lung: Secondary | ICD-10-CM | POA: Diagnosis present

## 2022-12-27 DIAGNOSIS — Z87891 Personal history of nicotine dependence: Secondary | ICD-10-CM

## 2022-12-27 DIAGNOSIS — R0602 Shortness of breath: Secondary | ICD-10-CM

## 2022-12-27 DIAGNOSIS — Z9079 Acquired absence of other genital organ(s): Secondary | ICD-10-CM

## 2022-12-27 LAB — COMPREHENSIVE METABOLIC PANEL
ALT: 28 U/L (ref 0–44)
AST: 27 U/L (ref 15–41)
Albumin: 3.9 g/dL (ref 3.5–5.0)
Alkaline Phosphatase: 37 U/L — ABNORMAL LOW (ref 38–126)
Anion gap: 7 (ref 5–15)
BUN: 46 mg/dL — ABNORMAL HIGH (ref 8–23)
CO2: 23 mmol/L (ref 22–32)
Calcium: 8.6 mg/dL — ABNORMAL LOW (ref 8.9–10.3)
Chloride: 107 mmol/L (ref 98–111)
Creatinine, Ser: 1.86 mg/dL — ABNORMAL HIGH (ref 0.61–1.24)
GFR, Estimated: 37 mL/min — ABNORMAL LOW (ref >60)
Glucose, Bld: 127 mg/dL — ABNORMAL HIGH (ref 70–99)
Potassium: 4.3 mmol/L (ref 3.5–5.1)
Sodium: 137 mmol/L (ref 135–145)
Total Bilirubin: 0.5 mg/dL (ref 0.3–1.2)
Total Protein: 6.8 g/dL (ref 6.5–8.1)

## 2022-12-27 LAB — CBC WITH DIFFERENTIAL/PLATELET
Abs Immature Granulocytes: 0.07 10*3/uL (ref 0.00–0.07)
Basophils Absolute: 0 10*3/uL (ref 0.0–0.1)
Basophils Relative: 0 %
Eosinophils Absolute: 0 10*3/uL (ref 0.0–0.5)
Eosinophils Relative: 0 %
HCT: 35.6 % — ABNORMAL LOW (ref 39.0–52.0)
Hemoglobin: 11.4 g/dL — ABNORMAL LOW (ref 13.0–17.0)
Immature Granulocytes: 1 %
Lymphocytes Relative: 10 %
Lymphs Abs: 0.8 10*3/uL (ref 0.7–4.0)
MCH: 27.3 pg (ref 26.0–34.0)
MCHC: 32 g/dL (ref 30.0–36.0)
MCV: 85.4 fL (ref 80.0–100.0)
Monocytes Absolute: 0.8 10*3/uL (ref 0.1–1.0)
Monocytes Relative: 10 %
Neutro Abs: 6.4 10*3/uL (ref 1.7–7.7)
Neutrophils Relative %: 79 %
Platelets: 197 10*3/uL (ref 150–400)
RBC: 4.17 MIL/uL — ABNORMAL LOW (ref 4.22–5.81)
RDW: 16.7 % — ABNORMAL HIGH (ref 11.5–15.5)
WBC: 8 10*3/uL (ref 4.0–10.5)
nRBC: 0 % (ref 0.0–0.2)

## 2022-12-27 LAB — TROPONIN I (HIGH SENSITIVITY): Troponin I (High Sensitivity): 24 ng/L — ABNORMAL HIGH (ref <18)

## 2022-12-27 NOTE — ED Triage Notes (Signed)
Pt presents to ER with c/o sob and chest pain.  Pt states he has been feeling sob for last few weeks, and states his chest started to hurt appx 20-30 minutes ago.  Pt reports hx of lung cancer, but states he is not currently on chemo.  Pt states chest pain is in center chest and radiates into left side.  States pain feels like a burning sensation.  Pt is otherwise A&O x4 and in NAD in triage.

## 2022-12-28 ENCOUNTER — Inpatient Hospital Stay
Admit: 2022-12-28 | Discharge: 2022-12-28 | Disposition: A | Discharge status: Home / Self Care | Payer: HMO | Attending: Cardiology | Admitting: Cardiology

## 2022-12-28 ENCOUNTER — Other Ambulatory Visit: Payer: Self-pay

## 2022-12-28 DIAGNOSIS — J4489 Other specified chronic obstructive pulmonary disease: Secondary | ICD-10-CM | POA: Diagnosis not present

## 2022-12-28 DIAGNOSIS — Z85038 Personal history of other malignant neoplasm of large intestine: Secondary | ICD-10-CM | POA: Diagnosis not present

## 2022-12-28 DIAGNOSIS — I700 Atherosclerosis of aorta: Secondary | ICD-10-CM | POA: Diagnosis not present

## 2022-12-28 DIAGNOSIS — R0600 Dyspnea, unspecified: Secondary | ICD-10-CM | POA: Diagnosis not present

## 2022-12-28 DIAGNOSIS — Z8546 Personal history of malignant neoplasm of prostate: Secondary | ICD-10-CM | POA: Diagnosis not present

## 2022-12-28 DIAGNOSIS — R0602 Shortness of breath: Secondary | ICD-10-CM | POA: Diagnosis not present

## 2022-12-28 DIAGNOSIS — R079 Chest pain, unspecified: Secondary | ICD-10-CM | POA: Diagnosis not present

## 2022-12-28 DIAGNOSIS — J90 Pleural effusion, not elsewhere classified: Secondary | ICD-10-CM | POA: Diagnosis not present

## 2022-12-28 DIAGNOSIS — I214 Non-ST elevation (NSTEMI) myocardial infarction: Principal | ICD-10-CM

## 2022-12-28 DIAGNOSIS — Z961 Presence of intraocular lens: Secondary | ICD-10-CM | POA: Diagnosis not present

## 2022-12-28 DIAGNOSIS — I2119 ST elevation (STEMI) myocardial infarction involving other coronary artery of inferior wall: Secondary | ICD-10-CM | POA: Diagnosis not present

## 2022-12-28 DIAGNOSIS — N400 Enlarged prostate without lower urinary tract symptoms: Secondary | ICD-10-CM | POA: Diagnosis not present

## 2022-12-28 DIAGNOSIS — Z79890 Hormone replacement therapy (postmenopausal): Secondary | ICD-10-CM | POA: Diagnosis not present

## 2022-12-28 DIAGNOSIS — N1832 Chronic kidney disease, stage 3b: Secondary | ICD-10-CM | POA: Diagnosis not present

## 2022-12-28 DIAGNOSIS — Z87891 Personal history of nicotine dependence: Secondary | ICD-10-CM | POA: Diagnosis not present

## 2022-12-28 DIAGNOSIS — Z7952 Long term (current) use of systemic steroids: Secondary | ICD-10-CM | POA: Diagnosis not present

## 2022-12-28 DIAGNOSIS — E785 Hyperlipidemia, unspecified: Secondary | ICD-10-CM | POA: Diagnosis not present

## 2022-12-28 DIAGNOSIS — Z79899 Other long term (current) drug therapy: Secondary | ICD-10-CM | POA: Diagnosis not present

## 2022-12-28 DIAGNOSIS — E032 Hypothyroidism due to medicaments and other exogenous substances: Secondary | ICD-10-CM | POA: Diagnosis not present

## 2022-12-28 DIAGNOSIS — Z1152 Encounter for screening for COVID-19: Secondary | ICD-10-CM | POA: Diagnosis not present

## 2022-12-28 DIAGNOSIS — D472 Monoclonal gammopathy: Secondary | ICD-10-CM | POA: Diagnosis not present

## 2022-12-28 DIAGNOSIS — I129 Hypertensive chronic kidney disease with stage 1 through stage 4 chronic kidney disease, or unspecified chronic kidney disease: Secondary | ICD-10-CM | POA: Diagnosis not present

## 2022-12-28 DIAGNOSIS — Z7982 Long term (current) use of aspirin: Secondary | ICD-10-CM | POA: Diagnosis not present

## 2022-12-28 DIAGNOSIS — E2740 Unspecified adrenocortical insufficiency: Secondary | ICD-10-CM | POA: Diagnosis not present

## 2022-12-28 DIAGNOSIS — K219 Gastro-esophageal reflux disease without esophagitis: Secondary | ICD-10-CM | POA: Diagnosis not present

## 2022-12-28 DIAGNOSIS — R918 Other nonspecific abnormal finding of lung field: Secondary | ICD-10-CM | POA: Diagnosis not present

## 2022-12-28 DIAGNOSIS — Z9221 Personal history of antineoplastic chemotherapy: Secondary | ICD-10-CM | POA: Diagnosis not present

## 2022-12-28 DIAGNOSIS — I25110 Atherosclerotic heart disease of native coronary artery with unstable angina pectoris: Secondary | ICD-10-CM | POA: Diagnosis not present

## 2022-12-28 DIAGNOSIS — N1831 Chronic kidney disease, stage 3a: Secondary | ICD-10-CM | POA: Insufficient documentation

## 2022-12-28 DIAGNOSIS — Z8249 Family history of ischemic heart disease and other diseases of the circulatory system: Secondary | ICD-10-CM | POA: Diagnosis not present

## 2022-12-28 DIAGNOSIS — I440 Atrioventricular block, first degree: Secondary | ICD-10-CM | POA: Diagnosis not present

## 2022-12-28 DIAGNOSIS — I2510 Atherosclerotic heart disease of native coronary artery without angina pectoris: Secondary | ICD-10-CM | POA: Diagnosis not present

## 2022-12-28 LAB — HEPARIN LEVEL (UNFRACTIONATED)
Heparin Unfractionated: 0.26 IU/mL — ABNORMAL LOW (ref 0.30–0.70)
Heparin Unfractionated: 0.48 IU/mL (ref 0.30–0.70)

## 2022-12-28 LAB — LIPID PANEL
Cholesterol: 128 mg/dL (ref 0–200)
HDL: 54 mg/dL (ref >40)
LDL Cholesterol: 59 mg/dL (ref 0–99)
Total CHOL/HDL Ratio: 2.4 RATIO
Triglycerides: 74 mg/dL (ref <150)
VLDL: 15 mg/dL (ref 0–40)

## 2022-12-28 LAB — BASIC METABOLIC PANEL
Anion gap: 10 (ref 5–15)
BUN: 41 mg/dL — ABNORMAL HIGH (ref 8–23)
CO2: 23 mmol/L (ref 22–32)
Calcium: 8.3 mg/dL — ABNORMAL LOW (ref 8.9–10.3)
Chloride: 106 mmol/L (ref 98–111)
Creatinine, Ser: 1.46 mg/dL — ABNORMAL HIGH (ref 0.61–1.24)
GFR, Estimated: 50 mL/min — ABNORMAL LOW (ref >60)
Glucose, Bld: 103 mg/dL — ABNORMAL HIGH (ref 70–99)
Potassium: 4 mmol/L (ref 3.5–5.1)
Sodium: 139 mmol/L (ref 135–145)

## 2022-12-28 LAB — ECHOCARDIOGRAM COMPLETE
AR max vel: 2.65 cm2
AV Area VTI: 2.6 cm2
AV Area mean vel: 2.88 cm2
AV Mean grad: 3 mmHg
AV Peak grad: 7.3 mmHg
Ao pk vel: 1.35 m/s
Area-P 1/2: 3.91 cm2
Calc EF: 55.8 %
Height: 73 in
MV VTI: 1.97 cm2
S' Lateral: 2.7 cm
Single Plane A2C EF: 56.3 %
Single Plane A4C EF: 57 %
Weight: 3920 oz

## 2022-12-28 LAB — PROTIME-INR
INR: 1.1 (ref 0.8–1.2)
Prothrombin Time: 14.1 seconds (ref 11.4–15.2)

## 2022-12-28 LAB — TROPONIN I (HIGH SENSITIVITY)
Troponin I (High Sensitivity): 142 ng/L (ref <18)
Troponin I (High Sensitivity): 105 ng/L (ref <18)
Troponin I (High Sensitivity): 89 ng/L — ABNORMAL HIGH (ref <18)
Troponin I (High Sensitivity): 81 ng/L — ABNORMAL HIGH (ref <18)

## 2022-12-28 LAB — APTT: aPTT: 30 seconds (ref 24–36)

## 2022-12-28 LAB — BRAIN NATRIURETIC PEPTIDE: B Natriuretic Peptide: 152.9 pg/mL — ABNORMAL HIGH (ref 0.0–100.0)

## 2022-12-28 LAB — SARS CORONAVIRUS 2 BY RT PCR: SARS Coronavirus 2 by RT PCR: NEGATIVE

## 2022-12-28 LAB — D-DIMER, QUANTITATIVE: D-Dimer, Quant: 0.95 ug/mL-FEU — ABNORMAL HIGH (ref 0.00–0.50)

## 2022-12-28 MED ORDER — HEPARIN BOLUS VIA INFUSION
4000.0000 [IU] | Freq: Once | INTRAVENOUS | Status: AC
  Administered 2022-12-28: 4000 [IU] via INTRAVENOUS
  Filled 2022-12-28: qty 4000

## 2022-12-28 MED ORDER — TAMSULOSIN HCL 0.4 MG PO CAPS
0.4000 mg | ORAL_CAPSULE | Freq: Every day | ORAL | Status: DC
  Administered 2022-12-28 – 2022-12-30 (×3): 0.4 mg via ORAL
  Filled 2022-12-28 (×3): qty 1

## 2022-12-28 MED ORDER — ONDANSETRON HCL 4 MG/2ML IJ SOLN: 4.0000 mg | Freq: Four times a day (QID) | INTRAMUSCULAR | Status: DC | PRN

## 2022-12-28 MED ORDER — SODIUM CHLORIDE 0.9 % WEIGHT BASED INFUSION
3.0000 mL/kg/h | INTRAVENOUS | Status: AC
  Administered 2022-12-29: 3 mL/kg/h via INTRAVENOUS

## 2022-12-28 MED ORDER — LEVOTHYROXINE SODIUM 50 MCG PO TABS
150.0000 ug | ORAL_TABLET | Freq: Every day | ORAL | Status: DC
  Administered 2022-12-29 – 2022-12-30 (×2): 150 ug via ORAL
  Filled 2022-12-28 (×2): qty 1

## 2022-12-28 MED ORDER — METOPROLOL TARTRATE 25 MG PO TABS
12.5000 mg | ORAL_TABLET | Freq: Two times a day (BID) | ORAL | Status: DC
  Administered 2022-12-28 – 2022-12-30 (×5): 12.5 mg via ORAL
  Filled 2022-12-28 (×5): qty 1

## 2022-12-28 MED ORDER — SODIUM CHLORIDE 0.9 % WEIGHT BASED INFUSION
1.0000 mL/kg/h | INTRAVENOUS | Status: DC
  Administered 2022-12-29: 1 mL/kg/h via INTRAVENOUS

## 2022-12-28 MED ORDER — BUDESON-GLYCOPYRROL-FORMOTEROL 160-9-4.8 MCG/ACT IN AERO: 2.0000 | INHALATION_SPRAY | Freq: Two times a day (BID) | RESPIRATORY_TRACT | Status: DC

## 2022-12-28 MED ORDER — LEVOTHYROXINE SODIUM 50 MCG PO TABS
175.0000 ug | ORAL_TABLET | Freq: Every day | ORAL | Status: DC
  Administered 2022-12-28: 175 ug via ORAL
  Filled 2022-12-28: qty 4

## 2022-12-28 MED ORDER — MOMETASONE FURO-FORMOTEROL FUM 100-5 MCG/ACT IN AERO
2.0000 | INHALATION_SPRAY | Freq: Two times a day (BID) | RESPIRATORY_TRACT | Status: DC
  Administered 2022-12-28 – 2022-12-30 (×4): 2 via RESPIRATORY_TRACT
  Filled 2022-12-28: qty 8.8

## 2022-12-28 MED ORDER — NITROGLYCERIN 0.4 MG SL SUBL: 0.4000 mg | SUBLINGUAL_TABLET | SUBLINGUAL | Status: DC | PRN

## 2022-12-28 MED ORDER — ATORVASTATIN CALCIUM 20 MG PO TABS
40.0000 mg | ORAL_TABLET | Freq: Every day | ORAL | Status: DC
  Administered 2022-12-28 – 2022-12-29 (×2): 40 mg via ORAL
  Filled 2022-12-28 (×2): qty 2

## 2022-12-28 MED ORDER — ACETAMINOPHEN 325 MG PO TABS
650.0000 mg | ORAL_TABLET | ORAL | Status: DC | PRN
  Administered 2022-12-28 – 2022-12-29 (×2): 650 mg via ORAL
  Filled 2022-12-28 (×2): qty 2

## 2022-12-28 MED ORDER — ASPIRIN 81 MG PO TBEC: 81.0000 mg | DELAYED_RELEASE_TABLET | Freq: Every day | ORAL | Status: DC

## 2022-12-28 MED ORDER — TIOTROPIUM BROMIDE MONOHYDRATE 18 MCG IN CAPS
18.0000 ug | ORAL_CAPSULE | Freq: Every day | RESPIRATORY_TRACT | Status: DC
  Administered 2022-12-28 – 2022-12-30 (×2): 18 ug via RESPIRATORY_TRACT
  Filled 2022-12-28: qty 5

## 2022-12-28 MED ORDER — HEPARIN (PORCINE) 25000 UT/250ML-% IV SOLN
1550.0000 [IU]/h | INTRAVENOUS | Status: DC
  Administered 2022-12-28: 1550 [IU]/h via INTRAVENOUS
  Administered 2022-12-28: 1400 [IU]/h via INTRAVENOUS
  Filled 2022-12-28 (×2): qty 250

## 2022-12-28 MED ORDER — ASPIRIN 81 MG PO CHEW
324.0000 mg | CHEWABLE_TABLET | Freq: Once | ORAL | Status: AC
  Administered 2022-12-28: 324 mg via ORAL
  Filled 2022-12-28: qty 4

## 2022-12-28 MED ORDER — ASPIRIN 81 MG PO CHEW
81.0000 mg | CHEWABLE_TABLET | ORAL | Status: AC
  Administered 2022-12-29: 81 mg via ORAL
  Filled 2022-12-28: qty 1

## 2022-12-28 MED ORDER — PANTOPRAZOLE SODIUM 40 MG PO TBEC
40.0000 mg | DELAYED_RELEASE_TABLET | Freq: Every day | ORAL | Status: DC
  Administered 2022-12-28 – 2022-12-30 (×3): 40 mg via ORAL
  Filled 2022-12-28 (×3): qty 1

## 2022-12-28 MED ORDER — HEPARIN BOLUS VIA INFUSION
1500.0000 [IU] | Freq: Once | INTRAVENOUS | Status: AC
  Administered 2022-12-28: 1500 [IU] via INTRAVENOUS
  Filled 2022-12-28: qty 1500

## 2022-12-28 MED ORDER — ASPIRIN 81 MG PO TBEC
81.0000 mg | DELAYED_RELEASE_TABLET | Freq: Every day | ORAL | Status: DC
  Administered 2022-12-28: 81 mg via ORAL
  Filled 2022-12-28: qty 1

## 2022-12-28 NOTE — ED Notes (Signed)
Delay in transporting patient to his inpatient room due to being in the middle of an Echocardiogram.

## 2022-12-28 NOTE — Plan of Care (Signed)
  Problem: Cardiac: Goal: Ability to achieve and maintain adequate cardiovascular perfusion will improve Outcome: Progressing   Problem: Clinical Measurements: Goal: Ability to maintain clinical measurements within normal limits will improve Outcome: Progressing Goal: Diagnostic test results will improve Outcome: Progressing Goal: Cardiovascular complication will be avoided Outcome: Progressing   Problem: Safety: Goal: Ability to remain free from injury will improve Outcome: Progressing

## 2022-12-28 NOTE — Progress Notes (Signed)
Chap visited Pt during routine rounding. Pt shared the support of his wife and medical staff and his appreciation of them. Chap supplied a calming presence and reflective listening.    12/28/22 1100  Spiritual Encounters  Type of Visit Initial  Care provided to: Patient  Referral source Chaplain assessment  Reason for visit Routine spiritual support  OnCall Visit No  Spiritual Framework  Presenting Themes Goals in life/care  Community/Connection Family  Interventions  Spiritual Care Interventions Made Compassionate presence;Reflective listening;Narrative/life review  Intervention Outcomes  Outcomes Reduced isolation;Awareness of support;Connection to spiritual care  Spiritual Care Plan  Spiritual Care Issues Still Outstanding No further spiritual care needs at this time (see row info)

## 2022-12-28 NOTE — ED Provider Notes (Signed)
[facility] Provider Note    Event Date/Time   First MD Initiated Contact with Patient 12/28/22 0007     (approximate)   History   Shortness of Breath and Chest Pain   HPI  Marc Gray is a 77 y.o. male  with h/o HTN, CKD, COPD/asthma, here with CP and SOB. Pt reports increasingly severe DOE over last few weeks, now with significant dull, aching, pressure-like chest pressure with exertion.  It is now present with even minimal amount of movement or exertion.  He states the pain is an aching sensation in his chest.  No radiation.  He does occasionally have some discomfort in his arms.  Symptoms feel worse than his typical asthma/COPD so he presents for evaluation.  Is been using his inhaler without relief.     Physical Exam   Triage Vital Signs: ED Triage Vitals [12/27/22 2200]  Encounter Vitals Group     BP 120/74     Systolic BP Percentile      Diastolic BP Percentile      Pulse Rate 100     Resp 20     Temp      Temp Source Oral     SpO2 93 %     Weight 245 lb (111.1 kg)     Height 6\' 1"  (1.854 m)     Head Circumference      Peak Flow      Pain Score 8     Pain Loc      Pain Education      Exclude from Growth Chart     Most recent vital signs: Vitals:   12/28/22 0700 12/28/22 0730  BP: (!) 151/87 (!) 150/88  Pulse: (!) 57 (!) 56  Resp: 16 18  Temp:    SpO2: 98% 97%     General: Awake, no distress.   CV:  Good peripheral perfusion. RRR. No m/r/g. Resp:  Normal work of breathing. Scant expiratory wheezes.3 Abd:  No distention. No ttp. Other:  No le edema.   ED Results / Procedures / Treatments   Labs (all labs ordered are listed, but only abnormal results are displayed) Labs Reviewed  CBC WITH DIFFERENTIAL/PLATELET - Abnormal; Notable for the following components:      Result Value   RBC 4.17 (*)    Hemoglobin 11.4 (*)    HCT 35.6 (*)    RDW 16.7 (*)    All other components within normal limits   COMPREHENSIVE METABOLIC PANEL - Abnormal; Notable for the following components:   Glucose, Bld 127 (*)    BUN 46 (*)    Creatinine, Ser 1.86 (*)    Calcium 8.6 (*)    Alkaline Phosphatase 37 (*)    GFR, Estimated 37 (*)    All other components within normal limits  BRAIN NATRIURETIC PEPTIDE - Abnormal; Notable for the following components:   B Natriuretic Peptide 152.9 (*)    All other components within normal limits  D-DIMER, QUANTITATIVE - Abnormal; Notable for the following components:   D-Dimer, Quant 0.95 (*)    All other components within normal limits  BASIC METABOLIC PANEL - Abnormal; Notable for the following components:   Glucose, Bld 103 (*)    BUN 41 (*)    Creatinine, Ser 1.46 (*)    Calcium 8.3 (*)    GFR, Estimated 50 (*)    All other components within normal limits  TROPONIN I (HIGH SENSITIVITY) - Abnormal; Notable for  the following components:   Troponin I (High Sensitivity) 24 (*)    All other components within normal limits  TROPONIN I (HIGH SENSITIVITY) - Abnormal; Notable for the following components:   Troponin I (High Sensitivity) 100 (*)    All other components within normal limits  TROPONIN I (HIGH SENSITIVITY) - Abnormal; Notable for the following components:   Troponin I (High Sensitivity) 142 (*)    All other components within normal limits  SARS CORONAVIRUS 2 BY RT PCR  APTT  PROTIME-INR  LIPOPROTEIN A (LPA)  HEPARIN LEVEL (UNFRACTIONATED)     EKG Normal sinus rhythm, VR 98. PR 236, QRS 92, QTc 436. No acute ST elevations or depressions. No ischemia or infarct.   RADIOLOGY CXR: No active disease   I also independently reviewed and agree with radiologist interpretations.   PROCEDURES:  Critical Care performed: Yes, see critical care procedure note(s)  .Critical Care  Performed by: Shaune Pollack, MD Authorized by: Shaune Pollack, MD   Critical care provider statement:    Critical care time (minutes):  30   Critical care time  was exclusive of:  Separately billable procedures and treating other patients   Critical care was necessary to treat or prevent imminent or life-threatening deterioration of the following conditions:  Cardiac failure, circulatory failure and respiratory failure   Critical care was time spent personally by me on the following activities:  Development of treatment plan with patient or surrogate, discussions with consultants, evaluation of patient's response to treatment, examination of patient, ordering and review of laboratory studies, ordering and review of radiographic studies, ordering and performing treatments and interventions, pulse oximetry, re-evaluation of patient's condition and review of old charts     MEDICATIONS ORDERED IN ED: Medications  heparin ADULT infusion 100 units/mL (25000 units/275mL) (1,400 Units/hr Intravenous New Bag/Given 12/28/22 0320)  aspirin EC tablet 81 mg (has no administration in time range)  levothyroxine (SYNTHROID) tablet 175 mcg (175 mcg Oral Given 12/28/22 0619)  pantoprazole (PROTONIX) EC tablet 40 mg (has no administration in time range)  tamsulosin (FLOMAX) capsule 0.4 mg (has no administration in time range)  nitroGLYCERIN (NITROSTAT) SL tablet 0.4 mg (has no administration in time range)  acetaminophen (TYLENOL) tablet 650 mg (650 mg Oral Given 12/28/22 0357)  ondansetron (ZOFRAN) injection 4 mg (has no administration in time range)  metoprolol tartrate (LOPRESSOR) tablet 12.5 mg (12.5 mg Oral Given 12/28/22 0357)  mometasone-formoterol (DULERA) 100-5 MCG/ACT inhaler 2 puff (has no administration in time range)  tiotropium (SPIRIVA) inhalation capsule ([facility] use ONLY) 18 mcg (has no administration in time range)  aspirin chewable tablet 324 mg (324 mg Oral Given 12/28/22 0045)  heparin bolus via infusion 4,000 Units (4,000 Units Intravenous Bolus from Bag 12/28/22 0318)     IMPRESSION / MDM / ASSESSMENT AND PLAN / ED COURSE  I reviewed the triage vital signs  and the nursing notes.                              Differential diagnosis includes, but is not limited to, ACS, asthma/bronchospasm, CHF, anemia, deconditoining  Patient's presentation is most consistent with acute presentation with potential threat to life or bodily function.  The patient is on the cardiac monitor to evaluate for evidence of arrhythmia and/or significant heart rate changes  77 yo M with PMHx HTN, asthma/COPD, here with worsening DOE. EKG nonischemic, but sx concerning for unstable angina. Trop 24-->100. CBC unremarkable. CMP with  mild elevation in baseline CKD. Pt has no CP at rest currently. ASA given and pt started on heparin gtt. Will admit to medicine. CXR clear.     FINAL CLINICAL IMPRESSION(S) / ED DIAGNOSES   Final diagnoses:  NSTEMI (non-ST elevated myocardial infarction) (HCC)  Shortness of breath     Rx / DC Orders   ED Discharge Orders     None        Note:  This document was prepared using Dragon voice recognition software and may include unintentional dictation errors.   Shaune Pollack, MD 12/28/22 [phone]

## 2022-12-28 NOTE — Assessment & Plan Note (Addendum)
Patient presents with chest pain Continue heparin infusion Aspirin, rosuvastatin Cardiology consult Will get D-dimer to evaluate for possibility of PE given history of lung cancer

## 2022-12-28 NOTE — Progress Notes (Signed)
*  PRELIMINARY RESULTS* Echocardiogram 2D Echocardiogram has been performed.  Carolyne Fiscal 12/28/2022, 10:04 AM

## 2022-12-28 NOTE — Progress Notes (Signed)
ANTICOAGULATION CONSULT NOTE - Initial Consult  Pharmacy Consult for Heparin  Indication: chest pain/ACS  Allergies  Allergen Reactions   Morphine And Codeine     Patient Measurements: Height: 6\' 1"  (185.4 cm) Weight: 111.1 kg (245 lb) IBW/kg (Calculated) : 79.9 Heparin Dosing Weight: 103.3 kg   Vital Signs: Temp: 97.4 F (36.3 C) (08/01 0100) Temp Source: Oral (07/31 2200) BP: 166/95 (08/01 0100) Pulse Rate: 77 (08/01 0100)  Labs: Recent Labs    12/27/22 2203 12/28/22 0045  HGB 11.4*  --   HCT 35.6*  --   PLT 197  --   CREATININE 1.86*  --   TROPONINIHS 24* 100*    Estimated Creatinine Clearance: 44.8 mL/min (A) (by C-G formula based on SCr of 1.86 mg/dL (H)).   Medical History: Past Medical History:  Diagnosis Date   Anxiety    Aortic atherosclerosis (HCC)    Arthritis    SHOULDER   BPH (benign prostatic hyperplasia)    Cataract    Chronic kidney disease    low kidney function   Colon adenomas    Colon cancer (HCC) 2015   Pt states during his colonoscopy he had cancerous polyps removed.    COPD (chronic obstructive pulmonary disease) (HCC)    Depression    SINCE DIAGNOSIS   Dyspnea    DOE   Enlarged prostate    GERD (gastroesophageal reflux disease)    Hypertension    Hypothyroidism    Lung cancer [facility])    Aug 2018   Pain    DUE TO LUNG ISSUE    Medications:  (Not in a hospital admission)   Assessment: Pharmacy consulted on this 77 year old male admitted with ACS/NSTEMI. No prior anticoag noted.  CrCl = 44.8 ml/min  Goal of Therapy:  HL :  0.3 - 0.7  Monitor platelets by anticoagulation protocol: Yes   Plan:  Give 4000 units bolus x 1 Start heparin infusion at 1400 units/hr Check anti-Xa level in 8 hours and daily while on heparin Continue to monitor H&H and platelets  Korine Winton D 12/28/2022,2:01 AM

## 2022-12-28 NOTE — Assessment & Plan Note (Signed)
Secondary to immunotherapy Continue prednisone 10 mg every morning 2.5 every afternoon

## 2022-12-28 NOTE — ED Notes (Signed)
ED Provider at bedside. 

## 2022-12-28 NOTE — Assessment & Plan Note (Signed)
Followed by urology.   

## 2022-12-28 NOTE — Assessment & Plan Note (Signed)
Followed by oncology 

## 2022-12-28 NOTE — Assessment & Plan Note (Signed)
Continue levothyroxine 175 mcg daily. 

## 2022-12-28 NOTE — Consult Note (Signed)
[facility] CLINIC CARDIOLOGY CONSULT NOTE       Patient ID: Marc Gray MRN: [id number] DOB/AGE: Oct 22, 1945 77 y.o.  Admit date: 12/27/2022 Referring Physician Dr. Lindajo Royal  Primary Physician Dr. [name] Primary Cardiologist previous Dr. 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  Cardiology is consulted for further assistance  The patient provides the entirety of the history.  He was referred to Dr. Lady Gary in 2019 from his PCP for chest pain.  At that time his chest pain was sharp in character and short-lived in duration.  He underwent Lexiscan Myoview in 06/2017 without obvious ischemia but was severely short of breath during the study.  He subsequently had a stress cardiac MRI which showed normal LVEF of 57% without infarct, scar, or infiltration, also without pericardial effusion.  He was lost to cardiology follow-up after 2020.  He has never had a heart catheterization.  He presents today with a 50-month history of progressively worsening exertional chest pressure and burning associated with shortness of breath.  This is progressed to the point of having symptoms with minimal household activity, and resolving with sitting and resting.  He denies radiation of the pain, no associated nausea or vomiting.  No heart racing or palpitations, no orthopnea, PND, or peripheral edema.  The symptoms are more severe than different in character than in 2019.  In my time of evaluation he is chest pain-free, although he notes he has not really been out of bed/exert  himself today.  He is conversational and comfortable on room air.  In the emergency department he was given 325 mg aspirin, and started on a heparin infusion.  EKG demonstrates sinus rhythm with first-degree AV block rate 69 bpm, poor R wave progression.  Troponins are elevated and trending 24, 100, 142, and 105 with repeats pending.  BNP borderline elevated at 153.  Chest x-ray read by radiology without active cardiopulmonary disease.  BUN/creatinine improving this morning at 41/1.46 and GFR 50 compared to 46/1.86 and GFR 37 when checked yesterday evening.  Lipid panel with TC 128, HDL 54, LDL 59, triglycerides 74.  A1c pending.  Family history of CAD requiring multiple stents in his mother at age 43-80.  History of significant tobacco use, but quit during his treatment for lung cancer.   Review of systems complete and found to be negative unless listed above     Past Medical History:  Diagnosis Date   Anxiety    Aortic atherosclerosis (HCC)    Arthritis    SHOULDER   BPH (benign prostatic hyperplasia)    Cataract    Chronic kidney disease    low kidney function   Colon adenomas    Colon cancer (HCC) 2015   Pt states during his colonoscopy he had cancerous polyps removed.    COPD (chronic obstructive pulmonary disease) (HCC)    Depression    SINCE DIAGNOSIS   Dyspnea    DOE   Enlarged prostate    GERD (gastroesophageal reflux disease)    Hypertension    Hypothyroidism    Lung cancer [facility])    Aug 2018   Pain  DUE TO LUNG ISSUE    Past Surgical History:  Procedure Laterality Date   BACK SURGERY     kyphoplasty  T5-6   CATARACT EXTRACTION W/ INTRAOCULAR LENS  IMPLANT, BILATERAL     COLONOSCOPY     COLONOSCOPY WITH PROPOFOL N/A 12/17/2019   Procedure: COLONOSCOPY WITH PROPOFOL;  Surgeon: Earline Mayotte, MD;  Location: [facility] ENDOSCOPY;  Service: Endoscopy;  Laterality: N/A;   EYE SURGERY Bilateral    cataract   LYMPH GLAND EXCISION N/A 02/09/2017   Procedure: CERVICAL  LYMPH NODE BIOPSY;  Surgeon: Earline Mayotte, MD;  Location: [facility] ORS;  Service: General;  Laterality: N/A;   PORTACATH PLACEMENT Right 02/23/2017   Procedure: INSERTION PORT-A-CATH;  Surgeon: Earline Mayotte, MD;  Location: [facility] ORS;  Service: General;  Laterality: Right;   PROSTATE SURGERY     TONSILLECTOMY     TRANSURETHRAL RESECTION OF PROSTATE N/A 02/14/2019   Procedure: TRANSURETHRAL RESECTION OF THE PROSTATE (TURP);  Surgeon: Sondra Come, MD;  Location: [facility] ORS;  Service: Urology;  Laterality: N/A;    Medications Prior to Admission  Medication Sig Dispense Refill Last Dose   aspirin EC 81 MG tablet Take 1 tablet (81 mg total) by mouth daily. Swallow whole. 90 tablet 0 12/27/2022 at AM   BREZTRI AEROSPHERE 160-9-4.8 MCG/ACT AERO Inhale 2 puffs into the lungs 2 (two) times daily.   12/27/2022 at 1600   calcium-vitamin D (OSCAL WITH D) 500-200 MG-UNIT tablet Take 1 tablet by mouth daily. 90 tablet 1 12/27/2022   DUPIXENT 300 MG/2ML SOPN Inject 300 mg into the skin every 14 (fourteen) days.      fluticasone (FLONASE) 50 MCG/ACT nasal spray Place 1 spray into both nostrils daily. 16 g 2 12/27/2022   furosemide (LASIX) 20 MG tablet Take by mouth.   12/27/2022 at AM   Ipratropium-Albuterol (COMBIVENT) 20-100 MCG/ACT AERS respimat Inhale into the lungs.   12/27/2022 at 1600   levothyroxine (SYNTHROID) 150 MCG tablet Take 1 tablet by mouth daily.   12/27/2022   montelukast (SINGULAIR) 10 MG tablet Take 10 mg by mouth daily.   12/27/2022   pantoprazole (PROTONIX) 40 MG tablet Take 1 tablet (40 mg total) by mouth daily. 90 tablet 0 12/27/2022   predniSONE (DELTASONE) 5 MG tablet Take 5 mg by mouth daily.   12/27/2022   prochlorperazine (COMPAZINE) 10 MG tablet Take 1 tablet (10 mg total) by mouth every 6 (six) hours as needed for nausea or vomiting. 60 tablet 2 12/27/2022 at PRN   rosuvastatin (CRESTOR) 10 MG tablet    12/27/2022   tamsulosin (FLOMAX) 0.4 MG CAPS capsule Take 1 capsule (0.4 mg  total) by mouth daily. 90 capsule 3 12/27/2022   traZODone (DESYREL) 100 MG tablet Take 100 mg by mouth at bedtime.   Past Week at PRN   acetaminophen (TYLENOL) 500 MG tablet Take 1,000 mg by mouth every 6 (six) hours as needed for moderate pain.   PRN at PRN   Azelastine HCl 137 MCG/SPRAY SOLN SMARTSIG:1-2 Spray(s) Both Nares Twice Daily      diphenhydrAMINE-zinc acetate (BENADRYL EXTRA STRENGTH) cream Apply 1 application topically 3 (three) times daily as needed for itching. 28 g 0    dupilumab (DUPIXENT) 200 MG/1. prefilled syringe Inject 200 mg into the skin every 14 (fourteen) days. (Patient not taking: Reported on 12/28/2022)   Not Taking   EPINEPHrine 0.3 mg/0.3 mL IJ SOAJ injection Inject 0.3 mg into the muscle as needed.   PRN at PRN  levothyroxine (SYNTHROID) 175 MCG tablet  (Patient not taking: Reported on 12/28/2022)   Not Taking   lidocaine-prilocaine (EMLA) cream Apply over port 1-2 hours prior to chemotherapy treatment.  Cover with plastic wrap. 30 g 1    Melatonin 10 MG TABS Take 10 mg by mouth at bedtime.       Multiple Vitamin (MULTI-VITAMINS) TABS Take 1 tablet by mouth daily.       ondansetron (ZOFRAN) 8 MG tablet Take 1 tablet (8 mg total) by mouth every 12 (twelve) hours as needed for nausea or vomiting. 60 tablet 0    sertraline (ZOLOFT) 25 MG tablet TAKE 1 TABLET(25 MG) BY MOUTH DAILY (Patient not taking: Reported on 12/28/2022) 90 tablet 0 Not Taking   tadalafil (CIALIS) 5 MG tablet Take 5-10mg  by mouth as needed prior to intercourse 30 tablet 11    triamcinolone ointment (KENALOG) 0.5 % Apply 1 application topically 3 (three) times daily as needed. 30 g 1    Social History   Socioeconomic History   Marital status: Married    Spouse name: Not on file   Number of children: Not on file   Years of education: Not on file   Highest education level: Not on file  Occupational History   Not on file  Tobacco Use   Smoking status: Former    Current packs/day: 0.00    Average  packs/day: 1 pack/day for 53.0 years (53.0 ttl pk-yrs)    Types: Cigarettes    Start date: 04/14/1964    Quit date: 04/14/2017    Years since quitting: 5.7    Passive exposure: Past   Smokeless tobacco: Never   Tobacco comments:    Quit November 2018  Vaping Use   Vaping status: Never Used  Substance and Sexual Activity   Alcohol use: Not Currently    Comment: occassional   Drug use: No   Sexual activity: Yes  Other Topics Concern   Not on file  Social History Narrative   Not on file   Social Determinants of Health   Financial Resource Strain: Patient Declined (07/27/2022)   Received from [facility], [facility]   Overall Financial Resource Strain (CARDIA)    Difficulty of Paying Living Expenses: Patient declined  Food Insecurity: Patient Declined (07/27/2022)   Received from [facility], [facility]   Hunger Vital Sign    Worried About Running Out of Food in the Last Year: Patient declined    Ran Out of Food in the Last Year: Patient declined  Transportation Needs: Patient Declined (07/27/2022)   Received from [facility], [facility]   PRAPARE - Transportation    In the past 12 months, has lack of transportation kept you from medical appointments or from getting medications?: Patient declined    Lack of Transportation (Non-Medical): Patient declined  Physical Activity: Not on file  Stress: Not on file  Social Connections: Not on file  Intimate Partner Violence: Not on file    Family History  Problem Relation Age of Onset   Breast cancer Maternal Grandmother    Dementia Mother    Diabetes Father    Stroke Father      No intake or output data in the 24 hours ending 12/28/22 1351  Vitals:   12/28/22 0630 12/28/22 0700 12/28/22 0730 12/28/22 1117  BP: (!) 140/94 (!) 151/87 (!) 150/88 (!) 148/75  Pulse: 65 (!) 57 (!) 56 60  Resp:  13 16 18 18   Temp: 98.5 F  (36.9 C)   (!) 97.5 F (36.4 C)  TempSrc:      SpO2: 99% 98% 97% 94%  Weight:      Height:        PHYSICAL EXAM General: Very pleasant conversational elderly Caucasian male, well nourished, in no acute distress.  Sitting upright with wife at bedside. HEENT:  Normocephalic and atraumatic. Neck:  No JVD.  Lungs: Normal respiratory effort on room air.  Decreased breath sounds without appreciable crackles or wheezes.   Heart: HRRR . Normal S1 and S2 without gallops or murmurs.  Abdomen: Non-distended appearing.  Msk: Normal strength and tone for age. Extremities: Warm and well perfused. No clubbing, cyanosis.  No significant peripheral edema.  Neuro: Alert and oriented X 3. Psych:  Answers questions appropriately.   Labs: Basic Metabolic Panel: Recent Labs    12/27/22 2203 12/28/22 0802  NA 137 139  K 4.3 4.0  CL 107 106  CO2 23 23  GLUCOSE 127* 103*  BUN 46* 41*  CREATININE 1.86* 1.46*  CALCIUM 8.6* 8.3*   Liver Function Tests: Recent Labs    12/27/22 2203  AST 27  ALT 28  ALKPHOS 37*  BILITOT 0.5  PROT 6.8  ALBUMIN 3.9   No results for input(s): "LIPASE", "AMYLASE" in the last 72 hours. CBC: Recent Labs    12/27/22 2203  WBC 8.0  NEUTROABS 6.4  HGB 11.4*  HCT 35.6*  MCV 85.4  PLT 197   Cardiac Enzymes: Recent Labs    12/28/22 0045 12/28/22 0802 12/28/22 1103  TROPONINIHS 100* 142* 105*   BNP: Recent Labs    12/28/22 0045  BNP 152.9*   D-Dimer: Recent Labs    12/28/22 0620  DDIMER 0.95*   Hemoglobin A1C: No results for input(s): "HGBA1C" in the last 72 hours. Fasting Lipid Panel: Recent Labs    12/28/22 1103  CHOL 128  HDL 54  LDLCALC 59  TRIG 74  CHOLHDL 2.4   Thyroid Function Tests: No results for input(s): "TSH", "T4TOTAL", "T3FREE", "THYROIDAB" in the last 72 hours.  Invalid input(s): "FREET3" Anemia Panel: No results for input(s): "VITAMINB12", "FOLATE", "FERRITIN", "TIBC", "IRON", "RETICCTPCT" in the last 72 hours.    Radiology: ECHOCARDIOGRAM COMPLETE  Result Date: 12/28/2022    ECHOCARDIOGRAM REPORT   Patient Name:   Marc Gray Date of Exam: 12/28/2022 Medical Rec #:  [id number]                    Height:       73.0 in Accession #:    [id number]                   Weight:       245.0 lb Date of Birth:  02-18-1946                     BSA:          2.345 m Patient Age:    76 years                     BP:           150/88 mmHg Patient Gender: M                            HR:           63 bpm. Exam Location:  [facility] Procedure: 2D Echo, Cardiac Doppler, Color Doppler and Strain Analysis Indications:     Chest Pain  History:         Patient has no prior history of Echocardiogram examinations.                  Acute MI; Signs/Symptoms:Chest Pain and Shortness of Breath.  Sonographer:     Mikki Harbor Referring Phys:  [id number]  MICHELLE  Diagnosing Phys: Marcina Millard MD  Sonographer Comments: Global longitudinal strain was attempted. IMPRESSIONS  1. Left ventricular ejection fraction, by estimation, is 55 to 60%. The left ventricle has normal function. The left ventricle has no regional wall motion abnormalities. Left ventricular diastolic parameters were normal.  2. Right ventricular systolic function is normal. The right ventricular size is normal.  3. The mitral valve is normal in structure. Trivial mitral valve regurgitation. No evidence of mitral stenosis.  4. The aortic valve is normal in structure. Aortic valve regurgitation is not visualized. No aortic stenosis is present.  5. The inferior vena cava is normal in size with greater than 50% respiratory variability, suggesting right atrial pressure of 3 mmHg. FINDINGS  Left Ventricle: Left ventricular ejection fraction, by estimation, is 55 to 60%. The left ventricle has normal function. The left ventricle has no regional wall motion abnormalities. The left ventricular internal cavity size was normal in size. There is  no left ventricular  hypertrophy. Left ventricular diastolic parameters were normal. Right Ventricle: The right ventricular size is normal. No increase in right ventricular wall thickness. Right ventricular systolic function is normal. Left Atrium: Left atrial size was normal in size. Right Atrium: Right atrial size was normal in size. Pericardium: There is no evidence of pericardial effusion. Mitral Valve: The mitral valve is normal in structure. Trivial mitral valve regurgitation. No evidence of mitral valve stenosis. MV peak gradient, 4.1 mmHg. The mean mitral valve gradient is 2.0 mmHg. Tricuspid Valve: The tricuspid valve is normal in structure. Tricuspid valve regurgitation is trivial. No evidence of tricuspid stenosis. Aortic Valve: The aortic valve is normal in structure. Aortic valve regurgitation is not visualized. No aortic stenosis is present. Aortic valve mean gradient measures 3.0 mmHg. Aortic valve peak gradient measures 7.3 mmHg. Aortic valve area, by VTI measures 2.60 cm. Pulmonic Valve: The pulmonic valve was normal in structure. Pulmonic valve regurgitation is not visualized. No evidence of pulmonic stenosis. Aorta: The aortic root is normal in size and structure. Venous: The inferior vena cava is normal in size with greater than 50% respiratory variability, suggesting right atrial pressure of 3 mmHg. IAS/Shunts: No atrial level shunt detected by color flow Doppler.  LEFT VENTRICLE PLAX 2D LVIDd:         4.80 cm     Diastology LVIDs:         2.70 cm     LV e' medial:    5.66 cm/s LV PW:         1.40 cm     LV E/e' medial:  18.2 LV IVS:        1.40 cm     LV e' lateral:   9.79 cm/s LVOT diam:     2.00 cm     LV E/e' lateral: 10.5 LV SV:         79 LV SV Index:   34 LVOT Area:     3.14 cm  LV Volumes (MOD) LV vol d, MOD A2C: 51.5 ml LV vol d, MOD A4C: 77.9 ml LV  vol s, MOD A2C: 22.5 ml LV vol s, MOD A4C: 33.5 ml LV SV MOD A2C:     29.0 ml LV SV MOD A4C:     77.9 ml LV SV MOD BP:      37.9 ml RIGHT VENTRICLE RV Basal  diam:  3.90 cm RV Mid diam:    3.00 cm RV S prime:     10.90 cm/s LEFT ATRIUM             Index        RIGHT ATRIUM           Index LA diam:        4.00 cm 1.71 cm/m   RA Area:     25.80 cm LA Vol (A2C):   52.8 ml 22.49 ml/m  RA Volume:   84.70 ml  36.11 ml/m LA Vol (A4C):   59.4 ml 25.33 ml/m LA Biplane Vol: 54.9 ml 23.41 ml/m  AORTIC VALVE                    PULMONIC VALVE AV Area (Vmax):    2.65 cm     PV Vmax:       0.94 m/s AV Area (Vmean):   2.88 cm     PV Peak grad:  3.5 mmHg AV Area (VTI):     2.60 cm AV Vmax:           135.00 cm/s AV Vmean:          73.900 cm/s AV VTI:            0.304 m AV Peak Grad:      7.3 mmHg AV Mean Grad:      3.0 mmHg LVOT Vmax:         114.00 cm/s LVOT Vmean:        67.700 cm/s LVOT VTI:          0.252 m LVOT/AV VTI ratio: 0.83  AORTA Ao Root diam: 3.90 cm MITRAL VALVE MV Area (PHT): 3.91 cm     SHUNTS MV Area VTI:   1.97 cm     Systemic VTI:  0.25 m MV Peak grad:  4.1 mmHg     Systemic Diam: 2.00 cm MV Mean grad:  2.0 mmHg MV Vmax:       1.01 m/s MV Vmean:      66.1 cm/s MV Decel Time: 194 msec MV E velocity: 103.00 cm/s MV A velocity: 94.90 cm/s MV E/A ratio:  1.09 Marcina Millard MD Electronically signed by Marcina Millard MD Signature Date/Time: 12/28/2022/1:31:17 PM    Final    DG Chest 1 View  Result Date: 12/27/2022 CLINICAL DATA:  Chest pain. EXAM: CHEST  1 VIEW COMPARISON:  Chest radiograph dated 02/23/2017 and CT dated 03/22/2022. FINDINGS: Shallow inspiration. No focal consolidation, pleural effusion, pneumothorax. Stable cardiac silhouette. Atherosclerotic calcification of the aortic arch. No acute osseous pathology. Thoracic vertebroplasty changes noted. IMPRESSION: No active disease. Electronically Signed   By: Elgie Collard M.D.   On: 12/27/2022 22:57    TELEMETRY reviewed by me (LT) 12/28/2022 : Sinus rhythm  EKG reviewed by me: Sinus rhythm first-degree AV block rate 67, poor R wave progression  Data reviewed by me (LT) 12/28/2022: Last  outpatient cardiology note, ED note, admission H&P last 24h vitals tele labs imaging I/O   Principal Problem:   NSTEMI (non-ST elevated myocardial infarction) [facility]) Active Problems:   Adenocarcinoma of left lung, stage 3 (HCC)   Hypothyroidism  due to medication   Adrenal insufficiency (HCC)   MGUS (monoclonal gammopathy of unknown significance)   History of prostate cancer   Stage 3a chronic kidney disease (HCC)    ASSESSMENT AND PLAN:  Marc Gray "Marc Gray" is a 76yoM with a PMH of COPD and asthma, 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  Cardiology is consulted for further assistance  # NSTEMI Presents with 2 months of progressively worsening exertional substernal burning/pressure, different in character than his prior chest pain syndrome in 2019/2020.[name]  EKG shows normal sinus rhythm with poor R wave progression, troponins borderline elevated and trending 24, 100, 142, 105.  Risk factors of significant smoking history, obesity, and a family history of CAD. -S/p 325 mg aspirin, continue 81 mg aspirin daily -Continue heparin infusion until LHC -Metoprolol tartrate 12.5 mg twice daily -Atorvastatin 40 mg daily -ECHO complete -Risk factor modification with lipid panel, A1c -Risk, benefits, and potential outcomes of left heart catheterization discussed with the patient and his wife at bedside.  He is agreeable to proceed.  Written consent will be obtained.  Plan for [facility] tomorrow, 8/2 at 7:30 AM with Dr. Marcina Millard.  N.p.o. at midnight tonight except for sips with meds.   This patient's plan of care was discussed and created with Dr. Darrold Junker and he is in  agreement.  Signed: Rebeca Allegra , PA-C 12/28/2022, 1:51 PM [facility]

## 2022-12-28 NOTE — Assessment & Plan Note (Signed)
Renal function at or near baseline

## 2022-12-28 NOTE — Progress Notes (Signed)
ANTICOAGULATION CONSULT NOTE  Pharmacy Consult for Heparin  Indication: chest pain/ACS  Allergies  Allergen Reactions   Morphine And Codeine     Patient Measurements: Height: 6\' 1"  (185.4 cm) Weight: 111.1 kg (245 lb) IBW/kg (Calculated) : 79.9 Heparin Dosing Weight: 103.3 kg   Vital Signs: Temp: 97.8 F (36.6 C) (08/01 1923) BP: 126/81 (08/01 1923) Pulse Rate: 67 (08/01 1923)  Labs: Recent Labs    12/27/22 2203 12/28/22 0045 12/28/22 0227 12/28/22 0802 12/28/22 1103 12/28/22 1331 12/28/22 1534 12/28/22 2038  HGB 11.4*  --   --   --   --   --   --   --   HCT 35.6*  --   --   --   --   --   --   --   PLT 197  --   --   --   --   --   --   --   APTT  --   --  30  --   --   --   --   --   LABPROT  --   --  14.1  --   --   --   --   --   INR  --   --  1.1  --   --   --   --   --   HEPARINUNFRC  --   --   --   --  0.26*  --   --  0.48  CREATININE 1.86*  --   --  1.46*  --   --   --   --   TROPONINIHS 24*   < >  --  142* 105* 89* 81*  --    < > = values in this interval not displayed.    Estimated Creatinine Clearance: 57.1 mL/min (A) (by C-G formula based on SCr of 1.46 mg/dL (H)).   Medical History: Past Medical History:  Diagnosis Date   Anxiety    Aortic atherosclerosis (HCC)    Arthritis    SHOULDER   BPH (benign prostatic hyperplasia)    Cataract    Chronic kidney disease    low kidney function   Colon adenomas    Colon cancer (HCC) 2015   Pt states during his colonoscopy he had cancerous polyps removed.    COPD (chronic obstructive pulmonary disease) (HCC)    Depression    SINCE DIAGNOSIS   Dyspnea    DOE   Enlarged prostate    GERD (gastroesophageal reflux disease)    Hypertension    Hypothyroidism    Lung cancer (HCC)    Aug 2018   Pain    DUE TO LUNG ISSUE    Medications:  Medications Prior to Admission  Medication Sig Dispense Refill Last Dose   aspirin EC 81 MG tablet Take 1 tablet (81 mg total) by mouth daily. Swallow whole. 90  tablet 0 12/27/2022 at AM   BREZTRI AEROSPHERE 160-9-4.8 MCG/ACT AERO Inhale 2 puffs into the lungs 2 (two) times daily.   12/27/2022 at 1600   calcium-vitamin D (OSCAL WITH D) 500-200 MG-UNIT tablet Take 1 tablet by mouth daily. 90 tablet 1 12/27/2022   DUPIXENT 300 MG/2ML SOPN Inject 300 mg into the skin every 14 (fourteen) days.      fluticasone (FLONASE) 50 MCG/ACT nasal spray Place 1 spray into both nostrils daily. 16 g 2 12/27/2022   furosemide (LASIX) 20 MG tablet Take by mouth.   12/27/2022 at AM  Ipratropium-Albuterol (COMBIVENT) 20-100 MCG/ACT AERS respimat Inhale into the lungs.   12/27/2022 at 1600   levothyroxine (SYNTHROID) 150 MCG tablet Take 1 tablet by mouth daily.   12/27/2022   montelukast (SINGULAIR) 10 MG tablet Take 10 mg by mouth daily.   12/27/2022   pantoprazole (PROTONIX) 40 MG tablet Take 1 tablet (40 mg total) by mouth daily. 90 tablet 0 12/27/2022   predniSONE (DELTASONE) 5 MG tablet Take 5 mg by mouth daily.   12/27/2022   prochlorperazine (COMPAZINE) 10 MG tablet Take 1 tablet (10 mg total) by mouth every 6 (six) hours as needed for nausea or vomiting. 60 tablet 2 12/27/2022 at PRN   rosuvastatin (CRESTOR) 10 MG tablet    12/27/2022   tamsulosin (FLOMAX) 0.4 MG CAPS capsule Take 1 capsule (0.4 mg total) by mouth daily. 90 capsule 3 12/27/2022   traZODone (DESYREL) 100 MG tablet Take 100 mg by mouth at bedtime.   Past Week at PRN   acetaminophen (TYLENOL) 500 MG tablet Take 1,000 mg by mouth every 6 (six) hours as needed for moderate pain.   PRN at PRN   Azelastine HCl 137 MCG/SPRAY SOLN SMARTSIG:1-2 Spray(s) Both Nares Twice Daily      diphenhydrAMINE-zinc acetate (BENADRYL EXTRA STRENGTH) cream Apply 1 application topically 3 (three) times daily as needed for itching. 28 g 0    dupilumab (DUPIXENT) 200 MG/1. prefilled syringe Inject 200 mg into the skin every 14 (fourteen) days. (Patient not taking: Reported on 12/28/2022)   Not Taking   EPINEPHrine 0.3 mg/0.3 mL IJ SOAJ  injection Inject 0.3 mg into the muscle as needed.   PRN at PRN   levothyroxine (SYNTHROID) 175 MCG tablet  (Patient not taking: Reported on 12/28/2022)   Not Taking   lidocaine-prilocaine (EMLA) cream Apply over port 1-2 hours prior to chemotherapy treatment.  Cover with plastic wrap. 30 g 1    Melatonin 10 MG TABS Take 10 mg by mouth at bedtime.       Multiple Vitamin (MULTI-VITAMINS) TABS Take 1 tablet by mouth daily.       ondansetron (ZOFRAN) 8 MG tablet Take 1 tablet (8 mg total) by mouth every 12 (twelve) hours as needed for nausea or vomiting. 60 tablet 0    sertraline (ZOLOFT) 25 MG tablet TAKE 1 TABLET(25 MG) BY MOUTH DAILY (Patient not taking: Reported on 12/28/2022) 90 tablet 0 Not Taking   tadalafil (CIALIS) 5 MG tablet Take 5-10mg  by mouth as needed prior to intercourse 30 tablet 11    triamcinolone ointment (KENALOG) 0.5 % Apply 1 application topically 3 (three) times daily as needed. 30 g 1     Assessment: Pharmacy consulted on this 77 year old male admitted with ACS/NSTEMI. No prior anticoag noted.  CrCl = 44.8 ml/min  Goal of Therapy:  HL :  0.3 - 0.7  Monitor platelets by anticoagulation protocol: Yes    8/1@1103 : HL 0.26, subtherapeutic 8/01 2039 HL 0.48, therapeutic x 1  Plan:  - Continue heparin infusion at 1550 units/hr - Recheck HL w/ AM lab to confirm, then daily while on heparin - Continue to monitor H&H and platelets  Otelia Sergeant, PharmD, [facility] 12/28/2022 9:14 PM

## 2022-12-28 NOTE — Assessment & Plan Note (Signed)
Completed treatment, chemoradiation and immunotherapy 2020 with no CT evidence of recurrence Last seen by oncology in April 2024

## 2022-12-28 NOTE — H&P (Signed)
History and Physical    Patient: Marc Gray MVH:[id number] DOB: 08/19/1945 DOA: 12/27/2022 DOS: the patient was seen and examined on 12/28/2022 PCP: [name], MD  Patient coming from: Home  Chief Complaint:  Chief Complaint  Patient presents with   Shortness of Breath   Chest Pain    HPI: Marc Gray is a 77 y.o. 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  The pain is midsternal and radiating to the left side described as a burning.  He denies lower extremity pain or swelling. ED course and data review: Vitals within normal limits.  Troponin 24> 100 with BNP 152.  Labs otherwise at baseline.  COVID-negative EKG, Personally viewed and interpreted showing sinus at 98 with no acute ischemic ST-T wave changes.  Chest x-ray shows no active disease Patient was given chewable aspirin and placed on a heparin infusion.  He was chest pain-free admission. Hospitalist consulted for admission.   Review of Systems: As mentioned in the history of present illness. All other systems reviewed and are negative.  Past Medical History:  Diagnosis Date   Anxiety    Aortic atherosclerosis (HCC)    Arthritis    SHOULDER   BPH (benign prostatic hyperplasia)    Cataract    Chronic kidney disease    low kidney function   Colon adenomas    Colon cancer (HCC) 2015   Pt states during his colonoscopy he had cancerous polyps removed.    COPD (chronic obstructive pulmonary disease) (HCC)    Depression    SINCE DIAGNOSIS   Dyspnea    DOE   Enlarged prostate    GERD (gastroesophageal reflux disease)     Hypertension    Hypothyroidism    Lung cancer (HCC)    Aug 2018   Pain    DUE TO LUNG ISSUE   Past Surgical History:  Procedure Laterality Date   BACK SURGERY     kyphoplasty  T5-6   CATARACT EXTRACTION W/ INTRAOCULAR LENS  IMPLANT, BILATERAL     COLONOSCOPY     COLONOSCOPY WITH PROPOFOL N/A 12/17/2019   Procedure: COLONOSCOPY WITH PROPOFOL;  Surgeon: Earline Mayotte, MD;  Location: [facility] END[facility];  Service: Endoscopy;  Laterality: N/A;   EYE SURGERY Bilateral    cataract   LYMPH GLAND EXCISION N/A 02/09/2017   Procedure: CERVICAL LYMPH NODE BIOPSY;  Surgeon: Earline Mayotte, MD;  Location: [facility] ORS;  Service: General;  Laterality: N/A;   PORTACATH PLACEMENT Right 02/23/2017   Procedure: INSERTION PORT-A-CATH;  Surgeon: Earline Mayotte, MD;  Location: [facility] ORS;  Service: General;  Laterality: Right;   PROSTATE SURGERY     TONSILLECTOMY     TRANSURETHRAL RESECTION OF PROSTATE N/A 02/14/2019   Procedure: TRANSURETHRAL RESECTION OF THE PROSTATE (TURP);  Surgeon: Sondra Come, MD;  Location: [facility] ORS;  Service: Urology;  Laterality: N/A;   Social History:  reports that he quit smoking about 5 years ago. His smoking use included cigarettes. He started smoking about 58 years ago. He has a 53 pack-year smoking history. He has been exposed to tobacco smoke. He has never used smokeless tobacco. He reports that  he does not currently use alcohol. He reports that he does not use drugs.  Allergies  Allergen Reactions   Morphine And Codeine     Family History  Problem Relation Age of Onset   Breast cancer Maternal Grandmother    Dementia Mother    Diabetes Father    Stroke Father     Prior to Admission medications   Medication Sig Start Date End Date Taking? Authorizing Provider  acetaminophen (TYLENOL) 500 MG tablet Take 1,000 mg by mouth every 6 (six) hours as needed for moderate pain.    [provider]  aspirin EC 81 MG tablet Take 1 tablet (81 mg total) by  mouth daily. Swallow whole. 03/22/21   Rickard Patience, MD  Azelastine HCl 137 MCG/SPRAY SOLN SMARTSIG:1-2 Spray(s) Both Nares Twice Daily 09/15/19   [provider]  BREZTRI AEROSPHERE 160-9-4.8 MCG/ACT AERO Inhale 2 puffs into the lungs 2 (two) times daily. 02/11/20   [provider]  calcium-vitamin D (OSCAL WITH D) 500-200 MG-UNIT tablet Take 1 tablet by mouth daily. 06/25/18   Rickard Patience, MD  diphenhydrAMINE-zinc acetate (BENADRYL EXTRA STRENGTH) cream Apply 1 application topically 3 (three) times daily as needed for itching. 07/20/17   Rickard Patience, MD  dupilumab (DUPIXENT) 200 MG/1. prefilled syringe Inject 200 mg into the skin every 14 (fourteen) days.    [provider]  EPINEPHrine 0.3 mg/0.3 mL IJ SOAJ injection Inject 0.3 mg into the muscle as needed. 02/08/22   [provider]  fluticasone (FLONASE) 50 MCG/ACT nasal spray Place 1 spray into both nostrils daily. 09/25/18   Rickard Patience, MD  furosemide (LASIX) 20 MG tablet Take by mouth. 06/09/20   [provider]  Ipratropium-Albuterol (COMBIVENT) 20-100 MCG/ACT AERS respimat Inhale into the lungs. 11/26/19   [provider]  levothyroxine (SYNTHROID) 175 MCG tablet  08/29/21   [provider]  lidocaine-prilocaine (EMLA) cream Apply over port 1-2 hours prior to chemotherapy treatment.  Cover with plastic wrap. 11/07/19   Rickard Patience, MD  Melatonin 10 MG TABS Take 10 mg by mouth at bedtime.     [provider]  montelukast (SINGULAIR) 10 MG tablet Take 10 mg by mouth daily. 10/06/21   [provider]  Multiple Vitamin (MULTI-VITAMINS) TABS Take 1 tablet by mouth daily.     [provider]  ondansetron (ZOFRAN) 8 MG tablet Take 1 tablet (8 mg total) by mouth every 12 (twelve) hours as needed for nausea or vomiting. 11/21/18   Rickard Patience, MD  pantoprazole (PROTONIX) 40 MG tablet Take 1 tablet (40 mg total) by mouth daily. 06/04/19   Rickard Patience, MD  predniSONE (DELTASONE) 5 MG tablet  Take 5 mg by mouth daily. 07/26/20   [provider]  prochlorperazine (COMPAZINE) 10 MG tablet Take 1 tablet (10 mg total) by mouth every 6 (six) hours as needed for nausea or vomiting. 11/13/17   Rickard Patience, MD  rosuvastatin (CRESTOR) 10 MG tablet  08/29/21   [provider]  sertraline (ZOLOFT) 25 MG tablet TAKE 1 TABLET(25 MG) BY MOUTH DAILY 03/10/19   Alinda Dooms, NP  tadalafil (CIALIS) 5 MG tablet Take 5-10mg  by mouth as needed prior to intercourse 11/15/22   Sondra Come, MD  tamsulosin (FLOMAX) 0.4 MG CAPS capsule Take 1 capsule (0.4 mg total) by mouth daily. 11/15/22   Sondra Come, MD  traZODone (DESYREL) 100 MG tablet Take 100 mg by mouth at bedtime. 09/16/20   [provider]  triamcinolone  ointment (KENALOG) 0.5 % Apply 1 application topically 3 (three) times daily as needed. 05/14/18   Rickard Patience, MD    Physical Exam: Vitals:   12/27/22 2200 12/28/22 0030 12/28/22 0100  BP: 120/74 (!) 146/85 (!) 166/95  Pulse: 100 80 77  Resp: 20 18 16   Temp:   (!) 97.4 F (36.3 C)  TempSrc: Oral    SpO2: 93% 98% 99%  Weight: 111.1 kg    Height: 6\' 1"  (1.854 m)     Physical Exam Vitals and nursing note reviewed.  Constitutional:      General: He is not in acute distress. HENT:     Head: Normocephalic and atraumatic.  Cardiovascular:     Rate and Rhythm: Normal rate and regular rhythm.     Heart sounds: Normal heart sounds.  Pulmonary:     Effort: Pulmonary effort is normal.     Breath sounds: Normal breath sounds.  Abdominal:     Palpations: Abdomen is soft.     Tenderness: There is no abdominal tenderness.  Neurological:     Mental Status: Mental status is at baseline.     Labs on Admission: I have personally reviewed following labs and imaging studies  CBC: Recent Labs  Lab 12/27/22 2203  WBC 8.0  NEUTROABS 6.4  HGB 11.4*  HCT 35.6*  MCV 85.4  PLT 197   Basic Metabolic Panel: Recent Labs  Lab 12/27/22 2203  NA 137  K 4.3  CL 107   CO2 23  GLUCOSE 127*  BUN 46*  CREATININE 1.86*  CALCIUM 8.6*   GFR: Estimated Creatinine Clearance: 44.8 mL/min (A) (by C-G formula based on SCr of 1.86 mg/dL (H)). Liver Function Tests: Recent Labs  Lab 12/27/22 2203  AST 27  ALT 28  ALKPHOS 37*  BILITOT 0.5  PROT 6.8  ALBUMIN 3.9   No results for input(s): "LIPASE", "AMYLASE" in the last 168 hours. No results for input(s): "AMMONIA" in the last 168 hours. Coagulation Profile: Recent Labs  Lab 12/28/22 0227  INR 1.1   Cardiac Enzymes: No results for input(s): "CKTOTAL", "CKMB", "CKMBINDEX", "TROPONINI" in the last 168 hours. BNP (last 3 results) No results for input(s): "PROBNP" in the last 8760 hours. HbA1C: No results for input(s): "HGBA1C" in the last 72 hours. CBG: No results for input(s): "GLUCAP" in the last 168 hours. Lipid Profile: No results for input(s): "CHOL", "HDL", "LDLCALC", "TRIG", "CHOLHDL", "LDLDIRECT" in the last 72 hours. Thyroid Function Tests: No results for input(s): "TSH", "T4TOTAL", "FREET4", "T3FREE", "THYROIDAB" in the last 72 hours. Anemia Panel: No results for input(s): "VITAMINB12", "FOLATE", "FERRITIN", "TIBC", "IRON", "RETICCTPCT" in the last 72 hours. Urine analysis:    Component Value Date/Time   COLORURINE YELLOW (A) 02/07/2019 1049   APPEARANCEUR CLEAR (A) 02/07/2019 1049   APPEARANCEUR Clear 02/05/2019 1325   LABSPEC 1.014 02/07/2019 1049   LABSPEC 1.008 09/27/2011 1748   PHURINE 6.0 02/07/2019 1049   GLUCOSEU NEGATIVE 02/07/2019 1049   GLUCOSEU Negative 09/27/2011 1748   HGBUR NEGATIVE 02/07/2019 1049   BILIRUBINUR NEGATIVE 02/07/2019 1049   BILIRUBINUR Negative 02/05/2019 1325   BILIRUBINUR Negative 09/27/2011 1748   KETONESUR NEGATIVE 02/07/2019 1049   PROTEINUR NEGATIVE 02/07/2019 1049   NITRITE NEGATIVE 02/07/2019 1049   LEUKOCYTESUR NEGATIVE 02/07/2019 1049   LEUKOCYTESUR Negative 09/27/2011 1748    Radiological Exams on Admission: DG Chest 1 View  Result  Date: 12/27/2022 CLINICAL DATA:  Chest pain. EXAM: CHEST  1 VIEW COMPARISON:  Chest radiograph dated 02/23/2017 and CT  dated 03/22/2022. FINDINGS: Shallow inspiration. No focal consolidation, pleural effusion, pneumothorax. Stable cardiac silhouette. Atherosclerotic calcification of the aortic arch. No acute osseous pathology. Thoracic vertebroplasty changes noted. IMPRESSION: No active disease. Electronically Signed   By: Elgie Collard M.D.   On: 12/27/2022 22:57     Data Reviewed: Relevant notes from primary care and specialist visits, past discharge summaries as available in EHR, including Care Everywhere. Prior diagnostic testing as pertinent to current admission diagnoses Updated medications and problem lists for reconciliation ED course, including vitals, labs, imaging, treatment and response to treatment Triage notes, nursing and pharmacy notes and ED provider's notes Notable results as noted in HPI   Assessment and Plan: * NSTEMI (non-ST elevated myocardial infarction) [facility]) Patient presents with chest pain Continue heparin infusion Aspirin, rosuvastatin Cardiology consult Will get D-dimer to evaluate for possibility of PE given history of lung cancer  Stage 3a chronic kidney disease (HCC) Renal function at or near baseline  History of prostate cancer Followed by urology  MGUS (monoclonal gammopathy of unknown significance) Followed by oncology  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 Darrold Junker  Advance Care Planning: full code  Family Communication: none  Disposition Plan: Back to  previous home environment  Severity of Illness: The appropriate patient status for this patient is INPATIENT. Inpatient status is judged to be reasonable and necessary in order to provide the required intensity of service to ensure the patient's safety. The patient's presenting symptoms, physical exam findings, and initial radiographic and laboratory data in the context of their chronic comorbidities is felt to place them at high risk for further clinical deterioration. Furthermore, it is not anticipated that the patient will be medically stable for discharge from the hospital within 2 midnights of admission.   * I certify that at the point of admission it is my clinical judgment that the patient will require inpatient hospital care spanning beyond 2 midnights from the point of admission due to high intensity of service, high risk for further deterioration and high frequency of surveillance required.*  Author: Andris Baumann, MD 12/28/2022 3:03 AM  For on call review [email or web address].

## 2022-12-28 NOTE — Progress Notes (Signed)
ANTICOAGULATION CONSULT NOTE - Initial Consult  Pharmacy Consult for Heparin  Indication: chest pain/ACS  Allergies  Allergen Reactions   Morphine And Codeine     Patient Measurements: Height: 6\' 1"  (185.4 cm) Weight: 111.1 kg (245 lb) IBW/kg (Calculated) : 79.9 Heparin Dosing Weight: 103.3 kg   Vital Signs: Temp: 97.5 F (36.4 C) (08/01 1117) BP: 148/75 (08/01 1117) Pulse Rate: 60 (08/01 1117)  Labs: Recent Labs    12/27/22 2203 12/28/22 0045 12/28/22 0227 12/28/22 0802 12/28/22 1103  HGB 11.4*  --   --   --   --   HCT 35.6*  --   --   --   --   PLT 197  --   --   --   --   APTT  --   --  30  --   --   LABPROT  --   --  14.1  --   --   INR  --   --  1.1  --   --   HEPARINUNFRC  --   --   --   --  0.26*  CREATININE 1.86*  --   --  1.46*  --   TROPONINIHS 24* 100*  --  142*  --     Estimated Creatinine Clearance: 57.1 mL/min (A) (by C-G formula based on SCr of 1.46 mg/dL (H)).   Medical History: Past Medical History:  Diagnosis Date   Anxiety    Aortic atherosclerosis (HCC)    Arthritis    SHOULDER   BPH (benign prostatic hyperplasia)    Cataract    Chronic kidney disease    low kidney function   Colon adenomas    Colon cancer (HCC) 2015   Pt states during his colonoscopy he had cancerous polyps removed.    COPD (chronic obstructive pulmonary disease) (HCC)    Depression    SINCE DIAGNOSIS   Dyspnea    DOE   Enlarged prostate    GERD (gastroesophageal reflux disease)    Hypertension    Hypothyroidism    Lung cancer (HCC)    Aug 2018   Pain    DUE TO LUNG ISSUE    Medications:  Medications Prior to Admission  Medication Sig Dispense Refill Last Dose   aspirin EC 81 MG tablet Take 1 tablet (81 mg total) by mouth daily. Swallow whole. 90 tablet 0 12/27/2022 at AM   BREZTRI AEROSPHERE 160-9-4.8 MCG/ACT AERO Inhale 2 puffs into the lungs 2 (two) times daily.   12/27/2022 at 1600   calcium-vitamin D (OSCAL WITH D) 500-200 MG-UNIT tablet Take 1  tablet by mouth daily. 90 tablet 1 12/27/2022   DUPIXENT 300 MG/2ML SOPN Inject 300 mg into the skin every 14 (fourteen) days.      fluticasone (FLONASE) 50 MCG/ACT nasal spray Place 1 spray into both nostrils daily. 16 g 2 12/27/2022   furosemide (LASIX) 20 MG tablet Take by mouth.   12/27/2022 at AM   Ipratropium-Albuterol (COMBIVENT) 20-100 MCG/ACT AERS respimat Inhale into the lungs.   12/27/2022 at 1600   levothyroxine (SYNTHROID) 150 MCG tablet Take 1 tablet by mouth daily.   12/27/2022   montelukast (SINGULAIR) 10 MG tablet Take 10 mg by mouth daily.   12/27/2022   pantoprazole (PROTONIX) 40 MG tablet Take 1 tablet (40 mg total) by mouth daily. 90 tablet 0 12/27/2022   predniSONE (DELTASONE) 5 MG tablet Take 5 mg by mouth daily.   12/27/2022   prochlorperazine (COMPAZINE) 10 MG tablet  Take 1 tablet (10 mg total) by mouth every 6 (six) hours as needed for nausea or vomiting. 60 tablet 2 12/27/2022 at PRN   rosuvastatin (CRESTOR) 10 MG tablet    12/27/2022   tamsulosin (FLOMAX) 0.4 MG CAPS capsule Take 1 capsule (0.4 mg total) by mouth daily. 90 capsule 3 12/27/2022   traZODone (DESYREL) 100 MG tablet Take 100 mg by mouth at bedtime.   Past Week at PRN   acetaminophen (TYLENOL) 500 MG tablet Take 1,000 mg by mouth every 6 (six) hours as needed for moderate pain.   PRN at PRN   Azelastine HCl 137 MCG/SPRAY SOLN SMARTSIG:1-2 Spray(s) Both Nares Twice Daily      diphenhydrAMINE-zinc acetate (BENADRYL EXTRA STRENGTH) cream Apply 1 application topically 3 (three) times daily as needed for itching. 28 g 0    dupilumab (DUPIXENT) 200 MG/1. prefilled syringe Inject 200 mg into the skin every 14 (fourteen) days. (Patient not taking: Reported on 12/28/2022)   Not Taking   EPINEPHrine 0.3 mg/0.3 mL IJ SOAJ injection Inject 0.3 mg into the muscle as needed.   PRN at PRN   levothyroxine (SYNTHROID) 175 MCG tablet  (Patient not taking: Reported on 12/28/2022)   Not Taking   lidocaine-prilocaine (EMLA) cream Apply  over port 1-2 hours prior to chemotherapy treatment.  Cover with plastic wrap. 30 g 1    Melatonin 10 MG TABS Take 10 mg by mouth at bedtime.       Multiple Vitamin (MULTI-VITAMINS) TABS Take 1 tablet by mouth daily.       ondansetron (ZOFRAN) 8 MG tablet Take 1 tablet (8 mg total) by mouth every 12 (twelve) hours as needed for nausea or vomiting. 60 tablet 0    sertraline (ZOLOFT) 25 MG tablet TAKE 1 TABLET(25 MG) BY MOUTH DAILY (Patient not taking: Reported on 12/28/2022) 90 tablet 0 Not Taking   tadalafil (CIALIS) 5 MG tablet Take 5-10mg  by mouth as needed prior to intercourse 30 tablet 11    triamcinolone ointment (KENALOG) 0.5 % Apply 1 application topically 3 (three) times daily as needed. 30 g 1     Assessment: Pharmacy consulted on this 77 year old male admitted with ACS/NSTEMI. No prior anticoag noted.  CrCl = 44.8 ml/min  Goal of Therapy:  HL :  0.3 - 0.7  Monitor platelets by anticoagulation protocol: Yes    8/1@1103 : HL 0.26, subtherapeutic  Plan:  - Give 1500 units bolus x 1 - Increase heparin infusion to 1550 units/hr - Recheck anti-Xa level in 8 hours and daily while on heparin - Continue to monitor H&H and platelets  Bettey Costa, PharmD Clinical Pharmacist 12/28/2022 11:55 AM

## 2022-12-29 ENCOUNTER — Other Ambulatory Visit: Payer: Self-pay

## 2022-12-29 ENCOUNTER — Encounter
Admission: EM | Disposition: A | Discharge status: Home / Self Care | Payer: Self-pay | Source: Home / Self Care | Attending: Hospitalist

## 2022-12-29 ENCOUNTER — Encounter: Payer: Self-pay | Admitting: Cardiology

## 2022-12-29 ENCOUNTER — Inpatient Hospital Stay: Payer: HMO

## 2022-12-29 DIAGNOSIS — I214 Non-ST elevation (NSTEMI) myocardial infarction: Secondary | ICD-10-CM | POA: Diagnosis not present

## 2022-12-29 HISTORY — PX: LEFT HEART CATH AND CORONARY ANGIOGRAPHY: CATH118249

## 2022-12-29 HISTORY — PX: CORONARY STENT INTERVENTION: CATH118234

## 2022-12-29 LAB — POCT ACTIVATED CLOTTING TIME: Activated Clotting Time: 311 seconds

## 2022-12-29 SURGERY — LEFT HEART CATH AND CORONARY ANGIOGRAPHY
Anesthesia: Moderate Sedation

## 2022-12-29 MED ORDER — SODIUM CHLORIDE 0.9% FLUSH
3.0000 mL | Freq: Two times a day (BID) | INTRAVENOUS | Status: DC
  Administered 2022-12-29 – 2022-12-30 (×2): 3 mL via INTRAVENOUS

## 2022-12-29 MED ORDER — SODIUM CHLORIDE 0.9% FLUSH: 3.0000 mL | INTRAVENOUS | Status: DC | PRN

## 2022-12-29 MED ORDER — HYDRALAZINE HCL 20 MG/ML IJ SOLN: 10.0000 mg | INTRAMUSCULAR | Status: AC | PRN

## 2022-12-29 MED ORDER — LABETALOL HCL 5 MG/ML IV SOLN: 10.0000 mg | INTRAVENOUS | Status: AC | PRN

## 2022-12-29 MED ORDER — PRASUGREL HCL 10 MG PO TABS
10.0000 mg | ORAL_TABLET | Freq: Every day | ORAL | Status: DC
  Administered 2022-12-30: 10 mg via ORAL
  Filled 2022-12-29: qty 1

## 2022-12-29 MED ORDER — FUROSEMIDE 10 MG/ML IJ SOLN
INTRAMUSCULAR | Status: AC
  Filled 2022-12-29: qty 4

## 2022-12-29 MED ORDER — HEPARIN SODIUM (PORCINE) 1000 UNIT/ML IJ SOLN
INTRAMUSCULAR | Status: AC
  Filled 2022-12-29: qty 10

## 2022-12-29 MED ORDER — SODIUM CHLORIDE 0.9 % IV SOLN: 250.0000 mL | INTRAVENOUS | Status: DC | PRN

## 2022-12-29 MED ORDER — PREDNISONE 10 MG PO TABS
5.0000 mg | ORAL_TABLET | Freq: Every day | ORAL | Status: DC
  Administered 2022-12-29 – 2022-12-30 (×2): 5 mg via ORAL
  Filled 2022-12-29 (×2): qty 1

## 2022-12-29 MED ORDER — ONDANSETRON HCL 4 MG/2ML IJ SOLN: 4.0000 mg | Freq: Four times a day (QID) | INTRAMUSCULAR | Status: DC | PRN

## 2022-12-29 MED ORDER — SODIUM CHLORIDE 0.9 % WEIGHT BASED INFUSION
1.0000 mL/kg/h | INTRAVENOUS | Status: AC
  Administered 2022-12-29: 1 mL/kg/h via INTRAVENOUS

## 2022-12-29 MED ORDER — FUROSEMIDE 10 MG/ML IJ SOLN
INTRAMUSCULAR | Status: AC
  Filled 2022-12-29: qty 2

## 2022-12-29 MED ORDER — HEPARIN (PORCINE) IN NACL 1000-0.9 UT/500ML-% IV SOLN
INTRAVENOUS | Status: AC
  Filled 2022-12-29: qty 1000

## 2022-12-29 MED ORDER — NITROGLYCERIN 1 MG/10 ML FOR IR/CATH LAB
INTRA_ARTERIAL | Status: DC | PRN
  Administered 2022-12-29: 200 ug via INTRACORONARY

## 2022-12-29 MED ORDER — IOHEXOL 300 MG/ML  SOLN
INTRAMUSCULAR | Status: DC | PRN
  Administered 2022-12-29: 295 mL

## 2022-12-29 MED ORDER — VERAPAMIL HCL 2.5 MG/ML IV SOLN
INTRAVENOUS | Status: DC | PRN
  Administered 2022-12-29: 2.5 mg via INTRA_ARTERIAL

## 2022-12-29 MED ORDER — FENTANYL CITRATE (PF) 100 MCG/2ML IJ SOLN
INTRAMUSCULAR | Status: DC | PRN
  Administered 2022-12-29: 25 ug via INTRAVENOUS

## 2022-12-29 MED ORDER — PRASUGREL HCL 10 MG PO TABS
ORAL_TABLET | ORAL | Status: AC
  Filled 2022-12-29: qty 6

## 2022-12-29 MED ORDER — PRASUGREL HCL 10 MG PO TABS
ORAL_TABLET | ORAL | Status: DC | PRN
  Administered 2022-12-29: 60 mg via ORAL

## 2022-12-29 MED ORDER — ENOXAPARIN SODIUM 60 MG/0.6ML IJ SOSY
0.5000 mg/kg | PREFILLED_SYRINGE | INTRAMUSCULAR | Status: DC
  Administered 2022-12-29: 55 mg via SUBCUTANEOUS
  Filled 2022-12-29: qty 0.6

## 2022-12-29 MED ORDER — LIDOCAINE HCL (PF) 1 % IJ SOLN
INTRAMUSCULAR | Status: DC | PRN
  Administered 2022-12-29: 2 mL

## 2022-12-29 MED ORDER — FENTANYL CITRATE (PF) 100 MCG/2ML IJ SOLN
INTRAMUSCULAR | Status: AC
  Filled 2022-12-29: qty 2

## 2022-12-29 MED ORDER — ASPIRIN 81 MG PO CHEW
CHEWABLE_TABLET | ORAL | Status: AC
  Filled 2022-12-29: qty 4

## 2022-12-29 MED ORDER — NITROGLYCERIN 1 MG/10 ML FOR IR/CATH LAB
INTRA_ARTERIAL | Status: AC
  Filled 2022-12-29: qty 10

## 2022-12-29 MED ORDER — MIDAZOLAM HCL 2 MG/2ML IJ SOLN
INTRAMUSCULAR | Status: AC
  Filled 2022-12-29: qty 2

## 2022-12-29 MED ORDER — HEPARIN SODIUM (PORCINE) 1000 UNIT/ML IJ SOLN
INTRAMUSCULAR | Status: DC | PRN
  Administered 2022-12-29: 7000 [IU] via INTRAVENOUS
  Administered 2022-12-29: 5000 [IU] via INTRAVENOUS

## 2022-12-29 MED ORDER — ASPIRIN 81 MG PO CHEW
CHEWABLE_TABLET | ORAL | Status: DC | PRN
  Administered 2022-12-29: 324 mg via ORAL

## 2022-12-29 MED ORDER — MIDAZOLAM HCL 2 MG/2ML IJ SOLN
INTRAMUSCULAR | Status: DC | PRN
  Administered 2022-12-29: 1 mg via INTRAVENOUS

## 2022-12-29 MED ORDER — ACETAMINOPHEN 325 MG PO TABS: 650.0000 mg | ORAL_TABLET | ORAL | Status: DC | PRN

## 2022-12-29 MED ORDER — ASPIRIN 81 MG PO CHEW
81.0000 mg | CHEWABLE_TABLET | Freq: Every day | ORAL | Status: DC
  Administered 2022-12-30: 81 mg via ORAL
  Filled 2022-12-29: qty 1

## 2022-12-29 MED ORDER — VERAPAMIL HCL 2.5 MG/ML IV SOLN
INTRAVENOUS | Status: AC
  Filled 2022-12-29: qty 2

## 2022-12-29 MED ORDER — FUROSEMIDE 10 MG/ML IJ SOLN
60.0000 mg | Freq: Once | INTRAMUSCULAR | Status: AC
  Administered 2022-12-29: 60 mg via INTRAVENOUS

## 2022-12-29 MED ORDER — LIDOCAINE HCL 1 % IJ SOLN
INTRAMUSCULAR | Status: AC
  Filled 2022-12-29: qty 20

## 2022-12-29 MED ORDER — HEPARIN (PORCINE) IN NACL 2000-0.9 UNIT/L-% IV SOLN
INTRAVENOUS | Status: DC | PRN
  Administered 2022-12-29: 1000 mL

## 2022-12-29 SURGICAL SUPPLY — 18 items
BALLN TREK RX 2.75X15 (BALLOONS) ×1
BALLN ~~LOC~~ EUPHORA RX 3.25X15 (BALLOONS) ×1
BALLOON TREK RX 2.75X15 (BALLOONS) IMPLANT
BALLOON ~~LOC~~ EUPHORA RX 3.25X15 (BALLOONS) IMPLANT
CATH 5FR JL3.5 JR4 ANG PIG MP (CATHETERS) IMPLANT
CATH LAUNCHER 6FR EBU3.5 (CATHETERS) IMPLANT
DEVICE RAD TR BAND REGULAR (VASCULAR PRODUCTS) IMPLANT
DRAPE BRACHIAL (DRAPES) IMPLANT
GLIDESHEATH SLEND SS 6F .021 (SHEATH) IMPLANT
GUIDEWIRE INQWIRE 1.5J.035X260 (WIRE) IMPLANT
INQWIRE 1.5J .035X260CM (WIRE) ×1
KIT ENCORE 26 ADVANTAGE (KITS) IMPLANT
PACK CARDIAC CATH (CUSTOM PROCEDURE TRAY) ×1 IMPLANT
PROTECTION STATION PRESSURIZED (MISCELLANEOUS) ×1
SET ATX-X65L (MISCELLANEOUS) IMPLANT
STATION PROTECTION PRESSURIZED (MISCELLANEOUS) IMPLANT
STENT ONYX FRONTIER 3.0X18 (Permanent Stent) IMPLANT
WIRE ASAHI PROWATER 180CM (WIRE) IMPLANT

## 2022-12-29 NOTE — Plan of Care (Signed)

## 2022-12-29 NOTE — Consult Note (Signed)
PHARMACIST - PHYSICIAN COMMUNICATION  CONCERNING:  Enoxaparin (Lovenox) for DVT Prophylaxis    RECOMMENDATION: Patient was prescribed enoxaprin 40mg  q24 hours for VTE prophylaxis.   Filed Weights   12/27/22 2200  Weight: 111.1 kg (245 lb)    Body mass index is 32.32 kg/m.  Estimated Creatinine Clearance: 62.7 mL/min (A) (by C-G formula based on SCr of 1.33 mg/dL (H)).   Based on [facility] policy patient is candidate for enoxaparin 0.5mg /kg TBW SQ every 24 hours based on BMI being >30.   DESCRIPTION: Pharmacy has adjusted enoxaparin dose per [facility] policy.  Patient is now receiving enoxaparin 55 mg every 24 hours    Barrie Folk, PharmD Clinical Pharmacist  12/29/2022 4:37 PM

## 2022-12-29 NOTE — Progress Notes (Addendum)
Given report to Delene Loll, Charity fundraiser. Per Lynden Ang to stop heparin gtt once transport get here to take pt to procedure. Will continue to monitor.  Update 0722: Heparin gtt stopped.

## 2022-12-29 NOTE — Progress Notes (Signed)
ANTICOAGULATION CONSULT NOTE  Pharmacy Consult for Heparin  Indication: chest pain/ACS  Allergies  Allergen Reactions   Morphine And Codeine     Patient Measurements: Height: 6\' 1"  (185.4 cm) Weight: 111.1 kg (245 lb) IBW/kg (Calculated) : 79.9 Heparin Dosing Weight: 103.3 kg   Vital Signs: Temp: 98.5 F (36.9 C) (08/02 0315) BP: 117/60 (08/02 0315) Pulse Rate: 79 (08/02 0315)  Labs: Recent Labs    12/27/22 2203 12/28/22 0045 12/28/22 0227 12/28/22 0802 12/28/22 1103 12/28/22 1331 12/28/22 1534 12/28/22 2038 12/29/22 0513  HGB 11.4*  --   --   --   --   --   --   --  11.1*  HCT 35.6*  --   --   --   --   --   --   --  36.2*  PLT 197  --   --   --   --   --   --   --  190  APTT  --   --  30  --   --   --   --   --   --   LABPROT  --   --  14.1  --   --   --   --   --   --   INR  --   --  1.1  --   --   --   --   --   --   HEPARINUNFRC  --   --   --   --  0.26*  --   --  0.48 0.64  CREATININE 1.86*  --   --  1.46*  --   --   --   --  1.33*  TROPONINIHS 24*   < >  --  142* 105* 89* 81*  --   --    < > = values in this interval not displayed.    Estimated Creatinine Clearance: 62.7 mL/min (A) (by C-G formula based on SCr of 1.33 mg/dL (H)).   Medical History: Past Medical History:  Diagnosis Date   Anxiety    Aortic atherosclerosis (HCC)    Arthritis    SHOULDER   BPH (benign prostatic hyperplasia)    Cataract    Chronic kidney disease    low kidney function   Colon adenomas    Colon cancer (HCC) 2015   Pt states during his colonoscopy he had cancerous polyps removed.    COPD (chronic obstructive pulmonary disease) (HCC)    Depression    SINCE DIAGNOSIS   Dyspnea    DOE   Enlarged prostate    GERD (gastroesophageal reflux disease)    Hypertension    Hypothyroidism    Lung cancer (HCC)    Aug 2018   Pain    DUE TO LUNG ISSUE    Medications:  Medications Prior to Admission  Medication Sig Dispense Refill Last Dose   aspirin EC 81 MG tablet  Take 1 tablet (81 mg total) by mouth daily. Swallow whole. 90 tablet 0 12/27/2022 at AM   BREZTRI AEROSPHERE 160-9-4.8 MCG/ACT AERO Inhale 2 puffs into the lungs 2 (two) times daily.   12/27/2022 at 1600   calcium-vitamin D (OSCAL WITH D) 500-200 MG-UNIT tablet Take 1 tablet by mouth daily. 90 tablet 1 12/27/2022   DUPIXENT 300 MG/2ML SOPN Inject 300 mg into the skin every 14 (fourteen) days.      fluticasone (FLONASE) 50 MCG/ACT nasal spray Place 1 spray into both nostrils daily. 16 g  2 12/27/2022   furosemide (LASIX) 20 MG tablet Take by mouth.   12/27/2022 at AM   Ipratropium-Albuterol (COMBIVENT) 20-100 MCG/ACT AERS respimat Inhale into the lungs.   12/27/2022 at 1600   levothyroxine (SYNTHROID) 150 MCG tablet Take 1 tablet by mouth daily.   12/27/2022   montelukast (SINGULAIR) 10 MG tablet Take 10 mg by mouth daily.   12/27/2022   pantoprazole (PROTONIX) 40 MG tablet Take 1 tablet (40 mg total) by mouth daily. 90 tablet 0 12/27/2022   predniSONE (DELTASONE) 5 MG tablet Take 5 mg by mouth daily.   12/27/2022   prochlorperazine (COMPAZINE) 10 MG tablet Take 1 tablet (10 mg total) by mouth every 6 (six) hours as needed for nausea or vomiting. 60 tablet 2 12/27/2022 at PRN   rosuvastatin (CRESTOR) 10 MG tablet    12/27/2022   tamsulosin (FLOMAX) 0.4 MG CAPS capsule Take 1 capsule (0.4 mg total) by mouth daily. 90 capsule 3 12/27/2022   traZODone (DESYREL) 100 MG tablet Take 100 mg by mouth at bedtime.   Past Week at PRN   acetaminophen (TYLENOL) 500 MG tablet Take 1,000 mg by mouth every 6 (six) hours as needed for moderate pain.   PRN at PRN   Azelastine HCl 137 MCG/SPRAY SOLN SMARTSIG:1-2 Spray(s) Both Nares Twice Daily      diphenhydrAMINE-zinc acetate (BENADRYL EXTRA STRENGTH) cream Apply 1 application topically 3 (three) times daily as needed for itching. 28 g 0    dupilumab (DUPIXENT) 200 MG/1. prefilled syringe Inject 200 mg into the skin every 14 (fourteen) days. (Patient not taking: Reported on  12/28/2022)   Not Taking   EPINEPHrine 0.3 mg/0.3 mL IJ SOAJ injection Inject 0.3 mg into the muscle as needed.   PRN at PRN   levothyroxine (SYNTHROID) 175 MCG tablet  (Patient not taking: Reported on 12/28/2022)   Not Taking   lidocaine-prilocaine (EMLA) cream Apply over port 1-2 hours prior to chemotherapy treatment.  Cover with plastic wrap. 30 g 1    Melatonin 10 MG TABS Take 10 mg by mouth at bedtime.       Multiple Vitamin (MULTI-VITAMINS) TABS Take 1 tablet by mouth daily.       ondansetron (ZOFRAN) 8 MG tablet Take 1 tablet (8 mg total) by mouth every 12 (twelve) hours as needed for nausea or vomiting. 60 tablet 0    sertraline (ZOLOFT) 25 MG tablet TAKE 1 TABLET(25 MG) BY MOUTH DAILY (Patient not taking: Reported on 12/28/2022) 90 tablet 0 Not Taking   tadalafil (CIALIS) 5 MG tablet Take 5-10mg  by mouth as needed prior to intercourse 30 tablet 11    triamcinolone ointment (KENALOG) 0.5 % Apply 1 application topically 3 (three) times daily as needed. 30 g 1     Assessment: Pharmacy consulted on this 77 year old male admitted with ACS/NSTEMI. No prior anticoag noted.  CrCl = 44.8 ml/min  Goal of Therapy:  HL :  0.3 - 0.7  Monitor platelets by anticoagulation protocol: Yes    8/1@1103 : HL 0.26, subtherapeutic 8/01 2039 HL 0.48, therapeutic x 1 8/02 0513 HL 0.64, therapeutic X 2  Plan:  - Continue heparin infusion at 1550 units/hr - Recheck HL w/ AM lab to confirm, then daily while on heparin - Continue to monitor H&H and platelets  , D 12/29/2022 6:21 AM

## 2022-12-29 NOTE — Plan of Care (Signed)
  Problem: Education: Goal: Knowledge of General Education information will improve Description: Including pain rating scale, medication(s)/side effects and non-pharmacologic comfort measures 12/29/2022 0233 by Myles Gip, RN Outcome: Progressing 12/29/2022 0127 by Myles Gip, RN Outcome: Progressing   Problem: Clinical Measurements: Goal: Will remain free from infection Outcome: Progressing   Problem: Clinical Measurements: Goal: Respiratory complications will improve 12/29/2022 0233 by Myles Gip, RN Outcome: Progressing 12/29/2022 0127 by Myles Gip, RN Outcome: Progressing   Problem: Clinical Measurements: Goal: Cardiovascular complication will be avoided 12/29/2022 0233 by Myles Gip, RN Outcome: Progressing 12/29/2022 0127 by Myles Gip, RN Outcome: Progressing   Problem: Activity: Goal: Risk for activity intolerance will decrease Outcome: Progressing   Problem: Pain Managment: Goal: General experience of comfort will improve 12/29/2022 0233 by Myles Gip, RN Outcome: Progressing 12/29/2022 0127 by Myles Gip, RN Outcome: Progressing   Problem: Safety: Goal: Ability to remain free from injury will improve 12/29/2022 0233 by Myles Gip, RN Outcome: Progressing 12/29/2022 0127 by Myles Gip, RN Outcome: Progressing

## 2022-12-29 NOTE — Progress Notes (Signed)
Patient experiencing difficulty breathing. Messaged MD. Stated pt condition different than baseline pre-procedure. MD ordered increased o2 and IV lasix, and chest x-ray. Patient very agitated so did not deflate TR band during this time. MD at bedside.

## 2022-12-29 NOTE — Progress Notes (Signed)
  PROGRESS NOTE    Marc Gray  ZOX:[id number] DOB: 1946-03-31 DOA: 12/27/2022 PCP: [name], MD  [id number]  LOS: 1 day   Brief hospital course:   Assessment & Plan: Jaegar Croft Gray is a 77 y.o. 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    * NSTEMI (non-ST elevated myocardial infarction) (HCC) CAD --trop peaked at 142.  Started on heparin gtt --cardiac cath today, 90% prox LAD, received DES  --cont ASA and Effient --cont statin --cont Lopressor --f/u with Dr Juliann Pares 1-2 weeks   Respiratory distress --pt had dyspnea after cardiac cath.  Did received IVF prior to the cath. 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  Afterwards, pt complained of dyspnea.  No chest  pain.   Objective: Vitals:   12/29/22 1130 12/29/22 1200 12/29/22 1230 12/29/22 1300  BP: 128/89 139/84 128/88 137/70  Pulse: 81 71 78 77  Resp: (!) 27  (!) 24   Temp:      TempSrc:      SpO2: 99% 99% 100% 98%  Weight:      Height:        Intake/Output Summary (Last 24 hours) at 12/29/2022 1607 Last data filed at 12/29/2022 1503 Gross per 24 hour  Intake 2196 ml  Output 700 ml  Net 1496 ml   Filed Weights   12/27/22 2200  Weight: 111.1 kg    Examination:   Constitutional: NAD, AAOx3 HEENT: conjunctivae and lids normal, EOMI CV: No cyanosis.   RESP: clear lung sounds SKIN: warm, dry Neuro: II - XII grossly intact.   Psych: Normal mood and affect.  Appropriate judgement and reason   Data Reviewed: I have personally reviewed labs and imaging studies  Time spent: 50 minutes  Darlin Priestly, MD [facility] If 7PM-7AM, please contact night-coverage 12/29/2022, 4:07 PM

## 2022-12-30 ENCOUNTER — Other Ambulatory Visit: Payer: Self-pay

## 2022-12-30 DIAGNOSIS — I214 Non-ST elevation (NSTEMI) myocardial infarction: Secondary | ICD-10-CM | POA: Diagnosis not present

## 2022-12-30 MED ORDER — ATORVASTATIN CALCIUM 80 MG PO TABS
80.0000 mg | ORAL_TABLET | Freq: Every day | ORAL | Status: DC
  Administered 2022-12-30: 80 mg via ORAL
  Filled 2022-12-30: qty 1

## 2022-12-30 MED ORDER — PRASUGREL HCL 10 MG PO TABS: 10.0000 mg | ORAL_TABLET | Freq: Every day | ORAL | 11 refills | Status: AC

## 2022-12-30 MED ORDER — METOPROLOL TARTRATE 25 MG PO TABS: 12.5000 mg | ORAL_TABLET | Freq: Two times a day (BID) | ORAL | 2 refills | Status: DC

## 2022-12-30 NOTE — Progress Notes (Signed)
[facility] Cardiology  SUBJECTIVE: Patient laying in bed, denies chest pain or shortness of breath   Vitals:   12/29/22 2025 12/30/22 0025 12/30/22 0535 12/30/22 0606  BP: 96/66 106/68 (!) 143/87   Pulse:  72 72   Resp:  20 18   Temp:  98.6 F (37 C) 98.6 F (37 C)   TempSrc:  Oral Oral   SpO2:  95% 96% 96%  Weight:      Height:         Intake/Output Summary (Last 24 hours) at 12/30/2022 3474 Last data filed at 12/29/2022 1503 Gross per 24 hour  Intake 1816.52 ml  Output 700 ml  Net 1116.52 ml      PHYSICAL EXAM  General: Well developed, well nourished, in no acute distress HEENT:  Normocephalic and atramatic Neck:  No JVD.  Lungs: Clear bilaterally to auscultation and percussion. Heart: HRRR . Normal S1 and S2 without gallops or murmurs.  Abdomen: Bowel sounds are positive, abdomen soft and non-tender  Msk:  Back normal, normal gait. Normal strength and tone for age. Extremities: No clubbing, cyanosis or edema.   Neuro: Alert and oriented X 3. Psych:  Good affect, responds appropriately   LABS: Basic Metabolic Panel: Recent Labs    12/29/22 0513 12/30/22 0552  NA 141 136  K 4.6 4.0  CL 109 104  CO2 24 22  GLUCOSE 91 83  BUN 33* 30*  CREATININE 1.33* 1.53*  CALCIUM 8.5* 8.8*  MG 2.1 2.3   Liver Function Tests: Recent Labs    12/27/22 2203  AST 27  ALT 28  ALKPHOS 37*  BILITOT 0.5  PROT 6.8  ALBUMIN 3.9   No results for input(s): "LIPASE", "AMYLASE" in the last 72 hours. CBC: Recent Labs    12/27/22 2203 12/29/22 0513 12/30/22 0552  WBC 8.0 5.9 7.0  NEUTROABS 6.4  --   --   HGB 11.4* 11.1* 11.3*  HCT 35.6* 36.2* 35.7*  MCV 85.4 87.0 85.4  PLT 197 190 192   Cardiac Enzymes: No results for input(s): "CKTOTAL", "CKMB", "CKMBINDEX", "TROPONINI" in the last 72 hours. BNP: Invalid input(s): "POCBNP" D-Dimer: Recent Labs    12/28/22 0620  DDIMER 0.95*   Hemoglobin A1C: Recent Labs    12/28/22 1331  HGBA1C 5.8*   Fasting Lipid  Panel: Recent Labs    12/28/22 1103  CHOL 128  HDL 54  LDLCALC 59  TRIG 74  CHOLHDL 2.4   Thyroid Function Tests: No results for input(s): "TSH", "T4TOTAL", "T3FREE", "THYROIDAB" in the last 72 hours.  Invalid input(s): "FREET3" Anemia Panel: No results for input(s): "VITAMINB12", "FOLATE", "FERRITIN", "TIBC", "IRON", "RETICCTPCT" in the last 72 hours.  DG Chest Port 1 View  Result Date: 12/29/2022 CLINICAL DATA:  Dyspnea.  Cardiac catheterization this morning. EXAM: PORTABLE CHEST 1 VIEW COMPARISON:  Radiograph 12/27/2022. FINDINGS: The patient is rotated. Stable heart size. Small left pleural effusion. Left paratracheal density may be related to rotation or tracking pleural effusion. No pneumothorax or confluent airspace disease. No pulmonary edema. Multilevel vertebroplasty in the thoracic spine. IMPRESSION: 1. Small left pleural effusion. 2. Left paratracheal density may be related to rotation or tracking pleural effusion. This could be further assessed with CT as clinically indicated. Electronically Signed   By: Narda Rutherford M.D.   On: 12/29/2022 15:23   CARDIAC CATHETERIZATION  Result Date: 12/29/2022   Dist RCA lesion is 20% stenosed.   Prox LAD to Mid LAD lesion is 90% stenosed.   A drug-eluting stent  was successfully placed using a STENT ONYX FRONTIER [id number].   Post intervention, there is a 0% residual stenosis.   The left ventricular systolic function is normal.   LV end diastolic pressure is normal.   The left ventricular ejection fraction is 55-65% by visual estimate. 1.  One-vessel coronary artery disease with 90% stenosis proximal/mid LAD 2.  Normal left ventricular function 3.  Successful PCI with 3.0 x 18 mm Onyx frontier stent proximal/mid LAD Recommendations 1.  Dual antiplatelet therapy uninterrupted for 1 year 2.  Aggressive risk factor modification 3.  Follow-up Dr. Juliann Pares with 1 to 2 weeks   ECHOCARDIOGRAM COMPLETE  Result Date: 12/28/2022    ECHOCARDIOGRAM REPORT    Patient Name:   Marc Gray Date of Exam: 12/28/2022 Medical Rec #:  [id number]                    Height:       73.0 in Accession #:    [id number]                   Weight:       245.0 lb Date of Birth:  Oct 03, 1945                     BSA:          2.345 m Patient Age:    77 years                     BP:           150/88 mmHg Patient Gender: M                            HR:           63 bpm. Exam Location:  [facility] Procedure: 2D Echo, Cardiac Doppler, Color Doppler and Strain Analysis Indications:     Chest Pain  History:         Patient has no prior history of Echocardiogram examinations.                  Acute MI; Signs/Symptoms:Chest Pain and Shortness of Breath.  Sonographer:     Mikki Harbor Referring Phys:  [id number] LILY MICHELLE TANG Diagnosing Phys: Marcina Millard MD  Sonographer Comments: Global longitudinal strain was attempted. IMPRESSIONS  1. Left ventricular ejection fraction, by estimation, is 55 to 60%. The left ventricle has normal function. The left ventricle has no regional wall motion abnormalities. Left ventricular diastolic parameters were normal.  2. Right ventricular systolic function is normal. The right ventricular size is normal.  3. The mitral valve is normal in structure. Trivial mitral valve regurgitation. No evidence of mitral stenosis.  4. The aortic valve is normal in structure. Aortic valve regurgitation is not visualized. No aortic stenosis is present.  5. The inferior vena cava is normal in size with greater than 50% respiratory variability, suggesting right atrial pressure of 3 mmHg. FINDINGS  Left Ventricle: Left ventricular ejection fraction, by estimation, is 55 to 60%. The left ventricle has normal function. The left ventricle has no regional wall motion abnormalities. The left ventricular internal cavity size was normal in size. There is  no left ventricular hypertrophy. Left ventricular diastolic parameters were normal. Right Ventricle: The right  ventricular size is normal. No increase in right ventricular wall thickness. Right ventricular systolic function is normal. Left Atrium: Left atrial size was  normal in size. Right Atrium: Right atrial size was normal in size. Pericardium: There is no evidence of pericardial effusion. Mitral Valve: The mitral valve is normal in structure. Trivial mitral valve regurgitation. No evidence of mitral valve stenosis. MV peak gradient, 4.1 mmHg. The mean mitral valve gradient is 2.0 mmHg. Tricuspid Valve: The tricuspid valve is normal in structure. Tricuspid valve regurgitation is trivial. No evidence of tricuspid stenosis. Aortic Valve: The aortic valve is normal in structure. Aortic valve regurgitation is not visualized. No aortic stenosis is present. Aortic valve mean gradient measures 3.0 mmHg. Aortic valve peak gradient measures 7.3 mmHg. Aortic valve area, by VTI measures 2.60 cm. Pulmonic Valve: The pulmonic valve was normal in structure. Pulmonic valve regurgitation is not visualized. No evidence of pulmonic stenosis. Aorta: The aortic root is normal in size and structure. Venous: The inferior vena cava is normal in size with greater than 50% respiratory variability, suggesting right atrial pressure of 3 mmHg. IAS/Shunts: No atrial level shunt detected by color flow Doppler.  LEFT VENTRICLE PLAX 2D LVIDd:         4.80 cm     Diastology LVIDs:         2.70 cm     LV e' medial:    5.66 cm/s LV PW:         1.40 cm     LV E/e' medial:  18.2 LV IVS:        1.40 cm     LV e' lateral:   9.79 cm/s LVOT diam:     2.00 cm     LV E/e' lateral: 10.5 LV SV:         79 LV SV Index:   34 LVOT Area:     3.14 cm  LV Volumes (MOD) LV vol d, MOD A2C: 51.5 ml LV vol d, MOD A4C: 77.9 ml LV vol s, MOD A2C: 22.5 ml LV vol s, MOD A4C: 33.5 ml LV SV MOD A2C:     29.0 ml LV SV MOD A4C:     77.9 ml LV SV MOD BP:      37.9 ml RIGHT VENTRICLE RV Basal diam:  3.90 cm RV Mid diam:    3.00 cm RV S prime:     10.90 cm/s LEFT ATRIUM              Index        RIGHT ATRIUM           Index LA diam:        4.00 cm 1.71 cm/m   RA Area:     25.80 cm LA Vol (A2C):   52.8 ml 22.49 ml/m  RA Volume:   84.70 ml  36.11 ml/m LA Vol (A4C):   59.4 ml 25.33 ml/m LA Biplane Vol: 54.9 ml 23.41 ml/m  AORTIC VALVE                    PULMONIC VALVE AV Area (Vmax):    2.65 cm     PV Vmax:       0.94 m/s AV Area (Vmean):   2.88 cm     PV Peak grad:  3.5 mmHg AV Area (VTI):     2.60 cm AV Vmax:           135.00 cm/s AV Vmean:          73.900 cm/s AV VTI:            0.304 m AV  Peak Grad:      7.3 mmHg AV Mean Grad:      3.0 mmHg LVOT Vmax:         114.00 cm/s LVOT Vmean:        67.700 cm/s LVOT VTI:          0.252 m LVOT/AV VTI ratio: 0.83  AORTA Ao Root diam: 3.90 cm MITRAL VALVE MV Area (PHT): 3.91 cm     SHUNTS MV Area VTI:   1.97 cm     Systemic VTI:  0.25 m MV Peak grad:  4.1 mmHg     Systemic Diam: 2.00 cm MV Mean grad:  2.0 mmHg MV Vmax:       1.01 m/s MV Vmean:      66.1 cm/s MV Decel Time: 194 msec MV E velocity: 103.00 cm/s MV A velocity: 94.90 cm/s MV E/A ratio:  1.09 Marcina Millard MD Electronically signed by Marcina Millard MD Signature Date/Time: 12/28/2022/1:31:17 PM    Final      Echo LVEF 55-60% 12/28/2022  TELEMETRY: Sinus rhythm:  ASSESSMENT AND PLAN:  Principal Problem:   NSTEMI (non-ST elevated myocardial infarction) (HCC) Active Problems:   Adenocarcinoma of left lung, stage 3 (HCC)   Hypothyroidism due to medication   Adrenal insufficiency (HCC)   MGUS (monoclonal gammopathy of unknown significance)   History of prostate cancer   Stage 3a chronic kidney disease (HCC)    1.  NSTEMI (high-sensitivity troponin 24, 100, 142, 105), cardiac catheterization 12/29/2022 revealed high-grade 90% stenosis proximal LAD, underwent successful PCI 3.0 x 18 mm Onyx frontier DES proximal/mid LAD 2.  Hyperlipidemia, on rosuvastatin 3.  COPD 4.  History of adenocarcinoma left lung status postchemotherapy and XRT 5.  CKD stage IIIb, BUN and  creatinine stable following cardiac catheterization and PCI  Recommendations  1.  Dual antiplatelet therapy uninterrupted x 1 year 2.  Continue metoprolol tartrate 12.5 mg twice daily 3.  Uptitrate atorvastatin 80 mg daily 4.  May discharge home 5.  Cardiac rehab 6.  Follow-up with me 1 to 2 weeks   Marcina Millard, MD, PhD, [facility] 12/30/2022 8:11 AM

## 2022-12-30 NOTE — Progress Notes (Signed)
Pt refused bed alarm but was educated about safety.. Will continue to monitor. 

## 2022-12-30 NOTE — Plan of Care (Signed)
  Problem: Education: Goal: Knowledge of General Education information will improve Description: Including pain rating scale, medication(s)/side effects and non-pharmacologic comfort measures Outcome: Progressing   Problem: Clinical Measurements: Goal: Respiratory complications will improve Outcome: Progressing   Problem: Clinical Measurements: Goal: Cardiovascular complication will be avoided Outcome: Progressing   Problem: Activity: Goal: Risk for activity intolerance will decrease Outcome: Progressing   Problem: Elimination: Goal: Will not experience complications related to bowel motility Outcome: Progressing   Problem: Elimination: Goal: Will not experience complications related to urinary retention Outcome: Progressing   Problem: Pain Managment: Goal: General experience of comfort will improve Outcome: Progressing   Problem: Safety: Goal: Ability to remain free from injury will improve Outcome: Progressing

## 2022-12-30 NOTE — Discharge Summary (Signed)
Physician Discharge Summary   Marc Gray  male DOB: 18-Jan-1946  RUE:[id number]  PCP: [name], MD  Admit date: 12/27/2022 Discharge date: 12/30/2022  Admitted From: home Disposition:  home Wife updated at bedside prior to discharge. CODE STATUS: Full code  Discharge Instructions     AMB Referral to Cardiac Rehabilitation - Phase II   Complete by: As directed    Diagnosis: Coronary Stents   After initial evaluation and assessments completed: Virtual Based Care may be provided alone or in conjunction with Phase 2 Cardiac Rehab based on patient barriers.: Yes   Intensive Cardiac Rehabilitation (ICR) [facility] location only OR Traditional Cardiac Rehabilitation (TCR) *If criteria for ICR are not met will enroll in TCR [facility] only): Yes   Diet - low sodium heart healthy   Complete by: As directed    Discharge instructions   Complete by: As directed    You have a new cardiac stent.  Please take aspirin 81 mg and Effient together for 1 year.  Cardiologist has started you on Lopressor. [facility] Course:  For full details, please see H&P, progress notes, consult notes and ancillary notes.  Briefly,  Marc Gray is a 76 y.o. 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    * NSTEMI (non-ST elevated myocardial infarction) (HCC) CAD --trop peaked at 142.  Started on heparin gtt --cardiac cath found 90% prox LAD, received DES  --discharged on ASA and Effient together for 1 year. --cont home Crestor 10 mg daily (LDL 59) --started on Lopressor --f/u with Dr Darrold Junker 1-2 weeks     Respiratory distress --pt had dyspnea after cardiac cath.  Did received IVF prior to the cath. --CXR showed some new small pleural effusion --received IV Lasix 60 mg x1.  Dyspnea resolved next day.   Stage 3a chronic kidney disease (HCC) Renal function at or near baseline   History of prostate cancer Followed by urology   MGUS (monoclonal gammopathy of unknown significance) Followed by oncology   Adrenal insufficiency (HCC) Secondary to immunotherapy --cont home prednisone   Hypothyroidism due to medication Continue levothyroxine    Adenocarcinoma of left lung, stage 3 (HCC) Completed treatment, chemoradiation and immunotherapy 2020 with no CT evidence of recurrence Last seen by oncology in April 2024   Unless noted above, medications under "STOP" list are ones pt was not taking PTA.  Discharge Diagnoses:  Principal Problem:   NSTEMI (non-ST elevated myocardial infarction) (HCC) Active Problems:   Adenocarcinoma of left lung, stage 3 (HCC)   Hypothyroidism due to medication   Adrenal insufficiency (HCC)   MGUS (monoclonal gammopathy of unknown significance)   History of prostate cancer   Stage 3a chronic kidney disease (HCC)   30 Day Unplanned Readmission Risk Score    Flowsheet Row ED to Hosp-Admission (Current) from 12/27/2022 in [facility]U  30 Day Unplanned Readmission Risk Score (%) 28.82 Filed at 12/30/2022 0800       This score is the patient's risk of an unplanned readmission within 30 days of being discharged (0 -100%). The score is based on dignosis, age, lab  data, medications, orders, and past utilization.   Low:  0-14.9   Medium: 15-21.9   High: 22-29.9   Extreme: 30 and above         Discharge Instructions:  Allergies as of 12/30/2022       Reactions   Morphine And Codeine         Medication List     STOP taking these medications    sertraline 25 MG tablet Commonly known as: ZOLOFT       TAKE these  medications    acetaminophen 500 MG tablet Commonly known as: TYLENOL Take 1,000 mg by mouth every 6 (six) hours as needed for moderate pain.   aspirin EC 81 MG tablet Take 1 tablet (81 mg total) by mouth daily. Swallow whole.   Azelastine HCl 137 MCG/SPRAY Soln SMARTSIG:1-2 Spray(s) Both Nares Twice Daily   Breztri Aerosphere 160-9-4.8 MCG/ACT Aero Generic drug: Budeson-Glycopyrrol-Formoterol Inhale 2 puffs into the lungs 2 (two) times daily.   calcium-vitamin D 500-200 MG-UNIT tablet Commonly known as: OSCAL WITH D Take 1 tablet by mouth daily.   diphenhydrAMINE-zinc acetate cream Commonly known as: Benadryl Extra Strength Apply 1 application topically 3 (three) times daily as needed for itching.   Dupixent 300 MG/2ML Sopn Generic drug: Dupilumab Inject 300 mg into the skin every 14 (fourteen) days. What changed: Another medication with the same name was removed. Continue taking this medication, and follow the directions you see here.   EPINEPHrine 0.3 mg/0.3 mL Soaj injection Commonly known as: EPI-PEN Inject 0.3 mg into the muscle as needed.   fluticasone 50 MCG/ACT nasal spray Commonly known as: FLONASE Place 1 spray into both nostrils daily.   furosemide 20 MG tablet Commonly known as: LASIX Take by mouth.   Ipratropium-Albuterol 20-100 MCG/ACT Aers respimat Commonly known as: COMBIVENT Inhale into the lungs.   levothyroxine 150 MCG tablet Commonly known as: SYNTHROID Take 1 tablet by mouth daily.   lidocaine-prilocaine cream Commonly known as: EMLA Apply over port 1-2 hours prior to chemotherapy treatment.  Cover with plastic wrap.   Melatonin 10 MG Tabs Take 10 mg by mouth at bedtime.   metoprolol tartrate 25 MG tablet Commonly known as: LOPRESSOR Take 0.5 tablets (12.5 mg total) by mouth 2 (two) times daily.   montelukast 10 MG tablet Commonly known as: SINGULAIR Take 10 mg by mouth daily.   Multi-Vitamins Tabs Take 1 tablet by mouth daily.    ondansetron 8 MG tablet Commonly known as: ZOFRAN Take 1 tablet (8 mg total) by mouth every 12 (twelve) hours as needed for nausea or vomiting.   pantoprazole 40 MG tablet Commonly known as: Protonix Take 1 tablet (40 mg total) by mouth daily.   prasugrel 10 MG Tabs tablet Commonly known as: EFFIENT Take 1 tablet (10 mg total) by mouth daily.   predniSONE 5 MG tablet Commonly known as: DELTASONE Take 5 mg by mouth daily.   prochlorperazine 10 MG tablet Commonly known as: COMPAZINE Take 1 tablet (10 mg total) by mouth every 6 (six) hours as needed for nausea or vomiting.   rosuvastatin 10 MG tablet Commonly known as: CRESTOR   tadalafil 5 MG tablet Commonly known as: CIALIS Take 5-10mg  by mouth as needed prior to intercourse   tamsulosin 0.4 MG Caps capsule Commonly known as: FLOMAX Take 1 capsule (0.4 mg total) by mouth daily.   traZODone 100 MG tablet Commonly known as: DESYREL Take 100 mg by mouth at bedtime.   triamcinolone ointment 0.5 % Commonly  known as: KENALOG Apply 1 application topically 3 (three) times daily as needed.         Follow-up Information     Paraschos, Alexander, MD Follow up in 1 week(s).   Specialty: Cardiology Contact information: [address] [facility] [place] [address] [phone]                 Allergies  Allergen Reactions   Morphine And Codeine      The results of significant diagnostics from this hospitalization (including imaging, microbiology, ancillary and laboratory) are listed below for reference.   Consultations:   Procedures/Studies: DG Chest Port 1 View  Result Date: 12/29/2022 CLINICAL DATA:  Dyspnea.  Cardiac catheterization this morning. EXAM: PORTABLE CHEST 1 VIEW COMPARISON:  Radiograph 12/27/2022. FINDINGS: The patient is rotated. Stable heart size. Small left pleural effusion. Left paratracheal density may be related to rotation or tracking pleural effusion. No  pneumothorax or confluent airspace disease. No pulmonary edema. Multilevel vertebroplasty in the thoracic spine. IMPRESSION: 1. Small left pleural effusion. 2. Left paratracheal density may be related to rotation or tracking pleural effusion. This could be further assessed with CT as clinically indicated. Electronically Signed   By: Narda Rutherford M.D.   On: 12/29/2022 15:23   CARDIAC CATHETERIZATION  Result Date: 12/29/2022   Dist RCA lesion is 20% stenosed.   Prox LAD to Mid LAD lesion is 90% stenosed.   A drug-eluting stent was successfully placed using a STENT ONYX FRONTIER [id number].   Post intervention, there is a 0% residual stenosis.   The left ventricular systolic function is normal.   LV end diastolic pressure is normal.   The left ventricular ejection fraction is 55-65% by visual estimate. 1.  One-vessel coronary artery disease with 90% stenosis proximal/mid LAD 2.  Normal left ventricular function 3.  Successful PCI with 3.0 x 18 mm Onyx frontier stent proximal/mid LAD Recommendations 1.  Dual antiplatelet therapy uninterrupted for 1 year 2.  Aggressive risk factor modification 3.  Follow-up Dr. Juliann Pares with 1 to 2 weeks   ECHOCARDIOGRAM COMPLETE  Result Date: 12/28/2022    ECHOCARDIOGRAM REPORT   Patient Name:   Medhansh Brinkmeier Gray Date of Exam: 12/28/2022 Medical Rec #:  [id number]                    Height:       73.0 in Accession #:    [id number]                   Weight:       245.0 lb Date of Birth:  April 03, 1946                     BSA:          2.345 m Patient Age:    76 years                     BP:           150/88 mmHg Patient Gender: M                            HR:           63 bpm. Exam Location:  [facility] Procedure: 2D Echo, Cardiac Doppler, Color Doppler and Strain Analysis Indications:     Chest Pain  History:         Patient has  no prior history of Echocardiogram examinations.                  Acute MI; Signs/Symptoms:Chest Pain and Shortness of Breath.  Sonographer:     Mikki Harbor Referring Phys:  [id number] LILY MICHELLE TANG Diagnosing Phys: Marcina Millard MD  Sonographer Comments: Global longitudinal strain was attempted. IMPRESSIONS  1. Left ventricular ejection fraction, by estimation, is 55 to 60%. The left ventricle has normal function. The left ventricle has no regional wall motion abnormalities. Left ventricular diastolic parameters were normal.  2. Right ventricular systolic function is normal. The right ventricular size is normal.  3. The mitral valve is normal in structure. Trivial mitral valve regurgitation. No evidence of mitral stenosis.  4. The aortic valve is normal in structure. Aortic valve regurgitation is not visualized. No aortic stenosis is present.  5. The inferior vena cava is normal in size with greater than 50% respiratory variability, suggesting right atrial pressure of 3 mmHg. FINDINGS  Left Ventricle: Left ventricular ejection fraction, by estimation, is 55 to 60%. The left ventricle has normal function. The left ventricle has no regional wall motion abnormalities. The left ventricular internal cavity size was normal in size. There is  no left ventricular hypertrophy. Left ventricular diastolic parameters were normal. Right Ventricle: The right ventricular size is normal. No increase in right ventricular wall thickness. Right ventricular systolic function is normal. Left Atrium: Left atrial size was normal in size. Right Atrium: Right atrial size was normal in size. Pericardium: There is no evidence of pericardial effusion. Mitral Valve: The mitral valve is normal in structure. Trivial mitral valve regurgitation. No evidence of mitral valve stenosis. MV peak gradient, 4.1 mmHg. The mean mitral valve gradient is 2.0 mmHg. Tricuspid Valve: The tricuspid valve is normal in structure. Tricuspid valve regurgitation is trivial. No evidence of tricuspid stenosis. Aortic Valve: The aortic valve is normal in structure. Aortic valve regurgitation is not  visualized. No aortic stenosis is present. Aortic valve mean gradient measures 3.0 mmHg. Aortic valve peak gradient measures 7.3 mmHg. Aortic valve area, by VTI measures 2.60 cm. Pulmonic Valve: The pulmonic valve was normal in structure. Pulmonic valve regurgitation is not visualized. No evidence of pulmonic stenosis. Aorta: The aortic root is normal in size and structure. Venous: The inferior vena cava is normal in size with greater than 50% respiratory variability, suggesting right atrial pressure of 3 mmHg. IAS/Shunts: No atrial level shunt detected by color flow Doppler.  LEFT VENTRICLE PLAX 2D LVIDd:         4.80 cm     Diastology LVIDs:         2.70 cm     LV e' medial:    5.66 cm/s LV PW:         1.40 cm     LV E/e' medial:  18.2 LV IVS:        1.40 cm     LV e' lateral:   9.79 cm/s LVOT diam:     2.00 cm     LV E/e' lateral: 10.5 LV SV:         79 LV SV Index:   34 LVOT Area:     3.14 cm  LV Volumes (MOD) LV vol d, MOD A2C: 51.5 ml LV vol d, MOD A4C: 77.9 ml LV vol s, MOD A2C: 22.5 ml LV vol s, MOD A4C: 33.5 ml LV SV MOD A2C:     29.0 ml LV SV MOD A4C:  77.9 ml LV SV MOD BP:      37.9 ml RIGHT VENTRICLE RV Basal diam:  3.90 cm RV Mid diam:    3.00 cm RV S prime:     10.90 cm/s LEFT ATRIUM             Index        RIGHT ATRIUM           Index LA diam:        4.00 cm 1.71 cm/m   RA Area:     25.80 cm LA Vol (A2C):   52.8 ml 22.49 ml/m  RA Volume:   84.70 ml  36.11 ml/m LA Vol (A4C):   59.4 ml 25.33 ml/m LA Biplane Vol: 54.9 ml 23.41 ml/m  AORTIC VALVE                    PULMONIC VALVE AV Area (Vmax):    2.65 cm     PV Vmax:       0.94 m/s AV Area (Vmean):   2.88 cm     PV Peak grad:  3.5 mmHg AV Area (VTI):     2.60 cm AV Vmax:           135.00 cm/s AV Vmean:          73.900 cm/s AV VTI:            0.304 m AV Peak Grad:      7.3 mmHg AV Mean Grad:      3.0 mmHg LVOT Vmax:         114.00 cm/s LVOT Vmean:        67.700 cm/s LVOT VTI:          0.252 m LVOT/AV VTI ratio: 0.83  AORTA Ao Root diam:  3.90 cm MITRAL VALVE MV Area (PHT): 3.91 cm     SHUNTS MV Area VTI:   1.97 cm     Systemic VTI:  0.25 m MV Peak grad:  4.1 mmHg     Systemic Diam: 2.00 cm MV Mean grad:  2.0 mmHg MV Vmax:       1.01 m/s MV Vmean:      66.1 cm/s MV Decel Time: 194 msec MV E velocity: 103.00 cm/s MV A velocity: 94.90 cm/s MV E/A ratio:  1.09 Marcina Millard MD Electronically signed by Marcina Millard MD Signature Date/Time: 12/28/2022/1:31:17 PM    Final    DG Chest 1 View  Result Date: 12/27/2022 CLINICAL DATA:  Chest pain. EXAM: CHEST  1 VIEW COMPARISON:  Chest radiograph dated 02/23/2017 and CT dated 03/22/2022. FINDINGS: Shallow inspiration. No focal consolidation, pleural effusion, pneumothorax. Stable cardiac silhouette. Atherosclerotic calcification of the aortic arch. No acute osseous pathology. Thoracic vertebroplasty changes noted. IMPRESSION: No active disease. Electronically Signed   By: Elgie Collard M.D.   On: 12/27/2022 22:57      Labs: BNP (last 3 results) Recent Labs    12/28/22 0045  BNP 152.9*   Basic Metabolic Panel: Recent Labs  Lab 12/27/22 2203 12/28/22 0802 12/29/22 0513 12/30/22 0552  NA 137 139 141 136  K 4.3 4.0 4.6 4.0  CL 107 106 109 104  CO2 23 23 24 22   GLUCOSE 127* 103* 91 83  BUN 46* 41* 33* 30*  CREATININE 1.86* 1.46* 1.33* 1.53*  CALCIUM 8.6* 8.3* 8.5* 8.8*  MG  --   --  2.1 2.3   Liver Function Tests: Recent Labs  Lab 12/27/22 2203  AST  27  ALT 28  ALKPHOS 37*  BILITOT 0.5  PROT 6.8  ALBUMIN 3.9   No results for input(s): "LIPASE", "AMYLASE" in the last 168 hours. No results for input(s): "AMMONIA" in the last 168 hours. CBC: Recent Labs  Lab 12/27/22 2203 12/29/22 0513 12/30/22 0552  WBC 8.0 5.9 7.0  NEUTROABS 6.4  --   --   HGB 11.4* 11.1* 11.3*  HCT 35.6* 36.2* 35.7*  MCV 85.4 87.0 85.4  PLT 197 190 192   Cardiac Enzymes: No results for input(s): "CKTOTAL", "CKMB", "CKMBINDEX", "TROPONINI" in the last 168 hours. BNP: Invalid  input(s): "POCBNP" CBG: No results for input(s): "GLUCAP" in the last 168 hours. D-Dimer Recent Labs    12/28/22 0620  DDIMER 0.95*   Hgb A1c Recent Labs    12/28/22 1331  HGBA1C 5.8*   Lipid Profile Recent Labs    12/28/22 1103  CHOL 128  HDL 54  LDLCALC 59  TRIG 74  CHOLHDL 2.4   Thyroid function studies No results for input(s): "TSH", "T4TOTAL", "T3FREE", "THYROIDAB" in the last 72 hours.  Invalid input(s): "FREET3" Anemia work up No results for input(s): "VITAMINB12", "FOLATE", "FERRITIN", "TIBC", "IRON", "RETICCTPCT" in the last 72 hours. Urinalysis    Component Value Date/Time   COLORURINE YELLOW (A) 02/07/2019 1049   APPEARANCEUR CLEAR (A) 02/07/2019 1049   APPEARANCEUR Clear 02/05/2019 1325   LABSPEC 1.014 02/07/2019 1049   LABSPEC 1.008 09/27/2011 1748   PHURINE 6.0 02/07/2019 1049   GLUCOSEU NEGATIVE 02/07/2019 1049   GLUCOSEU Negative 09/27/2011 1748   HGBUR NEGATIVE 02/07/2019 1049   BILIRUBINUR NEGATIVE 02/07/2019 1049   BILIRUBINUR Negative 02/05/2019 1325   BILIRUBINUR Negative 09/27/2011 1748   KETONESUR NEGATIVE 02/07/2019 1049   PROTEINUR NEGATIVE 02/07/2019 1049   NITRITE NEGATIVE 02/07/2019 1049   LEUKOCYTESUR NEGATIVE 02/07/2019 1049   LEUKOCYTESUR Negative 09/27/2011 1748   Sepsis Labs Recent Labs  Lab 12/27/22 2203 12/29/22 0513 12/30/22 0552  WBC 8.0 5.9 7.0   Microbiology Recent Results (from the past 240 hour(s))  SARS Coronavirus 2 by RT PCR (hospital order, performed in [facility] hospital lab) *cepheid single result test* Anterior Nasal Swab     Status: None   Collection Time: 12/28/22 12:45 AM   Specimen: Anterior Nasal Swab  Result Value Ref Range Status   SARS Coronavirus 2 by RT PCR NEGATIVE NEGATIVE Final    Comment: (NOTE) SARS-CoV-2 target nucleic acids are NOT DETECTED.  The SARS-CoV-2 RNA is generally detectable in upper and lower respiratory specimens during the acute phase of infection. The  lowest concentration of SARS-CoV-2 viral copies this assay can detect is 250 copies / mL. A negative result does not preclude SARS-CoV-2 infection and should not be used as the sole basis for treatment or other patient management decisions.  A negative result may occur with improper specimen collection / handling, submission of specimen other than nasopharyngeal swab, presence of viral mutation(s) within the areas targeted by this assay, and inadequate number of viral copies (<250 copies / mL). A negative result must be combined with clinical observations, patient history, and epidemiological information.  Fact Sheet for Patients:   RoadLapTop.co.za  Fact Sheet for Healthcare Providers: http://kim-miller.com/  This test is not yet approved or  cleared by the [place] FDA and has been authorized for detection and/or diagnosis of SARS-CoV-2 by FDA under an Emergency Use Authorization (EUA).  This EUA will remain in effect (meaning this test can be used) for the duration of the COVID-19 declaration  under Section 564(b)(1) of the Act, 21 U.S.C. section 360bbb-3(b)(1), unless the authorization is terminated or revoked sooner.  Performed at [facility], [address] [place], [place] [address]      Total time spend on discharging this patient, including the last patient exam, discussing the hospital stay, instructions for ongoing care as it relates to all pertinent caregivers, as well as preparing the medical discharge records, prescriptions, and/or referrals as applicable, is 35 minutes.    Darlin Priestly, MD  [facility] 12/30/2022, 8:29 AM

## 2023-01-02 DIAGNOSIS — G4733 Obstructive sleep apnea (adult) (pediatric): Secondary | ICD-10-CM | POA: Diagnosis not present

## 2023-01-03 DIAGNOSIS — J8283 Eosinophilic asthma: Secondary | ICD-10-CM | POA: Diagnosis not present

## 2023-01-03 DIAGNOSIS — I214 Non-ST elevation (NSTEMI) myocardial infarction: Secondary | ICD-10-CM | POA: Diagnosis not present

## 2023-01-03 DIAGNOSIS — R0609 Other forms of dyspnea: Secondary | ICD-10-CM | POA: Diagnosis not present

## 2023-01-09 DIAGNOSIS — E2740 Unspecified adrenocortical insufficiency: Secondary | ICD-10-CM | POA: Diagnosis not present

## 2023-01-09 DIAGNOSIS — R0602 Shortness of breath: Secondary | ICD-10-CM | POA: Diagnosis not present

## 2023-01-09 DIAGNOSIS — I214 Non-ST elevation (NSTEMI) myocardial infarction: Secondary | ICD-10-CM | POA: Diagnosis not present

## 2023-01-09 DIAGNOSIS — C3492 Malignant neoplasm of unspecified part of left bronchus or lung: Secondary | ICD-10-CM | POA: Diagnosis not present

## 2023-01-17 DIAGNOSIS — J4540 Moderate persistent asthma, uncomplicated: Secondary | ICD-10-CM | POA: Diagnosis not present

## 2023-01-17 DIAGNOSIS — J8283 Eosinophilic asthma: Secondary | ICD-10-CM | POA: Diagnosis not present

## 2023-01-18 DIAGNOSIS — N1832 Chronic kidney disease, stage 3b: Secondary | ICD-10-CM | POA: Diagnosis not present

## 2023-01-18 DIAGNOSIS — E032 Hypothyroidism due to medicaments and other exogenous substances: Secondary | ICD-10-CM | POA: Diagnosis not present

## 2023-01-20 DIAGNOSIS — G4733 Obstructive sleep apnea (adult) (pediatric): Secondary | ICD-10-CM | POA: Diagnosis not present

## 2023-01-25 DIAGNOSIS — Z125 Encounter for screening for malignant neoplasm of prostate: Secondary | ICD-10-CM | POA: Diagnosis not present

## 2023-01-25 DIAGNOSIS — K219 Gastro-esophageal reflux disease without esophagitis: Secondary | ICD-10-CM | POA: Diagnosis not present

## 2023-01-25 DIAGNOSIS — I214 Non-ST elevation (NSTEMI) myocardial infarction: Secondary | ICD-10-CM | POA: Diagnosis not present

## 2023-01-25 DIAGNOSIS — E032 Hypothyroidism due to medicaments and other exogenous substances: Secondary | ICD-10-CM | POA: Diagnosis not present

## 2023-01-25 DIAGNOSIS — G8929 Other chronic pain: Secondary | ICD-10-CM | POA: Diagnosis not present

## 2023-01-25 DIAGNOSIS — M545 Low back pain, unspecified: Secondary | ICD-10-CM | POA: Diagnosis not present

## 2023-01-25 DIAGNOSIS — Z6835 Body mass index (BMI) 35.0-35.9, adult: Secondary | ICD-10-CM | POA: Diagnosis not present

## 2023-01-25 DIAGNOSIS — N1831 Chronic kidney disease, stage 3a: Secondary | ICD-10-CM | POA: Diagnosis not present

## 2023-02-01 DIAGNOSIS — J4540 Moderate persistent asthma, uncomplicated: Secondary | ICD-10-CM | POA: Diagnosis not present

## 2023-02-01 DIAGNOSIS — J8283 Eosinophilic asthma: Secondary | ICD-10-CM | POA: Diagnosis not present

## 2023-02-05 ENCOUNTER — Other Ambulatory Visit: Payer: Self-pay

## 2023-02-05 ENCOUNTER — Encounter: Payer: HMO | Attending: Cardiology

## 2023-02-05 DIAGNOSIS — Z955 Presence of coronary angioplasty implant and graft: Secondary | ICD-10-CM | POA: Insufficient documentation

## 2023-02-05 DIAGNOSIS — I214 Non-ST elevation (NSTEMI) myocardial infarction: Secondary | ICD-10-CM | POA: Insufficient documentation

## 2023-02-05 NOTE — Progress Notes (Signed)
Virtual Visit completed. Patient informed on EP and RD appointment and 6 Minute walk test. Patient also informed of patient health questionnaires on My Chart. Patient Verbalizes understanding. Visit diagnosis can be found in [facility] 12/27/2022.

## 2023-02-08 ENCOUNTER — Ambulatory Visit: Payer: HMO | Admitting: Neurosurgery

## 2023-02-14 ENCOUNTER — Encounter: Payer: HMO | Admitting: *Deleted

## 2023-02-14 VITALS — Ht 72.0 in | Wt 249.8 lb

## 2023-02-14 DIAGNOSIS — I214 Non-ST elevation (NSTEMI) myocardial infarction: Secondary | ICD-10-CM

## 2023-02-14 DIAGNOSIS — Z955 Presence of coronary angioplasty implant and graft: Secondary | ICD-10-CM | POA: Diagnosis not present

## 2023-02-14 NOTE — Patient Instructions (Signed)
Patient Instructions  Patient Details  Name: Marc Gray MRN: [id number] Date of Birth: [birth year] Referring Provider:  Marcina Millard, MD  Below are your personal goals for exercise, nutrition, and risk factors. Our goal is to help you stay on track towards obtaining and maintaining these goals. We will be discussing your progress on these goals with you throughout the program.  Initial Exercise Prescription:  Initial Exercise Prescription - 02/14/23 1300       Date of Initial Exercise RX and Referring Provider   Date 02/14/23    Referring Provider Dr. Marcina Millard      Recumbant Bike   Level 1    RPM 50    Watts 15    Minutes 15      NuStep   Level 1    SPM 80    Minutes 15    METs 1.34      REL-XR   Level 1    Speed 50    Minutes 15    METs 1.34      Biostep-RELP   Level 1    SPM 50    Minutes 15    METs 1.34      Track   Laps 10    Minutes 15    METs 1.54      Prescription Details   Frequency (times per week) 3    Duration Progress to 30 minutes of continuous aerobic without signs/symptoms of physical distress      Intensity   THRR 40-80% of Max Heartrate 94-127    Ratings of Perceived Exertion 11-13    Perceived Dyspnea 0-4      Progression   Progression Continue to progress workloads to maintain intensity without signs/symptoms of physical distress.      Resistance Training   Training Prescription Yes    Weight 3    Reps 10-15             Exercise Goals: Frequency: Be able to perform aerobic exercise two to three times per week in program working toward 2-5 days per week of home exercise.  Intensity: Work with a perceived exertion of 11 (fairly light) - 15 (hard) while following your exercise prescription.  We will make changes to your prescription with you as you progress through the program.   Duration: Be able to do 30 to 45 minutes of continuous aerobic exercise in addition to a 5 minute warm-up and a 5  minute cool-down routine.   Nutrition Goals: Your personal nutrition goals will be established when you do your nutrition analysis with the dietician.  The following are general nutrition guidelines to follow: Cholesterol < 200mg /day Sodium < 1500mg /day Fiber: Men over 50 yrs - 30 grams per day  Personal Goals:  Personal Goals and Risk Factors at Admission - 02/14/23 1359       Core Components/Risk Factors/Patient Goals on Admission    Weight Management Yes;Weight Loss    Intervention Weight Management: Develop a combined nutrition and exercise program designed to reach desired caloric intake, while maintaining appropriate intake of nutrient and fiber, sodium and fats, and appropriate energy expenditure required for the weight goal.;Weight Management: Provide education and appropriate resources to help participant work on and attain dietary goals.;Weight Management/Obesity: Establish reasonable short term and long term weight goals.;Obesity: Provide education and appropriate resources to help participant work on and attain dietary goals.    Admit Weight 249 lb 12.8 oz (113.3 kg)    Goal Weight: Short Term 240  lb (108.9 kg)    Goal Weight: Long Term 185 lb (83.9 kg)    Expected Outcomes Short Term: Continue to assess and modify interventions until short term weight is achieved;Long Term: Adherence to nutrition and physical activity/exercise program aimed toward attainment of established weight goal;Understanding recommendations for meals to include 15-35% energy as protein, 25-35% energy from fat, 35-60% energy from carbohydrates, less than 200mg  of dietary cholesterol, 20-35 gm of total fiber daily;Understanding of distribution of calorie intake throughout the day with the consumption of 4-5 meals/snacks;Weight Loss: Understanding of general recommendations for a balanced deficit meal plan, which promotes 1-2 lb weight loss per week and includes a negative energy balance of [phone] kcal/d     Heart Failure Yes    Intervention Provide a combined exercise and nutrition program that is supplemented with education, support and counseling about heart failure. Directed toward relieving symptoms such as shortness of breath, decreased exercise tolerance, and extremity edema.    Expected Outcomes Improve functional capacity of life;Short term: Attendance in program 2-3 days a week with increased exercise capacity. Reported lower sodium intake. Reported increased fruit and vegetable intake. Reports medication compliance.;Short term: Daily weights obtained and reported for increase. Utilizing diuretic protocols set by physician.;Long term: Adoption of self-care skills and reduction of barriers for early signs and symptoms recognition and intervention leading to self-care maintenance.    Hypertension Yes    Intervention Provide education on lifestyle modifcations including regular physical activity/exercise, weight management, moderate sodium restriction and increased consumption of fresh fruit, vegetables, and low fat dairy, alcohol moderation, and smoking cessation.;Monitor prescription use compliance.    Expected Outcomes Short Term: Continued assessment and intervention until BP is < 140/8mm HG in hypertensive participants. < 130/38mm HG in hypertensive participants with diabetes, heart failure or chronic kidney disease.;Long Term: Maintenance of blood pressure at goal levels.    Lipids Yes    Intervention Provide education and support for participant on nutrition & aerobic/resistive exercise along with prescribed medications to achieve LDL 70mg , HDL >40mg .    Expected Outcomes Short Term: Participant states understanding of desired cholesterol values and is compliant with medications prescribed. Participant is following exercise prescription and nutrition guidelines.;Long Term: Cholesterol controlled with medications as prescribed, with individualized exercise RX and with personalized nutrition plan.  Value goals: LDL < 70mg , HDL > 40 mg.             Tobacco Use Initial Evaluation: Social History   Tobacco Use  Smoking Status Former   Current packs/day: 0.00   Average packs/day: 1 pack/day for 53.0 years (53.0 ttl pk-yrs)   Types: Cigarettes   Start date: 04/14/1964   Quit date: 04/14/2017   Years since quitting: 5.8   Passive exposure: Past  Smokeless Tobacco Never  Tobacco Comments   Quit November 2018    Exercise Goals and Review:  Exercise Goals     Row Name 02/14/23 1356             Exercise Goals   Increase Physical Activity Yes       Intervention Provide advice, education, support and counseling about physical activity/exercise needs.;Develop an individualized exercise prescription for aerobic and resistive training based on initial evaluation findings, risk stratification, comorbidities and participant's personal goals.       Expected Outcomes Short Term: Attend rehab on a regular basis to increase amount of physical activity.;Long Term: Add in home exercise to make exercise part of routine and to increase amount of physical activity.;Long Term: Exercising regularly  at least 3-5 days a week.       Increase Strength and Stamina Yes       Intervention Provide advice, education, support and counseling about physical activity/exercise needs.;Develop an individualized exercise prescription for aerobic and resistive training based on initial evaluation findings, risk stratification, comorbidities and participant's personal goals.       Expected Outcomes Short Term: Increase workloads from initial exercise prescription for resistance, speed, and METs.;Short Term: Perform resistance training exercises routinely during rehab and add in resistance training at home;Long Term: Improve cardiorespiratory fitness, muscular endurance and strength as measured by increased METs and functional capacity ( )       Able to understand and use rate of perceived exertion (RPE) scale Yes        Intervention Provide education and explanation on how to use RPE scale       Expected Outcomes Short Term: Able to use RPE daily in rehab to express subjective intensity level;Long Term:  Able to use RPE to guide intensity level when exercising independently       Able to understand and use Dyspnea scale Yes       Intervention Provide education and explanation on how to use Dyspnea scale       Expected Outcomes Short Term: Able to use Dyspnea scale daily in rehab to express subjective sense of shortness of breath during exertion;Long Term: Able to use Dyspnea scale to guide intensity level when exercising independently       Knowledge and understanding of Target Heart Rate Range (THRR) Yes       Intervention Provide education and explanation of THRR including how the numbers were predicted and where they are located for reference       Expected Outcomes Short Term: Able to state/look up THRR;Long Term: Able to use THRR to govern intensity when exercising independently;Short Term: Able to use daily as guideline for intensity in rehab       Able to check pulse independently Yes       Intervention Provide education and demonstration on how to check pulse in carotid and radial arteries.;Review the importance of being able to check your own pulse for safety during independent exercise       Expected Outcomes Short Term: Able to explain why pulse checking is important during independent exercise;Long Term: Able to check pulse independently and accurately       Understanding of Exercise Prescription Yes       Intervention Provide education, explanation, and written materials on patient's individual exercise prescription       Expected Outcomes Short Term: Able to explain program exercise prescription;Long Term: Able to explain home exercise prescription to exercise independently                Copy of goals given to participant.

## 2023-02-14 NOTE — Progress Notes (Signed)
Cardiac Individual Treatment Plan  Patient Details  Name: Marc Gray MRN: [id number] Date of Birth: 07-22-1945 Referring Provider:   Flowsheet Row Cardiac Rehab from 02/14/2023 in [facility] Cardiac and Pulmonary Rehab  Referring Provider Dr. Marcina Millard       Initial Encounter Date:  Flowsheet Row Cardiac Rehab from 02/14/2023 in [facility] Cardiac and Pulmonary Rehab  Date 02/14/23       Visit Diagnosis: Status post coronary artery stent placement  NSTEMI (non-ST elevated myocardial infarction) [facility])  Patient's Home Medications on Admission:  Current Outpatient Medications:    acetaminophen (TYLENOL) 500 MG tablet, Take 1,000 mg by mouth every 6 (six) hours as needed for moderate pain., Disp: , Rfl:    aspirin EC 81 MG tablet, Take 1 tablet (81 mg total) by mouth daily. Swallow whole., Disp: 90 tablet, Rfl: 0   Azelastine HCl 137 MCG/SPRAY SOLN, SMARTSIG:1-2 Spray(s) Both Nares Twice Daily, Disp: , Rfl:    BREZTRI AEROSPHERE 160-9-4.8 MCG/ACT AERO, Inhale 2 puffs into the lungs 2 (two) times daily., Disp: , Rfl:    calcium-vitamin D (OSCAL WITH D) 500-200 MG-UNIT tablet, Take 1 tablet by mouth daily., Disp: 90 tablet, Rfl: 1   diphenhydrAMINE-zinc acetate (BENADRYL EXTRA STRENGTH) cream, Apply 1 application topically 3 (three) times daily as needed for itching., Disp: 28 g, Rfl: 0   DUPIXENT 300 MG/2ML SOPN, Inject 300 mg into the skin every 14 (fourteen) days., Disp: , Rfl:    EPINEPHrine 0.3 mg/0.3 mL IJ SOAJ injection, Inject 0.3 mg into the muscle as needed., Disp: , Rfl:    fluticasone (FLONASE) 50 MCG/ACT nasal spray, Place 1 spray into both nostrils daily., Disp: 16 g, Rfl: 2   furosemide (LASIX) 20 MG tablet, Take by mouth., Disp: , Rfl:    Ipratropium-Albuterol (COMBIVENT) 20-100 MCG/ACT AERS respimat, Inhale into the lungs., Disp: , Rfl:    levothyroxine (SYNTHROID) 150 MCG tablet, Take 1 tablet by mouth daily., Disp: , Rfl:    lidocaine-prilocaine (EMLA)  cream, Apply over port 1-2 hours prior to chemotherapy treatment.  Cover with plastic wrap., Disp: 30 g, Rfl: 1   Melatonin 10 MG TABS, Take 10 mg by mouth at bedtime. , Disp: , Rfl:    metoprolol tartrate (LOPRESSOR) 25 MG tablet, Take 0.5 tablets (12.5 mg total) by mouth 2 (two) times daily., Disp: 30 tablet, Rfl: 2   metoprolol tartrate (LOPRESSOR) 25 MG tablet, Take by mouth. (Patient not taking: Reported on 02/05/2023), Disp: , Rfl:    montelukast (SINGULAIR) 10 MG tablet, Take 10 mg by mouth daily., Disp: , Rfl:    Multiple Vitamin (MULTI-VITAMINS) TABS, Take 1 tablet by mouth daily. , Disp: , Rfl:    ondansetron (ZOFRAN) 8 MG tablet, Take 1 tablet (8 mg total) by mouth every 12 (twelve) hours as needed for nausea or vomiting., Disp: 60 tablet, Rfl: 0   pantoprazole (PROTONIX) 40 MG tablet, Take 1 tablet (40 mg total) by mouth daily., Disp: 90 tablet, Rfl: 0   polyethylene glycol-electrolytes (NULYTELY) 420 g solution, MIX AND DRINK AS DIRECTED, Disp: , Rfl:    prasugrel (EFFIENT) 10 MG TABS tablet, Take 1 tablet (10 mg total) by mouth daily., Disp: 30 tablet, Rfl: 11   prasugrel (EFFIENT) 10 MG TABS tablet, Take 1 tablet by mouth daily. (Patient not taking: Reported on 02/05/2023), Disp: , Rfl:    predniSONE (DELTASONE) 5 MG tablet, Take 5 mg by mouth daily., Disp: , Rfl:    prochlorperazine (COMPAZINE) 10 MG tablet, Take  1 tablet (10 mg total) by mouth every 6 (six) hours as needed for nausea or vomiting., Disp: 60 tablet, Rfl: 2   rosuvastatin (CRESTOR) 10 MG tablet, , Disp: , Rfl:    SHINGRIX injection, , Disp: , Rfl:    SPIKEVAX syringe, , Disp: , Rfl:    tadalafil (CIALIS) 5 MG tablet, Take 5-10mg  by mouth as needed prior to intercourse, Disp: 30 tablet, Rfl: 11   tamsulosin (FLOMAX) 0.4 MG CAPS capsule, Take 1 capsule (0.4 mg total) by mouth daily., Disp: 90 capsule, Rfl: 3   traZODone (DESYREL) 100 MG tablet, Take 100 mg by mouth at bedtime., Disp: , Rfl:    triamcinolone ointment  (KENALOG) 0.5 %, Apply 1 application topically 3 (three) times daily as needed., Disp: 30 g, Rfl: 1 No current facility-administered medications for this visit.  Facility-Administered Medications Ordered in Other Visits:    sodium chloride flush (NS) 0.9 % injection 10 mL, 10 mL, Intravenous, PRN, Rickard Patience, MD, 10 mL at 04/03/17 4098  Past Medical History: Past Medical History:  Diagnosis Date   Anxiety    Aortic atherosclerosis (HCC)    Arthritis    SHOULDER   BPH (benign prostatic hyperplasia)    Cataract    Chronic kidney disease    low kidney function   Colon adenomas    Colon cancer (HCC) 2015   Pt states during his colonoscopy he had cancerous polyps removed.    COPD (chronic obstructive pulmonary disease) (HCC)    Depression    SINCE DIAGNOSIS   Dyspnea    DOE   Enlarged prostate    GERD (gastroesophageal reflux disease)    Hypertension    Hypothyroidism    Lung cancer (HCC)    Aug 2018   Pain    DUE TO LUNG ISSUE    Tobacco Use: Social History   Tobacco Use  Smoking Status Former   Current packs/day: 0.00   Average packs/day: 1 pack/day for 53.0 years (53.0 ttl pk-yrs)   Types: Cigarettes   Start date: 04/14/1964   Quit date: 04/14/2017   Years since quitting: 5.8   Passive exposure: Past  Smokeless Tobacco Never  Tobacco Comments   Quit November 2018    Labs: Review Flowsheet       Latest Ref Rng & Units 12/28/2022  Labs for ITP Cardiac and Pulmonary Rehab  Cholestrol 0 - 200 mg/dL 119   LDL (calc) 0 - 99 mg/dL 59   HDL-C >14 mg/dL 54   Trlycerides <782 mg/dL 74   Hemoglobin N5A 4.8 - 5.6 % 5.8     Details             Exercise Target Goals: Exercise Program Goal: Individual exercise prescription set using results from initial 6 min walk test and THRR while considering  patient's activity barriers and safety.   Exercise Prescription Goal: Initial exercise prescription builds to 30-45 minutes a day of aerobic activity, 2-3 days per  week.  Home exercise guidelines will be given to patient during program as part of exercise prescription that the participant will acknowledge.   Education: Aerobic Exercise: - Group verbal and visual presentation on the components of exercise prescription. Introduces F.I.T.T principle from ACSM for exercise prescriptions.  Reviews F.I.T.T. principles of aerobic exercise including progression. Written material given at graduation. Flowsheet Row Cardiac Rehab from 02/14/2023 in [facility] Cardiac and Pulmonary Rehab  Education need identified 02/14/23       Education: Resistance Exercise: - Group verbal and  visual presentation on the components of exercise prescription. Introduces F.I.T.T principle from ACSM for exercise prescriptions  Reviews F.I.T.T. principles of resistance exercise including progression. Written material given at graduation.    Education: Exercise & Equipment Safety: - Individual verbal instruction and demonstration of equipment use and safety with use of the equipment. Flowsheet Row Cardiac Rehab from 02/14/2023 in [facility] Cardiac and Pulmonary Rehab  Date 02/14/23  Educator [facility]  Instruction Review Code 1- Verbalizes Understanding       Education: Exercise Physiology & General Exercise Guidelines: - Group verbal and written instruction with models to review the exercise physiology of the cardiovascular system and associated critical values. Provides general exercise guidelines with specific guidelines to those with heart or lung disease.  Flowsheet Row Cardiac Rehab from 02/14/2023 in [facility] Cardiac and Pulmonary Rehab  Education need identified 02/14/23       Education: Flexibility, Balance, Mind/Body Relaxation: - Group verbal and visual presentation with interactive activity on the components of exercise prescription. Introduces F.I.T.T principle from ACSM for exercise prescriptions. Reviews F.I.T.T. principles of flexibility and balance exercise training including  progression. Also discusses the mind body connection.  Reviews various relaxation techniques to help reduce and manage stress (i.e. Deep breathing, progressive muscle relaxation, and visualization). Balance handout provided to take home. Written material given at graduation.   Activity Barriers & Risk Stratification:  Activity Barriers & Cardiac Risk Stratification - 02/14/23 1351       Activity Barriers & Cardiac Risk Stratification   Activity Barriers Arthritis;Balance Concerns;Back Problems    Cardiac Risk Stratification High             6 Minute Walk:  6 Minute Walk     Row Name 02/14/23 1350         6 Minute Walk   Phase Initial     Distance 690 feet     Walk Time 6 minutes     # of Rest Breaks 0     MPH 1.31     METS 1.34     RPE 15     Perceived Dyspnea  3     VO2 Peak 4.68     Symptoms Yes (comment)     Comments back pain while walking 8/10 relieved with rest. SOB 3     Resting HR 61 bpm     Resting BP 138/70     Resting Oxygen Saturation  96 %     Exercise Oxygen Saturation  during 6 min walk 97 %     Max Ex. HR 109 bpm     Max Ex. BP 142/80     2 Minute Post BP 130/78              Oxygen Initial Assessment:   Oxygen Re-Evaluation:   Oxygen Discharge (Final Oxygen Re-Evaluation):   Initial Exercise Prescription:  Initial Exercise Prescription - 02/14/23 1300       Date of Initial Exercise RX and Referring Provider   Date 02/14/23    Referring Provider Dr. Marcina Millard      Recumbant Bike   Level 1    RPM 50    Watts 15    Minutes 15      NuStep   Level 1    SPM 80    Minutes 15    METs 1.34      REL-XR   Level 1    Speed 50    Minutes 15    METs 1.34  Biostep-RELP   Level 1    SPM 50    Minutes 15    METs 1.34      Track   Laps 10    Minutes 15    METs 1.54      Prescription Details   Frequency (times per week) 3    Duration Progress to 30 minutes of continuous aerobic without signs/symptoms of  physical distress      Intensity   THRR 40-80% of Max Heartrate 94-127    Ratings of Perceived Exertion 11-13    Perceived Dyspnea 0-4      Progression   Progression Continue to progress workloads to maintain intensity without signs/symptoms of physical distress.      Resistance Training   Training Prescription Yes    Weight 3    Reps 10-15             Perform Capillary Blood Glucose checks as needed.  Exercise Prescription Changes:   Exercise Prescription Changes     Row Name 02/14/23 1300             Response to Exercise   Blood Pressure (Admit) 138/70       Blood Pressure (Exercise) 142/80       Blood Pressure (Exit) 130/78       Heart Rate (Admit) 61 bpm       Heart Rate (Exercise) 109 bpm       Heart Rate (Exit) 66 bpm       Oxygen Saturation (Admit) 96 %       Oxygen Saturation (Exercise) 97 %       Oxygen Saturation (Exit) 96 %       Rating of Perceived Exertion (Exercise) 15       Perceived Dyspnea (Exercise) 3       Symptoms back pain 8/10 while walking and SOB       Comments 6 MWT results                Exercise Comments:   Exercise Goals and Review:   Exercise Goals     Row Name 02/14/23 1356             Exercise Goals   Increase Physical Activity Yes       Intervention Provide advice, education, support and counseling about physical activity/exercise needs.;Develop an individualized exercise prescription for aerobic and resistive training based on initial evaluation findings, risk stratification, comorbidities and participant's personal goals.       Expected Outcomes Short Term: Attend rehab on a regular basis to increase amount of physical activity.;Long Term: Add in home exercise to make exercise part of routine and to increase amount of physical activity.;Long Term: Exercising regularly at least 3-5 days a week.       Increase Strength and Stamina Yes       Intervention Provide advice, education, support and counseling about  physical activity/exercise needs.;Develop an individualized exercise prescription for aerobic and resistive training based on initial evaluation findings, risk stratification, comorbidities and participant's personal goals.       Expected Outcomes Short Term: Increase workloads from initial exercise prescription for resistance, speed, and METs.;Short Term: Perform resistance training exercises routinely during rehab and add in resistance training at home;Long Term: Improve cardiorespiratory fitness, muscular endurance and strength as measured by increased METs and functional capacity ( )       Able to understand and use rate of perceived exertion (RPE) scale Yes  Intervention Provide education and explanation on how to use RPE scale       Expected Outcomes Short Term: Able to use RPE daily in rehab to express subjective intensity level;Long Term:  Able to use RPE to guide intensity level when exercising independently       Able to understand and use Dyspnea scale Yes       Intervention Provide education and explanation on how to use Dyspnea scale       Expected Outcomes Short Term: Able to use Dyspnea scale daily in rehab to express subjective sense of shortness of breath during exertion;Long Term: Able to use Dyspnea scale to guide intensity level when exercising independently       Knowledge and understanding of Target Heart Rate Range (THRR) Yes       Intervention Provide education and explanation of THRR including how the numbers were predicted and where they are located for reference       Expected Outcomes Short Term: Able to state/look up THRR;Long Term: Able to use THRR to govern intensity when exercising independently;Short Term: Able to use daily as guideline for intensity in rehab       Able to check pulse independently Yes       Intervention Provide education and demonstration on how to check pulse in carotid and radial arteries.;Review the importance of being able to check your own  pulse for safety during independent exercise       Expected Outcomes Short Term: Able to explain why pulse checking is important during independent exercise;Long Term: Able to check pulse independently and accurately       Understanding of Exercise Prescription Yes       Intervention Provide education, explanation, and written materials on patient's individual exercise prescription       Expected Outcomes Short Term: Able to explain program exercise prescription;Long Term: Able to explain home exercise prescription to exercise independently                Exercise Goals Re-Evaluation :   Discharge Exercise Prescription (Final Exercise Prescription Changes):  Exercise Prescription Changes - 02/14/23 1300       Response to Exercise   Blood Pressure (Admit) 138/70    Blood Pressure (Exercise) 142/80    Blood Pressure (Exit) 130/78    Heart Rate (Admit) 61 bpm    Heart Rate (Exercise) 109 bpm    Heart Rate (Exit) 66 bpm    Oxygen Saturation (Admit) 96 %    Oxygen Saturation (Exercise) 97 %    Oxygen Saturation (Exit) 96 %    Rating of Perceived Exertion (Exercise) 15    Perceived Dyspnea (Exercise) 3    Symptoms back pain 8/10 while walking and SOB    Comments 6 MWT results             Nutrition:  Target Goals: Understanding of nutrition guidelines, daily intake of sodium 1500mg , cholesterol 200mg , calories 30% from fat and 7% or less from saturated fats, daily to have 5 or more servings of fruits and vegetables.  Education: All About Nutrition: -Group instruction provided by verbal, written material, interactive activities, discussions, models, and posters to present general guidelines for heart healthy nutrition including fat, fiber, MyPlate, the role of sodium in heart healthy nutrition, utilization of the nutrition label, and utilization of this knowledge for meal planning. Follow up email sent as well. Written material given at graduation.   Biometrics:  Pre  Biometrics - 02/14/23 1357  Pre Biometrics   Height 6' (1.829 m)    Weight 249 lb 12.8 oz (113.3 kg)    Waist Circumference 52 inches    Hip Circumference 48 inches    Waist to Hip Ratio 1.08 %    BMI (Calculated) 33.87    Single Leg Stand 7.03 seconds              Nutrition Therapy Plan and Nutrition Goals:  Nutrition Therapy & Goals - 02/14/23 1335       Nutrition Therapy   Diet Cardiac, Low Na    Protein (specify units) 90    Fiber 30 grams    Whole Grain Foods 3 servings    Saturated Fats 15 max. grams    Fruits and Vegetables 5 servings/day    Sodium 2 grams      Personal Nutrition Goals   Nutrition Goal Drink 3 bottles of water daily    Personal Goal #2 Use healthy fats to promote hearth health, but watch calories    Personal Goal #3 Pair carb with protein at meal    Comments Patient drinking 2 bottles of water, ~32oz daily. Knows he needs to drink more, discussed goal of 3 bottles per day. He reports he tries to eat 3 meals per day. Sometimes eats late in the evening. Educated on [employer] handout, types of fats, sources, and how to read label. His nutrition knowledge was limited but kept things simple and used nutritional food groups sheet as reference to help him build more balanced meals with foods he likes and will focus on smaller portions of meat, leaner meat choices, more colorful produce and complex carbs paired with good protein or healthy fats. Answered questions he had around, saturated fat, sodium, fiber and cholesterol.      Intervention Plan   Intervention Prescribe, educate and counsel regarding individualized specific dietary modifications aiming towards targeted core components such as weight, hypertension, lipid management, diabetes, heart failure and other comorbidities.;Nutrition handout(s) given to patient.    Expected Outcomes Short Term Goal: Understand basic principles of dietary content, such as calories, fat, sodium, cholesterol  and nutrients.;Short Term Goal: A plan has been developed with personal nutrition goals set during dietitian appointment.;Long Term Goal: Adherence to prescribed nutrition plan.             Nutrition Assessments:  MEDIFICTS Score Key: >=70 Need to make dietary changes  40-70 Heart Healthy Diet <= 40 Therapeutic Level Cholesterol Diet  Flowsheet Row Cardiac Rehab from 02/14/2023 in [facility] Cardiac and Pulmonary Rehab  Picture Your Plate Total Score on Admission 45      Picture Your Plate Scores: <95 Unhealthy dietary pattern with much room for improvement. 41-50 Dietary pattern unlikely to meet recommendations for good health and room for improvement. 51-60 More healthful dietary pattern, with some room for improvement.  >60 Healthy dietary pattern, although there may be some specific behaviors that could be improved.    Nutrition Goals Re-Evaluation:   Nutrition Goals Discharge (Final Nutrition Goals Re-Evaluation):   Psychosocial: Target Goals: Acknowledge presence or absence of significant depression and/or stress, maximize coping skills, provide positive support system. Participant is able to verbalize types and ability to use techniques and skills needed for reducing stress and depression.   Education: Stress, Anxiety, and Depression - Group verbal and visual presentation to define topics covered.  Reviews how body is impacted by stress, anxiety, and depression.  Also discusses healthy ways to reduce stress and to treat/manage anxiety and depression.  Written material given at graduation.   Education: Sleep Hygiene -Provides group verbal and written instruction about how sleep can affect your health.  Define sleep hygiene, discuss sleep cycles and impact of sleep habits. Review good sleep hygiene tips.    Initial Review & Psychosocial Screening:  Initial Psych Review & Screening - 02/05/23 1028       Initial Review   Current issues with Current Anxiety/Panic       Family Dynamics   Good Support System? Yes    Comments Raiford Noble has intra family strains and can get depressed over his daughter that is off of drugs now but doesnt get to see his grandkids. He has his wife, daughter and good friend to look to for support.      Barriers   Psychosocial barriers to participate in program The patient should benefit from training in stress management and relaxation.;There are no identifiable barriers or psychosocial needs.      Screening Interventions   Interventions To provide support and resources with identified psychosocial needs;Provide feedback about the scores to participant;Encouraged to exercise    Expected Outcomes Short Term goal: Utilizing psychosocial counselor, staff and physician to assist with identification of specific Stressors or current issues interfering with healing process. Setting desired goal for each stressor or current issue identified.;Long Term Goal: Stressors or current issues are controlled or eliminated.;Short Term goal: Identification and review with participant of any Quality of Life or Depression concerns found by scoring the questionnaire.;Long Term goal: The participant improves quality of Life and PHQ9 Scores as seen by post scores and/or verbalization of changes             Quality of Life Scores:   Quality of Life - 02/14/23 1359       Quality of Life   Select Quality of Life      Quality of Life Scores   Health/Function Pre 15.5 %    Socioeconomic Pre 24.29 %    Psych/Spiritual Pre 24.64 %    Family Pre 25.2 %    GLOBAL Pre 20.62 %            Scores of 19 and below usually indicate a poorer quality of life in these areas.  A difference of  2-3 points is a clinically meaningful difference.  A difference of 2-3 points in the total score of the Quality of Life Index has been associated with significant improvement in overall quality of life, self-image, physical symptoms, and general health in studies assessing change  in quality of life.  PHQ-9: Review Flowsheet       02/14/2023 07/02/2017  Depression screen PHQ 2/9  Decreased Interest 1 0  Down, Depressed, Hopeless 0 0  PHQ - 2 Score 1 0  Altered sleeping 1 -  Tired, decreased energy 1 -  Change in appetite 0 -  Feeling bad or failure about yourself  1 -  Trouble concentrating 0 -  Moving slowly or fidgety/restless 0 -  Suicidal thoughts 0 -  PHQ-9 Score 4 -  Difficult doing work/chores Somewhat difficult -    Details           Interpretation of Total Score  Total Score Depression Severity:  1-4 = Minimal depression, 5-9 = Mild depression, 10-14 = Moderate depression, 15-19 = Moderately severe depression, 20-27 = Severe depression   Psychosocial Evaluation and Intervention:  Psychosocial Evaluation - 02/05/23 1032       Psychosocial Evaluation & Interventions   Interventions Encouraged to  exercise with the program and follow exercise prescription;Stress management education;Relaxation education    Comments Raiford Noble has intra family strains and can get depressed over his daughter that is off of drugs now but doesnt get to see his grandkids. He has his wife, daughter and good friend to look to for support.    Expected Outcomes Short: Start HeartTrack to help with mood. Long: Maintain a healthy mental state    Continue Psychosocial Services  Follow up required by staff             Psychosocial Re-Evaluation:   Psychosocial Discharge (Final Psychosocial Re-Evaluation):   Vocational Rehabilitation: Provide vocational rehab assistance to qualifying candidates.   Vocational Rehab Evaluation & Intervention:   Education: Education Goals: Education classes will be provided on a variety of topics geared toward better understanding of heart health and risk factor modification. Participant will state understanding/return demonstration of topics presented as noted by education test scores.  Learning Barriers/Preferences:  Learning  Barriers/Preferences - 02/05/23 1028       Learning Barriers/Preferences   Learning Barriers None    Learning Preferences None             General Cardiac Education Topics:  AED/CPR: - Group verbal and written instruction with the use of models to demonstrate the basic use of the AED with the basic ABC's of resuscitation.   Anatomy and Cardiac Procedures: - Group verbal and visual presentation and models provide information about basic cardiac anatomy and function. Reviews the testing methods done to diagnose heart disease and the outcomes of the test results. Describes the treatment choices: Medical Management, Angioplasty, or Coronary Bypass Surgery for treating various heart conditions including Myocardial Infarction, Angina, Valve Disease, and Cardiac Arrhythmias.  Written material given at graduation. Flowsheet Row Cardiac Rehab from 02/14/2023 in [facility] [facility]  Education need identified 02/14/23       Medication Safety: - Group verbal and visual instruction to review commonly prescribed medications for heart and lung disease. Reviews the medication, class of the drug, and side effects. Includes the steps to properly store meds and maintain the prescription regimen.  Written material given at graduation.   Intimacy: - Group verbal instruction through game format to discuss how heart and lung disease can affect sexual intimacy. Written material given at graduation..   Know Your Numbers and Heart Failure: - Group verbal and visual instruction to discuss disease risk factors for cardiac and pulmonary disease and treatment options.  Reviews associated critical values for Overweight/Obesity, Hypertension, Cholesterol, and Diabetes.  Discusses basics of heart failure: signs/symptoms and treatments.  Introduces Heart Failure Zone chart for action plan for heart failure.  Written material given at graduation. Flowsheet Row Cardiac Rehab from 02/14/2023 in [facility]  Cardiac and Pulmonary Rehab  Education need identified 02/14/23       Infection Prevention: - Provides verbal and written material to individual with discussion of infection control including proper hand washing and proper equipment cleaning during exercise session. Flowsheet Row Cardiac Rehab from 02/14/2023 in [facility] Cardiac and Pulmonary Rehab  Date 02/14/23  Educator [facility]  Instruction Review Code 1- Verbalizes Understanding       Falls Prevention: - Provides verbal and written material to individual with discussion of falls prevention and safety. Flowsheet Row Cardiac Rehab from 02/14/2023 in [facility] Cardiac and Pulmonary Rehab  Date 02/14/23  Educator [facility]  Instruction Review Code 1- Verbalizes Understanding       Other: -Provides group and verbal instruction on various  topics (see comments)   Knowledge Questionnaire Score:  Knowledge Questionnaire Score - 02/14/23 1358       Knowledge Questionnaire Score   Pre Score 21/26             Core Components/Risk Factors/Patient Goals at Admission:  Personal Goals and Risk Factors at Admission - 02/14/23 1359       Core Components/Risk Factors/Patient Goals on Admission    Weight Management Yes;Weight Loss    Intervention Weight Management: Develop a combined nutrition and exercise program designed to reach desired caloric intake, while maintaining appropriate intake of nutrient and fiber, sodium and fats, and appropriate energy expenditure required for the weight goal.;Weight Management: Provide education and appropriate resources to help participant work on and attain dietary goals.;Weight Management/Obesity: Establish reasonable short term and long term weight goals.;Obesity: Provide education and appropriate resources to help participant work on and attain dietary goals.    Admit Weight 249 lb 12.8 oz (113.3 kg)    Goal Weight: Short Term 240 lb (108.9 kg)    Goal Weight: Long Term 185 lb (83.9 kg)    Expected Outcomes  Short Term: Continue to assess and modify interventions until short term weight is achieved;Long Term: Adherence to nutrition and physical activity/exercise program aimed toward attainment of established weight goal;Understanding recommendations for meals to include 15-35% energy as protein, 25-35% energy from fat, 35-60% energy from carbohydrates, less than 200mg  of dietary cholesterol, 20-35 gm of total fiber daily;Understanding of distribution of calorie intake throughout the day with the consumption of 4-5 meals/snacks;Weight Loss: Understanding of general recommendations for a balanced deficit meal plan, which promotes 1-2 lb weight loss per week and includes a negative energy balance of [phone] kcal/d    Heart Failure Yes    Intervention Provide a combined exercise and nutrition program that is supplemented with education, support and counseling about heart failure. Directed toward relieving symptoms such as shortness of breath, decreased exercise tolerance, and extremity edema.    Expected Outcomes Improve functional capacity of life;Short term: Attendance in program 2-3 days a week with increased exercise capacity. Reported lower sodium intake. Reported increased fruit and vegetable intake. Reports medication compliance.;Short term: Daily weights obtained and reported for increase. Utilizing diuretic protocols set by physician.;Long term: Adoption of self-care skills and reduction of barriers for early signs and symptoms recognition and intervention leading to self-care maintenance.    Hypertension Yes    Intervention Provide education on lifestyle modifcations including regular physical activity/exercise, weight management, moderate sodium restriction and increased consumption of fresh fruit, vegetables, and low fat dairy, alcohol moderation, and smoking cessation.;Monitor prescription use compliance.    Expected Outcomes Short Term: Continued assessment and intervention until BP is < 140/34mm HG in  hypertensive participants. < 130/27mm HG in hypertensive participants with diabetes, heart failure or chronic kidney disease.;Long Term: Maintenance of blood pressure at goal levels.    Lipids Yes    Intervention Provide education and support for participant on nutrition & aerobic/resistive exercise along with prescribed medications to achieve LDL 70mg , HDL >40mg .    Expected Outcomes Short Term: Participant states understanding of desired cholesterol values and is compliant with medications prescribed. Participant is following exercise prescription and nutrition guidelines.;Long Term: Cholesterol controlled with medications as prescribed, with individualized exercise RX and with personalized nutrition plan. Value goals: LDL < 70mg , HDL > 40 mg.             Education:Diabetes - Individual verbal and written instruction to review signs/symptoms of diabetes, desired  ranges of glucose level fasting, after meals and with exercise. Acknowledge that pre and post exercise glucose checks will be done for 3 sessions at entry of program.   Core Components/Risk Factors/Patient Goals Review:    Core Components/Risk Factors/Patient Goals at Discharge (Final Review):    ITP Comments:  ITP Comments     Row Name 02/05/23 1026 02/14/23 1348         ITP Comments Virtual Visit completed. Patient informed on EP and RD appointment and 6 Minute walk test. Patient also informed of patient health questionnaires on My Chart. Patient Verbalizes understanding. Visit diagnosis can be found in [facility] 12/27/[facility]. Completed and gym orientation. Initial ITP created and sent for review to Dr. Bethann Punches, Medical Director.               Comments: initial ITP

## 2023-02-14 NOTE — Progress Notes (Signed)
Assessment start time: 11:51 AM  Digestive issues/concerns: no known food allergies no chewing or swallowing foods  24-hours Recall: B: protein drink  L: egg sandwich, apple sauce  D: salisbury steak, mac and cheese, rice and peas Snack: cup of ice cream, cheese peanut butter crackers  Beverages water (32oz), coffee Alcohol rarely Caffeine coffee  Supplements MVI, Vitamin D  Education r/t nutrition plan Patient drinking 2 bottles of water, ~32oz daily. Knows he needs to drink more, discussed goal of 3 bottles per day. He reports he tries to eat 3 meals per day. Sometimes eats late in the evening. Educated on [employer] handout, types of fats, sources, and how to read label. His nutrition knowledge was limited but kept things simple and used nutritional food groups sheet as reference to help him build more balanced meals with foods he likes and will focus on smaller portions of meat, leaner meat choices, more colorful produce and complex carbs paired with good protein or healthy fats. Answered questions he had around, saturated fat, sodium, fiber and cholesterol.       Goal 1: Drink 3 bottles of water daily  Goal 2: Use healthy fats to promote hearth health, but watch calories Goal 3: Pair carb with protein at meal   End time 12:36 PM

## 2023-02-15 DIAGNOSIS — J4540 Moderate persistent asthma, uncomplicated: Secondary | ICD-10-CM | POA: Diagnosis not present

## 2023-02-15 DIAGNOSIS — J8283 Eosinophilic asthma: Secondary | ICD-10-CM | POA: Diagnosis not present

## 2023-02-19 ENCOUNTER — Encounter: Payer: HMO | Admitting: *Deleted

## 2023-02-19 DIAGNOSIS — Z955 Presence of coronary angioplasty implant and graft: Secondary | ICD-10-CM

## 2023-02-19 DIAGNOSIS — I214 Non-ST elevation (NSTEMI) myocardial infarction: Secondary | ICD-10-CM

## 2023-02-19 NOTE — Progress Notes (Signed)
Daily Session Note  Patient Details  Name: Marc Gray MRN: [id number] Date of Birth: [birth year] Referring Provider:   Flowsheet Row Cardiac Rehab from 02/14/2023 in [facility] Cardiac and Pulmonary Rehab  Referring Provider Dr. Marcina Millard       Encounter Date: 02/19/2023  Check In:  Session Check In - 02/19/23 1146       Check-In   Supervising physician immediately available to respond to emergencies See telemetry face sheet for immediately available ER MD    Location [facility]-Cardiac & Pulmonary Rehab    Staff Present Rory Percy, MS, Exercise Physiologist;Meredith Jewel Baize, RN BSN;Maxon Conetta BS, , Exercise Physiologist;Kelly Madilyn Fireman, BS, ACSM CEP, Exercise Physiologist;Sherilynn Dieu Katrinka Blazing, RN, ADN    Virtual Visit No    Medication changes reported     No    Fall or balance concerns reported    No    Warm-up and Cool-down Performed on first and last piece of equipment    Resistance Training Performed Yes    VAD Patient? No    PAD/SET Patient? No      Pain Assessment   Currently in Pain? No/denies                Social History   Tobacco Use  Smoking Status Former   Current packs/day: 0.00   Average packs/day: 1 pack/day for 53.0 years (53.0 ttl pk-yrs)   Types: Cigarettes   Start date: 04/14/1964   Quit date: 04/14/2017   Years since quitting: 5.8   Passive exposure: Past  Smokeless Tobacco Never  Tobacco Comments   Quit November 2018    Goals Met:  Independence with exercise equipment Exercise tolerated well No report of concerns or symptoms today Strength training completed today  Goals Unmet:  Not Applicable  Comments: First full day of exercise!  Patient was oriented to gym and equipment including functions, settings, policies, and procedures.  Patient's individual exercise prescription and treatment plan were reviewed.  All starting workloads were established based on the results of the 6 minute walk test done at initial orientation visit.   The plan for exercise progression was also introduced and progression will be customized based on patient's performance and goals.    Dr. Bethann Punches is Medical Director for [facility] Cardiac Rehabilitation.  Dr. Vida Rigger is Medical Director for [facility] Pulmonary Rehabilitation.

## 2023-02-20 ENCOUNTER — Ambulatory Visit: Admit: 2023-02-20 | Payer: HMO

## 2023-02-20 ENCOUNTER — Ambulatory Visit
Admission: RE | Admit: 2023-02-20 | Discharge: 2023-02-20 | Disposition: A | Discharge status: Home / Self Care | Payer: HMO | Source: Ambulatory Visit | Attending: Neurosurgery

## 2023-02-20 ENCOUNTER — Encounter: Payer: Self-pay | Admitting: Neurosurgery

## 2023-02-20 ENCOUNTER — Ambulatory Visit (INDEPENDENT_AMBULATORY_CARE_PROVIDER_SITE_OTHER): Payer: HMO | Admitting: Neurosurgery

## 2023-02-20 ENCOUNTER — Ambulatory Visit
Admission: RE | Admit: 2023-02-20 | Discharge: 2023-02-20 | Disposition: A | Discharge status: Home / Self Care | Payer: HMO | Attending: Neurosurgery | Admitting: Neurosurgery

## 2023-02-20 VITALS — BP 108/64 | Ht 73.0 in | Wt 252.0 lb

## 2023-02-20 DIAGNOSIS — E039 Hypothyroidism, unspecified: Secondary | ICD-10-CM | POA: Diagnosis not present

## 2023-02-20 DIAGNOSIS — M8580 Other specified disorders of bone density and structure, unspecified site: Secondary | ICD-10-CM

## 2023-02-20 DIAGNOSIS — M47816 Spondylosis without myelopathy or radiculopathy, lumbar region: Secondary | ICD-10-CM | POA: Diagnosis not present

## 2023-02-20 DIAGNOSIS — M4316 Spondylolisthesis, lumbar region: Secondary | ICD-10-CM

## 2023-02-20 DIAGNOSIS — G959 Disease of spinal cord, unspecified: Secondary | ICD-10-CM | POA: Diagnosis not present

## 2023-02-20 DIAGNOSIS — M5126 Other intervertebral disc displacement, lumbar region: Secondary | ICD-10-CM | POA: Diagnosis not present

## 2023-02-20 DIAGNOSIS — G4733 Obstructive sleep apnea (adult) (pediatric): Secondary | ICD-10-CM | POA: Diagnosis not present

## 2023-02-20 DIAGNOSIS — M4807 Spinal stenosis, lumbosacral region: Secondary | ICD-10-CM | POA: Diagnosis not present

## 2023-02-20 DIAGNOSIS — I714 Abdominal aortic aneurysm, without rupture, unspecified: Secondary | ICD-10-CM | POA: Diagnosis not present

## 2023-02-20 DIAGNOSIS — E2749 Other adrenocortical insufficiency: Secondary | ICD-10-CM | POA: Diagnosis not present

## 2023-02-20 SURGERY — COLONOSCOPY WITH PROPOFOL
Anesthesia: General

## 2023-02-20 MED ORDER — TRAMADOL HCL 50 MG PO TABS
50.0000 mg | ORAL_TABLET | Freq: Two times a day (BID) | ORAL | 0 refills | Status: AC | PRN
Start: 2023-02-20 — End: 2023-02-27

## 2023-02-20 NOTE — Progress Notes (Signed)
Referring Physician:  [name], MD [address] [place],  [place] [address]  Primary Physician:  Marc Mina, MD  History of Present Illness: 02/20/23 Marc Gray is a 77 y.o presenting with ongoing back and a new onset of heaviness in his legs since his last visit.  He completed physical therapy at [facility] in November of last year and underwent ESIs with Dr. Yves Dill last year. Today he reports ongoing back pain and a new onset of heaviness in his legs particularly with walking long distances which is making things like grocery shopping more challenging.  He continues to have significant low back pain that is worse with changing positions.  He did have good relief after his injections but only for a couple of days.  In addition to this, he reports having an MI and a stent placed about a month ago and is currently on dual antiplatelet therapy.  He also endorses about 6 months of numbness and tingling into his hands with some gait disturbance and problems with his dexterity.  01/19/2022 History of lung CA, HTN, and CKD 3. On chronic prednisone for his adrenal insufficiency.   [name] is here today with a chief complaint of 2 year history of more constant LBP, left > right, with no leg pain. Pain is worse with activity.  Scribes sharp low back pain with getting up from a seated position.  Relief with sitting and resting.  Denies numbness or tingling that radiates into his legs or associated leg pain.  Worked out with trainer previously. No PT. No weakness in his legs. No bowel/bladder dysfunction.   Conservative measures:  Physical therapy: no  Multimodal medical therapy including regular antiinflammatories: prednisone (chronic) Injections: 04/28/21 bilateral L5-S1 TF ESI with 50% improvement per notes (no relief per patient).    Past Surgery: no lumbar surgery  Marc Gray has no symptoms of cervical myelopathy.  The  symptoms are causing a significant impact on the patient's life.   Review of Systems:  A 10 point review of systems is negative, except for the pertinent positives and negatives detailed in the HPI.  Past Medical History: Past Medical History:  Diagnosis Date   Anxiety    Aortic atherosclerosis (HCC)    Arthritis    SHOULDER   BPH (benign prostatic hyperplasia)    Cataract    Chronic kidney disease    low kidney function   Colon adenomas    Colon cancer (HCC) 2015   Pt states during his colonoscopy he had cancerous polyps removed.    COPD (chronic obstructive pulmonary disease) (HCC)    Depression    SINCE DIAGNOSIS   Dyspnea    DOE   Enlarged prostate    GERD (gastroesophageal reflux disease)    Hypertension    Hypothyroidism    Lung cancer (HCC)    Aug 2018   Pain    DUE TO LUNG ISSUE    Past Surgical History: Past Surgical History:  Procedure Laterality Date   BACK SURGERY     kyphoplasty  T5-6   CATARACT EXTRACTION W/ INTRAOCULAR LENS  IMPLANT, BILATERAL     COLONOSCOPY     COLONOSCOPY WITH PROPOFOL N/A 12/17/2019   Procedure: COLONOSCOPY WITH PROPOFOL;  Surgeon: Earline Mayotte, MD;  Location: [facility] ENDOSCOPY;  Service: Endoscopy;  Laterality: N/A;   CORONARY STENT INTERVENTION N/A 12/29/2022   Procedure: CORONARY STENT INTERVENTION;  Surgeon: Marcina Millard, MD;  Location: [facility] INVASIVE CV  LAB;  Service: Cardiovascular;  Laterality: N/A;   EYE SURGERY Bilateral    cataract   LEFT HEART CATH AND CORONARY ANGIOGRAPHY N/A 12/29/2022   Procedure: LEFT HEART CATH AND CORONARY ANGIOGRAPHY;  Surgeon: Marcina Millard, MD;  Location: [facility] INVASIVE CV LAB;  Service: Cardiovascular;  Laterality: N/A;   LYMPH GLAND EXCISION N/A 02/09/2017   Procedure: CERVICAL LYMPH NODE BIOPSY;  Surgeon: Earline Mayotte, MD;  Location: [facility] ORS;  Service: General;  Laterality: N/A;   PORTACATH PLACEMENT Right 02/23/2017   Procedure: INSERTION PORT-A-CATH;  Surgeon: Earline Mayotte, MD;  Location: [facility] ORS;  Service: General;  Laterality: Right;   PROSTATE SURGERY     TONSILLECTOMY     TRANSURETHRAL RESECTION OF PROSTATE N/A 02/14/2019   Procedure: TRANSURETHRAL RESECTION OF THE PROSTATE (TURP);  Surgeon: Sondra Come, MD;  Location: [facility] ORS;  Service: Urology;  Laterality: N/A;    Allergies: Allergies as of 02/20/2023 - Review Complete 02/20/2023  Allergen Reaction Noted   Morphine and codeine  12/16/2019    Medications: Outpatient Encounter Medications as of 02/20/2023  Medication Sig   acetaminophen (TYLENOL) 500 MG tablet Take 1,000 mg by mouth every 6 (six) hours as needed for moderate pain.   aspirin EC 81 MG tablet Take 1 tablet (81 mg total) by mouth daily. Swallow whole.   Azelastine HCl 137 MCG/SPRAY SOLN SMARTSIG:1-2 Spray(s) Both Nares Twice Daily   BREZTRI AEROSPHERE 160-9-4.8 MCG/ACT AERO Inhale 2 puffs into the lungs 2 (two) times daily.   calcium-vitamin D (OSCAL WITH D) 500-200 MG-UNIT tablet Take 1 tablet by mouth daily.   diphenhydrAMINE-zinc acetate (BENADRYL EXTRA STRENGTH) cream Apply 1 application topically 3 (three) times daily as needed for itching.   DUPIXENT 300 MG/2ML SOPN Inject 300 mg into the skin every 14 (fourteen) days.   EPINEPHrine 0.3 mg/0.3 mL IJ SOAJ injection Inject 0.3 mg into the muscle as needed.   fluticasone (FLONASE) 50 MCG/ACT nasal spray Place 1 spray into both nostrils daily.   furosemide (LASIX) 20 MG tablet Take by mouth.   Ipratropium-Albuterol (COMBIVENT) 20-100 MCG/ACT AERS respimat Inhale into the lungs.   levothyroxine (SYNTHROID) 150 MCG tablet Take 1 tablet by mouth daily.   Melatonin 10 MG TABS Take 10 mg by mouth at bedtime.    metoprolol tartrate (LOPRESSOR) 25 MG tablet Take 0.5 tablets (12.5 mg total) by mouth 2 (two) times daily.   montelukast (SINGULAIR) 10 MG tablet Take 10 mg by mouth daily.   Multiple Vitamin (MULTI-VITAMINS) TABS Take 1 tablet by mouth daily.    ondansetron  (ZOFRAN) 8 MG tablet Take 1 tablet (8 mg total) by mouth every 12 (twelve) hours as needed for nausea or vomiting.   pantoprazole (PROTONIX) 40 MG tablet Take 1 tablet (40 mg total) by mouth daily.   polyethylene glycol-electrolytes (NULYTELY) 420 g solution MIX AND DRINK AS DIRECTED   prasugrel (EFFIENT) 10 MG TABS tablet Take 1 tablet (10 mg total) by mouth daily.   predniSONE (DELTASONE) 5 MG tablet Take 5 mg by mouth daily.   prochlorperazine (COMPAZINE) 10 MG tablet Take 1 tablet (10 mg total) by mouth every 6 (six) hours as needed for nausea or vomiting.   rosuvastatin (CRESTOR) 10 MG tablet    SHINGRIX injection  (Patient not taking: Reported on 02/05/2023)   SPIKEVAX syringe  (Patient not taking: Reported on 02/05/2023)   tadalafil (CIALIS) 5 MG tablet Take 5-10mg  by mouth as needed prior to intercourse   tamsulosin (FLOMAX) 0.4 MG  CAPS capsule Take 1 capsule (0.4 mg total) by mouth daily.   traZODone (DESYREL) 100 MG tablet Take 100 mg by mouth at bedtime.   triamcinolone ointment (KENALOG) 0.5 % Apply 1 application topically 3 (three) times daily as needed.   [DISCONTINUED] lidocaine-prilocaine (EMLA) cream Apply over port 1-2 hours prior to chemotherapy treatment.  Cover with plastic wrap.   [DISCONTINUED] metoprolol tartrate (LOPRESSOR) 25 MG tablet Take by mouth. (Patient not taking: Reported on 02/05/2023)   [DISCONTINUED] prasugrel (EFFIENT) 10 MG TABS tablet Take 1 tablet by mouth daily. (Patient not taking: Reported on 02/05/2023)   Facility-Administered Encounter Medications as of 02/20/2023  Medication   sodium chloride flush (NS) 0.9 % injection 10 mL    Social History: Social History   Tobacco Use   Smoking status: Former    Current packs/day: 0.00    Average packs/day: 1 pack/day for 53.0 years (53.0 ttl pk-yrs)    Types: Cigarettes    Start date: 04/14/1964    Quit date: 04/14/2017    Years since quitting: 5.8    Passive exposure: Past   Smokeless tobacco: Never    Tobacco comments:    Quit November 2018  Vaping Use   Vaping status: Never Used  Substance Use Topics   Alcohol use: Not Currently    Comment: occassional   Drug use: No    Family Medical History: Family History  Problem Relation Age of Onset   Breast cancer Maternal Grandmother    Dementia Mother    Diabetes Father    Stroke Father     Physical Examination: A&Ox3  NEUROLOGICAL:     Awake, alert, oriented to person, place, and time.  Speech is clear and fluent. Fund of knowledge is appropriate.   Cranial Nerves: Pupils equal round and reactive to light.  Facial tone is symmetric.  Facial sensation is symmetric.  ROM of spine: reasonable ROM with no pain. Mild diffuse lower lumbar tenderness.     Strength: Side Biceps Triceps Deltoid Interossei Grip Wrist Ext. Wrist Flex.  R 5 5 5 5 5 5 5   L 5 5 5 5 5 5 5    Side Iliopsoas Quads Hamstring PF DF EHL  R 5 5 5 5 5 5   L 5 5 5 5 5 5    Reflexes are 3+ and symmetric at the biceps, triceps, brachioradialis, patella and achilles.  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 Clonus is not present.  Toes are down-going.  Decreases sensation throughout BUE and BLE Ambulates with a wide-based, slowed gait with the assistance of a cane.  Medical Decision Making  Imaging: Lumbar MRI 04/10/21  IMPRESSION: 1. Mild levoconvex lumbar scoliosis with grade 1 anterolisthesis at L3-L4, mild retrolisthesis at both L4-L5 and L5-S1. Associated disc, endplate, and posterior element degeneration.   2. Subsequent: - L3-L4 mild spinal and right lateral recess stenosis. - L4-L5 caudal disc herniation in the midline contributing to moderate lateral recess stenosis greater on the left, and moderate foraminal stenosis greater on the right. - L5-S1 mild left lateral recess and mild to moderate left foraminal stenosis. 3. Query left L5 and right L4 radiculitis.     Electronically Signed   By: Odessa Fleming M.D.   On: 04/11/2021 06:25   I have personally reviewed  the images and agree with the above interpretation.  Assessment and Plan: Mr. Mackler is a pleasant 77 y.o. male with a longstanding history of lumbosacral complaints and L3-4 anterolisthesis.  He is now having heaviness in his legs  which could represent neurogenic claudication.  Would like to get lumbar flexion-extension x-rays given his dynamic back pain and an updated lumbar MRI.  Because of his longstanding history of corticosteroids, I would like to obtain an updated bone density as his last bone density was about 3 years ago and showed some osteopenia.  He is describing some symptoms concerning for early onset of cervical myelopathy and his exam does show hyperreflexia, because of this I will obtain a cervical MRI.  Once his studies are completed I will see him back to review the results and discuss further plan of care.  We did discuss that his recent cardiac event and need for ongoing anticoagulation may affect his candidacy for surgery as well as his current pulmonary condition.  We will address this further after obtaining his updated imaging.  In the meantime he has requested a refill of tramadol which has helped him in the past.  He is taking this very sparingly.  We did discuss the potential medication side effects.  I spent a total of 35 minutes in both face-to-face and non-face-to-face activities for this visit on the date of this encounter including review of old imaging, discussion of symptoms, discussion of differential diagnosis, physical exam, documentation, and order placement.   Kallen Charity PA-C Dept. of Neurosurgery

## 2023-02-21 ENCOUNTER — Encounter: Payer: HMO | Admitting: *Deleted

## 2023-02-21 DIAGNOSIS — Z955 Presence of coronary angioplasty implant and graft: Secondary | ICD-10-CM

## 2023-02-21 DIAGNOSIS — I214 Non-ST elevation (NSTEMI) myocardial infarction: Secondary | ICD-10-CM | POA: Diagnosis not present

## 2023-02-21 NOTE — Progress Notes (Signed)
Daily Session Note  Patient Details  Name: Marc Gray MRN: [id number] Date of Birth: Sep 06, 1945 Referring Provider:   Flowsheet Row Cardiac Rehab from 02/14/2023 in [facility] Cardiac and Pulmonary Rehab  Referring Provider Dr. Marcina Millard       Encounter Date: 02/21/2023  Check In:  Session Check In - 02/21/23 1144       Check-In   Supervising physician immediately available to respond to emergencies See telemetry face sheet for immediately available ER MD    Location [facility]-Cardiac & Pulmonary Rehab    Staff Present Bess Kinds RN, BSN;Laureen Manson Passey, BS, RRT, CPFT;Maxon Conetta BS, , Exercise Physiologist;Donjuan Robison Katrinka Blazing, RN, ADN    Virtual Visit No    Medication changes reported     No    Fall or balance concerns reported    No    Warm-up and Cool-down Performed on first and last piece of equipment    Resistance Training Performed Yes    VAD Patient? No    PAD/SET Patient? No      Pain Assessment   Currently in Pain? No/denies                Social History   Tobacco Use  Smoking Status Former   Current packs/day: 0.00   Average packs/day: 1 pack/day for 53.0 years (53.0 ttl pk-yrs)   Types: Cigarettes   Start date: 04/14/1964   Quit date: 04/14/2017   Years since quitting: 5.8   Passive exposure: Past  Smokeless Tobacco Never  Tobacco Comments   Quit November 2018    Goals Met:  Independence with exercise equipment Exercise tolerated well No report of concerns or symptoms today Strength training completed today  Goals Unmet:  Not Applicable  Comments: Pt able to follow exercise prescription today without complaint.  Will continue to monitor for progression.    Dr. Bethann Punches is Medical Director for [facility] Cardiac Rehabilitation.  Dr. Vida Rigger is Medical Director for [facility] Pulmonary Rehabilitation.

## 2023-02-23 DIAGNOSIS — I214 Non-ST elevation (NSTEMI) myocardial infarction: Secondary | ICD-10-CM

## 2023-02-23 DIAGNOSIS — Z955 Presence of coronary angioplasty implant and graft: Secondary | ICD-10-CM

## 2023-02-23 NOTE — Progress Notes (Signed)
Daily Session Note  Patient Details  Name: Marc Gray MRN: [id number] Date of Birth: 19-Nov-1945 Referring Provider:   Flowsheet Row Cardiac Rehab from 02/14/2023 in [facility] Cardiac and Pulmonary Rehab  Referring Provider Dr. Marcina Millard       Encounter Date: 02/23/2023  Check In:  Session Check In - 02/23/23 1112       Check-In   Supervising physician immediately available to respond to emergencies See telemetry face sheet for immediately available ER MD    Location [facility]-Cardiac & Pulmonary Rehab    Staff Present Harlene Ramus, RN, BSN;Joseph Hood, RCP,RRT,BSRT;Noah Tickle, [place], Exercise Physiologist    Virtual Visit No    Medication changes reported     No    Fall or balance concerns reported    No    Tobacco Cessation No Change    Warm-up and Cool-down Performed on first and last piece of equipment    Resistance Training Performed Yes    VAD Patient? No    PAD/SET Patient? No      Pain Assessment   Currently in Pain? No/denies                Social History   Tobacco Use  Smoking Status Former   Current packs/day: 0.00   Average packs/day: 1 pack/day for 53.0 years (53.0 ttl pk-yrs)   Types: Cigarettes   Start date: 04/14/1964   Quit date: 04/14/2017   Years since quitting: 5.8   Passive exposure: Past  Smokeless Tobacco Never  Tobacco Comments   Quit November 2018    Goals Met:  Independence with exercise equipment Exercise tolerated well No report of concerns or symptoms today Strength training completed today  Goals Unmet:  Not Applicable  Comments: Pt able to follow exercise prescription today without complaint.  Will continue to monitor for progression.   Dr. Bethann Punches is Medical Director for [facility] Cardiac Rehabilitation.  Dr. Vida Rigger is Medical Director for [facility] Pulmonary Rehabilitation.

## 2023-02-26 ENCOUNTER — Encounter: Payer: HMO | Admitting: *Deleted

## 2023-02-26 DIAGNOSIS — I214 Non-ST elevation (NSTEMI) myocardial infarction: Secondary | ICD-10-CM | POA: Diagnosis not present

## 2023-02-26 DIAGNOSIS — Z955 Presence of coronary angioplasty implant and graft: Secondary | ICD-10-CM

## 2023-02-26 NOTE — Progress Notes (Signed)
Daily Session Note  Patient Details  Name: Marc Gray MRN: [id number] Date of Birth: 09/25/1945 Referring Provider:   Flowsheet Row Cardiac Rehab from 02/14/2023 in [facility] Cardiac and Pulmonary Rehab  Referring Provider Dr. Marcina Millard       Encounter Date: 02/26/2023  Check In:  Session Check In - 02/26/23 1128       Check-In   Supervising physician immediately available to respond to emergencies See telemetry face sheet for immediately available ER MD    Location [facility]-Cardiac & Pulmonary Rehab    Staff Present Rory Percy, MS, Exercise Physiologist;Maxon Suzzette Righter, , Exercise Physiologist;Kelly Madilyn Fireman, BS, ACSM CEP, Exercise Physiologist;Sayvon Arterberry Katrinka Blazing, RN, ADN    Virtual Visit No    Medication changes reported     No    Fall or balance concerns reported    No    Warm-up and Cool-down Performed on first and last piece of equipment    Resistance Training Performed Yes    VAD Patient? No    PAD/SET Patient? No      Pain Assessment   Currently in Pain? No/denies                Social History   Tobacco Use  Smoking Status Former   Current packs/day: 0.00   Average packs/day: 1 pack/day for 53.0 years (53.0 ttl pk-yrs)   Types: Cigarettes   Start date: 04/14/1964   Quit date: 04/14/2017   Years since quitting: 5.8   Passive exposure: Past  Smokeless Tobacco Never  Tobacco Comments   Quit November 2018    Goals Met:  Independence with exercise equipment Exercise tolerated well No report of concerns or symptoms today Strength training completed today  Goals Unmet:  Not Applicable  Comments: Pt able to follow exercise prescription today without complaint.  Will continue to monitor for progression.    Dr. Bethann Punches is Medical Director for [facility] Cardiac Rehabilitation.  Dr. Vida Rigger is Medical Director for [facility] Pulmonary Rehabilitation.

## 2023-02-27 ENCOUNTER — Ambulatory Visit
Admission: RE | Admit: 2023-02-27 | Discharge: 2023-02-27 | Disposition: A | Discharge status: Home / Self Care | Payer: HMO | Source: Ambulatory Visit | Attending: Neurosurgery | Admitting: Neurosurgery

## 2023-02-27 DIAGNOSIS — M8580 Other specified disorders of bone density and structure, unspecified site: Secondary | ICD-10-CM | POA: Insufficient documentation

## 2023-02-27 DIAGNOSIS — M4316 Spondylolisthesis, lumbar region: Secondary | ICD-10-CM

## 2023-02-27 DIAGNOSIS — M436 Torticollis: Secondary | ICD-10-CM | POA: Diagnosis not present

## 2023-02-27 DIAGNOSIS — M85851 Other specified disorders of bone density and structure, right thigh: Secondary | ICD-10-CM | POA: Diagnosis not present

## 2023-02-27 DIAGNOSIS — M5001 Cervical disc disorder with myelopathy,  high cervical region: Secondary | ICD-10-CM | POA: Diagnosis not present

## 2023-02-27 DIAGNOSIS — G959 Disease of spinal cord, unspecified: Secondary | ICD-10-CM | POA: Diagnosis not present

## 2023-02-27 DIAGNOSIS — M810 Age-related osteoporosis without current pathological fracture: Secondary | ICD-10-CM | POA: Insufficient documentation

## 2023-02-27 DIAGNOSIS — M47811 Spondylosis without myelopathy or radiculopathy, occipito-atlanto-axial region: Secondary | ICD-10-CM | POA: Diagnosis not present

## 2023-02-27 DIAGNOSIS — M4726 Other spondylosis with radiculopathy, lumbar region: Secondary | ICD-10-CM | POA: Diagnosis not present

## 2023-02-27 DIAGNOSIS — M5116 Intervertebral disc disorders with radiculopathy, lumbar region: Secondary | ICD-10-CM | POA: Diagnosis not present

## 2023-02-27 DIAGNOSIS — M48061 Spinal stenosis, lumbar region without neurogenic claudication: Secondary | ICD-10-CM | POA: Diagnosis not present

## 2023-02-27 DIAGNOSIS — M5117 Intervertebral disc disorders with radiculopathy, lumbosacral region: Secondary | ICD-10-CM | POA: Diagnosis not present

## 2023-02-27 DIAGNOSIS — M4802 Spinal stenosis, cervical region: Secondary | ICD-10-CM | POA: Diagnosis not present

## 2023-02-28 ENCOUNTER — Encounter: Payer: HMO | Attending: Cardiology | Admitting: *Deleted

## 2023-02-28 ENCOUNTER — Encounter: Payer: Self-pay | Admitting: *Deleted

## 2023-02-28 DIAGNOSIS — Z955 Presence of coronary angioplasty implant and graft: Secondary | ICD-10-CM

## 2023-02-28 DIAGNOSIS — I214 Non-ST elevation (NSTEMI) myocardial infarction: Secondary | ICD-10-CM | POA: Insufficient documentation

## 2023-02-28 NOTE — Progress Notes (Signed)
Daily Session Note  Patient Details  Name: Marc Gray MRN: [id number] Date of Birth: Oct 10, 1945 Referring Provider:   Flowsheet Row Cardiac Rehab from 02/14/2023 in [facility] Cardiac and Pulmonary Rehab  Referring Provider Dr. Marcina Millard       Encounter Date: 02/28/2023  Check In:  Session Check In - 02/28/23 1039       Check-In   Supervising physician immediately available to respond to emergencies See telemetry face sheet for immediately available ER MD    Location [facility]-Cardiac & Pulmonary Rehab    Staff Present Rory Percy, MS, Exercise Physiologist;Joseph Shelbie Proctor, RN, ADN    Virtual Visit No    Medication changes reported     No    Fall or balance concerns reported    No    Warm-up and Cool-down Performed on first and last piece of equipment    Resistance Training Performed Yes    VAD Patient? No    PAD/SET Patient? No      Pain Assessment   Currently in Pain? No/denies                Social History   Tobacco Use  Smoking Status Former   Current packs/day: 0.00   Average packs/day: 1 pack/day for 53.0 years (53.0 ttl pk-yrs)   Types: Cigarettes   Start date: 04/14/1964   Quit date: 04/14/2017   Years since quitting: 5.8   Passive exposure: Past  Smokeless Tobacco Never  Tobacco Comments   Quit November 2018    Goals Met:  Independence with exercise equipment Exercise tolerated well No report of concerns or symptoms today Strength training completed today  Goals Unmet:  Not Applicable  Comments: Pt able to follow exercise prescription today without complaint.  Will continue to monitor for progression.    Dr. Bethann Punches is Medical Director for [facility] Cardiac Rehabilitation.  Dr. Vida Rigger is Medical Director for [facility] Pulmonary Rehabilitation.

## 2023-02-28 NOTE — Progress Notes (Signed)
Cardiac Individual Treatment Plan  Patient Details  Name: Marc Gray MRN: [id number] Date of Birth: Jun 23, 1945 Referring Provider:   Flowsheet Row Cardiac Rehab from 02/14/2023 in [facility] Cardiac and Pulmonary Rehab  Referring Provider Dr. Marcina Millard       Initial Encounter Date:  Flowsheet Row Cardiac Rehab from 02/14/2023 in [facility] Cardiac and Pulmonary Rehab  Date 02/14/23       Visit Diagnosis: Status post coronary artery stent placement  NSTEMI (non-ST elevated myocardial infarction) [facility])  Patient's Home Medications on Admission:  Current Outpatient Medications:    acetaminophen (TYLENOL) 500 MG tablet, Take 1,000 mg by mouth every 6 (six) hours as needed for moderate pain., Disp: , Rfl:    aspirin EC 81 MG tablet, Take 1 tablet (81 mg total) by mouth daily. Swallow whole., Disp: 90 tablet, Rfl: 0   Azelastine HCl 137 MCG/SPRAY SOLN, SMARTSIG:1-2 Spray(s) Both Nares Twice Daily, Disp: , Rfl:    BREZTRI AEROSPHERE 160-9-4.8 MCG/ACT AERO, Inhale 2 puffs into the lungs 2 (two) times daily., Disp: , Rfl:    calcium-vitamin D (OSCAL WITH D) 500-200 MG-UNIT tablet, Take 1 tablet by mouth daily., Disp: 90 tablet, Rfl: 1   diphenhydrAMINE-zinc acetate (BENADRYL EXTRA STRENGTH) cream, Apply 1 application topically 3 (three) times daily as needed for itching., Disp: 28 g, Rfl: 0   DUPIXENT 300 MG/2ML SOPN, Inject 300 mg into the skin every 14 (fourteen) days., Disp: , Rfl:    EPINEPHrine 0.3 mg/0.3 mL IJ SOAJ injection, Inject 0.3 mg into the muscle as needed., Disp: , Rfl:    fluticasone (FLONASE) 50 MCG/ACT nasal spray, Place 1 spray into both nostrils daily., Disp: 16 g, Rfl: 2   furosemide (LASIX) 20 MG tablet, Take by mouth., Disp: , Rfl:    Ipratropium-Albuterol (COMBIVENT) 20-100 MCG/ACT AERS respimat, Inhale into the lungs., Disp: , Rfl:    levothyroxine (SYNTHROID) 150 MCG tablet, Take 1 tablet by mouth daily., Disp: , Rfl:    Melatonin 10 MG TABS, Take 10  mg by mouth at bedtime. , Disp: , Rfl:    metoprolol tartrate (LOPRESSOR) 25 MG tablet, Take 0.5 tablets (12.5 mg total) by mouth 2 (two) times daily., Disp: 30 tablet, Rfl: 2   montelukast (SINGULAIR) 10 MG tablet, Take 10 mg by mouth daily., Disp: , Rfl:    Multiple Vitamin (MULTI-VITAMINS) TABS, Take 1 tablet by mouth daily. , Disp: , Rfl:    pantoprazole (PROTONIX) 40 MG tablet, Take 1 tablet (40 mg total) by mouth daily., Disp: 90 tablet, Rfl: 0   polyethylene glycol-electrolytes (NULYTELY) 420 g solution, MIX AND DRINK AS DIRECTED, Disp: , Rfl:    prasugrel (EFFIENT) 10 MG TABS tablet, Take 1 tablet (10 mg total) by mouth daily., Disp: 30 tablet, Rfl: 11   predniSONE (DELTASONE) 5 MG tablet, Take 5 mg by mouth daily., Disp: , Rfl:    prochlorperazine (COMPAZINE) 10 MG tablet, Take 1 tablet (10 mg total) by mouth every 6 (six) hours as needed for nausea or vomiting., Disp: 60 tablet, Rfl: 2   rosuvastatin (CRESTOR) 10 MG tablet, , Disp: , Rfl:    SHINGRIX injection, , Disp: , Rfl:    SPIKEVAX syringe, , Disp: , Rfl:    tadalafil (CIALIS) 5 MG tablet, Take 5-10mg  by mouth as needed prior to intercourse, Disp: 30 tablet, Rfl: 11   tamsulosin (FLOMAX) 0.4 MG CAPS capsule, Take 1 capsule (0.4 mg total) by mouth daily., Disp: 90 capsule, Rfl: 3   traZODone (DESYREL)  100 MG tablet, Take 100 mg by mouth at bedtime., Disp: , Rfl:    triamcinolone ointment (KENALOG) 0.5 %, Apply 1 application topically 3 (three) times daily as needed., Disp: 30 g, Rfl: 1 No current facility-administered medications for this visit.  Facility-Administered Medications Ordered in Other Visits:    sodium chloride flush (NS) 0.9 % injection 10 mL, 10 mL, Intravenous, PRN, Rickard Patience, MD, 10 mL at 04/03/17 6045  Past Medical History: Past Medical History:  Diagnosis Date   Anxiety    Aortic atherosclerosis (HCC)    Arthritis    SHOULDER   BPH (benign prostatic hyperplasia)    Cataract    Chronic kidney disease     low kidney function   Colon adenomas    Colon cancer (HCC) 2015   Pt states during his colonoscopy he had cancerous polyps removed.    COPD (chronic obstructive pulmonary disease) (HCC)    Depression    SINCE DIAGNOSIS   Dyspnea    DOE   Enlarged prostate    GERD (gastroesophageal reflux disease)    Hypertension    Hypothyroidism    Lung cancer (HCC)    Aug 2018   Pain    DUE TO LUNG ISSUE    Tobacco Use: Social History   Tobacco Use  Smoking Status Former   Current packs/day: 0.00   Average packs/day: 1 pack/day for 53.0 years (53.0 ttl pk-yrs)   Types: Cigarettes   Start date: 04/14/1964   Quit date: 04/14/2017   Years since quitting: 5.8   Passive exposure: Past  Smokeless Tobacco Never  Tobacco Comments   Quit November 2018    Labs: Review Flowsheet       Latest Ref Rng & Units 12/28/2022  Labs for ITP Cardiac and Pulmonary Rehab  Cholestrol 0 - 200 mg/dL 409   LDL (calc) 0 - 99 mg/dL 59   HDL-C >81 mg/dL 54   Trlycerides <191 mg/dL 74   Hemoglobin Y7W 4.8 - 5.6 % 5.8     Details             Exercise Target Goals: Exercise Program Goal: Individual exercise prescription set using results from initial 6 min walk test and THRR while considering  patient's activity barriers and safety.   Exercise Prescription Goal: Initial exercise prescription builds to 30-45 minutes a day of aerobic activity, 2-3 days per week.  Home exercise guidelines will be given to patient during program as part of exercise prescription that the participant will acknowledge.   Education: Aerobic Exercise: - Group verbal and visual presentation on the components of exercise prescription. Introduces F.I.T.T principle from ACSM for exercise prescriptions.  Reviews F.I.T.T. principles of aerobic exercise including progression. Written material given at graduation. Flowsheet Row Cardiac Rehab from 02/28/2023 in [facility] Cardiac and Pulmonary Rehab  Education need identified 02/14/23        Education: Resistance Exercise: - Group verbal and visual presentation on the components of exercise prescription. Introduces F.I.T.T principle from ACSM for exercise prescriptions  Reviews F.I.T.T. principles of resistance exercise including progression. Written material given at graduation.    Education: Exercise & Equipment Safety: - Individual verbal instruction and demonstration of equipment use and safety with use of the equipment. Flowsheet Row Cardiac Rehab from 02/28/2023 in [facility] Cardiac and Pulmonary Rehab  Date 02/14/23  Educator [facility]  Instruction Review Code 1- Verbalizes Understanding       Education: Exercise Physiology & General Exercise Guidelines: - Group verbal and written instruction with  models to review the exercise physiology of the cardiovascular system and associated critical values. Provides general exercise guidelines with specific guidelines to those with heart or lung disease.  Flowsheet Row Cardiac Rehab from 02/28/2023 in [facility] Cardiac and Pulmonary Rehab  Education need identified 02/14/23       Education: Flexibility, Balance, Mind/Body Relaxation: - Group verbal and visual presentation with interactive activity on the components of exercise prescription. Introduces F.I.T.T principle from ACSM for exercise prescriptions. Reviews F.I.T.T. principles of flexibility and balance exercise training including progression. Also discusses the mind body connection.  Reviews various relaxation techniques to help reduce and manage stress (i.e. Deep breathing, progressive muscle relaxation, and visualization). Balance handout provided to take home. Written material given at graduation.   Activity Barriers & Risk Stratification:  Activity Barriers & Cardiac Risk Stratification - 02/14/23 1351       Activity Barriers & Cardiac Risk Stratification   Activity Barriers Arthritis;Balance Concerns;Back Problems    Cardiac Risk Stratification High             6  Minute Walk:  6 Minute Walk     Row Name 02/14/23 1350         6 Minute Walk   Phase Initial     Distance 690 feet     Walk Time 6 minutes     # of Rest Breaks 0     MPH 1.31     METS 1.34     RPE 15     Perceived Dyspnea  3     VO2 Peak 4.68     Symptoms Yes (comment)     Comments back pain while walking 8/10 relieved with rest. SOB 3     Resting HR 61 bpm     Resting BP 138/70     Resting Oxygen Saturation  96 %     Exercise Oxygen Saturation  during 6 min walk 97 %     Max Ex. HR 109 bpm     Max Ex. BP 142/80     2 Minute Post BP 130/78              Oxygen Initial Assessment:   Oxygen Re-Evaluation:   Oxygen Discharge (Final Oxygen Re-Evaluation):   Initial Exercise Prescription:  Initial Exercise Prescription - 02/14/23 1300       Date of Initial Exercise RX and Referring Provider   Date 02/14/23    Referring Provider Dr. Marcina Millard      Recumbant Bike   Level 1    RPM 50    Watts 15    Minutes 15      NuStep   Level 1    SPM 80    Minutes 15    METs 1.34      REL-XR   Level 1    Speed 50    Minutes 15    METs 1.34      Biostep-RELP   Level 1    SPM 50    Minutes 15    METs 1.34      Track   Laps 10    Minutes 15    METs 1.54      Prescription Details   Frequency (times per week) 3    Duration Progress to 30 minutes of continuous aerobic without signs/symptoms of physical distress      Intensity   THRR 40-80% of Max Heartrate 94-127    Ratings of Perceived Exertion 11-13    Perceived Dyspnea  0-4      Progression   Progression Continue to progress workloads to maintain intensity without signs/symptoms of physical distress.      Resistance Training   Training Prescription Yes    Weight 3    Reps 10-15             Perform Capillary Blood Glucose checks as needed.  Exercise Prescription Changes:   Exercise Prescription Changes     Row Name 02/14/23 1300             Response to Exercise   Blood  Pressure (Admit) 138/70       Blood Pressure (Exercise) 142/80       Blood Pressure (Exit) 130/78       Heart Rate (Admit) 61 bpm       Heart Rate (Exercise) 109 bpm       Heart Rate (Exit) 66 bpm       Oxygen Saturation (Admit) 96 %       Oxygen Saturation (Exercise) 97 %       Oxygen Saturation (Exit) 96 %       Rating of Perceived Exertion (Exercise) 15       Perceived Dyspnea (Exercise) 3       Symptoms back pain 8/10 while walking and SOB       Comments 6 MWT results                Exercise Comments:   Exercise Comments     Row Name 02/19/23 1146           Exercise Comments First full day of exercise!  Patient was oriented to gym and equipment including functions, settings, policies, and procedures.  Patient's individual exercise prescription and treatment plan were reviewed.  All starting workloads were established based on the results of the 6 minute walk test done at initial orientation visit.  The plan for exercise progression was also introduced and progression will be customized based on patient's performance and goals.                Exercise Goals and Review:   Exercise Goals     Row Name 02/14/23 1356             Exercise Goals   Increase Physical Activity Yes       Intervention Provide advice, education, support and counseling about physical activity/exercise needs.;Develop an individualized exercise prescription for aerobic and resistive training based on initial evaluation findings, risk stratification, comorbidities and participant's personal goals.       Expected Outcomes Short Term: Attend rehab on a regular basis to increase amount of physical activity.;Long Term: Add in home exercise to make exercise part of routine and to increase amount of physical activity.;Long Term: Exercising regularly at least 3-5 days a week.       Increase Strength and Stamina Yes       Intervention Provide advice, education, support and counseling about physical  activity/exercise needs.;Develop an individualized exercise prescription for aerobic and resistive training based on initial evaluation findings, risk stratification, comorbidities and participant's personal goals.       Expected Outcomes Short Term: Increase workloads from initial exercise prescription for resistance, speed, and METs.;Short Term: Perform resistance training exercises routinely during rehab and add in resistance training at home;Long Term: Improve cardiorespiratory fitness, muscular endurance and strength as measured by increased METs and functional capacity ( )       Able to understand and use rate of perceived  exertion (RPE) scale Yes       Intervention Provide education and explanation on how to use RPE scale       Expected Outcomes Short Term: Able to use RPE daily in rehab to express subjective intensity level;Long Term:  Able to use RPE to guide intensity level when exercising independently       Able to understand and use Dyspnea scale Yes       Intervention Provide education and explanation on how to use Dyspnea scale       Expected Outcomes Short Term: Able to use Dyspnea scale daily in rehab to express subjective sense of shortness of breath during exertion;Long Term: Able to use Dyspnea scale to guide intensity level when exercising independently       Knowledge and understanding of Target Heart Rate Range (THRR) Yes       Intervention Provide education and explanation of THRR including how the numbers were predicted and where they are located for reference       Expected Outcomes Short Term: Able to state/look up THRR;Long Term: Able to use THRR to govern intensity when exercising independently;Short Term: Able to use daily as guideline for intensity in rehab       Able to check pulse independently Yes       Intervention Provide education and demonstration on how to check pulse in carotid and radial arteries.;Review the importance of being able to check your own pulse for  safety during independent exercise       Expected Outcomes Short Term: Able to explain why pulse checking is important during independent exercise;Long Term: Able to check pulse independently and accurately       Understanding of Exercise Prescription Yes       Intervention Provide education, explanation, and written materials on patient's individual exercise prescription       Expected Outcomes Short Term: Able to explain program exercise prescription;Long Term: Able to explain home exercise prescription to exercise independently                Exercise Goals Re-Evaluation :  Exercise Goals Re-Evaluation     Row Name 02/19/23 1146             Exercise Goal Re-Evaluation   Exercise Goals Review Able to understand and use rate of perceived exertion (RPE) scale;Able to understand and use Dyspnea scale;Knowledge and understanding of Target Heart Rate Range (THRR);Understanding of Exercise Prescription       Comments Reviewed RPE and dyspnea scale, THR and program prescription with pt today.  Pt voiced understanding and was given a copy of goals to take home.       Expected Outcomes Short: Use RPE daily to regulate intensity. Long: Follow program prescription in THR.                Discharge Exercise Prescription (Final Exercise Prescription Changes):  Exercise Prescription Changes - 02/14/23 1300       Response to Exercise   Blood Pressure (Admit) 138/70    Blood Pressure (Exercise) 142/80    Blood Pressure (Exit) 130/78    Heart Rate (Admit) 61 bpm    Heart Rate (Exercise) 109 bpm    Heart Rate (Exit) 66 bpm    Oxygen Saturation (Admit) 96 %    Oxygen Saturation (Exercise) 97 %    Oxygen Saturation (Exit) 96 %    Rating of Perceived Exertion (Exercise) 15    Perceived Dyspnea (Exercise) 3    Symptoms back  pain 8/10 while walking and SOB    Comments 6 MWT results             Nutrition:  Target Goals: Understanding of nutrition guidelines, daily intake of sodium  1500mg , cholesterol 200mg , calories 30% from fat and 7% or less from saturated fats, daily to have 5 or more servings of fruits and vegetables.  Education: All About Nutrition: -Group instruction provided by verbal, written material, interactive activities, discussions, models, and posters to present general guidelines for heart healthy nutrition including fat, fiber, MyPlate, the role of sodium in heart healthy nutrition, utilization of the nutrition label, and utilization of this knowledge for meal planning. Follow up email sent as well. Written material given at graduation.   Biometrics:  Pre Biometrics - 02/14/23 1357       Pre Biometrics   Height 6' (1.829 m)    Weight 249 lb 12.8 oz (113.3 kg)    Waist Circumference 52 inches    Hip Circumference 48 inches    Waist to Hip Ratio 1.08 %    BMI (Calculated) 33.87    Single Leg Stand 7.03 seconds              Nutrition Therapy Plan and Nutrition Goals:  Nutrition Therapy & Goals - 02/14/23 1335       Nutrition Therapy   Diet Cardiac, Low Na    Protein (specify units) 90    Fiber 30 grams    Whole Grain Foods 3 servings    Saturated Fats 15 max. grams    Fruits and Vegetables 5 servings/day    Sodium 2 grams      Personal Nutrition Goals   Nutrition Goal Drink 3 bottles of water daily    Personal Goal #2 Use healthy fats to promote hearth health, but watch calories    Personal Goal #3 Pair carb with protein at meal    Comments Patient drinking 2 bottles of water, ~32oz daily. Knows he needs to drink more, discussed goal of 3 bottles per day. He reports he tries to eat 3 meals per day. Sometimes eats late in the evening. Educated on [employer] handout, types of fats, sources, and how to read label. His nutrition knowledge was limited but kept things simple and used nutritional food groups sheet as reference to help him build more balanced meals with foods he likes and will focus on smaller portions of meat, leaner  meat choices, more colorful produce and complex carbs paired with good protein or healthy fats. Answered questions he had around, saturated fat, sodium, fiber and cholesterol.      Intervention Plan   Intervention Prescribe, educate and counsel regarding individualized specific dietary modifications aiming towards targeted core components such as weight, hypertension, lipid management, diabetes, heart failure and other comorbidities.;Nutrition handout(s) given to patient.    Expected Outcomes Short Term Goal: Understand basic principles of dietary content, such as calories, fat, sodium, cholesterol and nutrients.;Short Term Goal: A plan has been developed with personal nutrition goals set during dietitian appointment.;Long Term Goal: Adherence to prescribed nutrition plan.             Nutrition Assessments:  MEDIFICTS Score Key: >=70 Need to make dietary changes  40-70 Heart Healthy Diet <= 40 Therapeutic Level Cholesterol Diet  Flowsheet Row Cardiac Rehab from [facility]/14/2023 in [facility] Cardiac and Pulmonary Rehab  Picture Your Plate Total Score on Admission 45      Picture Your Plate Scores: <09 Unhealthy dietary pattern with much  room for improvement. 41-50 Dietary pattern unlikely to meet recommendations for good health and room for improvement. 51-60 More healthful dietary pattern, with some room for improvement.  >60 Healthy dietary pattern, although there may be some specific behaviors that could be improved.    Nutrition Goals Re-Evaluation:   Nutrition Goals Discharge (Final Nutrition Goals Re-Evaluation):   Psychosocial: Target Goals: Acknowledge presence or absence of significant depression and/or stress, maximize coping skills, provide positive support system. Participant is able to verbalize types and ability to use techniques and skills needed for reducing stress and depression.   Education: Stress, Anxiety, and Depression - Group verbal and visual presentation to define  topics covered.  Reviews how body is impacted by stress, anxiety, and depression.  Also discusses healthy ways to reduce stress and to treat/manage anxiety and depression.  Written material given at graduation.   Education: Sleep Hygiene -Provides group verbal and written instruction about how sleep can affect your health.  Define sleep hygiene, discuss sleep cycles and impact of sleep habits. Review good sleep hygiene tips.    Initial Review & Psychosocial Screening:  Initial Psych Review & Screening - 02/05/23 1028       Initial Review   Current issues with Current Anxiety/Panic      Family Dynamics   Good Support System? Yes    Comments Raiford Noble has intra family strains and can get depressed over his daughter that is off of drugs now but doesnt get to see his grandkids. He has his wife, daughter and good friend to look to for support.      Barriers   Psychosocial barriers to participate in program The patient should benefit from training in stress management and relaxation.;There are no identifiable barriers or psychosocial needs.      Screening Interventions   Interventions To provide support and resources with identified psychosocial needs;Provide feedback about the scores to participant;Encouraged to exercise    Expected Outcomes Short Term goal: Utilizing psychosocial counselor, staff and physician to assist with identification of specific Stressors or current issues interfering with healing process. Setting desired goal for each stressor or current issue identified.;Long Term Goal: Stressors or current issues are controlled or eliminated.;Short Term goal: Identification and review with participant of any Quality of Life or Depression concerns found by scoring the questionnaire.;Long Term goal: The participant improves quality of Life and PHQ9 Scores as seen by post scores and/or verbalization of changes             Quality of Life Scores:   Quality of Life - 02/14/23 1359        Quality of Life   Select Quality of Life      Quality of Life Scores   Health/Function Pre 15.5 %    Socioeconomic Pre 24.29 %    Psych/Spiritual Pre 24.64 %    Family Pre 25.2 %    GLOBAL Pre 20.62 %            Scores of 19 and below usually indicate a poorer quality of life in these areas.  A difference of  2-3 points is a clinically meaningful difference.  A difference of 2-3 points in the total score of the Quality of Life Index has been associated with significant improvement in overall quality of life, self-image, physical symptoms, and general health in studies assessing change in quality of life.  PHQ-9: Review Flowsheet       02/14/2023 07/02/2017  Depression screen PHQ 2/9  Decreased Interest 1 0  Down, Depressed, Hopeless 0 0  PHQ - 2 Score 1 0  Altered sleeping 1 -  Tired, decreased energy 1 -  Change in appetite 0 -  Feeling bad or failure about yourself  1 -  Trouble concentrating 0 -  Moving slowly or fidgety/restless 0 -  Suicidal thoughts 0 -  PHQ-9 Score 4 -  Difficult doing work/chores Somewhat difficult -    Details           Interpretation of Total Score  Total Score Depression Severity:  1-4 = Minimal depression, 5-9 = Mild depression, 10-14 = Moderate depression, 15-19 = Moderately severe depression, 20-27 = Severe depression   Psychosocial Evaluation and Intervention:  Psychosocial Evaluation - 02/05/23 1032       Psychosocial Evaluation & Interventions   Interventions Encouraged to exercise with the program and follow exercise prescription;Stress management education;Relaxation education    Comments Raiford Noble has intra family strains and can get depressed over his daughter that is off of drugs now but doesnt get to see his grandkids. He has his wife, daughter and good friend to look to for support.    Expected Outcomes Short: Start HeartTrack to help with mood. Long: Maintain a healthy mental state    Continue Psychosocial Services  Follow up  required by staff             Psychosocial Re-Evaluation:   Psychosocial Discharge (Final Psychosocial Re-Evaluation):   Vocational Rehabilitation: Provide vocational rehab assistance to qualifying candidates.   Vocational Rehab Evaluation & Intervention:   Education: Education Goals: Education classes will be provided on a variety of topics geared toward better understanding of heart health and risk factor modification. Participant will state understanding/return demonstration of topics presented as noted by education test scores.  Learning Barriers/Preferences:  Learning Barriers/Preferences - 02/05/23 1028       Learning Barriers/Preferences   Learning Barriers None    Learning Preferences None             General Cardiac Education Topics:  AED/CPR: - Group verbal and written instruction with the use of models to demonstrate the basic use of the AED with the basic ABC's of resuscitation.   Anatomy and Cardiac Procedures: - Group verbal and visual presentation and models provide information about basic cardiac anatomy and function. Reviews the testing methods done to diagnose heart disease and the outcomes of the test results. Describes the treatment choices: Medical Management, Angioplasty, or Coronary Bypass Surgery for treating various heart conditions including Myocardial Infarction, Angina, Valve Disease, and Cardiac Arrhythmias.  Written material given at graduation. Flowsheet Row Cardiac Rehab from 02/28/2023 in [facility] Cardiac and Pulmonary Rehab  Education need identified 02/14/23       Medication Safety: - Group verbal and visual instruction to review commonly prescribed medications for heart and lung disease. Reviews the medication, class of the drug, and side effects. Includes the steps to properly store meds and maintain the prescription regimen.  Written material given at graduation.   Intimacy: - Group verbal instruction through game format to  discuss how heart and lung disease can affect sexual intimacy. Written material given at graduation..   Know Your Numbers and Heart Failure: - Group verbal and visual instruction to discuss disease risk factors for cardiac and pulmonary disease and treatment options.  Reviews associated critical values for Overweight/Obesity, Hypertension, Cholesterol, and Diabetes.  Discusses basics of heart failure: signs/symptoms and treatments.  Introduces Heart Failure Zone chart for action plan for heart failure.  Written  material given at graduation. Flowsheet Row Cardiac Rehab from 02/28/2023 in [facility] Cardiac and Pulmonary Rehab  Education need identified 02/14/23  Date 02/28/23  Educator SB  Instruction Review Code 1- Verbalizes Understanding       Infection Prevention: - Provides verbal and written material to individual with discussion of infection control including proper hand washing and proper equipment cleaning during exercise session. Flowsheet Row Cardiac Rehab from 02/28/2023 in [facility] Cardiac and Pulmonary Rehab  Date 02/14/23  Educator [facility]  Instruction Review Code 1- Verbalizes Understanding       Falls Prevention: - Provides verbal and written material to individual with discussion of falls prevention and safety. Flowsheet Row Cardiac Rehab from 02/28/2023 in [facility] Cardiac and Pulmonary Rehab  Date 02/14/23  Educator [facility]  Instruction Review Code 1- Verbalizes Understanding       Other: -Provides group and verbal instruction on various topics (see comments)   Knowledge Questionnaire Score:  Knowledge Questionnaire Score - 02/14/23 1358       Knowledge Questionnaire Score   Pre Score 21/26             Core Components/Risk Factors/Patient Goals at Admission:  Personal Goals and Risk Factors at Admission - 02/14/23 1359       Core Components/Risk Factors/Patient Goals on Admission    Weight Management Yes;Weight Loss    Intervention Weight Management: Develop a  combined nutrition and exercise program designed to reach desired caloric intake, while maintaining appropriate intake of nutrient and fiber, sodium and fats, and appropriate energy expenditure required for the weight goal.;Weight Management: Provide education and appropriate resources to help participant work on and attain dietary goals.;Weight Management/Obesity: Establish reasonable short term and long term weight goals.;Obesity: Provide education and appropriate resources to help participant work on and attain dietary goals.    Admit Weight 249 lb 12.8 oz (113.3 kg)    Goal Weight: Short Term 240 lb (108.9 kg)    Goal Weight: Long Term 185 lb (83.9 kg)    Expected Outcomes Short Term: Continue to assess and modify interventions until short term weight is achieved;Long Term: Adherence to nutrition and physical activity/exercise program aimed toward attainment of established weight goal;Understanding recommendations for meals to include 15-35% energy as protein, 25-35% energy from fat, 35-60% energy from carbohydrates, less than 200mg  of dietary cholesterol, 20-35 gm of total fiber daily;Understanding of distribution of calorie intake throughout the day with the consumption of 4-5 meals/snacks;Weight Loss: Understanding of general recommendations for a balanced deficit meal plan, which promotes 1-2 lb weight loss per week and includes a negative energy balance of [phone] kcal/d    Heart Failure Yes    Intervention Provide a combined exercise and nutrition program that is supplemented with education, support and counseling about heart failure. Directed toward relieving symptoms such as shortness of breath, decreased exercise tolerance, and extremity edema.    Expected Outcomes Improve functional capacity of life;Short term: Attendance in program 2-3 days a week with increased exercise capacity. Reported lower sodium intake. Reported increased fruit and vegetable intake. Reports medication compliance.;Short  term: Daily weights obtained and reported for increase. Utilizing diuretic protocols set by physician.;Long term: Adoption of self-care skills and reduction of barriers for early signs and symptoms recognition and intervention leading to self-care maintenance.    Hypertension Yes    Intervention Provide education on lifestyle modifcations including regular physical activity/exercise, weight management, moderate sodium restriction and increased consumption of fresh fruit, vegetables, and low fat dairy, alcohol moderation, and smoking cessation.;Monitor  prescription use compliance.    Expected Outcomes Short Term: Continued assessment and intervention until BP is < 140/25mm HG in hypertensive participants. < 130/84mm HG in hypertensive participants with diabetes, heart failure or chronic kidney disease.;Long Term: Maintenance of blood pressure at goal levels.    Lipids Yes    Intervention Provide education and support for participant on nutrition & aerobic/resistive exercise along with prescribed medications to achieve LDL 70mg , HDL >40mg .    Expected Outcomes Short Term: Participant states understanding of desired cholesterol values and is compliant with medications prescribed. Participant is following exercise prescription and nutrition guidelines.;Long Term: Cholesterol controlled with medications as prescribed, with individualized exercise RX and with personalized nutrition plan. Value goals: LDL < 70mg , HDL > 40 mg.             Education:Diabetes - Individual verbal and written instruction to review signs/symptoms of diabetes, desired ranges of glucose level fasting, after meals and with exercise. Acknowledge that pre and post exercise glucose checks will be done for 3 sessions at entry of program.   Core Components/Risk Factors/Patient Goals Review:    Core Components/Risk Factors/Patient Goals at Discharge (Final Review):    ITP Comments:  ITP Comments     Row Name 02/05/23 1026  02/14/23 1348 02/19/23 1146 02/28/23 1223     ITP Comments Virtual Visit completed. Patient informed on EP and RD appointment and 6 Minute walk test. Patient also informed of patient health questionnaires on My Chart. Patient Verbalizes understanding. Visit diagnosis can be found in [facility] 12/27/[facility]22. Completed and gym orientation. Initial ITP created and sent for review to Dr. Bethann Punches, Medical Director. First full day of exercise!  Patient was oriented to gym and equipment including functions, settings, policies, and procedures.  Patient's individual exercise prescription and treatment plan were reviewed.  All starting workloads were established based on the results of the 6 minute walk test done at initial orientation visit.  The plan for exercise progression was also introduced and progression will be customized based on patient's performance and goals. 30 Day review completed. Medical Director ITP review done, changes made as directed, and signed approval by Medical Director.    new to program             Comments:

## 2023-03-01 DIAGNOSIS — G4733 Obstructive sleep apnea (adult) (pediatric): Secondary | ICD-10-CM | POA: Diagnosis not present

## 2023-03-01 DIAGNOSIS — Z85118 Personal history of other malignant neoplasm of bronchus and lung: Secondary | ICD-10-CM | POA: Diagnosis not present

## 2023-03-01 DIAGNOSIS — J4540 Moderate persistent asthma, uncomplicated: Secondary | ICD-10-CM | POA: Diagnosis not present

## 2023-03-01 DIAGNOSIS — Z6837 Body mass index (BMI) 37.0-37.9, adult: Secondary | ICD-10-CM | POA: Diagnosis not present

## 2023-03-01 DIAGNOSIS — I252 Old myocardial infarction: Secondary | ICD-10-CM | POA: Diagnosis not present

## 2023-03-01 DIAGNOSIS — N183 Chronic kidney disease, stage 3 unspecified: Secondary | ICD-10-CM | POA: Diagnosis not present

## 2023-03-01 DIAGNOSIS — J449 Chronic obstructive pulmonary disease, unspecified: Secondary | ICD-10-CM | POA: Diagnosis not present

## 2023-03-01 DIAGNOSIS — E2749 Other adrenocortical insufficiency: Secondary | ICD-10-CM | POA: Diagnosis not present

## 2023-03-01 DIAGNOSIS — E039 Hypothyroidism, unspecified: Secondary | ICD-10-CM | POA: Diagnosis not present

## 2023-03-01 DIAGNOSIS — D692 Other nonthrombocytopenic purpura: Secondary | ICD-10-CM | POA: Diagnosis not present

## 2023-03-01 DIAGNOSIS — J8283 Eosinophilic asthma: Secondary | ICD-10-CM | POA: Diagnosis not present

## 2023-03-02 ENCOUNTER — Encounter: Payer: HMO | Admitting: *Deleted

## 2023-03-02 DIAGNOSIS — Z955 Presence of coronary angioplasty implant and graft: Secondary | ICD-10-CM

## 2023-03-02 DIAGNOSIS — I214 Non-ST elevation (NSTEMI) myocardial infarction: Secondary | ICD-10-CM | POA: Diagnosis not present

## 2023-03-02 NOTE — Progress Notes (Signed)
Daily Session Note  Patient Details  Name: Marc Gray MRN: [id number] Date of Birth: 05-11-1946 Referring Provider:   Flowsheet Row Cardiac Rehab from 02/14/2023 in [facility] Cardiac and Pulmonary Rehab  Referring Provider Dr. Marcina Millard       Encounter Date: 03/02/2023  Check In:  Session Check In - 03/02/23 1139       Check-In   Supervising physician immediately available to respond to emergencies See telemetry face sheet for immediately available ER MD    Location [facility]-Cardiac & Pulmonary Rehab    Staff Present Cora Collum, RN, BSN, CCRP;Noah Tickle, BS, Exercise Physiologist;Joseph [place], [place]    Virtual Visit No    Medication changes reported     No    Fall or balance concerns reported    No    Warm-up and Cool-down Performed on first and last piece of equipment    Resistance Training Performed Yes    VAD Patient? No    PAD/SET Patient? No      Pain Assessment   Currently in Pain? No/denies                Social History   Tobacco Use  Smoking Status Former   Current packs/day: 0.00   Average packs/day: 1 pack/day for 53.0 years (53.0 ttl pk-yrs)   Types: Cigarettes   Start date: 04/14/1964   Quit date: 04/14/2017   Years since quitting: 5.8   Passive exposure: Past  Smokeless Tobacco Never  Tobacco Comments   Quit November 2018    Goals Met:  Independence with exercise equipment Exercise tolerated well No report of concerns or symptoms today  Goals Unmet:  Not Applicable  Comments: Pt able to follow exercise prescription today without complaint.  Will continue to monitor for progression.    Dr. Bethann Punches is Medical Director for [facility] Cardiac Rehabilitation.  Dr. Vida Rigger is Medical Director for [facility] Pulmonary Rehabilitation.

## 2023-03-05 ENCOUNTER — Encounter: Payer: HMO | Admitting: *Deleted

## 2023-03-05 DIAGNOSIS — I214 Non-ST elevation (NSTEMI) myocardial infarction: Secondary | ICD-10-CM | POA: Diagnosis not present

## 2023-03-05 DIAGNOSIS — Z955 Presence of coronary angioplasty implant and graft: Secondary | ICD-10-CM

## 2023-03-05 NOTE — Progress Notes (Signed)
Daily Session Note  Patient Details  Name: Marc Gray MRN: [id number] Date of Birth: [birth year] Referring Provider:   Flowsheet Row Cardiac Rehab from 02/14/2023 in [facility] Cardiac and Pulmonary Rehab  Referring Provider Dr. Marcina Millard       Encounter Date: 03/05/2023  Check In:  Session Check In - 03/05/23 1125       Check-In   Supervising physician immediately available to respond to emergencies See telemetry face sheet for immediately available ER MD    Location [facility]-Cardiac & Pulmonary Rehab    Staff Present Cora Collum, RN, BSN, CCRP;Margaret Best, MS, Exercise Physiologist;Maxon Conetta BS, , Exercise Physiologist;Kelly Madilyn Fireman, BS, ACSM CEP, Exercise Physiologist;Daniela Hernan Katrinka Blazing, RN, ADN    Virtual Visit No    Medication changes reported     No    Fall or balance concerns reported    No    Warm-up and Cool-down Performed on first and last piece of equipment    Resistance Training Performed Yes    VAD Patient? No    PAD/SET Patient? No      Pain Assessment   Currently in Pain? No/denies                Social History   Tobacco Use  Smoking Status Former   Current packs/day: 0.00   Average packs/day: 1 pack/day for 53.0 years (53.0 ttl pk-yrs)   Types: Cigarettes   Start date: 04/14/1964   Quit date: 04/14/2017   Years since quitting: 5.8   Passive exposure: Past  Smokeless Tobacco Never  Tobacco Comments   Quit November 2018    Goals Met:  Independence with exercise equipment Exercise tolerated well No report of concerns or symptoms today Strength training completed today  Goals Unmet:  Not Applicable  Comments: Pt able to follow exercise prescription today without complaint.  Will continue to monitor for progression.    Dr. Bethann Punches is Medical Director for [facility] Cardiac Rehabilitation.  Dr. Vida Rigger is Medical Director for [facility] Pulmonary Rehabilitation.

## 2023-03-07 ENCOUNTER — Encounter: Payer: HMO | Admitting: *Deleted

## 2023-03-07 DIAGNOSIS — Z955 Presence of coronary angioplasty implant and graft: Secondary | ICD-10-CM

## 2023-03-07 DIAGNOSIS — I214 Non-ST elevation (NSTEMI) myocardial infarction: Secondary | ICD-10-CM | POA: Diagnosis not present

## 2023-03-07 NOTE — Progress Notes (Signed)
Daily Session Note  Patient Details  Name: Marc Gray MRN: [id number] Date of Birth: Oct 03, 1945 Referring Provider:   Flowsheet Row Cardiac Rehab from 02/14/2023 in [facility] Cardiac and Pulmonary Rehab  Referring Provider Dr. Marcina Millard       Encounter Date: 03/07/2023  Check In:  Session Check In - 03/07/23 1151       Check-In   Supervising physician immediately available to respond to emergencies See telemetry face sheet for immediately available ER MD    Location [facility]-Cardiac & Pulmonary Rehab    Staff Present Rory Percy, MS, Exercise Physiologist;Meredith Jewel Baize, RN BSN;Joseph Reino Kent, Guinevere Ferrari, RN, ADN    Virtual Visit No    Medication changes reported     No    Fall or balance concerns reported    No    Warm-up and Cool-down Performed on first and last piece of equipment    Resistance Training Performed Yes    VAD Patient? No    PAD/SET Patient? No      Pain Assessment   Currently in Pain? No/denies                Social History   Tobacco Use  Smoking Status Former   Current packs/day: 0.00   Average packs/day: 1 pack/day for 53.0 years (53.0 ttl pk-yrs)   Types: Cigarettes   Start date: 04/14/1964   Quit date: 04/14/2017   Years since quitting: 5.8   Passive exposure: Past  Smokeless Tobacco Never  Tobacco Comments   Quit November 2018    Goals Met:  Independence with exercise equipment Exercise tolerated well No report of concerns or symptoms today Strength training completed today  Goals Unmet:  Not Applicable  Comments: Pt able to follow exercise prescription today without complaint.  Will continue to monitor for progression.    Dr. Bethann Punches is Medical Director for [facility] Cardiac Rehabilitation.  Dr. Vida Rigger is Medical Director for [facility] Pulmonary Rehabilitation.

## 2023-03-08 DIAGNOSIS — G4733 Obstructive sleep apnea (adult) (pediatric): Secondary | ICD-10-CM | POA: Diagnosis not present

## 2023-03-09 ENCOUNTER — Encounter: Payer: HMO | Admitting: *Deleted

## 2023-03-09 DIAGNOSIS — Z955 Presence of coronary angioplasty implant and graft: Secondary | ICD-10-CM

## 2023-03-09 DIAGNOSIS — I214 Non-ST elevation (NSTEMI) myocardial infarction: Secondary | ICD-10-CM | POA: Diagnosis not present

## 2023-03-09 NOTE — Progress Notes (Signed)
Daily Session Note  Patient Details  Name: Wong Steadham MRN: [id number] Date of Birth: 22-Aug-1945 Referring Provider:   Flowsheet Row Cardiac Rehab from 02/14/2023 in [facility] Cardiac and Pulmonary Rehab  Referring Provider Dr. Marcina Millard       Encounter Date: 03/09/2023  Check In:  Session Check In - 03/09/23 1159       Check-In   Supervising physician immediately available to respond to emergencies See telemetry face sheet for immediately available ER MD    Location [facility]-Cardiac & Pulmonary Rehab    Staff Present Bess Kinds RN, BSN;Margaret Best, MS, Exercise Physiologist;Maxon Conetta BS, , Exercise Physiologist;Noah Tickle, BS, Exercise Physiologist    Virtual Visit No    Medication changes reported     No    Fall or balance concerns reported    No    Warm-up and Cool-down Performed on first and last piece of equipment    Resistance Training Performed Yes    VAD Patient? No    PAD/SET Patient? No      Pain Assessment   Currently in Pain? No/denies                Social History   Tobacco Use  Smoking Status Former   Current packs/day: 0.00   Average packs/day: 1 pack/day for 53.0 years (53.0 ttl pk-yrs)   Types: Cigarettes   Start date: 04/14/1964   Quit date: 04/14/2017   Years since quitting: 5.9   Passive exposure: Past  Smokeless Tobacco Never  Tobacco Comments   Quit November 2018    Goals Met:  Independence with exercise equipment Exercise tolerated well No report of concerns or symptoms today Strength training completed today  Goals Unmet:  Not Applicable  Comments: Pt able to follow exercise prescription today without complaint.  Will continue to monitor for progression.    Dr. Bethann Punches is Medical Director for [facility] Cardiac Rehabilitation.  Dr. Vida Rigger is Medical Director for [facility] Pulmonary Rehabilitation.

## 2023-03-12 ENCOUNTER — Encounter: Payer: HMO | Admitting: *Deleted

## 2023-03-12 DIAGNOSIS — Z955 Presence of coronary angioplasty implant and graft: Secondary | ICD-10-CM

## 2023-03-12 DIAGNOSIS — I214 Non-ST elevation (NSTEMI) myocardial infarction: Secondary | ICD-10-CM

## 2023-03-12 NOTE — Progress Notes (Signed)
Daily Session Note  Patient Details  Name: Marc Gray MRN: [id number] Date of Birth: 04/22/1946 Referring Provider:   Flowsheet Row Cardiac Rehab from 02/14/2023 in [facility] Cardiac and Pulmonary Rehab  Referring Provider Dr. Marcina Millard       Encounter Date: 03/12/2023  Check In:  Session Check In - 03/12/23 1114       Check-In   Supervising physician immediately available to respond to emergencies See telemetry face sheet for immediately available ER MD    Location [facility]-Cardiac & Pulmonary Rehab    Staff Present Rory Percy, MS, Exercise Physiologist;Maxon Suzzette Righter, , Exercise Physiologist;Kelly Madilyn Fireman, BS, ACSM CEP, Exercise Physiologist;Charmine Bockrath Katrinka Blazing, RN, ADN    Virtual Visit No    Medication changes reported     No    Fall or balance concerns reported    No    Warm-up and Cool-down Performed on first and last piece of equipment    Resistance Training Performed Yes    VAD Patient? No    PAD/SET Patient? No      Pain Assessment   Currently in Pain? No/denies                Social History   Tobacco Use  Smoking Status Former   Current packs/day: 0.00   Average packs/day: 1 pack/day for 53.0 years (53.0 ttl pk-yrs)   Types: Cigarettes   Start date: 04/14/1964   Quit date: 04/14/2017   Years since quitting: 5.9   Passive exposure: Past  Smokeless Tobacco Never  Tobacco Comments   Quit November 2018    Goals Met:  Independence with exercise equipment Exercise tolerated well No report of concerns or symptoms today Strength training completed today  Goals Unmet:  Not Applicable  Comments: Pt able to follow exercise prescription today without complaint.  Will continue to monitor for progression.    Dr. Bethann Punches is Medical Director for [facility] Cardiac Rehabilitation.  Dr. Vida Rigger is Medical Director for [facility] Pulmonary Rehabilitation.

## 2023-03-14 ENCOUNTER — Encounter: Payer: HMO | Admitting: *Deleted

## 2023-03-14 DIAGNOSIS — I214 Non-ST elevation (NSTEMI) myocardial infarction: Secondary | ICD-10-CM

## 2023-03-14 DIAGNOSIS — Z955 Presence of coronary angioplasty implant and graft: Secondary | ICD-10-CM

## 2023-03-14 NOTE — Progress Notes (Signed)
Daily Session Note  Patient Details  Name: Marc Gray MRN: [id number] Date of Birth: 11-27-1945 Referring Provider:   Flowsheet Row Cardiac Rehab from 02/14/2023 in [facility] Cardiac and Pulmonary Rehab  Referring Provider Dr. Marcina Millard       Encounter Date: 03/14/2023  Check In:  Session Check In - 03/14/23 1109       Check-In   Supervising physician immediately available to respond to emergencies See telemetry face sheet for immediately available ER MD    Location [facility]-Cardiac & Pulmonary Rehab    Staff Present Cora Collum, RN, BSN, CCRP;Joseph Hood, RCP,RRT,BSRT;Maxon [place] BS, , Exercise Physiologist;Jasminemarie Sherrard Katrinka Blazing, RN, [place]    Virtual Visit No    Medication changes reported     No    Fall or balance concerns reported    No    Warm-up and Cool-down Performed on first and last piece of equipment    Resistance Training Performed Yes    VAD Patient? No    PAD/SET Patient? No      Pain Assessment   Currently in Pain? No/denies                Social History   Tobacco Use  Smoking Status Former   Current packs/day: 0.00   Average packs/day: 1 pack/day for 53.0 years (53.0 ttl pk-yrs)   Types: Cigarettes   Start date: 04/14/1964   Quit date: 04/14/2017   Years since quitting: 5.9   Passive exposure: Past  Smokeless Tobacco Never  Tobacco Comments   Quit November 2018    Goals Met:  Independence with exercise equipment Exercise tolerated well No report of concerns or symptoms today Strength training completed today  Goals Unmet:  Not Applicable  Comments: Pt able to follow exercise prescription today without complaint.  Will continue to monitor for progression.    Dr. Bethann Punches is Medical Director for [facility] Cardiac Rehabilitation.  Dr. Vida Rigger is Medical Director for [facility] Pulmonary Rehabilitation.

## 2023-03-15 DIAGNOSIS — J8283 Eosinophilic asthma: Secondary | ICD-10-CM | POA: Diagnosis not present

## 2023-03-15 DIAGNOSIS — J4540 Moderate persistent asthma, uncomplicated: Secondary | ICD-10-CM | POA: Diagnosis not present

## 2023-03-16 ENCOUNTER — Encounter: Payer: HMO | Admitting: *Deleted

## 2023-03-16 DIAGNOSIS — Z955 Presence of coronary angioplasty implant and graft: Secondary | ICD-10-CM

## 2023-03-16 DIAGNOSIS — I214 Non-ST elevation (NSTEMI) myocardial infarction: Secondary | ICD-10-CM | POA: Diagnosis not present

## 2023-03-16 NOTE — Progress Notes (Signed)
Daily Session Note  Patient Details  Name: Marc Gray MRN: [id number] Date of Birth: 1945/07/02 Referring Provider:   Flowsheet Row Cardiac Rehab from 02/14/2023 in [facility] Cardiac and Pulmonary Rehab  Referring Provider Dr. Marcina Millard       Encounter Date: 03/16/2023  Check In:  Session Check In - 03/16/23 1139       Check-In   Supervising physician immediately available to respond to emergencies See telemetry face sheet for immediately available ER MD    Location [facility]-Cardiac & Pulmonary Rehab    Staff Present Cora Collum, RN, BSN, CCRP;Joseph Hood, RCP,RRT,BSRT;Noah Tickle, [place], Exercise Physiologist    Virtual Visit No    Medication changes reported     No    Fall or balance concerns reported    No    Warm-up and Cool-down Performed on first and last piece of equipment    Resistance Training Performed Yes    VAD Patient? No    PAD/SET Patient? No      Pain Assessment   Currently in Pain? No/denies                Social History   Tobacco Use  Smoking Status Former   Current packs/day: 0.00   Average packs/day: 1 pack/day for 53.0 years (53.0 ttl pk-yrs)   Types: Cigarettes   Start date: 04/14/1964   Quit date: 04/14/2017   Years since quitting: 5.9   Passive exposure: Past  Smokeless Tobacco Never  Tobacco Comments   Quit November 2018    Goals Met:  Independence with exercise equipment Exercise tolerated well No report of concerns or symptoms today  Goals Unmet:  Not Applicable  Comments: Pt able to follow exercise prescription today without complaint.  Will continue to monitor for progression.    Dr. Bethann Punches is Medical Director for [facility] Cardiac Rehabilitation.  Dr. Vida Rigger is Medical Director for [facility] Pulmonary Rehabilitation.

## 2023-03-19 ENCOUNTER — Encounter: Payer: HMO | Admitting: *Deleted

## 2023-03-19 DIAGNOSIS — I214 Non-ST elevation (NSTEMI) myocardial infarction: Secondary | ICD-10-CM | POA: Diagnosis not present

## 2023-03-19 DIAGNOSIS — Z955 Presence of coronary angioplasty implant and graft: Secondary | ICD-10-CM

## 2023-03-19 NOTE — Progress Notes (Signed)
Daily Session Note  Patient Details  Name: Marc Gray MRN: [id number] Date of Birth: 06-12-1945 Referring Provider:   Flowsheet Row Cardiac Rehab from 02/14/2023 in [facility] Cardiac and Pulmonary Rehab  Referring Provider Dr. Marcina Millard       Encounter Date: 03/19/2023  Check In:  Session Check In - 03/19/23 1125       Check-In   Supervising physician immediately available to respond to emergencies See telemetry face sheet for immediately available ER MD    Location [facility]-Cardiac & Pulmonary Rehab    Staff Present Cora Collum, RN, BSN, CCRP;Margaret Best, MS, Exercise Physiologist;Maxon Conetta BS, , Exercise Physiologist;Kelly Madilyn Fireman, BS, ACSM CEP, Exercise Physiologist    Virtual Visit No    Medication changes reported     No    Fall or balance concerns reported    No    Warm-up and Cool-down Performed on first and last piece of equipment    Resistance Training Performed Yes    VAD Patient? No    PAD/SET Patient? No      Pain Assessment   Currently in Pain? No/denies                Social History   Tobacco Use  Smoking Status Former   Current packs/day: 0.00   Average packs/day: 1 pack/day for 53.0 years (53.0 ttl pk-yrs)   Types: Cigarettes   Start date: 04/14/1964   Quit date: 04/14/2017   Years since quitting: 5.9   Passive exposure: Past  Smokeless Tobacco Never  Tobacco Comments   Quit November 2018    Goals Met:  Independence with exercise equipment Exercise tolerated well No report of concerns or symptoms today  Goals Unmet:  Not Applicable  Comments: Pt able to follow exercise prescription today without complaint.  Will continue to monitor for progression.    Dr. Bethann Punches is Medical Director for [facility] Cardiac Rehabilitation.  Dr. Vida Rigger is Medical Director for [facility] Pulmonary Rehabilitation.

## 2023-03-21 ENCOUNTER — Encounter: Payer: HMO | Admitting: *Deleted

## 2023-03-21 DIAGNOSIS — Z955 Presence of coronary angioplasty implant and graft: Secondary | ICD-10-CM

## 2023-03-21 DIAGNOSIS — I214 Non-ST elevation (NSTEMI) myocardial infarction: Secondary | ICD-10-CM | POA: Diagnosis not present

## 2023-03-21 NOTE — Progress Notes (Signed)
Daily Session Note  Patient Details  Name: Marc Gray MRN: [id number] Date of Birth: 1946/01/14 Referring Provider:   Flowsheet Row Cardiac Rehab from 02/14/2023 in [facility] Cardiac and Pulmonary Rehab  Referring Provider Dr. Marcina Millard       Encounter Date: 03/21/2023  Check In:  Session Check In - 03/21/23 1159       Check-In   Supervising physician immediately available to respond to emergencies See telemetry face sheet for immediately available ER MD    Staff Present Cora Collum, RN, BSN, CCRP;Joseph Hood, RCP,RRT,BSRT;Maxon Payson BS, , Exercise Physiologist;Mitchael Luckey Katrinka Blazing, RN, [place]    Virtual Visit No    Medication changes reported     No    Fall or balance concerns reported    No    Warm-up and Cool-down Performed on first and last piece of equipment    Resistance Training Performed Yes    VAD Patient? No    PAD/SET Patient? No      Pain Assessment   Currently in Pain? No/denies                Social History   Tobacco Use  Smoking Status Former   Current packs/day: 0.00   Average packs/day: 1 pack/day for 53.0 years (53.0 ttl pk-yrs)   Types: Cigarettes   Start date: 04/14/1964   Quit date: 04/14/2017   Years since quitting: 5.9   Passive exposure: Past  Smokeless Tobacco Never  Tobacco Comments   Quit November 2018    Goals Met:  Independence with exercise equipment Exercise tolerated well No report of concerns or symptoms today Strength training completed today  Goals Unmet:  Not Applicable  Comments: Pt able to follow exercise prescription today without complaint.  Will continue to monitor for progression.    Dr. Bethann Punches is Medical Director for [facility] Cardiac Rehabilitation.  Dr. Vida Rigger is Medical Director for [facility] Pulmonary Rehabilitation.

## 2023-03-22 ENCOUNTER — Ambulatory Visit
Admission: RE | Admit: 2023-03-22 | Discharge: 2023-03-22 | Disposition: A | Discharge status: Home / Self Care | Payer: HMO | Source: Ambulatory Visit | Attending: Oncology | Admitting: Oncology

## 2023-03-22 DIAGNOSIS — K429 Umbilical hernia without obstruction or gangrene: Secondary | ICD-10-CM | POA: Diagnosis not present

## 2023-03-22 DIAGNOSIS — C3492 Malignant neoplasm of unspecified part of left bronchus or lung: Secondary | ICD-10-CM | POA: Insufficient documentation

## 2023-03-22 DIAGNOSIS — Z85118 Personal history of other malignant neoplasm of bronchus and lung: Secondary | ICD-10-CM | POA: Diagnosis not present

## 2023-03-22 DIAGNOSIS — G4733 Obstructive sleep apnea (adult) (pediatric): Secondary | ICD-10-CM | POA: Diagnosis not present

## 2023-03-22 DIAGNOSIS — J432 Centrilobular emphysema: Secondary | ICD-10-CM | POA: Diagnosis not present

## 2023-03-23 ENCOUNTER — Encounter: Payer: HMO | Admitting: *Deleted

## 2023-03-23 DIAGNOSIS — I214 Non-ST elevation (NSTEMI) myocardial infarction: Secondary | ICD-10-CM

## 2023-03-23 DIAGNOSIS — Z955 Presence of coronary angioplasty implant and graft: Secondary | ICD-10-CM

## 2023-03-23 NOTE — Progress Notes (Signed)
Daily Session Note  Patient Details  Name: Marc Gray MRN: [id number] Date of Birth: December 30, 1945 Referring Provider:   Flowsheet Row Cardiac Rehab from 02/14/2023 in [facility] Cardiac and Pulmonary Rehab  Referring Provider Dr. Marcina Millard       Encounter Date: 03/23/2023  Check In:  Session Check In - 03/23/23 1129       Check-In   Supervising physician immediately available to respond to emergencies See telemetry face sheet for immediately available ER MD    Location [facility]-Cardiac & Pulmonary Rehab    Staff Present Elige Ko, RCP,RRT,BSRT;Noah Tickle, BS, Exercise Physiologist;Poppy Mcafee, RN, BSN, CCRP    Virtual Visit No    Medication changes reported     No    Fall or balance concerns reported    No    Warm-up and Cool-down Performed on first and last piece of equipment    Resistance Training Performed Yes    VAD Patient? No    PAD/SET Patient? No      Pain Assessment   Currently in Pain? No/denies                Social History   Tobacco Use  Smoking Status Former   Current packs/day: 0.00   Average packs/day: 1 pack/day for 53.0 years (53.0 ttl pk-yrs)   Types: Cigarettes   Start date: 04/14/1964   Quit date: 04/14/2017   Years since quitting: 5.9   Passive exposure: Past  Smokeless Tobacco Never  Tobacco Comments   Quit November 2018    Goals Met:  Independence with exercise equipment Exercise tolerated well No report of concerns or symptoms today  Goals Unmet:  Not Applicable  Comments: Pt able to follow exercise prescription today without complaint.  Will continue to monitor for progression.    Dr. Bethann Punches is Medical Director for [facility] Cardiac Rehabilitation.  Dr. Vida Rigger is Medical Director for [facility] Pulmonary Rehabilitation.

## 2023-03-26 ENCOUNTER — Encounter: Payer: HMO | Admitting: *Deleted

## 2023-03-26 DIAGNOSIS — Z955 Presence of coronary angioplasty implant and graft: Secondary | ICD-10-CM

## 2023-03-26 DIAGNOSIS — I214 Non-ST elevation (NSTEMI) myocardial infarction: Secondary | ICD-10-CM

## 2023-03-26 NOTE — Progress Notes (Signed)
Daily Session Note  Patient Details  Name: Marc Gray MRN: [id number] Date of Birth: 01/05/1946 Referring Provider:   Flowsheet Row Cardiac Rehab from 02/14/2023 in [facility] Cardiac and Pulmonary Rehab  Referring Provider Dr. Marcina Millard       Encounter Date: 03/26/2023  Check In:  Session Check In - 03/26/23 1124       Check-In   Supervising physician immediately available to respond to emergencies See telemetry face sheet for immediately available ER MD    Location [facility]-Cardiac & Pulmonary Rehab    Staff Present Cora Collum, RN, BSN, CCRP;Margaret Best, MS, Exercise Physiologist;Meredith Jewel Baize, RN BSN;Maxon Conetta BS, , Exercise Physiologist;Kelly Madilyn Fireman, BS, ACSM CEP, Exercise Physiologist    Virtual Visit No    Medication changes reported     No    Fall or balance concerns reported    No    Warm-up and Cool-down Performed on first and last piece of equipment    Resistance Training Performed Yes    VAD Patient? No    PAD/SET Patient? No      Pain Assessment   Currently in Pain? No/denies                Social History   Tobacco Use  Smoking Status Former   Current packs/day: 0.00   Average packs/day: 1 pack/day for 53.0 years (53.0 ttl pk-yrs)   Types: Cigarettes   Start date: 04/14/1964   Quit date: 04/14/2017   Years since quitting: 5.9   Passive exposure: Past  Smokeless Tobacco Never  Tobacco Comments   Quit November 2018    Goals Met:  Independence with exercise equipment Exercise tolerated well No report of concerns or symptoms today  Goals Unmet:  Not Applicable  Comments: Pt able to follow exercise prescription today without complaint.  Will continue to monitor for progression.    Dr. Bethann Punches is Medical Director for [facility] Cardiac Rehabilitation.  Dr. Vida Rigger is Medical Director for [facility] Pulmonary Rehabilitation.

## 2023-03-28 ENCOUNTER — Encounter: Payer: Self-pay | Admitting: Oncology

## 2023-03-28 ENCOUNTER — Other Ambulatory Visit: Payer: Self-pay

## 2023-03-28 ENCOUNTER — Inpatient Hospital Stay: Payer: HMO | Attending: Oncology

## 2023-03-28 ENCOUNTER — Inpatient Hospital Stay (HOSPITAL_BASED_OUTPATIENT_CLINIC_OR_DEPARTMENT_OTHER): Payer: HMO | Admitting: Oncology

## 2023-03-28 ENCOUNTER — Encounter: Payer: Self-pay | Admitting: *Deleted

## 2023-03-28 VITALS — BP 113/65 | HR 66 | Temp 96.6°F | Resp 18 | Wt 258.9 lb

## 2023-03-28 DIAGNOSIS — D472 Monoclonal gammopathy: Secondary | ICD-10-CM | POA: Insufficient documentation

## 2023-03-28 DIAGNOSIS — Z87891 Personal history of nicotine dependence: Secondary | ICD-10-CM | POA: Diagnosis not present

## 2023-03-28 DIAGNOSIS — I214 Non-ST elevation (NSTEMI) myocardial infarction: Secondary | ICD-10-CM

## 2023-03-28 DIAGNOSIS — N1831 Chronic kidney disease, stage 3a: Secondary | ICD-10-CM | POA: Diagnosis not present

## 2023-03-28 DIAGNOSIS — Z85118 Personal history of other malignant neoplasm of bronchus and lung: Secondary | ICD-10-CM | POA: Insufficient documentation

## 2023-03-28 DIAGNOSIS — E032 Hypothyroidism due to medicaments and other exogenous substances: Secondary | ICD-10-CM

## 2023-03-28 DIAGNOSIS — Z7952 Long term (current) use of systemic steroids: Secondary | ICD-10-CM | POA: Insufficient documentation

## 2023-03-28 DIAGNOSIS — D509 Iron deficiency anemia, unspecified: Secondary | ICD-10-CM | POA: Diagnosis not present

## 2023-03-28 DIAGNOSIS — C3492 Malignant neoplasm of unspecified part of left bronchus or lung: Secondary | ICD-10-CM | POA: Diagnosis not present

## 2023-03-28 DIAGNOSIS — E2740 Unspecified adrenocortical insufficiency: Secondary | ICD-10-CM | POA: Insufficient documentation

## 2023-03-28 DIAGNOSIS — Z955 Presence of coronary angioplasty implant and graft: Secondary | ICD-10-CM

## 2023-03-28 DIAGNOSIS — Z8546 Personal history of malignant neoplasm of prostate: Secondary | ICD-10-CM | POA: Insufficient documentation

## 2023-03-28 DIAGNOSIS — Z803 Family history of malignant neoplasm of breast: Secondary | ICD-10-CM | POA: Diagnosis not present

## 2023-03-28 LAB — CMP (CANCER CENTER ONLY)
ALT: 22 U/L (ref 0–44)
AST: 20 U/L (ref 15–41)
Albumin: 3.5 g/dL (ref 3.5–5.0)
Alkaline Phosphatase: 34 U/L — ABNORMAL LOW (ref 38–126)
Anion gap: 7 (ref 5–15)
BUN: 34 mg/dL — ABNORMAL HIGH (ref 8–23)
CO2: 24 mmol/L (ref 22–32)
Calcium: 8.3 mg/dL — ABNORMAL LOW (ref 8.9–10.3)
Chloride: 108 mmol/L (ref 98–111)
Creatinine: 1.53 mg/dL — ABNORMAL HIGH (ref 0.61–1.24)
GFR, Estimated: 47 mL/min — ABNORMAL LOW (ref >60)
Glucose, Bld: 117 mg/dL — ABNORMAL HIGH (ref 70–99)
Potassium: 3.3 mmol/L — ABNORMAL LOW (ref 3.5–5.1)
Sodium: 139 mmol/L (ref 135–145)
Total Bilirubin: 0.6 mg/dL (ref 0.3–1.2)
Total Protein: 6.1 g/dL — ABNORMAL LOW (ref 6.5–8.1)

## 2023-03-28 LAB — CBC WITH DIFFERENTIAL (CANCER CENTER ONLY)
Abs Immature Granulocytes: 0.05 10*3/uL (ref 0.00–0.07)
Basophils Absolute: 0 10*3/uL (ref 0.0–0.1)
Basophils Relative: 1 %
Eosinophils Absolute: 0.1 10*3/uL (ref 0.0–0.5)
Eosinophils Relative: 2 %
HCT: 31.6 % — ABNORMAL LOW (ref 39.0–52.0)
Hemoglobin: 9.3 g/dL — ABNORMAL LOW (ref 13.0–17.0)
Immature Granulocytes: 1 %
Lymphocytes Relative: 19 %
Lymphs Abs: 1 10*3/uL (ref 0.7–4.0)
MCH: 24.8 pg — ABNORMAL LOW (ref 26.0–34.0)
MCHC: 29.4 g/dL — ABNORMAL LOW (ref 30.0–36.0)
MCV: 84.3 fL (ref 80.0–100.0)
Monocytes Absolute: 0.7 10*3/uL (ref 0.1–1.0)
Monocytes Relative: 13 %
Neutro Abs: 3.6 10*3/uL (ref 1.7–7.7)
Neutrophils Relative %: 64 %
Platelet Count: 185 10*3/uL (ref 150–400)
RBC: 3.75 MIL/uL — ABNORMAL LOW (ref 4.22–5.81)
RDW: 17.8 % — ABNORMAL HIGH (ref 11.5–15.5)
WBC Count: 5.6 10*3/uL (ref 4.0–10.5)
nRBC: 0 % (ref 0.0–0.2)

## 2023-03-28 LAB — RETIC PANEL
Immature Retic Fract: 32.4 % — ABNORMAL HIGH (ref 2.3–15.9)
RBC.: 3.75 MIL/uL — ABNORMAL LOW (ref 4.22–5.81)
Retic Count, Absolute: 80.6 10*3/uL (ref 19.0–186.0)
Retic Ct Pct: 2.2 % (ref 0.4–3.1)
Reticulocyte Hemoglobin: 20.6 pg — ABNORMAL LOW (ref >27.9)

## 2023-03-28 LAB — IRON AND TIBC
Iron: 28 ug/dL — ABNORMAL LOW (ref 45–182)
Saturation Ratios: 6 % — ABNORMAL LOW (ref 17.9–39.5)
TIBC: 445 ug/dL (ref 250–450)
UIBC: 417 ug/dL

## 2023-03-28 LAB — FERRITIN: Ferritin: 11 ng/mL — ABNORMAL LOW (ref 24–336)

## 2023-03-28 NOTE — Assessment & Plan Note (Addendum)
Lab Results  Component Value Date   HGB 9.3 (L) 03/28/2023   TIBC 445 03/28/2023   IRONPCTSAT 6 (L) 03/28/2023   FERRITIN 11 (L) 03/28/2023    Recommend IV venofer weekly x4.  I discussed about option IV Venofer I discussed about the potential risks including but not limited to allergic reactions/infusion reactions including anaphylactic reactions, phlebitis, high blood pressure, wheezing, SOB, skin rash, weight gain,dark urine, leg swelling, back pain, headache, nausea and fatigue, etc. Patient agrees with IV Venofer.  Plan IV venofer weekly x 4   Etiology of IDA is unknown. Recommend GI work up-possible GI blood loss due to dual antiplatelet therapy.

## 2023-03-28 NOTE — Assessment & Plan Note (Signed)
Continue prednisone Follow up with endocrinology

## 2023-03-28 NOTE — Assessment & Plan Note (Signed)
Continue Synthroid. Follow up with endocrinologist.

## 2023-03-28 NOTE — Assessment & Plan Note (Signed)
Today's lab result is pending  Observation.

## 2023-03-28 NOTE — Assessment & Plan Note (Signed)
#  Stage IIIB lung adenocarcinoma, s/p concurrent chemo/RT and immunotherapy, finished all treatment in January 2020  CT chest abdomen pelvis without contrast reviewed. No recurrence.

## 2023-03-28 NOTE — Progress Notes (Signed)
Cardiac Individual Treatment Plan  Patient Details  Name: Marc Gray MRN: [id number] Date of Birth: 11-15-1945 Referring Provider:   Flowsheet Row Cardiac Rehab from 02/14/2023 in [facility] Cardiac and Pulmonary Rehab  Referring Provider Dr. Marcina Millard       Initial Encounter Date:  Flowsheet Row Cardiac Rehab from 02/14/2023 in [facility] Cardiac and Pulmonary Rehab  Date 02/14/23       Visit Diagnosis: Status post coronary artery stent placement  NSTEMI (non-ST elevated myocardial infarction) [facility])  Patient's Home Medications on Admission:  Current Outpatient Medications:    acetaminophen (TYLENOL) 500 MG tablet, Take 1,000 mg by mouth every 6 (six) hours as needed for moderate pain., Disp: , Rfl:    aspirin EC 81 MG tablet, Take 1 tablet (81 mg total) by mouth daily. Swallow whole., Disp: 90 tablet, Rfl: 0   Azelastine HCl 137 MCG/SPRAY SOLN, SMARTSIG:1-2 Spray(s) Both Nares Twice Daily, Disp: , Rfl:    BREZTRI AEROSPHERE 160-9-4.8 MCG/ACT AERO, Inhale 2 puffs into the lungs 2 (two) times daily., Disp: , Rfl:    calcium-vitamin D (OSCAL WITH D) 500-200 MG-UNIT tablet, Take 1 tablet by mouth daily., Disp: 90 tablet, Rfl: 1   diphenhydrAMINE-zinc acetate (BENADRYL EXTRA STRENGTH) cream, Apply 1 application topically 3 (three) times daily as needed for itching., Disp: 28 g, Rfl: 0   DUPIXENT 300 MG/2ML SOPN, Inject 300 mg into the skin every 14 (fourteen) days., Disp: , Rfl:    EPINEPHrine 0.3 mg/0.3 mL IJ SOAJ injection, Inject 0.3 mg into the muscle as needed., Disp: , Rfl:    fluticasone (FLONASE) 50 MCG/ACT nasal spray, Place 1 spray into both nostrils daily., Disp: 16 g, Rfl: 2   furosemide (LASIX) 20 MG tablet, Take by mouth., Disp: , Rfl:    Ipratropium-Albuterol (COMBIVENT) 20-100 MCG/ACT AERS respimat, Inhale into the lungs., Disp: , Rfl:    levothyroxine (SYNTHROID) 137 MCG tablet, Take by mouth., Disp: , Rfl:    levothyroxine (SYNTHROID) 150 MCG tablet,  Take 1 tablet by mouth daily., Disp: , Rfl:    Melatonin 10 MG TABS, Take 10 mg by mouth at bedtime. , Disp: , Rfl:    metoprolol tartrate (LOPRESSOR) 25 MG tablet, Take 0.5 tablets (12.5 mg total) by mouth 2 (two) times daily., Disp: 30 tablet, Rfl: 2   montelukast (SINGULAIR) 10 MG tablet, Take 10 mg by mouth daily., Disp: , Rfl:    Multiple Vitamin (MULTI-VITAMINS) TABS, Take 1 tablet by mouth daily. , Disp: , Rfl:    pantoprazole (PROTONIX) 40 MG tablet, Take 1 tablet (40 mg total) by mouth daily., Disp: 90 tablet, Rfl: 0   polyethylene glycol-electrolytes (NULYTELY) 420 g solution, MIX AND DRINK AS DIRECTED, Disp: , Rfl:    prasugrel (EFFIENT) 10 MG TABS tablet, Take 1 tablet (10 mg total) by mouth daily., Disp: 30 tablet, Rfl: 11   predniSONE (DELTASONE) 5 MG tablet, Take 5 mg by mouth daily., Disp: , Rfl:    prochlorperazine (COMPAZINE) 10 MG tablet, Take 1 tablet (10 mg total) by mouth every 6 (six) hours as needed for nausea or vomiting., Disp: 60 tablet, Rfl: 2   rosuvastatin (CRESTOR) 10 MG tablet, , Disp: , Rfl:    SHINGRIX injection, , Disp: , Rfl:    SPIKEVAX syringe, , Disp: , Rfl:    tadalafil (CIALIS) 5 MG tablet, Take 5-10mg  by mouth as needed prior to intercourse, Disp: 30 tablet, Rfl: 11   tamsulosin (FLOMAX) 0.4 MG CAPS capsule, Take 1 capsule (0.4  mg total) by mouth daily., Disp: 90 capsule, Rfl: 3   traZODone (DESYREL) 100 MG tablet, Take 100 mg by mouth at bedtime., Disp: , Rfl:    triamcinolone ointment (KENALOG) 0.5 %, Apply 1 application topically 3 (three) times daily as needed., Disp: 30 g, Rfl: 1 No current facility-administered medications for this visit.  Facility-Administered Medications Ordered in Other Visits:    sodium chloride flush (NS) 0.9 % injection 10 mL, 10 mL, Intravenous, PRN, Rickard Patience, MD, 10 mL at 04/03/17 1027  Past Medical History: Past Medical History:  Diagnosis Date   Anxiety    Aortic atherosclerosis (HCC)    Arthritis    SHOULDER    BPH (benign prostatic hyperplasia)    Cataract    Chronic kidney disease    low kidney function   Colon adenomas    Colon cancer (HCC) 2015   Pt states during his colonoscopy he had cancerous polyps removed.    COPD (chronic obstructive pulmonary disease) (HCC)    Depression    SINCE DIAGNOSIS   Dyspnea    DOE   Enlarged prostate    GERD (gastroesophageal reflux disease)    Hypertension    Hypothyroidism    Lung cancer (HCC)    Aug 2018   Pain    DUE TO LUNG ISSUE    Tobacco Use: Social History   Tobacco Use  Smoking Status Former   Current packs/day: 0.00   Average packs/day: 1 pack/day for 53.0 years (53.0 ttl pk-yrs)   Types: Cigarettes   Start date: 04/14/1964   Quit date: 04/14/2017   Years since quitting: 5.9   Passive exposure: Past  Smokeless Tobacco Never  Tobacco Comments   Quit November 2018    Labs: Review Flowsheet       Latest Ref Rng & Units 12/28/2022  Labs for ITP Cardiac and Pulmonary Rehab  Cholestrol 0 - 200 mg/dL 253   LDL (calc) 0 - 99 mg/dL 59   HDL-C >66 mg/dL 54   Trlycerides <440 mg/dL 74   Hemoglobin H4V 4.8 - 5.6 % 5.8     Details             Exercise Target Goals: Exercise Program Goal: Individual exercise prescription set using results from initial 6 min walk test and THRR while considering  patient's activity barriers and safety.   Exercise Prescription Goal: Initial exercise prescription builds to 30-45 minutes a day of aerobic activity, 2-3 days per week.  Home exercise guidelines will be given to patient during program as part of exercise prescription that the participant will acknowledge.   Education: Aerobic Exercise: - Group verbal and visual presentation on the components of exercise prescription. Introduces F.I.T.T principle from ACSM for exercise prescriptions.  Reviews F.I.T.T. principles of aerobic exercise including progression. Written material given at graduation. Flowsheet Row Cardiac Rehab from  03/21/2023 in [facility] Cardiac and Pulmonary Rehab  Education need identified 02/14/23       Education: Resistance Exercise: - Group verbal and visual presentation on the components of exercise prescription. Introduces F.I.T.T principle from ACSM for exercise prescriptions  Reviews F.I.T.T. principles of resistance exercise including progression. Written material given at graduation. Flowsheet Row Cardiac Rehab from 03/21/2023 in [facility] Cardiac and Pulmonary Rehab  Date 03/21/23  Educator NT  Instruction Review Code 1- Verbalizes Understanding        Education: Exercise & Equipment Safety: - Individual verbal instruction and demonstration of equipment use and safety with use of the equipment. Flowsheet Row  Cardiac Rehab from 03/21/2023 in [facility] Cardiac and Pulmonary Rehab  Date 02/14/23  Educator [facility]  Instruction Review Code 1- Verbalizes Understanding       Education: Exercise Physiology & General Exercise Guidelines: - Group verbal and written instruction with models to review the exercise physiology of the cardiovascular system and associated critical values. Provides general exercise guidelines with specific guidelines to those with heart or lung disease.  Flowsheet Row Cardiac Rehab from 03/21/2023 in [facility] Cardiac and Pulmonary Rehab  Education need identified 02/14/23  Date 03/14/23  Educator NT  Instruction Review Code 1- [employer] Understanding       Education: Flexibility, Balance, Mind/Body Relaxation: - Group verbal and visual presentation with interactive activity on the components of exercise prescription. Introduces F.I.T.T principle from ACSM for exercise prescriptions. Reviews F.I.T.T. principles of flexibility and balance exercise training including progression. Also discusses the mind body connection.  Reviews various relaxation techniques to help reduce and manage stress (i.e. Deep breathing, progressive muscle relaxation, and visualization). Balance handout provided  to take home. Written material given at graduation. Flowsheet Row Cardiac Rehab from 03/21/2023 in [facility] Cardiac and Pulmonary Rehab  Date 03/21/23  Educator NT  Instruction Review Code 1- Verbalizes Understanding       Activity Barriers & Risk Stratification:  Activity Barriers & Cardiac Risk Stratification - 02/14/23 1351       Activity Barriers & Cardiac Risk Stratification   Activity Barriers Arthritis;Balance Concerns;Back Problems    Cardiac Risk Stratification High             6 Minute Walk:  6 Minute Walk     Row Name 02/14/23 1350         6 Minute Walk   Phase Initial     Distance 690 feet     Walk Time 6 minutes     # of Rest Breaks 0     MPH 1.31     METS 1.34     RPE 15     Perceived Dyspnea  3     VO2 Peak 4.68     Symptoms Yes (comment)     Comments back pain while walking 8/10 relieved with rest. SOB 3     Resting HR 61 bpm     Resting BP 138/70     Resting Oxygen Saturation  96 %     Exercise Oxygen Saturation  during 6 min walk 97 %     Max Ex. HR 109 bpm     Max Ex. BP 142/80     2 Minute Post BP 130/78              Oxygen Initial Assessment:   Oxygen Re-Evaluation:   Oxygen Discharge (Final Oxygen Re-Evaluation):   Initial Exercise Prescription:  Initial Exercise Prescription - 02/14/23 1300       Date of Initial Exercise RX and Referring Provider   Date 02/14/23    Referring Provider Dr. Marcina Millard      Recumbant Bike   Level 1    RPM 50    Watts 15    Minutes 15      NuStep   Level 1    SPM 80    Minutes 15    METs 1.34      REL-XR   Level 1    Speed 50    Minutes 15    METs 1.34      Biostep-RELP   Level 1    SPM 50  Minutes 15    METs 1.34      Track   Laps 10    Minutes 15    METs 1.54      Prescription Details   Frequency (times per week) 3    Duration Progress to 30 minutes of continuous aerobic without signs/symptoms of physical distress      Intensity   THRR 40-80% of Max  Heartrate 94-127    Ratings of Perceived Exertion 11-13    Perceived Dyspnea 0-4      Progression   Progression Continue to progress workloads to maintain intensity without signs/symptoms of physical distress.      Resistance Training   Training Prescription Yes    Weight 3    Reps 10-15             Perform Capillary Blood Glucose checks as needed.  Exercise Prescription Changes:   Exercise Prescription Changes     Row Name 02/14/23 1300 03/06/23 0700 03/21/23 1100         Response to Exercise   Blood Pressure (Admit) 138/70 118/62 116/58     Blood Pressure (Exercise) 142/80 142/68 128/64     Blood Pressure (Exit) 130/78 138/70 138/62     Heart Rate (Admit) 61 bpm 74 bpm 86 bpm     Heart Rate (Exercise) 109 bpm 113 bpm 103 bpm     Heart Rate (Exit) 66 bpm 72 bpm 93 bpm     Oxygen Saturation (Admit) 96 % -- --     Oxygen Saturation (Exercise) 97 % -- --     Oxygen Saturation (Exit) 96 % -- --     Rating of Perceived Exertion (Exercise) 15 15 14      Perceived Dyspnea (Exercise) 3 0 --     Symptoms back pain 8/10 while walking and SOB -- none     Comments 6 MWT results -- --     Duration -- Progress to 30 minutes of  aerobic without signs/symptoms of physical distress Progress to 30 minutes of  aerobic without signs/symptoms of physical distress     Intensity -- THRR unchanged THRR unchanged       Progression   Progression -- Continue to progress workloads to maintain intensity without signs/symptoms of physical distress. Continue to progress workloads to maintain intensity without signs/symptoms of physical distress.     Average METs -- 2.5 2.4       Resistance Training   Training Prescription -- Yes Yes     Weight -- 3 lb 3 lb     Reps -- 10-15 10-15       Interval Training   Interval Training -- No No       Recumbant Bike   Level -- 4 2     Watts -- 15 18     Minutes -- 15 15       NuStep   Level -- 4 2     Minutes -- 15 15     METs -- 3.3 3.4        REL-XR   Level -- 2 --     Minutes -- 15 --     METs -- 3.4 --       T5 Nustep   Level -- -- 2     Minutes -- -- 15     METs -- -- 2.1       Biostep-RELP   Level -- 1 1     Minutes -- 15 15  METs -- 2 2       Track   Laps -- 16 --     Minutes -- 15 --     METs -- 1.87 --       Oxygen   Maintain Oxygen Saturation -- 88% or higher 88% or higher              Exercise Comments:   Exercise Comments     Row Name 02/19/23 1146           Exercise Comments First full day of exercise!  Patient was oriented to gym and equipment including functions, settings, policies, and procedures.  Patient's individual exercise prescription and treatment plan were reviewed.  All starting workloads were established based on the results of the 6 minute walk test done at initial orientation visit.  The plan for exercise progression was also introduced and progression will be customized based on patient's performance and goals.                Exercise Goals and Review:   Exercise Goals     Row Name 02/14/23 1356             Exercise Goals   Increase Physical Activity Yes       Intervention Provide advice, education, support and counseling about physical activity/exercise needs.;Develop an individualized exercise prescription for aerobic and resistive training based on initial evaluation findings, risk stratification, comorbidities and participant's personal goals.       Expected Outcomes Short Term: Attend rehab on a regular basis to increase amount of physical activity.;Long Term: Add in home exercise to make exercise part of routine and to increase amount of physical activity.;Long Term: Exercising regularly at least 3-5 days a week.       Increase Strength and Stamina Yes       Intervention Provide advice, education, support and counseling about physical activity/exercise needs.;Develop an individualized exercise prescription for aerobic and resistive training based on initial  evaluation findings, risk stratification, comorbidities and participant's personal goals.       Expected Outcomes Short Term: Increase workloads from initial exercise prescription for resistance, speed, and METs.;Short Term: Perform resistance training exercises routinely during rehab and add in resistance training at home;Long Term: Improve cardiorespiratory fitness, muscular endurance and strength as measured by increased METs and functional capacity ( )       Able to understand and use rate of perceived exertion (RPE) scale Yes       Intervention Provide education and explanation on how to use RPE scale       Expected Outcomes Short Term: Able to use RPE daily in rehab to express subjective intensity level;Long Term:  Able to use RPE to guide intensity level when exercising independently       Able to understand and use Dyspnea scale Yes       Intervention Provide education and explanation on how to use Dyspnea scale       Expected Outcomes Short Term: Able to use Dyspnea scale daily in rehab to express subjective sense of shortness of breath during exertion;Long Term: Able to use Dyspnea scale to guide intensity level when exercising independently       Knowledge and understanding of Target Heart Rate Range (THRR) Yes       Intervention Provide education and explanation of THRR including how the numbers were predicted and where they are located for reference       Expected Outcomes Short Term: Able to state/look up  THRR;Long Term: Able to use THRR to govern intensity when exercising independently;Short Term: Able to use daily as guideline for intensity in rehab       Able to check pulse independently Yes       Intervention Provide education and demonstration on how to check pulse in carotid and radial arteries.;Review the importance of being able to check your own pulse for safety during independent exercise       Expected Outcomes Short Term: Able to explain why pulse checking is important  during independent exercise;Long Term: Able to check pulse independently and accurately       Understanding of Exercise Prescription Yes       Intervention Provide education, explanation, and written materials on patient's individual exercise prescription       Expected Outcomes Short Term: Able to explain program exercise prescription;Long Term: Able to explain home exercise prescription to exercise independently                Exercise Goals Re-Evaluation :  Exercise Goals Re-Evaluation     Row Name 02/19/23 1146 03/06/23 0746 03/21/23 1103         Exercise Goal Re-Evaluation   Exercise Goals Review Able to understand and use rate of perceived exertion (RPE) scale;Able to understand and use Dyspnea scale;Knowledge and understanding of Target Heart Rate Range (THRR);Understanding of Exercise Prescription Increase Physical Activity;Increase Strength and Stamina;Understanding of Exercise Prescription Increase Physical Activity;Increase Strength and Stamina;Understanding of Exercise Prescription     Comments Reviewed RPE and dyspnea scale, THR and program prescription with pt today.  Pt voiced understanding and was given a copy of goals to take home. Raiford Noble is off to a good start in the program. He recently completed his first 2 weeks of exercise. During his first few sessions he was able to increase his level on the T4 nustep to level 4, and increase his level on the recumbent bike to level 4. We will continue to monitor his progress in the program. Raiford Noble is doing well in the program. He continues to work at level 2 on the recumbent bike, T4 nustep, T5 nustep and level 1 on the biostep. He has not done any walking in the program since the last review, but has done well with 3 lb hand weights for resistance training. We will continue to monitor his progress in the program.     Expected Outcomes Short: Use RPE daily to regulate intensity. Long: Follow program prescription in THR. Short: Continue to  follow current exercise prescription. Long: Continue exercise to improve strength and stamina. Short: Begin walking the track as part of exercise prescription. Long: Continue exercise to improve strength and stamina.              Discharge Exercise Prescription (Final Exercise Prescription Changes):  Exercise Prescription Changes - 03/21/23 1100       Response to Exercise   Blood Pressure (Admit) 116/58    Blood Pressure (Exercise) 128/64    Blood Pressure (Exit) 138/62    Heart Rate (Admit) 86 bpm    Heart Rate (Exercise) 103 bpm    Heart Rate (Exit) 93 bpm    Rating of Perceived Exertion (Exercise) 14    Symptoms none    Duration Progress to 30 minutes of  aerobic without signs/symptoms of physical distress    Intensity THRR unchanged      Progression   Progression Continue to progress workloads to maintain intensity without signs/symptoms of physical distress.    Average  METs 2.4      Resistance Training   Training Prescription Yes    Weight 3 lb    Reps 10-15      Interval Training   Interval Training No      Recumbant Bike   Level 2    Watts 18    Minutes 15      NuStep   Level 2    Minutes 15    METs 3.4      T5 Nustep   Level 2    Minutes 15    METs 2.1      Biostep-RELP   Level 1    Minutes 15    METs 2      Oxygen   Maintain Oxygen Saturation 88% or higher             Nutrition:  Target Goals: Understanding of nutrition guidelines, daily intake of sodium 1500mg , cholesterol 200mg , calories 30% from fat and 7% or less from saturated fats, daily to have 5 or more servings of fruits and vegetables.  Education: All About Nutrition: -Group instruction provided by verbal, written material, interactive activities, discussions, models, and posters to present general guidelines for heart healthy nutrition including fat, fiber, MyPlate, the role of sodium in heart healthy nutrition, utilization of the nutrition label, and utilization of this  knowledge for meal planning. Follow up email sent as well. Written material given at graduation.   Biometrics:  Pre Biometrics - 02/14/23 1357       Pre Biometrics   Height 6' (1.829 m)    Weight 249 lb 12.8 oz (113.3 kg)    Waist Circumference 52 inches    Hip Circumference 48 inches    Waist to Hip Ratio 1.08 %    BMI (Calculated) 33.87    Single Leg Stand 7.03 seconds              Nutrition Therapy Plan and Nutrition Goals:  Nutrition Therapy & Goals - 02/14/23 1335       Nutrition Therapy   Diet Cardiac, Low Na    Protein (specify units) 90    Fiber 30 grams    Whole Grain Foods 3 servings    Saturated Fats 15 max. grams    Fruits and Vegetables 5 servings/day    Sodium 2 grams      Personal Nutrition Goals   Nutrition Goal Drink 3 bottles of water daily    Personal Goal #2 Use healthy fats to promote hearth health, but watch calories    Personal Goal #3 Pair carb with protein at meal    Comments Patient drinking 2 bottles of water, ~32oz daily. Knows he needs to drink more, discussed goal of 3 bottles per day. He reports he tries to eat 3 meals per day. Sometimes eats late in the evening. Educated on [employer] handout, types of fats, sources, and how to read label. His nutrition knowledge was limited but kept things simple and used nutritional food groups sheet as reference to help him build more balanced meals with foods he likes and will focus on smaller portions of meat, leaner meat choices, more colorful produce and complex carbs paired with good protein or healthy fats. Answered questions he had around, saturated fat, sodium, fiber and cholesterol.      Intervention Plan   Intervention Prescribe, educate and counsel regarding individualized specific dietary modifications aiming towards targeted core components such as weight, hypertension, lipid management, diabetes, heart failure and other comorbidities.;Nutrition handout(s)  given to patient.     Expected Outcomes Short Term Goal: Understand basic principles of dietary content, such as calories, fat, sodium, cholesterol and nutrients.;Short Term Goal: A plan has been developed with personal nutrition goals set during dietitian appointment.;Long Term Goal: Adherence to prescribed nutrition plan.             Nutrition Assessments:  MEDIFICTS Score Key: >=70 Need to make dietary changes  40-70 Heart Healthy Diet <= 40 Therapeutic Level Cholesterol Diet  Flowsheet Row Cardiac Rehab from 02/14/2023 in [facility] Cardiac and Pulmonary Rehab  Picture Your Plate Total Score on Admission 45      Picture Your Plate Scores: <16 Unhealthy dietary pattern with much room for improvement. 41-50 Dietary pattern unlikely to meet recommendations for good health and room for improvement. 51-60 More healthful dietary pattern, with some room for improvement.  >60 Healthy dietary pattern, although there may be some specific behaviors that could be improved.    Nutrition Goals Re-Evaluation:   Nutrition Goals Discharge (Final Nutrition Goals Re-Evaluation):   Psychosocial: Target Goals: Acknowledge presence or absence of significant depression and/or stress, maximize coping skills, provide positive support system. Participant is able to verbalize types and ability to use techniques and skills needed for reducing stress and depression.   Education: Stress, Anxiety, and Depression - Group verbal and visual presentation to define topics covered.  Reviews how body is impacted by stress, anxiety, and depression.  Also discusses healthy ways to reduce stress and to treat/manage anxiety and depression.  Written material given at graduation. Flowsheet Row Cardiac Rehab from 03/21/2023 in [facility] Cardiac and Pulmonary Rehab  Date 03/07/23  Educator SB  Instruction Review Code 1- [employer] Understanding       Education: Sleep Hygiene -Provides group verbal and written instruction about how sleep can  affect your health.  Define sleep hygiene, discuss sleep cycles and impact of sleep habits. Review good sleep hygiene tips.    Initial Review & Psychosocial Screening:  Initial Psych Review & Screening - 02/05/23 1028       Initial Review   Current issues with Current Anxiety/Panic      Family Dynamics   Good Support System? Yes    Comments Raiford Noble has intra family strains and can get depressed over his daughter that is off of drugs now but doesnt get to see his grandkids. He has his wife, daughter and good friend to look to for support.      Barriers   Psychosocial barriers to participate in program The patient should benefit from training in stress management and relaxation.;There are no identifiable barriers or psychosocial needs.      Screening Interventions   Interventions To provide support and resources with identified psychosocial needs;Provide feedback about the scores to participant;Encouraged to exercise    Expected Outcomes Short Term goal: Utilizing psychosocial counselor, staff and physician to assist with identification of specific Stressors or current issues interfering with healing process. Setting desired goal for each stressor or current issue identified.;Long Term Goal: Stressors or current issues are controlled or eliminated.;Short Term goal: Identification and review with participant of any Quality of Life or Depression concerns found by scoring the questionnaire.;Long Term goal: The participant improves quality of Life and PHQ9 Scores as seen by post scores and/or verbalization of changes             Quality of Life Scores:   Quality of Life - 02/14/23 1359       Quality of Life  Select Quality of Life      Quality of Life Scores   Health/Function Pre 15.5 %    Socioeconomic Pre 24.29 %    Psych/Spiritual Pre 24.64 %    Family Pre 25.2 %    GLOBAL Pre 20.62 %            Scores of 19 and below usually indicate a poorer quality of life in these areas.   A difference of  2-3 points is a clinically meaningful difference.  A difference of 2-3 points in the total score of the Quality of Life Index has been associated with significant improvement in overall quality of life, self-image, physical symptoms, and general health in studies assessing change in quality of life.  PHQ-9: Review Flowsheet       02/14/2023 07/02/2017  Depression screen PHQ 2/9  Decreased Interest 1 0  Down, Depressed, Hopeless 0 0  PHQ - 2 Score 1 0  Altered sleeping 1 -  Tired, decreased energy 1 -  Change in appetite 0 -  Feeling bad or failure about yourself  1 -  Trouble concentrating 0 -  Moving slowly or fidgety/restless 0 -  Suicidal thoughts 0 -  PHQ-9 Score 4 -  Difficult doing work/chores Somewhat difficult -    Details           Interpretation of Total Score  Total Score Depression Severity:  1-4 = Minimal depression, 5-9 = Mild depression, 10-14 = Moderate depression, 15-19 = Moderately severe depression, 20-27 = Severe depression   Psychosocial Evaluation and Intervention:  Psychosocial Evaluation - 02/05/23 1032       Psychosocial Evaluation & Interventions   Interventions Encouraged to exercise with the program and follow exercise prescription;Stress management education;Relaxation education    Comments Raiford Noble has intra family strains and can get depressed over his daughter that is off of drugs now but doesnt get to see his grandkids. He has his wife, daughter and good friend to look to for support.    Expected Outcomes Short: Start [facility] to help with mood. Long: Maintain a healthy mental state    Continue Psychosocial Services  Follow up required by staff             Psychosocial Re-Evaluation:  Psychosocial Re-Evaluation     Row Name 03/26/23 1130             Psychosocial Re-Evaluation   Current issues with Current Stress Concerns;Current Sleep Concerns       Comments Raiford Noble is enjoying coming to cardiac rehab, however it does  cause some anxiety the nights before making it hard to sleep. He knows he is getitng older and is scared something will happen while he is exercising. He hasn't had chest pain like he did prior to his MI, but sometimes he gets these episodes of chest discomfort. He plans to discuss these concerns with his doctor. He felt reassured when staff went over his progress in the short time he has been here and he feels motivated to continue coming to see more progress. His wife runs an after school care program, so he doesn't do too much when he is by himself. He enjoys watching football, but feels like he doesn't do too much outside of that. He wants to find something more active to do so he is glad he is involved in the program. He wants to meet with his doctor to discuss his health concerns and is making a list of questions for him.  Expected Outcomes Short: attend cardiac rehab for education and exercise. Long; find and maintain positive self care hobbies       Continue Psychosocial Services  Follow up required by staff                Psychosocial Discharge (Final Psychosocial Re-Evaluation):  Psychosocial Re-Evaluation - 03/26/23 1130       Psychosocial Re-Evaluation   Current issues with Current Stress Concerns;Current Sleep Concerns    Comments Raiford Noble is enjoying coming to cardiac rehab, however it does cause some anxiety the nights before making it hard to sleep. He knows he is getitng older and is scared something will happen while he is exercising. He hasn't had chest pain like he did prior to his MI, but sometimes he gets these episodes of chest discomfort. He plans to discuss these concerns with his doctor. He felt reassured when staff went over his progress in the short time he has been here and he feels motivated to continue coming to see more progress. His wife runs an after school care program, so he doesn't do too much when he is by himself. He enjoys watching football, but feels like he  doesn't do too much outside of that. He wants to find something more active to do so he is glad he is involved in the program. He wants to meet with his doctor to discuss his health concerns and is making a list of questions for him.    Expected Outcomes Short: attend cardiac rehab for education and exercise. Long; find and maintain positive self care hobbies    Continue Psychosocial Services  Follow up required by staff             Vocational Rehabilitation: Provide vocational rehab assistance to qualifying candidates.   Vocational Rehab Evaluation & Intervention:  Vocational Rehab - 03/26/23 1114       Initial Vocational Rehab Evaluation & Intervention   Assessment shows need for Vocational Rehabilitation No             Education: Education Goals: Education classes will be provided on a variety of topics geared toward better understanding of heart health and risk factor modification. Participant will state understanding/return demonstration of topics presented as noted by education test scores.  Learning Barriers/Preferences:  Learning Barriers/Preferences - 02/05/23 1028       Learning Barriers/Preferences   Learning Barriers None    Learning Preferences None             General Cardiac Education Topics:  AED/CPR: - Group verbal and written instruction with the use of models to demonstrate the basic use of the AED with the basic ABC's of resuscitation.   Anatomy and Cardiac Procedures: - Group verbal and visual presentation and models provide information about basic cardiac anatomy and function. Reviews the testing methods done to diagnose heart disease and the outcomes of the test results. Describes the treatment choices: Medical Management, Angioplasty, or Coronary Bypass Surgery for treating various heart conditions including Myocardial Infarction, Angina, Valve Disease, and Cardiac Arrhythmias.  Written material given at graduation. Flowsheet Row Cardiac  Rehab from 03/21/2023 in [facility] Cardiac and Pulmonary Rehab  Education need identified 02/14/23       Medication Safety: - Group verbal and visual instruction to review commonly prescribed medications for heart and lung disease. Reviews the medication, class of the drug, and side effects. Includes the steps to properly store meds and maintain the prescription regimen.  Written material given at graduation.  Intimacy: - Group verbal instruction through game format to discuss how heart and lung disease can affect sexual intimacy. Written material given at graduation..   Know Your Numbers and Heart Failure: - Group verbal and visual instruction to discuss disease risk factors for cardiac and pulmonary disease and treatment options.  Reviews associated critical values for Overweight/Obesity, Hypertension, Cholesterol, and Diabetes.  Discusses basics of heart failure: signs/symptoms and treatments.  Introduces Heart Failure Zone chart for action plan for heart failure.  Written material given at graduation. Flowsheet Row Cardiac Rehab from 03/21/2023 in [facility] Cardiac and Pulmonary Rehab  Education need identified 02/14/23  Date 02/28/23  Educator SB  Instruction Review Code 1- Verbalizes Understanding       Infection Prevention: - Provides verbal and written material to individual with discussion of infection control including proper hand washing and proper equipment cleaning during exercise session. Flowsheet Row Cardiac Rehab from 03/21/2023 in [facility] Cardiac and Pulmonary Rehab  Date 02/14/23  Educator [facility]  Instruction Review Code 1- Verbalizes Understanding       Falls Prevention: - Provides verbal and written material to individual with discussion of falls prevention and safety. Flowsheet Row Cardiac Rehab from 03/21/2023 in [facility] Cardiac and Pulmonary Rehab  Date 02/14/23  Educator [facility]  Instruction Review Code 1- Verbalizes Understanding       Other: -Provides group and verbal  instruction on various topics (see comments)   Knowledge Questionnaire Score:  Knowledge Questionnaire Score - 02/14/23 1358       Knowledge Questionnaire Score   Pre Score 21/26             Core Components/Risk Factors/Patient Goals at Admission:  Personal Goals and Risk Factors at Admission - 02/14/23 1359       Core Components/Risk Factors/Patient Goals on Admission    Weight Management Yes;Weight Loss    Intervention Weight Management: Develop a combined nutrition and exercise program designed to reach desired caloric intake, while maintaining appropriate intake of nutrient and fiber, sodium and fats, and appropriate energy expenditure required for the weight goal.;Weight Management: Provide education and appropriate resources to help participant work on and attain dietary goals.;Weight Management/Obesity: Establish reasonable short term and long term weight goals.;Obesity: Provide education and appropriate resources to help participant work on and attain dietary goals.    Admit Weight 249 lb 12.8 oz (113.3 kg)    Goal Weight: Short Term 240 lb (108.9 kg)    Goal Weight: Long Term 185 lb (83.9 kg)    Expected Outcomes Short Term: Continue to assess and modify interventions until short term weight is achieved;Long Term: Adherence to nutrition and physical activity/exercise program aimed toward attainment of established weight goal;Understanding recommendations for meals to include 15-35% energy as protein, 25-35% energy from fat, 35-60% energy from carbohydrates, less than 200mg  of dietary cholesterol, 20-35 gm of total fiber daily;Understanding of distribution of calorie intake throughout the day with the consumption of 4-5 meals/snacks;Weight Loss: Understanding of general recommendations for a balanced deficit meal plan, which promotes 1-2 lb weight loss per week and includes a negative energy balance of [phone] kcal/d    Heart Failure Yes    Intervention Provide a combined  exercise and nutrition program that is supplemented with education, support and counseling about heart failure. Directed toward relieving symptoms such as shortness of breath, decreased exercise tolerance, and extremity edema.    Expected Outcomes Improve functional capacity of life;Short term: Attendance in program 2-3 days a week with increased exercise capacity. Reported lower  sodium intake. Reported increased fruit and vegetable intake. Reports medication compliance.;Short term: Daily weights obtained and reported for increase. Utilizing diuretic protocols set by physician.;Long term: Adoption of self-care skills and reduction of barriers for early signs and symptoms recognition and intervention leading to self-care maintenance.    Hypertension Yes    Intervention Provide education on lifestyle modifcations including regular physical activity/exercise, weight management, moderate sodium restriction and increased consumption of fresh fruit, vegetables, and low fat dairy, alcohol moderation, and smoking cessation.;Monitor prescription use compliance.    Expected Outcomes Short Term: Continued assessment and intervention until BP is < 140/66mm HG in hypertensive participants. < 130/28mm HG in hypertensive participants with diabetes, heart failure or chronic kidney disease.;Long Term: Maintenance of blood pressure at goal levels.    Lipids Yes    Intervention Provide education and support for participant on nutrition & aerobic/resistive exercise along with prescribed medications to achieve LDL 70mg , HDL >40mg .    Expected Outcomes Short Term: Participant states understanding of desired cholesterol values and is compliant with medications prescribed. Participant is following exercise prescription and nutrition guidelines.;Long Term: Cholesterol controlled with medications as prescribed, with individualized exercise RX and with personalized nutrition plan. Value goals: LDL < 70mg , HDL > 40 mg.              Education:Diabetes - Individual verbal and written instruction to review signs/symptoms of diabetes, desired ranges of glucose level fasting, after meals and with exercise. Acknowledge that pre and post exercise glucose checks will be done for 3 sessions at entry of program.   Core Components/Risk Factors/Patient Goals Review:   Goals and Risk Factor Review     Row Name 03/26/23 1111             Core Components/Risk Factors/Patient Goals Review   Personal Goals Review Weight Management/Obesity;Heart Failure       Review Raiford Noble has enjoyed the program so far. He has been staying at the same weight for 2 months. One doctor mentioned some fluid in his ankles, so he is making sure he is taking his lasix approrpirately. He sees his cardiologist in a few weeks and plans to ask him questions related to heart health. He had gained weight after taking prednisone as part of his cancer treatment, so he feels like he is trying to fight a losing battle. He has met with the dietician and is trying to implement those things with his wife's help.       Expected Outcomes Short: attend cardiac rehab for education and exercise. Long: Independently manage risk factors                Core Components/Risk Factors/Patient Goals at Discharge (Final Review):   Goals and Risk Factor Review - 03/26/23 1111       Core Components/Risk Factors/Patient Goals Review   Personal Goals Review Weight Management/Obesity;Heart Failure    Review Raiford Noble has enjoyed the program so far. He has been staying at the same weight for 2 months. One doctor mentioned some fluid in his ankles, so he is making sure he is taking his lasix approrpirately. He sees his cardiologist in a few weeks and plans to ask him questions related to heart health. He had gained weight after taking prednisone as part of his cancer treatment, so he feels like he is trying to fight a losing battle. He has met with the dietician and is trying to implement  those things with his wife's help.    Expected Outcomes Short: attend  cardiac rehab for education and exercise. Long: Independently manage risk factors             ITP Comments:  ITP Comments     Row Name 02/05/23 1026 02/14/23 1348 02/19/23 1146 02/28/23 1223 03/28/23 1314   ITP Comments Virtual Visit completed. Patient informed on EP and RD appointment and 6 Minute walk test. Patient also informed of patient health questionnaires on My Chart. Patient Verbalizes understanding. Visit diagnosis can be found in [facility] 12/27/2022. Completed and gym orientation. Initial ITP created and sent for review to Dr. Bethann Punches, Medical Director. First full day of exercise!  Patient was oriented to gym and equipment including functions, settings, policies, and procedures.  Patient's individual exercise prescription and treatment plan were reviewed.  All starting workloads were established based on the results of the 6 minute walk test done at initial orientation visit.  The plan for exercise progression was also introduced and progression will be customized based on patient's performance and goals. 30 Day review completed. Medical Director ITP review done, changes made as directed, and signed approval by Medical Director.    new to program 30 Day review completed. Medical Director ITP review done, changes made as directed, and signed approval by Medical Director.            Comments:

## 2023-03-28 NOTE — Progress Notes (Signed)
Hematology/Oncology Progress note [phone] [phone]     Reason for Visit:  Follow up for lung cancer    ASSESSMENT & PLAN:   Cancer Staging  Adenocarcinoma of left lung, stage 3 (HCC) Staging form: Lung, AJCC 8th Edition - Clinical stage from 02/14/2017: Stage IIIB (cT2b, cN3, cM0) - Signed by Rickard Patience, MD on 02/14/2017   Adenocarcinoma of left lung, stage 3 (HCC) #Stage IIIB lung adenocarcinoma, s/p concurrent chemo/RT and immunotherapy, finished all treatment in January 2020  CT chest abdomen pelvis without contrast reviewed. No recurrence.   Hypothyroidism due to medication Continue Synthroid. Follow up with endocrinologist.   Adrenal insufficiency (HCC) Continue prednisone Follow up with endocrinology   MGUS (monoclonal gammopathy of unknown significance) Today's lab result is pending  Observation.   History of prostate cancer # TURP procedure done on 02/14/2019.  Pathology showed prostate adenocarcinoma, Gleason score 3+3, tumor quantitation, estimated percentage of prostate tissue involved by tumor less than 5%.No perineural invasion identified. Recommend patient to follows up with urologist   IDA (iron deficiency anemia) Lab Results  Component Value Date   HGB 9.3 (L) 03/28/2023   TIBC 445 03/28/2023   IRONPCTSAT 6 (L) 03/28/2023   FERRITIN 11 (L) 03/28/2023    Recommend IV venofer weekly x4.  I discussed about option IV Venofer I discussed about the potential risks including but not limited to allergic reactions/infusion reactions including anaphylactic reactions, phlebitis, high blood pressure, wheezing, SOB, skin rash, weight gain,dark urine, leg swelling, back pain, headache, nausea and fatigue, etc. Patient agrees with IV Venofer.  Plan IV venofer weekly x 4   Etiology of IDA is unknown. Recommend GI work up-possible GI blood loss due to dual antiplatelet therapy.  Orders Placed This Encounter  Procedures   Ferritin    Standing  Status:   Future    Number of Occurrences:   1    Standing Expiration Date:   03/27/2024   Iron and TIBC    Standing Status:   Future    Number of Occurrences:   1    Standing Expiration Date:   03/27/2024   Retic Panel    Standing Status:   Future    Number of Occurrences:   1    Standing Expiration Date:   03/27/2024   IFE+PROTEIN ELECTRO, 24-HR UR    Standing Status:   Future    Standing Expiration Date:   03/27/2024   Follow up in 4 months.   All questions were answered. The patient knows to call the clinic with any problems, questions or concerns.  Rickard Patience, MD, PhD [facility] Hematology Oncology 03/28/2023    HISTORY OF PRESENTING ILLNESS:  Marc Gray 77 y.o.  male who presents for treatment of Stage IIIB (cT2b, cN3, cM0)  Adenocarcinoma of lung.  # Patient is s/p [place] and taxol concurrent RT for treatment of Stage IIIB Lung cancer. He recieved additional lung boost RT finished one year of Durvalumab -January 2020  # Colon CA was listed in his history, however reviewing his surgical pathology from colonoscopy on 04/15/2014, during which he had polyps removed and all are negative for cancer.patient is scheduled for colonoscopy screening this year.  # 5/1/ 2019 CT scan was discussed at tumor board and changes consistent with radiation changes  # TURP procedure done on 02/14/2019.  Pathology showed prostate adenocarcinoma, Gleason score 3+3, tumor quantitation, estimated percentage of prostate tissue involved by tumor less than 5%. No perineural invasion identified. Patient follows  up with urologist and was recommended to observe.  03/07/2021, CT without contrast showed unchanged posttreatment/postradiation fibrosis and consolidation of the perihilar left lung and a paramedian left upper lobe.  Unchanged 4 mm nodules of the right lower lobe.  Almost certainly benign and incidental.  Emphysema.  Coronary artery disease.  Medi port was removed.   INTERVAL  HISTORY Patient he presents for follow-up of fo stage IIIB adenocarcinoma of the lung. Patient follows up with endocrinology for adrenal insufficiency and hypothyroidism.   Patient  takes prednisone 10 mg in a.m., 2.5 mg in p.m.  August 2024, hospitalization due to NSTEMI.  Patient is not currently on aspirin and Effient  Review of Systems  Constitutional:  Negative for appetite change, chills, fatigue, fever and unexpected weight change.  HENT:   Negative for hearing loss and voice change.   Eyes:  Negative for eye problems and icterus.  Respiratory:  Negative for chest tightness, cough and shortness of breath.   Cardiovascular:  Negative for chest pain and leg swelling.  Gastrointestinal:  Negative for abdominal distention, abdominal pain and blood in stool.  Endocrine: Negative for hot flashes.  Genitourinary:  Negative for difficulty urinating, dysuria and frequency.   Musculoskeletal:  Positive for arthralgias and back pain.  Skin:  Negative for itching and rash.  Neurological:  Negative for extremity weakness, light-headedness and numbness.  Hematological:  Negative for adenopathy. Does not bruise/bleed easily.  Psychiatric/Behavioral:  Negative for confusion.     MEDICAL HISTORY:  Past Medical History:  Diagnosis Date   Anxiety    Aortic atherosclerosis (HCC)    Arthritis    SHOULDER   BPH (benign prostatic hyperplasia)    Cataract    Chronic kidney disease    low kidney function   Colon adenomas    Colon cancer (HCC) 2015   Pt states during his colonoscopy he had cancerous polyps removed.    COPD (chronic obstructive pulmonary disease) (HCC)    Depression    SINCE DIAGNOSIS   Dyspnea    DOE   Enlarged prostate    GERD (gastroesophageal reflux disease)    Hypertension    Hypothyroidism    Lung cancer (HCC)    Aug 2018   Pain    DUE TO LUNG ISSUE    SURGICAL HISTORY: Past Surgical History:  Procedure Laterality Date   BACK SURGERY     kyphoplasty  T5-6    CATARACT EXTRACTION W/ INTRAOCULAR LENS  IMPLANT, BILATERAL     COLONOSCOPY     COLONOSCOPY WITH PROPOFOL N/A 12/17/2019   Procedure: COLONOSCOPY WITH PROPOFOL;  Surgeon: Earline Mayotte, MD;  Location: [facility] ENDOSCOPY;  Service: Endoscopy;  Laterality: N/A;   CORONARY STENT INTERVENTION N/A 12/29/2022   Procedure: CORONARY STENT INTERVENTION;  Surgeon: Marcina Millard, MD;  Location: [facility] INVASIVE CV LAB;  Service: Cardiovascular;  Laterality: N/A;   EYE SURGERY Bilateral    cataract   LEFT HEART CATH AND CORONARY ANGIOGRAPHY N/A 12/29/2022   Procedure: LEFT HEART CATH AND CORONARY ANGIOGRAPHY;  Surgeon: Marcina Millard, MD;  Location: [facility] INVASIVE CV LAB;  Service: Cardiovascular;  Laterality: N/A;   LYMPH GLAND EXCISION N/A 02/09/2017   Procedure: CERVICAL LYMPH NODE BIOPSY;  Surgeon: Earline Mayotte, MD;  Location: [facility] ORS;  Service: General;  Laterality: N/A;   PORTACATH PLACEMENT Right 02/23/2017   Procedure: INSERTION PORT-A-CATH;  Surgeon: Earline Mayotte, MD;  Location: [facility] ORS;  Service: General;  Laterality: Right;   PROSTATE SURGERY  TONSILLECTOMY     TRANSURETHRAL RESECTION OF PROSTATE N/A 02/14/2019   Procedure: TRANSURETHRAL RESECTION OF THE PROSTATE (TURP);  Surgeon: Sondra Come, MD;  Location: [facility] ORS;  Service: Urology;  Laterality: N/A;    SOCIAL HISTORY: Social History   Socioeconomic History   Marital status: Married    Spouse name: Not on file   Number of children: Not on file   Years of education: Not on file   Highest education level: Not on file  Occupational History   Not on file  Tobacco Use   Smoking status: Former    Current packs/day: 0.00    Average packs/day: 1 pack/day for 53.0 years (53.0 ttl pk-yrs)    Types: Cigarettes    Start date: 04/14/1964    Quit date: 04/14/2017    Years since quitting: 5.9    Passive exposure: Past   Smokeless tobacco: Never   Tobacco comments:    Quit November 2018  Vaping Use   Vaping  status: Never Used  Substance and Sexual Activity   Alcohol use: Not Currently    Comment: occassional   Drug use: No   Sexual activity: Yes  Other Topics Concern   Not on file  Social History Narrative   Not on file   Social Determinants of Health   Financial Resource Strain: Patient Declined (07/27/2022)   Received from [facility], [facility]   Overall Financial Resource Strain (CARDIA)    Difficulty of Paying Living Expenses: Patient declined  Food Insecurity: Patient Declined (07/27/2022)   Received from [facility], [facility]   Hunger Vital Sign    Worried About Running Out of Food in the Last Year: Patient declined    Ran Out of Food in the Last Year: Patient declined  Transportation Needs: Patient Declined (07/27/2022)   Received from [facility], [facility]   PRAPARE - Transportation    In the past 12 months, has lack of transportation kept you from medical appointments or from getting medications?: Patient declined    Lack of Transportation (Non-Medical): Patient declined  Physical Activity: Not on file  Stress: Not on file  Social Connections: Not on file  Intimate Partner Violence: Not on file    FAMILY HISTORY: Family History  Problem Relation Age of Onset   Breast cancer Maternal Grandmother    Dementia Mother    Diabetes Father    Stroke Father     ALLERGIES:  is allergic to morphine and codeine.  MEDICATIONS:  Current Outpatient Medications  Medication Sig Dispense Refill   acetaminophen (TYLENOL) 500 MG tablet Take 1,000 mg by mouth every 6 (six) hours as needed for moderate pain.     aspirin EC 81 MG tablet Take 1 tablet (81 mg total) by mouth daily. Swallow whole. 90 tablet 0   Azelastine HCl 137 MCG/SPRAY SOLN SMARTSIG:1-2 Spray(s) Both Nares Twice Daily     BREZTRI AEROSPHERE 160-9-4.8 MCG/ACT AERO Inhale 2 puffs into the lungs 2 (two)  times daily.     calcium-vitamin D (OSCAL WITH D) 500-200 MG-UNIT tablet Take 1 tablet by mouth daily. 90 tablet 1   diphenhydrAMINE-zinc acetate (BENADRYL EXTRA STRENGTH) cream Apply 1 application topically 3 (three) times daily as needed for itching. 28 g 0   DUPIXENT 300 MG/2ML SOPN Inject 300 mg into the skin every 14 (fourteen) days.     EPINEPHrine 0.3 mg/0.3 mL IJ SOAJ injection Inject 0.3 mg into  the muscle as needed.     fluticasone (FLONASE) 50 MCG/ACT nasal spray Place 1 spray into both nostrils daily. 16 g 2   furosemide (LASIX) 20 MG tablet Take by mouth.     Ipratropium-Albuterol (COMBIVENT) 20-100 MCG/ACT AERS respimat Inhale into the lungs.     levothyroxine (SYNTHROID) 137 MCG tablet Take by mouth.     levothyroxine (SYNTHROID) 150 MCG tablet Take 1 tablet by mouth daily.     Melatonin 10 MG TABS Take 10 mg by mouth at bedtime.      metoprolol tartrate (LOPRESSOR) 25 MG tablet Take 0.5 tablets (12.5 mg total) by mouth 2 (two) times daily. 30 tablet 2   montelukast (SINGULAIR) 10 MG tablet Take 10 mg by mouth daily.     Multiple Vitamin (MULTI-VITAMINS) TABS Take 1 tablet by mouth daily.      pantoprazole (PROTONIX) 40 MG tablet Take 1 tablet (40 mg total) by mouth daily. 90 tablet 0   polyethylene glycol-electrolytes (NULYTELY) 420 g solution MIX AND DRINK AS DIRECTED     prasugrel (EFFIENT) 10 MG TABS tablet Take 1 tablet (10 mg total) by mouth daily. 30 tablet 11   predniSONE (DELTASONE) 5 MG tablet Take 5 mg by mouth daily.     prochlorperazine (COMPAZINE) 10 MG tablet Take 1 tablet (10 mg total) by mouth every 6 (six) hours as needed for nausea or vomiting. 60 tablet 2   rosuvastatin (CRESTOR) 10 MG tablet      SHINGRIX injection      SPIKEVAX syringe      tadalafil (CIALIS) 5 MG tablet Take 5-10mg  by mouth as needed prior to intercourse 30 tablet 11   tamsulosin (FLOMAX) 0.4 MG CAPS capsule Take 1 capsule (0.4 mg total) by mouth daily. 90 capsule 3   traZODone (DESYREL)  100 MG tablet Take 100 mg by mouth at bedtime.     triamcinolone ointment (KENALOG) 0.5 % Apply 1 application topically 3 (three) times daily as needed. 30 g 1   No current facility-administered medications for this visit.   Facility-Administered Medications Ordered in Other Visits  Medication Dose Route Frequency Provider Last Rate Last Admin   sodium chloride flush (NS) 0.9 % injection 10 mL  10 mL Intravenous PRN Rickard Patience, MD   10 mL at 04/03/17 2841      .  PHYSICAL EXAMINATION: ECOG PERFORMANCE STATUS: 1 - Symptomatic but completely ambulatory Vitals:   03/28/23 1007  BP: 113/65  Pulse: 66  Resp: 18  Temp: (!) 96.6 F (35.9 C)  SpO2: 100%   Filed Weights   03/28/23 1007  Weight: 258 lb 14.4 oz (117.4 kg)  Physical Exam Constitutional:      General: He is not in acute distress.    Appearance: He is obese. He is not diaphoretic.  HENT:     Head: Normocephalic and atraumatic.  Eyes:     General: No scleral icterus.    Pupils: Pupils are equal, round, and reactive to light.  Cardiovascular:     Rate and Rhythm: Normal rate and regular rhythm.     Heart sounds: No murmur heard. Pulmonary:     Effort: Pulmonary effort is normal. No respiratory distress.     Breath sounds: No rales.  Chest:     Chest wall: No tenderness.  Abdominal:     General: There is no distension.     Palpations: Abdomen is soft.     Tenderness: There is no abdominal tenderness.  Musculoskeletal:  General: Normal range of motion.     Cervical back: Normal range of motion and neck supple.  Skin:    General: Skin is warm and dry.  Neurological:     Mental Status: He is alert and oriented to person, place, and time. Mental status is at baseline.     Cranial Nerves: No cranial nerve deficit.     Motor: No abnormal muscle tone.  Psychiatric:        Mood and Affect: Mood and affect normal.    LABORATORY DATA:  I have reviewed the data as listed    Latest Ref Rng & Units 03/28/2023     9:58 AM 12/30/2022    5:52 AM 12/29/2022    5:13 AM  CBC  WBC 4.0 - 10.5 K/uL 5.6  7.0  5.9   Hemoglobin 13.0 - 17.0 g/dL 9.3  16.1  09.6   Hematocrit 39.0 - 52.0 % 31.6  35.7  36.2   Platelets 150 - 400 K/uL 185  192  190       Latest Ref Rng & Units 03/28/2023    9:59 AM 12/30/2022    5:52 AM 12/29/2022    5:13 AM  CMP  Glucose 70 - 99 mg/dL 045  83  91   BUN 8 - 23 mg/dL 34  30  33   Creatinine 0.61 - 1.24 mg/dL 4.09  8.11  9.14   Sodium 135 - 145 mmol/L 139  136  141   Potassium 3.5 - 5.1 mmol/L 3.3  4.0  4.6   Chloride 98 - 111 mmol/L 108  104  109   CO2 22 - 32 mmol/L 24  22  24    Calcium 8.9 - 10.3 mg/dL 8.3  8.8  8.5   Total Protein 6.5 - 8.1 g/dL 6.1     Total Bilirubin 0.3 - 1.2 mg/dL 0.6     Alkaline Phos 38 - 126 U/L 34     AST 15 - 41 U/L 20     ALT 0 - 44 U/L 22       RADIOGRAPHIC STUDIES: I have personally reviewed the radiological images as listed and agreed with the findings in the report. CT CHEST ABDOMEN PELVIS WO CONTRAST  Result Date: 03/27/2023 CLINICAL DATA:  Lung cancer follow-. History of stage Gray adenocarcinoma. * Tracking Code: BO * EXAM: CT CHEST, ABDOMEN AND PELVIS WITHOUT CONTRAST TECHNIQUE: Multidetector CT imaging of the chest, abdomen and pelvis was performed following the standard protocol without IV contrast. RADIATION DOSE REDUCTION: This exam was performed according to the departmental dose-optimization program which includes automated exposure control, adjustment of the mA and/or kV according to patient size and/or use of iterative reconstruction technique. COMPARISON:  CT of the chest, abdomen and pelvis 03/22/2022 and 09/06/2021. FINDINGS: CT CHEST FINDINGS Cardiovascular: Atherosclerosis of the aorta, great vessels and coronary arteries. Central venous catheter has been removed in the interval. The heart size is normal. There is no pericardial effusion. Mediastinum/Nodes: There are no enlarged mediastinal, hilar or axillary lymph nodes. Hilar  assessment is limited by the lack of intravenous contrast, although the hilar contours appear unchanged. Thyroid atrophy versus previous thyroidectomy. The esophagus appears unremarkable. Lungs/Pleura: No pleural effusion or pneumothorax. Stable mild to moderate centrilobular and paraseptal emphysema. Stable radiation changes in the left perihilar region with volume loss and bandlike fibrosis. Stable benign 4 mm right lower lobe nodule on image 87/4. No new, enlarging or suspicious nodules. Musculoskeletal/Chest wall: No chest wall mass or suspicious  osseous findings. Stable sequela of previous spinal augmentation at T5 and T7. Stable mild superior endplate compression deformity at T2. CT ABDOMEN AND PELVIS FINDINGS Hepatobiliary: The liver appears stable, without suspicious findings on noncontrast imaging. Stable small low-density lesion near the falciform ligament (image 50/2), consistent with a benign finding. No evidence of gallstones, gallbladder wall thickening or biliary dilatation. Pancreas: Unremarkable. No pancreatic ductal dilatation or surrounding inflammatory changes. Spleen: Normal in size without focal abnormality. Adrenals/Urinary Tract: Both adrenal glands appear normal. Stable chronic severe left renal atrophy with cortical thinning. The right kidney appears unremarkable. No evidence of urinary tract calculus or hydronephrosis. The bladder appears normal for its degree of distention. Stomach/Bowel: No enteric contrast administered. The stomach appears unremarkable for its degree of distension. No evidence of bowel wall thickening, distention or surrounding inflammatory change. The appendix appears normal. Vascular/Lymphatic: There are no enlarged abdominal or pelvic lymph nodes. Aortic and branch vessel atherosclerosis without evidence of aneurysm. Reproductive: Stable postsurgical changes in the prostate gland consistent with previous trans urethral resection. Other: Stable small umbilical hernia  containing only fat. No ascites or peritoneal nodularity. Musculoskeletal: No acute or significant osseous findings. Similar lumbar spondylosis and convex left scoliosis. Unless specific follow-up recommendations are mentioned in the findings or impression sections, no imaging follow-up of any mentioned incidental findings is recommended. IMPRESSION: 1. Stable CT of the chest, abdomen and pelvis. No evidence of local recurrence or metastatic disease. 2. Stable radiation changes in the left perihilar region. 3. Aortic Atherosclerosis (ICD10-I70.0) and Emphysema (ICD10-J43.9). Electronically Signed   By: Carey Bullocks M.D.   On: 03/27/2023 17:21   MR LUMBAR SPINE WO CONTRAST  Result Date: 03/21/2023 CLINICAL DATA:  Lumbar radiculopathy, symptoms persist with greater than 6 weeks of treatment. Low back pain. EXAM: MRI LUMBAR SPINE WITHOUT CONTRAST TECHNIQUE: Multiplanar, multisequence MR imaging of the lumbar spine was performed. No intravenous contrast was administered. COMPARISON:  The agger fee 02/20/2023.  MRI 04/10/2021. FINDINGS: Segmentation: 5 lumbar type vertebral bodies as numbered previously. Alignment: Straightening of the normal lumbar lordosis. 2 mm degenerative anterolisthesis at L3-4. 3 mm degenerative retrolisthesis L5-S1. Unchanged since 2022. Vertebrae: No fracture or focal bone lesion. Degenerative endplate changes at L4-5 and L5-S1 without significant active edema. Conus medullaris and cauda equina: Conus extends to the L1 level. Conus and cauda equina appear normal. Paraspinal and other soft tissues: Bladder appears quite full. Disc levels: T12-L1: Normal L1-2: Mild bulging of the disc.  No stenosis or neural compression. L2-3: Mild bulging of the disc. Bilateral facet osteoarthritis. No compressive stenosis. L3-4: Bilateral facet osteoarthritis with 2 mm of anterolisthesis. Mild bulging of the disc. No compressive stenosis. L4-5: Endplate osteophytes and broad-based disc protrusion more  prominent in the left posterolateral direction, with caudal down turning in the left lateral recess. This has desiccated slightly since the prior study. There is stenosis of the left lateral recess that could compress the left L5 nerve. Mild encroachment upon the right lateral foramen by osteophyte in disc material is similar to the prior study, without definite compression of the exiting right L4 nerve. L5-S1: Retrolisthesis of 3 mm. Chronic disc degeneration with loss of disc height. Endplate osteophytes and bulging of the disc more prominent in the left posterolateral direction. Mild left foraminal narrowing but no definite compression of the exiting L5 nerve. IMPRESSION: 1. L4-5: Endplate osteophytes and broad-based disc protrusion more prominent in the left posterolateral direction, with caudal down turning in the left lateral recess. This has desiccated slightly since the  prior study. There is stenosis of the left lateral recess that could compress the left L5 nerve. Mild encroachment upon the right lateral foramen by osteophyte and disc material is similar to the prior study, without definite compression of the exiting right L4 nerve. 2. L5-S1: Chronic disc degeneration with loss of disc height. Endplate osteophytes and bulging of the disc more prominent in the left posterolateral direction. Mild left foraminal narrowing but no definite compression of the exiting L5 nerve in this location. 3. L3-4: Bilateral facet osteoarthritis with 2 mm of anterolisthesis. Mild bulging of the disc. No compressive stenosis. 4. L2-3: Mild bulging of the disc. Bilateral facet osteoarthritis. No compressive stenosis. Electronically Signed   By: Paulina Fusi M.D.   On: 03/21/2023 10:16   MR CERVICAL SPINE WO CONTRAST  Result Date: 03/21/2023 CLINICAL DATA:  Myelopathy, acute, cervical spine. Stiff neck over the last 2 years. EXAM: MRI CERVICAL SPINE WITHOUT CONTRAST TECHNIQUE: Multiplanar, multisequence MR imaging of the  cervical spine was performed. No intravenous contrast was administered. COMPARISON:  None Available. FINDINGS: Alignment: No malalignment. Vertebrae: Old healed superior endplate fracture at T2 with loss of height of 25% anteriorly. Cord: No cord compression or focal cord lesion. Posterior Fossa, vertebral arteries, paraspinal tissues: Negative Disc levels: The foramen magnum is widely patent. There is ordinary mild osteoarthritis of the C1-2 articulation but no encroachment upon the neural structures. C2-3: Normal interspace. C3-4: Minimal disc bulge.  Mild facet osteoarthritis.  No stenosis. C4-5: Endplate osteophytes and mild bulging of the disc. Mild left foraminal narrowing, probably not compressive. C5-6: Endplate osteophytes and bulging of the disc. Uncovertebral osteophytes cause foraminal encroachment right more than left. Some potential the right C6 nerve could be affected. C6-7: Endplate osteophytes and bulging of the disc. Narrowing of the ventral subarachnoid space but no compressive effect upon the cord. Mild foraminal narrowing on the left. C7-T1: Normal IMPRESSION: 1. No cord compression or focal cord lesion. 2. C5-6: Endplate osteophytes and bulging of the disc. Uncovertebral osteophytes cause foraminal encroachment right more than left. Some potential the right C6 nerve could be affected. 3. C6-7: Endplate osteophytes and bulging of the disc. Narrowing of the ventral subarachnoid space but no compressive effect upon the cord. Mild foraminal narrowing on the left. 4. C4-5: Endplate osteophytes and bulging of the disc. Mild left foraminal narrowing, probably not compressive. 5. Old healed superior endplate fracture at T2 with loss of height of 25% anteriorly. 6. Findings in general could be associated with neck stiffness, as described clinically. Electronically Signed   By: Paulina Fusi M.D.   On: 03/21/2023 10:08   DG Lumbar Spine Complete  Result Date: 03/11/2023 CLINICAL DATA:  Pain. EXAM:  LUMBAR SPINE - COMPLETE 5 VIEW COMPARISON:  None Available. FINDINGS: No compression deformities. Osseous structures are osteopenic. There is grade 1 L4 retrolisthesis. L4-5 and L5-S1 disc space narrowing and marginal osteophytes consistent with degenerative disc disease. No motion with flexion and extension. There is dense calcification of the aorta and iliacs. There is suggestion of distal abdominal aortic aneurysm which can be assessed further with CT or ultrasound. IMPRESSION: Osteopenia. Degenerative changes. Distal abdominal aortic aneurysm that can be evaluated more definitively with ultrasound or CT. Electronically Signed   By: Layla Maw M.D.   On: 03/11/2023 19:11   DG Bone Density  Result Date: 02/27/2023 EXAM: DUAL X-RAY ABSORPTIOMETRY (DXA) FOR BONE MINERAL DENSITY IMPRESSION: Your patient Marc Gray completed a BMD test on 02/27/2023 using the [facility] Advance DXA System (  analysis version: 14.10) manufactured by [employer]. The following summarizes the results of our evaluation. Technologist: SCE PATIENT BIOGRAPHICAL: Name: Marc Gray, Marc Gray Patient ID: [id number] Birth Date: 03-05-1946 Height: 72.0 in. Gender: Male Exam Date: 02/27/2023 Weight: 253.4 lbs. Indications: Advanced Age, Caucasian, copd, Family Hist. (Parent hip fracture), Family History of Fracture, Glucocorticoids (Chronic), Height Loss, History of Fracture (Adult), hx lung ca, hypothyroid, Kidney Disease Fractures: Spine Treatments: Breztri, calcium /vit d, Combivent, levothyroxine, multivitamin, Prednisone, Protonix, Singulair ASSESSMENT: The BMD measured at Femur Total Right is 0.958 g/cm2 with a T-score of -1.0. This patient is considered osteopenic according to World Health Organization [facility]) criteria. The scan quality is good. Lumbar spine was not utilized due to advanced degenerative changes. Site Region Measured Measured WHO Young Adult BMD Date       Age      Classification T-score DualFemur Total  Right 3/27/2023 76.0 Normal -1.0 0.958 g/cm2 DualFemur Total Right 01/05/2020 72.9 Normal -1.0 0.956 g/cm2 DualFemur Total Right 12/31/2017 70.9 Normal -0.7 0.996 g/cm2 DualFemur Total Mean 02/27/2023 76.0 Normal -0.9 0.972 g/cm2 DualFemur Total Mean 01/05/2020 72.9 Normal -0.9 0.965 g/cm2 DualFemur Total Mean 12/31/2017 70.9 Normal -0.7 1.003 g/cm2 Left Forearm Radius 33% 02/27/2023 76.0 Normal -0.5 0.938 g/cm2 Left Forearm Radius 33% 12/31/2017 70.9 Normal -0.5 0.939 g/cm2 World Health Organization [facility]) criteria for post-menopausal, Caucasian Women: Normal:       T-score at or above -1 SD Osteopenia:   T-score between -1 and -2.5 SD Osteoporosis: T-score at or below -2.5 SD RECOMMENDATIONS: 1. All patients should optimize calcium and vitamin D intake. 2. Consider FDA-approved medical therapies in postmenopausal women and men aged 13 years and older, based on the following: a. A hip or vertebral (clinical or morphometric) fracture b. T-score < -2.5 at the femoral neck or spine after appropriate evaluation to exclude secondary causes c. Low bone mass (T-score between -1.0 and -2.5 at the femoral neck or spine) and a 10-year probability of a hip fracture > 3% or a 10-year probability of a major osteoporosis-related fracture > 20% based on the [place]-adapted WHO algorithm d. Clinician judgment and/or patient preferences may indicate treatment for people with 10-year fracture probabilities above or below these levels FOLLOW-UP: People with diagnosed cases of osteoporosis or osteopenia should be regularly tested for bone mineral density. For patients eligible for Medicare, routine testing is allowed once every 2 years. The testing frequency can be increased to one year for patients who have rapidly progressing disease, or for those who are receiving medical therapy to restore bone mass. I have reviewed this report, and agree with the above findings. [facility] Radiology Electronically Signed   By: Frederico Hamman M.D.    On: 02/27/2023 09:10   DG Chest Port 1 View  Result Date: 12/29/2022 CLINICAL DATA:  Dyspnea.  Cardiac catheterization this morning. EXAM: PORTABLE CHEST 1 VIEW COMPARISON:  Radiograph 12/27/2022. FINDINGS: The patient is rotated. Stable heart size. Small left pleural effusion. Left paratracheal density may be related to rotation or tracking pleural effusion. No pneumothorax or confluent airspace disease. No pulmonary edema. Multilevel vertebroplasty in the thoracic spine. IMPRESSION: 1. Small left pleural effusion. 2. Left paratracheal density may be related to rotation or tracking pleural effusion. This could be further assessed with CT as clinically indicated. Electronically Signed   By: Narda Rutherford M.D.   On: 12/29/2022 15:23   CARDIAC CATHETERIZATION  Result Date: 12/29/2022   Dist RCA lesion is 20% stenosed.   Prox LAD to Mid LAD lesion is  90% stenosed.   A drug-eluting stent was successfully placed using a STENT ONYX FRONTIER [id number].   Post intervention, there is a 0% residual stenosis.   The left ventricular systolic function is normal.   LV end diastolic pressure is normal.   The left ventricular ejection fraction is 55-65% by visual estimate. 1.  One-vessel coronary artery disease with 90% stenosis proximal/mid LAD 2.  Normal left ventricular function 3.  Successful PCI with 3.0 x 18 mm Onyx frontier stent proximal/mid LAD Recommendations 1.  Dual antiplatelet therapy uninterrupted for 1 year 2.  Aggressive risk factor modification 3.  Follow-up Dr. Juliann Pares with 1 to 2 weeks

## 2023-03-28 NOTE — Assessment & Plan Note (Signed)
#   TURP procedure done on 02/14/2019.  Pathology showed prostate adenocarcinoma, Gleason score 3+3, tumor quantitation, estimated percentage of prostate tissue involved by tumor less than 5%.No perineural invasion identified. Recommend patient to follows up with urologist

## 2023-03-29 ENCOUNTER — Telehealth: Payer: Self-pay

## 2023-03-29 DIAGNOSIS — J4540 Moderate persistent asthma, uncomplicated: Secondary | ICD-10-CM | POA: Diagnosis not present

## 2023-03-29 DIAGNOSIS — J8283 Eosinophilic asthma: Secondary | ICD-10-CM | POA: Diagnosis not present

## 2023-03-29 LAB — KAPPA/LAMBDA LIGHT CHAINS
Kappa free light chain: 55.6 mg/L — ABNORMAL HIGH (ref 3.3–19.4)
Kappa, lambda light chain ratio: 1.94 — ABNORMAL HIGH (ref 0.26–1.65)
Lambda free light chains: 28.7 mg/L — ABNORMAL HIGH (ref 5.7–26.3)

## 2023-03-29 NOTE — Telephone Encounter (Signed)
-----   Message from Rickard Patience sent at 03/28/2023  7:52 PM EDT ----- Please also recommend GI work up for iron deficiency anemia. Refer him to [facility] GI thanks.

## 2023-03-29 NOTE — Telephone Encounter (Signed)
Referral to GI faxed to [facility].   Re: IDA   Fax confirmation received.

## 2023-03-29 NOTE — Telephone Encounter (Signed)
Per Dr. Cathie Hoops: Iron panel showed iron deficiency anemia. Recommend IV venofer weekly x 4. Please arrange.  Follow up in 4 months lab prior to MD +/- Venofer.

## 2023-03-29 NOTE — Telephone Encounter (Signed)
Called pt, no answer. Detailed VM left and Mychart message sent. Please schedule:   IV venofer weekly x4 4 months: labs piror to MD/Venofer

## 2023-03-30 ENCOUNTER — Encounter: Payer: HMO | Attending: Cardiology | Admitting: *Deleted

## 2023-03-30 DIAGNOSIS — Z955 Presence of coronary angioplasty implant and graft: Secondary | ICD-10-CM | POA: Diagnosis not present

## 2023-03-30 DIAGNOSIS — I252 Old myocardial infarction: Secondary | ICD-10-CM | POA: Insufficient documentation

## 2023-03-30 DIAGNOSIS — Z48812 Encounter for surgical aftercare following surgery on the circulatory system: Secondary | ICD-10-CM | POA: Diagnosis not present

## 2023-03-30 DIAGNOSIS — I214 Non-ST elevation (NSTEMI) myocardial infarction: Secondary | ICD-10-CM

## 2023-03-30 NOTE — Progress Notes (Signed)
Daily Session Note  Patient Details  Name: Marc Gray MRN: [id number] Date of Birth: 09/29/1945 Referring Provider:   Flowsheet Row Cardiac Rehab from 02/14/2023 in [facility] Cardiac and Pulmonary Rehab  Referring Provider Dr. Marcina Millard       Encounter Date: 03/30/2023  Check In:  Session Check In - 03/30/23 1121       Check-In   Supervising physician immediately available to respond to emergencies See telemetry face sheet for immediately available ER MD    Location [facility]-Cardiac & Pulmonary Rehab    Staff Present Ronette Deter, BS, Exercise Physiologist;Joseph Shelbie Proctor, RN, [place]    Virtual Visit No    Medication changes reported     No    Fall or balance concerns reported    No    Warm-up and Cool-down Performed on first and last piece of equipment    Resistance Training Performed Yes    VAD Patient? No    PAD/SET Patient? No      Pain Assessment   Currently in Pain? No/denies                Social History   Tobacco Use  Smoking Status Former   Current packs/day: 0.00   Average packs/day: 1 pack/day for 53.0 years (53.0 ttl pk-yrs)   Types: Cigarettes   Start date: 04/14/1964   Quit date: 04/14/2017   Years since quitting: 5.9   Passive exposure: Past  Smokeless Tobacco Never  Tobacco Comments   Quit November 2018    Goals Met:  Independence with exercise equipment Exercise tolerated well No report of concerns or symptoms today Strength training completed today  Goals Unmet:  Not Applicable  Comments: Pt able to follow exercise prescription today without complaint.  Will continue to monitor for progression.    Dr. Bethann Punches is Medical Director for [facility] Cardiac Rehabilitation.  Dr. Vida Rigger is Medical Director for [facility] Pulmonary Rehabilitation.

## 2023-04-03 ENCOUNTER — Encounter: Payer: Self-pay | Admitting: Neurosurgery

## 2023-04-03 ENCOUNTER — Ambulatory Visit (INDEPENDENT_AMBULATORY_CARE_PROVIDER_SITE_OTHER): Payer: HMO | Admitting: Neurosurgery

## 2023-04-03 VITALS — BP 128/78 | Ht 73.0 in | Wt 258.0 lb

## 2023-04-03 DIAGNOSIS — R200 Anesthesia of skin: Secondary | ICD-10-CM

## 2023-04-03 DIAGNOSIS — I714 Abdominal aortic aneurysm, without rupture, unspecified: Secondary | ICD-10-CM

## 2023-04-03 DIAGNOSIS — M4316 Spondylolisthesis, lumbar region: Secondary | ICD-10-CM | POA: Diagnosis not present

## 2023-04-03 DIAGNOSIS — R202 Paresthesia of skin: Secondary | ICD-10-CM

## 2023-04-03 LAB — MULTIPLE MYELOMA PANEL, SERUM
Albumin SerPl Elph-Mcnc: 3.3 g/dL (ref 2.9–4.4)
Albumin/Glob SerPl: 1.4 (ref 0.7–1.7)
Alpha 1: 0.2 g/dL (ref 0.0–0.4)
Alpha2 Glob SerPl Elph-Mcnc: 0.7 g/dL (ref 0.4–1.0)
B-Globulin SerPl Elph-Mcnc: 1 g/dL (ref 0.7–1.3)
Gamma Glob SerPl Elph-Mcnc: 0.6 g/dL (ref 0.4–1.8)
Globulin, Total: 2.5 g/dL (ref 2.2–3.9)
IgA: 230 mg/dL (ref 61–437)
IgG (Immunoglobin G), Serum: 684 mg/dL (ref 603–1613)
IgM (Immunoglobulin M), Srm: 55 mg/dL (ref 15–143)
M Protein SerPl Elph-Mcnc: 0.2 g/dL — ABNORMAL HIGH
Total Protein ELP: 5.8 g/dL — ABNORMAL LOW (ref 6.0–8.5)

## 2023-04-03 MED ORDER — TRAMADOL HCL 50 MG PO TABS
50.0000 mg | ORAL_TABLET | Freq: Two times a day (BID) | ORAL | 0 refills | Status: AC | PRN
Start: 2023-04-03 — End: 2023-04-13

## 2023-04-03 NOTE — Progress Notes (Signed)
Referring Physician:  [name], MD [address] [place],  [place] [address]  Primary Physician:  [name], MD  History of Present Illness: 04/03/23 [name] presents today for review of imagine. He continues to have numbness in his hands as well as severe low back pain and heaviness in his legs.  He denies any significant changes since his last visit.  02/20/23 [name] 77 y.o presenting with ongoing back and a new onset of heaviness in his legs since his last visit.  He completed physical therapy at [facility] in November of last year and underwent ESIs with Dr. Yves Dill last year. Today he reports ongoing back pain and a new onset of heaviness in his legs particularly with walking long distances which is making things like grocery shopping more challenging.  He continues to have significant low back pain that is worse with changing positions.  He did have good relief after his injections but only for a couple of days.  In addition to this, he reports having an MI and a stent placed about a month ago and is currently on dual antiplatelet therapy.  He also endorses about 6 months of numbness and tingling into his hands with some gait disturbance and problems with his dexterity.  01/19/2022 History of lung CA, HTN, and CKD 3. On chronic prednisone for his adrenal insufficiency.   [name] is here today with a chief complaint of 2 year history of more constant LBP, left > right, with no leg pain. Pain is worse with activity.  Scribes sharp low back pain with getting up from a seated position.  Relief with sitting and resting.  Denies numbness or tingling that radiates into his legs or associated leg pain.  Worked out with trainer previously. No PT. No weakness in his legs. No bowel/bladder dysfunction.   Conservative measures:  Physical therapy: no  Multimodal medical therapy including regular antiinflammatories: prednisone  (chronic) Injections: 04/28/21 bilateral L5-S1 TF ESI with 50% improvement per notes (no relief per patient).    Past Surgery: no lumbar surgery  [name] has no symptoms of cervical myelopathy.  The symptoms are causing a significant impact on the patient's life.   Review of Systems:  A 10 point review of systems is negative, except for the pertinent positives and negatives detailed in the HPI.  Past Medical History: Past Medical History:  Diagnosis Date   Anxiety    Aortic atherosclerosis (HCC)    Arthritis    SHOULDER   BPH (benign prostatic hyperplasia)    Cataract    Chronic kidney disease    low kidney function   Colon adenomas    Colon cancer (HCC) 2015   Pt states during his colonoscopy he had cancerous polyps removed.    COPD (chronic obstructive pulmonary disease) (HCC)    Depression    SINCE DIAGNOSIS   Dyspnea    DOE   Enlarged prostate    GERD (gastroesophageal reflux disease)    Hypertension    Hypothyroidism    Lung cancer (HCC)    Aug 2018   Pain    DUE TO LUNG ISSUE    Past Surgical History: Past Surgical History:  Procedure Laterality Date   BACK SURGERY     kyphoplasty  T5-6   CATARACT EXTRACTION W/ INTRAOCULAR LENS  IMPLANT, BILATERAL     COLONOSCOPY     COLONOSCOPY WITH PROPOFOL N/A 12/17/2019   Procedure: COLONOSCOPY WITH PROPOFOL;  Surgeon: Earline Mayotte, MD;  Location: [facility] ENDOSCOPY;  Service: Endoscopy;  Laterality: N/A;   CORONARY STENT INTERVENTION N/A 12/29/2022   Procedure: CORONARY STENT INTERVENTION;  Surgeon: Marcina Millard, MD;  Location: [facility] INVASIVE CV LAB;  Service: Cardiovascular;  Laterality: N/A;   EYE SURGERY Bilateral    cataract   LEFT HEART CATH AND CORONARY ANGIOGRAPHY N/A 12/29/2022   Procedure: LEFT HEART CATH AND CORONARY ANGIOGRAPHY;  Surgeon: Marcina Millard, MD;  Location: [facility] INVASIVE CV LAB;  Service: Cardiovascular;  Laterality: N/A;   LYMPH GLAND EXCISION N/A 02/09/2017    Procedure: CERVICAL LYMPH NODE BIOPSY;  Surgeon: Earline Mayotte, MD;  Location: [facility] ORS;  Service: General;  Laterality: N/A;   PORTACATH PLACEMENT Right 02/23/2017   Procedure: INSERTION PORT-A-CATH;  Surgeon: Earline Mayotte, MD;  Location: [facility] ORS;  Service: General;  Laterality: Right;   PROSTATE SURGERY     TONSILLECTOMY     TRANSURETHRAL RESECTION OF PROSTATE N/A 02/14/2019   Procedure: TRANSURETHRAL RESECTION OF THE PROSTATE (TURP);  Surgeon: Sondra Come, MD;  Location: [facility] ORS;  Service: Urology;  Laterality: N/A;    Allergies: Allergies as of 04/03/2023 - Review Complete 03/28/2023  Allergen Reaction Noted   Morphine and codeine  12/16/2019    Medications: Outpatient Encounter Medications as of 04/03/2023  Medication Sig   acetaminophen (TYLENOL) 500 MG tablet Take 1,000 mg by mouth every 6 (six) hours as needed for moderate pain.   aspirin EC 81 MG tablet Take 1 tablet (81 mg total) by mouth daily. Swallow whole.   Azelastine HCl 137 MCG/SPRAY SOLN SMARTSIG:1-2 Spray(s) Both Nares Twice Daily   BREZTRI AEROSPHERE 160-9-4.8 MCG/ACT AERO Inhale 2 puffs into the lungs 2 (two) times daily.   calcium-vitamin D (OSCAL WITH D) 500-200 MG-UNIT tablet Take 1 tablet by mouth daily.   diphenhydrAMINE-zinc acetate (BENADRYL EXTRA STRENGTH) cream Apply 1 application topically 3 (three) times daily as needed for itching.   DUPIXENT 300 MG/2ML SOPN Inject 300 mg into the skin every 14 (fourteen) days.   EPINEPHrine 0.3 mg/0.3 mL IJ SOAJ injection Inject 0.3 mg into the muscle as needed.   fluticasone (FLONASE) 50 MCG/ACT nasal spray Place 1 spray into both nostrils daily.   furosemide (LASIX) 20 MG tablet Take by mouth.   Ipratropium-Albuterol (COMBIVENT) 20-100 MCG/ACT AERS respimat Inhale into the lungs.   levothyroxine (SYNTHROID) 137 MCG tablet Take by mouth.   levothyroxine (SYNTHROID) 150 MCG tablet Take 1 tablet by mouth daily.   Melatonin 10 MG TABS Take 10 mg by mouth  at bedtime.    metoprolol tartrate (LOPRESSOR) 25 MG tablet Take 0.5 tablets (12.5 mg total) by mouth 2 (two) times daily.   montelukast (SINGULAIR) 10 MG tablet Take 10 mg by mouth daily.   Multiple Vitamin (MULTI-VITAMINS) TABS Take 1 tablet by mouth daily.    pantoprazole (PROTONIX) 40 MG tablet Take 1 tablet (40 mg total) by mouth daily.   polyethylene glycol-electrolytes (NULYTELY) 420 g solution MIX AND DRINK AS DIRECTED   prasugrel (EFFIENT) 10 MG TABS tablet Take 1 tablet (10 mg total) by mouth daily.   predniSONE (DELTASONE) 5 MG tablet Take 5 mg by mouth daily.   prochlorperazine (COMPAZINE) 10 MG tablet Take 1 tablet (10 mg total) by mouth every 6 (six) hours as needed for nausea or vomiting.   rosuvastatin (CRESTOR) 10 MG tablet    SHINGRIX injection    SPIKEVAX syringe    tadalafil (CIALIS) 5 MG tablet Take 5-10mg  by mouth  as needed prior to intercourse   tamsulosin (FLOMAX) 0.4 MG CAPS capsule Take 1 capsule (0.4 mg total) by mouth daily.   traZODone (DESYREL) 100 MG tablet Take 100 mg by mouth at bedtime.   triamcinolone ointment (KENALOG) 0.5 % Apply 1 application topically 3 (three) times daily as needed.   Facility-Administered Encounter Medications as of 04/03/2023  Medication   sodium chloride flush (NS) 0.9 % injection 10 mL    Social History: Social History   Tobacco Use   Smoking status: Former    Current packs/day: 0.00    Average packs/day: 1 pack/day for 53.0 years (53.0 ttl pk-yrs)    Types: Cigarettes    Start date: 04/14/1964    Quit date: 04/14/2017    Years since quitting: 5.9    Passive exposure: Past   Smokeless tobacco: Never   Tobacco comments:    Quit November 2018  Vaping Use   Vaping status: Never Used  Substance Use Topics   Alcohol use: Not Currently    Comment: occassional   Drug use: No    Family Medical History: Family History  Problem Relation Age of Onset   Breast cancer Maternal Grandmother    Dementia Mother    Diabetes  Father    Stroke Father     Physical Examination: A&Ox3  NEUROLOGICAL:     Awake, alert, oriented to person, place, and time.  Speech is clear and fluent. Fund of knowledge is appropriate.   Cranial Nerves: Pupils equal round and reactive to light.  Facial tone is symmetric.  Facial sensation is symmetric.  ROM of spine: reasonable ROM with no pain. Mild diffuse lower lumbar tenderness.     Strength: Side Biceps Triceps Deltoid Interossei Grip Wrist Ext. Wrist Flex.  R 5 5 5 5 5 5 5   L 5 5 5 5 5 5 5    Side Iliopsoas Quads Hamstring PF DF EHL  R 5 5 5 5 5 5   L 5 5 5 5 5 5    Reflexes are 3+ and symmetric at the biceps, triceps, brachioradialis, patella and achilles.  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 Clonus is not present.  Toes are down-going.  Decreases sensation throughout BUE and BLE Ambulates with a wide-based, slowed gait with the assistance of a cane.  Medical Decision Making  Imaging: 02/20/23 lumbar xrays FINDINGS: No compression deformities. Osseous structures are osteopenic. There is grade 1 L4 retrolisthesis. L4-5 and L5-S1 disc space narrowing and marginal osteophytes consistent with degenerative disc disease. No motion with flexion and extension.   There is dense calcification of the aorta and iliacs. There is suggestion of distal abdominal aortic aneurysm which can be assessed further with CT or ultrasound.   IMPRESSION: Osteopenia. Degenerative changes. Distal abdominal aortic aneurysm that can be evaluated more definitively with ultrasound or CT.     Electronically Signed   By: Layla Maw M.D.   On: 03/11/2023 19:11   02/27/23 MRI C spine IMPRESSION: 1. No cord compression or focal cord lesion. 2. C5-6: Endplate osteophytes and bulging of the disc. Uncovertebral osteophytes cause foraminal encroachment right more than left. Some potential the right C6 nerve could be affected. 3. C6-7: Endplate osteophytes and bulging of the disc. Narrowing of the  ventral subarachnoid space but no compressive effect upon the cord. Mild foraminal narrowing on the left. 4. C4-5: Endplate osteophytes and bulging of the disc. Mild left foraminal narrowing, probably not compressive. 5. Old healed superior endplate fracture at T2 with loss of height  of 25% anteriorly. 6. Findings in general could be associated with neck stiffness, as described clinically.     Electronically Signed   By: Paulina Fusi M.D.   On: 03/21/2023 10:08  02/27/23 MRI L spine  IMPRESSION: 1. L4-5: Endplate osteophytes and broad-based disc protrusion more prominent in the left posterolateral direction, with caudal down turning in the left lateral recess. This has desiccated slightly since the prior study. There is stenosis of the left lateral recess that could compress the left L5 nerve. Mild encroachment upon the right lateral foramen by osteophyte and disc material is similar to the prior study, without definite compression of the exiting right L4 nerve. 2. L5-S1: Chronic disc degeneration with loss of disc height. Endplate osteophytes and bulging of the disc more prominent in the left posterolateral direction. Mild left foraminal narrowing but no definite compression of the exiting L5 nerve in this location. 3. L3-4: Bilateral facet osteoarthritis with 2 mm of anterolisthesis. Mild bulging of the disc. No compressive stenosis. 4. L2-3: Mild bulging of the disc. Bilateral facet osteoarthritis. No compressive stenosis.     Electronically Signed   By: Paulina Fusi M.D.   On: 03/21/2023 10:16   I have personally reviewed the images and agree with the above interpretation.  Assessment and Plan: [name] is a pleasant 77 y.o. male with a longstanding history of lumbosacral complaints and L3-4 anterolisthesis.  Reviewed his imaging at length.  His cervical MRI does not show any signs of cervical stenosis.  I recommended further evaluation by neurology for his ongoing  numbness and tingling in his hands.  I have also placed a referral to vascular for further evaluation given his incidental abdominal aortic aneurysm on x-ray. We had a lengthy discussion regarding his long-term goals and comorbidities.  I would like to review his imaging and history with Dr. Myer Haff in further detail to discuss the best course of action going forward.  I will contact the patient with the results of this discussion.  He expressed understanding and was in agreement with this plan.  I spent a total of 40 minutes in both face-to-face and non-face-to-face activities for this visit on the date of this encounter extensive review of imaging, discussion of symptoms, physical exam, documentation, and order placement.  Athan Charity PA-C Dept. of Neurosurgery

## 2023-04-04 ENCOUNTER — Encounter: Payer: HMO | Admitting: *Deleted

## 2023-04-04 ENCOUNTER — Other Ambulatory Visit: Payer: Self-pay

## 2023-04-04 DIAGNOSIS — I214 Non-ST elevation (NSTEMI) myocardial infarction: Secondary | ICD-10-CM

## 2023-04-04 DIAGNOSIS — Z955 Presence of coronary angioplasty implant and graft: Secondary | ICD-10-CM | POA: Diagnosis not present

## 2023-04-04 DIAGNOSIS — D472 Monoclonal gammopathy: Secondary | ICD-10-CM

## 2023-04-04 NOTE — Progress Notes (Signed)
Daily Session Note  Patient Details  Name: Marc Gray MRN: [id number] Date of Birth: [birth year] Referring Provider:   Flowsheet Row Cardiac Rehab from 02/14/2023 in [facility] Cardiac and Pulmonary Rehab  Referring Provider Dr. Marcina Millard       Encounter Date: 04/04/2023  Check In:  Session Check In - 04/04/23 1124       Check-In   Supervising physician immediately available to respond to emergencies See telemetry face sheet for immediately available ER MD    Location [facility]-Cardiac & Pulmonary Rehab    Staff Present Ronette Deter, BS, Exercise Physiologist;Joseph Dayton, Guinevere Ferrari, RN, ADN;[employer], , Exercise Physiologist    Virtual Visit No    Medication changes reported     No    Fall or balance concerns reported    No    Warm-up and Cool-down Performed on first and last piece of equipment    Resistance Training Performed Yes    VAD Patient? No    PAD/SET Patient? No      Pain Assessment   Currently in Pain? No/denies                Social History   Tobacco Use  Smoking Status Former   Current packs/day: 0.00   Average packs/day: 1 pack/day for 53.0 years (53.0 ttl pk-yrs)   Types: Cigarettes   Start date: 04/14/1964   Quit date: 04/14/2017   Years since quitting: 5.9   Passive exposure: Past  Smokeless Tobacco Never  Tobacco Comments   Quit November 2018    Goals Met:  Independence with exercise equipment Exercise tolerated well No report of concerns or symptoms today Strength training completed today  Goals Unmet:  Not Applicable  Comments: Pt able to follow exercise prescription today without complaint.  Will continue to monitor for progression.    Dr. Bethann Punches is Medical Director for [facility] Cardiac Rehabilitation.  Dr. Vida Rigger is Medical Director for [facility] Pulmonary Rehabilitation.

## 2023-04-06 ENCOUNTER — Encounter: Payer: HMO | Admitting: *Deleted

## 2023-04-06 DIAGNOSIS — Z955 Presence of coronary angioplasty implant and graft: Secondary | ICD-10-CM | POA: Diagnosis not present

## 2023-04-06 DIAGNOSIS — I214 Non-ST elevation (NSTEMI) myocardial infarction: Secondary | ICD-10-CM

## 2023-04-06 NOTE — Progress Notes (Signed)
Daily Session Note  Patient Details  Name: Marc Gray MRN: [id number] Date of Birth: [birth year] Referring Provider:   Flowsheet Row Cardiac Rehab from 02/14/2023 in [facility] Cardiac and Pulmonary Rehab  Referring Provider Dr. Marcina Millard       Encounter Date: 04/06/2023  Check In:  Session Check In - 04/06/23 1124       Check-In   Supervising physician immediately available to respond to emergencies See telemetry face sheet for immediately available ER MD    Location [facility]-Cardiac & Pulmonary Rehab    Staff Present Cora Collum, RN, BSN, CCRP;Joseph Hood, RCP,RRT,BSRT;Noah Tickle, [place], Exercise Physiologist    Virtual Visit No    Medication changes reported     No    Fall or balance concerns reported    No    Warm-up and Cool-down Performed on first and last piece of equipment    Resistance Training Performed Yes    VAD Patient? No    PAD/SET Patient? No      Pain Assessment   Currently in Pain? No/denies                Social History   Tobacco Use  Smoking Status Former   Current packs/day: 0.00   Average packs/day: 1 pack/day for 53.0 years (53.0 ttl pk-yrs)   Types: Cigarettes   Start date: 04/14/1964   Quit date: 04/14/2017   Years since quitting: 5.9   Passive exposure: Past  Smokeless Tobacco Never  Tobacco Comments   Quit November 2018    Goals Met:  Independence with exercise equipment Exercise tolerated well No report of concerns or symptoms today  Goals Unmet:  Not Applicable  Comments: Pt able to follow exercise prescription today without complaint.  Will continue to monitor for progression.    Dr. Bethann Punches is Medical Director for [facility] Cardiac Rehabilitation.  Dr. Vida Rigger is Medical Director for [facility] Pulmonary Rehabilitation.

## 2023-04-06 NOTE — Telephone Encounter (Signed)
Pt scheduled to see Dr. Norma Fredrickson on 04/23/23

## 2023-04-09 ENCOUNTER — Emergency Department: Payer: HMO

## 2023-04-09 ENCOUNTER — Emergency Department
Admission: EM | Admit: 2023-04-09 | Discharge: 2023-04-09 | Disposition: A | Discharge status: Home / Self Care | Payer: HMO | Attending: Emergency Medicine | Admitting: Emergency Medicine

## 2023-04-09 ENCOUNTER — Other Ambulatory Visit: Payer: Self-pay

## 2023-04-09 DIAGNOSIS — R079 Chest pain, unspecified: Secondary | ICD-10-CM | POA: Insufficient documentation

## 2023-04-09 DIAGNOSIS — R0789 Other chest pain: Secondary | ICD-10-CM | POA: Diagnosis not present

## 2023-04-09 DIAGNOSIS — B889 Infestation, unspecified: Secondary | ICD-10-CM | POA: Diagnosis not present

## 2023-04-09 DIAGNOSIS — I2510 Atherosclerotic heart disease of native coronary artery without angina pectoris: Secondary | ICD-10-CM | POA: Diagnosis not present

## 2023-04-09 DIAGNOSIS — J432 Centrilobular emphysema: Secondary | ICD-10-CM | POA: Diagnosis not present

## 2023-04-09 DIAGNOSIS — E869 Volume depletion, unspecified: Secondary | ICD-10-CM | POA: Diagnosis not present

## 2023-04-09 DIAGNOSIS — I700 Atherosclerosis of aorta: Secondary | ICD-10-CM | POA: Diagnosis not present

## 2023-04-09 DIAGNOSIS — R001 Bradycardia, unspecified: Secondary | ICD-10-CM | POA: Diagnosis not present

## 2023-04-09 DIAGNOSIS — B888 Other specified infestations: Secondary | ICD-10-CM

## 2023-04-09 DIAGNOSIS — R918 Other nonspecific abnormal finding of lung field: Secondary | ICD-10-CM | POA: Diagnosis not present

## 2023-04-09 LAB — CBC
HCT: 31.5 % — ABNORMAL LOW (ref 39.0–52.0)
Hemoglobin: 9.2 g/dL — ABNORMAL LOW (ref 13.0–17.0)
MCH: 24.9 pg — ABNORMAL LOW (ref 26.0–34.0)
MCHC: 29.2 g/dL — ABNORMAL LOW (ref 30.0–36.0)
MCV: 85.4 fL (ref 80.0–100.0)
Platelets: 206 10*3/uL (ref 150–400)
RBC: 3.69 MIL/uL — ABNORMAL LOW (ref 4.22–5.81)
RDW: 17.5 % — ABNORMAL HIGH (ref 11.5–15.5)
WBC: 6.9 10*3/uL (ref 4.0–10.5)
nRBC: 0 % (ref 0.0–0.2)

## 2023-04-09 LAB — IFE+PROTEIN ELECTRO, 24-HR UR
% BETA, Urine: 31.9 %
ALPHA 1 URINE: 3.8 %
Albumin, U: 30.5 %
Alpha 2, Urine: 16.2 %
GAMMA GLOBULIN URINE: 17.6 %
Total Protein, Urine-Ur/day: 143 mg/(24.h) (ref 30–150)
Total Protein, Urine: 8.4 mg/dL
Total Volume: 1700

## 2023-04-09 LAB — BASIC METABOLIC PANEL
Anion gap: 8 (ref 5–15)
BUN: 33 mg/dL — ABNORMAL HIGH (ref 8–23)
CO2: 23 mmol/L (ref 22–32)
Calcium: 8.7 mg/dL — ABNORMAL LOW (ref 8.9–10.3)
Chloride: 106 mmol/L (ref 98–111)
Creatinine, Ser: 1.52 mg/dL — ABNORMAL HIGH (ref 0.61–1.24)
GFR, Estimated: 47 mL/min — ABNORMAL LOW (ref >60)
Glucose, Bld: 113 mg/dL — ABNORMAL HIGH (ref 70–99)
Potassium: 3.9 mmol/L (ref 3.5–5.1)
Sodium: 137 mmol/L (ref 135–145)

## 2023-04-09 LAB — TROPONIN I (HIGH SENSITIVITY)
Troponin I (High Sensitivity): 16 ng/L (ref <18)
Troponin I (High Sensitivity): 17 ng/L (ref <18)

## 2023-04-09 MED ORDER — IOHEXOL 350 MG/ML SOLN
100.0000 mL | Freq: Once | INTRAVENOUS | Status: AC | PRN
  Administered 2023-04-09: 75 mL via INTRAVENOUS

## 2023-04-09 NOTE — ED Provider Notes (Signed)
2nd troponin negative. CTA without acute findings. Discussed this with the patient. He is pain free. Will plan on discharging. Has follow up with cardiology already scheduled this week.   Phineas Semen, MD 04/09/23 [phone]

## 2023-04-09 NOTE — ED Triage Notes (Signed)
Pt arrived POV for left sided CP that started 40 mins PTA, pain radiates down into his abd. Also reporting SOB, but st that is not new, always SOB. Denies n/v/d. Pt is seen at cardiac rehab d/t post coronary stent placement back in Aug 2024 for NSTEMI, HX of COPD as well. Pt reporting the pain felt similar as to when he had his NSTEMI that was sharp and pressure, but the pain has eased off 1/10. A&O x4,

## 2023-04-09 NOTE — ED Provider Notes (Signed)
[facility] Provider Note    Event Date/Time   First MD Initiated Contact with Patient 04/09/23 0404     (approximate)   History   Chest Pain   HPI Marc Gray is a 77 y.o. male whose medical history includes but is not limited to COPD, adenocarcinoma of the left lung, atherosclerosis, and NSTEMI with stents and for which he goes to cardiac rehab.  He presents by private vehicle for evaluation of left-sided chest pain.  The patient says that he woke up early this morning and he went to the bathroom at which point he developed acute onset left-sided chest pain.  He reports that it radiated down into his abdomen.  It lasted about an hour but then completely resolved and he said he feels quite well now with no residual symptoms.  He had no acute shortness of breath greater than his chronic shortness of breath.  No numbness or weakness in his extremities.  He denies fever, nausea, vomiting, and abdominal pain except for the pain in the left side of his chest that was radiating downward.  He and his wife identified Dr. Darrold Junker as his cardiologist.     Physical Exam   Triage Vital Signs: ED Triage Vitals  Encounter Vitals Group     BP 04/09/23 0329 103/66     Systolic BP Percentile --      Diastolic BP Percentile --      Pulse Rate 04/09/23 0329 (!) 54     Resp 04/09/23 0329 20     Temp 04/09/23 0329 97.7 F (36.5 C)     Temp Source 04/09/23 0329 Oral     SpO2 04/09/23 0329 97 %     Weight 04/09/23 0327 117.1 kg (258 lb 2.5 oz)     Height 04/09/23 0327 1.854 m (6\' 1" )     Head Circumference --      Peak Flow --      Pain Score 04/09/23 0327 1     Pain Loc --      Pain Education --      Exclude from Growth Chart --     Most recent vital signs: Vitals:   04/09/23 0500 04/09/23 0530  BP: 127/64 125/78  Pulse: (!) 49 (!) 55  Resp: 17 15  Temp:    SpO2: 97% 99%    General: Awake, no distress.  Appears chronically ill but without  evidence of acute issue at this time. CV:  Good peripheral perfusion.  Regular rate and rhythm though slightly bradycardic. Resp:  Normal effort. Speaking easily and comfortably, no accessory muscle usage nor intercostal retractions.   Abd:  No distention.  No tenderness to palpation and no pulsatile abdominal masses. Other:  Mood and affect are normal.  Patient is in good spirits, conversant, laughing and joking with me.  Of note, I visualized and in fact captured in a specimen jar a bedbug off of the patient's clothing.  He commented that he was aware that they have a bedbug infestation at their home.   ED Results / Procedures / Treatments   Labs (all labs ordered are listed, but only abnormal results are displayed) Labs Reviewed  BASIC METABOLIC PANEL - Abnormal; Notable for the following components:      Result Value   Glucose, Bld 113 (*)    BUN 33 (*)    Creatinine, Ser 1.52 (*)    Calcium 8.7 (*)    GFR, Estimated 47 (*)  All other components within normal limits  CBC - Abnormal; Notable for the following components:   RBC 3.69 (*)    Hemoglobin 9.2 (*)    HCT 31.5 (*)    MCH 24.9 (*)    MCHC 29.2 (*)    RDW 17.5 (*)    All other components within normal limits  TROPONIN I (HIGH SENSITIVITY)  TROPONIN I (HIGH SENSITIVITY)     EKG  ED ECG REPORT I, Loleta Rose, the attending physician, personally viewed and interpreted this ECG.  Date: 04/09/2023 EKG Time: 3:27 AM Rate: 56 Rhythm: Sinus bradycardia with first-degree AV block QRS Axis: normal Intervals: Abnormal due to PR interval 220 ms, otherwise normal ST/T Wave abnormalities: normal Narrative Interpretation: no evidence of acute ischemia    RADIOLOGY I viewed and interpreted the patient's two-view chest x-ray.  I see no evidence of pneumothorax or pneumonia.  However the radiologist pointed out an area that could represent either atelectasis, pneumonia, or pulmonary  infarction.   PROCEDURES:  Critical Care performed: No  .1-3 Lead EKG Interpretation  Performed by: Loleta Rose, MD Authorized by: Loleta Rose, MD     Interpretation: abnormal     ECG rate:  50   ECG rate assessment: bradycardic     Rhythm: sinus bradycardia     Ectopy: none     Conduction: normal       IMPRESSION / MDM / ASSESSMENT AND PLAN / ED COURSE  I reviewed the triage vital signs and the nursing notes.                              Differential diagnosis includes, but is not limited to, angina, ACS including unstable angina, AAS, PE, less likely pneumonia or pneumothorax.  Patient's presentation is most consistent with acute presentation with potential threat to life or bodily function.  Labs/studies ordered: EKG, two-view chest x-ray, CTA PE protocol, CBC, BMP, high-sensitivity troponin x 2  Interventions/Medications given:  Medications  iohexol (OMNIPAQUE) 350 MG/ML injection 100 mL (has no administration in time range)    (Note:  hospital course my include additional interventions and/or labs/studies not listed above.)   Patient has a significant cardiac history but his chest pain is completely resolved and his EKG is reassuring with no evidence of ischemia.  I suspect the patient was suffering from angina but I also considered AAS given that he was reporting that his pain was radiating down into his abdomen.  His initial lab work is reassuring with stable chronic kidney disease with a GFR of 47 and creatinine of 1.52.  CBC is generally reassuring and initial high-sensitivity troponin is 16.  However, as documented above, there is some concern about the area on his chest x-ray that could represent pulmonary infarction.  Given the morbidity/mortality of a missed pulmonary embolism, I am obtaining a CTA chest PE study.  This should also be able to at least identify if there is any concern about an aortic aneurysm although I think it is less likely now that the  patient symptoms have completely resolved.  I informed the patient that if the rest of his workup is reassuring and his second troponin is negative, he will likely be appropriate for discharge and close outpatient follow-up with cardiology, and he and his wife understand and agree with the plan.  The patient is on the cardiac monitor to evaluate for evidence of arrhythmia and/or significant heart rate changes.  Clinical Course as of 04/09/23 0706  Mon Apr 09, 2023  0705 Troponin I (High Sensitivity): 17 Stable HS troponin, which is reassuring. [CF]  [id number] Transferring ED care to Dr. Derrill Kay to follow up on CTA results.  Anticipate discharge and outpatient follow up as previously described if the patient has reassuring CTA chest results and has remained pain-free. [CF]    Clinical Course User Index [CF] Loleta Rose, MD     FINAL CLINICAL IMPRESSION(S) / ED DIAGNOSES   Final diagnoses:  Chest pain, unspecified type  Infestation by bed bug     Rx / DC Orders   ED Discharge Orders     None        Note:  This document was prepared using Dragon voice recognition software and may include unintentional dictation errors.   Loleta Rose, MD 04/09/23 [phone]

## 2023-04-09 NOTE — ED Notes (Addendum)
Pts clothes stripped and doubled bagged into pt belongings bag. Hospital gown, socks, and mesh underwear given. Bed linens stripped and new bed sheet placed

## 2023-04-09 NOTE — Discharge Instructions (Signed)
Please seek medical attention for any high fevers, chest pain, shortness of breath, change in behavior, persistent vomiting, bloody stool or any other new or concerning symptoms.  

## 2023-04-09 NOTE — ED Notes (Signed)
Patient to CT at this time

## 2023-04-10 ENCOUNTER — Inpatient Hospital Stay: Payer: HMO | Attending: Oncology

## 2023-04-10 VITALS — BP 116/66 | HR 51 | Temp 98.3°F | Resp 17

## 2023-04-10 DIAGNOSIS — D509 Iron deficiency anemia, unspecified: Secondary | ICD-10-CM | POA: Diagnosis not present

## 2023-04-10 DIAGNOSIS — Z85118 Personal history of other malignant neoplasm of bronchus and lung: Secondary | ICD-10-CM | POA: Insufficient documentation

## 2023-04-10 DIAGNOSIS — D472 Monoclonal gammopathy: Secondary | ICD-10-CM | POA: Insufficient documentation

## 2023-04-10 DIAGNOSIS — C3492 Malignant neoplasm of unspecified part of left bronchus or lung: Secondary | ICD-10-CM

## 2023-04-10 DIAGNOSIS — E860 Dehydration: Secondary | ICD-10-CM

## 2023-04-10 MED ORDER — IRON SUCROSE 20 MG/ML IV SOLN
200.0000 mg | Freq: Once | INTRAVENOUS | Status: AC
  Administered 2023-04-10: 200 mg via INTRAVENOUS
  Filled 2023-04-10: qty 10

## 2023-04-10 MED ORDER — SODIUM CHLORIDE 0.9% FLUSH
10.0000 mL | Freq: Once | INTRAVENOUS | Status: AC | PRN
  Administered 2023-04-10: 10 mL
  Filled 2023-04-10: qty 10

## 2023-04-11 ENCOUNTER — Encounter: Payer: HMO | Admitting: *Deleted

## 2023-04-11 DIAGNOSIS — I214 Non-ST elevation (NSTEMI) myocardial infarction: Secondary | ICD-10-CM

## 2023-04-11 DIAGNOSIS — Z955 Presence of coronary angioplasty implant and graft: Secondary | ICD-10-CM | POA: Diagnosis not present

## 2023-04-11 NOTE — Progress Notes (Signed)
Daily Session Note  Patient Details  Name: Marc Gray MRN: [id number] Date of Birth: 03/28/1946 Referring Provider:   Flowsheet Row Cardiac Rehab from 02/14/2023 in [facility] Cardiac and Pulmonary Rehab  Referring Provider Dr. Marcina Millard       Encounter Date: 04/11/2023  Check In:  Session Check In - 04/11/23 1102       Check-In   Supervising physician immediately available to respond to emergencies See telemetry face sheet for immediately available ER MD    Location [facility]-Cardiac & Pulmonary Rehab    Staff Present Ronette Deter, BS, Exercise Physiologist;Joseph [place], Guinevere Ferrari, RN, ADN;[employer], , Exercise Physiologist    Virtual Visit No    Medication changes reported     No    Fall or balance concerns reported    No    Warm-up and Cool-down Performed on first and last piece of equipment    Resistance Training Performed Yes    VAD Patient? No    PAD/SET Patient? No      Pain Assessment   Currently in Pain? No/denies                Social History   Tobacco Use  Smoking Status Former   Current packs/day: 0.00   Average packs/day: 1 pack/day for 53.0 years (53.0 ttl pk-yrs)   Types: Cigarettes   Start date: 04/14/1964   Quit date: 04/14/2017   Years since quitting: 5.9   Passive exposure: Past  Smokeless Tobacco Never  Tobacco Comments   Quit November 2018    Goals Met:  Independence with exercise equipment Exercise tolerated well No report of concerns or symptoms today Strength training completed today  Goals Unmet:  Not Applicable  Comments: Pt able to follow exercise prescription today without complaint.  Will continue to monitor for progression.    Dr. Bethann Punches is Medical Director for [facility] Cardiac Rehabilitation.  Dr. Vida Rigger is Medical Director for [facility] Pulmonary Rehabilitation.

## 2023-04-12 ENCOUNTER — Ambulatory Visit: Payer: HMO | Admitting: Neurosurgery

## 2023-04-12 DIAGNOSIS — J8283 Eosinophilic asthma: Secondary | ICD-10-CM | POA: Diagnosis not present

## 2023-04-12 DIAGNOSIS — I214 Non-ST elevation (NSTEMI) myocardial infarction: Secondary | ICD-10-CM | POA: Diagnosis not present

## 2023-04-12 DIAGNOSIS — M4316 Spondylolisthesis, lumbar region: Secondary | ICD-10-CM

## 2023-04-12 DIAGNOSIS — C3492 Malignant neoplasm of unspecified part of left bronchus or lung: Secondary | ICD-10-CM | POA: Diagnosis not present

## 2023-04-12 DIAGNOSIS — R0602 Shortness of breath: Secondary | ICD-10-CM | POA: Diagnosis not present

## 2023-04-12 DIAGNOSIS — J4540 Moderate persistent asthma, uncomplicated: Secondary | ICD-10-CM | POA: Diagnosis not present

## 2023-04-12 DIAGNOSIS — M545 Low back pain, unspecified: Secondary | ICD-10-CM

## 2023-04-12 DIAGNOSIS — G8929 Other chronic pain: Secondary | ICD-10-CM

## 2023-04-12 NOTE — Progress Notes (Signed)
I spoke to [name] today at length regarding my discussion with Dr. Myer Haff in regards to his back pain.  Unfortunately Dr. Marcell Barlow does not feel that neuro surgical intervention is the best course of treatment for this patient.  I briefly discussed the possibility of a spinal cord stimulator versus considering facet injections with the patient and he would like to see a pain management provider to discuss these options in further detail.  I will place a referral to Dr. Cherylann Ratel for this. He is also not heard back regarding the vascular and neurology referrals that I placed at his visit.  There are neurology referral appears closed but the patient was never scheduled.  I reach out to my office to look into this.  I have also reached out to vascular to check on that referral.   I encouraged the patient to give our office a call should he have any questions or concerns or not hear back in regards to these 3 referrals by the end of next week.

## 2023-04-13 ENCOUNTER — Ambulatory Visit: Payer: HMO

## 2023-04-13 ENCOUNTER — Encounter: Payer: HMO | Admitting: *Deleted

## 2023-04-13 DIAGNOSIS — I214 Non-ST elevation (NSTEMI) myocardial infarction: Secondary | ICD-10-CM

## 2023-04-13 DIAGNOSIS — Z955 Presence of coronary angioplasty implant and graft: Secondary | ICD-10-CM | POA: Diagnosis not present

## 2023-04-13 NOTE — Progress Notes (Signed)
Daily Session Note  Patient Details  Name: Marc Gray MRN: [id number] Date of Birth: February 08, 1946 Referring Provider:   Flowsheet Row Cardiac Rehab from 02/14/2023 in [facility] Cardiac and Pulmonary Rehab  Referring Provider Dr. Marcina Millard       Encounter Date: 04/13/2023  Check In:  Session Check In - 04/13/23 1122       Check-In   Supervising physician immediately available to respond to emergencies See telemetry face sheet for immediately available ER MD    Location [facility]-Cardiac & Pulmonary Rehab    Staff Present Cora Collum, RN, BSN, CCRP;Noah Tickle, BS, Exercise Physiologist;Joseph [place], [place]    Virtual Visit No    Medication changes reported     No    Fall or balance concerns reported    No    Warm-up and Cool-down Performed on first and last piece of equipment    Resistance Training Performed Yes    VAD Patient? No    PAD/SET Patient? No      Pain Assessment   Currently in Pain? No/denies                Social History   Tobacco Use  Smoking Status Former   Current packs/day: 0.00   Average packs/day: 1 pack/day for 53.0 years (53.0 ttl pk-yrs)   Types: Cigarettes   Start date: 04/14/1964   Quit date: 04/14/2017   Years since quitting: 6.0   Passive exposure: Past  Smokeless Tobacco Never  Tobacco Comments   Quit November 2018    Goals Met:  Independence with exercise equipment Exercise tolerated well No report of concerns or symptoms today  Goals Unmet:  Not Applicable  Comments: Pt able to follow exercise prescription today without complaint.  Will continue to monitor for progression.    Dr. Bethann Punches is Medical Director for [facility] Cardiac Rehabilitation.  Dr. Vida Rigger is Medical Director for [facility] Pulmonary Rehabilitation.

## 2023-04-16 ENCOUNTER — Encounter: Payer: HMO | Admitting: *Deleted

## 2023-04-16 VITALS — Ht 72.0 in | Wt 247.1 lb

## 2023-04-16 DIAGNOSIS — Z955 Presence of coronary angioplasty implant and graft: Secondary | ICD-10-CM | POA: Diagnosis not present

## 2023-04-16 DIAGNOSIS — I214 Non-ST elevation (NSTEMI) myocardial infarction: Secondary | ICD-10-CM

## 2023-04-16 NOTE — Progress Notes (Signed)
Daily Session Note  Patient Details  Name: Marc Gray MRN: [id number] Date of Birth: [birth year] Referring Provider:   Flowsheet Row Cardiac Rehab from 02/14/2023 in [facility] Cardiac and Pulmonary Rehab  Referring Provider Dr. Marcina Millard       Encounter Date: 04/16/2023  Check In:  Session Check In - 04/16/23 1118       Check-In   Supervising physician immediately available to respond to emergencies See telemetry face sheet for immediately available ER MD    Location [facility]-Cardiac & Pulmonary Rehab    Staff Present Rory Percy, MS, Exercise Physiologist;Krista Karleen [place] RN, Mabeline Caras, BS, ACSM CEP, Exercise Physiologist; Mcnicholas Katrinka Blazing, RN, ADN    Virtual Visit No    Medication changes reported     No    Fall or balance concerns reported    No    Warm-up and Cool-down Performed on first and last piece of equipment    Resistance Training Performed Yes    VAD Patient? No    PAD/SET Patient? No      Pain Assessment   Currently in Pain? No/denies                Social History   Tobacco Use  Smoking Status Former   Current packs/day: 0.00   Average packs/day: 1 pack/day for 53.0 years (53.0 ttl pk-yrs)   Types: Cigarettes   Start date: 04/14/1964   Quit date: 04/14/2017   Years since quitting: 6.0   Passive exposure: Past  Smokeless Tobacco Never  Tobacco Comments   Quit November 2018    Goals Met:  Independence with exercise equipment Exercise tolerated well Personal goals reviewed No report of concerns or symptoms today Strength training completed today  Goals Unmet:  Not Applicable  Comments: Pt able to follow exercise prescription today without complaint.  Will continue to monitor for progression.   6 Minute Walk     Row Name 02/14/23 1350 04/16/23 1129       6 Minute Walk   Phase Initial Discharge    Distance 690 feet 570 feet    Distance % Change -- -17 %    Distance Feet Change -- -120 ft    Walk Time 6 minutes 5.25  minutes    # of Rest Breaks 0 1    MPH 1.31 1.23    METS 1.34 1.1    RPE 15 12    Perceived Dyspnea  3 3    VO2 Peak 4.68 3.85    Symptoms Yes (comment) Yes (comment)    Comments back pain while walking 8/10 relieved with rest. SOB 3 back pain while walking 8/10 relieved with rest. SOB 3    Resting HR 61 bpm 82 bpm    Resting BP 138/70 122/70    Resting Oxygen Saturation  96 % 94 %    Exercise Oxygen Saturation  during 6 min walk 97 % 92 %    Max Ex. HR 109 bpm 107 bpm    Max Ex. BP 142/80 138/62    2 Minute Post BP 130/78 128/82              Cardiac:  Reviewed home exercise with pt today.  Pt plans to use peddle machine for exercise.  Reviewed THR, pulse, RPE, sign and symptoms, pulse oximetery and when to call 911 or MD.  Also discussed weather considerations and indoor options.  Pt voiced understanding.    Dr. Bethann Punches is Medical Director for [facility] Cardiac Rehabilitation.  Dr. Vida Rigger is Medical Director for [facility] Pulmonary Rehabilitation.

## 2023-04-16 NOTE — Patient Instructions (Signed)
Discharge Patient Instructions  Patient Details  Name: Marc Gray MRN: [id number] Date of Birth: 1946-03-19 Referring Provider:  [name], MD   Number of Visits: 36  Reason for Discharge:  Patient reached a stable level of exercise. Patient independent in their exercise. Patient has met program and personal goals.  Smoking History:  Social History   Tobacco Use  Smoking Status Former   Current packs/day: 0.00   Average packs/day: 1 pack/day for 53.0 years (53.0 ttl pk-yrs)   Types: Cigarettes   Start date: 04/14/1964   Quit date: 04/14/2017   Years since quitting: 6.0   Passive exposure: Past  Smokeless Tobacco Never  Tobacco Comments   Quit November 2018    Diagnosis:  Status post coronary artery stent placement  NSTEMI (non-ST elevated myocardial infarction) [facility])  Initial Exercise Prescription:  Initial Exercise Prescription - 02/14/23 1300       Date of Initial Exercise RX and Referring Provider   Date 02/14/23    Referring Provider Dr. Marcina Millard      Recumbant Bike   Level 1    RPM 50    Watts 15    Minutes 15      NuStep   Level 1    SPM 80    Minutes 15    METs 1.34      REL-XR   Level 1    Speed 50    Minutes 15    METs 1.34      Biostep-RELP   Level 1    SPM 50    Minutes 15    METs 1.34      Track   Laps 10    Minutes 15    METs 1.54      Prescription Details   Frequency (times per week) 3    Duration Progress to 30 minutes of continuous aerobic without signs/symptoms of physical distress      Intensity   THRR 40-80% of Max Heartrate 94-127    Ratings of Perceived Exertion 11-13    Perceived Dyspnea 0-4      Progression   Progression Continue to progress workloads to maintain intensity without signs/symptoms of physical distress.      Resistance Training   Training Prescription Yes    Weight 3    Reps 10-15             Discharge Exercise Prescription (Final Exercise Prescription  Changes):  Exercise Prescription Changes - 04/03/23 0700       Response to Exercise   Blood Pressure (Admit) 128/58    Blood Pressure (Exercise) 132/54    Blood Pressure (Exit) 132/64    Heart Rate (Admit) 65 bpm    Heart Rate (Exercise) 110 bpm    Heart Rate (Exit) 76 bpm    Rating of Perceived Exertion (Exercise) 13    Perceived Dyspnea (Exercise) 0    Symptoms none    Duration Progress to 30 minutes of  aerobic without signs/symptoms of physical distress    Intensity THRR unchanged      Progression   Progression Continue to progress workloads to maintain intensity without signs/symptoms of physical distress.    Average METs 2.3      Resistance Training   Training Prescription Yes    Weight 3 lb    Reps 10-15      Interval Training   Interval Training No      Recumbant Bike   Level 2    Watts 18  Minutes 15      NuStep   Level 2    Minutes 15    METs 2.9      T5 Nustep   Level 1    Minutes 15    METs 1.9      Biostep-RELP   Level 2    Minutes 15    METs 2      Oxygen   Maintain Oxygen Saturation 88% or higher             Functional Capacity:  6 Minute Walk     Row Name 02/14/23 1350 04/16/23 1129       6 Minute Walk   Phase Initial Discharge    Distance 690 feet 570 feet    Distance % Change -- -17 %    Distance Feet Change -- -120 ft    Walk Time 6 minutes 5.25 minutes    # of Rest Breaks 0 1    MPH 1.31 1.23    METS 1.34 1.1    RPE 15 12    Perceived Dyspnea  3 3    VO2 Peak 4.68 3.85    Symptoms Yes (comment) Yes (comment)    Comments back pain while walking 8/10 relieved with rest. SOB 3 back pain while walking 8/10 relieved with rest. SOB 3    Resting HR 61 bpm 82 bpm    Resting BP 138/70 122/70    Resting Oxygen Saturation  96 % 94 %    Exercise Oxygen Saturation  during 6 min walk 97 % 92 %    Max Ex. HR 109 bpm 107 bpm    Max Ex. BP 142/80 138/62    2 Minute Post BP 130/78 128/82            Nutrition & Weight -  Outcomes:  Pre Biometrics - 02/14/23 1357       Pre Biometrics   Height 6' (1.829 m)    Weight 249 lb 12.8 oz (113.3 kg)    Waist Circumference 52 inches    Hip Circumference 48 inches    Waist to Hip Ratio 1.08 %    BMI (Calculated) 33.87    Single Leg Stand 7.03 seconds             Post Biometrics - 04/16/23 1130        Post  Biometrics   Height 6' (1.829 m)    Weight 247 lb 1.6 oz (112.1 kg)    Waist Circumference 51.5 inches    Hip Circumference 47.5 inches    Waist to Hip Ratio 1.08 %    BMI (Calculated) 33.51    Single Leg Stand 3.62 seconds             Nutrition:  Nutrition Therapy & Goals - 02/14/23 1335       Nutrition Therapy   Diet Cardiac, Low Na    Protein (specify units) 90    Fiber 30 grams    Whole Grain Foods 3 servings    Saturated Fats 15 max. grams    Fruits and Vegetables 5 servings/day    Sodium 2 grams      Personal Nutrition Goals   Nutrition Goal Drink 3 bottles of water daily    Personal Goal #2 Use healthy fats to promote hearth health, but watch calories    Personal Goal #3 Pair carb with protein at meal    Comments Patient drinking 2 bottles of water, ~32oz daily. Knows he needs  to drink more, discussed goal of 3 bottles per day. He reports he tries to eat 3 meals per day. Sometimes eats late in the evening. Educated on [employer] handout, types of fats, sources, and how to read label. His nutrition knowledge was limited but kept things simple and used nutritional food groups sheet as reference to help him build more balanced meals with foods he likes and will focus on smaller portions of meat, leaner meat choices, more colorful produce and complex carbs paired with good protein or healthy fats. Answered questions he had around, saturated fat, sodium, fiber and cholesterol.      Intervention Plan   Intervention Prescribe, educate and counsel regarding individualized specific dietary modifications aiming towards targeted core  components such as weight, hypertension, lipid management, diabetes, heart failure and other comorbidities.;Nutrition handout(s) given to patient.    Expected Outcomes Short Term Goal: Understand basic principles of dietary content, such as calories, fat, sodium, cholesterol and nutrients.;Short Term Goal: A plan has been developed with personal nutrition goals set during dietitian appointment.;Long Term Goal: Adherence to prescribed nutrition plan.             Goals reviewed with patient; copy given to patient.

## 2023-04-17 ENCOUNTER — Inpatient Hospital Stay: Payer: HMO

## 2023-04-17 VITALS — BP 96/58 | HR 50 | Temp 97.0°F | Resp 17

## 2023-04-17 DIAGNOSIS — D509 Iron deficiency anemia, unspecified: Secondary | ICD-10-CM

## 2023-04-17 DIAGNOSIS — C3492 Malignant neoplasm of unspecified part of left bronchus or lung: Secondary | ICD-10-CM

## 2023-04-17 DIAGNOSIS — E860 Dehydration: Secondary | ICD-10-CM

## 2023-04-17 MED ORDER — SODIUM CHLORIDE 0.9% FLUSH
10.0000 mL | Freq: Once | INTRAVENOUS | Status: AC | PRN
  Administered 2023-04-17: 10 mL
  Filled 2023-04-17: qty 10

## 2023-04-17 MED ORDER — IRON SUCROSE 20 MG/ML IV SOLN
200.0000 mg | Freq: Once | INTRAVENOUS | Status: AC
  Administered 2023-04-17: 200 mg via INTRAVENOUS

## 2023-04-17 NOTE — Patient Instructions (Signed)
Iron Sucrose Injection What is this medication? IRON SUCROSE (EYE ern SOO krose) treats low levels of iron (iron deficiency anemia) in people with kidney disease. Iron is a mineral that plays an important role in making red blood cells, which carry oxygen from your lungs to the rest of your body. This medicine may be used for other purposes; ask your health care provider or pharmacist if you have questions. COMMON BRAND NAME(S): Venofer What should I tell my care team before I take this medication? They need to know if you have any of these conditions: Anemia not caused by low iron levels Heart disease High levels of iron in the blood Kidney disease Liver disease An unusual or allergic reaction to iron, other medications, foods, dyes, or preservatives Pregnant or trying to get pregnant Breastfeeding How should I use this medication? This medication is for infusion into a vein. It is given in a hospital or clinic setting. Talk to your care team about the use of this medication in children. While this medication may be prescribed for children as young as 2 years for selected conditions, precautions do apply. Overdosage: If you think you have taken too much of this medicine contact a poison control center or emergency room at once. NOTE: This medicine is only for you. Do not share this medicine with others. What if I miss a dose? Keep appointments for follow-up doses. It is important not to miss your dose. Call your care team if you are unable to keep an appointment. What may interact with this medication? Do not take this medication with any of the following: Deferoxamine Dimercaprol Other iron products This medication may also interact with the following: Chloramphenicol Deferasirox This list may not describe all possible interactions. Give your health care provider a list of all the medicines, herbs, non-prescription drugs, or dietary supplements you use. Also tell them if you smoke,  drink alcohol, or use illegal drugs. Some items may interact with your medicine. What should I watch for while using this medication? Visit your care team regularly. Tell your care team if your symptoms do not start to get better or if they get worse. You may need blood work done while you are taking this medication. You may need to follow a special diet. Talk to your care team. Foods that contain iron include: whole grains/cereals, dried fruits, beans, or peas, leafy green vegetables, and organ meats (liver, kidney). What side effects may I notice from receiving this medication? Side effects that you should report to your care team as soon as possible: Allergic reactions--skin rash, itching, hives, swelling of the face, lips, tongue, or throat Low blood pressure--dizziness, feeling faint or lightheaded, blurry vision Shortness of breath Side effects that usually do not require medical attention (report to your care team if they continue or are bothersome): Flushing Headache Joint pain Muscle pain Nausea Pain, redness, or irritation at injection site This list may not describe all possible side effects. Call your doctor for medical advice about side effects. You may report side effects to FDA at 1-800-FDA-1088. Where should I keep my medication? This medication is given in a hospital or clinic. It will not be stored at home. NOTE: This sheet is a summary. It may not cover all possible information. If you have questions about this medicine, talk to your doctor, pharmacist, or health care provider.  2024 Elsevier/Gold Standard (2022-10-20 00:00:00)

## 2023-04-17 NOTE — Progress Notes (Signed)
Patient tolerated Venofer infusion well. Explained recommendation of 30 min post monitoring. Patient refused to wait post monitoring. Educated on what signs to watch for & to call with any concerns. No questions, discharged. Stable  

## 2023-04-18 ENCOUNTER — Encounter: Payer: Self-pay | Admitting: *Deleted

## 2023-04-18 ENCOUNTER — Encounter: Payer: HMO | Admitting: *Deleted

## 2023-04-18 DIAGNOSIS — I214 Non-ST elevation (NSTEMI) myocardial infarction: Secondary | ICD-10-CM

## 2023-04-18 DIAGNOSIS — Z955 Presence of coronary angioplasty implant and graft: Secondary | ICD-10-CM

## 2023-04-18 NOTE — Progress Notes (Signed)
Daily Session Note  Patient Details  Name: Marc Gray MRN: [id number] Date of Birth: 10-11-1945 Referring Provider:   Flowsheet Row Cardiac Rehab from 02/14/2023 in [facility] Card[facility]  Referring Provider Dr. Marcina Millard       Encounter Date: 04/18/2023  Check In:  Session Check In - 04/18/23 1115       Check-In   Supervising physician immediately available to respond to emergencies See telemetry face sheet for immediately available ER MD    Location [facility]-Cardiac & Pulmonary Rehab    Staff Present Rory Percy, MS, Exercise Physiologist;Meredith Jewel Baize, RN BSN;Joseph Hood, RCP,RRT,BSRT;Maxon Harmony BS, , Exercise Physiologist;Reade Trefz Katrinka Blazing, RN, ADN    Virtual Visit No    Medication changes reported     No    Fall or balance concerns reported    No    Warm-up and Cool-down Performed on first and last piece of equipment    Resistance Training Performed Yes    VAD Patient? No    PAD/SET Patient? No      Pain Assessment   Currently in Pain? No/denies                Social History   Tobacco Use  Smoking Status Former   Current packs/day: 0.00   Average packs/day: 1 pack/day for 53.0 years (53.0 ttl pk-yrs)   Types: Cigarettes   Start date: 04/14/1964   Quit date: 04/14/2017   Years since quitting: 6.0   Passive exposure: Past  Smokeless Tobacco Never  Tobacco Comments   Quit November 2018    Goals Met:  Independence with exercise equipment Exercise tolerated well No report of concerns or symptoms today Strength training completed today  Goals Unmet:  Not Applicable  Comments: Pt able to follow exercise prescription today without complaint.  Will continue to monitor for progression.    Dr. Bethann Punches is Medical Director for [facility] Cardiac Rehabilitation.  Dr. Vida Rigger is Medical Director for [facility] Pulmonary Rehabilitation.

## 2023-04-18 NOTE — Progress Notes (Signed)
Cardiac Individual Treatment Plan  Patient Details  Name: Casimir Grissett MRN: [id number] Date of Birth: 1946-02-14 Referring Provider:   Flowsheet Row Cardiac Rehab from 02/14/2023 in [facility] Cardiac and Pulmonary Rehab  Referring Provider Dr. Marcina Millard       Initial Encounter Date:  Flowsheet Row Cardiac Rehab from 02/14/2023 in [facility] Cardiac and Pulmonary Rehab  Date 02/14/23       Visit Diagnosis: Status post coronary artery stent placement  NSTEMI (non-ST elevated myocardial infarction) [facility])  Patient's Home Medications on Admission:  Current Outpatient Medications:    acetaminophen (TYLENOL) 500 MG tablet, Take 1,000 mg by mouth every 6 (six) hours as needed for moderate pain., Disp: , Rfl:    aspirin EC 81 MG tablet, Take 1 tablet (81 mg total) by mouth daily. Swallow whole., Disp: 90 tablet, Rfl: 0   Azelastine HCl 137 MCG/SPRAY SOLN, SMARTSIG:1-2 Spray(s) Both Nares Twice Daily, Disp: , Rfl:    BREZTRI AEROSPHERE 160-9-4.8 MCG/ACT AERO, Inhale 2 puffs into the lungs 2 (two) times daily., Disp: , Rfl:    calcium-vitamin D (OSCAL WITH D) 500-200 MG-UNIT tablet, Take 1 tablet by mouth daily., Disp: 90 tablet, Rfl: 1   diphenhydrAMINE-zinc acetate (BENADRYL EXTRA STRENGTH) cream, Apply 1 application topically 3 (three) times daily as needed for itching., Disp: 28 g, Rfl: 0   DUPIXENT 300 MG/2ML SOPN, Inject 300 mg into the skin every 14 (fourteen) days., Disp: , Rfl:    EPINEPHrine 0.3 mg/0.3 mL IJ SOAJ injection, Inject 0.3 mg into the muscle as needed., Disp: , Rfl:    fluticasone (FLONASE) 50 MCG/ACT nasal spray, Place 1 spray into both nostrils daily., Disp: 16 g, Rfl: 2   furosemide (LASIX) 20 MG tablet, Take by mouth., Disp: , Rfl:    Ipratropium-Albuterol (COMBIVENT) 20-100 MCG/ACT AERS respimat, Inhale into the lungs., Disp: , Rfl:    levothyroxine (SYNTHROID) 137 MCG tablet, Take by mouth., Disp: , Rfl:    Melatonin 10 MG TABS, Take 10 mg by mouth at  bedtime. , Disp: , Rfl:    metoprolol tartrate (LOPRESSOR) 25 MG tablet, Take 0.5 tablets (12.5 mg total) by mouth 2 (two) times daily., Disp: 30 tablet, Rfl: 2   montelukast (SINGULAIR) 10 MG tablet, Take 10 mg by mouth daily., Disp: , Rfl:    Multiple Vitamin (MULTI-VITAMINS) TABS, Take 1 tablet by mouth daily. , Disp: , Rfl:    pantoprazole (PROTONIX) 40 MG tablet, Take 1 tablet (40 mg total) by mouth daily., Disp: 90 tablet, Rfl: 0   polyethylene glycol-electrolytes (NULYTELY) 420 g solution, MIX AND DRINK AS DIRECTED, Disp: , Rfl:    prasugrel (EFFIENT) 10 MG TABS tablet, Take 1 tablet (10 mg total) by mouth daily., Disp: 30 tablet, Rfl: 11   predniSONE (DELTASONE) 5 MG tablet, Take 5 mg by mouth daily., Disp: , Rfl:    prochlorperazine (COMPAZINE) 10 MG tablet, Take 1 tablet (10 mg total) by mouth every 6 (six) hours as needed for nausea or vomiting., Disp: 60 tablet, Rfl: 2   rosuvastatin (CRESTOR) 10 MG tablet, , Disp: , Rfl:    SHINGRIX injection, , Disp: , Rfl:    SPIKEVAX syringe, , Disp: , Rfl:    tadalafil (CIALIS) 5 MG tablet, Take 5-10mg  by mouth as needed prior to intercourse, Disp: 30 tablet, Rfl: 11   tamsulosin (FLOMAX) 0.4 MG CAPS capsule, Take 1 capsule (0.4 mg total) by mouth daily., Disp: 90 capsule, Rfl: 3   traZODone (DESYREL) 100 MG tablet,  Take 100 mg by mouth at bedtime., Disp: , Rfl:    triamcinolone ointment (KENALOG) 0.5 %, Apply 1 application topically 3 (three) times daily as needed., Disp: 30 g, Rfl: 1 No current facility-administered medications for this visit.  Facility-Administered Medications Ordered in Other Visits:    sodium chloride flush (NS) 0.9 % injection 10 mL, 10 mL, Intravenous, PRN, Rickard Patience, MD, 10 mL at 04/03/17 1610  Past Medical History: Past Medical History:  Diagnosis Date   Anxiety    Aortic atherosclerosis (HCC)    Arthritis    SHOULDER   BPH (benign prostatic hyperplasia)    Cataract    Chronic kidney disease    low kidney  function   Colon adenomas    Colon cancer (HCC) 2015   Pt states during his colonoscopy he had cancerous polyps removed.    COPD (chronic obstructive pulmonary disease) (HCC)    Depression    SINCE DIAGNOSIS   Dyspnea    DOE   Enlarged prostate    GERD (gastroesophageal reflux disease)    Hypertension    Hypothyroidism    Lung cancer (HCC)    Aug 2018   Pain    DUE TO LUNG ISSUE    Tobacco Use: Social History   Tobacco Use  Smoking Status Former   Current packs/day: 0.00   Average packs/day: 1 pack/day for 53.0 years (53.0 ttl pk-yrs)   Types: Cigarettes   Start date: 04/14/1964   Quit date: 04/14/2017   Years since quitting: 6.0   Passive exposure: Past  Smokeless Tobacco Never  Tobacco Comments   Quit November 2018    Labs: Review Flowsheet       Latest Ref Rng & Units 12/28/2022  Labs for ITP Cardiac and Pulmonary Rehab  Cholestrol 0 - 200 mg/dL 960   LDL (calc) 0 - 99 mg/dL 59   HDL-C >45 mg/dL 54   Trlycerides <409 mg/dL 74   Hemoglobin W1X 4.8 - 5.6 % 5.8     Details             Exercise Target Goals: Exercise Program Goal: Individual exercise prescription set using results from initial 6 min walk test and THRR while considering  patient's activity barriers and safety.   Exercise Prescription Goal: Initial exercise prescription builds to 30-45 minutes a day of aerobic activity, 2-3 days per week.  Home exercise guidelines will be given to patient during program as part of exercise prescription that the participant will acknowledge.   Education: Aerobic Exercise: - Group verbal and visual presentation on the components of exercise prescription. Introduces F.I.T.T principle from ACSM for exercise prescriptions.  Reviews F.I.T.T. principles of aerobic exercise including progression. Written material given at graduation. Flowsheet Row Cardiac Rehab from 04/11/2023 in [facility] Cardiac and Pulmonary Rehab  Education need identified 02/14/23        Education: Resistance Exercise: - Group verbal and visual presentation on the components of exercise prescription. Introduces F.I.T.T principle from ACSM for exercise prescriptions  Reviews F.I.T.T. principles of resistance exercise including progression. Written material given at graduation. Flowsheet Row Cardiac Rehab from 04/11/2023 in [facility] Cardiac and Pulmonary Rehab  Date 03/21/23  Educator NT  Instruction Review Code 1- Verbalizes Understanding        Education: Exercise & Equipment Safety: - Individual verbal instruction and demonstration of equipment use and safety with use of the equipment. Flowsheet Row Cardiac Rehab from 04/11/2023 in [facility] Cardiac and Pulmonary Rehab  Date 02/14/23  Educator [employer]  Instruction Review Code 1- Verbalizes Understanding       Education: Exercise Physiology & General Exercise Guidelines: - Group verbal and written instruction with models to review the exercise physiology of the cardiovascular system and associated critical values. Provides general exercise guidelines with specific guidelines to those with heart or lung disease.  Flowsheet Row Cardiac Rehab from 04/11/2023 in [facility] Cardiac and Pulmonary Rehab  Education need identified 02/14/23  Date 03/14/23  Educator NT  Instruction Review Code 1- [employer] Understanding       Education: Flexibility, Balance, Mind/Body Relaxation: - Group verbal and visual presentation with interactive activity on the components of exercise prescription. Introduces F.I.T.T principle from ACSM for exercise prescriptions. Reviews F.I.T.T. principles of flexibility and balance exercise training including progression. Also discusses the mind body connection.  Reviews various relaxation techniques to help reduce and manage stress (i.e. Deep breathing, progressive muscle relaxation, and visualization). Balance handout provided to take home. Written material given at graduation. Flowsheet Row Cardiac Rehab from  04/11/2023 in [facility] Cardiac and Pulmonary Rehab  Date 03/21/23  Educator NT  Instruction Review Code 1- Verbalizes Understanding       Activity Barriers & Risk Stratification:  Activity Barriers & Cardiac Risk Stratification - 02/14/23 1351       Activity Barriers & Cardiac Risk Stratification   Activity Barriers Arthritis;Balance Concerns;Back Problems    Cardiac Risk Stratification High             6 Minute Walk:  6 Minute Walk     Row Name 02/14/23 1350 04/16/23 1129       6 Minute Walk   Phase Initial Discharge    Distance 690 feet 570 feet    Distance % Change -- -17 %    Distance Feet Change -- -120 ft    Walk Time 6 minutes 5.25 minutes    # of Rest Breaks 0 1    MPH 1.31 1.23    METS 1.34 1.1    RPE 15 12    Perceived Dyspnea  3 3    VO2 Peak 4.68 3.85    Symptoms Yes (comment) Yes (comment)    Comments back pain while walking 8/10 relieved with rest. SOB 3 back pain while walking 8/10 relieved with rest. SOB 3    Resting HR 61 bpm 82 bpm    Resting BP 138/70 122/70    Resting Oxygen Saturation  96 % 94 %    Exercise Oxygen Saturation  during 6 min walk 97 % 92 %    Max Ex. HR 109 bpm 107 bpm    Max Ex. BP 142/80 138/62    2 Minute Post BP 130/78 128/82             Oxygen Initial Assessment:   Oxygen Re-Evaluation:   Oxygen Discharge (Final Oxygen Re-Evaluation):   Initial Exercise Prescription:  Initial Exercise Prescription - 02/14/23 1300       Date of Initial Exercise RX and Referring Provider   Date 02/14/23    Referring Provider Dr. Marcina Millard      Recumbant Bike   Level 1    RPM 50    Watts 15    Minutes 15      NuStep   Level 1    SPM 80    Minutes 15    METs 1.34      REL-XR   Level 1    Speed 50    Minutes 15    METs 1.34  Biostep-RELP   Level 1    SPM 50    Minutes 15    METs 1.34      Track   Laps 10    Minutes 15    METs 1.54      Prescription Details   Frequency (times per week) 3     Duration Progress to 30 minutes of continuous aerobic without signs/symptoms of physical distress      Intensity   THRR 40-80% of Max Heartrate 94-127    Ratings of Perceived Exertion 11-13    Perceived Dyspnea 0-4      Progression   Progression Continue to progress workloads to maintain intensity without signs/symptoms of physical distress.      Resistance Training   Training Prescription Yes    Weight 3    Reps 10-15             Perform Capillary Blood Glucose checks as needed.  Exercise Prescription Changes:   Exercise Prescription Changes     Row Name 02/14/23 1300 03/06/23 0700 03/21/23 1100 04/03/23 0700 04/16/23 1100     Response to Exercise   Blood Pressure (Admit) 138/70 118/62 116/58 128/58 --   Blood Pressure (Exercise) 142/80 142/68 128/64 132/54 --   Blood Pressure (Exit) 130/78 138/70 138/62 132/64 --   Heart Rate (Admit) 61 bpm 74 bpm 86 bpm 65 bpm --   Heart Rate (Exercise) 109 bpm 113 bpm 103 bpm 110 bpm --   Heart Rate (Exit) 66 bpm 72 bpm 93 bpm 76 bpm --   Oxygen Saturation (Admit) 96 % -- -- -- --   Oxygen Saturation (Exercise) 97 % -- -- -- --   Oxygen Saturation (Exit) 96 % -- -- -- --   Rating of Perceived Exertion (Exercise) 15 15 14 13  --   Perceived Dyspnea (Exercise) 3 0 -- 0 --   Symptoms back pain 8/10 while walking and SOB -- none none --   Comments 6 MWT results -- -- -- --   Duration -- Progress to 30 minutes of  aerobic without signs/symptoms of physical distress Progress to 30 minutes of  aerobic without signs/symptoms of physical distress Progress to 30 minutes of  aerobic without signs/symptoms of physical distress Progress to 30 minutes of  aerobic without signs/symptoms of physical distress   Intensity -- THRR unchanged THRR unchanged THRR unchanged THRR unchanged     Progression   Progression -- Continue to progress workloads to maintain intensity without signs/symptoms of physical distress. Continue to progress workloads to  maintain intensity without signs/symptoms of physical distress. Continue to progress workloads to maintain intensity without signs/symptoms of physical distress. Continue to progress workloads to maintain intensity without signs/symptoms of physical distress.   Average METs -- 2.5 2.4 2.3 2.3     Resistance Training   Training Prescription -- Yes Yes Yes Yes   Weight -- 3 lb 3 lb 3 lb 3 lb   Reps -- 10-15 10-15 10-15 10-15     Interval Training   Interval Training -- No No No No     Recumbant Bike   Level -- 4 2 2 2    Watts -- 15 18 18 18    Minutes -- 15 15 15 15      NuStep   Level -- 4 2 2 2    Minutes -- 15 15 15 15    METs -- 3.3 3.4 2.9 2.9     REL-XR   Level -- 2 -- -- --   Minutes --  15 -- -- --   METs -- 3.4 -- -- --     T5 Nustep   Level -- -- 2 1 1    Minutes -- -- 15 15 15    METs -- -- 2.1 1.9 1.9     Biostep-RELP   Level -- 1 1 2 2    Minutes -- 15 15 15 15    METs -- 2 2 2 2      Track   Laps -- 16 -- -- --   Minutes -- 15 -- -- --   METs -- 1.87 -- -- --     Home Exercise Plan   Plans to continue exercise at -- -- -- -- Home (comment)  peddle machine   Frequency -- -- -- -- Add 2 additional days to program exercise sessions.   Initial Home Exercises Provided -- -- -- -- 04/16/23     Oxygen   Maintain Oxygen Saturation -- 88% or higher 88% or higher 88% or higher 88% or higher            Exercise Comments:   Exercise Comments     Row Name 02/19/23 1146 04/16/23 1155         Exercise Comments First full day of exercise!  Patient was oriented to gym and equipment including functions, settings, policies, and procedures.  Patient's individual exercise prescription and treatment plan were reviewed.  All starting workloads were established based on the results of the 6 minute walk test done at initial orientation visit.  The plan for exercise progression was also introduced and progression will be customized based on patient's performance and goals.  Patient did decrease his 6 MWT post distance. He stated that he feels stronger with his heart but that his back his back problems were what was limiting him from walking further.               Exercise Goals and Review:   Exercise Goals     Row Name 02/14/23 1356             Exercise Goals   Increase Physical Activity Yes       Intervention Provide advice, education, support and counseling about physical activity/exercise needs.;Develop an individualized exercise prescription for aerobic and resistive training based on initial evaluation findings, risk stratification, comorbidities and participant's personal goals.       Expected Outcomes Short Term: Attend rehab on a regular basis to increase amount of physical activity.;Long Term: Add in home exercise to make exercise part of routine and to increase amount of physical activity.;Long Term: Exercising regularly at least 3-5 days a week.       Increase Strength and Stamina Yes       Intervention Provide advice, education, support and counseling about physical activity/exercise needs.;Develop an individualized exercise prescription for aerobic and resistive training based on initial evaluation findings, risk stratification, comorbidities and participant's personal goals.       Expected Outcomes Short Term: Increase workloads from initial exercise prescription for resistance, speed, and METs.;Short Term: Perform resistance training exercises routinely during rehab and add in resistance training at home;Long Term: Improve cardiorespiratory fitness, muscular endurance and strength as measured by increased METs and functional capacity ( )       Able to understand and use rate of perceived exertion (RPE) scale Yes       Intervention Provide education and explanation on how to use RPE scale       Expected Outcomes Short Term: Able to use RPE  daily in rehab to express subjective intensity level;Long Term:  Able to use RPE to guide intensity  level when exercising independently       Able to understand and use Dyspnea scale Yes       Intervention Provide education and explanation on how to use Dyspnea scale       Expected Outcomes Short Term: Able to use Dyspnea scale daily in rehab to express subjective sense of shortness of breath during exertion;Long Term: Able to use Dyspnea scale to guide intensity level when exercising independently       Knowledge and understanding of Target Heart Rate Range (THRR) Yes       Intervention Provide education and explanation of THRR including how the numbers were predicted and where they are located for reference       Expected Outcomes Short Term: Able to state/look up THRR;Long Term: Able to use THRR to govern intensity when exercising independently;Short Term: Able to use daily as guideline for intensity in rehab       Able to check pulse independently Yes       Intervention Provide education and demonstration on how to check pulse in carotid and radial arteries.;Review the importance of being able to check your own pulse for safety during independent exercise       Expected Outcomes Short Term: Able to explain why pulse checking is important during independent exercise;Long Term: Able to check pulse independently and accurately       Understanding of Exercise Prescription Yes       Intervention Provide education, explanation, and written materials on patient's individual exercise prescription       Expected Outcomes Short Term: Able to explain program exercise prescription;Long Term: Able to explain home exercise prescription to exercise independently                Exercise Goals Re-Evaluation :  Exercise Goals Re-Evaluation     Row Name 02/19/23 1146 03/06/23 0746 03/21/23 1103 04/03/23 0748 04/16/23 1152     Exercise Goal Re-Evaluation   Exercise Goals Review Able to understand and use rate of perceived exertion (RPE) scale;Able to understand and use Dyspnea scale;Knowledge and  understanding of Target Heart Rate Range (THRR);Understanding of Exercise Prescription Increase Physical Activity;Increase Strength and Stamina;Understanding of Exercise Prescription Increase Physical Activity;Increase Strength and Stamina;Understanding of Exercise Prescription Increase Physical Activity;Increase Strength and Stamina;Understanding of Exercise Prescription Increase Physical Activity;Increase Strength and Stamina;Able to understand and use rate of perceived exertion (RPE) scale;Able to understand and use Dyspnea scale;Knowledge and understanding of Target Heart Rate Range (THRR);Able to check pulse independently;Understanding of Exercise Prescription   Comments Reviewed RPE and dyspnea scale, THR and program prescription with pt today.  Pt voiced understanding and was given a copy of goals to take home. Raiford Noble is off to a good start in the program. He recently completed his first 2 weeks of exercise. During his first few sessions he was able to increase his level on the T4 nustep to level 4, and increase his level on the recumbent bike to level 4. We will continue to monitor his progress in the program. Raiford Noble is doing well in the program. He continues to work at level 2 on the recumbent bike, T4 nustep, T5 nustep and level 1 on the biostep. He has not done any walking in the program since the last review, but has done well with 3 lb hand weights for resistance training. We will continue to monitor his progress in the program.  Raiford Noble continues to do well in the program. He has recently been able to increase his level on the biostep from level 1 to 2. He has also been able to maintain an intensity of level 2 on the recumbent bike and T4 nustep. We will continue to monitor his progress in the program. Reviewed home exercise with pt today.  Pt plans to use peddle machine for exercise.  Reviewed THR, pulse, RPE, sign and symptoms, pulse oximetery and when to call 911 or MD.  Also discussed weather  considerations and indoor options.  Pt voiced understanding.Patient did decrease his 6 MWT post distance. He stated that he feels stronger with his heart but that his back his back problems were what was limiting him from walking further.   Expected Outcomes Short: Use RPE daily to regulate intensity. Long: Follow program prescription in THR. Short: Continue to follow current exercise prescription. Long: Continue exercise to improve strength and stamina. Short: Begin walking the track as part of exercise prescription. Long: Continue exercise to improve strength and stamina. Short: Begin walking on the track, increase level on the T5 nustep to level 2. Long: Continue exercise to improve strength and stamina Short: add 1-2 days a week of home exericise on off days of rehab. Graduate from cardiac rehab. Long: continue with independent exercise program upon graduation from cardiac rehab.            Discharge Exercise Prescription (Final Exercise Prescription Changes):  Exercise Prescription Changes - 04/16/23 1100       Response to Exercise   Duration Progress to 30 minutes of  aerobic without signs/symptoms of physical distress    Intensity THRR unchanged      Progression   Progression Continue to progress workloads to maintain intensity without signs/symptoms of physical distress.    Average METs 2.3      Resistance Training   Training Prescription Yes    Weight 3 lb    Reps 10-15      Interval Training   Interval Training No      Recumbant Bike   Level 2    Watts 18    Minutes 15      NuStep   Level 2    Minutes 15    METs 2.9      T5 Nustep   Level 1    Minutes 15    METs 1.9      Biostep-RELP   Level 2    Minutes 15    METs 2      Home Exercise Plan   Plans to continue exercise at Home (comment)   peddle machine   Frequency Add 2 additional days to program exercise sessions.    Initial Home Exercises Provided 04/16/23      Oxygen   Maintain Oxygen Saturation  88% or higher             Nutrition:  Target Goals: Understanding of nutrition guidelines, daily intake of sodium 1500mg , cholesterol 200mg , calories 30% from fat and 7% or less from saturated fats, daily to have 5 or more servings of fruits and vegetables.  Education: All About Nutrition: -Group instruction provided by verbal, written material, interactive activities, discussions, models, and posters to present general guidelines for heart healthy nutrition including fat, fiber, MyPlate, the role of sodium in heart healthy nutrition, utilization of the nutrition label, and utilization of this knowledge for meal planning. Follow up email sent as well. Written material given at graduation. Flowsheet Row Cardiac  Rehab from 04/11/2023 in [facility] Cardiac and Pulmonary Rehab  Date 04/11/23  Educator JG  Instruction Review Code 1- Verbalizes Understanding       Biometrics:  Pre Biometrics - 02/14/23 1357       Pre Biometrics   Height 6' (1.829 m)    Weight 249 lb 12.8 oz (113.3 kg)    Waist Circumference 52 inches    Hip Circumference 48 inches    Waist to Hip Ratio 1.08 %    BMI (Calculated) 33.87    Single Leg Stand 7.03 seconds             Post Biometrics - 04/16/23 1130        Post  Biometrics   Height 6' (1.829 m)    Weight 247 lb 1.6 oz (112.1 kg)    Waist Circumference 51.5 inches    Hip Circumference 47.5 inches    Waist to Hip Ratio 1.08 %    BMI (Calculated) 33.51    Single Leg Stand 3.62 seconds             Nutrition Therapy Plan and Nutrition Goals:  Nutrition Therapy & Goals - 02/14/23 1335       Nutrition Therapy   Diet Cardiac, Low Na    Protein (specify units) 90    Fiber 30 grams    Whole Grain Foods 3 servings    Saturated Fats 15 max. grams    Fruits and Vegetables 5 servings/day    Sodium 2 grams      Personal Nutrition Goals   Nutrition Goal Drink 3 bottles of water daily    Personal Goal #2 Use healthy fats to promote hearth  health, but watch calories    Personal Goal #3 Pair carb with protein at meal    Comments Patient drinking 2 bottles of water, ~32oz daily. Knows he needs to drink more, discussed goal of 3 bottles per day. He reports he tries to eat 3 meals per day. Sometimes eats late in the evening. Educated on [employer] handout, types of fats, sources, and how to read label. His nutrition knowledge was limited but kept things simple and used nutritional food groups sheet as reference to help him build more balanced meals with foods he likes and will focus on smaller portions of meat, leaner meat choices, more colorful produce and complex carbs paired with good protein or healthy fats. Answered questions he had around, saturated fat, sodium, fiber and cholesterol.      Intervention Plan   Intervention Prescribe, educate and counsel regarding individualized specific dietary modifications aiming towards targeted core components such as weight, hypertension, lipid management, diabetes, heart failure and other comorbidities.;Nutrition handout(s) given to patient.    Expected Outcomes Short Term Goal: Understand basic principles of dietary content, such as calories, fat, sodium, cholesterol and nutrients.;Short Term Goal: A plan has been developed with personal nutrition goals set during dietitian appointment.;Long Term Goal: Adherence to prescribed nutrition plan.             Nutrition Assessments:  MEDIFICTS Score Key: >=70 Need to make dietary changes  40-70 Heart Healthy Diet <= 40 Therapeutic Level Cholesterol Diet  Flowsheet Row Cardiac Rehab from 02/14/2023 in [facility] Cardiac and Pulmonary Rehab  Picture Your Plate Total Score on Admission 45      Picture Your Plate Scores: <62 Unhealthy dietary pattern with much room for improvement. 41-50 Dietary pattern unlikely to meet recommendations for good health and room for improvement. 51-60 More healthful  dietary pattern, with some room for  improvement.  >60 Healthy dietary pattern, although there may be some specific behaviors that could be improved.    Nutrition Goals Re-Evaluation:   Nutrition Goals Discharge (Final Nutrition Goals Re-Evaluation):   Psychosocial: Target Goals: Acknowledge presence or absence of significant depression and/or stress, maximize coping skills, provide positive support system. Participant is able to verbalize types and ability to use techniques and skills needed for reducing stress and depression.   Education: Stress, Anxiety, and Depression - Group verbal and visual presentation to define topics covered.  Reviews how body is impacted by stress, anxiety, and depression.  Also discusses healthy ways to reduce stress and to treat/manage anxiety and depression.  Written material given at graduation. Flowsheet Row Cardiac Rehab from 04/11/2023 in [facility] Cardiac and Pulmonary Rehab  Date 03/07/23  Educator SB  Instruction Review Code 1- [employer] Understanding       Education: Sleep Hygiene -Provides group verbal and written instruction about how sleep can affect your health.  Define sleep hygiene, discuss sleep cycles and impact of sleep habits. Review good sleep hygiene tips.    Initial Review & Psychosocial Screening:  Initial Psych Review & Screening - 02/05/23 1028       Initial Review   Current issues with Current Anxiety/Panic      Family Dynamics   Good Support System? Yes    Comments Raiford Noble has intra family strains and can get depressed over his daughter that is off of drugs now but doesnt get to see his grandkids. He has his wife, daughter and good friend to look to for support.      Barriers   Psychosocial barriers to participate in program The patient should benefit from training in stress management and relaxation.;There are no identifiable barriers or psychosocial needs.      Screening Interventions   Interventions To provide support and resources with identified  psychosocial needs;Provide feedback about the scores to participant;Encouraged to exercise    Expected Outcomes Short Term goal: Utilizing psychosocial counselor, staff and physician to assist with identification of specific Stressors or current issues interfering with healing process. Setting desired goal for each stressor or current issue identified.;Long Term Goal: Stressors or current issues are controlled or eliminated.;Short Term goal: Identification and review with participant of any Quality of Life or Depression concerns found by scoring the questionnaire.;Long Term goal: The participant improves quality of Life and PHQ9 Scores as seen by post scores and/or verbalization of changes             Quality of Life Scores:   Quality of Life - 02/14/23 1359       Quality of Life   Select Quality of Life      Quality of Life Scores   Health/Function Pre 15.5 %    Socioeconomic Pre 24.29 %    Psych/Spiritual Pre 24.64 %    Family Pre 25.2 %    GLOBAL Pre 20.62 %            Scores of 19 and below usually indicate a poorer quality of life in these areas.  A difference of  2-3 points is a clinically meaningful difference.  A difference of 2-3 points in the total score of the Quality of Life Index has been associated with significant improvement in overall quality of life, self-image, physical symptoms, and general health in studies assessing change in quality of life.  PHQ-9: Review Flowsheet       02/14/2023 07/02/2017  Depression screen PHQ 2/9  Decreased Interest 1 0  Down, Depressed, Hopeless 0 0  PHQ - 2 Score 1 0  Altered sleeping 1 -  Tired, decreased energy 1 -  Change in appetite 0 -  Feeling bad or failure about yourself  1 -  Trouble concentrating 0 -  Moving slowly or fidgety/restless 0 -  Suicidal thoughts 0 -  PHQ-9 Score 4 -  Difficult doing work/chores Somewhat difficult -    Details           Interpretation of Total Score  Total Score Depression  Severity:  1-4 = Minimal depression, 5-9 = Mild depression, 10-14 = Moderate depression, 15-19 = Moderately severe depression, 20-27 = Severe depression   Psychosocial Evaluation and Intervention:  Psychosocial Evaluation - 02/05/23 1032       Psychosocial Evaluation & Interventions   Interventions Encouraged to exercise with the program and follow exercise prescription;Stress management education;Relaxation education    Comments Raiford Noble has intra family strains and can get depressed over his daughter that is off of drugs now but doesnt get to see his grandkids. He has his wife, daughter and good friend to look to for support.    Expected Outcomes Short: Start [facility] to help with mood. Long: Maintain a healthy mental state    Continue Psychosocial Services  Follow up required by staff             Psychosocial Re-Evaluation:  Psychosocial Re-Evaluation     Row Name 03/26/23 1130             Psychosocial Re-Evaluation   Current issues with Current Stress Concerns;Current Sleep Concerns       Comments Raiford Noble is enjoying coming to cardiac rehab, however it does cause some anxiety the nights before making it hard to sleep. He knows he is getitng older and is scared something will happen while he is exercising. He hasn't had chest pain like he did prior to his MI, but sometimes he gets these episodes of chest discomfort. He plans to discuss these concerns with his doctor. He felt reassured when staff went over his progress in the short time he has been here and he feels motivated to continue coming to see more progress. His wife runs an after school care program, so he doesn't do too much when he is by himself. He enjoys watching football, but feels like he doesn't do too much outside of that. He wants to find something more active to do so he is glad he is involved in the program. He wants to meet with his doctor to discuss his health concerns and is making a list of questions for him.        Expected Outcomes Short: attend cardiac rehab for education and exercise. Long; find and maintain positive self care hobbies       Continue Psychosocial Services  Follow up required by staff                Psychosocial Discharge (Final Psychosocial Re-Evaluation):  Psychosocial Re-Evaluation - 03/26/23 1130       Psychosocial Re-Evaluation   Current issues with Current Stress Concerns;Current Sleep Concerns    Comments Raiford Noble is enjoying coming to cardiac rehab, however it does cause some anxiety the nights before making it hard to sleep. He knows he is getitng older and is scared something will happen while he is exercising. He hasn't had chest pain like he did prior to his MI, but sometimes he gets  these episodes of chest discomfort. He plans to discuss these concerns with his doctor. He felt reassured when staff went over his progress in the short time he has been here and he feels motivated to continue coming to see more progress. His wife runs an after school care program, so he doesn't do too much when he is by himself. He enjoys watching football, but feels like he doesn't do too much outside of that. He wants to find something more active to do so he is glad he is involved in the program. He wants to meet with his doctor to discuss his health concerns and is making a list of questions for him.    Expected Outcomes Short: attend cardiac rehab for education and exercise. Long; find and maintain positive self care hobbies    Continue Psychosocial Services  Follow up required by staff             Vocational Rehabilitation: Provide vocational rehab assistance to qualifying candidates.   Vocational Rehab Evaluation & Intervention:  Vocational Rehab - 03/26/23 1114       Initial Vocational Rehab Evaluation & Intervention   Assessment shows need for Vocational Rehabilitation No             Education: Education Goals: Education classes will be provided on a variety of topics  geared toward better understanding of heart health and risk factor modification. Participant will state understanding/return demonstration of topics presented as noted by education test scores.  Learning Barriers/Preferences:  Learning Barriers/Preferences - 02/05/23 1028       Learning Barriers/Preferences   Learning Barriers None    Learning Preferences None             General Cardiac Education Topics:  AED/CPR: - Group verbal and written instruction with the use of models to demonstrate the basic use of the AED with the basic ABC's of resuscitation.   Anatomy and Cardiac Procedures: - Group verbal and visual presentation and models provide information about basic cardiac anatomy and function. Reviews the testing methods done to diagnose heart disease and the outcomes of the test results. Describes the treatment choices: Medical Management, Angioplasty, or Coronary Bypass Surgery for treating various heart conditions including Myocardial Infarction, Angina, Valve Disease, and Cardiac Arrhythmias.  Written material given at graduation. Flowsheet Row Cardiac Rehab from 04/11/2023 in [facility] Cardiac and Pulmonary Rehab  Education need identified 02/14/23       Medication Safety: - Group verbal and visual instruction to review commonly prescribed medications for heart and lung disease. Reviews the medication, class of the drug, and side effects. Includes the steps to properly store meds and maintain the prescription regimen.  Written material given at graduation.   Intimacy: - Group verbal instruction through game format to discuss how heart and lung disease can affect sexual intimacy. Written material given at graduation..   Know Your Numbers and Heart Failure: - Group verbal and visual instruction to discuss disease risk factors for cardiac and pulmonary disease and treatment options.  Reviews associated critical values for Overweight/Obesity, Hypertension, Cholesterol, and  Diabetes.  Discusses basics of heart failure: signs/symptoms and treatments.  Introduces Heart Failure Zone chart for action plan for heart failure.  Written material given at graduation. Flowsheet Row Cardiac Rehab from 04/11/2023 in [facility] Cardiac and Pulmonary Rehab  Education need identified 02/14/23  Date 02/28/23  Educator SB  Instruction Review Code 1- Verbalizes Understanding       Infection Prevention: - Provides verbal and written material  to individual with discussion of infection control including proper hand washing and proper equipment cleaning during exercise session. Flowsheet Row Cardiac Rehab from 04/11/2023 in [facility] Cardiac and Pulmonary Rehab  Date 02/14/23  Educator [facility]  Instruction Review Code 1- Verbalizes Understanding       Falls Prevention: - Provides verbal and written material to individual with discussion of falls prevention and safety. Flowsheet Row Cardiac Rehab from 04/11/2023 in [facility] Cardiac and Pulmonary Rehab  Date 02/14/23  Educator [facility]  Instruction Review Code 1- Verbalizes Understanding       Other: -Provides group and verbal instruction on various topics (see comments)   Knowledge Questionnaire Score:  Knowledge Questionnaire Score - 02/14/23 1358       Knowledge Questionnaire Score   Pre Score 21/26             Core Components/Risk Factors/Patient Goals at Admission:  Personal Goals and Risk Factors at Admission - 02/14/23 1359       Core Components/Risk Factors/Patient Goals on Admission    Weight Management Yes;Weight Loss    Intervention Weight Management: Develop a combined nutrition and exercise program designed to reach desired caloric intake, while maintaining appropriate intake of nutrient and fiber, sodium and fats, and appropriate energy expenditure required for the weight goal.;Weight Management: Provide education and appropriate resources to help participant work on and attain dietary goals.;Weight Management/Obesity:  Establish reasonable short term and long term weight goals.;Obesity: Provide education and appropriate resources to help participant work on and attain dietary goals.    Admit Weight 249 lb 12.8 oz (113.3 kg)    Goal Weight: Short Term 240 lb (108.9 kg)    Goal Weight: Long Term 185 lb (83.9 kg)    Expected Outcomes Short Term: Continue to assess and modify interventions until short term weight is achieved;Long Term: Adherence to nutrition and physical activity/exercise program aimed toward attainment of established weight goal;Understanding recommendations for meals to include 15-35% energy as protein, 25-35% energy from fat, 35-60% energy from carbohydrates, less than 200mg  of dietary cholesterol, 20-35 gm of total fiber daily;Understanding of distribution of calorie intake throughout the day with the consumption of 4-5 meals/snacks;Weight Loss: Understanding of general recommendations for a balanced deficit meal plan, which promotes 1-2 lb weight loss per week and includes a negative energy balance of [phone] kcal/d    Heart Failure Yes    Intervention Provide a combined exercise and nutrition program that is supplemented with education, support and counseling about heart failure. Directed toward relieving symptoms such as shortness of breath, decreased exercise tolerance, and extremity edema.    Expected Outcomes Improve functional capacity of life;Short term: Attendance in program 2-3 days a week with increased exercise capacity. Reported lower sodium intake. Reported increased fruit and vegetable intake. Reports medication compliance.;Short term: Daily weights obtained and reported for increase. Utilizing diuretic protocols set by physician.;Long term: Adoption of self-care skills and reduction of barriers for early signs and symptoms recognition and intervention leading to self-care maintenance.    Hypertension Yes    Intervention Provide education on lifestyle modifcations including regular  physical activity/exercise, weight management, moderate sodium restriction and increased consumption of fresh fruit, vegetables, and low fat dairy, alcohol moderation, and smoking cessation.;Monitor prescription use compliance.    Expected Outcomes Short Term: Continued assessment and intervention until BP is < 140/41mm HG in hypertensive participants. < 130/64mm HG in hypertensive participants with diabetes, heart failure or chronic kidney disease.;Long Term: Maintenance of blood pressure at goal levels.  Lipids Yes    Intervention Provide education and support for participant on nutrition & aerobic/resistive exercise along with prescribed medications to achieve LDL 70mg , HDL >40mg .    Expected Outcomes Short Term: Participant states understanding of desired cholesterol values and is compliant with medications prescribed. Participant is following exercise prescription and nutrition guidelines.;Long Term: Cholesterol controlled with medications as prescribed, with individualized exercise RX and with personalized nutrition plan. Value goals: LDL < 70mg , HDL > 40 mg.             Education:Diabetes - Individual verbal and written instruction to review signs/symptoms of diabetes, desired ranges of glucose level fasting, after meals and with exercise. Acknowledge that pre and post exercise glucose checks will be done for 3 sessions at entry of program.   Core Components/Risk Factors/Patient Goals Review:   Goals and Risk Factor Review     Row Name 03/26/23 1111             Core Components/Risk Factors/Patient Goals Review   Personal Goals Review Weight Management/Obesity;Heart Failure       Review Raiford Noble has enjoyed the program so far. He has been staying at the same weight for 2 months. One doctor mentioned some fluid in his ankles, so he is making sure he is taking his lasix approrpirately. He sees his cardiologist in a few weeks and plans to ask him questions related to heart health. He had  gained weight after taking prednisone as part of his cancer treatment, so he feels like he is trying to fight a losing battle. He has met with the dietician and is trying to implement those things with his wife's help.       Expected Outcomes Short: attend cardiac rehab for education and exercise. Long: Independently manage risk factors                Core Components/Risk Factors/Patient Goals at Discharge (Final Review):   Goals and Risk Factor Review - 03/26/23 1111       Core Components/Risk Factors/Patient Goals Review   Personal Goals Review Weight Management/Obesity;Heart Failure    Review Raiford Noble has enjoyed the program so far. He has been staying at the same weight for 2 months. One doctor mentioned some fluid in his ankles, so he is making sure he is taking his lasix approrpirately. He sees his cardiologist in a few weeks and plans to ask him questions related to heart health. He had gained weight after taking prednisone as part of his cancer treatment, so he feels like he is trying to fight a losing battle. He has met with the dietician and is trying to implement those things with his wife's help.    Expected Outcomes Short: attend cardiac rehab for education and exercise. Long: Independently manage risk factors             ITP Comments:  ITP Comments     Row Name 02/05/23 1026 02/14/23 1348 02/19/23 1146 02/28/23 1223 03/28/23 1314   ITP Comments Virtual Visit completed. Patient informed on EP and RD appointment and 6 Minute walk test. Patient also informed of patient health questionnaires on My Chart. Patient Verbalizes understanding. Visit diagnosis can be found in [facility] 12/27/2022. Completed and gym orientation. Initial ITP created and sent for review to Dr. Bethann Punches, Medical Director. First full day of exercise!  Patient was oriented to gym and equipment including functions, settings, policies, and procedures.  Patient's individual exercise prescription and treatment plan  were reviewed.  All  starting workloads were established based on the results of the 6 minute walk test done at initial orientation visit.  The plan for exercise progression was also introduced and progression will be customized based on patient's performance and goals. 30 Day review completed. Medical Director ITP review done, changes made as directed, and signed approval by Medical Director.    new to program 30 Day review completed. Medical Director ITP review done, changes made as directed, and signed approval by Medical Director.    Row Name 04/18/23 1014           ITP Comments 30 Day review completed. Medical Director ITP review done, changes made as directed, and signed approval by Medical Director.                Comments:

## 2023-04-19 ENCOUNTER — Ambulatory Visit
Admission: RE | Admit: 2023-04-19 | Discharge: 2023-04-19 | Disposition: A | Discharge status: Home / Self Care | Payer: HMO | Source: Ambulatory Visit | Attending: Urology | Admitting: Urology

## 2023-04-19 DIAGNOSIS — N401 Enlarged prostate with lower urinary tract symptoms: Secondary | ICD-10-CM | POA: Insufficient documentation

## 2023-04-19 DIAGNOSIS — N261 Atrophy of kidney (terminal): Secondary | ICD-10-CM | POA: Insufficient documentation

## 2023-04-19 DIAGNOSIS — Z9889 Other specified postprocedural states: Secondary | ICD-10-CM | POA: Diagnosis not present

## 2023-04-19 DIAGNOSIS — N528 Other male erectile dysfunction: Secondary | ICD-10-CM | POA: Diagnosis not present

## 2023-04-19 DIAGNOSIS — R3914 Feeling of incomplete bladder emptying: Secondary | ICD-10-CM | POA: Insufficient documentation

## 2023-04-20 ENCOUNTER — Encounter: Payer: HMO | Admitting: *Deleted

## 2023-04-20 DIAGNOSIS — Z955 Presence of coronary angioplasty implant and graft: Secondary | ICD-10-CM

## 2023-04-20 DIAGNOSIS — I214 Non-ST elevation (NSTEMI) myocardial infarction: Secondary | ICD-10-CM

## 2023-04-20 NOTE — Progress Notes (Signed)
Daily Session Note  Patient Details  Name: Marc Gray MRN: [id number] Date of Birth: [birth year] Referring Provider:   Flowsheet Row Cardiac Rehab from 02/14/2023 in [facility] Cardiac and Pulmonary Rehab  Referring Provider Dr. Marcina Millard       Encounter Date: 04/20/2023  Check In:  Session Check In - 04/20/23 1126       Check-In   Supervising physician immediately available to respond to emergencies See telemetry face sheet for immediately available ER MD    Location [facility]-Cardiac & Pulmonary Rehab    Staff Present Cora Collum, RN, BSN, CCRP;Joseph Hood, RCP,RRT,BSRT;Noah Tickle, [place], Exercise Physiologist    Virtual Visit No    Medication changes reported     No    Fall or balance concerns reported    No    Warm-up and Cool-down Performed on first and last piece of equipment    Resistance Training Performed Yes    VAD Patient? No    PAD/SET Patient? No      Pain Assessment   Currently in Pain? No/denies                Social History   Tobacco Use  Smoking Status Former   Current packs/day: 0.00   Average packs/day: 1 pack/day for 53.0 years (53.0 ttl pk-yrs)   Types: Cigarettes   Start date: 04/14/1964   Quit date: 04/14/2017   Years since quitting: 6.0   Passive exposure: Past  Smokeless Tobacco Never  Tobacco Comments   Quit November 2018    Goals Met:  Independence with exercise equipment Exercise tolerated well No report of concerns or symptoms today  Goals Unmet:  Not Applicable  Comments: Pt able to follow exercise prescription today without complaint.  Will continue to monitor for progression.    Dr. Bethann Punches is Medical Director for [facility] Cardiac Rehabilitation.  Dr. Vida Rigger is Medical Director for [facility] Pulmonary Rehabilitation.

## 2023-04-22 DIAGNOSIS — G4733 Obstructive sleep apnea (adult) (pediatric): Secondary | ICD-10-CM | POA: Diagnosis not present

## 2023-04-23 ENCOUNTER — Encounter: Payer: HMO | Admitting: *Deleted

## 2023-04-23 ENCOUNTER — Encounter: Payer: Self-pay | Admitting: *Deleted

## 2023-04-23 DIAGNOSIS — I214 Non-ST elevation (NSTEMI) myocardial infarction: Secondary | ICD-10-CM

## 2023-04-23 DIAGNOSIS — Z7902 Long term (current) use of antithrombotics/antiplatelets: Secondary | ICD-10-CM | POA: Diagnosis not present

## 2023-04-23 DIAGNOSIS — Z955 Presence of coronary angioplasty implant and graft: Secondary | ICD-10-CM | POA: Diagnosis not present

## 2023-04-23 DIAGNOSIS — D500 Iron deficiency anemia secondary to blood loss (chronic): Secondary | ICD-10-CM | POA: Diagnosis not present

## 2023-04-23 DIAGNOSIS — Z860101 Personal history of adenomatous and serrated colon polyps: Secondary | ICD-10-CM | POA: Diagnosis not present

## 2023-04-23 NOTE — Progress Notes (Signed)
Daily Session Note  Patient Details  Name: Mulford Conly MRN: [id number] Date of Birth: April 01, 1946 Referring Provider:   Flowsheet Row Cardiac Rehab from 02/14/2023 in [facility] Cardiac and Pulmonary Rehab  Referring Provider Dr. Marcina Millard       Encounter Date: 04/23/2023  Check In:  Session Check In - 04/23/23 1129       Check-In   Supervising physician immediately available to respond to emergencies See telemetry face sheet for immediately available ER MD    Location [facility]-Cardiac & Pulmonary Rehab    Staff Present Cora Collum, RN, BSN, CCRP;Margaret Best, MS, Exercise Physiologist;Meredith Jewel Baize, RN Atilano Median, RN, ADN    Virtual Visit No    Medication changes reported     No    Fall or balance concerns reported    No    Warm-up and Cool-down Performed on first and last piece of equipment    Resistance Training Performed Yes    VAD Patient? No    PAD/SET Patient? No      Pain Assessment   Currently in Pain? No/denies                Social History   Tobacco Use  Smoking Status Former   Current packs/day: 0.00   Average packs/day: 1 pack/day for 53.0 years (53.0 ttl pk-yrs)   Types: Cigarettes   Start date: 04/14/1964   Quit date: 04/14/2017   Years since quitting: 6.0   Passive exposure: Past  Smokeless Tobacco Never  Tobacco Comments   Quit November 2018    Goals Met:  Independence with exercise equipment Exercise tolerated well No report of concerns or symptoms today Strength training completed today  Goals Unmet:  Not Applicable  Comments: Pt able to follow exercise prescription today without complaint.  Will continue to monitor for progression.    Dr. Bethann Punches is Medical Director for [facility] Cardiac Rehabilitation.  Dr. Vida Rigger is Medical Director for [facility] Pulmonary Rehabilitation.

## 2023-04-23 NOTE — Progress Notes (Signed)
Cardiac Individual Treatment Plan  Patient Details  Name: Minton Ravenscroft MRN: [id number] Date of Birth: [birth year] Referring Provider:   Flowsheet Row Cardiac Rehab from 02/14/2023 in [facility] Cardiac and Pulmonary Rehab  Referring Provider Dr. Marcina Millard       Initial Encounter Date:  Flowsheet Row Cardiac Rehab from 02/14/2023 in [facility] Cardiac and Pulmonary Rehab  Date 02/14/23       Visit Diagnosis: Status post coronary artery stent placement  NSTEMI (non-ST elevated myocardial infarction) [facility])  Patient's Home Medications on Admission:  Current Outpatient Medications:    acetaminophen (TYLENOL) 500 MG tablet, Take 1,000 mg by mouth every 6 (six) hours as needed for moderate pain., Disp: , Rfl:    aspirin EC 81 MG tablet, Take 1 tablet (81 mg total) by mouth daily. Swallow whole., Disp: 90 tablet, Rfl: 0   Azelastine HCl 137 MCG/SPRAY SOLN, SMARTSIG:1-2 Spray(s) Both Nares Twice Daily, Disp: , Rfl:    BREZTRI AEROSPHERE 160-9-4.8 MCG/ACT AERO, Inhale 2 puffs into the lungs 2 (two) times daily., Disp: , Rfl:    calcium-vitamin D (OSCAL WITH D) 500-200 MG-UNIT tablet, Take 1 tablet by mouth daily., Disp: 90 tablet, Rfl: 1   diphenhydrAMINE-zinc acetate (BENADRYL EXTRA STRENGTH) cream, Apply 1 application topically 3 (three) times daily as needed for itching., Disp: 28 g, Rfl: 0   DUPIXENT 300 MG/2ML SOPN, Inject 300 mg into the skin every 14 (fourteen) days., Disp: , Rfl:    EPINEPHrine 0.3 mg/0.3 mL IJ SOAJ injection, Inject 0.3 mg into the muscle as needed., Disp: , Rfl:    fluticasone (FLONASE) 50 MCG/ACT nasal spray, Place 1 spray into both nostrils daily., Disp: 16 g, Rfl: 2   furosemide (LASIX) 20 MG tablet, Take by mouth., Disp: , Rfl:    Ipratropium-Albuterol (COMBIVENT) 20-100 MCG/ACT AERS respimat, Inhale into the lungs., Disp: , Rfl:    levothyroxine (SYNTHROID) 137 MCG tablet, Take by mouth., Disp: , Rfl:    Melatonin 10 MG TABS, Take 10 mg by mouth at  bedtime. , Disp: , Rfl:    metoprolol tartrate (LOPRESSOR) 25 MG tablet, Take 0.5 tablets (12.5 mg total) by mouth 2 (two) times daily., Disp: 30 tablet, Rfl: 2   montelukast (SINGULAIR) 10 MG tablet, Take 10 mg by mouth daily., Disp: , Rfl:    Multiple Vitamin (MULTI-VITAMINS) TABS, Take 1 tablet by mouth daily. , Disp: , Rfl:    pantoprazole (PROTONIX) 40 MG tablet, Take 1 tablet (40 mg total) by mouth daily., Disp: 90 tablet, Rfl: 0   polyethylene glycol-electrolytes (NULYTELY) 420 g solution, MIX AND DRINK AS DIRECTED, Disp: , Rfl:    prasugrel (EFFIENT) 10 MG TABS tablet, Take 1 tablet (10 mg total) by mouth daily., Disp: 30 tablet, Rfl: 11   predniSONE (DELTASONE) 5 MG tablet, Take 5 mg by mouth daily., Disp: , Rfl:    prochlorperazine (COMPAZINE) 10 MG tablet, Take 1 tablet (10 mg total) by mouth every 6 (six) hours as needed for nausea or vomiting., Disp: 60 tablet, Rfl: 2   rosuvastatin (CRESTOR) 10 MG tablet, , Disp: , Rfl:    SHINGRIX injection, , Disp: , Rfl:    SPIKEVAX syringe, , Disp: , Rfl:    tadalafil (CIALIS) 5 MG tablet, Take 5-10mg  by mouth as needed prior to intercourse, Disp: 30 tablet, Rfl: 11   tamsulosin (FLOMAX) 0.4 MG CAPS capsule, Take 1 capsule (0.4 mg total) by mouth daily., Disp: 90 capsule, Rfl: 3   traZODone (DESYREL) 100 MG tablet,  Take 100 mg by mouth at bedtime., Disp: , Rfl:    triamcinolone ointment (KENALOG) 0.5 %, Apply 1 application topically 3 (three) times daily as needed., Disp: 30 g, Rfl: 1 No current facility-administered medications for this visit.  Facility-Administered Medications Ordered in Other Visits:    sodium chloride flush (NS) 0.9 % injection 10 mL, 10 mL, Intravenous, PRN, Rickard Patience, MD, 10 mL at 04/03/17 5284  Past Medical History: Past Medical History:  Diagnosis Date   Anxiety    Aortic atherosclerosis (HCC)    Arthritis    SHOULDER   BPH (benign prostatic hyperplasia)    Cataract    Chronic kidney disease    low kidney  function   Colon adenomas    Colon cancer (HCC) 2015   Pt states during his colonoscopy he had cancerous polyps removed.    COPD (chronic obstructive pulmonary disease) (HCC)    Depression    SINCE DIAGNOSIS   Dyspnea    DOE   Enlarged prostate    GERD (gastroesophageal reflux disease)    Hypertension    Hypothyroidism    Lung cancer (HCC)    Aug 2018   Pain    DUE TO LUNG ISSUE    Tobacco Use: Social History   Tobacco Use  Smoking Status Former   Current packs/day: 0.00   Average packs/day: 1 pack/day for 53.0 years (53.0 ttl pk-yrs)   Types: Cigarettes   Start date: 04/14/1964   Quit date: 04/14/2017   Years since quitting: 6.0   Passive exposure: Past  Smokeless Tobacco Never  Tobacco Comments   Quit November 2018    Labs: Review Flowsheet       Latest Ref Rng & Units 12/28/2022  Labs for ITP Cardiac and Pulmonary Rehab  Cholestrol 0 - 200 mg/dL 132   LDL (calc) 0 - 99 mg/dL 59   HDL-C >44 mg/dL 54   Trlycerides <010 mg/dL 74   Hemoglobin U7O 4.8 - 5.6 % 5.8     Details             Exercise Target Goals: Exercise Program Goal: Individual exercise prescription set using results from initial 6 min walk test and THRR while considering  patient's activity barriers and safety.   Exercise Prescription Goal: Initial exercise prescription builds to 30-45 minutes a day of aerobic activity, 2-3 days per week.  Home exercise guidelines will be given to patient during program as part of exercise prescription that the participant will acknowledge.   Education: Aerobic Exercise: - Group verbal and visual presentation on the components of exercise prescription. Introduces F.I.T.T principle from ACSM for exercise prescriptions.  Reviews F.I.T.T. principles of aerobic exercise including progression. Written material given at graduation. Flowsheet Row Cardiac Rehab from 04/11/2023 in [facility] Cardiac and Pulmonary Rehab  Education need identified 02/14/23        Education: Resistance Exercise: - Group verbal and visual presentation on the components of exercise prescription. Introduces F.I.T.T principle from ACSM for exercise prescriptions  Reviews F.I.T.T. principles of resistance exercise including progression. Written material given at graduation. Flowsheet Row Cardiac Rehab from 04/11/2023 in [facility] Cardiac and Pulmonary Rehab  Date 03/21/23  Educator NT  Instruction Review Code 1- Verbalizes Understanding        Education: Exercise & Equipment Safety: - Individual verbal instruction and demonstration of equipment use and safety with use of the equipment. Flowsheet Row Cardiac Rehab from 04/11/2023 in [facility] Cardiac and Pulmonary Rehab  Date 02/14/23  Educator [employer]  Instruction Review Code 1- Verbalizes Understanding       Education: Exercise Physiology & General Exercise Guidelines: - Group verbal and written instruction with models to review the exercise physiology of the cardiovascular system and associated critical values. Provides general exercise guidelines with specific guidelines to those with heart or lung disease.  Flowsheet Row Cardiac Rehab from 04/11/2023 in [facility] Cardiac and Pulmonary Rehab  Education need identified 02/14/23  Date 03/14/23  Educator NT  Instruction Review Code 1- [employer] Understanding       Education: Flexibility, Balance, Mind/Body Relaxation: - Group verbal and visual presentation with interactive activity on the components of exercise prescription. Introduces F.I.T.T principle from ACSM for exercise prescriptions. Reviews F.I.T.T. principles of flexibility and balance exercise training including progression. Also discusses the mind body connection.  Reviews various relaxation techniques to help reduce and manage stress (i.e. Deep breathing, progressive muscle relaxation, and visualization). Balance handout provided to take home. Written material given at graduation. Flowsheet Row Cardiac Rehab from  04/11/2023 in [facility] Cardiac and Pulmonary Rehab  Date 03/21/23  Educator NT  Instruction Review Code 1- Verbalizes Understanding       Activity Barriers & Risk Stratification:  Activity Barriers & Cardiac Risk Stratification - 02/14/23 1351       Activity Barriers & Cardiac Risk Stratification   Activity Barriers Arthritis;Balance Concerns;Back Problems    Cardiac Risk Stratification High             6 Minute Walk:  6 Minute Walk     Row Name 02/14/23 1350 04/16/23 1129       6 Minute Walk   Phase Initial Discharge    Distance 690 feet 570 feet    Distance % Change -- -17 %    Distance Feet Change -- -120 ft    Walk Time 6 minutes 5.25 minutes    # of Rest Breaks 0 1    MPH 1.31 1.23    METS 1.34 1.1    RPE 15 12    Perceived Dyspnea  3 3    VO2 Peak 4.68 3.85    Symptoms Yes (comment) Yes (comment)    Comments back pain while walking 8/10 relieved with rest. SOB 3 back pain while walking 8/10 relieved with rest. SOB 3    Resting HR 61 bpm 82 bpm    Resting BP 138/70 122/70    Resting Oxygen Saturation  96 % 94 %    Exercise Oxygen Saturation  during 6 min walk 97 % 92 %    Max Ex. HR 109 bpm 107 bpm    Max Ex. BP 142/80 138/62    2 Minute Post BP 130/78 128/82             Oxygen Initial Assessment:   Oxygen Re-Evaluation:   Oxygen Discharge (Final Oxygen Re-Evaluation):   Initial Exercise Prescription:  Initial Exercise Prescription - 02/14/23 1300       Date of Initial Exercise RX and Referring Provider   Date 02/14/23    Referring Provider Dr. Marcina Millard      Recumbant Bike   Level 1    RPM 50    Watts 15    Minutes 15      NuStep   Level 1    SPM 80    Minutes 15    METs 1.34      REL-XR   Level 1    Speed 50    Minutes 15    METs 1.34  Biostep-RELP   Level 1    SPM 50    Minutes 15    METs 1.34      Track   Laps 10    Minutes 15    METs 1.54      Prescription Details   Frequency (times per week) 3     Duration Progress to 30 minutes of continuous aerobic without signs/symptoms of physical distress      Intensity   THRR 40-80% of Max Heartrate 94-127    Ratings of Perceived Exertion 11-13    Perceived Dyspnea 0-4      Progression   Progression Continue to progress workloads to maintain intensity without signs/symptoms of physical distress.      Resistance Training   Training Prescription Yes    Weight 3    Reps 10-15             Perform Capillary Blood Glucose checks as needed.  Exercise Prescription Changes:   Exercise Prescription Changes     Row Name 02/14/23 1300 03/06/23 0700 03/21/23 1100 04/03/23 0700 04/16/23 1100     Response to Exercise   Blood Pressure (Admit) 138/70 118/62 116/58 128/58 --   Blood Pressure (Exercise) 142/80 142/68 128/64 132/54 --   Blood Pressure (Exit) 130/78 138/70 138/62 132/64 --   Heart Rate (Admit) 61 bpm 74 bpm 86 bpm 65 bpm --   Heart Rate (Exercise) 109 bpm 113 bpm 103 bpm 110 bpm --   Heart Rate (Exit) 66 bpm 72 bpm 93 bpm 76 bpm --   Oxygen Saturation (Admit) 96 % -- -- -- --   Oxygen Saturation (Exercise) 97 % -- -- -- --   Oxygen Saturation (Exit) 96 % -- -- -- --   Rating of Perceived Exertion (Exercise) 15 15 14 13  --   Perceived Dyspnea (Exercise) 3 0 -- 0 --   Symptoms back pain 8/10 while walking and SOB -- none none --   Comments 6 MWT results -- -- -- --   Duration -- Progress to 30 minutes of  aerobic without signs/symptoms of physical distress Progress to 30 minutes of  aerobic without signs/symptoms of physical distress Progress to 30 minutes of  aerobic without signs/symptoms of physical distress Progress to 30 minutes of  aerobic without signs/symptoms of physical distress   Intensity -- THRR unchanged THRR unchanged THRR unchanged THRR unchanged     Progression   Progression -- Continue to progress workloads to maintain intensity without signs/symptoms of physical distress. Continue to progress workloads to  maintain intensity without signs/symptoms of physical distress. Continue to progress workloads to maintain intensity without signs/symptoms of physical distress. Continue to progress workloads to maintain intensity without signs/symptoms of physical distress.   Average METs -- 2.5 2.4 2.3 2.3     Resistance Training   Training Prescription -- Yes Yes Yes Yes   Weight -- 3 lb 3 lb 3 lb 3 lb   Reps -- 10-15 10-15 10-15 10-15     Interval Training   Interval Training -- No No No No     Recumbant Bike   Level -- 4 2 2 2    Watts -- 15 18 18 18    Minutes -- 15 15 15 15      NuStep   Level -- 4 2 2 2    Minutes -- 15 15 15 15    METs -- 3.3 3.4 2.9 2.9     REL-XR   Level -- 2 -- -- --   Minutes --  15 -- -- --   METs -- 3.4 -- -- --     T5 Nustep   Level -- -- 2 1 1    Minutes -- -- 15 15 15    METs -- -- 2.1 1.9 1.9     Biostep-RELP   Level -- 1 1 2 2    Minutes -- 15 15 15 15    METs -- 2 2 2 2      Track   Laps -- 16 -- -- --   Minutes -- 15 -- -- --   METs -- 1.87 -- -- --     Home Exercise Plan   Plans to continue exercise at -- -- -- -- Home (comment)  peddle machine   Frequency -- -- -- -- Add 2 additional days to program exercise sessions.   Initial Home Exercises Provided -- -- -- -- 04/16/23     Oxygen   Maintain Oxygen Saturation -- 88% or higher 88% or higher 88% or higher 88% or higher    Row Name 04/18/23 1300             Response to Exercise   Blood Pressure (Admit) 110/60       Blood Pressure (Exit) 118/66       Heart Rate (Admit) 63 bpm       Heart Rate (Exercise) 98 bpm       Heart Rate (Exit) 72 bpm       Rating of Perceived Exertion (Exercise) 13       Perceived Dyspnea (Exercise) 0       Symptoms none       Duration Progress to 30 minutes of  aerobic without signs/symptoms of physical distress       Intensity THRR unchanged         Progression   Progression Continue to progress workloads to maintain intensity without signs/symptoms of physical  distress.       Average METs 2.5         Resistance Training   Training Prescription Yes       Weight 3 lb       Reps 10-15         Interval Training   Interval Training No         Recumbant Bike   Level 2       Watts 18       Minutes 15         NuStep   Level 2       Minutes 15       METs 3         T5 Nustep   Level 2       Minutes 15       METs 2.5         Home Exercise Plan   Plans to continue exercise at Home (comment)  peddle machine       Frequency Add 2 additional days to program exercise sessions.       Initial Home Exercises Provided 04/16/23         Oxygen   Maintain Oxygen Saturation 88% or higher                Exercise Comments:   Exercise Comments     Row Name 02/19/23 1146 04/16/23 1155         Exercise Comments First full day of exercise!  Patient was oriented to gym and equipment including functions, settings, policies, and procedures.  Patient's individual  exercise prescription and treatment plan were reviewed.  All starting workloads were established based on the results of the 6 minute walk test done at initial orientation visit.  The plan for exercise progression was also introduced and progression will be customized based on patient's performance and goals. Patient did decrease his 6 MWT post distance. He stated that he feels stronger with his heart but that his back his back problems were what was limiting him from walking further.               Exercise Goals and Review:   Exercise Goals     Row Name 02/14/23 1356             Exercise Goals   Increase Physical Activity Yes       Intervention Provide advice, education, support and counseling about physical activity/exercise needs.;Develop an individualized exercise prescription for aerobic and resistive training based on initial evaluation findings, risk stratification, comorbidities and participant's personal goals.       Expected Outcomes Short Term: Attend rehab on a  regular basis to increase amount of physical activity.;Long Term: Add in home exercise to make exercise part of routine and to increase amount of physical activity.;Long Term: Exercising regularly at least 3-5 days a week.       Increase Strength and Stamina Yes       Intervention Provide advice, education, support and counseling about physical activity/exercise needs.;Develop an individualized exercise prescription for aerobic and resistive training based on initial evaluation findings, risk stratification, comorbidities and participant's personal goals.       Expected Outcomes Short Term: Increase workloads from initial exercise prescription for resistance, speed, and METs.;Short Term: Perform resistance training exercises routinely during rehab and add in resistance training at home;Long Term: Improve cardiorespiratory fitness, muscular endurance and strength as measured by increased METs and functional capacity ( )       Able to understand and use rate of perceived exertion (RPE) scale Yes       Intervention Provide education and explanation on how to use RPE scale       Expected Outcomes Short Term: Able to use RPE daily in rehab to express subjective intensity level;Long Term:  Able to use RPE to guide intensity level when exercising independently       Able to understand and use Dyspnea scale Yes       Intervention Provide education and explanation on how to use Dyspnea scale       Expected Outcomes Short Term: Able to use Dyspnea scale daily in rehab to express subjective sense of shortness of breath during exertion;Long Term: Able to use Dyspnea scale to guide intensity level when exercising independently       Knowledge and understanding of Target Heart Rate Range (THRR) Yes       Intervention Provide education and explanation of THRR including how the numbers were predicted and where they are located for reference       Expected Outcomes Short Term: Able to state/look up THRR;Long Term:  Able to use THRR to govern intensity when exercising independently;Short Term: Able to use daily as guideline for intensity in rehab       Able to check pulse independently Yes       Intervention Provide education and demonstration on how to check pulse in carotid and radial arteries.;Review the importance of being able to check your own pulse for safety during independent exercise       Expected Outcomes Short Term:  Able to explain why pulse checking is important during independent exercise;Long Term: Able to check pulse independently and accurately       Understanding of Exercise Prescription Yes       Intervention Provide education, explanation, and written materials on patient's individual exercise prescription       Expected Outcomes Short Term: Able to explain program exercise prescription;Long Term: Able to explain home exercise prescription to exercise independently                Exercise Goals Re-Evaluation :  Exercise Goals Re-Evaluation     Row Name 02/19/23 1146 03/06/23 0746 03/21/23 1103 04/03/23 0748 04/16/23 1152     Exercise Goal Re-Evaluation   Exercise Goals Review Able to understand and use rate of perceived exertion (RPE) scale;Able to understand and use Dyspnea scale;Knowledge and understanding of Target Heart Rate Range (THRR);Understanding of Exercise Prescription Increase Physical Activity;Increase Strength and Stamina;Understanding of Exercise Prescription Increase Physical Activity;Increase Strength and Stamina;Understanding of Exercise Prescription Increase Physical Activity;Increase Strength and Stamina;Understanding of Exercise Prescription Increase Physical Activity;Increase Strength and Stamina;Able to understand and use rate of perceived exertion (RPE) scale;Able to understand and use Dyspnea scale;Knowledge and understanding of Target Heart Rate Range (THRR);Able to check pulse independently;Understanding of Exercise Prescription   Comments Reviewed RPE and  dyspnea scale, THR and program prescription with pt today.  Pt voiced understanding and was given a copy of goals to take home. Raiford Noble is off to a good start in the program. He recently completed his first 2 weeks of exercise. During his first few sessions he was able to increase his level on the T4 nustep to level 4, and increase his level on the recumbent bike to level 4. We will continue to monitor his progress in the program. Raiford Noble is doing well in the program. He continues to work at level 2 on the recumbent bike, T4 nustep, T5 nustep and level 1 on the biostep. He has not done any walking in the program since the last review, but has done well with 3 lb hand weights for resistance training. We will continue to monitor his progress in the program. Raiford Noble continues to do well in the program. He has recently been able to increase his level on the biostep from level 1 to 2. He has also been able to maintain an intensity of level 2 on the recumbent bike and T4 nustep. We will continue to monitor his progress in the program. Reviewed home exercise with pt today.  Pt plans to use peddle machine for exercise.  Reviewed THR, pulse, RPE, sign and symptoms, pulse oximetery and when to call 911 or MD.  Also discussed weather considerations and indoor options.  Pt voiced understanding.Patient did decrease his 6 MWT post distance. He stated that he feels stronger with his heart but that his back his back problems were what was limiting him from walking further.   Expected Outcomes Short: Use RPE daily to regulate intensity. Long: Follow program prescription in THR. Short: Continue to follow current exercise prescription. Long: Continue exercise to improve strength and stamina. Short: Begin walking the track as part of exercise prescription. Long: Continue exercise to improve strength and stamina. Short: Begin walking on the track, increase level on the T5 nustep to level 2. Long: Continue exercise to improve strength and  stamina Short: add 1-2 days a week of home exericise on off days of rehab. Graduate from cardiac rehab. Long: continue with independent exercise program upon graduation from cardiac  rehab.    Row Name 04/18/23 1328             Exercise Goal Re-Evaluation   Exercise Goals Review Increase Physical Activity;Increase Strength and Stamina;Understanding of Exercise Prescription       Comments Raiford Noble continues to do well in rehab. He was recently able to increase his level on the T5 nustep from level 1 to level 2. He was also able to maintain an intensity of level 2 on the T4 nustep and recumbent bike.       Expected Outcomes Short: Try increasing to level 3 on the T4 nustep, and recumbent bike. Long: Continue exercise to improve strength and stamina.                Discharge Exercise Prescription (Final Exercise Prescription Changes):  Exercise Prescription Changes - 04/18/23 1300       Response to Exercise   Blood Pressure (Admit) 110/60    Blood Pressure (Exit) 118/66    Heart Rate (Admit) 63 bpm    Heart Rate (Exercise) 98 bpm    Heart Rate (Exit) 72 bpm    Rating of Perceived Exertion (Exercise) 13    Perceived Dyspnea (Exercise) 0    Symptoms none    Duration Progress to 30 minutes of  aerobic without signs/symptoms of physical distress    Intensity THRR unchanged      Progression   Progression Continue to progress workloads to maintain intensity without signs/symptoms of physical distress.    Average METs 2.5      Resistance Training   Training Prescription Yes    Weight 3 lb    Reps 10-15      Interval Training   Interval Training No      Recumbant Bike   Level 2    Watts 18    Minutes 15      NuStep   Level 2    Minutes 15    METs 3      T5 Nustep   Level 2    Minutes 15    METs 2.5      Home Exercise Plan   Plans to continue exercise at Home (comment)   peddle machine   Frequency Add 2 additional days to program exercise sessions.    Initial Home  Exercises Provided 04/16/23      Oxygen   Maintain Oxygen Saturation 88% or higher             Nutrition:  Target Goals: Understanding of nutrition guidelines, daily intake of sodium 1500mg , cholesterol 200mg , calories 30% from fat and 7% or less from saturated fats, daily to have 5 or more servings of fruits and vegetables.  Education: All About Nutrition: -Group instruction provided by verbal, written material, interactive activities, discussions, models, and posters to present general guidelines for heart healthy nutrition including fat, fiber, MyPlate, the role of sodium in heart healthy nutrition, utilization of the nutrition label, and utilization of this knowledge for meal planning. Follow up email sent as well. Written material given at graduation. Flowsheet Row Cardiac Rehab from 04/11/2023 in [facility] ([place]) Cardiac and Pulmonary Rehab  Date 04/11/23  Educator JG  Instruction Review Code 1- Verbalizes Understanding       Biometrics:  Pre Biometrics - 02/14/23 1357       Pre Biometrics   Height 6' (1.829 m)    Weight 249 lb 12.8 oz (113.3 kg)    Waist Circumference 52 inches    Hip Circumference 48 inches  Waist to Hip Ratio 1.08 %    BMI (Calculated) 33.87    Single Leg Stand 7.03 seconds             Post Biometrics - 04/16/23 1130        Post  Biometrics   Height 6' (1.829 m)    Weight 247 lb 1.6 oz (112.1 kg)    Waist Circumference 51.5 inches    Hip Circumference 47.5 inches    Waist to Hip Ratio 1.08 %    BMI (Calculated) 33.51    Single Leg Stand 3.62 seconds             Nutrition Therapy Plan and Nutrition Goals:  Nutrition Therapy & Goals - 02/14/23 1335       Nutrition Therapy   Diet Cardiac, Low Na    Protein (specify units) 90    Fiber 30 grams    Whole Grain Foods 3 servings    Saturated Fats 15 max. grams    Fruits and Vegetables 5 servings/day    Sodium 2 grams      Personal Nutrition Goals   Nutrition Goal Drink 3 bottles of  water daily    Personal Goal #2 Use healthy fats to promote hearth health, but watch calories    Personal Goal #3 Pair carb with protein at meal    Comments Patient drinking 2 bottles of water, ~32oz daily. Knows he needs to drink more, discussed goal of 3 bottles per day. He reports he tries to eat 3 meals per day. Sometimes eats late in the evening. Educated on [employer] handout, types of fats, sources, and how to read label. His nutrition knowledge was limited but kept things simple and used nutritional food groups sheet as reference to help him build more balanced meals with foods he likes and will focus on smaller portions of meat, leaner meat choices, more colorful produce and complex carbs paired with good protein or healthy fats. Answered questions he had around, saturated fat, sodium, fiber and cholesterol.      Intervention Plan   Intervention Prescribe, educate and counsel regarding individualized specific dietary modifications aiming towards targeted core components such as weight, hypertension, lipid management, diabetes, heart failure and other comorbidities.;Nutrition handout(s) given to patient.    Expected Outcomes Short Term Goal: Understand basic principles of dietary content, such as calories, fat, sodium, cholesterol and nutrients.;Short Term Goal: A plan has been developed with personal nutrition goals set during dietitian appointment.;Long Term Goal: Adherence to prescribed nutrition plan.             Nutrition Assessments:  MEDIFICTS Score Key: >=70 Need to make dietary changes  40-70 Heart Healthy Diet <= 40 Therapeutic Level Cholesterol Diet  Flowsheet Row Cardiac Rehab from 02/14/2023 in [facility] Cardiac and Pulmonary Rehab  Picture Your Plate Total Score on Admission 45      Picture Your Plate Scores: <40 Unhealthy dietary pattern with much room for improvement. 41-50 Dietary pattern unlikely to meet recommendations for good health and room for  improvement. 51-60 More healthful dietary pattern, with some room for improvement.  >60 Healthy dietary pattern, although there may be some specific behaviors that could be improved.    Nutrition Goals Re-Evaluation:  Nutrition Goals Re-Evaluation     Row Name 04/18/23 1138             Goals   Comment Raiford Noble has been trying to incoporate what he learned from the RD. He states one of his barriers  is that his wife gets home late from her work and they have a difficult time prepping healthy meals later in the evening. He doesn't feel good if he eats after 6 and would prefer to eat dinner earlier. He knows this isn't always an option, but we discussed meal prepping on nights she has to work later. He mentioned he is not giving up on making changes to his diet and is finding motivation to make those changes each time he comes to class       Expected Outcome Short: attend cardiac rehab for education and motivation. Long; independently manage heart healty eating.                Nutrition Goals Discharge (Final Nutrition Goals Re-Evaluation):  Nutrition Goals Re-Evaluation - 04/18/23 1138       Goals   Comment Raiford Noble has been trying to incoporate what he learned from the RD. He states one of his barriers is that his wife gets home late from her work and they have a difficult time prepping healthy meals later in the evening. He doesn't feel good if he eats after 6 and would prefer to eat dinner earlier. He knows this isn't always an option, but we discussed meal prepping on nights she has to work later. He mentioned he is not giving up on making changes to his diet and is finding motivation to make those changes each time he comes to class    Expected Outcome Short: attend cardiac rehab for education and motivation. Long; independently manage heart healty eating.             Psychosocial: Target Goals: Acknowledge presence or absence of significant depression and/or stress, maximize coping  skills, provide positive support system. Participant is able to verbalize types and ability to use techniques and skills needed for reducing stress and depression.   Education: Stress, Anxiety, and Depression - Group verbal and visual presentation to define topics covered.  Reviews how body is impacted by stress, anxiety, and depression.  Also discusses healthy ways to reduce stress and to treat/manage anxiety and depression.  Written material given at graduation. Flowsheet Row Cardiac Rehab from 04/11/2023 in [facility] Cardiac and Pulmonary Rehab  Date 03/07/23  Educator SB  Instruction Review Code 1- [employer] Understanding       Education: Sleep Hygiene -Provides group verbal and written instruction about how sleep can affect your health.  Define sleep hygiene, discuss sleep cycles and impact of sleep habits. Review good sleep hygiene tips.    Initial Review & Psychosocial Screening:  Initial Psych Review & Screening - 02/05/23 1028       Initial Review   Current issues with Current Anxiety/Panic      Family Dynamics   Good Support System? Yes    Comments Raiford Noble has intra family strains and can get depressed over his daughter that is off of drugs now but doesnt get to see his grandkids. He has his wife, daughter and good friend to look to for support.      Barriers   Psychosocial barriers to participate in program The patient should benefit from training in stress management and relaxation.;There are no identifiable barriers or psychosocial needs.      Screening Interventions   Interventions To provide support and resources with identified psychosocial needs;Provide feedback about the scores to participant;Encouraged to exercise    Expected Outcomes Short Term goal: Utilizing psychosocial counselor, staff and physician to assist with identification of specific Stressors or current  issues interfering with healing process. Setting desired goal for each stressor or current issue  identified.;Long Term Goal: Stressors or current issues are controlled or eliminated.;Short Term goal: Identification and review with participant of any Quality of Life or Depression concerns found by scoring the questionnaire.;Long Term goal: The participant improves quality of Life and PHQ9 Scores as seen by post scores and/or verbalization of changes             Quality of Life Scores:   Quality of Life - 02/14/23 1359       Quality of Life   Select Quality of Life      Quality of Life Scores   Health/Function Pre 15.5 %    Socioeconomic Pre 24.29 %    Psych/Spiritual Pre 24.64 %    Family Pre 25.2 %    GLOBAL Pre 20.62 %            Scores of 19 and below usually indicate a poorer quality of life in these areas.  A difference of  2-3 points is a clinically meaningful difference.  A difference of 2-3 points in the total score of the Quality of Life Index has been associated with significant improvement in overall quality of life, self-image, physical symptoms, and general health in studies assessing change in quality of life.  PHQ-9: Review Flowsheet       02/14/2023 07/02/2017  Depression screen PHQ 2/9  Decreased Interest 1 0  Down, Depressed, Hopeless 0 0  PHQ - 2 Score 1 0  Altered sleeping 1 -  Tired, decreased energy 1 -  Change in appetite 0 -  Feeling bad or failure about yourself  1 -  Trouble concentrating 0 -  Moving slowly or fidgety/restless 0 -  Suicidal thoughts 0 -  PHQ-9 Score 4 -  Difficult doing work/chores Somewhat difficult -    Details           Interpretation of Total Score  Total Score Depression Severity:  1-4 = Minimal depression, 5-9 = Mild depression, 10-14 = Moderate depression, 15-19 = Moderately severe depression, 20-27 = Severe depression   Psychosocial Evaluation and Intervention:  Psychosocial Evaluation - 02/05/23 1032       Psychosocial Evaluation & Interventions   Interventions Encouraged to exercise with the program  and follow exercise prescription;Stress management education;Relaxation education    Comments Raiford Noble has intra family strains and can get depressed over his daughter that is off of drugs now but doesnt get to see his grandkids. He has his wife, daughter and good friend to look to for support.    Expected Outcomes Short: Start [facility] to help with mood. Long: Maintain a healthy mental state    Continue Psychosocial Services  Follow up required by staff             Psychosocial Re-Evaluation:  Psychosocial Re-Evaluation     Row Name 03/26/23 1130 04/18/23 1132           Psychosocial Re-Evaluation   Current issues with Current Stress Concerns;Current Sleep Concerns Current Stress Concerns      Comments Raiford Noble is enjoying coming to cardiac rehab, however it does cause some anxiety the nights before making it hard to sleep. He knows he is getitng older and is scared something will happen while he is exercising. He hasn't had chest pain like he did prior to his MI, but sometimes he gets these episodes of chest discomfort. He plans to discuss these concerns with his doctor. He  felt reassured when staff went over his progress in the short time he has been here and he feels motivated to continue coming to see more progress. His wife runs an after school care program, so he doesn't do too much when he is by himself. He enjoys watching football, but feels like he doesn't do too much outside of that. He wants to find something more active to do so he is glad he is involved in the program. He wants to meet with his doctor to discuss his health concerns and is making a list of questions for him. Raiford Noble is still enjoying coming to the program. He states some of his anxiety the night before has eased since coming consistently to the program. He reports sleeping better than he has and he is using his sleep medication appropriately. He went to the hospital last week for chest pain and was given the all clear. This  was  relief to him, but also frustrating that no one could tell him why he was having pain. He follows up with his doctor regularly and we discussed importance of going to the hospital in those situations is appropriate, even if they don't find anything. He and his wife still are not able to talk to their kids or grandkids like they want, which is extra sad to him during the current holiday season. He hopes that those involved will come to their senses, and knows that a lot of it is out of his control so he is trying to stay focused on the positive things happening in his life. He has been able to text on of his older granddaughters which he was happy about. He and his wife are each other's support system and they hope their kids will return to their inner circle one day. He wants to stay active because he can tell that this program has helped him physically and mentally. We discussed plans after graduation and he will discuss the [facility] with his wife soon.      Expected Outcomes Short: attend cardiac rehab for education and exercise. Long; find and maintain positive self care hobbies Short: continue attending cardiac rehab for education and exercise. Long: maintain positive sleep habits and stay focused on the things in his control.      Interventions -- Encouraged to attend Cardiac Rehabilitation for the exercise;Stress management education      Continue Psychosocial Services  Follow up required by staff Follow up required by staff               Psychosocial Discharge (Final Psychosocial Re-Evaluation):  Psychosocial Re-Evaluation - 04/18/23 1132       Psychosocial Re-Evaluation   Current issues with Current Stress Concerns    Comments Raiford Noble is still enjoying coming to the program. He states some of his anxiety the night before has eased since coming consistently to the program. He reports sleeping better than he has and he is using his sleep medication appropriately. He went to the hospital last  week for chest pain and was given the all clear. This was  relief to him, but also frustrating that no one could tell him why he was having pain. He follows up with his doctor regularly and we discussed importance of going to the hospital in those situations is appropriate, even if they don't find anything. He and his wife still are not able to talk to their kids or grandkids like they want, which is extra sad to him during the current  holiday season. He hopes that those involved will come to their senses, and knows that a lot of it is out of his control so he is trying to stay focused on the positive things happening in his life. He has been able to text on of his older granddaughters which he was happy about. He and his wife are each other's support system and they hope their kids will return to their inner circle one day. He wants to stay active because he can tell that this program has helped him physically and mentally. We discussed plans after graduation and he will discuss the [facility] with his wife soon.    Expected Outcomes Short: continue attending cardiac rehab for education and exercise. Long: maintain positive sleep habits and stay focused on the things in his control.    Interventions Encouraged to attend Cardiac Rehabilitation for the exercise;Stress management education    Continue Psychosocial Services  Follow up required by staff             Vocational Rehabilitation: Provide vocational rehab assistance to qualifying candidates.   Vocational Rehab Evaluation & Intervention:  Vocational Rehab - 04/18/23 1132       Initial Vocational Rehab Evaluation & Intervention   Assessment shows need for Vocational Rehabilitation No             Education: Education Goals: Education classes will be provided on a variety of topics geared toward better understanding of heart health and risk factor modification. Participant will state understanding/return demonstration of topics presented  as noted by education test scores.  Learning Barriers/Preferences:  Learning Barriers/Preferences - 02/05/23 1028       Learning Barriers/Preferences   Learning Barriers None    Learning Preferences None             General Cardiac Education Topics:  AED/CPR: - Group verbal and written instruction with the use of models to demonstrate the basic use of the AED with the basic ABC's of resuscitation.   Anatomy and Cardiac Procedures: - Group verbal and visual presentation and models provide information about basic cardiac anatomy and function. Reviews the testing methods done to diagnose heart disease and the outcomes of the test results. Describes the treatment choices: Medical Management, Angioplasty, or Coronary Bypass Surgery for treating various heart conditions including Myocardial Infarction, Angina, Valve Disease, and Cardiac Arrhythmias.  Written material given at graduation. Flowsheet Row Cardiac Rehab from 04/11/2023 in [facility] Cardiac and Pulmonary Rehab  Education need identified 02/14/23       Medication Safety: - Group verbal and visual instruction to review commonly prescribed medications for heart and lung disease. Reviews the medication, class of the drug, and side effects. Includes the steps to properly store meds and maintain the prescription regimen.  Written material given at graduation.   Intimacy: - Group verbal instruction through game format to discuss how heart and lung disease can affect sexual intimacy. Written material given at graduation..   Know Your Numbers and Heart Failure: - Group verbal and visual instruction to discuss disease risk factors for cardiac and pulmonary disease and treatment options.  Reviews associated critical values for Overweight/Obesity, Hypertension, Cholesterol, and Diabetes.  Discusses basics of heart failure: signs/symptoms and treatments.  Introduces Heart Failure Zone chart for action plan for heart failure.  Written  material given at graduation. Flowsheet Row Cardiac Rehab from 04/11/2023 in [facility] Cardiac and Pulmonary Rehab  Education need identified 02/14/23  Date 02/28/23  Educator SB  Instruction Review Code 1- [employer]  Understanding       Infection Prevention: - Provides verbal and written material to individual with discussion of infection control including proper hand washing and proper equipment cleaning during exercise session. Flowsheet Row Cardiac Rehab from 04/11/2023 in [facility] Cardiac and Pulmonary Rehab  Date 02/14/23  Educator [facility]  Instruction Review Code 1- Verbalizes Understanding       Falls Prevention: - Provides verbal and written material to individual with discussion of falls prevention and safety. Flowsheet Row Cardiac Rehab from 04/11/2023 in [facility] Cardiac and Pulmonary Rehab  Date 02/14/23  Educator [facility]  Instruction Review Code 1- Verbalizes Understanding       Other: -Provides group and verbal instruction on various topics (see comments)   Knowledge Questionnaire Score:  Knowledge Questionnaire Score - 02/14/23 1358       Knowledge Questionnaire Score   Pre Score 21/26             Core Components/Risk Factors/Patient Goals at Admission:  Personal Goals and Risk Factors at Admission - 02/14/23 1359       Core Components/Risk Factors/Patient Goals on Admission    Weight Management Yes;Weight Loss    Intervention Weight Management: Develop a combined nutrition and exercise program designed to reach desired caloric intake, while maintaining appropriate intake of nutrient and fiber, sodium and fats, and appropriate energy expenditure required for the weight goal.;Weight Management: Provide education and appropriate resources to help participant work on and attain dietary goals.;Weight Management/Obesity: Establish reasonable short term and long term weight goals.;Obesity: Provide education and appropriate resources to help participant work on and attain  dietary goals.    Admit Weight 249 lb 12.8 oz (113.3 kg)    Goal Weight: Short Term 240 lb (108.9 kg)    Goal Weight: Long Term 185 lb (83.9 kg)    Expected Outcomes Short Term: Continue to assess and modify interventions until short term weight is achieved;Long Term: Adherence to nutrition and physical activity/exercise program aimed toward attainment of established weight goal;Understanding recommendations for meals to include 15-35% energy as protein, 25-35% energy from fat, 35-60% energy from carbohydrates, less than 200mg  of dietary cholesterol, 20-35 gm of total fiber daily;Understanding of distribution of calorie intake throughout the day with the consumption of 4-5 meals/snacks;Weight Loss: Understanding of general recommendations for a balanced deficit meal plan, which promotes 1-2 lb weight loss per week and includes a negative energy balance of [id number] kcal/d    Heart Failure Yes    Intervention Provide a combined exercise and nutrition program that is supplemented with education, support and counseling about heart failure. Directed toward relieving symptoms such as shortness of breath, decreased exercise tolerance, and extremity edema.    Expected Outcomes Improve functional capacity of life;Short term: Attendance in program 2-3 days a week with increased exercise capacity. Reported lower sodium intake. Reported increased fruit and vegetable intake. Reports medication compliance.;Short term: Daily weights obtained and reported for increase. Utilizing diuretic protocols set by physician.;Long term: Adoption of self-care skills and reduction of barriers for early signs and symptoms recognition and intervention leading to self-care maintenance.    Hypertension Yes    Intervention Provide education on lifestyle modifcations including regular physical activity/exercise, weight management, moderate sodium restriction and increased consumption of fresh fruit, vegetables, and low fat dairy, alcohol  moderation, and smoking cessation.;Monitor prescription use compliance.    Expected Outcomes Short Term: Continued assessment and intervention until BP is < 140/5mm HG in hypertensive participants. < 130/62mm HG in hypertensive participants with diabetes, heart failure  or chronic kidney disease.;Long Term: Maintenance of blood pressure at goal levels.    Lipids Yes    Intervention Provide education and support for participant on nutrition & aerobic/resistive exercise along with prescribed medications to achieve LDL 70mg , HDL >40mg .    Expected Outcomes Short Term: Participant states understanding of desired cholesterol values and is compliant with medications prescribed. Participant is following exercise prescription and nutrition guidelines.;Long Term: Cholesterol controlled with medications as prescribed, with individualized exercise RX and with personalized nutrition plan. Value goals: LDL < 70mg , HDL > 40 mg.             Education:Diabetes - Individual verbal and written instruction to review signs/symptoms of diabetes, desired ranges of glucose level fasting, after meals and with exercise. Acknowledge that pre and post exercise glucose checks will be done for 3 sessions at entry of program.   Core Components/Risk Factors/Patient Goals Review:   Goals and Risk Factor Review     Row Name 03/26/23 1111 04/18/23 1127           Core Components/Risk Factors/Patient Goals Review   Personal Goals Review Weight Management/Obesity;Heart Failure Heart Failure;Lipids;Weight Management/Obesity;Hypertension      Review Raiford Noble has enjoyed the program so far. He has been staying at the same weight for 2 months. One doctor mentioned some fluid in his ankles, so he is making sure he is taking his lasix approrpirately. He sees his cardiologist in a few weeks and plans to ask him questions related to heart health. He had gained weight after taking prednisone as part of his cancer treatment, so he feels  like he is trying to fight a losing battle. He has met with the dietician and is trying to implement those things with his wife's help. Raiford Noble went to his cardiologist appointment a few weeks ago. He was told his cholesterol looked stable and to keep taking his medication. He asked questions about his fluid managment and was given more direction on his lasix. He is contiuing to weigh himself daily and knows it will be a longer process than he wants to lose weight after gaining with the cancer treatment. He is trying to add in more exercise on his off days so he can feel more confident in exercising on his own and helping his weight loss.He does not take his blood pressure regularly at home because he feels like he gets it checked enough while in the program 3 times a week and all his doctor appointments. We discussed importance of checking blood pressure at home, especially once he graduates from the program      Expected Outcomes Short: attend cardiac rehab for education and exercise. Long: Independently manage risk factors Short:continue weighing daily and start checking his blood pressure on his off days from the program. Long: manage risk factors independently               Core Components/Risk Factors/Patient Goals at Discharge (Final Review):   Goals and Risk Factor Review - 04/18/23 1127       Core Components/Risk Factors/Patient Goals Review   Personal Goals Review Heart Failure;Lipids;Weight Management/Obesity;Hypertension    Review Raiford Noble went to his cardiologist appointment a few weeks ago. He was told his cholesterol looked stable and to keep taking his medication. He asked questions about his fluid managment and was given more direction on his lasix. He is contiuing to weigh himself daily and knows it will be a longer process than he wants to lose weight after gaining with  the cancer treatment. He is trying to add in more exercise on his off days so he can feel more confident in exercising on  his own and helping his weight loss.He does not take his blood pressure regularly at home because he feels like he gets it checked enough while in the program 3 times a week and all his doctor appointments. We discussed importance of checking blood pressure at home, especially once he graduates from the program    Expected Outcomes Short:continue weighing daily and start checking his blood pressure on his off days from the program. Long: manage risk factors independently             ITP Comments:  ITP Comments     Row Name 02/05/23 1026 02/14/23 1348 02/19/23 1146 02/28/23 1223 03/28/23 1314   ITP Comments Virtual Visit completed. Patient informed on EP and RD appointment and 6 Minute walk test. Patient also informed of patient health questionnaires on My Chart. Patient Verbalizes understanding. Visit diagnosis can [facility] found in [facility]. Completed and gym orientation. Initial ITP created and sent for review to Dr. Bethann Punches, Medical Director. First full day of exercise!  Patient was oriented to gym and equipment including functions, settings, policies, and procedures.  Patient's individual exercise prescription and treatment plan were reviewed.  All starting workloads were established based on the results of the 6 minute walk test done at initial orientation visit.  The plan for exercise progression was also introduced and progression will be customized based on patient's performance and goals. 30 Day review completed. Medical Director ITP review done, changes made as directed, and signed approval by Medical Director.    new to program 30 Day review completed. Medical Director ITP review done, changes made as directed, and signed approval by Medical Director.    Row Name 04/18/23 1014 04/23/23 1323         ITP Comments 30 Day review completed. Medical Director ITP review done, changes made as directed, and signed approval by Medical Director. 30 Day review completed. Medical Director  ITP review done, changes made as directed, and signed approval by Medical Director.               Comments:

## 2023-04-24 ENCOUNTER — Inpatient Hospital Stay: Payer: HMO

## 2023-04-24 VITALS — BP 108/60 | HR 64 | Temp 97.6°F | Resp 16

## 2023-04-24 DIAGNOSIS — D509 Iron deficiency anemia, unspecified: Secondary | ICD-10-CM

## 2023-04-24 DIAGNOSIS — C3492 Malignant neoplasm of unspecified part of left bronchus or lung: Secondary | ICD-10-CM

## 2023-04-24 DIAGNOSIS — E860 Dehydration: Secondary | ICD-10-CM

## 2023-04-24 MED ORDER — IRON SUCROSE 20 MG/ML IV SOLN
200.0000 mg | Freq: Once | INTRAVENOUS | Status: AC
  Administered 2023-04-24: 200 mg via INTRAVENOUS
  Filled 2023-04-24: qty 10

## 2023-04-24 NOTE — Patient Instructions (Signed)
Iron Sucrose Injection What is this medication? IRON SUCROSE (EYE ern SOO krose) treats low levels of iron (iron deficiency anemia) in people with kidney disease. Iron is a mineral that plays an important role in making red blood cells, which carry oxygen from your lungs to the rest of your body. This medicine may be used for other purposes; ask your health care provider or pharmacist if you have questions. COMMON BRAND NAME(S): Venofer What should I tell my care team before I take this medication? They need to know if you have any of these conditions: Anemia not caused by low iron levels Heart disease High levels of iron in the blood Kidney disease Liver disease An unusual or allergic reaction to iron, other medications, foods, dyes, or preservatives Pregnant or trying to get pregnant Breastfeeding How should I use this medication? This medication is for infusion into a vein. It is given in a hospital or clinic setting. Talk to your care team about the use of this medication in children. While this medication may be prescribed for children as young as 2 years for selected conditions, precautions do apply. Overdosage: If you think you have taken too much of this medicine contact a poison control center or emergency room at once. NOTE: This medicine is only for you. Do not share this medicine with others. What if I miss a dose? Keep appointments for follow-up doses. It is important not to miss your dose. Call your care team if you are unable to keep an appointment. What may interact with this medication? Do not take this medication with any of the following: Deferoxamine Dimercaprol Other iron products This medication may also interact with the following: Chloramphenicol Deferasirox This list may not describe all possible interactions. Give your health care provider a list of all the medicines, herbs, non-prescription drugs, or dietary supplements you use. Also tell them if you smoke,  drink alcohol, or use illegal drugs. Some items may interact with your medicine. What should I watch for while using this medication? Visit your care team regularly. Tell your care team if your symptoms do not start to get better or if they get worse. You may need blood work done while you are taking this medication. You may need to follow a special diet. Talk to your care team. Foods that contain iron include: whole grains/cereals, dried fruits, beans, or peas, leafy green vegetables, and organ meats (liver, kidney). What side effects may I notice from receiving this medication? Side effects that you should report to your care team as soon as possible: Allergic reactions--skin rash, itching, hives, swelling of the face, lips, tongue, or throat Low blood pressure--dizziness, feeling faint or lightheaded, blurry vision Shortness of breath Side effects that usually do not require medical attention (report to your care team if they continue or are bothersome): Flushing Headache Joint pain Muscle pain Nausea Pain, redness, or irritation at injection site This list may not describe all possible side effects. Call your doctor for medical advice about side effects. You may report side effects to FDA at 1-800-FDA-1088. Where should I keep my medication? This medication is given in a hospital or clinic. It will not be stored at home. NOTE: This sheet is a summary. It may not cover all possible information. If you have questions about this medicine, talk to your doctor, pharmacist, or health care provider.  2024 Elsevier/Gold Standard (2022-10-20 00:00:00)

## 2023-04-25 ENCOUNTER — Encounter: Payer: HMO | Admitting: *Deleted

## 2023-04-25 DIAGNOSIS — I214 Non-ST elevation (NSTEMI) myocardial infarction: Secondary | ICD-10-CM

## 2023-04-25 DIAGNOSIS — Z955 Presence of coronary angioplasty implant and graft: Secondary | ICD-10-CM | POA: Diagnosis not present

## 2023-04-25 NOTE — Progress Notes (Signed)
Daily Session Note  Patient Details  Name: Marc Gray MRN: [id number] Date of Birth: [birth year] Referring Provider:   Flowsheet Row Cardiac Rehab from 02/14/2023 in [facility]  Referring Provider Dr. Marcina Millard       Encounter Date: 04/25/2023  Check In:  Session Check In - 04/25/23 1105       Check-In   Supervising physician immediately available to respond to emergencies See telemetry face sheet for immediately available ER MD    Location [facility]-Cardiac & Pulmonary Rehab    Staff Present Rory Percy, MS, Exercise Physiologist;Noah Tickle, BS, Exercise Physiologist;Meredith Jewel Baize, RN Atilano Median, RN, ADN    Virtual Visit No    Medication changes reported     No    Fall or balance concerns reported    No    Warm-up and Cool-down Performed on first and last piece of equipment    Resistance Training Performed Yes    VAD Patient? No    PAD/SET Patient? No      Pain Assessment   Currently in Pain? No/denies                Social History   Tobacco Use  Smoking Status Former   Current packs/day: 0.00   Average packs/day: 1 pack/day for 53.0 years (53.0 ttl pk-yrs)   Types: Cigarettes   Start date: 04/14/1964   Quit date: 04/14/2017   Years since quitting: 6.0   Passive exposure: Past  Smokeless Tobacco Never  Tobacco Comments   Quit November 2018    Goals Met:  Independence with exercise equipment Exercise tolerated well No report of concerns or symptoms today Strength training completed today  Goals Unmet:  Not Applicable  Comments: Pt able to follow exercise prescription today without complaint.  Will continue to monitor for progression.    Dr. Bethann Punches is Medical Director for [facility] Card[facility].  Dr. Vida Rigger is Medical Director for [facility].

## 2023-04-30 ENCOUNTER — Encounter: Payer: HMO | Attending: Cardiology | Admitting: *Deleted

## 2023-04-30 DIAGNOSIS — Z955 Presence of coronary angioplasty implant and graft: Secondary | ICD-10-CM | POA: Diagnosis not present

## 2023-04-30 DIAGNOSIS — I214 Non-ST elevation (NSTEMI) myocardial infarction: Secondary | ICD-10-CM | POA: Insufficient documentation

## 2023-04-30 NOTE — Progress Notes (Signed)
Cardiac Individual Treatment Plan  Patient Details  Name: Marc Gray MRN: [id number] Date of Birth: [birth year] Referring Provider:   Flowsheet Row Cardiac Rehab from 02/14/2023 in [facility] Cardiac and Pulmonary Rehab  Referring Provider Dr. Marcina Millard       Initial Encounter Date:  Flowsheet Row Cardiac Rehab from 02/14/2023 in [facility] Cardiac and Pulmonary Rehab  Date 02/14/23       Visit Diagnosis: NSTEMI (non-ST elevated myocardial infarction) [facility])  Status post coronary artery stent placement  Patient's Home Medications on Admission:  Current Outpatient Medications:    acetaminophen (TYLENOL) 500 MG tablet, Take 1,000 mg by mouth every 6 (six) hours as needed for moderate pain., Disp: , Rfl:    aspirin EC 81 MG tablet, Take 1 tablet (81 mg total) by mouth daily. Swallow whole., Disp: 90 tablet, Rfl: 0   Azelastine HCl 137 MCG/SPRAY SOLN, SMARTSIG:1-2 Spray(s) Both Nares Twice Daily, Disp: , Rfl:    BREZTRI AEROSPHERE 160-9-4.8 MCG/ACT AERO, Inhale 2 puffs into the lungs 2 (two) times daily., Disp: , Rfl:    calcium-vitamin D (OSCAL WITH D) 500-200 MG-UNIT tablet, Take 1 tablet by mouth daily., Disp: 90 tablet, Rfl: 1   diphenhydrAMINE-zinc acetate (BENADRYL EXTRA STRENGTH) cream, Apply 1 application topically 3 (three) times daily as needed for itching., Disp: 28 g, Rfl: 0   DUPIXENT 300 MG/2ML SOPN, Inject 300 mg into the skin every 14 (fourteen) days., Disp: , Rfl:    EPINEPHrine 0.3 mg/0.3 mL IJ SOAJ injection, Inject 0.3 mg into the muscle as needed., Disp: , Rfl:    fluticasone (FLONASE) 50 MCG/ACT nasal spray, Place 1 spray into both nostrils daily., Disp: 16 g, Rfl: 2   furosemide (LASIX) 20 MG tablet, Take by mouth., Disp: , Rfl:    Ipratropium-Albuterol (COMBIVENT) 20-100 MCG/ACT AERS respimat, Inhale into the lungs., Disp: , Rfl:    levothyroxine (SYNTHROID) 137 MCG tablet, Take by mouth., Disp: , Rfl:    Melatonin 10 MG TABS, Take 10 mg by mouth at  bedtime. , Disp: , Rfl:    metoprolol tartrate (LOPRESSOR) 25 MG tablet, Take 0.5 tablets (12.5 mg total) by mouth 2 (two) times daily., Disp: 30 tablet, Rfl: 2   montelukast (SINGULAIR) 10 MG tablet, Take 10 mg by mouth daily., Disp: , Rfl:    Multiple Vitamin (MULTI-VITAMINS) TABS, Take 1 tablet by mouth daily. , Disp: , Rfl:    pantoprazole (PROTONIX) 40 MG tablet, Take 1 tablet (40 mg total) by mouth daily., Disp: 90 tablet, Rfl: 0   polyethylene glycol-electrolytes (NULYTELY) 420 g solution, MIX AND DRINK AS DIRECTED, Disp: , Rfl:    prasugrel (EFFIENT) 10 MG TABS tablet, Take 1 tablet (10 mg total) by mouth daily., Disp: 30 tablet, Rfl: 11   predniSONE (DELTASONE) 5 MG tablet, Take 5 mg by mouth daily., Disp: , Rfl:    prochlorperazine (COMPAZINE) 10 MG tablet, Take 1 tablet (10 mg total) by mouth every 6 (six) hours as needed for nausea or vomiting., Disp: 60 tablet, Rfl: 2   rosuvastatin (CRESTOR) 10 MG tablet, , Disp: , Rfl:    SHINGRIX injection, , Disp: , Rfl:    SPIKEVAX syringe, , Disp: , Rfl:    tadalafil (CIALIS) 5 MG tablet, Take 5-10mg  by mouth as needed prior to intercourse, Disp: 30 tablet, Rfl: 11   tamsulosin (FLOMAX) 0.4 MG CAPS capsule, Take 1 capsule (0.4 mg total) by mouth daily., Disp: 90 capsule, Rfl: 3   traZODone (DESYREL) 100 MG tablet,  Take 100 mg by mouth at bedtime., Disp: , Rfl:    triamcinolone ointment (KENALOG) 0.5 %, Apply 1 application topically 3 (three) times daily as needed., Disp: 30 g, Rfl: 1 No current facility-administered medications for this visit.  Facility-Administered Medications Ordered in Other Visits:    sodium chloride flush (NS) 0.9 % injection 10 mL, 10 mL, Intravenous, PRN, Rickard Patience, MD, 10 mL at 04/03/17 1610  Past Medical History: Past Medical History:  Diagnosis Date   Anxiety    Aortic atherosclerosis (HCC)    Arthritis    SHOULDER   BPH (benign prostatic hyperplasia)    Cataract    Chronic kidney disease    low kidney  function   Colon adenomas    Colon cancer (HCC) 2015   Pt states during his colonoscopy he had cancerous polyps removed.    COPD (chronic obstructive pulmonary disease) (HCC)    Depression    SINCE DIAGNOSIS   Dyspnea    DOE   Enlarged prostate    GERD (gastroesophageal reflux disease)    Hypertension    Hypothyroidism    Lung cancer (HCC)    Aug 2018   Pain    DUE TO LUNG ISSUE    Tobacco Use: Social History   Tobacco Use  Smoking Status Former   Current packs/day: 0.00   Average packs/day: 1 pack/day for 53.0 years (53.0 ttl pk-yrs)   Types: Cigarettes   Start date: 04/14/1964   Quit date: 04/14/2017   Years since quitting: 6.0   Passive exposure: Past  Smokeless Tobacco Never  Tobacco Comments   Quit November 2018    Labs: Review Flowsheet       Latest Ref Rng & Units 12/28/2022  Labs for ITP Cardiac and Pulmonary Rehab  Cholestrol 0 - 200 mg/dL 960   LDL (calc) 0 - 99 mg/dL 59   HDL-C >45 mg/dL 54   Trlycerides <409 mg/dL 74   Hemoglobin W1X 4.8 - 5.6 % 5.8     Details             Exercise Target Goals: Exercise Program Goal: Individual exercise prescription set using results from initial 6 min walk test and THRR while considering  patient's activity barriers and safety.   Exercise Prescription Goal: Initial exercise prescription builds to 30-45 minutes a day of aerobic activity, 2-3 days per week.  Home exercise guidelines will be given to patient during program as part of exercise prescription that the participant will acknowledge.   Education: Aerobic Exercise: - Group verbal and visual presentation on the components of exercise prescription. Introduces F.I.T.T principle from ACSM for exercise prescriptions.  Reviews F.I.T.T. principles of aerobic exercise including progression. Written material given at graduation. Flowsheet Row Cardiac Rehab from 04/11/2023 in [facility] Cardiac and Pulmonary Rehab  Education need identified 02/14/23        Education: Resistance Exercise: - Group verbal and visual presentation on the components of exercise prescription. Introduces F.I.T.T principle from ACSM for exercise prescriptions  Reviews F.I.T.T. principles of resistance exercise including progression. Written material given at graduation. Flowsheet Row Cardiac Rehab from 04/11/2023 in [facility] Cardiac and Pulmonary Rehab  Date 03/21/23  Educator NT  Instruction Review Code 1- Verbalizes Understanding        Education: Exercise & Equipment Safety: - Individual verbal instruction and demonstration of equipment use and safety with use of the equipment. Flowsheet Row Cardiac Rehab from 04/11/2023 in [facility] Cardiac and Pulmonary Rehab  Date 02/14/23  Educator [employer]  Instruction Review Code 1- Verbalizes Understanding       Education: Exercise Physiology & General Exercise Guidelines: - Group verbal and written instruction with models to review the exercise physiology of the cardiovascular system and associated critical values. Provides general exercise guidelines with specific guidelines to those with heart or lung disease.  Flowsheet Row Cardiac Rehab from 04/11/2023 in [facility] Cardiac and Pulmonary Rehab  Education need identified 02/14/23  Date 03/14/23  Educator NT  Instruction Review Code 1- [employer] Understanding       Education: Flexibility, Balance, Mind/Body Relaxation: - Group verbal and visual presentation with interactive activity on the components of exercise prescription. Introduces F.I.T.T principle from ACSM for exercise prescriptions. Reviews F.I.T.T. principles of flexibility and balance exercise training including progression. Also discusses the mind body connection.  Reviews various relaxation techniques to help reduce and manage stress (i.e. Deep breathing, progressive muscle relaxation, and visualization). Balance handout provided to take home. Written material given at graduation. Flowsheet Row Cardiac Rehab from  04/11/2023 in [facility] Cardiac and Pulmonary Rehab  Date 03/21/23  Educator NT  Instruction Review Code 1- Verbalizes Understanding       Activity Barriers & Risk Stratification:  Activity Barriers & Cardiac Risk Stratification - 02/14/23 1351       Activity Barriers & Cardiac Risk Stratification   Activity Barriers Arthritis;Balance Concerns;Back Problems    Cardiac Risk Stratification High             6 Minute Walk:  6 Minute Walk     Row Name 02/14/23 1350 04/16/23 1129       6 Minute Walk   Phase Initial Discharge    Distance 690 feet 570 feet    Distance % Change -- -17 %    Distance Feet Change -- -120 ft    Walk Time 6 minutes 5.25 minutes    # of Rest Breaks 0 1    MPH 1.31 1.23    METS 1.34 1.1    RPE 15 12    Perceived Dyspnea  3 3    VO2 Peak 4.68 3.85    Symptoms Yes (comment) Yes (comment)    Comments back pain while walking 8/10 relieved with rest. SOB 3 back pain while walking 8/10 relieved with rest. SOB 3    Resting HR 61 bpm 82 bpm    Resting BP 138/70 122/70    Resting Oxygen Saturation  96 % 94 %    Exercise Oxygen Saturation  during 6 min walk 97 % 92 %    Max Ex. HR 109 bpm 107 bpm    Max Ex. BP 142/80 138/62    2 Minute Post BP 130/78 128/82             Oxygen Initial Assessment:   Oxygen Re-Evaluation:   Oxygen Discharge (Final Oxygen Re-Evaluation):   Initial Exercise Prescription:  Initial Exercise Prescription - 02/14/23 1300       Date of Initial Exercise RX and Referring Provider   Date 02/14/23    Referring Provider Dr. Marcina Millard      Recumbant Bike   Level 1    RPM 50    Watts 15    Minutes 15      NuStep   Level 1    SPM 80    Minutes 15    METs 1.34      REL-XR   Level 1    Speed 50    Minutes 15    METs 1.34  Biostep-RELP   Level 1    SPM 50    Minutes 15    METs 1.34      Track   Laps 10    Minutes 15    METs 1.54      Prescription Details   Frequency (times per week) 3     Duration Progress to 30 minutes of continuous aerobic without signs/symptoms of physical distress      Intensity   THRR 40-80% of Max Heartrate 94-127    Ratings of Perceived Exertion 11-13    Perceived Dyspnea 0-4      Progression   Progression Continue to progress workloads to maintain intensity without signs/symptoms of physical distress.      Resistance Training   Training Prescription Yes    Weight 3    Reps 10-15             Perform Capillary Blood Glucose checks as needed.  Exercise Prescription Changes:   Exercise Prescription Changes     Row Name 02/14/23 1300 03/06/23 0700 03/21/23 1100 04/03/23 0700 04/16/23 1100     Response to Exercise   Blood Pressure (Admit) 138/70 118/62 116/58 128/58 --   Blood Pressure (Exercise) 142/80 142/68 128/64 132/54 --   Blood Pressure (Exit) 130/78 138/70 138/62 132/64 --   Heart Rate (Admit) 61 bpm 74 bpm 86 bpm 65 bpm --   Heart Rate (Exercise) 109 bpm 113 bpm 103 bpm 110 bpm --   Heart Rate (Exit) 66 bpm 72 bpm 93 bpm 76 bpm --   Oxygen Saturation (Admit) 96 % -- -- -- --   Oxygen Saturation (Exercise) 97 % -- -- -- --   Oxygen Saturation (Exit) 96 % -- -- -- --   Rating of Perceived Exertion (Exercise) 15 15 14 13  --   Perceived Dyspnea (Exercise) 3 0 -- 0 --   Symptoms back pain 8/10 while walking and SOB -- none none --   Comments 6 MWT results -- -- -- --   Duration -- Progress to 30 minutes of  aerobic without signs/symptoms of physical distress Progress to 30 minutes of  aerobic without signs/symptoms of physical distress Progress to 30 minutes of  aerobic without signs/symptoms of physical distress Progress to 30 minutes of  aerobic without signs/symptoms of physical distress   Intensity -- THRR unchanged THRR unchanged THRR unchanged THRR unchanged     Progression   Progression -- Continue to progress workloads to maintain intensity without signs/symptoms of physical distress. Continue to progress workloads to  maintain intensity without signs/symptoms of physical distress. Continue to progress workloads to maintain intensity without signs/symptoms of physical distress. Continue to progress workloads to maintain intensity without signs/symptoms of physical distress.   Average METs -- 2.5 2.4 2.3 2.3     Resistance Training   Training Prescription -- Yes Yes Yes Yes   Weight -- 3 lb 3 lb 3 lb 3 lb   Reps -- 10-15 10-15 10-15 10-15     Interval Training   Interval Training -- No No No No     Recumbant Bike   Level -- 4 2 2 2    Watts -- 15 18 18 18    Minutes -- 15 15 15 15      NuStep   Level -- 4 2 2 2    Minutes -- 15 15 15 15    METs -- 3.3 3.4 2.9 2.9     REL-XR   Level -- 2 -- -- --   Minutes --  15 -- -- --   METs -- 3.4 -- -- --     T5 Nustep   Level -- -- 2 1 1    Minutes -- -- 15 15 15    METs -- -- 2.1 1.9 1.9     Biostep-RELP   Level -- 1 1 2 2    Minutes -- 15 15 15 15    METs -- 2 2 2 2      Track   Laps -- 16 -- -- --   Minutes -- 15 -- -- --   METs -- 1.87 -- -- --     Home Exercise Plan   Plans to continue exercise at -- -- -- -- Home (comment)  peddle machine   Frequency -- -- -- -- Add 2 additional days to program exercise sessions.   Initial Home Exercises Provided -- -- -- -- 04/16/23     Oxygen   Maintain Oxygen Saturation -- 88% or higher 88% or higher 88% or higher 88% or higher    Row Name 04/18/23 1300             Response to Exercise   Blood Pressure (Admit) 110/60       Blood Pressure (Exit) 118/66       Heart Rate (Admit) 63 bpm       Heart Rate (Exercise) 98 bpm       Heart Rate (Exit) 72 bpm       Rating of Perceived Exertion (Exercise) 13       Perceived Dyspnea (Exercise) 0       Symptoms none       Duration Progress to 30 minutes of  aerobic without signs/symptoms of physical distress       Intensity THRR unchanged         Progression   Progression Continue to progress workloads to maintain intensity without signs/symptoms of physical  distress.       Average METs 2.5         Resistance Training   Training Prescription Yes       Weight 3 lb       Reps 10-15         Interval Training   Interval Training No         Recumbant Bike   Level 2       Watts 18       Minutes 15         NuStep   Level 2       Minutes 15       METs 3         T5 Nustep   Level 2       Minutes 15       METs 2.5         Home Exercise Plan   Plans to continue exercise at Home (comment)  peddle machine       Frequency Add 2 additional days to program exercise sessions.       Initial Home Exercises Provided 04/16/23         Oxygen   Maintain Oxygen Saturation 88% or higher                Exercise Comments:   Exercise Comments     Row Name 02/19/23 1146 04/16/23 1155         Exercise Comments First full day of exercise!  Patient was oriented to gym and equipment including functions, settings, policies, and procedures.  Patient's individual  exercise prescription and treatment plan were reviewed.  All starting workloads were established based on the results of the 6 minute walk test done at initial orientation visit.  The plan for exercise progression was also introduced and progression will be customized based on patient's performance and goals. Patient did decrease his 6 MWT post distance. He stated that he feels stronger with his heart but that his back his back problems were what was limiting him from walking further.               Exercise Goals and Review:   Exercise Goals     Row Name 02/14/23 1356             Exercise Goals   Increase Physical Activity Yes       Intervention Provide advice, education, support and counseling about physical activity/exercise needs.;Develop an individualized exercise prescription for aerobic and resistive training based on initial evaluation findings, risk stratification, comorbidities and participant's personal goals.       Expected Outcomes Short Term: Attend rehab on a  regular basis to increase amount of physical activity.;Long Term: Add in home exercise to make exercise part of routine and to increase amount of physical activity.;Long Term: Exercising regularly at least 3-5 days a week.       Increase Strength and Stamina Yes       Intervention Provide advice, education, support and counseling about physical activity/exercise needs.;Develop an individualized exercise prescription for aerobic and resistive training based on initial evaluation findings, risk stratification, comorbidities and participant's personal goals.       Expected Outcomes Short Term: Increase workloads from initial exercise prescription for resistance, speed, and METs.;Short Term: Perform resistance training exercises routinely during rehab and add in resistance training at home;Long Term: Improve cardiorespiratory fitness, muscular endurance and strength as measured by increased METs and functional capacity ( )       Able to understand and use rate of perceived exertion (RPE) scale Yes       Intervention Provide education and explanation on how to use RPE scale       Expected Outcomes Short Term: Able to use RPE daily in rehab to express subjective intensity level;Long Term:  Able to use RPE to guide intensity level when exercising independently       Able to understand and use Dyspnea scale Yes       Intervention Provide education and explanation on how to use Dyspnea scale       Expected Outcomes Short Term: Able to use Dyspnea scale daily in rehab to express subjective sense of shortness of breath during exertion;Long Term: Able to use Dyspnea scale to guide intensity level when exercising independently       Knowledge and understanding of Target Heart Rate Range (THRR) Yes       Intervention Provide education and explanation of THRR including how the numbers were predicted and where they are located for reference       Expected Outcomes Short Term: Able to state/look up THRR;Long Term:  Able to use THRR to govern intensity when exercising independently;Short Term: Able to use daily as guideline for intensity in rehab       Able to check pulse independently Yes       Intervention Provide education and demonstration on how to check pulse in carotid and radial arteries.;Review the importance of being able to check your own pulse for safety during independent exercise       Expected Outcomes Short Term:  Able to explain why pulse checking is important during independent exercise;Long Term: Able to check pulse independently and accurately       Understanding of Exercise Prescription Yes       Intervention Provide education, explanation, and written materials on patient's individual exercise prescription       Expected Outcomes Short Term: Able to explain program exercise prescription;Long Term: Able to explain home exercise prescription to exercise independently                Exercise Goals Re-Evaluation :  Exercise Goals Re-Evaluation     Row Name 02/19/23 1146 03/06/23 0746 03/21/23 1103 04/03/23 0748 04/16/23 1152     Exercise Goal Re-Evaluation   Exercise Goals Review Able to understand and use rate of perceived exertion (RPE) scale;Able to understand and use Dyspnea scale;Knowledge and understanding of Target Heart Rate Range (THRR);Understanding of Exercise Prescription Increase Physical Activity;Increase Strength and Stamina;Understanding of Exercise Prescription Increase Physical Activity;Increase Strength and Stamina;Understanding of Exercise Prescription Increase Physical Activity;Increase Strength and Stamina;Understanding of Exercise Prescription Increase Physical Activity;Increase Strength and Stamina;Able to understand and use rate of perceived exertion (RPE) scale;Able to understand and use Dyspnea scale;Knowledge and understanding of Target Heart Rate Range (THRR);Able to check pulse independently;Understanding of Exercise Prescription   Comments Reviewed RPE and  dyspnea scale, THR and program prescription with pt today.  Pt voiced understanding and was given a copy of goals to take home. Marc Gray is off to a good start in the program. He recently completed his first 2 weeks of exercise. During his first few sessions he was able to increase his level on the T4 nustep to level 4, and increase his level on the recumbent bike to level 4. We will continue to monitor his progress in the program. Marc Gray is doing well in the program. He continues to work at level 2 on the recumbent bike, T4 nustep, T5 nustep and level 1 on the biostep. He has not done any walking in the program since the last review, but has done well with 3 lb hand weights for resistance training. We will continue to monitor his progress in the program. Marc Gray continues to do well in the program. He has recently been able to increase his level on the biostep from level 1 to 2. He has also been able to maintain an intensity of level 2 on the recumbent bike and T4 nustep. We will continue to monitor his progress in the program. Reviewed home exercise with pt today.  Pt plans to use peddle machine for exercise.  Reviewed THR, pulse, RPE, sign and symptoms, pulse oximetery and when to call 911 or MD.  Also discussed weather considerations and indoor options.  Pt voiced understanding.Patient did decrease his 6 MWT post distance. He stated that he feels stronger with his heart but that his back his back problems were what was limiting him from walking further.   Expected Outcomes Short: Use RPE daily to regulate intensity. Long: Follow program prescription in THR. Short: Continue to follow current exercise prescription. Long: Continue exercise to improve strength and stamina. Short: Begin walking the track as part of exercise prescription. Long: Continue exercise to improve strength and stamina. Short: Begin walking on the track, increase level on the T5 nustep to level 2. Long: Continue exercise to improve strength and  stamina Short: add 1-2 days a week of home exericise on off days of rehab. Graduate from cardiac rehab. Long: continue with independent exercise program upon graduation from cardiac  rehab.    Row Name 04/18/23 1328             Exercise Goal Re-Evaluation   Exercise Goals Review Increase Physical Activity;Increase Strength and Stamina;Understanding of Exercise Prescription       Comments Marc Gray continues to do well in rehab. He was recently able to increase his level on the T5 nustep from level 1 to level 2. He was also able to maintain an intensity of level 2 on the T4 nustep and recumbent bike.       Expected Outcomes Short: Try increasing to level 3 on the T4 nustep, and recumbent bike. Long: Continue exercise to improve strength and stamina.                Discharge Exercise Prescription (Final Exercise Prescription Changes):  Exercise Prescription Changes - 04/18/23 1300       Response to Exercise   Blood Pressure (Admit) 110/60    Blood Pressure (Exit) 118/66    Heart Rate (Admit) 63 bpm    Heart Rate (Exercise) 98 bpm    Heart Rate (Exit) 72 bpm    Rating of Perceived Exertion (Exercise) 13    Perceived Dyspnea (Exercise) 0    Symptoms none    Duration Progress to 30 minutes of  aerobic without signs/symptoms of physical distress    Intensity THRR unchanged      Progression   Progression Continue to progress workloads to maintain intensity without signs/symptoms of physical distress.    Average METs 2.5      Resistance Training   Training Prescription Yes    Weight 3 lb    Reps 10-15      Interval Training   Interval Training No      Recumbant Bike   Level 2    Watts 18    Minutes 15      NuStep   Level 2    Minutes 15    METs 3      T5 Nustep   Level 2    Minutes 15    METs 2.5      Home Exercise Plan   Plans to continue exercise at Home (comment)   peddle machine   Frequency Add 2 additional days to program exercise sessions.    Initial Home  Exercises Provided 04/16/23      Oxygen   Maintain Oxygen Saturation 88% or higher             Nutrition:  Target Goals: Understanding of nutrition guidelines, daily intake of sodium 1500mg , cholesterol 200mg , calories 30% from fat and 7% or less from saturated fats, daily to have 5 or more servings of fruits and vegetables.  Education: All About Nutrition: -Group instruction provided by verbal, written material, interactive activities, discussions, models, and posters to present general guidelines for heart healthy nutrition including fat, fiber, MyPlate, the role of sodium in heart healthy nutrition, utilization of the nutrition label, and utilization of this knowledge for meal planning. Follow up email sent as well. Written material given at graduation. Flowsheet Row Cardiac Rehab from 04/11/2023 in [facility] Cardiac and Pulmonary Rehab  Date 04/11/23  Educator JG  Instruction Review Code 1- Verbalizes Understanding       Biometrics:  Pre Biometrics - 02/14/23 1357       Pre Biometrics   Height 6' (1.829 m)    Weight 249 lb 12.8 oz (113.3 kg)    Waist Circumference 52 inches    Hip Circumference 48 inches  Waist to Hip Ratio 1.08 %    BMI (Calculated) 33.87    Single Leg Stand 7.03 seconds             Post Biometrics - 04/16/23 1130        Post  Biometrics   Height 6' (1.829 m)    Weight 247 lb 1.6 oz (112.1 kg)    Waist Circumference 51.5 inches    Hip Circumference 47.5 inches    Waist to Hip Ratio 1.08 %    BMI (Calculated) 33.51    Single Leg Stand 3.62 seconds             Nutrition Therapy Plan and Nutrition Goals:  Nutrition Therapy & Goals - 02/14/23 1335       Nutrition Therapy   Diet Cardiac, Low Na    Protein (specify units) 90    Fiber 30 grams    Whole Grain Foods 3 servings    Saturated Fats 15 max. grams    Fruits and Vegetables 5 servings/day    Sodium 2 grams      Personal Nutrition Goals   Nutrition Goal Drink 3 bottles of  water daily    Personal Goal #2 Use healthy fats to promote hearth health, but watch calories    Personal Goal #3 Pair carb with protein at meal    Comments Patient drinking 2 bottles of water, ~32oz daily. Knows he needs to drink more, discussed goal of 3 bottles per day. He reports he tries to eat 3 meals per day. Sometimes eats late in the evening. Educated on [employer] handout, types of fats, sources, and how to read label. His nutrition knowledge was limited but kept things simple and used nutritional food groups sheet as reference to help him build more balanced meals with foods he likes and will focus on smaller portions of meat, leaner meat choices, more colorful produce and complex carbs paired with good protein or healthy fats. Answered questions he had around, saturated fat, sodium, fiber and cholesterol.      Intervention Plan   Intervention Prescribe, educate and counsel regarding individualized specific dietary modifications aiming towards targeted core components such as weight, hypertension, lipid management, diabetes, heart failure and other comorbidities.;Nutrition handout(s) given to patient.    Expected Outcomes Short Term Goal: Understand basic principles of dietary content, such as calories, fat, sodium, cholesterol and nutrients.;Short Term Goal: A plan has been developed with personal nutrition goals set during dietitian appointment.;Long Term Goal: Adherence to prescribed nutrition plan.             Nutrition Assessments:  MEDIFICTS Score Key: >=70 Need to make dietary changes  40-70 Heart Healthy Diet <= 40 Therapeutic Level Cholesterol Diet  Flowsheet Row Cardiac Rehab from 02/14/2023 in [facility] Cardiac and Pulmonary Rehab  Picture Your Plate Total Score on Admission 45      Picture Your Plate Scores: <09 Unhealthy dietary pattern with much room for improvement. 41-50 Dietary pattern unlikely to meet recommendations for good health and room for  improvement. 51-60 More healthful dietary pattern, with some room for improvement.  >60 Healthy dietary pattern, although there may be some specific behaviors that could be improved.    Nutrition Goals Re-Evaluation:  Nutrition Goals Re-Evaluation     Row Name 04/18/23 1138             Goals   Comment Marc Gray has been trying to incoporate what he learned from the RD. He states one of his barriers  is that his wife gets home late from her work and they have a difficult time prepping healthy meals later in the evening. He doesn't feel good if he eats after 6 and would prefer to eat dinner earlier. He knows this isn't always an option, but we discussed meal prepping on nights she has to work later. He mentioned he is not giving up on making changes to his diet and is finding motivation to make those changes each time he comes to class       Expected Outcome Short: attend cardiac rehab for education and motivation. Long; independently manage heart healty eating.                Nutrition Goals Discharge (Final Nutrition Goals Re-Evaluation):  Nutrition Goals Re-Evaluation - 04/18/23 1138       Goals   Comment Marc Gray has been trying to incoporate what he learned from the RD. He states one of his barriers is that his wife gets home late from her work and they have a difficult time prepping healthy meals later in the evening. He doesn't feel good if he eats after 6 and would prefer to eat dinner earlier. He knows this isn't always an option, but we discussed meal prepping on nights she has to work later. He mentioned he is not giving up on making changes to his diet and is finding motivation to make those changes each time he comes to class    Expected Outcome Short: attend cardiac rehab for education and motivation. Long; independently manage heart healty eating.             Psychosocial: Target Goals: Acknowledge presence or absence of significant depression and/or stress, maximize coping  skills, provide positive support system. Participant is able to verbalize types and ability to use techniques and skills needed for reducing stress and depression.   Education: Stress, Anxiety, and Depression - Group verbal and visual presentation to define topics covered.  Reviews how body is impacted by stress, anxiety, and depression.  Also discusses healthy ways to reduce stress and to treat/manage anxiety and depression.  Written material given at graduation. Flowsheet Row Cardiac Rehab from 04/11/2023 in [facility] Cardiac and Pulmonary Rehab  Date 03/07/23  Educator SB  Instruction Review Code 1- [employer] Understanding       Education: Sleep Hygiene -Provides group verbal and written instruction about how sleep can affect your health.  Define sleep hygiene, discuss sleep cycles and impact of sleep habits. Review good sleep hygiene tips.    Initial Review & Psychosocial Screening:  Initial Psych Review & Screening - 02/05/23 1028       Initial Review   Current issues with Current Anxiety/Panic      Family Dynamics   Good Support System? Yes    Comments Marc Gray has intra family strains and can get depressed over his daughter that is off of drugs now but doesnt get to see his grandkids. He has his wife, daughter and good friend to look to for support.      Barriers   Psychosocial barriers to participate in program The patient should benefit from training in stress management and relaxation.;There are no identifiable barriers or psychosocial needs.      Screening Interventions   Interventions To provide support and resources with identified psychosocial needs;Provide feedback about the scores to participant;Encouraged to exercise    Expected Outcomes Short Term goal: Utilizing psychosocial counselor, staff and physician to assist with identification of specific Stressors or current  issues interfering with healing process. Setting desired goal for each stressor or current issue  identified.;Long Term Goal: Stressors or current issues are controlled or eliminated.;Short Term goal: Identification and review with participant of any Quality of Life or Depression concerns found by scoring the questionnaire.;Long Term goal: The participant improves quality of Life and PHQ9 Scores as seen by post scores and/or verbalization of changes             Quality of Life Scores:   Quality of Life - 02/14/23 1359       Quality of Life   Select Quality of Life      Quality of Life Scores   Health/Function Pre 15.5 %    Socioeconomic Pre 24.29 %    Psych/Spiritual Pre 24.64 %    Family Pre 25.2 %    GLOBAL Pre 20.62 %            Scores of 19 and below usually indicate a poorer quality of life in these areas.  A difference of  2-3 points is a clinically meaningful difference.  A difference of 2-3 points in the total score of the Quality of Life Index has been associated with significant improvement in overall quality of life, self-image, physical symptoms, and general health in studies assessing change in quality of life.  PHQ-9: Review Flowsheet       02/14/2023 07/02/2017  Depression screen PHQ 2/9  Decreased Interest 1 0  Down, Depressed, Hopeless 0 0  PHQ - 2 Score 1 0  Altered sleeping 1 -  Tired, decreased energy 1 -  Change in appetite 0 -  Feeling bad or failure about yourself  1 -  Trouble concentrating 0 -  Moving slowly or fidgety/restless 0 -  Suicidal thoughts 0 -  PHQ-9 Score 4 -  Difficult doing work/chores Somewhat difficult -    Details           Interpretation of Total Score  Total Score Depression Severity:  1-4 = Minimal depression, 5-9 = Mild depression, 10-14 = Moderate depression, 15-19 = Moderately severe depression, 20-27 = Severe depression   Psychosocial Evaluation and Intervention:  Psychosocial Evaluation - 02/05/23 1032       Psychosocial Evaluation & Interventions   Interventions Encouraged to exercise with the program  and follow exercise prescription;Stress management education;Relaxation education    Comments Marc Gray has intra family strains and can get depressed over his daughter that is off of drugs now but doesnt get to see his grandkids. He has his wife, daughter and good friend to look to for support.    Expected Outcomes Short: Start [facility] to help with mood. Long: Maintain a healthy mental state    Continue Psychosocial Services  Follow up required by staff             Psychosocial Re-Evaluation:  Psychosocial Re-Evaluation     Row Name 03/26/23 1130 04/18/23 1132           Psychosocial Re-Evaluation   Current issues with Current Stress Concerns;Current Sleep Concerns Current Stress Concerns      Comments Marc Gray is enjoying coming to cardiac rehab, however it does cause some anxiety the nights before making it hard to sleep. He knows he is getitng older and is scared something will happen while he is exercising. He hasn't had chest pain like he did prior to his MI, but sometimes he gets these episodes of chest discomfort. He plans to discuss these concerns with his doctor. He  felt reassured when staff went over his progress in the short time he has been here and he feels motivated to continue coming to see more progress. His wife runs an after school care program, so he doesn't do too much when he is by himself. He enjoys watching football, but feels like he doesn't do too much outside of that. He wants to find something more active to do so he is glad he is involved in the program. He wants to meet with his doctor to discuss his health concerns and is making a list of questions for him. Marc Gray is still enjoying coming to the program. He states some of his anxiety the night before has eased since coming consistently to the program. He reports sleeping better than he has and he is using his sleep medication appropriately. He went to the hospital last week for chest pain and was given the all clear. This  was  relief to him, but also frustrating that no one could tell him why he was having pain. He follows up with his doctor regularly and we discussed importance of going to the hospital in those situations is appropriate, even if they don't find anything. He and his wife still are not able to talk to their kids or grandkids like they want, which is extra sad to him during the current holiday season. He hopes that those involved will come to their senses, and knows that a lot of it is out of his control so he is trying to stay focused on the positive things happening in his life. He has been able to text on of his older granddaughters which he was happy about. He and his wife are each other's support system and they hope their kids will return to their inner circle one day. He wants to stay active because he can tell that this program has helped him physically and mentally. We discussed plans after graduation and he will discuss the [facility] with his wife soon.      Expected Outcomes Short: attend cardiac rehab for education and exercise. Long; find and maintain positive self care hobbies Short: continue attending cardiac rehab for education and exercise. Long: maintain positive sleep habits and stay focused on the things in his control.      Interventions -- Encouraged to attend Cardiac Rehabilitation for the exercise;Stress management education      Continue Psychosocial Services  Follow up required by staff Follow up required by staff               Psychosocial Discharge (Final Psychosocial Re-Evaluation):  Psychosocial Re-Evaluation - 04/18/23 1132       Psychosocial Re-Evaluation   Current issues with Current Stress Concerns    Comments Marc Gray is still enjoying coming to the program. He states some of his anxiety the night before has eased since coming consistently to the program. He reports sleeping better than he has and he is using his sleep medication appropriately. He went to the hospital last  week for chest pain and was given the all clear. This was  relief to him, but also frustrating that no one could tell him why he was having pain. He follows up with his doctor regularly and we discussed importance of going to the hospital in those situations is appropriate, even if they don't find anything. He and his wife still are not able to talk to their kids or grandkids like they want, which is extra sad to him during the current  holiday season. He hopes that those involved will come to their senses, and knows that a lot of it is out of his control so he is trying to stay focused on the positive things happening in his life. He has been able to text on of his older granddaughters which he was happy about. He and his wife are each other's support system and they hope their kids will return to their inner circle one day. He wants to stay active because he can tell that this program has helped him physically and mentally. We discussed plans after graduation and he will discuss the [facility] with his wife soon.    Expected Outcomes Short: continue attending cardiac rehab for education and exercise. Long: maintain positive sleep habits and stay focused on the things in his control.    Interventions Encouraged to attend Cardiac Rehabilitation for the exercise;Stress management education    Continue Psychosocial Services  Follow up required by staff             Vocational Rehabilitation: Provide vocational rehab assistance to qualifying candidates.   Vocational Rehab Evaluation & Intervention:  Vocational Rehab - 04/18/23 1132       Initial Vocational Rehab Evaluation & Intervention   Assessment shows need for Vocational Rehabilitation No             Education: Education Goals: Education classes will be provided on a variety of topics geared toward better understanding of heart health and risk factor modification. Participant will state understanding/return demonstration of topics presented  as noted by education test scores.  Learning Barriers/Preferences:  Learning Barriers/Preferences - 02/05/23 1028       Learning Barriers/Preferences   Learning Barriers None    Learning Preferences None             General Cardiac Education Topics:  AED/CPR: - Group verbal and written instruction with the use of models to demonstrate the basic use of the AED with the basic ABC's of resuscitation.   Anatomy and Cardiac Procedures: - Group verbal and visual presentation and models provide information about basic cardiac anatomy and function. Reviews the testing methods done to diagnose heart disease and the outcomes of the test results. Describes the treatment choices: Medical Management, Angioplasty, or Coronary Bypass Surgery for treating various heart conditions including Myocardial Infarction, Angina, Valve Disease, and Cardiac Arrhythmias.  Written material given at graduation. Flowsheet Row Cardiac Rehab from 04/11/2023 in [facility] Cardiac and Pulmonary Rehab  Education need identified 02/14/23       Medication Safety: - Group verbal and visual instruction to review commonly prescribed medications for heart and lung disease. Reviews the medication, class of the drug, and side effects. Includes the steps to properly store meds and maintain the prescription regimen.  Written material given at graduation.   Intimacy: - Group verbal instruction through game format to discuss how heart and lung disease can affect sexual intimacy. Written material given at graduation..   Know Your Numbers and Heart Failure: - Group verbal and visual instruction to discuss disease risk factors for cardiac and pulmonary disease and treatment options.  Reviews associated critical values for Overweight/Obesity, Hypertension, Cholesterol, and Diabetes.  Discusses basics of heart failure: signs/symptoms and treatments.  Introduces Heart Failure Zone chart for action plan for heart failure.  Written  material given at graduation. Flowsheet Row Cardiac Rehab from 04/11/2023 in [facility] Cardiac and Pulmonary Rehab  Education need identified 02/14/23  Date 02/28/23  Educator SB  Instruction Review Code 1- [employer]  Understanding       Infection Prevention: - Provides verbal and written material to individual with discussion of infection control including proper hand washing and proper equipment cleaning during exercise session. Flowsheet Row Cardiac Rehab from 04/11/2023 in [facility] Cardiac and Pulmonary Rehab  Date 02/14/23  Educator [facility]  Instruction Review Code 1- Verbalizes Understanding       Falls Prevention: - Provides verbal and written material to individual with discussion of falls prevention and safety. Flowsheet Row Cardiac Rehab from 04/11/2023 in [facility] Cardiac and Pulmonary Rehab  Date 02/14/23  Educator [facility]  Instruction Review Code 1- Verbalizes Understanding       Other: -Provides group and verbal instruction on various topics (see comments)   Knowledge Questionnaire Score:  Knowledge Questionnaire Score - 02/14/23 1358       Knowledge Questionnaire Score   Pre Score 21/26             Core Components/Risk Factors/Patient Goals at Admission:  Personal Goals and Risk Factors at Admission - 02/14/23 1359       Core Components/Risk Factors/Patient Goals on Admission    Weight Management Yes;Weight Loss    Intervention Weight Management: Develop a combined nutrition and exercise program designed to reach desired caloric intake, while maintaining appropriate intake of nutrient and fiber, sodium and fats, and appropriate energy expenditure required for the weight goal.;Weight Management: Provide education and appropriate resources to help participant work on and attain dietary goals.;Weight Management/Obesity: Establish reasonable short term and long term weight goals.;Obesity: Provide education and appropriate resources to help participant work on and attain  dietary goals.    Admit Weight 249 lb 12.8 oz (113.3 kg)    Goal Weight: Short Term 240 lb (108.9 kg)    Goal Weight: Long Term 185 lb (83.9 kg)    Expected Outcomes Short Term: Continue to assess and modify interventions until short term weight is achieved;Long Term: Adherence to nutrition and physical activity/exercise program aimed toward attainment of established weight goal;Understanding recommendations for meals to include 15-35% energy as protein, 25-35% energy from fat, 35-60% energy from carbohydrates, less than 200mg  of dietary cholesterol, 20-35 gm of total fiber daily;Understanding of distribution of calorie intake throughout the day with the consumption of 4-5 meals/snacks;Weight Loss: Understanding of general recommendations for a balanced deficit meal plan, which promotes 1-2 lb weight loss per week and includes a negative energy balance of [phone] kcal/d    Heart Failure Yes    Intervention Provide a combined exercise and nutrition program that is supplemented with education, support and counseling about heart failure. Directed toward relieving symptoms such as shortness of breath, decreased exercise tolerance, and extremity edema.    Expected Outcomes Improve functional capacity of life;Short term: Attendance in program 2-3 days a week with increased exercise capacity. Reported lower sodium intake. Reported increased fruit and vegetable intake. Reports medication compliance.;Short term: Daily weights obtained and reported for increase. Utilizing diuretic protocols set by physician.;Long term: Adoption of self-care skills and reduction of barriers for early signs and symptoms recognition and intervention leading to self-care maintenance.    Hypertension Yes    Intervention Provide education on lifestyle modifcations including regular physical activity/exercise, weight management, moderate sodium restriction and increased consumption of fresh fruit, vegetables, and low fat dairy, alcohol  moderation, and smoking cessation.;Monitor prescription use compliance.    Expected Outcomes Short Term: Continued assessment and intervention until BP is < 140/36mm HG in hypertensive participants. < 130/28mm HG in hypertensive participants with diabetes, heart failure  or chronic kidney disease.;Long Term: Maintenance of blood pressure at goal levels.    Lipids Yes    Intervention Provide education and support for participant on nutrition & aerobic/resistive exercise along with prescribed medications to achieve LDL 70mg , HDL >40mg .    Expected Outcomes Short Term: Participant states understanding of desired cholesterol values and is compliant with medications prescribed. Participant is following exercise prescription and nutrition guidelines.;Long Term: Cholesterol controlled with medications as prescribed, with individualized exercise RX and with personalized nutrition plan. Value goals: LDL < 70mg , HDL > 40 mg.             Education:Diabetes - Individual verbal and written instruction to review signs/symptoms of diabetes, desired ranges of glucose level fasting, after meals and with exercise. Acknowledge that pre and post exercise glucose checks will be done for 3 sessions at entry of program.   Core Components/Risk Factors/Patient Goals Review:   Goals and Risk Factor Review     Row Name 03/26/23 1111 04/18/23 1127           Core Components/Risk Factors/Patient Goals Review   Personal Goals Review Weight Management/Obesity;Heart Failure Heart Failure;Lipids;Weight Management/Obesity;Hypertension      Review Marc Gray has enjoyed the program so far. He has been staying at the same weight for 2 months. One doctor mentioned some fluid in his ankles, so he is making sure he is taking his lasix approrpirately. He sees his cardiologist in a few weeks and plans to ask him questions related to heart health. He had gained weight after taking prednisone as part of his cancer treatment, so he feels  like he is trying to fight a losing battle. He has met with the dietician and is trying to implement those things with his wife's help. Marc Gray went to his cardiologist appointment a few weeks ago. He was told his cholesterol looked stable and to keep taking his medication. He asked questions about his fluid managment and was given more direction on his lasix. He is contiuing to weigh himself daily and knows it will be a longer process than he wants to lose weight after gaining with the cancer treatment. He is trying to add in more exercise on his off days so he can feel more confident in exercising on his own and helping his weight loss.He does not take his blood pressure regularly at home because he feels like he gets it checked enough while in the program 3 times a week and all his doctor appointments. We discussed importance of checking blood pressure at home, especially once he graduates from the program      Expected Outcomes Short: attend cardiac rehab for education and exercise. Long: Independently manage risk factors Short:continue weighing daily and start checking his blood pressure on his off days from the program. Long: manage risk factors independently               Core Components/Risk Factors/Patient Goals at Discharge (Final Review):   Goals and Risk Factor Review - 04/18/23 1127       Core Components/Risk Factors/Patient Goals Review   Personal Goals Review Heart Failure;Lipids;Weight Management/Obesity;Hypertension    Review Marc Gray went to his cardiologist appointment a few weeks ago. He was told his cholesterol looked stable and to keep taking his medication. He asked questions about his fluid managment and was given more direction on his lasix. He is contiuing to weigh himself daily and knows it will be a longer process than he wants to lose weight after gaining with  the cancer treatment. He is trying to add in more exercise on his off days so he can feel more confident in exercising on  his own and helping his weight loss.He does not take his blood pressure regularly at home because he feels like he gets it checked enough while in the program 3 times a week and all his doctor appointments. We discussed importance of checking blood pressure at home, especially once he graduates from the program    Expected Outcomes Short:continue weighing daily and start checking his blood pressure on his off days from the program. Long: manage risk factors independently             ITP Comments:  ITP Comments     Row Name 02/05/23 1026 02/14/23 1348 02/19/23 1146 02/28/23 1223 03/28/23 1314   ITP Comments Virtual Visit completed. Patient informed on EP and RD appointment and 6 Minute walk test. Patient also informed of patient health questionnaires on My Chart. Patient Verbalizes understanding. Visit diagnosis can be found in [facility]. Completed and gym orientation. Initial ITP created and sent for review to Dr. Bethann Punches, Medical Director. First full day of exercise!  Patient was oriented to gym and equipment including functions, settings, policies, and procedures.  Patient's individual exercise prescription and treatment plan were reviewed.  All starting workloads were established based on the results of the 6 minute walk test done at initial orientation visit.  The plan for exercise progression was also introduced and progression will be customized based on patient's performance and goals. 30 Day review completed. Medical Director ITP review done, changes made as directed, and signed approval by Medical Director.    new to program 30 Day review completed. Medical Director ITP review done, changes made as directed, and signed approval by Medical Director.    Row Name 04/18/23 1014 04/23/23 1323 04/30/23 1126       ITP Comments 30 Day review completed. Medical Director ITP review done, changes made as directed, and signed approval by Medical Director. 30 Day review completed. Medical  Director ITP review done, changes made as directed, and signed approval by Medical Director. Santana graduated today from  rehab with 36 sessions completed.  Details of the patient's exercise prescription and what He needs to do in order to continue the prescription and progress were discussed with patient.  Patient was given a copy of prescription and goals.  Patient verbalized understanding. Ziah plans to continue to exercise by using a peddle machine.              Comments: Discharge ITP

## 2023-04-30 NOTE — Progress Notes (Signed)
Discharge Summary:  Marc Gray  (DOB: [birth year])  Marc Gray graduated today from  rehab with 36 sessions completed.  Details of the patient's exercise prescription and what He needs to do in order to continue the prescription and progress were discussed with patient.  Patient was given a copy of prescription and goals.  Patient verbalized understanding. Marc Gray plans to continue to exercise by using a peddle machine.   6 Minute Walk     Row Name 02/14/23 1350 04/16/23 1129       6 Minute Walk   Phase Initial Discharge    Distance 690 feet 570 feet    Distance % Change -- -17 %    Distance Feet Change -- -120 ft    Walk Time 6 minutes 5.25 minutes    # of Rest Breaks 0 1    MPH 1.31 1.23    METS 1.34 1.1    RPE 15 12    Perceived Dyspnea  3 3    VO2 Peak 4.68 3.85    Symptoms Yes (comment) Yes (comment)    Comments back pain while walking 8/10 relieved with rest. SOB 3 back pain while walking 8/10 relieved with rest. SOB 3    Resting HR 61 bpm 82 bpm    Resting BP 138/70 122/70    Resting Oxygen Saturation  96 % 94 %    Exercise Oxygen Saturation  during 6 min walk 97 % 92 %    Max Ex. HR 109 bpm 107 bpm    Max Ex. BP 142/80 138/62    2 Minute Post BP 130/78 128/82

## 2023-04-30 NOTE — Progress Notes (Signed)
Daily Session Note  Patient Details  Name: Marc Gray MRN: [id number] Date of Birth: 02-Feb-1946 Referring Provider:   Flowsheet Row Cardiac Rehab from 02/14/2023 in [facility]  Referring Provider Dr. Marcina Millard       Encounter Date: 04/30/2023  Check In:  Session Check In - 04/30/23 1124       Check-In   Supervising physician immediately available to respond to emergencies See telemetry face sheet for immediately available ER MD    Location [facility]-Cardiac & Pulmonary Rehab    Staff Present Rory Percy, MS, Exercise Physiologist;Laureen Manson Passey, BS, RRT, CPFT;Kelly Madilyn Fireman, BS, ACSM CEP, Exercise Physiologist;Ash Mcelwain Katrinka Blazing, RN, ADN    Virtual Visit No    Medication changes reported     No    Fall or balance concerns reported    No    Warm-up and Cool-down Performed on first and last piece of equipment    Resistance Training Performed Yes    VAD Patient? No    PAD/SET Patient? No      Pain Assessment   Currently in Pain? No/denies                Social History   Tobacco Use  Smoking Status Former   Current packs/day: 0.00   Average packs/day: 1 pack/day for 53.0 years (53.0 ttl pk-yrs)   Types: Cigarettes   Start date: 04/14/1964   Quit date: 04/14/2017   Years since quitting: 6.0   Passive exposure: Past  Smokeless Tobacco Never  Tobacco Comments   Quit November 2018    Goals Met:  Independence with exercise equipment Exercise tolerated well No report of concerns or symptoms today Strength training completed today  Goals Unmet:  Not Applicable  Comments:  Marc Gray graduated today from  rehab with 36 sessions completed.  Details of the patient's exercise prescription and what He needs to do in order to continue the prescription and progress were discussed with patient.  Patient was given a copy of prescription and goals.  Patient verbalized understanding. Marc Gray plans to continue to exercise by using a peddle  machine.    Dr. Bethann Punches is Medical Director for [facility].  Dr. Vida Rigger is Medical Director for [facility] Pul[facility].

## 2023-05-01 ENCOUNTER — Inpatient Hospital Stay: Payer: HMO | Attending: Oncology

## 2023-05-01 VITALS — BP 112/72 | HR 63 | Temp 97.5°F | Resp 16

## 2023-05-01 DIAGNOSIS — D509 Iron deficiency anemia, unspecified: Secondary | ICD-10-CM | POA: Insufficient documentation

## 2023-05-01 DIAGNOSIS — E860 Dehydration: Secondary | ICD-10-CM

## 2023-05-01 DIAGNOSIS — C3492 Malignant neoplasm of unspecified part of left bronchus or lung: Secondary | ICD-10-CM

## 2023-05-01 MED ORDER — IRON SUCROSE 20 MG/ML IV SOLN
200.0000 mg | Freq: Once | INTRAVENOUS | Status: AC
  Administered 2023-05-01: 200 mg via INTRAVENOUS
  Filled 2023-05-01: qty 10

## 2023-05-03 ENCOUNTER — Ambulatory Visit (INDEPENDENT_AMBULATORY_CARE_PROVIDER_SITE_OTHER): Payer: HMO | Admitting: Vascular Surgery

## 2023-05-03 ENCOUNTER — Encounter (INDEPENDENT_AMBULATORY_CARE_PROVIDER_SITE_OTHER): Payer: Self-pay | Admitting: Vascular Surgery

## 2023-05-03 VITALS — BP 138/77 | HR 60 | Resp 18 | Ht 73.0 in | Wt 252.0 lb

## 2023-05-03 DIAGNOSIS — R400 Somnolence: Secondary | ICD-10-CM | POA: Diagnosis not present

## 2023-05-03 DIAGNOSIS — G4762 Sleep related leg cramps: Secondary | ICD-10-CM

## 2023-05-03 DIAGNOSIS — I7121 Aneurysm of the ascending aorta, without rupture: Secondary | ICD-10-CM | POA: Diagnosis not present

## 2023-05-03 DIAGNOSIS — I7123 Aneurysm of the descending thoracic aorta, without rupture: Secondary | ICD-10-CM

## 2023-05-03 DIAGNOSIS — N1831 Chronic kidney disease, stage 3a: Secondary | ICD-10-CM

## 2023-05-03 DIAGNOSIS — I739 Peripheral vascular disease, unspecified: Secondary | ICD-10-CM | POA: Diagnosis not present

## 2023-05-03 DIAGNOSIS — I214 Non-ST elevation (NSTEMI) myocardial infarction: Secondary | ICD-10-CM | POA: Diagnosis not present

## 2023-05-03 NOTE — Progress Notes (Signed)
MRN : [id number]  Marc Gray is a 77 y.o. (Jan 16, 1946) male who presents with chief complaint of check circulation.  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 The aneurysm was found incidentally by CT scan. Patient denies chest pain or unusual back pain, no other chest or abdominal complaints.  No history of an abrupt onset of a painful toe associated with blue discoloration.     No family history of TAA/AAA.    The patient is also seen for evaluation of painful lower extremities and diminished pulses. Patient notes the pain is always associated with activity and is very consistent day today. Typically, the pain occurs at less than one block, progress is as activity continues to the point that the patient must stop walking. Resting including standing still for several minutes allows the patient to walk a similar distance before being forced to stop again. Uneven terrain and inclines shorten the distance. The pain has been progressive over the past several years. The patient denies any abrupt changes in claudication symptoms.  The patient states the inability to walk is causing problems with daily activities.  The patient denies rest pain or dangling of an extremity off the side of the bed during the night for relief. No open wounds or sores at this time. No prior interventions or surgeries.  No history of back problems or DJD of the lumbar sacral spine.   The patient is also c/o painful lower extremities because of cramps. The leg pain occurs primarily at night while the patient is in bed.   The patient describes it as a cramping or Charley horse type pain. The patient notes the pain isn't associated with activity and is not very consistent day to day. The pain seems to be variable with time. Typically the pain occurs with varying  positions and seems to progress until the leg is stretched. The pain has been progressive over the past several years which has prompted the concern for evaluation. The patient states this inability to walk has a significant negative impact on her quality of life and daily activities.  Patient denies amaurosis fugax or TIA symptoms.   The patient denies angina or shortness of breath.  CT scan dated 03/22/2023, shows an ascending TAA that measures 4.1 cm and a descending thoracic aneurysm that measures 3.1 cm.  Of note, there is no evidence for an abdominal aortic aneurysm.  Mild to moderate atherosclerotic changes are noted but no aneurysmal dilatation.  Current Meds  Medication Sig   acetaminophen (TYLENOL) 500 MG tablet Take 1,000 mg by mouth every 6 (six) hours as needed for moderate pain.   amphetamine-dextroamphetamine (ADDERALL) 20 MG tablet Take by mouth.   aspirin EC 81 MG tablet Take 1 tablet (81 mg total) by mouth daily. Swallow whole.   Azelastine HCl 137 MCG/SPRAY SOLN SMARTSIG:1-2 Spray(s) Both Nares Twice Daily   BREZTRI AEROSPHERE 160-9-4.8 MCG/ACT AERO Inhale 2 puffs into the lungs 2 (two) times daily.   calcium-vitamin D (OSCAL WITH D) 500-200 MG-UNIT tablet  Take 1 tablet by mouth daily.   diphenhydrAMINE-zinc acetate (BENADRYL EXTRA STRENGTH) cream Apply 1 application topically 3 (three) times daily as needed for itching.   DUPIXENT 300 MG/2ML SOPN Inject 300 mg into the skin every 14 (fourteen) days.   EPINEPHrine 0.3 mg/0.3 mL IJ SOAJ injection Inject 0.3 mg into the muscle as needed.   fluticasone (FLONASE) 50 MCG/ACT nasal spray Place 1 spray into both nostrils daily.   furosemide (LASIX) 20 MG tablet Take by mouth.   Ipratropium-Albuterol (COMBIVENT) 20-100 MCG/ACT AERS respimat Inhale into the lungs.   levothyroxine (SYNTHROID) 137 MCG tablet Take by mouth.   Melatonin 10 MG TABS Take 10 mg by mouth at bedtime.    metoprolol tartrate (LOPRESSOR) 25 MG tablet Take 0.5  tablets (12.5 mg total) by mouth 2 (two) times daily.   montelukast (SINGULAIR) 10 MG tablet Take 10 mg by mouth daily.   Multiple Vitamin (MULTI-VITAMINS) TABS Take 1 tablet by mouth daily.    pantoprazole (PROTONIX) 40 MG tablet Take 1 tablet (40 mg total) by mouth daily.   polyethylene glycol-electrolytes (NULYTELY) 420 g solution MIX AND DRINK AS DIRECTED   prasugrel (EFFIENT) 10 MG TABS tablet Take 1 tablet (10 mg total) by mouth daily.   predniSONE (DELTASONE) 5 MG tablet Take 5 mg by mouth daily.   prochlorperazine (COMPAZINE) 10 MG tablet Take 1 tablet (10 mg total) by mouth every 6 (six) hours as needed for nausea or vomiting.   rosuvastatin (CRESTOR) 10 MG tablet    SHINGRIX injection    SPIKEVAX syringe    tadalafil (CIALIS) 5 MG tablet Take 5-10mg  by mouth as needed prior to intercourse   tamsulosin (FLOMAX) 0.4 MG CAPS capsule Take 1 capsule (0.4 mg total) by mouth daily.   traZODone (DESYREL) 100 MG tablet Take 100 mg by mouth at bedtime.   triamcinolone ointment (KENALOG) 0.5 % Apply 1 application topically 3 (three) times daily as needed.    Past Medical History:  Diagnosis Date   Anxiety    Aortic atherosclerosis (HCC)    Arthritis    SHOULDER   BPH (benign prostatic hyperplasia)    Cataract    Chronic kidney disease    low kidney function   Colon adenomas    Colon cancer (HCC) 2015   Pt states during his colonoscopy he had cancerous polyps removed.    COPD (chronic obstructive pulmonary disease) (HCC)    Depression    SINCE DIAGNOSIS   Dyspnea    DOE   Enlarged prostate    GERD (gastroesophageal reflux disease)    Hypertension    Hypothyroidism    Lung cancer (HCC)    Aug 2018   Pain    DUE TO LUNG ISSUE    Past Surgical History:  Procedure Laterality Date   BACK SURGERY     kyphoplasty  T5-6   CATARACT EXTRACTION W/ INTRAOCULAR LENS  IMPLANT, BILATERAL     COLONOSCOPY     COLONOSCOPY WITH PROPOFOL N/A 12/17/2019   Procedure: COLONOSCOPY WITH  PROPOFOL;  Surgeon: Earline Mayotte, MD;  Location: [facility];  Service: Endoscopy;  Laterality: N/A;   CORONARY STENT INTERVENTION N/A 12/29/2022   Procedure: CORONARY STENT INTERVENTION;  Surgeon: Marcina Millard, MD;  Location: [facility];  Service: Cardiovascular;  Laterality: N/A;   EYE SURGERY Bilateral    cataract   LEFT HEART CATH AND CORONARY ANGIOGRAPHY N/A 12/29/2022   Procedure: LEFT HEART CATH AND CORONARY ANGIOGRAPHY;  Surgeon: Marcina Millard, MD;  Location: [facility];  Service: Cardiovascular;  Laterality: N/A;   LYMPH GLAND EXCISION N/A 02/09/2017   Procedure: CERVICAL LYMPH NODE BIOPSY;  Surgeon: Earline Mayotte, MD;  Location: [facility];  Service: General;  Laterality: N/A;   PORTACATH PLACEMENT Right 02/23/2017   Procedure: INSERTION PORT-A-CATH;  Surgeon: Earline Mayotte, MD;  Location: [facility];  Service: General;  Laterality: Right;   PROSTATE SURGERY     TONSILLECTOMY     TRANSURETHRAL RESECTION OF PROSTATE N/A 02/14/2019   Procedure: TRANSURETHRAL RESECTION OF THE PROSTATE (TURP);  Surgeon: Sondra Come, MD;  Location: [facility];  Service: Urology;  Laterality: N/A;    Social History Social History   Tobacco Use   Smoking status: Former    Current packs/day: 0.00    Average packs/day: 1 pack/day for 53.0 years (53.0 ttl pk-yrs)    Types: Cigarettes    Start date: 04/14/1964    Quit date: 04/14/2017    Years since quitting: 6.0    Passive exposure: Past   Smokeless tobacco: Never   Tobacco comments:    Quit November 2018  Vaping Use   Vaping status: Never Used  Substance Use Topics   Alcohol use: Not Currently    Comment: occassional   Drug use: No    Family History Family History  Problem Relation Age of Onset   Breast cancer Maternal Grandmother    Dementia Mother    Diabetes Father    Stroke Father     Allergies  Allergen Reactions   Morphine And Codeine      REVIEW OF SYSTEMS (Negative unless  checked)  Constitutional: [] Weight loss  [] Fever  [] Chills Cardiac: [] Chest pain   [] Chest pressure   [] Palpitations   [] Shortness of breath when laying flat   [] Shortness of breath with exertion. Vascular:  [x] Pain in legs with walking   [] Pain in legs at rest  [] History of DVT   [] Phlebitis   [] Swelling in legs   [] Varicose veins   [] Non-healing ulcers Pulmonary:   [] Uses home oxygen   [] Productive cough   [] Hemoptysis   [] Wheeze  [] COPD   [] Asthma Neurologic:  [] Dizziness   [] Seizures   [] History of stroke   [] History of TIA  [] Aphasia   [] Vissual changes   [] Weakness or numbness in arm   [] Weakness or numbness in leg Musculoskeletal:   [] Joint swelling   [] Joint pain   [] Low back pain Hematologic:  [] Easy bruising  [] Easy bleeding   [] Hypercoagulable state   [] Anemic Gastrointestinal:  [] Diarrhea   [] Vomiting  [] Gastroesophageal reflux/heartburn   [] Difficulty swallowing. Genitourinary:  [] Chronic kidney disease   [] Difficult urination  [] Frequent urination   [] Blood in urine Skin:  [] Rashes   [] Ulcers  Psychological:  [] History of anxiety   []  History of major depression.  Physical Examination  Vitals:   05/03/23 1326  BP: 138/77  Pulse: 60  Resp: 18  Weight: 252 lb (114.3 kg)  Height: 6\' 1"  (1.854 m)   Body mass index is 33.25 kg/m. Gen: WD/WN, NAD Head: /AT, No temporalis wasting.  Ear/Nose/Throat: Hearing grossly intact, nares w/o erythema or drainage Eyes: PER, EOMI, sclera nonicteric.  Neck: Supple, no masses.  No bruit or JVD.  Pulmonary:  Good air movement, no audible wheezing, no use of accessory muscles.  Cardiac: RRR, normal S1, S2, no Murmurs. Vascular:  mild trophic changes, no open wounds Vessel Right Left  Radial Palpable Palpable  PT Not Palpable Not Palpable  DP Not Palpable Not  Palpable  Gastrointestinal: soft, non-distended. No guarding/no peritoneal signs.  Musculoskeletal: M/S 5/5 throughout.  No visible deformity.  Neurologic: CN 2-12 intact. Pain  and light touch intact in extremities.  Symmetrical.  Speech is fluent. Motor exam as listed above. Psychiatric: Judgment intact, Mood & affect appropriate for pt's clinical situation. Dermatologic: No rashes or ulcers noted.  No changes consistent with cellulitis.   CBC Lab Results  Component Value Date   WBC 6.9 04/09/2023   HGB 9.2 (L) 04/09/2023   HCT 31.5 (L) 04/09/2023   MCV 85.4 04/09/2023   PLT 206 04/09/2023    BMET    Component Value Date/Time   NA 137 04/09/2023 0334   NA 143 12/27/2011 0426   K 3.9 04/09/2023 0334   K 4.5 12/27/2011 0426   CL 106 04/09/2023 0334   CL 113 (H) 12/27/2011 0426   CO2 23 04/09/2023 0334   CO2 22 12/27/2011 0426   GLUCOSE 113 (H) 04/09/2023 0334   GLUCOSE 99 12/27/2011 0426   BUN 33 (H) 04/09/2023 0334   BUN 19 (H) 12/27/2011 0426   CREATININE 1.52 (H) 04/09/2023 0334   CREATININE 1.53 (H) 03/28/2023 0959   CREATININE 1.43 (H) 12/27/2011 0426   CALCIUM 8.7 (L) 04/09/2023 0334   CALCIUM 8.1 (L) 12/27/2011 0426   GFRNONAA 47 (L) 04/09/2023 0334   GFRNONAA 47 (L) 03/28/2023 0959   GFRNONAA 51 (L) 12/27/2011 0426   GFRAA 60 (L) 11/07/2019 1250   GFRAA 60 (L) 12/27/2011 0426   CrCl cannot be calculated (Patient's most recent lab result is older than the maximum 21 days allowed.).  COAG Lab Results  Component Value Date   INR 1.1 12/28/2022    Radiology Ultrasound renal complete  Result Date: 04/20/2023 : PROCEDURE: US RENAL HISTORY: Patient is a 77 y/o M with Retention. TURP. Chronic atrophy of the left kidney. COMPARISON: CT chest/abdomen/pelvis 03/22/2023, U/S renal 11/22/2018. TECHNIQUE: Two-dimensional grayscale and color Doppler ultrasound of the kidneys and bladder was performed. FINDINGS: The bladder demonstrates a normal anechoic echogenicity without wall thickening. Prevoid bladder volume is 624 mL. The post void residual volume is 250 mL. Bilateral ureteral jets are visualized. The right kidney measures 10.3 cm in  length. Renal cortical echotexture is normal. There is no hydronephrosis. There are no stones. There are no cysts. The left kidney is not identified. IMPRESSION: 1. Left kidney not demonstrated on this examination. Right kidney appears within normal limits. 2.   Postvoid volume of 250 mL. Thank you for allowing [place] to assist in the care of this patient. Electronically Signed   By: Lestine Box M.D.   On: 04/20/2023 08:52   CT Angio Chest PE W/Cm &/Or Wo Cm  Result Date: 04/09/2023 CLINICAL DATA:  77 year old male history of adenocarcinoma of the left lung presenting with chest pain and shortness of breath. Evaluate for pulmonary embolism. * Tracking Code: BO * EXAM: CT ANGIOGRAPHY CHEST WITH CONTRAST TECHNIQUE: Multidetector CT imaging of the chest was performed using the standard protocol during bolus administration of intravenous contrast. Multiplanar CT image reconstructions and MIPs were obtained to evaluate the vascular anatomy. RADIATION DOSE REDUCTION: This exam was performed according to the departmental dose-optimization program which includes automated exposure control, adjustment of the mA and/or kV according to patient size and/or use of iterative reconstruction technique. CONTRAST:  75mL OMNIPAQUE IOHEXOL 350 MG/ML SOLN COMPARISON:  None Available. FINDINGS: Cardiovascular: No filling defects within the pulmonary arterial tree to suggest pulmonary embolism. Heart size is normal. There is  no significant pericardial fluid, thickening or pericardial calcification. There is aortic atherosclerosis, as well as atherosclerosis of the great vessels of the mediastinum and the coronary arteries, including calcified atherosclerotic plaque in the left main, left anterior descending, left circumflex and right coronary arteries. Left anterior descending coronary artery stent noted. Mediastinum/Nodes: No pathologically enlarged mediastinal or hilar lymph nodes. Esophagus is unremarkable in appearance. No axillary  lymphadenopathy. Lungs/Pleura: Chronic volume loss and architectural distortion noted in the medial aspect of the left upper lobe and perihilar region, most compatible with areas of chronic postradiation mass-like fibrosis. No definite new suspicious appearing pulmonary nodules or masses are noted. No acute consolidative airspace disease. No pleural effusions. Mild centrilobular emphysema. Upper Abdomen: Aortic atherosclerosis. Musculoskeletal: Post vertebroplasty changes are noted in T5 and T7. There are no aggressive appearing lytic or blastic lesions noted in the visualized portions of the skeleton. Review of the MIP images confirms the above findings. IMPRESSION: 1. No evidence of pulmonary embolism. 2. No acute findings are noted in the thorax to account for the patient's symptoms. 3. 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 No definitive findings to suggest locally recurrent or metastatic disease in the thorax on today's examination. 4. Aortic atherosclerosis, in addition to left main and three-vessel coronary artery disease. Please note that although the presence of coronary artery calcium documents the presence of coronary artery disease, the severity of this disease and any potential stenosis cannot be assessed on this non-gated CT examination. Assessment for potential risk factor modification, dietary therapy or pharmacologic therapy may be warranted, if clinically indicated. 5. Mild centrilobular emphysema. Aortic Atherosclerosis (ICD10-I70.0) and Emphysema (ICD10-J43.9). Electronically Signed   By: Trudie Reed M.D.   On: 04/09/2023 07:52   DG Chest 2 View  Result Date: 04/09/2023 CLINICAL DATA:  77 year old male with history of chest pain. EXAM: CHEST - 2 VIEW COMPARISON:  Chest x-ray 12/29/2022. FINDINGS: Opacity in the anterior aspect of the left lung base presumably involving the lingula, new compared to prior  studies, right lung is clear. No pleural effusions. No pneumothorax. No evidence of pulmonary edema. Heart size is normal. Upper mediastinal contours are within normal limits. Atherosclerotic calcifications in the thoracic aorta. Post vertebroplasty changes are noted in 2 mid to upper thoracic vertebral bodies. IMPRESSION: 1. New area of atelectasis and/or consolidation in the inferior aspect of the lingula. This could represent pneumonia. In the appropriate clinical setting, alveolar hemorrhage from pulmonary infarct should also be considered, particularly in light of the patient's history of chest pain and shortness of breath. Electronically Signed   By: Trudie Reed M.D.   On: 04/09/2023 04:14     Assessment/Plan 1. Aneurysm of ascending aorta without rupture (HCC) Recommend:  No surgery or intervention is indicated at this time.  The patient has an asymptomatic thoracic aortic aneurysm that is less than 6.0 cm in maximal diameter.  I have discussed the natural history of thoracic aortic aneurysm and the small risk of rupture for aneurysm less than 6.5 cm in size.  However, as these small aneurysms tend to enlarge over time, continued surveillance with CT scan is mandatory.   I have also discussed optimizing medical management with hypertension and lipid control and the negative effect that any tobacco products have on aneurysmal disease.  The patient is also encouraged to exercise a minimum of 30 minutes 4 times a week.   Should the patient develop new onset chest or back pain  or signs of peripheral embolization they are instructed to seek medical attention immediately and to alert the physician providing care that they have an aneurysm in the chest.   The patient voices their understanding.  The patient will return as ordered with a CT scan of the chest  2. Aneurysm of descending thoracic aorta without rupture (HCC) Recommend:  No surgery or intervention is indicated at this  time.  The patient has an asymptomatic thoracic aortic aneurysm that is less than 6.0 cm in maximal diameter.  I have discussed the natural history of thoracic aortic aneurysm and the small risk of rupture for aneurysm less than 6.5 cm in size.  However, as these small aneurysms tend to enlarge over time, continued surveillance with CT scan is mandatory.   I have also discussed optimizing medical management with hypertension and lipid control and the negative effect that any tobacco products have on aneurysmal disease.  The patient is also encouraged to exercise a minimum of 30 minutes 4 times a week.   Should the patient develop new onset chest or back pain or signs of peripheral embolization they are instructed to seek medical attention immediately and to alert the physician providing care that they have an aneurysm in the chest.   The patient voices their understanding.  The patient will return as ordered with a CT scan of the chest  3. PAD (peripheral artery disease) (HCC)  Recommend:  The patient has evidence of atherosclerosis of the lower extremities with claudication.  The patient does not voice lifestyle limiting changes at this point in time.  Noninvasive studies do not suggest clinically significant change.  No invasive studies, angiography or surgery at this time The patient should continue walking and begin a more formal exercise program.  The patient should continue antiplatelet therapy and aggressive treatment of the lipid abnormalities  No changes in the patient's medications at this time  Continued surveillance is indicated as atherosclerosis is likely to progress with time.    The patient will continue follow up with noninvasive studies as ordered.  - VAS [place] ABI WITH/WO TBI; Future  4. Nocturnal leg cramps Recommend:  The patient is describing Charley horse type leg cramps. No invasive studies, angiography or surgery at this time.  Magnesium supplementation at  bedtime was recommended.    I have reviewed homeopathic remedies such as Cider vinegar or yellow mustard; placing a bar of soap at the bottom of the bed. Quinine (utilizing tonic water) is also an option.  The patient should continue walking and begin a more formal exercise program.  The patient should continue antiplatelet therapy and aggressive treatment of the lipid abnormalities  The patient should consider wearing graduated compression socks 10-15 mmHg strength to control any mild edema.  The patient will follow up with me on a PRN basis.   5. NSTEMI (non-ST elevated myocardial infarction) (HCC) Continue cardiac and antihypertensive medications as already ordered and reviewed, no changes at this time.  Continue statin as ordered and reviewed, no changes at this time  Nitrates PRN for chest pain  6. Stage 3a chronic kidney disease (HCC) The patient has advanced renal disease.  However, at the present time the patient is not yet on dialysis.  Avoid nephrotoxic medications and dehydration.  Further plans per nephrology    Levora Dredge, MD  05/03/2023 1:44 PM

## 2023-05-06 ENCOUNTER — Encounter (INDEPENDENT_AMBULATORY_CARE_PROVIDER_SITE_OTHER): Payer: Self-pay | Admitting: Vascular Surgery

## 2023-05-06 DIAGNOSIS — I7121 Aneurysm of the ascending aorta, without rupture: Secondary | ICD-10-CM | POA: Insufficient documentation

## 2023-05-06 DIAGNOSIS — I739 Peripheral vascular disease, unspecified: Secondary | ICD-10-CM | POA: Insufficient documentation

## 2023-05-06 DIAGNOSIS — I7123 Aneurysm of the descending thoracic aorta, without rupture: Secondary | ICD-10-CM | POA: Insufficient documentation

## 2023-05-06 DIAGNOSIS — G4762 Sleep related leg cramps: Secondary | ICD-10-CM | POA: Insufficient documentation

## 2023-05-09 DIAGNOSIS — D225 Melanocytic nevi of trunk: Secondary | ICD-10-CM | POA: Diagnosis not present

## 2023-05-09 DIAGNOSIS — R208 Other disturbances of skin sensation: Secondary | ICD-10-CM | POA: Diagnosis not present

## 2023-05-09 DIAGNOSIS — B078 Other viral warts: Secondary | ICD-10-CM | POA: Diagnosis not present

## 2023-05-09 DIAGNOSIS — L538 Other specified erythematous conditions: Secondary | ICD-10-CM | POA: Diagnosis not present

## 2023-05-09 DIAGNOSIS — L570 Actinic keratosis: Secondary | ICD-10-CM | POA: Diagnosis not present

## 2023-05-09 DIAGNOSIS — D2262 Melanocytic nevi of left upper limb, including shoulder: Secondary | ICD-10-CM | POA: Diagnosis not present

## 2023-05-09 DIAGNOSIS — D2272 Melanocytic nevi of left lower limb, including hip: Secondary | ICD-10-CM | POA: Diagnosis not present

## 2023-05-09 DIAGNOSIS — D2261 Melanocytic nevi of right upper limb, including shoulder: Secondary | ICD-10-CM | POA: Diagnosis not present

## 2023-05-09 DIAGNOSIS — J8283 Eosinophilic asthma: Secondary | ICD-10-CM | POA: Diagnosis not present

## 2023-05-09 DIAGNOSIS — L821 Other seborrheic keratosis: Secondary | ICD-10-CM | POA: Diagnosis not present

## 2023-05-09 DIAGNOSIS — D2271 Melanocytic nevi of right lower limb, including hip: Secondary | ICD-10-CM | POA: Diagnosis not present

## 2023-05-09 DIAGNOSIS — J4540 Moderate persistent asthma, uncomplicated: Secondary | ICD-10-CM | POA: Diagnosis not present

## 2023-05-17 ENCOUNTER — Ambulatory Visit: Payer: PPO | Admitting: Urology

## 2023-05-22 DIAGNOSIS — G4733 Obstructive sleep apnea (adult) (pediatric): Secondary | ICD-10-CM | POA: Diagnosis not present

## 2023-05-28 DIAGNOSIS — R040 Epistaxis: Secondary | ICD-10-CM | POA: Diagnosis not present

## 2023-05-28 DIAGNOSIS — J019 Acute sinusitis, unspecified: Secondary | ICD-10-CM | POA: Diagnosis not present

## 2023-06-01 DIAGNOSIS — J4540 Moderate persistent asthma, uncomplicated: Secondary | ICD-10-CM | POA: Diagnosis not present

## 2023-06-01 DIAGNOSIS — J8283 Eosinophilic asthma: Secondary | ICD-10-CM | POA: Diagnosis not present

## 2023-06-07 ENCOUNTER — Encounter: Payer: Self-pay | Admitting: Student in an Organized Health Care Education/Training Program

## 2023-06-07 ENCOUNTER — Telehealth: Payer: Self-pay | Admitting: *Deleted

## 2023-06-07 ENCOUNTER — Ambulatory Visit
Payer: HMO | Attending: Student in an Organized Health Care Education/Training Program | Admitting: Student in an Organized Health Care Education/Training Program

## 2023-06-07 VITALS — BP 124/87 | HR 89 | Temp 98.1°F | Resp 16 | Ht 73.0 in | Wt 245.0 lb

## 2023-06-07 DIAGNOSIS — G8929 Other chronic pain: Secondary | ICD-10-CM | POA: Diagnosis not present

## 2023-06-07 DIAGNOSIS — S22000S Wedge compression fracture of unspecified thoracic vertebra, sequela: Secondary | ICD-10-CM

## 2023-06-07 DIAGNOSIS — M51362 Other intervertebral disc degeneration, lumbar region with discogenic back pain and lower extremity pain: Secondary | ICD-10-CM

## 2023-06-07 DIAGNOSIS — M47816 Spondylosis without myelopathy or radiculopathy, lumbar region: Secondary | ICD-10-CM | POA: Diagnosis not present

## 2023-06-07 DIAGNOSIS — M5442 Lumbago with sciatica, left side: Secondary | ICD-10-CM | POA: Insufficient documentation

## 2023-06-07 DIAGNOSIS — G894 Chronic pain syndrome: Secondary | ICD-10-CM | POA: Diagnosis not present

## 2023-06-07 MED ORDER — GABAPENTIN 100 MG PO CAPS: 100.0000 mg | ORAL_CAPSULE | Freq: Three times a day (TID) | ORAL | 2 refills | Status: AC

## 2023-06-07 NOTE — Progress Notes (Signed)
 Nursing Pain Medication Assessment:  Safety precautions to be maintained throughout the outpatient stay will include: orient to surroundings, keep bed in low position, maintain call bell within reach at all times, provide assistance with transfer out of bed and ambulation.  Medication Inspection Compliance: Pill count conducted under aseptic conditions, in front of the patient. Neither the pills nor the bottle was removed from the patient's sight at any time. Once count was completed pills were immediately returned to the patient in their original bottle.  Medication: Tramadol  (Ultram ) Pill/Patch Count:  5 of 20 pills remain Pill/Patch Appearance: Markings consistent with prescribed medication Bottle Appearance: Standard pharmacy container. Clearly labeled. Filled Date: 22 / 05 / 2024 Last Medication intake:   2 - 3 weeks ago

## 2023-06-07 NOTE — Telephone Encounter (Signed)
 Dr Edward Jolly has seen this patient this morning re; lower back pain on the left.  He has recommended a lumbar facet block which would require stopping Effient x 3 days.  Would that be appropriate for this patient?    Thank You, Tollie Pizza  RNC

## 2023-06-07 NOTE — Progress Notes (Signed)
 Patient: Marc Gray  Service Category: E/M  Provider: Wallie Sherry, MD  DOB: 1945-09-19  DOS: 06/07/2023  Referring Provider: Gregory Edsel Ruth, PA  MRN: [id number]  Setting: Ambulatory outpatient  PCP: Valora Agent, MD  Type: New Patient  Specialty: Interventional Pain Management    Location: Office  Delivery: Face-to-face     Primary Reason(s) for Visit: Encounter for initial evaluation of one or more chronic problems (new to examiner) potentially causing chronic pain, and posing a threat to normal musculoskeletal function. (Level of risk: High) CC: Back Pain (Lumbar left is worse )  HPI  Marc Gray is a 78 y.o. year old, male patient, who comes for the first time to our practice referred by Gregory Edsel Ruth, PA for our initial evaluation of his chronic pain. He has Adenocarcinoma of left lung, stage 3 (HCC); Dehydration; Hypothyroidism due to medication; Chronic bacterial conjunctivitis of both eyes; Constipation, acute; Adrenal insufficiency (HCC); Anxiety associated with cancer diagnosis (HCC); Benign neoplasm of colon, unspecified; Chest pain, unspecified; SOB (shortness of breath); Aortic atherosclerosis (HCC); MGUS (monoclonal gammopathy of unknown significance); History of prostate cancer; Stage 3a chronic kidney disease (HCC); NSTEMI (non-ST elevated myocardial infarction) (HCC); IDA (iron  deficiency anemia); Ascending aortic aneurysm (HCC); Descending thoracic aortic aneurysm (HCC); PAD (peripheral artery disease) (HCC); Nocturnal leg cramps; Lumbar spondylosis; Degeneration of intervertebral disc of lumbar region with discogenic back pain and lower extremity pain; Thoracic compression fracture, sequela; Chronic left-sided low back pain with left-sided sciatica; and Chronic pain syndrome on their problem list. Today he comes in for evaluation of his Back Pain (Lumbar left is worse )  Pain Assessment: Location: Lower, Left Back Radiating: some pain in both legs,   ?arthitis pain Onset: More than a month ago Duration: Chronic pain Quality: Constant, Discomfort, Sharp, Stabbing, Numbness Severity: 3 /10 (subjective, self-reported pain score)  Effect on ADL: can be limiting Timing: Constant Modifying factors: tramadol  and OTC medicines, rest BP: 124/87  HR: 89  Onset and Duration: Present longer than 3 months Cause of pain: Work related accident or event Severity: Getting worse, NAS-11 at its worse: 9/10, NAS-11 at its best: 2/10, NAS-11 now: 5/10, and NAS-11 on the average: 6/10 Timing: During activity or exercise Aggravating Factors: Climbing, Kneeling, Lifiting, Prolonged standing, Walking, and Walking uphill Alleviating Factors: Lying down, Medications, Resting, Sitting, and Warm showers or baths Associated Problems: Day-time cramps, Night-time cramps, Depression, Fatigue, Numbness, Personality changes, Sadness, Spasms, Tingling, and Weakness Quality of Pain: Aching, Agonizing, Cruel, Disabling, Horrible, Nagging, Sharp, Shooting, and Uncomfortable Previous Examinations or Tests: CT scan, MRI scan, and X-rays Previous Treatments: Epidural steroid injections and Physical Therapy  Marc Gray is being evaluated for possible interventional pain management therapies for the treatment of his chronic pain.  Discussed the use of AI scribe software for clinical note transcription with the patient, who gave verbal consent to proceed.  History of Present Illness   The patient, with a history of recent myocardial infarction in August, presents with chronic lower back pain, primarily on the left side. The pain has been ongoing for several years due to lumbar facet arthritis and lumbar disc herniations. The patient also reports occasional radiating pain into the left leg, but the back pain is more bothersome.  In the past, the patient underwent epidural injections for the back pain, but reported no significant  improvement. The patient also has a history of  iron  deficiency and is currently on blood thinners following a heart attack last summer.  The patient  describes the back pain as intermittent, not present every morning, but can suddenly start while standing.   The patient denies any pain on the right side of the back. The patient is not currently on any medication for neuropathy or nerve pain.      He has done PT in the past with limited response. Meds   Current Outpatient Medications:    acetaminophen  (TYLENOL ) 650 MG CR tablet, Take 1,300 mg by mouth every 8 (eight) hours as needed for pain., Disp: , Rfl:    amoxicillin-clavulanate (AUGMENTIN) 875-125 MG tablet, Take 1 tablet by mouth 2 (two) times daily., Disp: , Rfl:    amphetamine -dextroamphetamine  (ADDERALL) 20 MG tablet, Take by mouth., Disp: , Rfl:    aspirin  EC 81 MG tablet, Take 1 tablet (81 mg total) by mouth daily. Swallow whole., Disp: 90 tablet, Rfl: 0   Azelastine  HCl 137 MCG/SPRAY SOLN, SMARTSIG:1-2 Spray(s) Both Nares Twice Daily, Disp: , Rfl:    BREZTRI  AEROSPHERE 160-9-4.8 MCG/ACT AERO, Inhale 2 puffs into the lungs 2 (two) times daily., Disp: , Rfl:    calcium -vitamin D  (OSCAL WITH D) 500-200 MG-UNIT tablet, Take 1 tablet by mouth daily., Disp: 90 tablet, Rfl: 1   DUPIXENT 300 MG/2ML SOPN, Inject 300 mg into the skin every 14 (fourteen) days., Disp: , Rfl:    EPINEPHrine 0.3 mg/0.3 mL IJ SOAJ injection, Inject 0.3 mg into the muscle as needed., Disp: , Rfl:    fluticasone  (FLONASE ) 50 MCG/ACT nasal spray, Place 1 spray into both nostrils daily., Disp: 16 g, Rfl: 2   furosemide  (LASIX ) 20 MG tablet, Take by mouth., Disp: , Rfl:    gabapentin  (NEURONTIN ) 100 MG capsule, Take 1 capsule (100 mg total) by mouth 3 (three) times daily., Disp: 90 capsule, Rfl: 2   Ipratropium-Albuterol  (COMBIVENT) 20-100 MCG/ACT AERS respimat, Inhale into the lungs., Disp: , Rfl:    levothyroxine  (SYNTHROID ) 137 MCG tablet, Take by mouth., Disp: , Rfl:    Melatonin 10 MG TABS, Take 10 mg by  mouth at bedtime. , Disp: , Rfl:    metoprolol  tartrate (LOPRESSOR ) 25 MG tablet, Take 0.5 tablets (12.5 mg total) by mouth 2 (two) times daily., Disp: 30 tablet, Rfl: 2   montelukast  (SINGULAIR ) 10 MG tablet, Take 10 mg by mouth daily., Disp: , Rfl:    Multiple Vitamin (MULTI-VITAMINS) TABS, Take 1 tablet by mouth daily. , Disp: , Rfl:    mupirocin ointment (BACTROBAN) 2 %, Apply 1 Application topically daily., Disp: , Rfl:    pantoprazole  (PROTONIX ) 40 MG tablet, Take 1 tablet (40 mg total) by mouth daily., Disp: 90 tablet, Rfl: 0   prasugrel  (EFFIENT ) 10 MG TABS tablet, Take 1 tablet (10 mg total) by mouth daily., Disp: 30 tablet, Rfl: 11   predniSONE  (DELTASONE ) 5 MG tablet, Take 5 mg by mouth daily., Disp: , Rfl:    prochlorperazine  (COMPAZINE ) 10 MG tablet, Take 1 tablet (10 mg total) by mouth every 6 (six) hours as needed for nausea or vomiting., Disp: 60 tablet, Rfl: 2   rosuvastatin  (CRESTOR ) 10 MG tablet, , Disp: , Rfl:    SHINGRIX injection, , Disp: , Rfl:    SPIKEVAX syringe, , Disp: , Rfl:    tadalafil  (CIALIS ) 5 MG tablet, Take 5-10mg  by mouth as needed prior to intercourse, Disp: 30 tablet, Rfl: 11   tamsulosin  (FLOMAX ) 0.4 MG CAPS capsule, Take 1 capsule (0.4 mg total) by mouth daily., Disp: 90 capsule, Rfl: 3   traMADol  (ULTRAM ) 50 MG tablet, Take  50 mg by mouth every 6 (six) hours as needed for moderate pain (pain score 4-6). If he knows that he is going to be on his feet he will take a tablet to combat the pain, Disp: , Rfl:    traZODone  (DESYREL ) 100 MG tablet, Take 100 mg by mouth at bedtime., Disp: , Rfl:    acetaminophen  (TYLENOL ) 500 MG tablet, Take 1,000 mg by mouth every 6 (six) hours as needed for moderate pain. (Patient not taking: Reported on 06/07/2023), Disp: , Rfl:    triamcinolone  ointment (KENALOG ) 0.5 %, Apply 1 application topically 3 (three) times daily as needed. (Patient not taking: Reported on 06/07/2023), Disp: 30 g, Rfl: 1 No current facility-administered  medications for this visit.  Facility-Administered Medications Ordered in Other Visits:    sodium chloride  flush (NS) 0.9 % injection 10 mL, 10 mL, Intravenous, PRN, Babara Call, MD, 10 mL at 04/03/17 9166  Imaging Review  Cervical Imaging: Cervical MR wo contrast: Results for orders placed during the hospital encounter of 02/27/23  MR CERVICAL SPINE WO CONTRAST  Narrative CLINICAL DATA:  Myelopathy, acute, cervical spine. Stiff neck over the last 2 years.  EXAM: MRI CERVICAL SPINE WITHOUT CONTRAST  TECHNIQUE: Multiplanar, multisequence MR imaging of the cervical spine was performed. No intravenous contrast was administered.  COMPARISON:  None Available.  FINDINGS: Alignment: No malalignment.  Vertebrae: Old healed superior endplate fracture at T2 with loss of height of 25% anteriorly.  Cord: No cord compression or focal cord lesion.  Posterior Fossa, vertebral arteries, paraspinal tissues: Negative  Disc levels:  The foramen magnum is widely patent. There is ordinary mild osteoarthritis of the C1-2 articulation but no encroachment upon the neural structures.  C2-3: Normal interspace.  C3-4: Minimal disc bulge.  Mild facet osteoarthritis.  No stenosis.  C4-5: Endplate osteophytes and mild bulging of the disc. Mild left foraminal narrowing, probably not compressive.  C5-6: Endplate osteophytes and bulging of the disc. Uncovertebral osteophytes cause foraminal encroachment right more than left. Some potential the right C6 nerve could be affected.  C6-7: Endplate osteophytes and bulging of the disc. Narrowing of the ventral subarachnoid space but no compressive effect upon the cord. Mild foraminal narrowing on the left.  C7-T1: Normal  IMPRESSION: 1. No cord compression or focal cord lesion. 2. C5-6: Endplate osteophytes and bulging of the disc. Uncovertebral osteophytes cause foraminal encroachment right more than left. Some potential the right C6 nerve could  be affected. 3. C6-7: Endplate osteophytes and bulging of the disc. Narrowing of the ventral subarachnoid space but no compressive effect upon the cord. Mild foraminal narrowing on the left. 4. C4-5: Endplate osteophytes and bulging of the disc. Mild left foraminal narrowing, probably not compressive. 5. Old healed superior endplate fracture at T2 with loss of height of 25% anteriorly. 6. Findings in general could be associated with neck stiffness, as described clinically.   Electronically Signed By: Oneil Officer M.D. On: 03/21/2023 10:08  MR Thoracic Spine W Wo Contrast  Narrative CLINICAL DATA:  Back pain. History of lung cancer status post radiation.  EXAM: MRI THORACIC WITHOUT AND WITH CONTRAST  TECHNIQUE: Multiplanar and multiecho pulse sequences of the thoracic spine were obtained without and with intravenous contrast.  CONTRAST:  10 mL Gadavist  intravenous contrast.  COMPARISON:  PET-CT dated July 29, 2018. CT chest dated July 17, 2018.  FINDINGS: Alignment:  Physiologic.  Vertebrae: New acute mild inferior endplate compression fractures at T5 and T7 with prominent marrow edema and enhancement throughout both  vertebral bodies. No focal bone lesion. No abnormal signal extension into the posterior elements. Unchanged chronic mild T2 and T4 superior endplate compression fractures with minimal residual edema and enhancement.  No evidence of discitis.  Cord:  Normal signal and morphology.  No intradural enhancement.  Paraspinal and other soft tissues: Prominent posterior epidural fat. Unchanged left apical pleuroparenchymal opacity, better evaluated on recent PET-CT. Atrophic left kidney.  Disc levels:  Tiny central disc protrusions at T4-T5 and T5-T6. No spinal canal or neuroforaminal stenosis at any level.  IMPRESSION: 1. Acute, benign-appearing mild T5 and T7 inferior endplate compression fractures. 2. Unchanged chronic mild T2 and T4 superior  endplate compression fractures.   Electronically Signed By: Elsie ONEIDA Shoulder M.D. On: 08/02/2018 16:00   Lumbosacral Imaging: Lumbar MR wo contrast: Results for orders placed during the hospital encounter of 02/27/23  MR LUMBAR SPINE WO CONTRAST  Narrative CLINICAL DATA:  Lumbar radiculopathy, symptoms persist with greater than 6 weeks of treatment. Low back pain.  EXAM: MRI LUMBAR SPINE WITHOUT CONTRAST  TECHNIQUE: Multiplanar, multisequence MR imaging of the lumbar spine was performed. No intravenous contrast was administered.  COMPARISON:  The agger fee 02/20/2023.  MRI 04/10/2021.  FINDINGS: Segmentation: 5 lumbar type vertebral bodies as numbered previously.  Alignment: Straightening of the normal lumbar lordosis. 2 mm degenerative anterolisthesis at L3-4. 3 mm degenerative retrolisthesis L5-S1. Unchanged since 2022.  Vertebrae: No fracture or focal bone lesion. Degenerative endplate changes at L4-5 and L5-S1 without significant active edema.  Conus medullaris and cauda equina: Conus extends to the L1 level. Conus and cauda equina appear normal.  Paraspinal and other soft tissues: Bladder appears quite full.  Disc levels:  T12-L1: Normal  L1-2: Mild bulging of the disc.  No stenosis or neural compression.  L2-3: Mild bulging of the disc. Bilateral facet osteoarthritis. No compressive stenosis.  L3-4: Bilateral facet osteoarthritis with 2 mm of anterolisthesis. Mild bulging of the disc. No compressive stenosis.  L4-5: Endplate osteophytes and broad-based disc protrusion more prominent in the left posterolateral direction, with caudal down turning in the left lateral recess. This has desiccated slightly since the prior study. There is stenosis of the left lateral recess that could compress the left L5 nerve. Mild encroachment upon the right lateral foramen by osteophyte in disc material is similar to the prior study, without definite compression of the  exiting right L4 nerve.  L5-S1: Retrolisthesis of 3 mm. Chronic disc degeneration with loss of disc height. Endplate osteophytes and bulging of the disc more prominent in the left posterolateral direction. Mild left foraminal narrowing but no definite compression of the exiting L5 nerve.  IMPRESSION: 1. L4-5: Endplate osteophytes and broad-based disc protrusion more prominent in the left posterolateral direction, with caudal down turning in the left lateral recess. This has desiccated slightly since the prior study. There is stenosis of the left lateral recess that could compress the left L5 nerve. Mild encroachment upon the right lateral foramen by osteophyte and disc material is similar to the prior study, without definite compression of the exiting right L4 nerve. 2. L5-S1: Chronic disc degeneration with loss of disc height. Endplate osteophytes and bulging of the disc more prominent in the left posterolateral direction. Mild left foraminal narrowing but no definite compression of the exiting L5 nerve in this location. 3. L3-4: Bilateral facet osteoarthritis with 2 mm of anterolisthesis. Mild bulging of the disc. No compressive stenosis. 4. L2-3: Mild bulging of the disc. Bilateral facet osteoarthritis. No compressive stenosis.   Electronically  Signed By: Oneil Officer M.D. On: 03/21/2023 10:16  DG Lumbar Spine Complete  Narrative CLINICAL DATA:  Pain.  EXAM: LUMBAR SPINE - COMPLETE 5 VIEW  COMPARISON:  None Available.  FINDINGS: No compression deformities. Osseous structures are osteopenic. There is grade 1 L4 retrolisthesis. L4-5 and L5-S1 disc space narrowing and marginal osteophytes consistent with degenerative disc disease. No motion with flexion and extension.  There is dense calcification of the aorta and iliacs. There is suggestion of distal abdominal aortic aneurysm which can be assessed further with CT or ultrasound.  IMPRESSION: Osteopenia.  Degenerative changes. Distal abdominal aortic aneurysm that can be evaluated more definitively with ultrasound or CT.   Electronically Signed By: Fonda Field M.D. On: 03/11/2023 19:11    Complexity Note: Imaging results reviewed.                         ROS  Cardiovascular: hear trouble, daily aspirin , Effient  blood thinner, hx of MI  Pulmonary or Respiratory: Lung problems, Wheezing and difficulty taking a deep full breath (Asthma), Difficulty blowing air out (Emphysema), Shortness of breath, and Snoring  Neurological: No reported neurological signs or symptoms such as seizures, abnormal skin sensations, urinary and/or fecal incontinence, being born with an abnormal open spine and/or a tethered spinal cord Psychological-Psychiatric: Anxiousness and Depressed Gastrointestinal: No reported gastrointestinal signs or symptoms such as vomiting or evacuating blood, reflux, heartburn, alternating episodes of diarrhea and constipation, inflamed or scarred liver, or pancreas or irrregular and/or infrequent bowel movements Genitourinary: Kidney disease Hematological: Weakness due to low blood hemoglobin or red blood cell count (Anemia) and Bleeding easily Endocrine: Slow thyroid  Rheumatologic: No reported rheumatological signs and symptoms such as fatigue, joint pain, tenderness, swelling, redness, heat, stiffness, decreased range of motion, with or without associated rash Musculoskeletal: Negative for myasthenia gravis, muscular dystrophy, multiple sclerosis or malignant hyperthermia Work History: Retired  Allergies  Mr. Blomquist is allergic to morphine  and codeine.  Laboratory Chemistry Profile   Renal Lab Results  Component Value Date   BUN 33 (H) 04/09/2023   CREATININE 1.52 (H) 04/09/2023   GFRAA 60 (L) 11/07/2019   GFRNONAA 47 (L) 04/09/2023   SPECGRAV 1.020 02/05/2019   PHUR 5.5 02/05/2019   PROTEINUR NEGATIVE 02/07/2019     Electrolytes Lab Results  Component Value  Date   NA 137 04/09/2023   K 3.9 04/09/2023   CL 106 04/09/2023   CALCIUM  8.7 (L) 04/09/2023   MG 2.3 12/30/2022     Hepatic Lab Results  Component Value Date   AST 20 03/28/2023   ALT 22 03/28/2023   ALBUMIN 3.5 03/28/2023   ALKPHOS 34 (L) 03/28/2023   LIPASE 31 11/08/2020     ID Lab Results  Component Value Date   SARSCOV2NAA NEGATIVE 12/28/2022     Bone No results found for: VD25OH, CI874NY7UNU, CI6874NY7, CI7874NY7, 25OHVITD1, 25OHVITD2, 25OHVITD3, TESTOFREE, TESTOSTERONE   Endocrine Lab Results  Component Value Date   GLUCOSE 113 (H) 04/09/2023   GLUCOSEU NEGATIVE 02/07/2019   HGBA1C 5.8 (H) 12/28/2022   TSH 0.384 05/09/2019   FREET4 0.37 (L) 07/27/2017   CRTSLPL 0.7 11/27/2017     Neuropathy Lab Results  Component Value Date   HGBA1C 5.8 (H) 12/28/2022     CNS No results found for: COLORCSF, APPEARCSF, RBCCOUNTCSF, WBCCSF, POLYSCSF, LYMPHSCSF, EOSCSF, PROTEINCSF, GLUCCSF, JCVIRUS, CSFOLI, IGGCSF, LABACHR, ACETBL   Inflammation (CRP: Acute  ESR: Chronic) No results found for: CRP, ESRSEDRATE, LATICACIDVEN   Rheumatology No  results found for: RF, ANA, LABURIC, URICUR, LYMEIGGIGMAB, LYMEABIGMQN, HLAB27   Coagulation Lab Results  Component Value Date   INR 1.1 12/28/2022   LABPROT 14.1 12/28/2022   APTT 30 12/28/2022   PLT 206 04/09/2023   DDIMER 0.95 (H) 12/28/2022     Cardiovascular Lab Results  Component Value Date   BNP 152.9 (H) 12/28/2022   TROPONINI <0.03 05/15/2017   HGB 9.2 (L) 04/09/2023   HCT 31.5 (L) 04/09/2023     Screening Lab Results  Component Value Date   SARSCOV2NAA NEGATIVE 12/28/2022     Cancer No results found for: CEA, CA125, LABCA2   Allergens No results found for: ALMOND, APPLE, ASPARAGUS, AVOCADO, BANANA, BARLEY, BASIL, BAYLEAF, GREENBEAN, LIMABEAN, WHITEBEAN, BEEFIGE, REDBEET, BLUEBERRY, BROCCOLI, CABBAGE,  MELON, CARROT, CASEIN, CASHEWNUT, CAULIFLOWER, CELERY     Note: Lab results reviewed.  PFSH  Drug: Mr. Paige  reports no history of drug use. Alcohol :  reports that he does not currently use alcohol . Tobacco:  reports that he quit smoking about 6 years ago. His smoking use included cigarettes. He started smoking about 59 years ago. He has a 53 pack-year smoking history. He has been exposed to tobacco smoke. He has never used smokeless tobacco. Medical:  has a past medical history of Anxiety, Aortic atherosclerosis (HCC), Arthritis, BPH (benign prostatic hyperplasia), Cataract, Chronic kidney disease, Colon adenomas, Colon cancer (HCC) (2015), COPD (chronic obstructive pulmonary disease) (HCC), Depression, Dyspnea, Enlarged prostate, GERD (gastroesophageal reflux disease), Hypertension, Hypothyroidism, Lung cancer (HCC), and Pain. Family: family history includes Breast cancer in his maternal grandmother; Dementia in his mother; Diabetes in his father; Stroke in his father.  Past Surgical History:  Procedure Laterality Date   BACK SURGERY     kyphoplasty  T5-6   CATARACT EXTRACTION W/ INTRAOCULAR LENS  IMPLANT, BILATERAL     COLONOSCOPY     COLONOSCOPY WITH PROPOFOL  N/A 12/17/2019   Procedure: COLONOSCOPY WITH PROPOFOL ;  Surgeon: Dessa Reyes ORN, MD;  Location: [facility];  Service: Endoscopy;  Laterality: N/A;   CORONARY STENT INTERVENTION N/A 12/29/2022   Procedure: CORONARY STENT INTERVENTION;  Surgeon: Ammon Blunt, MD;  Location: [facility];  Service: Cardiovascular;  Laterality: N/A;   EYE SURGERY Bilateral    cataract   LEFT HEART CATH AND CORONARY ANGIOGRAPHY N/A 12/29/2022   Procedure: LEFT HEART CATH AND CORONARY ANGIOGRAPHY;  Surgeon: Ammon Blunt, MD;  Location: [facility];  Service: Cardiovascular;  Laterality: N/A;   LYMPH GLAND EXCISION N/A 02/09/2017   Procedure: CERVICAL LYMPH NODE BIOPSY;  Surgeon: Dessa Reyes ORN, MD;   Location: [facility];  Service: General;  Laterality: N/A;   PORTACATH PLACEMENT Right 02/23/2017   Procedure: INSERTION PORT-A-CATH;  Surgeon: Dessa Reyes ORN, MD;  Location: [facility];  Service: General;  Laterality: Right;   PROSTATE SURGERY     TONSILLECTOMY     TRANSURETHRAL RESECTION OF PROSTATE N/A 02/14/2019   Procedure: TRANSURETHRAL RESECTION OF THE PROSTATE (TURP);  Surgeon: Francisca Redell BROCKS, MD;  Location: [facility];  Service: Urology;  Laterality: N/A;   Active Ambulatory Problems    Diagnosis Date Noted   Adenocarcinoma of left lung, stage 3 (HCC) 02/14/2017   Dehydration 05/18/2017   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   Chronic bacterial conjunctivitis of both eyes 09/14/2017   Constipation, acute 11/19/2017   Adrenal insufficiency (HCC) 11/28/2017   Anxiety associated with cancer diagnosis (HCC) 02/06/2019   Benign neoplasm of colon, unspecified 02/24/2014   Chest pain, unspecified 07/03/2017  SOB (shortness of breath) 07/03/2017   Aortic atherosclerosis (HCC) 08/05/2019   MGUS (monoclonal gammopathy of unknown significance) 03/28/2022   History of prostate cancer 03/28/2022   Stage 3a chronic kidney disease (HCC) 12/28/2022   NSTEMI (non-ST elevated myocardial infarction) (HCC) 12/28/2022   IDA (iron  deficiency anemia) 03/28/2023   Ascending aortic aneurysm (HCC) 05/06/2023   Descending thoracic aortic aneurysm (HCC) 05/06/2023   PAD (peripheral artery disease) (HCC) 05/06/2023   Nocturnal leg cramps 05/06/2023   Lumbar spondylosis 06/07/2023   Degeneration of intervertebral disc of lumbar region with discogenic back pain and lower extremity pain 06/07/2023   Thoracic compression fracture, sequela 06/07/2023   Chronic left-sided low back pain with left-sided sciatica 06/07/2023   Chronic pain syndrome 06/07/2023   Resolved Ambulatory Problems    Diagnosis Date Noted   Neck mass 02/09/2017   Malignant neoplasm of overlapping sites of lung (HCC)  02/16/2017   Port-A-Cath in place 03/28/2022   Past Medical History:  Diagnosis Date   Anxiety    Arthritis    BPH (benign prostatic hyperplasia)    Cataract    Chronic kidney disease    Colon adenomas    Colon cancer (HCC) 2015   COPD (chronic obstructive pulmonary disease) (HCC)    Depression    Dyspnea    Enlarged prostate    GERD (gastroesophageal reflux disease)    Hypertension    Hypothyroidism    Lung cancer (HCC)    Pain    Constitutional Exam  General appearance: Well nourished, well developed, and well hydrated. In no apparent acute distress Vitals:   06/07/23 0907  BP: 124/87  Pulse: 89  Resp: 16  Temp: 98.1 F (36.7 C)  TempSrc: Temporal  SpO2: 93%  Weight: 245 lb (111.1 kg)  Height: 6' 1 (1.854 m)   BMI Assessment: Estimated body mass index is 32.32 kg/m as calculated from the following:   Height as of this encounter: 6' 1 (1.854 m).   Weight as of this encounter: 245 lb (111.1 kg).  BMI interpretation table: BMI level Category Range association with higher incidence of chronic pain  <18 kg/m2 Underweight   18.5-24.9 kg/m2 Ideal body weight   25-29.9 kg/m2 Overweight Increased incidence by 20%  30-34.9 kg/m2 Obese (Class I) Increased incidence by 68%  35-39.9 kg/m2 Severe obesity (Class II) Increased incidence by 136%  >40 kg/m2 Extreme obesity (Class Gray) Increased incidence by 254%   Patient's current BMI Ideal Body weight  Body mass index is 32.32 kg/m. Ideal body weight: 79.9 kg (176 lb 2.4 oz) Adjusted ideal body weight: 92.4 kg (203 lb 11 oz)   BMI Readings from Last 4 Encounters:  06/07/23 32.32 kg/m  05/03/23 33.25 kg/m  04/16/23 33.51 kg/m  04/09/23 34.06 kg/m   Wt Readings from Last 4 Encounters:  06/07/23 245 lb (111.1 kg)  05/03/23 252 lb (114.3 kg)  04/16/23 247 lb 1.6 oz (112.1 kg)  04/09/23 258 lb 2.5 oz (117.1 kg)    Psych/Mental status: Alert, oriented x 3 (person, place, & time)       Eyes: PERLA Respiratory:  No evidence of acute respiratory distress  Lumbar Spine Area Exam  Skin & Axial Inspection: No masses, redness, or swelling Alignment: Symmetrical Functional ROM: Pain restricted ROM affecting primarily the left Stability: No instability detected Muscle Tone/Strength: Functionally intact. No obvious neuro-muscular anomalies detected. Sensory (Neurological): Musculoskeletal pain pattern Palpation: No palpable anomalies       Provocative Tests: Hyperextension/rotation test: (+) on the left  for facet joint pain. Lumbar quadrant test (Kemp's test): (+) on the left for facet joint pain. Lateral bending test: (+) due to pain.  Gait & Posture Assessment  Ambulation: Unassisted Gait: Relatively normal for age and body habitus Posture: WNL  Lower Extremity Exam    Side: Right lower extremity  Side: Left lower extremity  Stability: No instability observed          Stability: No instability observed          Skin & Extremity Inspection: Skin color, temperature, and hair growth are WNL. No peripheral edema or cyanosis. No masses, redness, swelling, asymmetry, or associated skin lesions. No contractures.  Skin & Extremity Inspection: Skin color, temperature, and hair growth are WNL. No peripheral edema or cyanosis. No masses, redness, swelling, asymmetry, or associated skin lesions. No contractures.  Functional ROM: Unrestricted ROM                  Functional ROM: Unrestricted ROM                  Muscle Tone/Strength: Functionally intact. No obvious neuro-muscular anomalies detected.  Muscle Tone/Strength: Functionally intact. No obvious neuro-muscular anomalies detected.  Sensory (Neurological): Unimpaired        Sensory (Neurological): Unimpaired        DTR: Patellar: deferred today Achilles: deferred today Plantar: deferred today  DTR: Patellar: deferred today Achilles: deferred today Plantar: deferred today  Palpation: No palpable anomalies  Palpation: No palpable anomalies   5 out of 5  strength bilateral lower extremity: Plantar flexion, dorsiflexion, knee flexion, knee extension.   Assessment  Primary Diagnosis & Pertinent Problem List: The primary encounter diagnosis was Lumbar facet arthropathy. Diagnoses of Chronic left-sided low back pain with left-sided sciatica, Lumbar spondylosis, Degeneration of intervertebral disc of lumbar region with discogenic back pain and lower extremity pain, Thoracic compression fracture, sequela, and Chronic pain syndrome were also pertinent to this visit.  Visit Diagnosis (New problems to examiner): 1. Lumbar facet arthropathy   2. Chronic left-sided low back pain with left-sided sciatica   3. Lumbar spondylosis   4. Degeneration of intervertebral disc of lumbar region with discogenic back pain and lower extremity pain   5. Thoracic compression fracture, sequela   6. Chronic pain syndrome    Plan of Care (Initial workup plan)  Note: Mr. Silverman was reminded that as per protocol, today's visit has been an evaluation only. We have not taken over the patient's controlled substance management.  Problem-specific plan: Assessment and Plan    Chronic Lower Back Pain with Lumbar Facet Arthritis   He has experienced chronic lower back pain, primarily on the left side for several years, likely due tofacet  arthritis. Previous treatments, including epidural steroid injections, have not provided significant relief. The current pain, localized to the back, suggests arthritis as the primary cause. He is on Effient  due to a recent myocardial infarction, complicating interventional procedures. We discussed stopping Effient  for three days prior to the procedure to minimize bleeding risk. If Dr. Carnella does not agree, we will postpone the procedure until summer. We considered gabapentin  for neuropathic pain, which is non-narcotic and non-opioid. We will contact Dr. Carnella to discuss stopping Effient  for three days prior to the procedure. We will  prescribe gabapentin  100 mg TID, with the potential to increase to 200 mg and then 300 mg TID if well tolerated. We will follow up in two weeks to assess the effectiveness of gabapentin  and plan for nerve blocks.  Marc Gray has a history of greater than 3 months of moderate to severe pain which is resulted in functional impairment.  The patient has tried various conservative therapeutic options such as NSAIDs, Tylenol , muscle relaxants, physical therapy which was inadequately effective.  Patient's pain is predominantly axial with physical exam and L-MRI findings suggestive of facet arthropathy. Lumbar facet medial branch nerve blocks were discussed with the patient.  Risks and benefits were reviewed.  Patient would like to proceed with LEFT L3, L4, L5 medial branch nerve block if he gets cardiac clearance to stop his Effient .  If he does not we may need to delay his procedure to after the summer once he has been on dual antiplatelet therapy post PCI for 12 months.   Procedure Orders         LUMBAR FACET(MEDIAL BRANCH NERVE BLOCK) MBNB     Pharmacotherapy (current): Medications ordered:  Meds ordered this encounter  Medications   gabapentin  (NEURONTIN ) 100 MG capsule    Sig: Take 1 capsule (100 mg total) by mouth 3 (three) times daily.    Dispense:  90 capsule    Refill:  2   Medications administered during this visit: Marc HILARIO Carrow DOUGLAS Dick had no medications administered during this visit.   Analgesic Pharmacotherapy:  Opioid Analgesics: For patients currently taking or requesting to take opioid analgesics, in accordance with [place]  Medical Board Guidelines, we will assess their risks and indications for the use of these substances. After completing our evaluation, we may offer recommendations, but we no longer take patients for medication management. The prescribing physician will ultimately decide, based on his/her training and level of comfort  whether to adopt any of the recommendations, including whether or not to prescribe such medicines.  Membrane stabilizer: To be determined at a later time  Muscle relaxant: To be determined at a later time  NSAID: To be determined at a later time  Other analgesic(s): To be determined at a later time   Interventional management options: Mr. Shiner was informed that there is no guarantee that he would be a candidate for interventional therapies. The decision will be based on the results of diagnostic studies, as well as Mr. Sanfilippo risk profile.  Procedure(s) under consideration:  Left L3, L4, L5 medial branch nerve block Repeat lumbar epidural steroid injection Sprint peripheral nerve stimulation    Provider-requested follow-up: Return in about 4 weeks (around 07/05/2023) for Left L3, 4, 5, MBNB #1 , in clinic NS (need cardiac clearance to stop Effient  for 3 days).  Future Appointments  Date Time Provider Department Center  06/28/2023 11:15 AM Francisca Redell BROCKS, MD [facility] None  07/02/2023  9:30 AM [facility] VASC 3 [facility] None  07/02/2023 10:30 AM Schnier, Cordella MATSU, MD [facility] None  07/24/2023  9:30 AM [facility] None  07/31/2023 10:45 AM Babara Call, MD [facility] None  07/31/2023 11:00 AM [facility] INFUSION CHAIR 1 [facility] None    Duration of encounter: .  Total time on encounter, as per AMA guidelines included both the face-to-face and non-face-to-face time personally spent by the physician and/or other qualified health care professional(s) on the day of the encounter (includes time in activities that require the physician or other qualified health care professional and does not include time in activities normally performed by clinical staff). Physician's time may include the following activities when performed: Preparing to see the patient (e.g., pre-charting review of records, searching for previously ordered imaging, lab work, and nerve conduction  tests) Review of prior  analgesic pharmacotherapies. Reviewing PMP Interpreting ordered tests (e.g., lab work, imaging, nerve conduction tests) Performing post-procedure evaluations, including interpretation of diagnostic procedures Obtaining and/or reviewing separately obtained history Performing a medically appropriate examination and/or evaluation Counseling and educating the patient/family/caregiver Ordering medications, tests, or procedures Referring and communicating with other health care professionals (when not separately reported) Documenting clinical information in the electronic or other health record Independently interpreting results (not separately reported) and communicating results to the patient/ family/caregiver Care coordination (not separately reported)  Note by: Wallie Sherry, MD (AI and TTS technology used. I apologize for any typographical errors that were not detected and corrected.) Date: 06/07/2023; Time: 10:30 AM

## 2023-06-07 NOTE — Progress Notes (Deleted)
 Safety precautions to be maintained throughout the outpatient stay will include: orient to surroundings, keep bed in low position, maintain call bell within reach at all times, provide assistance with transfer out of bed and ambulation.

## 2023-06-07 NOTE — Telephone Encounter (Signed)
 If not prudent to stop Effient x 3 days at this time, when would it be safe to do so.  Please see prior message.

## 2023-06-19 DIAGNOSIS — J8283 Eosinophilic asthma: Secondary | ICD-10-CM | POA: Diagnosis not present

## 2023-06-19 DIAGNOSIS — J4540 Moderate persistent asthma, uncomplicated: Secondary | ICD-10-CM | POA: Diagnosis not present

## 2023-06-21 ENCOUNTER — Ambulatory Visit: Payer: PPO | Admitting: Urology

## 2023-06-22 DIAGNOSIS — G4733 Obstructive sleep apnea (adult) (pediatric): Secondary | ICD-10-CM | POA: Diagnosis not present

## 2023-06-28 ENCOUNTER — Ambulatory Visit (INDEPENDENT_AMBULATORY_CARE_PROVIDER_SITE_OTHER): Payer: HMO | Admitting: Urology

## 2023-06-28 VITALS — BP 126/68 | HR 90 | Ht 73.0 in | Wt 244.6 lb

## 2023-06-28 DIAGNOSIS — N189 Chronic kidney disease, unspecified: Secondary | ICD-10-CM | POA: Diagnosis not present

## 2023-06-28 DIAGNOSIS — N529 Male erectile dysfunction, unspecified: Secondary | ICD-10-CM

## 2023-06-28 DIAGNOSIS — C61 Malignant neoplasm of prostate: Secondary | ICD-10-CM

## 2023-06-28 DIAGNOSIS — N401 Enlarged prostate with lower urinary tract symptoms: Secondary | ICD-10-CM

## 2023-06-28 DIAGNOSIS — N528 Other male erectile dysfunction: Secondary | ICD-10-CM

## 2023-06-28 LAB — BLADDER SCAN AMB NON-IMAGING

## 2023-06-28 MED ORDER — TAMSULOSIN HCL 0.4 MG PO CAPS: 0.4000 mg | ORAL_CAPSULE | Freq: Every day | ORAL | 3 refills | Status: DC

## 2023-06-28 MED ORDER — TADALAFIL 10 MG PO TABS: 10.0000 mg | ORAL_TABLET | Freq: Every day | ORAL | 11 refills | Status: DC | PRN

## 2023-06-28 NOTE — Progress Notes (Signed)
   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 Achilles Dunk.  I saw him in 2020 for worsening urinary symptoms and he was noted to have significantly elevated PVRs greater than , cystoscopy showed residual obstructive anterior prostate tissue and a massively distended bladder, urodynamic showed high-pressure voiding with bladder outlet obstruction.  He underwent a TURP in September 2020 and tissue showed <5% involvement of low risk prostate cancer with 3+3=6 disease.  PSA postop has been stable and low, most recently 0.72   Overall he has been doing well since that time.  PVRs have ranged from 200-358ml that we have monitored, but he has not had any hydronephrosis on follow-up ultrasounds.  He remains on Flomax.  PVR today is the lowest it has been, 79ml, and he denies any urinary complaints.  I personally viewed and interpreted the most recent renal ultrasound from November 2024 showing no evidence of hydronephrosis and PVR of .  Using Cialis 5 to 10 mg on demand with only moderate improvement.  We discussed max dose is 20 mg/day and the prescription was increased.   Plan to continue active surveillance for his low risk prostate cancer incidentally found at time of TURP with yearly PSA.  We discussed the very low risk of progression or problems from this in the future.  We discussed return precautions at length including gross hematuria, UTIs, worsening renal function, or incontinence.  -Flomax refilled -Cialis dose increased to 10 to 20 mg on demand -RTC 1 year PVR, consider renal ultrasounds every other  year -With his age and comorbidities, likely does not need ongoing PSA monitoring    Sondra Come, MD  [facility] [address], [address] [place], [place] [address] [phone]

## 2023-06-28 NOTE — Progress Notes (Signed)
MRN : [id number]  Marc Gray is a 78 y.o. (Jan 13, 1946) male who presents with chief complaint of check circulation.  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 The aneurysm was found incidentally by CT scan. Patient denies chest pain or unusual back pain, no other chest or abdominal complaints.  No history of an abrupt onset of a painful toe associated with blue discoloration.      No family history of TAA/AAA.     The patient is also seen for evaluation of painful lower extremities and diminished pulses. Patient notes the pain is always associated with activity and is very consistent day today. Typically, the pain occurs at less than one block, progress is as activity continues to the point that the patient must stop walking. Resting including standing still for several minutes allows the patient to walk a similar distance before being forced to stop again. Uneven terrain and inclines shorten the distance. The pain has been progressive over the past several years. The patient denies any abrupt changes in claudication symptoms.  The patient states the inability to walk is causing problems with daily activities.   The patient denies rest pain or dangling of an extremity off the side of the bed during the night for relief. No open wounds or sores at this time. No prior interventions or surgeries.   No history of back problems or DJD of the lumbar sacral spine.    The patient is also c/o painful lower extremities because of cramps. The leg pain occurs primarily at night while the patient is in bed.   The patient describes it as a cramping or Charley horse type pain. The patient notes the pain isn't associated with activity and is not very consistent day to day. The pain seems to be variable with time. Typically the pain occurs with  varying positions and seems to progress until the leg is stretched. The pain has been progressive over the past several years which has prompted the concern for evaluation. The patient states this inability to walk has a significant negative impact on her quality of life and daily activities.   Patient denies amaurosis fugax or TIA symptoms.    The patient denies angina or shortness of breath.   CT scan dated 04/09/2023 is reviewed by me personally, it shows an ascending TAA that measures 4.1 cm and a descending thoracic aneurysm that measures 3.1 cm.  Of note, there is no evidence for an abdominal aortic aneurysm.  Mild to moderate atherosclerotic changes are throughout the aorta.  CT scan dated 03/27/2023 is also reviewed by me and shows  it shows an ascending TAA that measures 4.1 cm and a descending thoracic aneurysm that measures 3.1 cm.  Of note, there is no evidence for an abdominal aortic aneurysm.  Mild to moderate atherosclerotic changes are throughout the aorta.   ABI Rt=1.12 and Lt=1.00  (no previous ABI's)   No outpatient medications have been marked as taking for the 07/02/23 encounter (Appointment) with Gilda Crease, Latina Craver, MD.  Past Medical History:  Diagnosis Date   Anxiety    Aortic atherosclerosis (HCC)    Arthritis    SHOULDER   BPH (benign prostatic hyperplasia)    Cataract    Chronic kidney disease    low kidney function   Colon adenomas    Colon cancer (HCC) 2015   Pt states during his colonoscopy he had cancerous polyps removed.    COPD (chronic obstructive pulmonary disease) (HCC)    Depression    SINCE DIAGNOSIS   Dyspnea    DOE   Enlarged prostate    GERD (gastroesophageal reflux disease)    Hypertension    Hypothyroidism    Lung cancer (HCC)    Aug 2018   Pain    DUE TO LUNG ISSUE    Past Surgical History:  Procedure Laterality Date   BACK SURGERY     kyphoplasty  T5-6   CATARACT EXTRACTION W/ INTRAOCULAR LENS  IMPLANT, BILATERAL      COLONOSCOPY     COLONOSCOPY WITH PROPOFOL N/A 12/17/2019   Procedure: COLONOSCOPY WITH PROPOFOL;  Surgeon: Earline Mayotte, MD;  Location: [facility];  Service: Endoscopy;  Laterality: N/A;   CORONARY STENT INTERVENTION N/A 12/29/2022   Procedure: CORONARY STENT INTERVENTION;  Surgeon: Marcina Millard, MD;  Location: [facility];  Service: Cardiovascular;  Laterality: N/A;   EYE SURGERY Bilateral    cataract   LEFT HEART CATH AND CORONARY ANGIOGRAPHY N/A 12/29/2022   Procedure: LEFT HEART CATH AND CORONARY ANGIOGRAPHY;  Surgeon: Marcina Millard, MD;  Location: [facility];  Service: Cardiovascular;  Laterality: N/A;   LYMPH GLAND EXCISION N/A 02/09/2017   Procedure: CERVICAL LYMPH NODE BIOPSY;  Surgeon: Earline Mayotte, MD;  Location: [facility];  Service: General;  Laterality: N/A;   PORTACATH PLACEMENT Right 02/23/2017   Procedure: INSERTION PORT-A-CATH;  Surgeon: Earline Mayotte, MD;  Location: [facility];  Service: General;  Laterality: Right;   PROSTATE SURGERY     TONSILLECTOMY     TRANSURETHRAL RESECTION OF PROSTATE N/A 02/14/2019   Procedure: TRANSURETHRAL RESECTION OF THE PROSTATE (TURP);  Surgeon: Sondra Come, MD;  Location: [facility];  Service: Urology;  Laterality: N/A;    Social History Social History   Tobacco Use   Smoking status: Former    Current packs/day: 0.00    Average packs/day: 1 pack/day for 53.0 years (53.0 ttl pk-yrs)    Types: Cigarettes    Start date: 04/14/1964    Quit date: 04/14/2017    Years since quitting: 6.2    Passive exposure: Past   Smokeless tobacco: Never   Tobacco comments:    Quit November 2018  Vaping Use   Vaping status: Never Used  Substance Use Topics   Alcohol use: Not Currently    Comment: occassional   Drug use: No    Family History Family History  Problem Relation Age of Onset   Breast cancer Maternal Grandmother    Dementia Mother    Diabetes Father    Stroke Father     Allergies   Allergen Reactions   Morphine And Codeine      REVIEW OF SYSTEMS (Negative unless checked)  Constitutional: [] Weight loss  [] Fever  [] Chills Cardiac: [] Chest pain   [] Chest pressure   [] Palpitations   [] Shortness of breath when laying flat   [] Shortness of breath with exertion. Vascular:  [x] Pain in legs with walking   [] Pain in legs at rest  [] History of DVT   [] Phlebitis   []   Swelling in legs   [] Varicose veins   [] Non-healing ulcers Pulmonary:   [] Uses home oxygen   [] Productive cough   [] Hemoptysis   [] Wheeze  [] COPD   [] Asthma Neurologic:  [] Dizziness   [] Seizures   [] History of stroke   [] History of TIA  [] Aphasia   [] Vissual changes   [] Weakness or numbness in arm   [] Weakness or numbness in leg Musculoskeletal:   [] Joint swelling   [x] Joint pain   [x] Low back pain Hematologic:  [] Easy bruising  [] Easy bleeding   [] Hypercoagulable state   [] Anemic Gastrointestinal:  [] Diarrhea   [] Vomiting  [] Gastroesophageal reflux/heartburn   [] Difficulty swallowing. Genitourinary:  [x] Chronic kidney disease   [] Difficult urination  [] Frequent urination   [] Blood in urine Skin:  [] Rashes   [] Ulcers  Psychological:  [x] History of anxiety   []  History of major depression.  Physical Examination  There were no vitals filed for this visit. There is no height or weight on file to calculate BMI. Gen: WD/WN, NAD Head: [place]/AT, No temporalis wasting.  Ear/Nose/Throat: Hearing grossly intact, nares w/o erythema or drainage Eyes: PER, EOMI, sclera nonicteric.  Neck: Supple, no masses.  No bruit or JVD.  Pulmonary:  Good air movement, no audible wheezing, no use of accessory muscles.  Cardiac: RRR, normal S1, S2, no Murmurs. Vascular:  mild trophic changes, no open wounds Vessel Right Left  Radial Palpable Palpable  PT Trace  Palpable Trace Palpable  DP Trace Palpable Trace  Palpable  Gastrointestinal: soft, non-distended. No guarding/no peritoneal signs.  Musculoskeletal: M/S 5/5 throughout.  No  visible deformity.  Neurologic: CN 2-12 intact. Pain and light touch intact in extremities.  Symmetrical.  Speech is fluent. Motor exam as listed above. Psychiatric: Judgment intact, Mood & affect appropriate for pt's clinical situation. Dermatologic: No rashes or ulcers noted.  No changes consistent with cellulitis.   CBC Lab Results  Component Value Date   WBC 6.9 04/09/2023   HGB 9.2 (L) 04/09/2023   HCT 31.5 (L) 04/09/2023   MCV 85.4 04/09/2023   PLT 206 04/09/2023    BMET    Component Value Date/Time   NA 137 04/09/2023 0334   NA 143 12/27/2011 0426   K 3.9 04/09/2023 0334   K 4.5 12/27/2011 0426   CL 106 04/09/2023 0334   CL 113 (H) 12/27/2011 0426   CO2 23 04/09/2023 0334   CO2 22 12/27/2011 0426   GLUCOSE 113 (H) 04/09/2023 0334   GLUCOSE 99 12/27/2011 0426   BUN 33 (H) 04/09/2023 0334   BUN 19 (H) 12/27/2011 0426   CREATININE 1.52 (H) 04/09/2023 0334   CREATININE 1.53 (H) 03/28/2023 0959   CREATININE 1.43 (H) 12/27/2011 0426   CALCIUM 8.7 (L) 04/09/2023 0334   CALCIUM 8.1 (L) 12/27/2011 0426   GFRNONAA 47 (L) 04/09/2023 0334   GFRNONAA 47 (L) 03/28/2023 0959   GFRNONAA 51 (L) 12/27/2011 0426   GFRAA 60 (L) 11/07/2019 1250   GFRAA 60 (L) 12/27/2011 0426   CrCl cannot be calculated (Patient's most recent lab result is older than the maximum 21 days allowed.).  COAG Lab Results  Component Value Date   INR 1.1 12/28/2022    Radiology No results found.   Assessment/Plan 1. Aneurysm of ascending aorta without rupture (HCC) (Primary) Recommend:  No surgery or intervention is indicated at this time.  The patient has an asymptomatic thoracic aortic aneurysm that is less than 6.0 cm in maximal diameter.  I have discussed the natural history of thoracic aortic aneurysm and  the small risk of rupture for aneurysm less than 6.5 cm in size.  However, as these small aneurysms tend to enlarge over time, continued surveillance with CT scan is mandatory.   I have  also discussed optimizing medical management with hypertension and lipid control and the negative effect that any tobacco products have on aneurysmal disease.  The patient is also encouraged to exercise a minimum of 30 minutes 4 times a week.   Should the patient develop new onset chest or back pain or signs of peripheral embolization they are instructed to seek medical attention immediately and to alert the physician providing care that they have an aneurysm in the chest.   The patient voices their understanding.  The patient will return as ordered with a CT scan of the chest.  CT will be obtained approximately every 2 to 3 years.  2. Aneurysm of descending thoracic aorta without rupture (HCC) Recommend:  No surgery or intervention is indicated at this time.  The patient has an asymptomatic thoracic aortic aneurysm that is less than 6.0 cm in maximal diameter.  I have discussed the natural history of thoracic aortic aneurysm and the small risk of rupture for aneurysm less than 6.5 cm in size.  However, as these small aneurysms tend to enlarge over time, continued surveillance with CT scan is mandatory.   I have also discussed optimizing medical management with hypertension and lipid control and the negative effect that any tobacco products have on aneurysmal disease.  The patient is also encouraged to exercise a minimum of 30 minutes 4 times a week.   Should the patient develop new onset chest or back pain or signs of peripheral embolization they are instructed to seek medical attention immediately and to alert the physician providing care that they have an aneurysm in the chest.   The patient voices their understanding.  The patient will return as ordered with a CT scan of the chest.  CT will be obtained approximately every 2 to 3 years.  3. PAD (peripheral artery disease) (HCC) Recommend:  I do not find evidence of life style limiting vascular disease. The patient specifically denies  life style limitation.  Previous noninvasive studies including ABI's of the legs do not identify critical vascular problems.  The patient should continue walking and begin a more formal exercise program. The patient should continue his antiplatelet therapy and aggressive treatment of the lipid abnormalities.  The patient is instructed to call the office if there is a significant change in the lower extremity symptoms, particularly if a wound develops or there is an abrupt increase in leg pain.  4. NSTEMI (non-ST elevated myocardial infarction) (HCC) Continue cardiac and antihypertensive medications as already ordered and reviewed, no changes at this time.  Continue statin as ordered and reviewed, no changes at this time  Nitrates PRN for chest pain  5. Stage 3a chronic kidney disease (HCC) The patient has advanced renal disease.  However, at the present time the patient is not yet on dialysis.  Avoid nephrotoxic medications and dehydration.  Further plans per nephrology   Levora Dredge, MD  06/28/2023 11:25 AM

## 2023-07-02 ENCOUNTER — Ambulatory Visit (INDEPENDENT_AMBULATORY_CARE_PROVIDER_SITE_OTHER): Payer: HMO | Admitting: Vascular Surgery

## 2023-07-02 ENCOUNTER — Encounter (INDEPENDENT_AMBULATORY_CARE_PROVIDER_SITE_OTHER): Payer: Self-pay | Admitting: Vascular Surgery

## 2023-07-02 ENCOUNTER — Ambulatory Visit (INDEPENDENT_AMBULATORY_CARE_PROVIDER_SITE_OTHER): Payer: HMO

## 2023-07-02 VITALS — BP 120/71 | HR 77 | Resp 16 | Wt 246.6 lb

## 2023-07-02 DIAGNOSIS — I7123 Aneurysm of the descending thoracic aorta, without rupture: Secondary | ICD-10-CM | POA: Diagnosis not present

## 2023-07-02 DIAGNOSIS — I739 Peripheral vascular disease, unspecified: Secondary | ICD-10-CM

## 2023-07-02 DIAGNOSIS — N1831 Chronic kidney disease, stage 3a: Secondary | ICD-10-CM | POA: Diagnosis not present

## 2023-07-02 DIAGNOSIS — I214 Non-ST elevation (NSTEMI) myocardial infarction: Secondary | ICD-10-CM | POA: Diagnosis not present

## 2023-07-02 DIAGNOSIS — I7121 Aneurysm of the ascending aorta, without rupture: Secondary | ICD-10-CM | POA: Diagnosis not present

## 2023-07-02 LAB — VAS US ABI WITH/WO TBI
Left ABI: 1
Right ABI: 1.12

## 2023-07-03 ENCOUNTER — Encounter (INDEPENDENT_AMBULATORY_CARE_PROVIDER_SITE_OTHER): Payer: Self-pay | Admitting: Vascular Surgery

## 2023-07-03 DIAGNOSIS — J4540 Moderate persistent asthma, uncomplicated: Secondary | ICD-10-CM | POA: Diagnosis not present

## 2023-07-03 DIAGNOSIS — J8283 Eosinophilic asthma: Secondary | ICD-10-CM | POA: Diagnosis not present

## 2023-07-17 DIAGNOSIS — J8283 Eosinophilic asthma: Secondary | ICD-10-CM | POA: Diagnosis not present

## 2023-07-17 DIAGNOSIS — J4540 Moderate persistent asthma, uncomplicated: Secondary | ICD-10-CM | POA: Diagnosis not present

## 2023-07-22 ENCOUNTER — Other Ambulatory Visit: Payer: Self-pay

## 2023-07-22 ENCOUNTER — Emergency Department: Payer: HMO

## 2023-07-22 ENCOUNTER — Inpatient Hospital Stay
Admission: EM | Admit: 2023-07-22 | Discharge: 2023-07-24 | DRG: 871 | Disposition: A | Discharge status: Home / Self Care | Payer: HMO | Attending: Internal Medicine | Admitting: Internal Medicine

## 2023-07-22 DIAGNOSIS — Z833 Family history of diabetes mellitus: Secondary | ICD-10-CM

## 2023-07-22 DIAGNOSIS — J189 Pneumonia, unspecified organism: Secondary | ICD-10-CM | POA: Diagnosis present

## 2023-07-22 DIAGNOSIS — E2740 Unspecified adrenocortical insufficiency: Secondary | ICD-10-CM | POA: Diagnosis present

## 2023-07-22 DIAGNOSIS — A419 Sepsis, unspecified organism: Principal | ICD-10-CM

## 2023-07-22 DIAGNOSIS — Z955 Presence of coronary angioplasty implant and graft: Secondary | ICD-10-CM

## 2023-07-22 DIAGNOSIS — Z961 Presence of intraocular lens: Secondary | ICD-10-CM | POA: Diagnosis present

## 2023-07-22 DIAGNOSIS — E872 Acidosis, unspecified: Secondary | ICD-10-CM | POA: Diagnosis present

## 2023-07-22 DIAGNOSIS — Z85118 Personal history of other malignant neoplasm of bronchus and lung: Secondary | ICD-10-CM

## 2023-07-22 DIAGNOSIS — Z823 Family history of stroke: Secondary | ICD-10-CM

## 2023-07-22 DIAGNOSIS — Z85038 Personal history of other malignant neoplasm of large intestine: Secondary | ICD-10-CM

## 2023-07-22 DIAGNOSIS — I2510 Atherosclerotic heart disease of native coronary artery without angina pectoris: Secondary | ICD-10-CM | POA: Diagnosis present

## 2023-07-22 DIAGNOSIS — E669 Obesity, unspecified: Secondary | ICD-10-CM | POA: Diagnosis present

## 2023-07-22 DIAGNOSIS — R000 Tachycardia, unspecified: Secondary | ICD-10-CM | POA: Diagnosis not present

## 2023-07-22 DIAGNOSIS — J441 Chronic obstructive pulmonary disease with (acute) exacerbation: Secondary | ICD-10-CM | POA: Diagnosis present

## 2023-07-22 DIAGNOSIS — J439 Emphysema, unspecified: Secondary | ICD-10-CM | POA: Diagnosis not present

## 2023-07-22 DIAGNOSIS — Z9079 Acquired absence of other genital organ(s): Secondary | ICD-10-CM

## 2023-07-22 DIAGNOSIS — I10 Essential (primary) hypertension: Secondary | ICD-10-CM

## 2023-07-22 DIAGNOSIS — R918 Other nonspecific abnormal finding of lung field: Secondary | ICD-10-CM | POA: Diagnosis not present

## 2023-07-22 DIAGNOSIS — J440 Chronic obstructive pulmonary disease with acute lower respiratory infection: Secondary | ICD-10-CM | POA: Diagnosis present

## 2023-07-22 DIAGNOSIS — N1831 Chronic kidney disease, stage 3a: Secondary | ICD-10-CM | POA: Diagnosis present

## 2023-07-22 DIAGNOSIS — I129 Hypertensive chronic kidney disease with stage 1 through stage 4 chronic kidney disease, or unspecified chronic kidney disease: Secondary | ICD-10-CM | POA: Diagnosis present

## 2023-07-22 DIAGNOSIS — R0602 Shortness of breath: Secondary | ICD-10-CM | POA: Diagnosis not present

## 2023-07-22 DIAGNOSIS — Z79899 Other long term (current) drug therapy: Secondary | ICD-10-CM

## 2023-07-22 DIAGNOSIS — D509 Iron deficiency anemia, unspecified: Secondary | ICD-10-CM | POA: Diagnosis present

## 2023-07-22 DIAGNOSIS — R059 Cough, unspecified: Secondary | ICD-10-CM | POA: Diagnosis not present

## 2023-07-22 DIAGNOSIS — E039 Hypothyroidism, unspecified: Secondary | ICD-10-CM | POA: Diagnosis present

## 2023-07-22 DIAGNOSIS — Z79890 Hormone replacement therapy (postmenopausal): Secondary | ICD-10-CM

## 2023-07-22 DIAGNOSIS — I700 Atherosclerosis of aorta: Secondary | ICD-10-CM | POA: Diagnosis present

## 2023-07-22 DIAGNOSIS — Z7982 Long term (current) use of aspirin: Secondary | ICD-10-CM

## 2023-07-22 DIAGNOSIS — Z7902 Long term (current) use of antithrombotics/antiplatelets: Secondary | ICD-10-CM

## 2023-07-22 DIAGNOSIS — Z9842 Cataract extraction status, left eye: Secondary | ICD-10-CM

## 2023-07-22 DIAGNOSIS — Z923 Personal history of irradiation: Secondary | ICD-10-CM

## 2023-07-22 DIAGNOSIS — F419 Anxiety disorder, unspecified: Secondary | ICD-10-CM | POA: Diagnosis present

## 2023-07-22 DIAGNOSIS — Z87891 Personal history of nicotine dependence: Secondary | ICD-10-CM

## 2023-07-22 DIAGNOSIS — K219 Gastro-esophageal reflux disease without esophagitis: Secondary | ICD-10-CM | POA: Diagnosis present

## 2023-07-22 DIAGNOSIS — J168 Pneumonia due to other specified infectious organisms: Secondary | ICD-10-CM | POA: Diagnosis not present

## 2023-07-22 DIAGNOSIS — Z9841 Cataract extraction status, right eye: Secondary | ICD-10-CM

## 2023-07-22 DIAGNOSIS — Z1152 Encounter for screening for COVID-19: Secondary | ICD-10-CM

## 2023-07-22 DIAGNOSIS — Z7952 Long term (current) use of systemic steroids: Secondary | ICD-10-CM

## 2023-07-22 LAB — COMPREHENSIVE METABOLIC PANEL
ALT: 21 U/L (ref 0–44)
AST: 20 U/L (ref 15–41)
Albumin: 3.4 g/dL — ABNORMAL LOW (ref 3.5–5.0)
Alkaline Phosphatase: 35 U/L — ABNORMAL LOW (ref 38–126)
Anion gap: 10 (ref 5–15)
BUN: 28 mg/dL — ABNORMAL HIGH (ref 8–23)
CO2: 23 mmol/L (ref 22–32)
Calcium: 8.6 mg/dL — ABNORMAL LOW (ref 8.9–10.3)
Chloride: 104 mmol/L (ref 98–111)
Creatinine, Ser: 1.24 mg/dL (ref 0.61–1.24)
GFR, Estimated: >60 mL/min (ref >60)
Glucose, Bld: 116 mg/dL — ABNORMAL HIGH (ref 70–99)
Potassium: 3.8 mmol/L (ref 3.5–5.1)
Sodium: 137 mmol/L (ref 135–145)
Total Bilirubin: 0.5 mg/dL (ref 0.0–1.2)
Total Protein: 6.3 g/dL — ABNORMAL LOW (ref 6.5–8.1)

## 2023-07-22 LAB — CBC
HCT: 38.1 % — ABNORMAL LOW (ref 39.0–52.0)
Hemoglobin: 11.6 g/dL — ABNORMAL LOW (ref 13.0–17.0)
MCH: 27.3 pg (ref 26.0–34.0)
MCHC: 30.4 g/dL (ref 30.0–36.0)
MCV: 89.6 fL (ref 80.0–100.0)
Platelets: 167 10*3/uL (ref 150–400)
RBC: 4.25 MIL/uL (ref 4.22–5.81)
RDW: 18.3 % — ABNORMAL HIGH (ref 11.5–15.5)
WBC: 10.8 10*3/uL — ABNORMAL HIGH (ref 4.0–10.5)
nRBC: 0.2 % (ref 0.0–0.2)

## 2023-07-22 LAB — PROCALCITONIN: Procalcitonin: <0.1 ng/mL

## 2023-07-22 LAB — RESP PANEL BY RT-PCR (RSV, FLU A&B, COVID)  RVPGX2
Influenza A by PCR: NEGATIVE
Influenza B by PCR: NEGATIVE
Resp Syncytial Virus by PCR: NEGATIVE
SARS Coronavirus 2 by RT PCR: NEGATIVE

## 2023-07-22 LAB — BRAIN NATRIURETIC PEPTIDE: B Natriuretic Peptide: 151.4 pg/mL — ABNORMAL HIGH (ref 0.0–100.0)

## 2023-07-22 LAB — LACTIC ACID, PLASMA
Lactic Acid, Venous: 2.5 mmol/L (ref 0.5–1.9)
Lactic Acid, Venous: 3.3 mmol/L (ref 0.5–1.9)
Lactic Acid, Venous: 2.6 mmol/L (ref 0.5–1.9)
Lactic Acid, Venous: 4.7 mmol/L (ref 0.5–1.9)
Lactic Acid, Venous: 4.7 mmol/L (ref 0.5–1.9)

## 2023-07-22 LAB — TROPONIN I (HIGH SENSITIVITY)
Troponin I (High Sensitivity): 25 ng/L — ABNORMAL HIGH (ref <18)
Troponin I (High Sensitivity): 45 ng/L — ABNORMAL HIGH (ref <18)

## 2023-07-22 MED ORDER — TRAMADOL HCL 50 MG PO TABS: 50.0000 mg | ORAL_TABLET | Freq: Four times a day (QID) | ORAL | Status: DC | PRN

## 2023-07-22 MED ORDER — FLUTICASONE FUROATE-VILANTEROL 200-25 MCG/ACT IN AEPB
1.0000 | INHALATION_SPRAY | Freq: Every day | RESPIRATORY_TRACT | Status: DC
  Administered 2023-07-22 – 2023-07-24 (×3): 1 via RESPIRATORY_TRACT
  Filled 2023-07-22: qty 28

## 2023-07-22 MED ORDER — SODIUM CHLORIDE 0.9 % IV SOLN
500.0000 mg | Freq: Once | INTRAVENOUS | Status: AC
  Administered 2023-07-22: 500 mg via INTRAVENOUS
  Filled 2023-07-22: qty 5

## 2023-07-22 MED ORDER — SODIUM CHLORIDE 0.9 % IV SOLN
2.0000 g | Freq: Once | INTRAVENOUS | Status: AC
  Administered 2023-07-22: 2 g via INTRAVENOUS
  Filled 2023-07-22: qty 20

## 2023-07-22 MED ORDER — MONTELUKAST SODIUM 10 MG PO TABS
10.0000 mg | ORAL_TABLET | Freq: Every day | ORAL | Status: DC
  Administered 2023-07-22 – 2023-07-24 (×3): 10 mg via ORAL
  Filled 2023-07-22 (×3): qty 1

## 2023-07-22 MED ORDER — AMPHETAMINE-DEXTROAMPHETAMINE 10 MG PO TABS
20.0000 mg | ORAL_TABLET | Freq: Every day | ORAL | Status: DC
  Administered 2023-07-22 – 2023-07-24 (×3): 20 mg via ORAL
  Filled 2023-07-22 (×3): qty 2

## 2023-07-22 MED ORDER — ACETAMINOPHEN 325 MG PO TABS: 650.0000 mg | ORAL_TABLET | ORAL | Status: DC | PRN

## 2023-07-22 MED ORDER — METOPROLOL TARTRATE 25 MG PO TABS
12.5000 mg | ORAL_TABLET | Freq: Two times a day (BID) | ORAL | Status: DC
  Administered 2023-07-22 – 2023-07-24 (×5): 12.5 mg via ORAL
  Filled 2023-07-22 (×5): qty 1

## 2023-07-22 MED ORDER — PREDNISONE 20 MG PO TABS
40.0000 mg | ORAL_TABLET | Freq: Every day | ORAL | Status: DC
  Administered 2023-07-22 – 2023-07-23 (×2): 40 mg via ORAL
  Filled 2023-07-22 (×2): qty 2

## 2023-07-22 MED ORDER — FUROSEMIDE 40 MG PO TABS
20.0000 mg | ORAL_TABLET | Freq: Every day | ORAL | Status: DC
  Filled 2023-07-22: qty 1

## 2023-07-22 MED ORDER — PANTOPRAZOLE SODIUM 40 MG PO TBEC
40.0000 mg | DELAYED_RELEASE_TABLET | Freq: Every day | ORAL | Status: DC
  Administered 2023-07-22 – 2023-07-24 (×3): 40 mg via ORAL
  Filled 2023-07-22 (×3): qty 1

## 2023-07-22 MED ORDER — SODIUM CHLORIDE 0.9 % IV BOLUS
1000.0000 mL | Freq: Once | INTRAVENOUS | Status: AC
  Administered 2023-07-22: 1000 mL via INTRAVENOUS

## 2023-07-22 MED ORDER — IPRATROPIUM-ALBUTEROL 0.5-2.5 (3) MG/3ML IN SOLN: 3.0000 mL | Freq: Four times a day (QID) | RESPIRATORY_TRACT | Status: DC | PRN

## 2023-07-22 MED ORDER — BENZONATATE 100 MG PO CAPS
200.0000 mg | ORAL_CAPSULE | Freq: Three times a day (TID) | ORAL | Status: DC | PRN
  Administered 2023-07-22 – 2023-07-24 (×3): 200 mg via ORAL
  Filled 2023-07-22 (×3): qty 2

## 2023-07-22 MED ORDER — IPRATROPIUM-ALBUTEROL 0.5-2.5 (3) MG/3ML IN SOLN
3.0000 mL | Freq: Once | RESPIRATORY_TRACT | Status: AC
  Administered 2023-07-22: 3 mL via RESPIRATORY_TRACT
  Filled 2023-07-22: qty 3

## 2023-07-22 MED ORDER — ACETAMINOPHEN 500 MG PO TABS
1000.0000 mg | ORAL_TABLET | Freq: Once | ORAL | Status: AC
  Administered 2023-07-22: 1000 mg via ORAL
  Filled 2023-07-22: qty 2

## 2023-07-22 MED ORDER — PRASUGREL HCL 10 MG PO TABS
10.0000 mg | ORAL_TABLET | Freq: Every day | ORAL | Status: DC
  Administered 2023-07-22 – 2023-07-24 (×3): 10 mg via ORAL
  Filled 2023-07-22 (×3): qty 1

## 2023-07-22 MED ORDER — TRAZODONE HCL 50 MG PO TABS
100.0000 mg | ORAL_TABLET | Freq: Every day | ORAL | Status: DC
  Administered 2023-07-22 – 2023-07-23 (×2): 100 mg via ORAL
  Filled 2023-07-22 (×2): qty 2

## 2023-07-22 MED ORDER — SODIUM CHLORIDE 0.9 % IV SOLN
2.0000 g | INTRAVENOUS | Status: DC
  Administered 2023-07-23 – 2023-07-24 (×2): 2 g via INTRAVENOUS
  Filled 2023-07-22 (×2): qty 20

## 2023-07-22 MED ORDER — ENOXAPARIN SODIUM 60 MG/0.6ML IJ SOSY
55.0000 mg | PREFILLED_SYRINGE | INTRAMUSCULAR | Status: DC
  Administered 2023-07-22 – 2023-07-23 (×2): 55 mg via SUBCUTANEOUS
  Filled 2023-07-22 (×2): qty 0.6

## 2023-07-22 MED ORDER — MELATONIN 5 MG PO TABS
10.0000 mg | ORAL_TABLET | Freq: Every day | ORAL | Status: DC
  Administered 2023-07-22 – 2023-07-23 (×2): 10 mg via ORAL
  Filled 2023-07-22 (×2): qty 2

## 2023-07-22 MED ORDER — BUDESON-GLYCOPYRROL-FORMOTEROL 160-9-4.8 MCG/ACT IN AERO: 2.0000 | INHALATION_SPRAY | Freq: Two times a day (BID) | RESPIRATORY_TRACT | Status: DC

## 2023-07-22 MED ORDER — ASPIRIN 81 MG PO TBEC
81.0000 mg | DELAYED_RELEASE_TABLET | Freq: Every day | ORAL | Status: DC
  Administered 2023-07-22 – 2023-07-24 (×3): 81 mg via ORAL
  Filled 2023-07-22 (×3): qty 1

## 2023-07-22 MED ORDER — FLUTICASONE PROPIONATE 50 MCG/ACT NA SUSP: 1.0000 | Freq: Every day | NASAL | Status: DC | PRN

## 2023-07-22 MED ORDER — UMECLIDINIUM BROMIDE 62.5 MCG/ACT IN AEPB
1.0000 | INHALATION_SPRAY | Freq: Every day | RESPIRATORY_TRACT | Status: DC
  Administered 2023-07-22 – 2023-07-24 (×3): 1 via RESPIRATORY_TRACT
  Filled 2023-07-22: qty 7

## 2023-07-22 MED ORDER — ROSUVASTATIN CALCIUM 10 MG PO TABS
5.0000 mg | ORAL_TABLET | Freq: Every day | ORAL | Status: DC
  Administered 2023-07-22 – 2023-07-24 (×3): 5 mg via ORAL
  Filled 2023-07-22 (×3): qty 1

## 2023-07-22 MED ORDER — LEVOTHYROXINE SODIUM 137 MCG PO TABS
137.0000 ug | ORAL_TABLET | Freq: Every day | ORAL | Status: DC
  Administered 2023-07-23 – 2023-07-24 (×2): 137 ug via ORAL
  Filled 2023-07-22 (×2): qty 1

## 2023-07-22 MED ORDER — GABAPENTIN 100 MG PO CAPS
100.0000 mg | ORAL_CAPSULE | Freq: Three times a day (TID) | ORAL | Status: DC
Start: 2023-07-22 — End: 2023-07-24
  Administered 2023-07-22 – 2023-07-24 (×6): 100 mg via ORAL
  Filled 2023-07-22 (×6): qty 1

## 2023-07-22 MED ORDER — METHYLPREDNISOLONE SODIUM SUCC 125 MG IJ SOLR
125.0000 mg | Freq: Once | INTRAMUSCULAR | Status: AC
  Administered 2023-07-22: 125 mg via INTRAVENOUS
  Filled 2023-07-22: qty 2

## 2023-07-22 MED ORDER — AZITHROMYCIN 500 MG PO TABS
500.0000 mg | ORAL_TABLET | Freq: Every day | ORAL | Status: DC
  Administered 2023-07-23 – 2023-07-24 (×2): 500 mg via ORAL
  Filled 2023-07-22 (×2): qty 1

## 2023-07-22 MED ORDER — GUAIFENESIN ER 600 MG PO TB12
1200.0000 mg | ORAL_TABLET | Freq: Two times a day (BID) | ORAL | Status: DC
  Administered 2023-07-22 – 2023-07-24 (×5): 1200 mg via ORAL
  Filled 2023-07-22 (×5): qty 2

## 2023-07-22 MED ORDER — TAMSULOSIN HCL 0.4 MG PO CAPS
0.4000 mg | ORAL_CAPSULE | Freq: Every day | ORAL | Status: DC
  Administered 2023-07-22 – 2023-07-24 (×3): 0.4 mg via ORAL
  Filled 2023-07-22 (×3): qty 1

## 2023-07-22 MED ORDER — PROCHLORPERAZINE MALEATE 10 MG PO TABS: 10.0000 mg | ORAL_TABLET | Freq: Four times a day (QID) | ORAL | Status: DC | PRN

## 2023-07-22 NOTE — ED Notes (Signed)
 Pt oxygen increased to 3L due to O2 sats at 92%

## 2023-07-22 NOTE — ED Notes (Signed)
 Pt placed on 2L O2 for work of breathing

## 2023-07-22 NOTE — Sepsis Progress Note (Signed)
 Elink following code sepsis

## 2023-07-22 NOTE — Progress Notes (Signed)
 Dropped paperwork off-educated pt on documents and asked if he had any questions for me-he shared he wanted time to go over the document with his wife and I informed him-when he was ready we can come back and help complete the forms.

## 2023-07-22 NOTE — H&P (Signed)
 History and Physical    Marc Gray ZOX:[id number] DOB: 1945-08-14 DOA: 07/22/2023  PCP: [name], MD (Confirm with patient/family/NH records and if not entered, this has to be entered at [employer] point of entry) Patient coming from: Home  I have personally briefly reviewed patient's old medical records in [facility]  Chief Complaint:   HPI: Marc Gray is a 78 y.o. 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  Symptoms started 3 days ago patient started develop productive cough with yellowish sputum, associated with increasing exertional dyspnea, has had subjective fever and chills at home.  Denies any chest pain.  Started to wheeze yesterday and overnight cannot sleep and come to ED. ED Course: Tachycardia blood pressure 95/58 responded to 1 L IV bolus, O2 saturation 96% on 2 L.  WBC 10.8, hemoglobin 11.6, creatinine 1.2, glucose 116, BUN 28.  Chest x-ray showed left lower lobe infiltrates indicating for pneumonia.  Patient was given breathing treatment and ceftriaxone and azithromycin  Review of Systems: As per HPI otherwise 14 point review of systems negative.    Past Medical History:  Diagnosis Date   Anxiety    Aortic atherosclerosis (HCC)    Arthritis    SHOULDER   BPH (benign prostatic hyperplasia)    Cataract    Chronic kidney disease    low kidney function   Colon adenomas    Colon cancer (HCC) 2015   Pt states during his colonoscopy he had cancerous polyps removed.    COPD (chronic obstructive pulmonary disease) (HCC)    Depression    SINCE DIAGNOSIS   Dyspnea    DOE    Enlarged prostate    GERD (gastroesophageal reflux disease)    Hypertension    Hypothyroidism    Lung cancer (HCC)    Aug 2018   Pain    DUE TO LUNG ISSUE    Past Surgical History:  Procedure Laterality Date   BACK SURGERY     kyphoplasty  T5-6   CATARACT EXTRACTION W/ INTRAOCULAR LENS  IMPLANT, BILATERAL     COLONOSCOPY     COLONOSCOPY WITH PROPOFOL N/A 12/17/2019   Procedure: COLONOSCOPY WITH PROPOFOL;  Surgeon: Earline Mayotte, MD;  Location: [facility] ENDOSCOPY;  Service: Endoscopy;  Laterality: N/A;   CORONARY STENT INTERVENTION N/A 12/29/2022   Procedure: CORONARY STENT INTERVENTION;  Surgeon: Marcina Millard, MD;  Location: [facility] INVASIVE CV LAB;  Service: Cardiovascular;  Laterality: N/A;   EYE SURGERY Bilateral    cataract   LEFT HEART CATH AND CORONARY ANGIOGRAPHY N/A 12/29/2022   Procedure: LEFT HEART CATH AND CORONARY ANGIOGRAPHY;  Surgeon: Marcina Millard, MD;  Location: [facility] INVASIVE CV LAB;  Service: Cardiovascular;  Laterality: N/A;   LYMPH GLAND EXCISION N/A 02/09/2017   Procedure: CERVICAL LYMPH NODE BIOPSY;  Surgeon: Earline Mayotte, MD;  Location: [facility] ORS;  Service: General;  Laterality: N/A;   PORTACATH PLACEMENT Right 02/23/2017   Procedure: INSERTION PORT-A-CATH;  Surgeon: Earline Mayotte, MD;  Location: [facility] ORS;  Service: General;  Laterality: Right;   PROSTATE SURGERY     TONSILLECTOMY     TRANSURETHRAL RESECTION OF PROSTATE N/A 02/14/2019   Procedure: TRANSURETHRAL RESECTION  OF THE PROSTATE (TURP);  Surgeon: Sondra Come, MD;  Location: [facility] ORS;  Service: Urology;  Laterality: N/A;     reports that he quit smoking about 6 years ago. His smoking use included cigarettes. He started smoking about 59 years ago. He has a 53 pack-year smoking history. He has been exposed to tobacco smoke. He has never used smokeless tobacco. He reports that he does not currently use alcohol. He reports that he does not use drugs.  Allergies  Allergen Reactions    Morphine And Codeine     Family History  Problem Relation Age of Onset   Breast cancer Maternal Grandmother    Dementia Mother    Diabetes Father    Stroke Father      Prior to Admission medications   Medication Sig Start Date End Date Taking? Authorizing Provider  acetaminophen (TYLENOL) 650 MG CR tablet Take 1,300 mg by mouth every 8 (eight) hours as needed for pain.    [provider]  amphetamine-dextroamphetamine (ADDERALL) 20 MG tablet Take by mouth. 05/03/23 08/01/23  [provider]  aspirin EC 81 MG tablet Take 1 tablet (81 mg total) by mouth daily. Swallow whole. 03/22/21   Rickard Patience, MD  Azelastine HCl 137 MCG/SPRAY SOLN SMARTSIG:1-2 Spray(s) Both Nares Twice Daily 09/15/19   [provider]  BREZTRI AEROSPHERE 160-9-4.8 MCG/ACT AERO Inhale 2 puffs into the lungs 2 (two) times daily. 02/11/20   [provider]  calcium-vitamin D (OSCAL WITH D) 500-200 MG-UNIT tablet Take 1 tablet by mouth daily. 06/25/18   Rickard Patience, MD  DUPIXENT 300 MG/2ML SOPN Inject 300 mg into the skin every 14 (fourteen) days. 11/15/22   [provider]  EPINEPHrine 0.3 mg/0.3 mL IJ SOAJ injection Inject 0.3 mg into the muscle as needed. 02/08/22   [provider]  fluticasone (FLONASE) 50 MCG/ACT nasal spray Place 1 spray into both nostrils daily. 09/25/18   Rickard Patience, MD  furosemide (LASIX) 20 MG tablet Take by mouth. 06/09/20   [provider]  gabapentin (NEURONTIN) 100 MG capsule Take 1 capsule (100 mg total) by mouth 3 (three) times daily. 06/07/23 09/05/23  Edward Jolly, MD  Ipratropium-Albuterol (COMBIVENT) 20-100 MCG/ACT AERS respimat Inhale into the lungs. 11/26/19   [provider]  levothyroxine (SYNTHROID) 137 MCG tablet Take by mouth. 03/01/23   [provider]  Melatonin 10 MG TABS Take 10 mg by mouth at bedtime.     [provider]  metoprolol tartrate (LOPRESSOR) 25 MG tablet Take 0.5 tablets (12.5 mg total) by  mouth 2 (two) times daily. 12/30/22   Darlin Priestly, MD  montelukast (SINGULAIR) 10 MG tablet Take 10 mg by mouth daily. 10/06/21   [provider]  Multiple Vitamin (MULTI-VITAMINS) TABS Take 1 tablet by mouth daily.     [provider]  mupirocin ointment (BACTROBAN) 2 % Apply 1 Application topically daily. 05/28/23   [provider]  pantoprazole (PROTONIX) 40 MG tablet Take 1 tablet (40 mg total) by mouth daily. 06/04/19   Rickard Patience, MD  prasugrel (EFFIENT) 10 MG TABS tablet Take 1 tablet (10 mg total) by mouth daily. 12/30/22 12/30/23  Darlin Priestly, MD  predniSONE (DELTASONE) 5 MG tablet TAKE TWO TABLETS BY MOUTH IN THE MORNING AND TAKE ONE-HALF Tablets  BY MOUTH IN THE AFTERNOON 07/26/20   [provider]  prochlorperazine (COMPAZINE) 10 MG tablet Take 1 tablet (10 mg total) by mouth every 6 (six) hours as needed for nausea or vomiting.  11/13/17   Rickard Patience, MD  rosuvastatin (CRESTOR) 10 MG tablet  08/29/21   [provider]  tadalafil (CIALIS) 10 MG tablet Take 1-2 tablets (10-20 mg total) by mouth daily as needed for erectile dysfunction. Take 5-10mg  by mouth as needed prior to intercourse 06/28/23   Sondra Come, MD  tamsulosin (FLOMAX) 0.4 MG CAPS capsule Take 1 capsule (0.4 mg total) by mouth daily. 06/28/23   Sondra Come, MD  traMADol (ULTRAM) 50 MG tablet Take 50 mg by mouth every 6 (six) hours as needed for moderate pain (pain score 4-6). If he knows that he is going to be on his feet he will take a tablet to combat the pain    [provider]  traZODone (DESYREL) 100 MG tablet Take 100 mg by mouth at bedtime. 09/16/20   [provider]    Physical Exam: Vitals:   07/22/23 1000 07/22/23 1155 07/22/23 1200 07/22/23 1330  BP: (!) 95/50 99/67 (!) 91/53 105/68  Pulse: (!) 113 (!) 108 (!) 106 96  Resp: (!) 23 (!) 26 (!) 24 (!) 22  Temp:      SpO2: 97% 94% 96% 96%  Weight:      Height:        Constitutional: NAD, calm,  comfortable Vitals:   07/22/23 1000 07/22/23 1155 07/22/23 1200 07/22/23 1330  BP: (!) 95/50 99/67 (!) 91/53 105/68  Pulse: (!) 113 (!) 108 (!) 106 96  Resp: (!) 23 (!) 26 (!) 24 (!) 22  Temp:      SpO2: 97% 94% 96% 96%  Weight:      Height:       Eyes: PERRL, lids and conjunctivae normal ENMT: Mucous membranes are moist. Posterior pharynx clear of any exudate or lesions.Normal dentition.  Neck: normal, supple, no masses, no thyromegaly Respiratory: Diminished breathing sound bilaterally, predominantly crackles on left side, scattered wheezing, increasing respiratory effort. No accessory muscle use.  Cardiovascular: Regular rate and rhythm, no murmurs / rubs / gallops. No extremity edema. 2+ pedal pulses. No carotid bruits.  Abdomen: no tenderness, no masses palpated. No hepatosplenomegaly. Bowel sounds positive.  Musculoskeletal: no clubbing / cyanosis. No joint deformity upper and lower extremities. Good ROM, no contractures. Normal muscle tone.  Skin: no rashes, lesions, ulcers. No induration Neurologic: CN 2-12 grossly intact. Sensation intact, DTR normal. Strength 5/5 in all 4.  Psychiatric: Normal judgment and insight. Alert and oriented x 3. Normal mood.     Labs on Admission: I have personally reviewed following labs and imaging studies  CBC: Recent Labs  Lab 07/22/23 0934  WBC 10.8*  HGB 11.6*  HCT 38.1*  MCV 89.6  PLT 167   Basic Metabolic Panel: Recent Labs  Lab 07/22/23 0934  NA 137  K 3.8  CL 104  CO2 23  GLUCOSE 116*  BUN 28*  CREATININE 1.24  CALCIUM 8.6*   GFR: Estimated Creatinine Clearance: 66.2 mL/min (by C-G formula based on SCr of 1.24 mg/dL). Liver Function Tests: Recent Labs  Lab 07/22/23 0934  AST 20  ALT 21  ALKPHOS 35*  BILITOT 0.5  PROT 6.3*  ALBUMIN 3.4*   No results for input(s): "LIPASE", "AMYLASE" in the last 168 hours. No results for input(s): "AMMONIA" in the last 168 hours. Coagulation Profile: No results for input(s):  "INR", "PROTIME" in the last 168 hours. Cardiac Enzymes: No results for input(s): "CKTOTAL", "CKMB", "CKMBINDEX", "TROPONINI" in the last 168 hours. BNP (last 3 results) No results for input(s): "PROBNP"  in the last 8760 hours. HbA1C: No results for input(s): "HGBA1C" in the last 72 hours. CBG: No results for input(s): "GLUCAP" in the last 168 hours. Lipid Profile: No results for input(s): "CHOL", "HDL", "LDLCALC", "TRIG", "CHOLHDL", "LDLDIRECT" in the last 72 hours. Thyroid Function Tests: No results for input(s): "TSH", "T4TOTAL", "FREET4", "T3FREE", "THYROIDAB" in the last 72 hours. Anemia Panel: No results for input(s): "VITAMINB12", "FOLATE", "FERRITIN", "TIBC", "IRON", "RETICCTPCT" in the last 72 hours. Urine analysis:    Component Value Date/Time   COLORURINE YELLOW (A) 02/07/2019 1049   APPEARANCEUR CLEAR (A) 02/07/2019 1049   APPEARANCEUR Clear 02/05/2019 1325   LABSPEC 1.014 02/07/2019 1049   LABSPEC 1.008 09/27/2011 1748   PHURINE 6.0 02/07/2019 1049   GLUCOSEU NEGATIVE 02/07/2019 1049   GLUCOSEU Negative 09/27/2011 1748   HGBUR NEGATIVE 02/07/2019 1049   BILIRUBINUR NEGATIVE 02/07/2019 1049   BILIRUBINUR Negative 02/05/2019 1325   BILIRUBINUR Negative 09/27/2011 1748   KETONESUR NEGATIVE 02/07/2019 1049   PROTEINUR NEGATIVE 02/07/2019 1049   NITRITE NEGATIVE 02/07/2019 1049   LEUKOCYTESUR NEGATIVE 02/07/2019 1049   LEUKOCYTESUR Negative 09/27/2011 1748    Radiological Exams on Admission: DG Chest 2 View Result Date: 07/22/2023 CLINICAL DATA:  Shortness of breath. Cough. Prior left lung carcinoma. EXAM: CHEST - 2 VIEW COMPARISON:  04/09/2019 FINDINGS: Heart size within normal limits. Stable posttreatment changes seen in the left hemithorax. New airspace opacity is seen in the left lower lung suspicious for pneumonia. Right lung remains clear. IMPRESSION: New airspace opacity in left lower lung, suspicious for pneumonia. Electronically Signed   By: Danae Orleans M.D.    On: 07/22/2023 09:35    EKG: Independently reviewed.  Sinus rhythm, frequent PVCs, no acute ST changes.  Assessment/Plan Principal Problem:   Pneumonia Active Problems:   LLL pneumonia  (please populate well all problems here in Problem List. (For example, if patient is on BP meds at home and you resume or decide to hold them, it is a problem that needs to be her. Same for CAD, COPD, HLD and so on)  Sepsis -Without acute endorgan damage.  Sepsis evidenced by tachycardia, hypotensive and responded to initial IV bolus in the ED.  Source of infection is left lower lobe CAP, bacterial -Patient appears to be euvolemic, monitor monitor off maintenance IV fluid -Continue antibiotics coverage ceftriaxone and azithromycin.  Low suspicion for aspiration at this point.  Recommend repeat chest x-ray in 5 to 6 weeks to document resolution.  LLL CAP, bacterial -Antibiotics as above -Incentive spirometry and flutter valve -Culture sputum -Breathing treatment  Acute COPD exacerbation -As above -Short course of steroid p.o.  Chronic adrenal insufficiency -Will use higher dose steroid for COPD treatment for 5 days then switch back to home dose of prednisone  CAD -No acute concern  History of stage Gray lung cancer -Status post XRT, chemo and immunotherapy in 2020 -Considered to be cured after 5 years without relapse.  HTN -Hold off Lasix -Continue low-dose metoprolol  DVT prophylaxis: Lovenox Code Status: Full code Family Communication: Wife at bedside Disposition Plan: Expect less than 2 midnight hospital stay Consults called: None Admission status: Telemetry atrial fibrillation   Emeline General MD [facility] Pager [phone]  07/22/2023, 2:09 PM

## 2023-07-22 NOTE — Progress Notes (Signed)
 CODE SEPSIS - PHARMACY COMMUNICATION  **Broad Spectrum Antibiotics should be administered within 1 hour of Sepsis diagnosis**  Time Code Sepsis Called/Page Received: 2/23 @ 1101   Antibiotics Ordered:  Ceftriaxone 2g x 1 Azithromycin 500 mg x 1  Time of 1st antibiotic administration: 2/23 @ 1111  Additional action taken by pharmacy: NA  If necessary, Name of Provider/Nurse Contacted: NA  Effie Shy, PharmD Pharmacy Resident  07/22/2023 11:12 AM

## 2023-07-22 NOTE — Plan of Care (Signed)

## 2023-07-22 NOTE — ED Triage Notes (Addendum)
 Pt comes with sob for several weeks. Pt states cough congestion. Pt denies any fever. Pt states sob all the  time especially with ambulating. Pt has been stumbling per wife.   Pt denies any cp. Pt stats some dizziness the other day.  Pt has labored breathing noted and rales.

## 2023-07-22 NOTE — ED Provider Notes (Addendum)
 [facility] Provider Note    Event Date/Time   First MD Initiated Contact with Patient 07/22/23 0915     (approximate)   History   Shortness of Breath   HPI  Marc Gray is a 78 y.o. male with history of coronary disease, status post stents on prasugrel, asthma, lung cancer, CKD who comes in with multiple symptoms.  Patient reports having some shortness of breath over the past few months but reports over the past 2 days has been having chills, increased coughing and increasing in shortness of breath.  He does report a little bit of swelling in his abdomen.  He reports been compliant with all of his medications.  He denies really any chest pain.   I reviewed the records and on 04/09/2023 patient had a CT scan negative for PE and fibrosis of his lung but no evidence of recurrent or metastatic disease.  Was also noted to have emphysema.      Physical Exam   Triage Vital Signs: ED Triage Vitals  Encounter Vitals Group     BP 07/22/23 0911 123/64     Systolic BP Percentile --      Diastolic BP Percentile --      Pulse Rate 07/22/23 0911 (!) 128     Resp 07/22/23 0911 (!) 22     Temp 07/22/23 0911 98.2 F (36.8 C)     Temp src --      SpO2 07/22/23 0911 96 %     Weight 07/22/23 0910 245 lb (111.1 kg)     Height 07/22/23 0910 6\' 1"  (1.854 m)     Head Circumference --      Peak Flow --      Pain Score 07/22/23 0910 0     Pain Loc --      Pain Education --      Exclude from Growth Chart --     Most recent vital signs: Vitals:   07/22/23 0911  BP: 123/64  Pulse: (!) 128  Resp: (!) 22  Temp: 98.2 F (36.8 C)  SpO2: 96%     General: Awake, no distress.  CV:  Good peripheral perfusion.  Resp:  Normal effort.  Wheezing noted Abd:  No distention.  Soft nontender Other:  Trace edema, no tenderness    ED Results / Procedures / Treatments   Labs (all labs ordered are listed, but only abnormal results are displayed) Labs  Reviewed  BASIC METABOLIC PANEL  CBC     EKG  My interpretation of EKG:  Sinus tachycardia rate 120 without any ST elevation or T wave versions occasional PVCs  RADIOLOGY I have reviewed the xray personally and interpreted left lower lobe pneumonia   PROCEDURES:  Critical Care performed: Yes, see critical care procedure note(s)  .1-3 Lead EKG Interpretation  Performed by: Concha Se, MD Authorized by: Concha Se, MD     Interpretation: abnormal     ECG rate:  105   ECG rate assessment: tachycardic     Rhythm: sinus tachycardia     Ectopy: none     Conduction: normal   .Critical Care  Performed by: Concha Se, MD Authorized by: Concha Se, MD   Critical care provider statement:    Critical care time (minutes):  30   Critical care was necessary to treat or prevent imminent or life-threatening deterioration of the following conditions:  Sepsis   Critical care was time spent personally by  me on the following activities:  Development of treatment plan with patient or surrogate, discussions with consultants, evaluation of patient's response to treatment, examination of patient, ordering and review of laboratory studies, ordering and review of radiographic studies, ordering and performing treatments and interventions, pulse oximetry, re-evaluation of patient's condition and review of old charts    MEDICATIONS ORDERED IN ED: Medications  cefTRIAXone (ROCEPHIN) 2 g in sodium chloride 0.9 % 100 mL IVPB (2 g Intravenous New Bag/Given 07/22/23 1111)  azithromycin (ZITHROMAX) 500 mg in sodium chloride 0.9 % 250 mL IVPB (has no administration in time range)  sodium chloride 0.9 % bolus 1,000 mL (has no administration in time range)  ipratropium-albuterol (DUONEB) 0.5-2.5 (3) MG/3ML nebulizer solution 3 mL (has no administration in time range)  methylPREDNISolone sodium succinate (SOLU-MEDROL) 125 mg/2 mL injection 125 mg (has no administration in time range)   acetaminophen (TYLENOL) tablet 1,000 mg (1,000 mg Oral Given 07/22/23 0951)  sodium chloride 0.9 % bolus 1,000 mL (1,000 mLs Intravenous New Bag/Given 07/22/23 1032)     IMPRESSION / MDM / ASSESSMENT AND PLAN / ED COURSE  I reviewed the triage vital signs and the nursing notes.   Patient's presentation is most consistent with acute presentation with potential threat to life or bodily function.   Patient comes in with shortness of breath differential includes COPD, fluid overload, pneumonia, COVID, flu.  COVID, flu were negative.  Chest x-ray consistent with pneumonia left lower lobe.  Patient was given some fluids and BNP was only slightly elevated.  He tolerated fluids well.  Heart rates have come down from the 120s to low 100s.  His sinus tach.  I consider the possibility of PE but given pneumonia noted on x-ray this seems more likely at this time.  Discussed with patient given patient's work of breathing, tachycardia being admitted for observation for his pneumonia.  Patient was pretty tachypneic when he initially came to the emergency room with little bit of ambulation.  Now that he is resting in bed he appears more comfortable.  I will give a second liter of fluid start patient on antibiotics.  I will discuss with the hospital team for admission.  Discussed with patient that these not getting better to that they could consider CT imaging but at this time I suspicion is most likely left lower lobe pneumonia.  Troponin slightly elevated I suspect more likely demand.  His lactate is rising therefore patient is getting second liter of fluid.  Third lactate was ordered.  The patient was placed on 2 L but does not have any significant worsening shortness of breath.  No significant increased work of breathing.  The patient is on the cardiac monitor to evaluate for evidence of arrhythmia and/or significant heart rate changes.      FINAL CLINICAL IMPRESSION(S) / ED DIAGNOSES   Final diagnoses:   Sepsis, due to unspecified organism, unspecified whether acute organ dysfunction present [facility])  Pulmonary emphysema, unspecified emphysema type (HCC)  Pneumonia of left lung due to infectious organism, unspecified part of lung     Rx / DC Orders   ED Discharge Orders     None        Note:  This document was prepared using Dragon voice recognition software and may include unintentional dictation errors.   Concha Se, MD 07/22/23 1113    Concha Se, MD 07/22/23 [phone]

## 2023-07-22 NOTE — Progress Notes (Signed)
 Patient is alert and oriented X 4. Lactic acid is 4.7, MD  made aware. New order received. Plan of care ongoing.

## 2023-07-23 ENCOUNTER — Other Ambulatory Visit: Payer: Self-pay

## 2023-07-23 ENCOUNTER — Telehealth: Payer: Self-pay | Admitting: *Deleted

## 2023-07-23 DIAGNOSIS — J439 Emphysema, unspecified: Secondary | ICD-10-CM

## 2023-07-23 DIAGNOSIS — I129 Hypertensive chronic kidney disease with stage 1 through stage 4 chronic kidney disease, or unspecified chronic kidney disease: Secondary | ICD-10-CM | POA: Diagnosis not present

## 2023-07-23 DIAGNOSIS — Z7982 Long term (current) use of aspirin: Secondary | ICD-10-CM | POA: Diagnosis not present

## 2023-07-23 DIAGNOSIS — R0602 Shortness of breath: Secondary | ICD-10-CM | POA: Diagnosis not present

## 2023-07-23 DIAGNOSIS — Z823 Family history of stroke: Secondary | ICD-10-CM | POA: Diagnosis not present

## 2023-07-23 DIAGNOSIS — E872 Acidosis, unspecified: Secondary | ICD-10-CM | POA: Diagnosis not present

## 2023-07-23 DIAGNOSIS — I2510 Atherosclerotic heart disease of native coronary artery without angina pectoris: Secondary | ICD-10-CM | POA: Diagnosis not present

## 2023-07-23 DIAGNOSIS — E669 Obesity, unspecified: Secondary | ICD-10-CM | POA: Diagnosis not present

## 2023-07-23 DIAGNOSIS — Z79890 Hormone replacement therapy (postmenopausal): Secondary | ICD-10-CM | POA: Diagnosis not present

## 2023-07-23 DIAGNOSIS — Z7902 Long term (current) use of antithrombotics/antiplatelets: Secondary | ICD-10-CM | POA: Diagnosis not present

## 2023-07-23 DIAGNOSIS — I700 Atherosclerosis of aorta: Secondary | ICD-10-CM | POA: Diagnosis not present

## 2023-07-23 DIAGNOSIS — Z955 Presence of coronary angioplasty implant and graft: Secondary | ICD-10-CM | POA: Diagnosis not present

## 2023-07-23 DIAGNOSIS — A419 Sepsis, unspecified organism: Secondary | ICD-10-CM

## 2023-07-23 DIAGNOSIS — J168 Pneumonia due to other specified infectious organisms: Secondary | ICD-10-CM | POA: Diagnosis not present

## 2023-07-23 DIAGNOSIS — Z87891 Personal history of nicotine dependence: Secondary | ICD-10-CM | POA: Diagnosis not present

## 2023-07-23 DIAGNOSIS — D472 Monoclonal gammopathy: Secondary | ICD-10-CM

## 2023-07-23 DIAGNOSIS — R000 Tachycardia, unspecified: Secondary | ICD-10-CM | POA: Diagnosis not present

## 2023-07-23 DIAGNOSIS — J189 Pneumonia, unspecified organism: Secondary | ICD-10-CM | POA: Diagnosis not present

## 2023-07-23 DIAGNOSIS — E039 Hypothyroidism, unspecified: Secondary | ICD-10-CM | POA: Diagnosis not present

## 2023-07-23 DIAGNOSIS — Z833 Family history of diabetes mellitus: Secondary | ICD-10-CM | POA: Diagnosis not present

## 2023-07-23 DIAGNOSIS — C3492 Malignant neoplasm of unspecified part of left bronchus or lung: Secondary | ICD-10-CM

## 2023-07-23 DIAGNOSIS — R918 Other nonspecific abnormal finding of lung field: Secondary | ICD-10-CM | POA: Diagnosis not present

## 2023-07-23 DIAGNOSIS — Z923 Personal history of irradiation: Secondary | ICD-10-CM | POA: Diagnosis not present

## 2023-07-23 DIAGNOSIS — Z1152 Encounter for screening for COVID-19: Secondary | ICD-10-CM | POA: Diagnosis not present

## 2023-07-23 DIAGNOSIS — R059 Cough, unspecified: Secondary | ICD-10-CM | POA: Diagnosis not present

## 2023-07-23 DIAGNOSIS — J440 Chronic obstructive pulmonary disease with acute lower respiratory infection: Secondary | ICD-10-CM | POA: Diagnosis not present

## 2023-07-23 DIAGNOSIS — N1831 Chronic kidney disease, stage 3a: Secondary | ICD-10-CM | POA: Diagnosis not present

## 2023-07-23 DIAGNOSIS — E2740 Unspecified adrenocortical insufficiency: Secondary | ICD-10-CM | POA: Diagnosis not present

## 2023-07-23 DIAGNOSIS — J441 Chronic obstructive pulmonary disease with (acute) exacerbation: Secondary | ICD-10-CM | POA: Diagnosis not present

## 2023-07-23 DIAGNOSIS — D509 Iron deficiency anemia, unspecified: Secondary | ICD-10-CM | POA: Diagnosis not present

## 2023-07-23 DIAGNOSIS — Z9079 Acquired absence of other genital organ(s): Secondary | ICD-10-CM | POA: Diagnosis not present

## 2023-07-23 DIAGNOSIS — I10 Essential (primary) hypertension: Secondary | ICD-10-CM | POA: Diagnosis not present

## 2023-07-23 LAB — EXPECTORATED SPUTUM ASSESSMENT W GRAM STAIN, RFLX TO RESP C

## 2023-07-23 LAB — STREP PNEUMONIAE URINARY ANTIGEN: Strep Pneumo Urinary Antigen: NEGATIVE

## 2023-07-23 MED ORDER — PREDNISONE 20 MG PO TABS
20.0000 mg | ORAL_TABLET | Freq: Every day | ORAL | Status: DC
  Administered 2023-07-24: 20 mg via ORAL
  Filled 2023-07-23: qty 1

## 2023-07-23 NOTE — Progress Notes (Signed)
 [facility]  - [place] [facility]   PATIENT NAME: Marc Gray    MR#:  [id number]  DATE OF BIRTH:  18-Jun-1945  SUBJECTIVE:  patient sitting out in the chair. No family at bedside. Tells me his had cough for to three weeks. Got short winded found to have left lower lobe pneumonia. Feels overall lot better. Tolerating PO diet. No fever.    VITALS:  Blood pressure 120/65, pulse 75, temperature 97.8 F (36.6 C), temperature source Oral, resp. rate 16, height 6\' 1"  (1.854 m), weight 111.1 kg, SpO2 94%.  PHYSICAL EXAMINATION:   GENERAL:  78 y.o.-year-old patient with no acute distress. Obese LUNGS: decreased breath sounds bilaterally, no wheezing CARDIOVASCULAR: S1, S2 normal. No murmur   ABDOMEN: Soft, nontender, nondistended. Bowel sounds present.  EXTREMITIES: No  edema b/l.    NEUROLOGIC: nonfocal  patient is alert and awake   LABORATORY PANEL:  CBC Recent Labs  Lab 07/22/23 0934  WBC 10.8*  HGB 11.6*  HCT 38.1*  PLT 167    Chemistries  Recent Labs  Lab 07/22/23 0934  NA 137  K 3.8  CL 104  CO2 23  GLUCOSE 116*  BUN 28*  CREATININE 1.24  CALCIUM 8.6*  AST 20  ALT 21  ALKPHOS 35*  BILITOT 0.5   Cardiac Enzymes No results for input(s): "TROPONINI" in the last 168 hours. RADIOLOGY:  DG Chest 2 View Result Date: 07/22/2023 CLINICAL DATA:  Shortness of breath. Cough. Prior left lung carcinoma. EXAM: CHEST - 2 VIEW COMPARISON:  04/09/2019 FINDINGS: Heart size within normal limits. Stable posttreatment changes seen in the left hemithorax. New airspace opacity is seen in the left lower lung suspicious for pneumonia. Right lung remains clear. IMPRESSION: New airspace opacity in left lower lung, suspicious for pneumonia. Electronically Signed   By: Danae Orleans M.D.   On: 07/22/2023 09:35    Assessment and Plan  Marc Gray is a 78 y.o. 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   Chest x-ray showed left lower lobe infiltrates indicating for pneumonia.   Sepsis -Without acute endorgan damage.  Sepsis evidenced by tachycardia, hypotensive and responded to initial IV bolus in the ED.  Source of infection is left lower lobe CAP, bacterial --Continue antibiotics coverage ceftriaxone and azithromycin.   -- Lactic acidosis 2.5--- 2.6-- 4.7 -- patient clinically appears stable -- blood culture negative  LLL CAP, bacterial -Antibiotics as above -Incentive spirometry and flutter valve -Breathing treatment   Acute COPD exacerbation -As above -Short course of steroid p.o.   Chronic adrenal insufficiency -Will use higher dose steroid for COPD treatment for 5 days then switch back to home dose of prednisone   CAD -No acute concern   History of stage Gray lung cancer -Status post XRT, chemo and immunotherapy in 2020 -Considered to be cured after 5 years without relapse.   HTN -Hold off Lasix -Continue low-dose metoprolol  Procedures: Family communication : none Consults : none CODE STATUS: full DVT Prophylaxis : Lovenox Level of care: Telemetry Medical Status is: Inpatient Remains inpatient appropriate because: sepsis due to pneumonia    TOTAL TIME TAKING CARE OF THIS PATIENT: 50 minutes.  >  50% time spent on counselling and coordination of care  Note: This dictation was prepared with Dragon dictation along with smaller phrase technology. Any transcriptional errors that result from this process are unintentional.  Enedina Finner M.D    [facility]   CC: Primary care physician; [name], MD

## 2023-07-23 NOTE — Plan of Care (Signed)

## 2023-07-23 NOTE — Telephone Encounter (Signed)
 Pt. Called and said that he is in the hospital and he will be there in Tuesday also and he spoke to Dr. Allena Katz and she said that if the Cathie Hoops sends the labs that the pt needs she can put it in.   I sent the message to the team to see if that is doable and the pt. Wants call back   Before knowing about Allena Katz he was told that we can't put labs on inpatient unless it is necessary fore the reason you are in for the hospital

## 2023-07-23 NOTE — Progress Notes (Signed)
Per Dr Posey Pronto, d.c tele monitoring

## 2023-07-23 NOTE — Plan of Care (Signed)

## 2023-07-24 ENCOUNTER — Inpatient Hospital Stay: Payer: HMO

## 2023-07-24 ENCOUNTER — Telehealth: Payer: Self-pay | Admitting: *Deleted

## 2023-07-24 ENCOUNTER — Encounter: Payer: Self-pay | Admitting: Oncology

## 2023-07-24 DIAGNOSIS — I10 Essential (primary) hypertension: Secondary | ICD-10-CM | POA: Diagnosis not present

## 2023-07-24 DIAGNOSIS — A419 Sepsis, unspecified organism: Secondary | ICD-10-CM | POA: Diagnosis not present

## 2023-07-24 DIAGNOSIS — J189 Pneumonia, unspecified organism: Secondary | ICD-10-CM | POA: Diagnosis not present

## 2023-07-24 LAB — RETIC PANEL
Immature Retic Fract: 23.9 % — ABNORMAL HIGH (ref 2.3–15.9)
RBC.: 3.66 MIL/uL — ABNORMAL LOW (ref 4.22–5.81)
Retic Count, Absolute: 71.7 10*3/uL (ref 19.0–186.0)
Retic Ct Pct: 2 % (ref 0.4–3.1)
Reticulocyte Hemoglobin: 24 pg — ABNORMAL LOW (ref >27.9)

## 2023-07-24 LAB — IRON AND TIBC
Iron: 20 ug/dL — ABNORMAL LOW (ref 45–182)
Saturation Ratios: 6 % — ABNORMAL LOW (ref 17.9–39.5)
TIBC: 333 ug/dL (ref 250–450)
UIBC: 313 ug/dL

## 2023-07-24 LAB — LACTIC ACID, PLASMA: Lactic Acid, Venous: 1.5 mmol/L (ref 0.5–1.9)

## 2023-07-24 LAB — FERRITIN: Ferritin: 39 ng/mL (ref 24–336)

## 2023-07-24 MED ORDER — BENZONATATE 200 MG PO CAPS: 200.0000 mg | ORAL_CAPSULE | Freq: Three times a day (TID) | ORAL | 0 refills | Status: DC | PRN

## 2023-07-24 MED ORDER — CEFUROXIME AXETIL 500 MG PO TABS
500.0000 mg | ORAL_TABLET | Freq: Two times a day (BID) | ORAL | 0 refills | Status: AC
Start: 2023-07-25 — End: 2023-07-30

## 2023-07-24 MED ORDER — AZITHROMYCIN 500 MG PO TABS: 500.0000 mg | ORAL_TABLET | Freq: Every day | ORAL | 0 refills | Status: AC

## 2023-07-24 MED ORDER — GUAIFENESIN ER 600 MG PO TB12: 600.0000 mg | ORAL_TABLET | Freq: Two times a day (BID) | ORAL | 0 refills | Status: AC

## 2023-07-24 MED ORDER — CEFUROXIME AXETIL 500 MG PO TABS: 500.0000 mg | ORAL_TABLET | Freq: Two times a day (BID) | ORAL | Status: DC

## 2023-07-24 NOTE — Telephone Encounter (Signed)
 Yesterday the pt called over from his hospital bed and he said that the he spoke to Dr. Allena Katz and if Dr Cathie Hoops could put the labs she needs then they did not need the appt and he could not come here while he was in the hospital. We cancelled it .

## 2023-07-24 NOTE — Discharge Summary (Signed)
 Physician Discharge Summary   Patient: Marc Gray MRN: [id number] DOB: 07-08-1945  Admit date:     07/22/2023  Discharge date: 07/24/23  Discharge Physician: Enedina Finner   PCP: [name], MD   Recommendations at discharge:   follow-up Dr. Cathie Hoops at the cancer center on your upcoming follow-up your PCP in 1 to 2 weeks  Discharge Diagnoses: Principal Problem:   Pneumonia Active Problems:   LLL pneumonia   Sepsis (HCC)   Pulmonary emphysema (HCC)  Marc Gray is a 78 y.o. 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    Chest x-ray showed left lower lobe infiltrates indicating for pneumonia.    Sepsis -Without acute endorgan damage.  Sepsis evidenced by tachycardia, hypotensive and responded to initial IV bolus in the ED.  Source of infection is left lower lobe CAP, bacterial --Continue antibiotics coverage ceftriaxone and azithromycin.   -- Lactic acidosis 2.5--- 2.6-- 4.7--1.5 -- patient clinically appears stable -- blood culture negative -- sepsis improved   LLL CAP, bacterial -Antibiotics as above-- change to PO antibiotic at discharge -Incentive spirometry and flutter valve -Breathing treatment   Acute COPD exacerbation -As above -received Short course of steroid p.o. -- sats 97% on room air   Chronic adrenal insufficiency -resume home dose steroid   CAD -- stable   History of stage Gray lung cancer iron deficiency anemia -Status post XRT, chemo and immunotherapy in 2020 -Considered to be cured after 5 years without relapse. -- Patient follows at the cancer center with  Dr. Cathie Hoops  HTN -Continue low-dose metoprolol, Lasix   Procedures: Family communication : none Consults : none CODE STATUS: full DVT Prophylaxis : Lovenox     Pain control - [place] Controlled Substance Reporting System database was reviewed. and patient was instructed, not to drive, operate heavy machinery, perform activities at heights, swimming or participation in water activities or provide baby-sitting services while on Pain, Sleep and Anxiety Medications; until their outpatient Physician has advised to do so again. Also recommended to not to take more than prescribed Pain, Sleep and Anxiety Medications.  Disposition: Home Diet recommendation:  Discharge Diet Orders (From admission, onward)     Start     Ordered   07/24/23 0000  Diet - low sodium heart healthy        07/24/23 0835           Cardiac diet DISCHARGE MEDICATION: Allergies as of 07/24/2023       Reactions   Morphine And Codeine         Medication List     STOP taking these medications    mupirocin ointment 2 % Commonly known as: BACTROBAN   prochlorperazine 10 MG tablet Commonly known as: COMPAZINE       TAKE these medications    acetaminophen 650 MG CR tablet Commonly known as: TYLENOL Take 1,300 mg by mouth every 8 (eight) hours as needed for pain.   amphetamine-dextroamphetamine 20 MG tablet Commonly known as: ADDERALL Take by mouth.   aspirin EC 81 MG tablet Take 1 tablet (81 mg total) by mouth daily. Swallow whole.   Azelastine HCl 137 MCG/SPRAY Soln SMARTSIG:1-2 Spray(s) Both  Nares Twice Daily   azithromycin 500 MG tablet Commonly known as: ZITHROMAX Take 1 tablet (500 mg total) by mouth daily for 3 days. Start taking on: July 25, 2023   benzonatate 200 MG capsule Commonly known as: TESSALON Take 1 capsule (200 mg total) by mouth 3 (three) times daily as needed for cough.   Breztri Aerosphere 160-9-4.8 MCG/ACT Aero Generic drug:  Budeson-Glycopyrrol-Formoterol Inhale 2 puffs into the lungs 2 (two) times daily.   calcium-vitamin D 500-200 MG-UNIT tablet Commonly known as: OSCAL WITH D Take 1 tablet by mouth daily.   cefUROXime 500 MG tablet Commonly known as: CEFTIN Take 1 tablet (500 mg total) by mouth 2 (two) times daily with a meal for 5 days. Start taking on: July 25, 2023   Dupixent 300 MG/2ML Soaj Generic drug: Dupilumab Inject 300 mg into the skin every 14 (fourteen) days.   EPINEPHrine 0.3 mg/0.3 mL Soaj injection Commonly known as: EPI-PEN Inject 0.3 mg into the muscle as needed.   fluticasone 50 MCG/ACT nasal spray Commonly known as: FLONASE Place 1 spray into both nostrils daily.   furosemide 20 MG tablet Commonly known as: LASIX Take 20 mg by mouth daily.   gabapentin 100 MG capsule Commonly known as: Neurontin Take 1 capsule (100 mg total) by mouth 3 (three) times daily.   guaiFENesin 600 MG 12 hr tablet Commonly known as: MUCINEX Take 1 tablet (600 mg total) by mouth 2 (two) times daily for 5 days.   Ipratropium-Albuterol 20-100 MCG/ACT Aers respimat Commonly known as: COMBIVENT Inhale 1 puff into the lungs every 6 (six) hours as needed.   levothyroxine 137 MCG tablet Commonly known as: SYNTHROID Take 137 mcg by mouth daily before breakfast.   magnesium oxide 400 (240 Mg) MG tablet Commonly known as: MAG-OX Take 400 mg by mouth daily.   Melatonin 10 MG Tabs Take 10 mg by mouth at bedtime.   metoprolol tartrate 25 MG tablet Commonly known as: LOPRESSOR Take 0.5 tablets (12.5 mg total) by mouth 2 (two) times daily.   montelukast 10 MG tablet Commonly known as: SINGULAIR Take 10 mg by mouth daily.   Multi-Vitamins Tabs Take 1 tablet by mouth daily.   pantoprazole 40 MG tablet Commonly known as: Protonix Take 1 tablet (40 mg total) by mouth daily.   prasugrel 10 MG Tabs tablet Commonly known as: EFFIENT Take 1 tablet (10 mg total) by mouth daily.   predniSONE  5 MG tablet Commonly known as: DELTASONE TAKE TWO TABLETS BY MOUTH IN THE MORNING AND TAKE ONE-HALF Tablets  BY MOUTH IN THE AFTERNOON   rosuvastatin 10 MG tablet Commonly known as: CRESTOR Take 10 mg by mouth daily.   tadalafil 10 MG tablet Commonly known as: CIALIS Take 1-2 tablets (10-20 mg total) by mouth daily as needed for erectile dysfunction. Take 5-10mg  by mouth as needed prior to intercourse   tamsulosin 0.4 MG Caps capsule Commonly known as: FLOMAX Take 1 capsule (0.4 mg total) by mouth daily.   traMADol 50 MG tablet Commonly known as: ULTRAM Take 50 mg by mouth every 6 (six) hours as needed for moderate pain (pain score 4-6). If he knows that he is going to be on his feet he will take a tablet to combat the pain   traZODone 100 MG tablet Commonly known as: DESYREL Take 100 mg by mouth at bedtime.        Follow-up Information     Rickard Patience, MD. Go to.   Specialty: Oncology Why:  On your upcoming appointment Contact information: [address] [place] [address] [phone]         [name], MD. Schedule an appointment as soon as possible for a visit.   Specialty: Family Medicine Contact information: [address] [place] [address] [phone]                Discharge Exam: Ceasar Mons Weights   07/22/23 0910  Weight: 111.1 kg   GENERAL:  78 y.o.-year-old patient with no acute distress. Obese LUNGS: decreased breath sounds bilaterally, no wheezing CARDIOVASCULAR: S1, S2 normal. No murmur   ABDOMEN: Soft, nontender, nondistended. Bowel sounds present.  EXTREMITIES: No  edema b/l.    NEUROLOGIC: nonfocal  patient is alert and awake  Condition at discharge: fair  The results of significant diagnostics from this hospitalization (including imaging, microbiology, ancillary and laboratory) are listed below for reference.   Imaging Studies: DG Chest 2 View Result Date: 07/22/2023 CLINICAL DATA:  Shortness of  breath. Cough. Prior left lung carcinoma. EXAM: CHEST - 2 VIEW COMPARISON:  04/09/2019 FINDINGS: Heart size within normal limits. Stable posttreatment changes seen in the left hemithorax. New airspace opacity is seen in the left lower lung suspicious for pneumonia. Right lung remains clear. IMPRESSION: New airspace opacity in left lower lung, suspicious for pneumonia. Electronically Signed   By: Danae Orleans M.D.   On: 07/22/2023 09:35   VAS [place] ABI WITH/WO TBI Result Date: 07/02/2023  LOWER EXTREMITY DOPPLER STUDY Patient Name:  KOFI MURRELL Gray  Date of Exam:   07/02/2023 Medical Rec #: [id number]                     Accession #:    [id number] Date of Birth: 1945-08-13                      Patient Gender: M Patient Age:   47 years Exam Location:  [facility] Procedure:      VAS [place] ABI WITH/WO TBI Referring Phys: GREGORY SCHNIER --------------------------------------------------------------------------------  High Risk Factors: Past history of smoking, prior MI.  Performing Technologist: Hardie Lora RVT  Examination Guidelines: A complete evaluation includes at minimum, Doppler waveform signals and systolic blood pressure reading at the level of bilateral brachial, anterior tibial, and posterior tibial arteries, when vessel segments are accessible. Bilateral testing is considered an integral part of a complete examination. Photoelectric Plethysmograph (PPG) waveforms and toe systolic pressure readings are included as required and additional duplex testing as needed. Limited examinations for reoccurring indications may be performed as noted.  ABI Findings: +---------+------------------+-----+---------+--------+ Right    Rt Pressure (mmHg)IndexWaveform Comment  +---------+------------------+-----+---------+--------+ Brachial 142                                      +---------+------------------+-----+---------+--------+ PTA      146               1.03 triphasic          +---------+------------------+-----+---------+--------+ DP       159               1.12 triphasic         +---------+------------------+-----+---------+--------+ Great Toe115               0.81                   +---------+------------------+-----+---------+--------+ +---------+------------------+-----+---------+-------+  Left     Lt Pressure (mmHg)IndexWaveform Comment +---------+------------------+-----+---------+-------+ Brachial 136                                     +---------+------------------+-----+---------+-------+ PTA      142               1.00 triphasic        +---------+------------------+-----+---------+-------+ DP       141               0.99 triphasic        +---------+------------------+-----+---------+-------+ Great Toe124               0.87                  +---------+------------------+-----+---------+-------+ +-------+-----------+-----------+------------+------------+ ABI/TBIToday's ABIToday's TBIPrevious ABIPrevious TBI +-------+-----------+-----------+------------+------------+ Right  1.12       0.81                                +-------+-----------+-----------+------------+------------+ Left   1.00       0.87                                +-------+-----------+-----------+------------+------------+  Summary: Right: Resting right ankle-brachial index is within normal range. The right toe-brachial index is normal. Left: Resting left ankle-brachial index is within normal range. The left toe-brachial index is normal. *See table(s) above for measurements and observations.  Electronically signed by Levora Dredge MD on 07/02/2023 at 2:32:59 PM.    Final     Microbiology: Results for orders placed or performed during the hospital encounter of 07/22/23  Blood culture (routine x 2)     Status: None (Preliminary result)   Collection Time: 07/22/23  9:34 AM   Specimen: BLOOD LEFT HAND  Result Value Ref Range Status   Specimen  Description BLOOD LEFT HAND  Final   Special Requests   Final    BOTTLES DRAWN AEROBIC AND ANAEROBIC Blood Culture adequate volume   Culture   Final    NO GROWTH 2 DAYS Performed at [facility], [address], [place], [place] [address]    Report Status PENDING  Incomplete  Blood culture (routine x 2)     Status: None (Preliminary result)   Collection Time: 07/22/23  9:34 AM   Specimen: BLOOD  Result Value Ref Range Status   Specimen Description BLOOD RIGHT ANTECUBITAL  Final   Special Requests   Final    BOTTLES DRAWN AEROBIC AND ANAEROBIC Blood Culture results may not be optimal due to an inadequate volume of blood received in culture bottles   Culture   Final    NO GROWTH 2 DAYS Performed at [facility], [address], [place], [place] [address]    Report Status PENDING  Incomplete  Resp panel by RT-PCR (RSV, Flu A&B, Covid) Anterior Nasal Swab     Status: None   Collection Time: 07/22/23  9:52 AM   Specimen: Anterior Nasal Swab  Result Value Ref Range Status   SARS Coronavirus 2 by RT PCR NEGATIVE NEGATIVE Final    Comment: (NOTE) SARS-CoV-2 target nucleic acids are NOT DETECTED.  The SARS-CoV-2 RNA is generally detectable in upper respiratory specimens during the acute phase of infection. The lowest concentration of SARS-CoV-2 viral copies this  assay can detect is 138 copies/mL. A negative result does not preclude SARS-Cov-2 infection and should not be used as the sole basis for treatment or other patient management decisions. A negative result may occur with  improper specimen collection/handling, submission of specimen other than nasopharyngeal swab, presence of viral mutation(s) within the areas targeted by this assay, and inadequate number of viral copies(<138 copies/mL). A negative result must be combined with clinical observations, patient history, and epidemiological information. The expected result is Negative.  Fact Sheet for Patients:   BloggerCourse.com  Fact Sheet for Healthcare Providers:  SeriousBroker.it  This test is no t yet approved or cleared by the [place] FDA and  has been authorized for detection and/or diagnosis of SARS-CoV-2 by FDA under an Emergency Use Authorization (EUA). This EUA will remain  in effect (meaning this test can be used) for the duration of the COVID-19 declaration under Section 564(b)(1) of the Act, 21 U.S.C.section 360bbb-3(b)(1), unless the authorization is terminated  or revoked sooner.       Influenza A by PCR NEGATIVE NEGATIVE Final   Influenza B by PCR NEGATIVE NEGATIVE Final    Comment: (NOTE) The Xpert Xpress SARS-CoV-2/FLU/RSV plus assay is intended as an aid in the diagnosis of influenza from Nasopharyngeal swab specimens and should not be used as a sole basis for treatment. Nasal washings and aspirates are unacceptable for Xpert Xpress SARS-CoV-2/FLU/RSV testing.  Fact Sheet for Patients: BloggerCourse.com  Fact Sheet for Healthcare Providers: SeriousBroker.it  This test is not yet approved or cleared by the [place] FDA and has been authorized for detection and/or diagnosis of SARS-CoV-2 by FDA under an Emergency Use Authorization (EUA). This EUA will remain in effect (meaning this test can be used) for the duration of the COVID-19 declaration under Section 564(b)(1) of the Act, 21 U.S.C. section 360bbb-3(b)(1), unless the authorization is terminated or revoked.     Resp Syncytial Virus by PCR NEGATIVE NEGATIVE Final    Comment: (NOTE) Fact Sheet for Patients: BloggerCourse.com  Fact Sheet for Healthcare Providers: SeriousBroker.it  This test is not yet approved or cleared by the [place] FDA and has been authorized for detection and/or diagnosis of SARS-CoV-2 by FDA under an Emergency Use  Authorization (EUA). This EUA will remain in effect (meaning this test can be used) for the duration of the COVID-19 declaration under Section 564(b)(1) of the Act, 21 U.S.C. section 360bbb-3(b)(1), unless the authorization is terminated or revoked.  Performed at [facility], [address] [place], [place] [address]   Expectorated Sputum Assessment w Gram Stain, Rflx to Resp Cult     Status: None   Collection Time: 07/23/23  8:55 AM   Specimen: Sputum  Result Value Ref Range Status   Specimen Description SPUTUM  Final   Special Requests NONE  Final   Sputum evaluation   Final    THIS SPECIMEN IS ACCEPTABLE FOR SPUTUM CULTURE Performed at [facility], [address] [place], [place] [address]    Report Status 07/23/2023 FINAL  Final  Culture, Respiratory w Gram Stain     Status: None (Preliminary result)   Collection Time: 07/23/23  8:55 AM   Specimen: SPU  Result Value Ref Range Status   Specimen Description   Final    SPUTUM Performed at [facility], [address], [place], [place] [address]    Special Requests   Final    NONE Reflexed from [id number] Performed at [facility], [address]  [place], [place] [address]    Gram Stain   Final    NO WBC SEEN FEW BUDDING YEAST SEEN FEW GRAM POSITIVE RODS Performed at [facility], [address] [place], [place] [address]    Culture PENDING  Incomplete   Report Status PENDING  Incomplete    Labs: CBC: Recent Labs  Lab 07/22/23 0934  WBC 10.8*  HGB 11.6*  HCT 38.1*  MCV 89.6  PLT 167   Basic Metabolic Panel: Recent Labs  Lab 07/22/23 0934  NA 137  K 3.8  CL 104  CO2 23  GLUCOSE 116*  BUN 28*  CREATININE 1.24  CALCIUM 8.6*   Liver Function Tests: Recent Labs  Lab 07/22/23 0934  AST 20  ALT 21  ALKPHOS 35*  BILITOT 0.5  PROT 6.3*  ALBUMIN 3.4*   Discharge time spent: greater than 30 minutes.  Signed: Enedina Finner, MD [facility] 07/24/2023

## 2023-07-24 NOTE — Telephone Encounter (Addendum)
 Dr. Cathie Hoops communicated with Dr. Allena Katz regarding labs

## 2023-07-24 NOTE — Progress Notes (Signed)
 Patient being discharged home. PIV removed. Discharge instructions were went over with patient. Patient stated that he understood and all questions were answered. Patient will be going home POV with wife.

## 2023-07-25 DIAGNOSIS — Z125 Encounter for screening for malignant neoplasm of prostate: Secondary | ICD-10-CM | POA: Diagnosis not present

## 2023-07-25 DIAGNOSIS — I214 Non-ST elevation (NSTEMI) myocardial infarction: Secondary | ICD-10-CM | POA: Diagnosis not present

## 2023-07-25 DIAGNOSIS — N1831 Chronic kidney disease, stage 3a: Secondary | ICD-10-CM | POA: Diagnosis not present

## 2023-07-26 DIAGNOSIS — I214 Non-ST elevation (NSTEMI) myocardial infarction: Secondary | ICD-10-CM | POA: Diagnosis not present

## 2023-07-26 DIAGNOSIS — R0602 Shortness of breath: Secondary | ICD-10-CM | POA: Diagnosis not present

## 2023-07-26 DIAGNOSIS — C3492 Malignant neoplasm of unspecified part of left bronchus or lung: Secondary | ICD-10-CM | POA: Diagnosis not present

## 2023-07-26 LAB — CULTURE, RESPIRATORY W GRAM STAIN: Gram Stain: NONE SEEN

## 2023-07-27 LAB — CULTURE, BLOOD (ROUTINE X 2)
Culture: NO GROWTH
Culture: NO GROWTH
Special Requests: ADEQUATE

## 2023-07-31 ENCOUNTER — Inpatient Hospital Stay: Payer: HMO

## 2023-07-31 ENCOUNTER — Encounter: Payer: Self-pay | Admitting: Oncology

## 2023-07-31 ENCOUNTER — Inpatient Hospital Stay: Payer: HMO | Attending: Oncology | Admitting: Oncology

## 2023-07-31 VITALS — BP 154/80 | HR 57 | Temp 97.0°F | Resp 19

## 2023-07-31 DIAGNOSIS — Z87891 Personal history of nicotine dependence: Secondary | ICD-10-CM | POA: Diagnosis not present

## 2023-07-31 DIAGNOSIS — Z7952 Long term (current) use of systemic steroids: Secondary | ICD-10-CM | POA: Diagnosis not present

## 2023-07-31 DIAGNOSIS — C3492 Malignant neoplasm of unspecified part of left bronchus or lung: Secondary | ICD-10-CM

## 2023-07-31 DIAGNOSIS — D472 Monoclonal gammopathy: Secondary | ICD-10-CM

## 2023-07-31 DIAGNOSIS — Z8546 Personal history of malignant neoplasm of prostate: Secondary | ICD-10-CM

## 2023-07-31 DIAGNOSIS — D509 Iron deficiency anemia, unspecified: Secondary | ICD-10-CM

## 2023-07-31 DIAGNOSIS — J4540 Moderate persistent asthma, uncomplicated: Secondary | ICD-10-CM | POA: Diagnosis not present

## 2023-07-31 DIAGNOSIS — E860 Dehydration: Secondary | ICD-10-CM

## 2023-07-31 DIAGNOSIS — E032 Hypothyroidism due to medicaments and other exogenous substances: Secondary | ICD-10-CM | POA: Diagnosis not present

## 2023-07-31 DIAGNOSIS — J8283 Eosinophilic asthma: Secondary | ICD-10-CM | POA: Diagnosis not present

## 2023-07-31 DIAGNOSIS — E2740 Unspecified adrenocortical insufficiency: Secondary | ICD-10-CM | POA: Diagnosis not present

## 2023-07-31 MED ORDER — IRON SUCROSE 20 MG/ML IV SOLN
200.0000 mg | Freq: Once | INTRAVENOUS | Status: AC
  Administered 2023-07-31: 200 mg via INTRAVENOUS

## 2023-07-31 MED ORDER — SODIUM CHLORIDE 0.9% FLUSH
10.0000 mL | Freq: Once | INTRAVENOUS | Status: AC | PRN
  Administered 2023-07-31: 10 mL
  Filled 2023-07-31: qty 10

## 2023-07-31 NOTE — Assessment & Plan Note (Addendum)
 Lab Results  Component Value Date   HGB 11.6 (L) 07/22/2023   TIBC 333 07/24/2023   IRONPCTSAT 6 (L) 07/24/2023   FERRITIN 39 07/24/2023    Recommend IV venofer weekly x 2 Etiology of IDA is unknown. Likely GI blood loss due to dual antiplatelet therapy. He has had GI evaluation.

## 2023-07-31 NOTE — Progress Notes (Signed)
 Patient would like to know more about his iron

## 2023-07-31 NOTE — Progress Notes (Signed)
 Hematology/Oncology Progress note [phone] [phone]) [phone]     Reason for Visit:  Follow up for lung cancer   ASSESSMENT & PLAN:   Cancer Staging  Adenocarcinoma of left lung, stage 3 (HCC) Staging form: Lung, AJCC 8th Edition - Clinical stage from 02/14/2017: Stage IIIB (cT2b, cN3, cM0) - Signed by Rickard Patience, MD on 02/14/2017   Adenocarcinoma of left lung, stage 3 (HCC) #Stage IIIB lung adenocarcinoma, s/p concurrent chemo/RT and immunotherapy, finished all treatment in January 2020 Oct 2025 CT chest abdomen pelvis without contrast reviewed. No recurrence.  He is more than 5 year since he finishes treatment  Given his history of previous smoking history, recommend to continue CT annually.   Adrenal insufficiency (HCC) Continue prednisone Follow up with endocrinology   History of prostate cancer # TURP procedure done on 02/14/2019.  Pathology showed prostate adenocarcinoma, Gleason score 3+3, tumor quantitation, estimated percentage of prostate tissue involved by tumor less than 5%.No perineural invasion identified. Recommend patient to follows up with urologist  PSA 3.82  Hypothyroidism due to medication Continue Synthroid. Follow up with endocrinologist.   IDA (iron deficiency anemia) Lab Results  Component Value Date   HGB 11.6 (L) 07/22/2023   TIBC 333 07/24/2023   IRONPCTSAT 6 (L) 07/24/2023   FERRITIN 39 07/24/2023    Recommend IV venofer weekly x 2 Etiology of IDA is unknown. Likely GI blood loss due to dual antiplatelet therapy. He has had GI evaluation.   MGUS (monoclonal gammopathy of unknown significance) Observation.    Orders Placed This Encounter  Procedures   CBC with Differential (Cancer Center Only)    Standing Status:   Future    Expected Date:   11/30/2023    Expiration Date:   07/30/2024   Iron and TIBC    Standing Status:   Future    Expected Date:   11/30/2023    Expiration Date:   07/30/2024   Ferritin    Standing Status:    Future    Expected Date:   11/30/2023    Expiration Date:   07/30/2024   Follow up in 4 months.   All questions were answered. The patient knows to call the clinic with any problems, questions or concerns.  Rickard Patience, MD, PhD [facility] Hematology Oncology 07/31/2023    HISTORY OF PRESENTING ILLNESS:  Marc Gray 78 y.o.  male who presents for treatment of Stage IIIB (cT2b, cN3, cM0)  Adenocarcinoma of lung.  # Patient is s/p [place] and taxol concurrent RT for treatment of Stage IIIB Lung cancer. He recieved additional lung boost RT finished one year of Durvalumab -January 2020  # Colon CA was listed in his history, however reviewing his surgical pathology from colonoscopy on 04/15/2014, during which he had polyps removed and all are negative for cancer.patient is scheduled for colonoscopy screening this year.  # 5/1/ 2019 CT scan was discussed at tumor board and changes consistent with radiation changes  # TURP procedure done on 02/14/2019.  Pathology showed prostate adenocarcinoma, Gleason score 3+3, tumor quantitation, estimated percentage of prostate tissue involved by tumor less than 5%. No perineural invasion identified. Patient follows up with urologist and was recommended to observe.  03/07/2021, CT without contrast showed unchanged posttreatment/postradiation fibrosis and consolidation of the perihilar left lung and a paramedian left upper lobe.  Unchanged 4 mm nodules of the right lower lobe.  Almost certainly benign and incidental.  Emphysema.  Coronary artery disease.  Medi port was removed.  August 2024, hospitalization due to NSTEMI.  Patient is currently on aspirin and Effient  INTERVAL HISTORY Patient he presents for follow-up of fo stage IIIB adenocarcinoma of the lung. Patient follows up with endocrinology for adrenal insufficiency and hypothyroidism.   Patient  takes prednisone 10 mg in a.m., 2.5 mg in p.m.  Feb 2025 Hospitalization due to pneumonia  and sepsis.   Today he reports feeling at his baseline. No fever chills, SOB or cough.   Review of Systems  Constitutional:  Negative for appetite change, chills, fatigue, fever and unexpected weight change.  HENT:   Negative for hearing loss and voice change.   Eyes:  Negative for eye problems and icterus.  Respiratory:  Negative for chest tightness, cough and shortness of breath.   Cardiovascular:  Negative for chest pain and leg swelling.  Gastrointestinal:  Negative for abdominal distention, abdominal pain and blood in stool.  Endocrine: Negative for hot flashes.  Genitourinary:  Negative for difficulty urinating, dysuria and frequency.   Musculoskeletal:  Positive for arthralgias and back pain.  Skin:  Negative for itching and rash.  Neurological:  Negative for extremity weakness, light-headedness and numbness.  Hematological:  Negative for adenopathy. Does not bruise/bleed easily.  Psychiatric/Behavioral:  Negative for confusion.     MEDICAL HISTORY:  Past Medical History:  Diagnosis Date   Anxiety    Aortic atherosclerosis (HCC)    Arthritis    SHOULDER   BPH (benign prostatic hyperplasia)    Cataract    Chronic kidney disease    low kidney function   Colon adenomas    Colon cancer (HCC) 2015   Pt states during his colonoscopy he had cancerous polyps removed.    COPD (chronic obstructive pulmonary disease) (HCC)    Depression    SINCE DIAGNOSIS   Dyspnea    DOE   Enlarged prostate    GERD (gastroesophageal reflux disease)    Hypertension    Hypothyroidism    Lung cancer (HCC)    Aug 2018   Pain    DUE TO LUNG ISSUE    SURGICAL HISTORY: Past Surgical History:  Procedure Laterality Date   BACK SURGERY     kyphoplasty  T5-6   CATARACT EXTRACTION W/ INTRAOCULAR LENS  IMPLANT, BILATERAL     COLONOSCOPY     COLONOSCOPY WITH PROPOFOL N/A 12/17/2019   Procedure: COLONOSCOPY WITH PROPOFOL;  Surgeon: Earline Mayotte, MD;  Location: [facility] ENDOSCOPY;  Service:  Endoscopy;  Laterality: N/A;   CORONARY STENT INTERVENTION N/A 12/29/2022   Procedure: CORONARY STENT INTERVENTION;  Surgeon: Marcina Millard, MD;  Location: [facility] INVASIVE CV LAB;  Service: Cardiovascular;  Laterality: N/A;   EYE SURGERY Bilateral    cataract   LEFT HEART CATH AND CORONARY ANGIOGRAPHY N/A 12/29/2022   Procedure: LEFT HEART CATH AND CORONARY ANGIOGRAPHY;  Surgeon: Marcina Millard, MD;  Location: [facility] INVASIVE CV LAB;  Service: Cardiovascular;  Laterality: N/A;   LYMPH GLAND EXCISION N/A 02/09/2017   Procedure: CERVICAL LYMPH NODE BIOPSY;  Surgeon: Earline Mayotte, MD;  Location: [facility] ORS;  Service: General;  Laterality: N/A;   PORTACATH PLACEMENT Right 02/23/2017   Procedure: INSERTION PORT-A-CATH;  Surgeon: Earline Mayotte, MD;  Location: [facility] ORS;  Service: General;  Laterality: Right;   PROSTATE SURGERY     TONSILLECTOMY     TRANSURETHRAL RESECTION OF PROSTATE N/A 02/14/2019   Procedure: TRANSURETHRAL RESECTION OF THE PROSTATE (TURP);  Surgeon: Sondra Come, MD;  Location: [facility] ORS;  Service: Urology;  Laterality:  N/A;    SOCIAL HISTORY: Social History   Socioeconomic History   Marital status: Married    Spouse name: Not on file   Number of children: Not on file   Years of education: Not on file   Highest education level: Not on file  Occupational History   Not on file  Tobacco Use   Smoking status: Former    Current packs/day: 0.00    Average packs/day: 1 pack/day for 53.0 years (53.0 ttl pk-yrs)    Types: Cigarettes    Start date: 04/14/1964    Quit date: 04/14/2017    Years since quitting: 6.2    Passive exposure: Past   Smokeless tobacco: Never   Tobacco comments:    Quit November 2018  Vaping Use   Vaping status: Never Used  Substance and Sexual Activity   Alcohol use: Not Currently    Comment: occassional   Drug use: No   Sexual activity: Yes  Other Topics Concern   Not on file  Social History Narrative   Not on file   Social  Drivers of Health   Financial Resource Strain: Patient Declined (07/27/2022)   [facility]   Overall Financial Resource Strain (CARDIA)    Difficulty of Paying Living Expenses: Patient declined  Food Insecurity: No Food Insecurity (07/22/2023)   Hunger Vital Sign    Worried About Running Out of Food in the Last Year: Never true    Ran Out of Food in the Last Year: Never true  Transportation Needs: No Transportation Needs (07/22/2023)   PRAPARE - Administrator, Civil Service (Medical): No    Lack of Transportation (Non-Medical): No  Physical Activity: Not on file  Stress: Not on file  Social Connections: Moderately Integrated (07/22/2023)   Social Connection and Isolation Panel [NHANES]    Frequency of Communication with Friends and Family: Three times a week    Frequency of Social Gatherings with Friends and Family: Twice a week    Attends Religious Services: 1 to 4 times per year    Active Member of [employer] or Organizations: No    Attends Banker Meetings: Never    Marital Status: Married  Catering manager Violence: Not At Risk (07/22/2023)   Humiliation, Afraid, Rape, and Kick questionnaire    Fear of Current or Ex-Partner: No    Emotionally Abused: No    Physically Abused: No    Sexually Abused: No    FAMILY HISTORY: Family History  Problem Relation Age of Onset   Breast cancer Maternal Grandmother    Dementia Mother    Diabetes Father    Stroke Father     ALLERGIES:  is allergic to morphine and codeine.  MEDICATIONS:  Current Outpatient Medications  Medication Sig Dispense Refill   acetaminophen (TYLENOL) 650 MG CR tablet Take 1,300 mg by mouth every 8 (eight) hours as needed for pain.     amphetamine-dextroamphetamine (ADDERALL) 20 MG tablet Take by mouth.     aspirin EC 81 MG tablet Take 1 tablet (81 mg total) by mouth daily. Swallow whole. 90 tablet 0   Azelastine HCl 137 MCG/SPRAY  SOLN SMARTSIG:1-2 Spray(s) Both Nares Twice Daily     benzonatate (TESSALON) 200 MG capsule Take 1 capsule (200 mg total) by mouth 3 (three) times daily as needed for cough. 20 capsule 0   BREZTRI AEROSPHERE 160-9-4.8 MCG/ACT AERO Inhale 2 puffs into the lungs 2 (two) times daily.  calcium-vitamin D (OSCAL WITH D) 500-200 MG-UNIT tablet Take 1 tablet by mouth daily. 90 tablet 1   DUPIXENT 300 MG/2ML SOPN Inject 300 mg into the skin every 14 (fourteen) days.     EPINEPHrine 0.3 mg/0.3 mL IJ SOAJ injection Inject 0.3 mg into the muscle as needed.     fluticasone (FLONASE) 50 MCG/ACT nasal spray Place 1 spray into both nostrils daily. 16 g 2   furosemide (LASIX) 20 MG tablet Take 20 mg by mouth daily.     gabapentin (NEURONTIN) 100 MG capsule Take 1 capsule (100 mg total) by mouth 3 (three) times daily. 90 capsule 2   Ipratropium-Albuterol (COMBIVENT) 20-100 MCG/ACT AERS respimat Inhale 1 puff into the lungs every 6 (six) hours as needed.     levothyroxine (SYNTHROID) 137 MCG tablet Take 137 mcg by mouth daily before breakfast.     magnesium oxide (MAG-OX) 400 (240 Mg) MG tablet Take 400 mg by mouth daily.     Melatonin 10 MG TABS Take 10 mg by mouth at bedtime.      metoprolol tartrate (LOPRESSOR) 25 MG tablet Take 0.5 tablets (12.5 mg total) by mouth 2 (two) times daily. 30 tablet 2   montelukast (SINGULAIR) 10 MG tablet Take 10 mg by mouth daily.     Multiple Vitamin (MULTI-VITAMINS) TABS Take 1 tablet by mouth daily.      pantoprazole (PROTONIX) 40 MG tablet Take 1 tablet (40 mg total) by mouth daily. 90 tablet 0   prasugrel (EFFIENT) 10 MG TABS tablet Take 1 tablet (10 mg total) by mouth daily. 30 tablet 11   predniSONE (DELTASONE) 5 MG tablet TAKE TWO TABLETS BY MOUTH IN THE MORNING AND TAKE ONE-HALF Tablets  BY MOUTH IN THE AFTERNOON     rosuvastatin (CRESTOR) 10 MG tablet Take 10 mg by mouth daily.     tadalafil (CIALIS) 10 MG tablet Take 1-2 tablets (10-20 mg total) by mouth daily as  needed for erectile dysfunction. Take 5-10mg  by mouth as needed prior to intercourse 30 tablet 11   tamsulosin (FLOMAX) 0.4 MG CAPS capsule Take 1 capsule (0.4 mg total) by mouth daily. 90 capsule 3   traMADol (ULTRAM) 50 MG tablet Take 50 mg by mouth every 6 (six) hours as needed for moderate pain (pain score 4-6). If he knows that he is going to be on his feet he will take a tablet to combat the pain     traZODone (DESYREL) 100 MG tablet Take 100 mg by mouth at bedtime.     No current facility-administered medications for this visit.   Facility-Administered Medications Ordered in Other Visits  Medication Dose Route Frequency Provider Last Rate Last Admin   sodium chloride flush (NS) 0.9 % injection 10 mL  10 mL Intravenous PRN Rickard Patience, MD   10 mL at 04/03/17 4098      .  PHYSICAL EXAMINATION: ECOG PERFORMANCE STATUS: 1 - Symptomatic but completely ambulatory There were no vitals filed for this visit.  There were no vitals filed for this visit. Physical Exam Constitutional:      General: He is not in acute distress.    Appearance: He is obese. He is not diaphoretic.  HENT:     Head: Normocephalic and atraumatic.  Eyes:     General: No scleral icterus. Cardiovascular:     Rate and Rhythm: Normal rate and regular rhythm.  Pulmonary:     Effort: Pulmonary effort is normal. No respiratory distress.  Chest:  Chest wall: No tenderness.  Abdominal:     General: Bowel sounds are normal. There is no distension.     Palpations: Abdomen is soft.  Musculoskeletal:        General: Normal range of motion.     Cervical back: Normal range of motion and neck supple.  Skin:    General: Skin is warm and dry.  Neurological:     Mental Status: He is alert and oriented to person, place, and time. Mental status is at baseline.     Motor: No abnormal muscle tone.  Psychiatric:        Mood and Affect: Mood and affect normal.    LABORATORY DATA:  I have reviewed the data as listed     Latest Ref Rng & Units 07/22/2023    9:34 AM 04/09/2023    3:34 AM 03/28/2023    9:58 AM  CBC  WBC 4.0 - 10.5 K/uL 10.8  6.9  5.6   Hemoglobin 13.0 - 17.0 g/dL 04.5  9.2  9.3   Hematocrit 39.0 - 52.0 % 38.1  31.5  31.6   Platelets 150 - 400 K/uL 167  206  185       Latest Ref Rng & Units 07/22/2023    9:34 AM 04/09/2023    3:34 AM 03/28/2023    9:59 AM  CMP  Glucose 70 - 99 mg/dL 409  811  914   BUN 8 - 23 mg/dL 28  33  34   Creatinine 0.61 - 1.24 mg/dL 7.82  9.56  2.13   Sodium 135 - 145 mmol/L 137  137  139   Potassium 3.5 - 5.1 mmol/L 3.8  3.9  3.3   Chloride 98 - 111 mmol/L 104  106  108   CO2 22 - 32 mmol/L 23  23  24    Calcium 8.9 - 10.3 mg/dL 8.6  8.7  8.3   Total Protein 6.5 - 8.1 g/dL 6.3   6.1   Total Bilirubin 0.0 - 1.2 mg/dL 0.5   0.6   Alkaline Phos 38 - 126 U/L 35   34   AST 15 - 41 U/L 20   20   ALT 0 - 44 U/L 21   22     RADIOGRAPHIC STUDIES: I have personally reviewed the radiological images as listed and agreed with the findings in the report. DG Chest 2 View Result Date: 07/22/2023 CLINICAL DATA:  Shortness of breath. Cough. Prior left lung carcinoma. EXAM: CHEST - 2 VIEW COMPARISON:  04/09/2019 FINDINGS: Heart size within normal limits. Stable posttreatment changes seen in the left hemithorax. New airspace opacity is seen in the left lower lung suspicious for pneumonia. Right lung remains clear. IMPRESSION: New airspace opacity in left lower lung, suspicious for pneumonia. Electronically Signed   By: Danae Orleans M.D.   On: 07/22/2023 09:35   VAS [place] ABI WITH/WO TBI Result Date: 07/02/2023  LOWER EXTREMITY DOPPLER STUDY Patient Name:  Marc Gray  Date of Exam:   07/02/2023 Medical Rec #: [id number]                     Accession #:    [id number] Date of Birth: 01/21/1946                      Patient Gender: M Patient Age:   78 years Exam Location:  [facility] Procedure:      VAS [place] ABI  WITH/WO TBI Referring Phys: [facility]  --------------------------------------------------------------------------------  High Risk Factors: Past history of smoking, prior MI.  Performing Technologist: Hardie Lora RVT  Examination Guidelines: A complete evaluation includes at minimum, Doppler waveform signals and systolic blood pressure reading at the level of bilateral brachial, anterior tibial, and posterior tibial arteries, when vessel segments are accessible. Bilateral testing is considered an integral part of a complete examination. Photoelectric Plethysmograph (PPG) waveforms and toe systolic pressure readings are included as required and additional duplex testing as needed. Limited examinations for reoccurring indications may be performed as noted.  ABI Findings: +---------+------------------+-----+---------+--------+ Right    Rt Pressure (mmHg)IndexWaveform Comment  +---------+------------------+-----+---------+--------+ Brachial 142                                      +---------+------------------+-----+---------+--------+ PTA      146               1.03 triphasic         +---------+------------------+-----+---------+--------+ DP       159               1.12 triphasic         +---------+------------------+-----+---------+--------+ Great Toe115               0.81                   +---------+------------------+-----+---------+--------+ +---------+------------------+-----+---------+-------+ Left     Lt Pressure (mmHg)IndexWaveform Comment +---------+------------------+-----+---------+-------+ Brachial 136                                     +---------+------------------+-----+---------+-------+ PTA      142               1.00 triphasic        +---------+------------------+-----+---------+-------+ DP       141               0.99 triphasic        +---------+------------------+-----+---------+-------+ Great Toe124               0.87                   +---------+------------------+-----+---------+-------+ +-------+-----------+-----------+------------+------------+ ABI/TBIToday's ABIToday's TBIPrevious ABIPrevious TBI +-------+-----------+-----------+------------+------------+ Right  1.12       0.81                                +-------+-----------+-----------+------------+------------+ Left   1.00       0.87                                +-------+-----------+-----------+------------+------------+  Summary: Right: Resting right ankle-brachial index is within normal range. The right toe-brachial index is normal. Left: Resting left ankle-brachial index is within normal range. The left toe-brachial index is normal. *See table(s) above for measurements and observations.  Electronically signed by Levora Dredge MD on 07/02/2023 at 2:32:59 PM.    Final

## 2023-07-31 NOTE — Assessment & Plan Note (Signed)
Observation  

## 2023-07-31 NOTE — Assessment & Plan Note (Addendum)
#   TURP procedure done on 02/14/2019.  Pathology showed prostate adenocarcinoma, Gleason score 3+3, tumor quantitation, estimated percentage of prostate tissue involved by tumor less than 5%.No perineural invasion identified. Recommend patient to follows up with urologist  PSA 3.82

## 2023-07-31 NOTE — Assessment & Plan Note (Signed)
Continue prednisone Follow up with endocrinology

## 2023-07-31 NOTE — Assessment & Plan Note (Signed)
Continue Synthroid. Follow up with endocrinologist.

## 2023-07-31 NOTE — Assessment & Plan Note (Signed)
#  Stage IIIB lung adenocarcinoma, s/p concurrent chemo/RT and immunotherapy, finished all treatment in January 2020 Oct 2025 CT chest abdomen pelvis without contrast reviewed. No recurrence.  He is more than 5 year since he finishes treatment  Given his history of previous smoking history, recommend to continue CT annually.

## 2023-08-01 DIAGNOSIS — D649 Anemia, unspecified: Secondary | ICD-10-CM | POA: Diagnosis not present

## 2023-08-01 DIAGNOSIS — Z0000 Encounter for general adult medical examination without abnormal findings: Secondary | ICD-10-CM | POA: Diagnosis not present

## 2023-08-01 DIAGNOSIS — E039 Hypothyroidism, unspecified: Secondary | ICD-10-CM | POA: Diagnosis not present

## 2023-08-01 DIAGNOSIS — G959 Disease of spinal cord, unspecified: Secondary | ICD-10-CM | POA: Diagnosis not present

## 2023-08-01 DIAGNOSIS — I214 Non-ST elevation (NSTEMI) myocardial infarction: Secondary | ICD-10-CM | POA: Diagnosis not present

## 2023-08-01 DIAGNOSIS — N1832 Chronic kidney disease, stage 3b: Secondary | ICD-10-CM | POA: Diagnosis not present

## 2023-08-01 DIAGNOSIS — Z6835 Body mass index (BMI) 35.0-35.9, adult: Secondary | ICD-10-CM | POA: Diagnosis not present

## 2023-08-01 DIAGNOSIS — C3492 Malignant neoplasm of unspecified part of left bronchus or lung: Secondary | ICD-10-CM | POA: Diagnosis not present

## 2023-08-01 DIAGNOSIS — J4550 Severe persistent asthma, uncomplicated: Secondary | ICD-10-CM | POA: Diagnosis not present

## 2023-08-06 DIAGNOSIS — M51362 Other intervertebral disc degeneration, lumbar region with discogenic back pain and lower extremity pain: Secondary | ICD-10-CM | POA: Diagnosis not present

## 2023-08-06 DIAGNOSIS — R200 Anesthesia of skin: Secondary | ICD-10-CM | POA: Diagnosis not present

## 2023-08-10 DIAGNOSIS — R600 Localized edema: Secondary | ICD-10-CM | POA: Diagnosis not present

## 2023-08-10 DIAGNOSIS — E66812 Obesity, class 2: Secondary | ICD-10-CM | POA: Diagnosis not present

## 2023-08-10 DIAGNOSIS — C3492 Malignant neoplasm of unspecified part of left bronchus or lung: Secondary | ICD-10-CM | POA: Diagnosis not present

## 2023-08-10 DIAGNOSIS — G4733 Obstructive sleep apnea (adult) (pediatric): Secondary | ICD-10-CM | POA: Diagnosis not present

## 2023-08-10 DIAGNOSIS — Z6835 Body mass index (BMI) 35.0-35.9, adult: Secondary | ICD-10-CM | POA: Diagnosis not present

## 2023-08-10 DIAGNOSIS — J8283 Eosinophilic asthma: Secondary | ICD-10-CM | POA: Diagnosis not present

## 2023-08-10 DIAGNOSIS — J449 Chronic obstructive pulmonary disease, unspecified: Secondary | ICD-10-CM | POA: Diagnosis not present

## 2023-08-10 DIAGNOSIS — R0602 Shortness of breath: Secondary | ICD-10-CM | POA: Diagnosis not present

## 2023-08-10 DIAGNOSIS — Z8701 Personal history of pneumonia (recurrent): Secondary | ICD-10-CM | POA: Diagnosis not present

## 2023-08-10 DIAGNOSIS — Z79899 Other long term (current) drug therapy: Secondary | ICD-10-CM | POA: Diagnosis not present

## 2023-08-11 DIAGNOSIS — G4733 Obstructive sleep apnea (adult) (pediatric): Secondary | ICD-10-CM | POA: Diagnosis not present

## 2023-08-14 ENCOUNTER — Inpatient Hospital Stay

## 2023-08-14 VITALS — BP 134/76 | HR 64 | Temp 98.1°F | Resp 18

## 2023-08-14 DIAGNOSIS — D509 Iron deficiency anemia, unspecified: Secondary | ICD-10-CM | POA: Diagnosis not present

## 2023-08-14 DIAGNOSIS — E860 Dehydration: Secondary | ICD-10-CM

## 2023-08-14 DIAGNOSIS — C3492 Malignant neoplasm of unspecified part of left bronchus or lung: Secondary | ICD-10-CM

## 2023-08-14 MED ORDER — IRON SUCROSE 20 MG/ML IV SOLN
200.0000 mg | Freq: Once | INTRAVENOUS | Status: AC
  Administered 2023-08-14: 200 mg via INTRAVENOUS

## 2023-08-14 MED ORDER — SODIUM CHLORIDE 0.9% FLUSH
10.0000 mL | Freq: Once | INTRAVENOUS | Status: AC | PRN
  Administered 2023-08-14: 10 mL
  Filled 2023-08-14: qty 10

## 2023-08-15 DIAGNOSIS — J4540 Moderate persistent asthma, uncomplicated: Secondary | ICD-10-CM | POA: Diagnosis not present

## 2023-08-15 DIAGNOSIS — J8283 Eosinophilic asthma: Secondary | ICD-10-CM | POA: Diagnosis not present

## 2023-08-29 DIAGNOSIS — J8283 Eosinophilic asthma: Secondary | ICD-10-CM | POA: Diagnosis not present

## 2023-08-29 DIAGNOSIS — J4540 Moderate persistent asthma, uncomplicated: Secondary | ICD-10-CM | POA: Diagnosis not present

## 2023-09-03 DIAGNOSIS — E2749 Other adrenocortical insufficiency: Secondary | ICD-10-CM | POA: Diagnosis not present

## 2023-09-03 DIAGNOSIS — E039 Hypothyroidism, unspecified: Secondary | ICD-10-CM | POA: Diagnosis not present

## 2023-09-03 DIAGNOSIS — R200 Anesthesia of skin: Secondary | ICD-10-CM | POA: Diagnosis not present

## 2023-09-05 ENCOUNTER — Other Ambulatory Visit: Payer: Self-pay | Admitting: Student in an Organized Health Care Education/Training Program

## 2023-09-05 DIAGNOSIS — G8929 Other chronic pain: Secondary | ICD-10-CM

## 2023-09-05 DIAGNOSIS — G894 Chronic pain syndrome: Secondary | ICD-10-CM

## 2023-09-05 DIAGNOSIS — S22000S Wedge compression fracture of unspecified thoracic vertebra, sequela: Secondary | ICD-10-CM

## 2023-09-05 DIAGNOSIS — M51362 Other intervertebral disc degeneration, lumbar region with discogenic back pain and lower extremity pain: Secondary | ICD-10-CM

## 2023-09-05 DIAGNOSIS — M47816 Spondylosis without myelopathy or radiculopathy, lumbar region: Secondary | ICD-10-CM

## 2023-09-10 DIAGNOSIS — E669 Obesity, unspecified: Secondary | ICD-10-CM | POA: Diagnosis not present

## 2023-09-10 DIAGNOSIS — Z6835 Body mass index (BMI) 35.0-35.9, adult: Secondary | ICD-10-CM | POA: Diagnosis not present

## 2023-09-10 DIAGNOSIS — E039 Hypothyroidism, unspecified: Secondary | ICD-10-CM | POA: Diagnosis not present

## 2023-09-10 DIAGNOSIS — E2749 Other adrenocortical insufficiency: Secondary | ICD-10-CM | POA: Diagnosis not present

## 2023-09-11 DIAGNOSIS — Z961 Presence of intraocular lens: Secondary | ICD-10-CM | POA: Diagnosis not present

## 2023-09-12 DIAGNOSIS — J4540 Moderate persistent asthma, uncomplicated: Secondary | ICD-10-CM | POA: Diagnosis not present

## 2023-09-12 DIAGNOSIS — J8283 Eosinophilic asthma: Secondary | ICD-10-CM | POA: Diagnosis not present

## 2023-09-17 DIAGNOSIS — G5602 Carpal tunnel syndrome, left upper limb: Secondary | ICD-10-CM | POA: Diagnosis not present

## 2023-09-25 DIAGNOSIS — G5601 Carpal tunnel syndrome, right upper limb: Secondary | ICD-10-CM | POA: Diagnosis not present

## 2023-09-26 DIAGNOSIS — J8283 Eosinophilic asthma: Secondary | ICD-10-CM | POA: Diagnosis not present

## 2023-09-26 DIAGNOSIS — J4540 Moderate persistent asthma, uncomplicated: Secondary | ICD-10-CM | POA: Diagnosis not present

## 2023-09-29 DIAGNOSIS — G4733 Obstructive sleep apnea (adult) (pediatric): Secondary | ICD-10-CM | POA: Diagnosis not present

## 2023-10-11 DIAGNOSIS — J449 Chronic obstructive pulmonary disease, unspecified: Secondary | ICD-10-CM | POA: Diagnosis not present

## 2023-10-11 DIAGNOSIS — J8283 Eosinophilic asthma: Secondary | ICD-10-CM | POA: Diagnosis not present

## 2023-10-16 ENCOUNTER — Encounter (INDEPENDENT_AMBULATORY_CARE_PROVIDER_SITE_OTHER): Payer: Self-pay

## 2023-10-25 DIAGNOSIS — J8283 Eosinophilic asthma: Secondary | ICD-10-CM | POA: Diagnosis not present

## 2023-10-25 DIAGNOSIS — J449 Chronic obstructive pulmonary disease, unspecified: Secondary | ICD-10-CM | POA: Diagnosis not present

## 2023-11-07 DIAGNOSIS — R0689 Other abnormalities of breathing: Secondary | ICD-10-CM | POA: Diagnosis not present

## 2023-11-07 DIAGNOSIS — J8283 Eosinophilic asthma: Secondary | ICD-10-CM | POA: Diagnosis not present

## 2023-11-07 DIAGNOSIS — C3492 Malignant neoplasm of unspecified part of left bronchus or lung: Secondary | ICD-10-CM | POA: Diagnosis not present

## 2023-11-07 DIAGNOSIS — J701 Chronic and other pulmonary manifestations due to radiation: Secondary | ICD-10-CM | POA: Diagnosis not present

## 2023-11-07 DIAGNOSIS — R0600 Dyspnea, unspecified: Secondary | ICD-10-CM | POA: Diagnosis not present

## 2023-11-21 DIAGNOSIS — J4489 Other specified chronic obstructive pulmonary disease: Secondary | ICD-10-CM | POA: Diagnosis not present

## 2023-11-21 DIAGNOSIS — K219 Gastro-esophageal reflux disease without esophagitis: Secondary | ICD-10-CM | POA: Diagnosis not present

## 2023-11-21 DIAGNOSIS — Z7902 Long term (current) use of antithrombotics/antiplatelets: Secondary | ICD-10-CM | POA: Diagnosis not present

## 2023-11-21 DIAGNOSIS — K5909 Other constipation: Secondary | ICD-10-CM | POA: Diagnosis not present

## 2023-11-21 DIAGNOSIS — D500 Iron deficiency anemia secondary to blood loss (chronic): Secondary | ICD-10-CM | POA: Diagnosis not present

## 2023-11-21 DIAGNOSIS — Z860101 Personal history of adenomatous and serrated colon polyps: Secondary | ICD-10-CM | POA: Diagnosis not present

## 2023-11-22 DIAGNOSIS — E2740 Unspecified adrenocortical insufficiency: Secondary | ICD-10-CM | POA: Diagnosis not present

## 2023-11-22 DIAGNOSIS — J8283 Eosinophilic asthma: Secondary | ICD-10-CM | POA: Diagnosis not present

## 2023-11-22 DIAGNOSIS — J449 Chronic obstructive pulmonary disease, unspecified: Secondary | ICD-10-CM | POA: Diagnosis not present

## 2023-11-22 DIAGNOSIS — C3492 Malignant neoplasm of unspecified part of left bronchus or lung: Secondary | ICD-10-CM | POA: Diagnosis not present

## 2023-11-22 DIAGNOSIS — R0602 Shortness of breath: Secondary | ICD-10-CM | POA: Diagnosis not present

## 2023-11-22 DIAGNOSIS — D472 Monoclonal gammopathy: Secondary | ICD-10-CM | POA: Diagnosis not present

## 2023-11-22 DIAGNOSIS — I214 Non-ST elevation (NSTEMI) myocardial infarction: Secondary | ICD-10-CM | POA: Diagnosis not present

## 2023-11-22 DIAGNOSIS — N1831 Chronic kidney disease, stage 3a: Secondary | ICD-10-CM | POA: Diagnosis not present

## 2023-11-27 ENCOUNTER — Inpatient Hospital Stay: Attending: Oncology

## 2023-11-27 DIAGNOSIS — Z87891 Personal history of nicotine dependence: Secondary | ICD-10-CM | POA: Insufficient documentation

## 2023-11-27 DIAGNOSIS — E032 Hypothyroidism due to medicaments and other exogenous substances: Secondary | ICD-10-CM | POA: Diagnosis not present

## 2023-11-27 DIAGNOSIS — D509 Iron deficiency anemia, unspecified: Secondary | ICD-10-CM | POA: Diagnosis not present

## 2023-11-27 DIAGNOSIS — Z85118 Personal history of other malignant neoplasm of bronchus and lung: Secondary | ICD-10-CM | POA: Insufficient documentation

## 2023-11-27 DIAGNOSIS — Z803 Family history of malignant neoplasm of breast: Secondary | ICD-10-CM | POA: Insufficient documentation

## 2023-11-27 DIAGNOSIS — Z923 Personal history of irradiation: Secondary | ICD-10-CM | POA: Diagnosis not present

## 2023-11-27 DIAGNOSIS — D472 Monoclonal gammopathy: Secondary | ICD-10-CM | POA: Diagnosis not present

## 2023-11-27 DIAGNOSIS — Z8546 Personal history of malignant neoplasm of prostate: Secondary | ICD-10-CM | POA: Diagnosis not present

## 2023-11-27 DIAGNOSIS — C3492 Malignant neoplasm of unspecified part of left bronchus or lung: Secondary | ICD-10-CM

## 2023-11-27 DIAGNOSIS — F32A Depression, unspecified: Secondary | ICD-10-CM | POA: Diagnosis not present

## 2023-11-27 DIAGNOSIS — Z9221 Personal history of antineoplastic chemotherapy: Secondary | ICD-10-CM | POA: Insufficient documentation

## 2023-11-27 DIAGNOSIS — E2740 Unspecified adrenocortical insufficiency: Secondary | ICD-10-CM | POA: Diagnosis not present

## 2023-11-27 LAB — CBC WITH DIFFERENTIAL (CANCER CENTER ONLY)
Abs Immature Granulocytes: 0.05 10*3/uL (ref 0.00–0.07)
Basophils Absolute: 0 10*3/uL (ref 0.0–0.1)
Basophils Relative: 0 %
Eosinophils Absolute: 0 10*3/uL (ref 0.0–0.5)
Eosinophils Relative: 0 %
HCT: 32.5 % — ABNORMAL LOW (ref 39.0–52.0)
Hemoglobin: 10 g/dL — ABNORMAL LOW (ref 13.0–17.0)
Immature Granulocytes: 1 %
Lymphocytes Relative: 8 %
Lymphs Abs: 0.5 10*3/uL — ABNORMAL LOW (ref 0.7–4.0)
MCH: 27 pg (ref 26.0–34.0)
MCHC: 30.8 g/dL (ref 30.0–36.0)
MCV: 87.8 fL (ref 80.0–100.0)
Monocytes Absolute: 0.5 10*3/uL (ref 0.1–1.0)
Monocytes Relative: 7 %
Neutro Abs: 5.5 10*3/uL (ref 1.7–7.7)
Neutrophils Relative %: 84 %
Platelet Count: 179 10*3/uL (ref 150–400)
RBC: 3.7 MIL/uL — ABNORMAL LOW (ref 4.22–5.81)
RDW: 16.7 % — ABNORMAL HIGH (ref 11.5–15.5)
WBC Count: 6.6 10*3/uL (ref 4.0–10.5)
nRBC: 0 % (ref 0.0–0.2)

## 2023-11-27 LAB — IRON AND TIBC
Iron: 37 ug/dL — ABNORMAL LOW (ref 45–182)
Saturation Ratios: 9 % — ABNORMAL LOW (ref 17.9–39.5)
TIBC: 409 ug/dL (ref 250–450)
UIBC: 372 ug/dL

## 2023-11-27 LAB — FERRITIN: Ferritin: 14 ng/mL — ABNORMAL LOW (ref 24–336)

## 2023-11-28 LAB — KAPPA/LAMBDA LIGHT CHAINS
Kappa free light chain: 43.6 mg/L — ABNORMAL HIGH (ref 3.3–19.4)
Kappa, lambda light chain ratio: 1.48 (ref 0.26–1.65)
Lambda free light chains: 29.5 mg/L — ABNORMAL HIGH (ref 5.7–26.3)

## 2023-11-29 LAB — MULTIPLE MYELOMA PANEL, SERUM
Albumin SerPl Elph-Mcnc: 3.3 g/dL (ref 2.9–4.4)
Albumin/Glob SerPl: 1.4 (ref 0.7–1.7)
Alpha 1: 0.2 g/dL (ref 0.0–0.4)
Alpha2 Glob SerPl Elph-Mcnc: 0.6 g/dL (ref 0.4–1.0)
B-Globulin SerPl Elph-Mcnc: 1 g/dL (ref 0.7–1.3)
Gamma Glob SerPl Elph-Mcnc: 0.6 g/dL (ref 0.4–1.8)
Globulin, Total: 2.4 g/dL (ref 2.2–3.9)
IgA: 232 mg/dL (ref 61–437)
IgG (Immunoglobin G), Serum: 663 mg/dL (ref 603–1613)
IgM (Immunoglobulin M), Srm: 51 mg/dL (ref 15–143)
M Protein SerPl Elph-Mcnc: 0.2 g/dL — ABNORMAL HIGH
Total Protein ELP: 5.7 g/dL — ABNORMAL LOW (ref 6.0–8.5)

## 2023-12-04 ENCOUNTER — Inpatient Hospital Stay

## 2023-12-04 ENCOUNTER — Encounter: Payer: Self-pay | Admitting: Oncology

## 2023-12-04 ENCOUNTER — Inpatient Hospital Stay (HOSPITAL_BASED_OUTPATIENT_CLINIC_OR_DEPARTMENT_OTHER): Admitting: Oncology

## 2023-12-04 VITALS — BP 119/68 | HR 67 | Temp 98.3°F | Resp 18

## 2023-12-04 VITALS — BP 140/73 | HR 79 | Temp 98.3°F | Resp 18

## 2023-12-04 DIAGNOSIS — E032 Hypothyroidism due to medicaments and other exogenous substances: Secondary | ICD-10-CM | POA: Diagnosis not present

## 2023-12-04 DIAGNOSIS — D509 Iron deficiency anemia, unspecified: Secondary | ICD-10-CM

## 2023-12-04 DIAGNOSIS — E2740 Unspecified adrenocortical insufficiency: Secondary | ICD-10-CM

## 2023-12-04 DIAGNOSIS — Z8546 Personal history of malignant neoplasm of prostate: Secondary | ICD-10-CM

## 2023-12-04 DIAGNOSIS — C3492 Malignant neoplasm of unspecified part of left bronchus or lung: Secondary | ICD-10-CM | POA: Diagnosis not present

## 2023-12-04 DIAGNOSIS — F32A Depression, unspecified: Secondary | ICD-10-CM | POA: Insufficient documentation

## 2023-12-04 DIAGNOSIS — E860 Dehydration: Secondary | ICD-10-CM

## 2023-12-04 DIAGNOSIS — D472 Monoclonal gammopathy: Secondary | ICD-10-CM

## 2023-12-04 MED ORDER — SODIUM CHLORIDE 0.9% FLUSH
10.0000 mL | Freq: Once | INTRAVENOUS | Status: AC | PRN
  Administered 2023-12-04: 10 mL
  Filled 2023-12-04: qty 10

## 2023-12-04 MED ORDER — IRON SUCROSE 20 MG/ML IV SOLN
200.0000 mg | Freq: Once | INTRAVENOUS | Status: AC
  Administered 2023-12-04: 200 mg via INTRAVENOUS

## 2023-12-04 NOTE — Progress Notes (Signed)
 Hematology/Oncology Progress note [phone] [phone]     Reason for Visit:  Follow up for lung cancer   ASSESSMENT & PLAN:   Cancer Staging  Adenocarcinoma of left lung, stage 3 (HCC) Staging form: Lung, AJCC 8th Edition - Clinical stage from 02/14/2017: Stage IIIB (cT2b, cN3, cM0) - Signed by Babara Call, MD on 02/14/2017   IDA (iron  deficiency anemia) Lab Results  Component Value Date   HGB 10.0 (L) 11/27/2023   TIBC 409 11/27/2023   IRONPCTSAT 9 (L) 11/27/2023   FERRITIN 14 (L) 11/27/2023    Recommend IV venofer  weekly x 4 Etiology of IDA is unknown. Likely GI blood loss due to dual antiplatelet therapy. He has upcoming colonoscopy   Adenocarcinoma of left lung, stage 3 (HCC) #Stage IIIB lung adenocarcinoma, s/p concurrent chemo/RT and immunotherapy, finished all treatment in January 2020 Oct 2025 CT chest abdomen pelvis without contrast reviewed. No recurrence.  He is more than 5 year since he finishes treatment  Given his history of previous smoking history, recommend to continue CT annually-October 2025..   Adrenal insufficiency (HCC) Continue prednisone  Follow up with endocrinology   History of prostate cancer # TURP procedure done on 02/14/2019.  Pathology showed prostate adenocarcinoma, Gleason score 3+3, tumor quantitation, estimated percentage of prostate tissue involved by tumor less than 5%.No perineural invasion identified. Recommend patient to follows up with urologist    Hypothyroidism due to medication Continue Synthroid . Follow up with endocrinologist.   MGUS (monoclonal gammopathy of unknown significance) Lab Results  Component Value Date   MPROTEIN 0.2 (H) 11/27/2023   KPAFRELGTCHN 43.6 (H) 11/27/2023   LAMBDASER 29.5 (H) 11/27/2023   KAPLAMBRATIO 1.48 11/27/2023    Stable M protein Continue observation.  Depression Patient has history of depression, currently not on any antidepressants. He has felt down recently due  to life stressors.  No suicidal ideation. Patient to discuss with primary care provider for further evaluation.   Orders Placed This Encounter  Procedures   CT Chest Wo Contrast    Standing Status:   Future    Expected Date:   03/05/2024    Expiration Date:   12/03/2024    Preferred imaging location?:   [facility]   CBC with Differential (Cancer Center Only)    Standing Status:   Future    Expected Date:   03/05/2024    Expiration Date:   06/03/2024   CMP (Cancer Center only)    Standing Status:   Future    Expected Date:   03/05/2024    Expiration Date:   06/03/2024   Iron  and TIBC    Standing Status:   Future    Expected Date:   03/05/2024    Expiration Date:   06/03/2024   Ferritin    Standing Status:   Future    Expected Date:   03/05/2024    Expiration Date:   06/03/2024   Follow up in 3 months.   All questions were answered. The patient knows to call the clinic with any problems, questions or concerns.  Call Babara, MD, PhD [facility] Hematology Oncology 12/04/2023    HISTORY OF PRESENTING ILLNESS:  Marc Gray 78 y.o.  male who presents for treatment of Stage IIIB (cT2b, cN3, cM0)  Adenocarcinoma of lung.  # Patient is s/p carbo and taxol  concurrent RT for treatment of Stage IIIB Lung cancer. He recieved additional lung boost RT finished one year of Durvalumab  -January 2020  # Colon CA was listed in  his history, however reviewing his surgical pathology from colonoscopy on 04/15/2014, during which he had polyps removed and all are negative for cancer.patient is scheduled for colonoscopy screening this year.  # 5/1/ 2019 CT scan was discussed at tumor board and changes consistent with radiation changes  # TURP procedure done on 02/14/2019.  Pathology showed prostate adenocarcinoma, Gleason score 3+3, tumor quantitation, estimated percentage of prostate tissue involved by tumor less than 5%. No perineural invasion identified. Patient follows up with urologist  and was recommended to observe.  03/07/2021, CT without contrast showed unchanged posttreatment/postradiation fibrosis and consolidation of the perihilar left lung and a paramedian left upper lobe.  Unchanged 4 mm nodules of the right lower lobe.  Almost certainly benign and incidental.  Emphysema.  Coronary artery disease.  Medi port was removed.   August 2024, hospitalization due to NSTEMI.  Patient is currently on aspirin  and Effient  Patient  takes prednisone  10 mg in a.m., 2.5 mg in p.m.  INTERVAL HISTORY Patient he presents for follow-up of fo stage IIIB adenocarcinoma of the lung. Patient follows up with endocrinology for adrenal insufficiency and hypothyroidism.   Patient has felt depressed lately.  His sister lives with him. He is going to move out on his house and considering moving to assisted living. No fever chills, SOB or cough.   Review of Systems  Constitutional:  Negative for appetite change, chills, fatigue, fever and unexpected weight change.  HENT:   Negative for hearing loss and voice change.   Eyes:  Negative for eye problems and icterus.  Respiratory:  Negative for chest tightness, cough and shortness of breath.   Cardiovascular:  Negative for chest pain and leg swelling.  Gastrointestinal:  Negative for abdominal distention, abdominal pain and blood in stool.  Endocrine: Negative for hot flashes.  Genitourinary:  Negative for difficulty urinating, dysuria and frequency.   Musculoskeletal:  Positive for arthralgias and back pain.  Skin:  Negative for itching and rash.  Neurological:  Negative for extremity weakness, light-headedness and numbness.  Hematological:  Negative for adenopathy. Does not bruise/bleed easily.  Psychiatric/Behavioral:  Positive for depression. Negative for confusion.     MEDICAL HISTORY:  Past Medical History:  Diagnosis Date   Anxiety    Aortic atherosclerosis (HCC)    Arthritis    SHOULDER   BPH (benign prostatic hyperplasia)     Cataract    Chronic kidney disease    low kidney function   Colon adenomas    Colon cancer (HCC) 2015   Pt states during his colonoscopy he had cancerous polyps removed.    COPD (chronic obstructive pulmonary disease) (HCC)    Depression    SINCE DIAGNOSIS   Dyspnea    DOE   Enlarged prostate    GERD (gastroesophageal reflux disease)    Hypertension    Hypothyroidism    Lung cancer (HCC)    Aug 2018   Pain    DUE TO LUNG ISSUE    SURGICAL HISTORY: Past Surgical History:  Procedure Laterality Date   BACK SURGERY     kyphoplasty  T5-6   CATARACT EXTRACTION W/ INTRAOCULAR LENS  IMPLANT, BILATERAL     COLONOSCOPY     COLONOSCOPY WITH PROPOFOL  N/A 12/17/2019   Procedure: COLONOSCOPY WITH PROPOFOL ;  Surgeon: Dessa Reyes ORN, MD;  Location: [facility] ENDOSCOPY;  Service: Endoscopy;  Laterality: N/A;   CORONARY STENT INTERVENTION N/A 12/29/2022   Procedure: CORONARY STENT INTERVENTION;  Surgeon: Ammon Blunt, MD;  Location: [facility] INVASIVE CV LAB;  Service: Cardiovascular;  Laterality: N/A;   EYE SURGERY Bilateral    cataract   LEFT HEART CATH AND CORONARY ANGIOGRAPHY N/A 12/29/2022   Procedure: LEFT HEART CATH AND CORONARY ANGIOGRAPHY;  Surgeon: Ammon Blunt, MD;  Location: [facility] INVASIVE CV LAB;  Service: Cardiovascular;  Laterality: N/A;   LYMPH GLAND EXCISION N/A 02/09/2017   Procedure: CERVICAL LYMPH NODE BIOPSY;  Surgeon: Dessa Reyes ORN, MD;  Location: [facility] ORS;  Service: General;  Laterality: N/A;   PORTACATH PLACEMENT Right 02/23/2017   Procedure: INSERTION PORT-A-CATH;  Surgeon: Dessa Reyes ORN, MD;  Location: [facility] ORS;  Service: General;  Laterality: Right;   PROSTATE SURGERY     TONSILLECTOMY     TRANSURETHRAL RESECTION OF PROSTATE N/A 02/14/2019   Procedure: TRANSURETHRAL RESECTION OF THE PROSTATE (TURP);  Surgeon: Francisca Redell BROCKS, MD;  Location: [facility] ORS;  Service: Urology;  Laterality: N/A;    SOCIAL HISTORY: Social History   Socioeconomic History    Marital status: Married    Spouse name: Not on file   Number of children: Not on file   Years of education: Not on file   Highest education level: Not on file  Occupational History   Not on file  Tobacco Use   Smoking status: Former    Current packs/day: 0.00    Average packs/day: 1 pack/day for 53.0 years (53.0 ttl pk-yrs)    Types: Cigarettes    Start date: 04/14/1964    Quit date: 04/14/2017    Years since quitting: 6.6    Passive exposure: Past   Smokeless tobacco: Never   Tobacco comments:    Quit November 2018  Vaping Use   Vaping status: Never Used  Substance and Sexual Activity   Alcohol use: Not Currently    Comment: occassional   Drug use: No   Sexual activity: Yes  Other Topics Concern   Not on file  Social History Narrative   Not on file   Social Drivers of Health   Financial Resource Strain: Low Risk  (11/07/2023)   Received from [facility]   Overall Financial Resource Strain (CARDIA)    Difficulty of Paying Living Expenses: Not very hard  Food Insecurity: No Food Insecurity (11/07/2023)   Received from [facility]   Hunger Vital Sign    Within the past 12 months, you worried that your food would run out before you got the money to buy more.: Never true    Within the past 12 months, the food you bought just didn't last and you didn't have money to get more.: Never true  Transportation Needs: No Transportation Needs (11/07/2023)   Received from [facility] - Transportation    In the past 12 months, has lack of transportation kept you from medical appointments or from getting medications?: No    Lack of Transportation (Non-Medical): No  Physical Activity: Not on file  Stress: Not on file  Social Connections: Moderately Integrated (07/22/2023)   Social Connection and Isolation Panel    Frequency of Communication with Friends and Family: Three times a week    Frequency of Social Gatherings with  Friends and Family: Twice a week    Attends Religious Services: 1 to 4 times per year    Active Member of [employer] or Organizations: No    Attends Banker Meetings: Never    Marital Status: Married  Catering manager Violence: Not At Risk (07/22/2023)   Humiliation, Afraid, Rape, and Kick  questionnaire    Fear of Current or Ex-Partner: No    Emotionally Abused: No    Physically Abused: No    Sexually Abused: No    FAMILY HISTORY: Family History  Problem Relation Age of Onset   Breast cancer Maternal Grandmother    Dementia Mother    Diabetes Father    Stroke Father     ALLERGIES:  is allergic to morphine  and codeine.  MEDICATIONS:  Current Outpatient Medications  Medication Sig Dispense Refill   acetaminophen  (TYLENOL ) 650 MG CR tablet Take 1,300 mg by mouth every 8 (eight) hours as needed for pain.     amphetamine -dextroamphetamine  (ADDERALL) 20 MG tablet Take by mouth.     aspirin  EC 81 MG tablet Take 1 tablet (81 mg total) by mouth daily. Swallow whole. 90 tablet 0   Azelastine HCl 137 MCG/SPRAY SOLN SMARTSIG:1-2 Spray(s) Both Nares Twice Daily     BREZTRI AEROSPHERE 160-9-4.8 MCG/ACT AERO Inhale 2 puffs into the lungs 2 (two) times daily.     calcium -vitamin D  (OSCAL WITH D) 500-200 MG-UNIT tablet Take 1 tablet by mouth daily. 90 tablet 1   DUPIXENT 300 MG/2ML SOPN Inject 300 mg into the skin every 14 (fourteen) days.     EPINEPHrine 0.3 mg/0.3 mL IJ SOAJ injection Inject 0.3 mg into the muscle as needed.     fluticasone  (FLONASE ) 50 MCG/ACT nasal spray Place 1 spray into both nostrils daily. 16 g 2   furosemide  (LASIX ) 20 MG tablet Take 20 mg by mouth daily.     gabapentin  (NEURONTIN ) 100 MG capsule Take 1 capsule (100 mg total) by mouth 3 (three) times daily. 90 capsule 2   Ipratropium-Albuterol  (COMBIVENT) 20-100 MCG/ACT AERS respimat Inhale 1 puff into the lungs every 6 (six) hours as needed.     levothyroxine  (SYNTHROID ) 137 MCG tablet Take 137 mcg by mouth  daily before breakfast.     magnesium oxide (MAG-OX) 400 (240 Mg) MG tablet Take 400 mg by mouth daily.     Melatonin 10 MG TABS Take 10 mg by mouth at bedtime.      metoprolol  tartrate (LOPRESSOR ) 25 MG tablet Take 0.5 tablets (12.5 mg total) by mouth 2 (two) times daily. 30 tablet 2   montelukast  (SINGULAIR ) 10 MG tablet Take 10 mg by mouth daily.     Multiple Vitamin (MULTI-VITAMINS) TABS Take 1 tablet by mouth daily.      pantoprazole  (PROTONIX ) 40 MG tablet Take 1 tablet (40 mg total) by mouth daily. 90 tablet 0   prasugrel  (EFFIENT ) 10 MG TABS tablet Take 1 tablet (10 mg total) by mouth daily. 30 tablet 11   predniSONE  (DELTASONE ) 5 MG tablet TAKE TWO TABLETS BY MOUTH IN THE MORNING AND TAKE ONE-HALF Tablets  BY MOUTH IN THE AFTERNOON     rosuvastatin  (CRESTOR ) 10 MG tablet Take 10 mg by mouth daily.     tadalafil  (CIALIS ) 10 MG tablet Take 1-2 tablets (10-20 mg total) by mouth daily as needed for erectile dysfunction. Take 5-10mg  by mouth as needed prior to intercourse 30 tablet 11   tamsulosin  (FLOMAX ) 0.4 MG CAPS capsule Take 1 capsule (0.4 mg total) by mouth daily. 90 capsule 3   traMADol  (ULTRAM ) 50 MG tablet Take 50 mg by mouth every 6 (six) hours as needed for moderate pain (pain score 4-6). If he knows that he is going to be on his feet he will take a tablet to combat the pain     traZODone  (DESYREL ) 100 MG  tablet Take 100 mg by mouth at bedtime.     benzonatate  (TESSALON ) 200 MG capsule Take 1 capsule (200 mg total) by mouth 3 (three) times daily as needed for cough. (Patient not taking: Reported on 12/04/2023) 20 capsule 0   No current facility-administered medications for this visit.   Facility-Administered Medications Ordered in Other Visits  Medication Dose Route Frequency Provider Last Rate Last Admin   sodium chloride  flush (NS) 0.9 % injection 10 mL  10 mL Intravenous PRN Babara Call, MD   10 mL at 04/03/17 9166      .  PHYSICAL EXAMINATION: ECOG PERFORMANCE STATUS: 1 -  Symptomatic but completely ambulatory Vitals:   12/04/23 1445  BP: (!) 140/73  Pulse: 79  Resp: 18  Temp: 98.3 F (36.8 C)  SpO2: 95%    There were no vitals filed for this visit. Physical Exam Constitutional:      General: He is not in acute distress.    Appearance: He is obese. He is not diaphoretic.  HENT:     Head: Normocephalic and atraumatic.  Eyes:     General: No scleral icterus. Cardiovascular:     Rate and Rhythm: Normal rate and regular rhythm.  Pulmonary:     Effort: Pulmonary effort is normal. No respiratory distress.  Abdominal:     General: Bowel sounds are normal. There is no distension.     Palpations: Abdomen is soft.  Musculoskeletal:        General: Normal range of motion.     Cervical back: Normal range of motion and neck supple.  Skin:    General: Skin is warm and dry.  Neurological:     Mental Status: He is alert and oriented to person, place, and time. Mental status is at baseline.     Motor: No abnormal muscle tone.  Psychiatric:        Mood and Affect: Affect normal.     Comments: Denies suicidal ideation.    LABORATORY DATA:  I have reviewed the data as listed    Latest Ref Rng & Units 11/27/2023    4:03 PM 07/22/2023    9:34 AM 04/09/2023    3:34 AM  CBC  WBC 4.0 - 10.5 K/uL 6.6  10.8  6.9   Hemoglobin 13.0 - 17.0 g/dL 89.9  88.3  9.2   Hematocrit 39.0 - 52.0 % 32.5  38.1  31.5   Platelets 150 - 400 K/uL 179  167  206       Latest Ref Rng & Units 07/22/2023    9:34 AM 04/09/2023    3:34 AM 03/28/2023    9:59 AM  CMP  Glucose 70 - 99 mg/dL 883  886  882   BUN 8 - 23 mg/dL 28  33  34   Creatinine 0.61 - 1.24 mg/dL 8.75  8.47  8.46   Sodium 135 - 145 mmol/L 137  137  139   Potassium 3.5 - 5.1 mmol/L 3.8  3.9  3.3   Chloride 98 - 111 mmol/L 104  106  108   CO2 22 - 32 mmol/L 23  23  24    Calcium  8.9 - 10.3 mg/dL 8.6  8.7  8.3   Total Protein 6.5 - 8.1 g/dL 6.3   6.1   Total Bilirubin 0.0 - 1.2 mg/dL 0.5   0.6   Alkaline Phos 38  - 126 U/L 35   34   AST 15 - 41 U/L 20   20  ALT 0 - 44 U/L 21   22     RADIOGRAPHIC STUDIES: I have personally reviewed the radiological images as listed and agreed with the findings in the report. No results found.

## 2023-12-04 NOTE — Assessment & Plan Note (Signed)
#  Stage IIIB lung adenocarcinoma, s/p concurrent chemo/RT and immunotherapy, finished all treatment in January 2020 Oct 2025 CT chest abdomen pelvis without contrast reviewed. No recurrence.  He is more than 5 year since he finishes treatment  Given his history of previous smoking history, recommend to continue CT annually-October 2025.SABRA

## 2023-12-04 NOTE — Assessment & Plan Note (Signed)
Continue Synthroid. Follow up with endocrinologist.

## 2023-12-04 NOTE — Assessment & Plan Note (Addendum)
 Lab Results  Component Value Date   HGB 10.0 (L) 11/27/2023   TIBC 409 11/27/2023   IRONPCTSAT 9 (L) 11/27/2023   FERRITIN 14 (L) 11/27/2023    Recommend IV venofer  weekly x 4 Etiology of IDA is unknown. Likely GI blood loss due to dual antiplatelet therapy. He has upcoming colonoscopy

## 2023-12-04 NOTE — Assessment & Plan Note (Signed)
Continue prednisone Follow up with endocrinology

## 2023-12-04 NOTE — Assessment & Plan Note (Signed)
 Patient has history of depression, currently not on any antidepressants. He has felt down recently due to life stressors.  No suicidal ideation. Patient to discuss with primary care provider for further evaluation.

## 2023-12-04 NOTE — Assessment & Plan Note (Signed)
#   TURP procedure done on 02/14/2019.  Pathology showed prostate adenocarcinoma, Gleason score 3+3, tumor quantitation, estimated percentage of prostate tissue involved by tumor less than 5%.No perineural invasion identified. Recommend patient to follows up with urologist

## 2023-12-04 NOTE — Assessment & Plan Note (Signed)
 Lab Results  Component Value Date   MPROTEIN 0.2 (H) 11/27/2023   KPAFRELGTCHN 43.6 (H) 11/27/2023   LAMBDASER 29.5 (H) 11/27/2023   KAPLAMBRATIO 1.48 11/27/2023    Stable M protein Continue observation.

## 2023-12-06 DIAGNOSIS — J449 Chronic obstructive pulmonary disease, unspecified: Secondary | ICD-10-CM | POA: Diagnosis not present

## 2023-12-06 DIAGNOSIS — J8283 Eosinophilic asthma: Secondary | ICD-10-CM | POA: Diagnosis not present

## 2023-12-11 ENCOUNTER — Inpatient Hospital Stay

## 2023-12-11 VITALS — BP 119/67 | HR 65 | Temp 97.7°F | Resp 18

## 2023-12-11 DIAGNOSIS — C3492 Malignant neoplasm of unspecified part of left bronchus or lung: Secondary | ICD-10-CM

## 2023-12-11 DIAGNOSIS — D509 Iron deficiency anemia, unspecified: Secondary | ICD-10-CM | POA: Diagnosis not present

## 2023-12-11 DIAGNOSIS — E860 Dehydration: Secondary | ICD-10-CM

## 2023-12-11 MED ORDER — IRON SUCROSE 20 MG/ML IV SOLN
200.0000 mg | Freq: Once | INTRAVENOUS | Status: AC
  Administered 2023-12-11: 200 mg via INTRAVENOUS
  Filled 2023-12-11: qty 10

## 2023-12-11 NOTE — Patient Instructions (Signed)
 Iron Sucrose Injection What is this medication? IRON SUCROSE (EYE ern SOO krose) treats low levels of iron (iron deficiency anemia) in people with kidney disease. Iron is a mineral that plays an important role in making red blood cells, which carry oxygen from your lungs to the rest of your body. This medicine may be used for other purposes; ask your health care provider or pharmacist if you have questions. COMMON BRAND NAME(S): Venofer What should I tell my care team before I take this medication? They need to know if you have any of these conditions: Anemia not caused by low iron levels Heart disease High levels of iron in the blood Kidney disease Liver disease An unusual or allergic reaction to iron, other medications, foods, dyes, or preservatives Pregnant or trying to get pregnant Breastfeeding How should I use this medication? This medication is for infusion into a vein. It is given in a hospital or clinic setting. Talk to your care team about the use of this medication in children. While this medication may be prescribed for children as young as 2 years for selected conditions, precautions do apply. Overdosage: If you think you have taken too much of this medicine contact a poison control center or emergency room at once. NOTE: This medicine is only for you. Do not share this medicine with others. What if I miss a dose? Keep appointments for follow-up doses. It is important not to miss your dose. Call your care team if you are unable to keep an appointment. What may interact with this medication? Do not take this medication with any of the following: Deferoxamine Dimercaprol Other iron products This medication may also interact with the following: Chloramphenicol Deferasirox This list may not describe all possible interactions. Give your health care provider a list of all the medicines, herbs, non-prescription drugs, or dietary supplements you use. Also tell them if you smoke,  drink alcohol, or use illegal drugs. Some items may interact with your medicine. What should I watch for while using this medication? Visit your care team regularly. Tell your care team if your symptoms do not start to get better or if they get worse. You may need blood work done while you are taking this medication. You may need to follow a special diet. Talk to your care team. Foods that contain iron include: whole grains/cereals, dried fruits, beans, or peas, leafy green vegetables, and organ meats (liver, kidney). What side effects may I notice from receiving this medication? Side effects that you should report to your care team as soon as possible: Allergic reactions--skin rash, itching, hives, swelling of the face, lips, tongue, or throat Low blood pressure--dizziness, feeling faint or lightheaded, blurry vision Shortness of breath Side effects that usually do not require medical attention (report to your care team if they continue or are bothersome): Flushing Headache Joint pain Muscle pain Nausea Pain, redness, or irritation at injection site This list may not describe all possible side effects. Call your doctor for medical advice about side effects. You may report side effects to FDA at 1-800-FDA-1088. Where should I keep my medication? This medication is given in a hospital or clinic. It will not be stored at home. NOTE: This sheet is a summary. It may not cover all possible information. If you have questions about this medicine, talk to your doctor, pharmacist, or health care provider.  2024 Elsevier/Gold Standard (2022-10-20 00:00:00)

## 2023-12-18 ENCOUNTER — Inpatient Hospital Stay

## 2023-12-18 VITALS — BP 130/72 | HR 76 | Temp 97.8°F | Resp 18

## 2023-12-18 DIAGNOSIS — H1032 Unspecified acute conjunctivitis, left eye: Secondary | ICD-10-CM | POA: Diagnosis not present

## 2023-12-18 DIAGNOSIS — D509 Iron deficiency anemia, unspecified: Secondary | ICD-10-CM

## 2023-12-18 DIAGNOSIS — E860 Dehydration: Secondary | ICD-10-CM

## 2023-12-18 DIAGNOSIS — C3492 Malignant neoplasm of unspecified part of left bronchus or lung: Secondary | ICD-10-CM

## 2023-12-18 MED ORDER — IRON SUCROSE 20 MG/ML IV SOLN
200.0000 mg | Freq: Once | INTRAVENOUS | Status: AC
  Administered 2023-12-18: 200 mg via INTRAVENOUS
  Filled 2023-12-18: qty 10

## 2023-12-18 MED ORDER — SODIUM CHLORIDE 0.9% FLUSH
10.0000 mL | Freq: Once | INTRAVENOUS | Status: DC | PRN
Start: 2023-12-18 — End: 2023-12-18
  Filled 2023-12-18: qty 10

## 2023-12-18 NOTE — Patient Instructions (Signed)

## 2023-12-20 DIAGNOSIS — H1132 Conjunctival hemorrhage, left eye: Secondary | ICD-10-CM | POA: Diagnosis not present

## 2023-12-20 DIAGNOSIS — Z961 Presence of intraocular lens: Secondary | ICD-10-CM | POA: Diagnosis not present

## 2023-12-20 DIAGNOSIS — H01029 Squamous blepharitis unspecified eye, unspecified eyelid: Secondary | ICD-10-CM | POA: Diagnosis not present

## 2023-12-24 DIAGNOSIS — J449 Chronic obstructive pulmonary disease, unspecified: Secondary | ICD-10-CM | POA: Diagnosis not present

## 2023-12-24 DIAGNOSIS — J8283 Eosinophilic asthma: Secondary | ICD-10-CM | POA: Diagnosis not present

## 2023-12-25 ENCOUNTER — Inpatient Hospital Stay

## 2023-12-25 VITALS — BP 131/69 | HR 76 | Temp 97.3°F | Resp 18

## 2023-12-25 DIAGNOSIS — D509 Iron deficiency anemia, unspecified: Secondary | ICD-10-CM | POA: Diagnosis not present

## 2023-12-25 DIAGNOSIS — E860 Dehydration: Secondary | ICD-10-CM

## 2023-12-25 DIAGNOSIS — C3492 Malignant neoplasm of unspecified part of left bronchus or lung: Secondary | ICD-10-CM

## 2023-12-25 MED ORDER — IRON SUCROSE 20 MG/ML IV SOLN
200.0000 mg | Freq: Once | INTRAVENOUS | Status: AC
  Administered 2023-12-25: 200 mg via INTRAVENOUS
  Filled 2023-12-25: qty 10

## 2023-12-25 NOTE — Patient Instructions (Signed)

## 2024-01-07 DIAGNOSIS — E039 Hypothyroidism, unspecified: Secondary | ICD-10-CM | POA: Diagnosis not present

## 2024-01-07 DIAGNOSIS — E2749 Other adrenocortical insufficiency: Secondary | ICD-10-CM | POA: Diagnosis not present

## 2024-01-11 DIAGNOSIS — J449 Chronic obstructive pulmonary disease, unspecified: Secondary | ICD-10-CM | POA: Diagnosis not present

## 2024-01-11 DIAGNOSIS — J8283 Eosinophilic asthma: Secondary | ICD-10-CM | POA: Diagnosis not present

## 2024-01-14 DIAGNOSIS — E039 Hypothyroidism, unspecified: Secondary | ICD-10-CM | POA: Diagnosis not present

## 2024-01-14 DIAGNOSIS — E2749 Other adrenocortical insufficiency: Secondary | ICD-10-CM | POA: Diagnosis not present

## 2024-01-17 ENCOUNTER — Encounter: Payer: Self-pay | Admitting: Urology

## 2024-01-25 DIAGNOSIS — J8283 Eosinophilic asthma: Secondary | ICD-10-CM | POA: Diagnosis not present

## 2024-01-25 DIAGNOSIS — J449 Chronic obstructive pulmonary disease, unspecified: Secondary | ICD-10-CM | POA: Diagnosis not present

## 2024-01-29 ENCOUNTER — Encounter: Payer: Self-pay | Admitting: Internal Medicine

## 2024-02-04 ENCOUNTER — Encounter: Payer: Self-pay | Admitting: Internal Medicine

## 2024-02-05 ENCOUNTER — Encounter
Admission: RE | Disposition: A | Discharge status: Home / Self Care | Payer: Self-pay | Source: Home / Self Care | Attending: Internal Medicine

## 2024-02-05 ENCOUNTER — Ambulatory Visit
Admission: RE | Admit: 2024-02-05 | Discharge: 2024-02-05 | Disposition: A | Discharge status: Home / Self Care | Attending: Internal Medicine | Admitting: Internal Medicine

## 2024-02-05 DIAGNOSIS — D500 Iron deficiency anemia secondary to blood loss (chronic): Secondary | ICD-10-CM | POA: Insufficient documentation

## 2024-02-05 DIAGNOSIS — K219 Gastro-esophageal reflux disease without esophagitis: Secondary | ICD-10-CM | POA: Insufficient documentation

## 2024-02-05 DIAGNOSIS — Z860101 Personal history of adenomatous and serrated colon polyps: Secondary | ICD-10-CM | POA: Diagnosis not present

## 2024-02-05 DIAGNOSIS — Z538 Procedure and treatment not carried out for other reasons: Secondary | ICD-10-CM | POA: Insufficient documentation

## 2024-02-05 DIAGNOSIS — K5909 Other constipation: Secondary | ICD-10-CM | POA: Insufficient documentation

## 2024-02-05 HISTORY — DX: Peripheral vascular disease, unspecified: I73.9

## 2024-02-05 HISTORY — PX: ESOPHAGOGASTRODUODENOSCOPY: SHX5428

## 2024-02-05 HISTORY — DX: Other intervertebral disc degeneration, lumbar region with discogenic back pain and lower extremity pain: M51.362

## 2024-02-05 HISTORY — DX: Atherosclerotic heart disease of native coronary artery without angina pectoris: I25.10

## 2024-02-05 HISTORY — PX: COLONOSCOPY: SHX5424

## 2024-02-05 HISTORY — DX: Personal history of malignant neoplasm of prostate: Z85.46

## 2024-02-05 HISTORY — DX: Monoclonal gammopathy: D47.2

## 2024-02-05 HISTORY — DX: Emphysema, unspecified: J43.9

## 2024-02-05 HISTORY — DX: Pneumonia, unspecified organism: J18.9

## 2024-02-05 HISTORY — DX: Acute myocardial infarction, unspecified: I21.9

## 2024-02-05 HISTORY — DX: Iron deficiency anemia, unspecified: D50.9

## 2024-02-05 HISTORY — DX: Malignant neoplasm of unspecified part of left bronchus or lung: C34.92

## 2024-02-05 HISTORY — DX: Aneurysm of the ascending aorta, without rupture: I71.21

## 2024-02-05 HISTORY — DX: Unspecified adrenocortical insufficiency: E27.40

## 2024-02-05 HISTORY — DX: Wedge compression fracture of unspecified thoracic vertebra, initial encounter for closed fracture: S22.000A

## 2024-02-05 SURGERY — COLONOSCOPY
Anesthesia: General

## 2024-02-05 MED ORDER — SODIUM CHLORIDE 0.9 % IV SOLN: INTRAVENOUS | Status: DC

## 2024-02-05 NOTE — OR Nursing (Signed)
 PT ARRIVED TO UNIT REPORTING HE WAS NOT CLEAN FROM PREP. REPORTS STOOL LOOSE WATERY BUT NOT CLEAR. HAS CONSTIPATION. DR TOLEDO SPOKE WITH PT,PROCEDURE CANCELLED AND PT I/S TO CONTINUE STOOL SOFTENERS BID AND ADD MIRALAX 1 CAP every day. [facility] TO F/U WITH PT

## 2024-02-06 ENCOUNTER — Encounter: Payer: Self-pay | Admitting: Internal Medicine

## 2024-02-07 DIAGNOSIS — R252 Cramp and spasm: Secondary | ICD-10-CM | POA: Diagnosis not present

## 2024-02-07 DIAGNOSIS — G5603 Carpal tunnel syndrome, bilateral upper limbs: Secondary | ICD-10-CM | POA: Diagnosis not present

## 2024-02-08 DIAGNOSIS — J8283 Eosinophilic asthma: Secondary | ICD-10-CM | POA: Diagnosis not present

## 2024-02-08 DIAGNOSIS — J449 Chronic obstructive pulmonary disease, unspecified: Secondary | ICD-10-CM | POA: Diagnosis not present

## 2024-02-20 ENCOUNTER — Telehealth: Payer: Self-pay | Admitting: Student in an Organized Health Care Education/Training Program

## 2024-02-20 NOTE — Telephone Encounter (Signed)
 PT was last seen in Jan of this year. Lateef place a order for a procedure. PT needed a clearance to stop Effient . PT stated he no longer takes that medications wants to move forward with procedure. Just wanted to get a okay before I schedule patient. TY

## 2024-02-25 DIAGNOSIS — J4540 Moderate persistent asthma, uncomplicated: Secondary | ICD-10-CM | POA: Diagnosis not present

## 2024-02-25 DIAGNOSIS — J8283 Eosinophilic asthma: Secondary | ICD-10-CM | POA: Diagnosis not present

## 2024-02-26 ENCOUNTER — Other Ambulatory Visit: Payer: Self-pay

## 2024-02-26 ENCOUNTER — Ambulatory Visit
Admission: RE | Admit: 2024-02-26 | Discharge: 2024-02-26 | Disposition: A | Discharge status: Home / Self Care | Attending: Internal Medicine | Admitting: Internal Medicine

## 2024-02-26 ENCOUNTER — Encounter
Admission: RE | Disposition: A | Discharge status: Home / Self Care | Payer: Self-pay | Source: Home / Self Care | Attending: Internal Medicine

## 2024-02-26 ENCOUNTER — Ambulatory Visit: Admitting: Anesthesiology

## 2024-02-26 ENCOUNTER — Encounter: Payer: Self-pay | Admitting: Internal Medicine

## 2024-02-26 DIAGNOSIS — D500 Iron deficiency anemia secondary to blood loss (chronic): Secondary | ICD-10-CM | POA: Diagnosis not present

## 2024-02-26 DIAGNOSIS — K5909 Other constipation: Secondary | ICD-10-CM | POA: Diagnosis not present

## 2024-02-26 DIAGNOSIS — K649 Unspecified hemorrhoids: Secondary | ICD-10-CM | POA: Diagnosis not present

## 2024-02-26 DIAGNOSIS — J449 Chronic obstructive pulmonary disease, unspecified: Secondary | ICD-10-CM | POA: Insufficient documentation

## 2024-02-26 DIAGNOSIS — K2950 Unspecified chronic gastritis without bleeding: Secondary | ICD-10-CM | POA: Diagnosis not present

## 2024-02-26 DIAGNOSIS — D122 Benign neoplasm of ascending colon: Secondary | ICD-10-CM | POA: Diagnosis not present

## 2024-02-26 DIAGNOSIS — K635 Polyp of colon: Secondary | ICD-10-CM | POA: Diagnosis not present

## 2024-02-26 DIAGNOSIS — Z87891 Personal history of nicotine dependence: Secondary | ICD-10-CM | POA: Insufficient documentation

## 2024-02-26 DIAGNOSIS — K640 First degree hemorrhoids: Secondary | ICD-10-CM | POA: Diagnosis not present

## 2024-02-26 DIAGNOSIS — Z1211 Encounter for screening for malignant neoplasm of colon: Secondary | ICD-10-CM | POA: Insufficient documentation

## 2024-02-26 DIAGNOSIS — I252 Old myocardial infarction: Secondary | ICD-10-CM | POA: Insufficient documentation

## 2024-02-26 DIAGNOSIS — D509 Iron deficiency anemia, unspecified: Secondary | ICD-10-CM | POA: Diagnosis not present

## 2024-02-26 DIAGNOSIS — Z860101 Personal history of adenomatous and serrated colon polyps: Secondary | ICD-10-CM | POA: Diagnosis not present

## 2024-02-26 DIAGNOSIS — E039 Hypothyroidism, unspecified: Secondary | ICD-10-CM | POA: Insufficient documentation

## 2024-02-26 DIAGNOSIS — K2960 Other gastritis without bleeding: Secondary | ICD-10-CM | POA: Diagnosis not present

## 2024-02-26 DIAGNOSIS — K2930 Chronic superficial gastritis without bleeding: Secondary | ICD-10-CM | POA: Insufficient documentation

## 2024-02-26 DIAGNOSIS — I10 Essential (primary) hypertension: Secondary | ICD-10-CM | POA: Insufficient documentation

## 2024-02-26 DIAGNOSIS — K2100 Gastro-esophageal reflux disease with esophagitis, without bleeding: Secondary | ICD-10-CM | POA: Diagnosis not present

## 2024-02-26 DIAGNOSIS — K219 Gastro-esophageal reflux disease without esophagitis: Secondary | ICD-10-CM | POA: Insufficient documentation

## 2024-02-26 DIAGNOSIS — I2510 Atherosclerotic heart disease of native coronary artery without angina pectoris: Secondary | ICD-10-CM | POA: Diagnosis not present

## 2024-02-26 DIAGNOSIS — D120 Benign neoplasm of cecum: Secondary | ICD-10-CM | POA: Diagnosis not present

## 2024-02-26 DIAGNOSIS — D123 Benign neoplasm of transverse colon: Secondary | ICD-10-CM | POA: Diagnosis not present

## 2024-02-26 DIAGNOSIS — K2289 Other specified disease of esophagus: Secondary | ICD-10-CM | POA: Diagnosis not present

## 2024-02-26 HISTORY — PX: ESOPHAGEAL BRUSHING: SHX6842

## 2024-02-26 HISTORY — PX: HEMOSTASIS CLIP PLACEMENT: SHX6857

## 2024-02-26 HISTORY — PX: POLYPECTOMY: SHX149

## 2024-02-26 HISTORY — PX: ESOPHAGOGASTRODUODENOSCOPY: SHX5428

## 2024-02-26 HISTORY — PX: COLONOSCOPY: SHX5424

## 2024-02-26 LAB — KOH PREP: Special Requests: NORMAL

## 2024-02-26 SURGERY — COLONOSCOPY
Anesthesia: General

## 2024-02-26 MED ORDER — PROPOFOL 500 MG/50ML IV EMUL
INTRAVENOUS | Status: DC | PRN
  Administered 2024-02-26: 50 ug/kg/min via INTRAVENOUS

## 2024-02-26 MED ORDER — PROPOFOL 10 MG/ML IV BOLUS
INTRAVENOUS | Status: DC | PRN
  Administered 2024-02-26: 40 mg via INTRAVENOUS
  Administered 2024-02-26 (×2): 30 mg via INTRAVENOUS

## 2024-02-26 MED ORDER — LIDOCAINE HCL (CARDIAC) PF 100 MG/5ML IV SOSY
PREFILLED_SYRINGE | INTRAVENOUS | Status: DC | PRN
  Administered 2024-02-26: 80 mg via INTRAVENOUS

## 2024-02-26 MED ORDER — SODIUM CHLORIDE 0.9 % IV SOLN: INTRAVENOUS | Status: DC

## 2024-02-26 MED ORDER — DEXMEDETOMIDINE HCL IN NACL 80 MCG/20ML IV SOLN
INTRAVENOUS | Status: DC | PRN
  Administered 2024-02-26: 12 ug via INTRAVENOUS
  Administered 2024-02-26: 8 ug via INTRAVENOUS

## 2024-02-26 MED ORDER — GLYCOPYRROLATE 0.2 MG/ML IJ SOLN
INTRAMUSCULAR | Status: DC | PRN
  Administered 2024-02-26: .2 mg via INTRAVENOUS

## 2024-02-26 NOTE — Anesthesia Preprocedure Evaluation (Signed)
 Anesthesia Evaluation  Patient identified by MRN, date of birth, ID band Patient awake    Reviewed: Allergy & Precautions, H&P , NPO status , Patient's Chart, lab work & pertinent test results, reviewed documented beta blocker date and time   Airway Mallampati: II   Neck ROM: full    Dental  (+) Poor Dentition   Pulmonary shortness of breath and with exertion, pneumonia, resolved, COPD,  COPD inhaler, former smoker   Pulmonary exam normal        Cardiovascular Exercise Tolerance: Poor hypertension, On Medications + CAD, + Past MI and + Peripheral Vascular Disease  Normal cardiovascular exam Rhythm:regular Rate:Normal     Neuro/Psych  PSYCHIATRIC DISORDERS Anxiety Depression     Neuromuscular disease    GI/Hepatic Neg liver ROS,GERD  ,,  Endo/Other  Hypothyroidism    Renal/GU Renal disease  negative genitourinary   Musculoskeletal   Abdominal   Peds  Hematology  (+) Blood dyscrasia, anemia   Anesthesia Other Findings Past Medical History: No date: Adenocarcinoma of left lung, stage 3 (HCC) No date: Adrenal insufficiency No date: Anxiety No date: Aortic atherosclerosis No date: Arthritis     Comment:  SHOULDER No date: Ascending aortic aneurysm No date: BPH (benign prostatic hyperplasia) No date: Cataract No date: Chronic kidney disease     Comment:  low kidney function No date: Colon adenomas 2015: Colon cancer (HCC)     Comment:  Pt states during his colonoscopy he had cancerous polyps              removed.  No date: COPD (chronic obstructive pulmonary disease) (HCC) No date: Coronary artery disease No date: Degeneration of intervertebral disc of lumbar region with  discogenic back pain and lower extremity pain No date: Depression     Comment:  SINCE DIAGNOSIS No date: Dyspnea     Comment:  DOE No date: Enlarged prostate No date: GERD (gastroesophageal reflux disease) No date: History of prostate  cancer No date: Hypertension No date: Hypothyroidism No date: Iron  deficiency anemia No date: Lung cancer [facility])     Comment:  Aug 2018 No date: Monoclonal gammopathy of unknown significance No date: Myocardial infarction (HCC) No date: Pain     Comment:  DUE TO LUNG ISSUE No date: Peripheral vascular disease No date: Pneumonia No date: Pulmonary emphysema (HCC) No date: Thoracic compression fracture (HCC) Past Surgical History: No date: BACK SURGERY     Comment:  kyphoplasty  T5-6 No date: CATARACT EXTRACTION W/ INTRAOCULAR LENS  IMPLANT, BILATERAL No date: cervical lymph node biopsy; N/A No date: COLONOSCOPY 02/05/2024: COLONOSCOPY; N/A     Comment:  Procedure: COLONOSCOPY;  Surgeon: Toledo, Ladell POUR, MD;              Location: [facility] ENDOSCOPY;  Service: Gastroenterology;                Laterality: N/A; 12/17/2019: COLONOSCOPY WITH PROPOFOL ; N/A     Comment:  Procedure: COLONOSCOPY WITH PROPOFOL ;  Surgeon: Dessa Reyes ORN, MD;  Location: [facility] ENDOSCOPY;  Service:               Endoscopy;  Laterality: N/A; No date: CORONARY ANGIOPLASTY 12/29/2022: CORONARY STENT INTERVENTION; N/A     Comment:  Procedure: CORONARY STENT INTERVENTION;  Surgeon:               Ammon Blunt, MD;  Location: [facility] INVASIVE CV  LAB;  Service: Cardiovascular;  Laterality: N/A; 02/05/2024: ESOPHAGOGASTRODUODENOSCOPY; N/A     Comment:  Procedure: EGD (ESOPHAGOGASTRODUODENOSCOPY);  Surgeon:               Toledo, Ladell POUR, MD;  Location: [facility] ENDOSCOPY;                Service: Gastroenterology;  Laterality: N/A; No date: EYE SURGERY; Bilateral     Comment:  cataract No date: KYPHOPLASTY; N/A 12/29/2022: LEFT HEART CATH AND CORONARY ANGIOGRAPHY; N/A     Comment:  Procedure: LEFT HEART CATH AND CORONARY ANGIOGRAPHY;                Surgeon: Ammon Blunt, MD;  Location: [facility]               INVASIVE CV LAB;  Service: Cardiovascular;  Laterality:                N/A; 02/09/2017: LYMPH GLAND EXCISION; N/A     Comment:  Procedure: CERVICAL LYMPH NODE BIOPSY;  Surgeon:               Dessa Reyes ORN, MD;  Location: [facility] ORS;  Service:               General;  Laterality: N/A; No date: PORT-A-CATH REMOVAL; N/A 02/23/2017: PORTACATH PLACEMENT; Right     Comment:  Procedure: INSERTION PORT-A-CATH;  Surgeon: Dessa Reyes ORN, MD;  Location: [facility] ORS;  Service: General;                Laterality: Right; No date: PROSTATE SURGERY No date: TONSILLECTOMY 02/14/2019: TRANSURETHRAL RESECTION OF PROSTATE; N/A     Comment:  Procedure: TRANSURETHRAL RESECTION OF THE PROSTATE               (TURP);  Surgeon: Francisca Redell BROCKS, MD;  Location: [facility]               ORS;  Service: Urology;  Laterality: N/A;   Reproductive/Obstetrics negative OB ROS                              Anesthesia Physical Anesthesia Plan  ASA: 3  Anesthesia Plan: General   Post-op Pain Management:    Induction:   PONV Risk Score and Plan:   Airway Management Planned:   Additional Equipment:   Intra-op Plan:   Post-operative Plan:   Informed Consent: I have reviewed the patients History and Physical, chart, labs and discussed the procedure including the risks, benefits and alternatives for the proposed anesthesia with the patient or authorized representative who has indicated his/her understanding and acceptance.     Dental Advisory Given  Plan Discussed with: CRNA  Anesthesia Plan Comments:         Anesthesia Quick Evaluation

## 2024-02-26 NOTE — Op Note (Signed)
 [facility] Gastroenterology Patient Name: Marc Gray Procedure Date: 02/26/2024 10:03 AM MRN: [id number] Account #: [id number] Date of Birth: 1945/07/06 Admit Type: Outpatient Age: 78 Room: [facility] ENDO ROOM 3 Gender: Male Note Status: Finalized Instrument Name: Colon Scope [id number] Procedure:             Colonoscopy Indications:           High risk colon cancer surveillance: Personal history                         of multiple (3 or more) adenomas, Unexplained Iron                          deficiency anemia Providers:             Adonay Scheier K. Aundria MD, MD Referring MD:          Lynwood FALCON. Valora, MD (Referring MD) Medicines:             Propofol  per Anesthesia Complications:         No immediate complications. Estimated blood loss:                         Minimal. Procedure:             Pre-Anesthesia Assessment:                        - The risks and benefits of the procedure and the                         sedation options and risks were discussed with the                         patient. All questions were answered and informed                         consent was obtained.                        - Patient identification and proposed procedure were                         verified prior to the procedure by the nurse. The                         procedure was verified in the procedure room.                        - ASA Grade Assessment: III - A patient with severe                         systemic disease.                        - After reviewing the risks and benefits, the patient                         was deemed in satisfactory condition to undergo the  procedure.                        After obtaining informed consent, the colonoscope was                         passed under direct vision. Throughout the procedure,                         the patient's blood pressure, pulse, and oxygen                         saturations were  monitored continuously. The                         Colonoscope was introduced through the anus and                         advanced to the the cecum, identified by appendiceal                         orifice and ileocecal valve. The colonoscopy was                         technically difficult and complex due to a redundant                         colon and significant looping. Successful completion                         of the procedure was aided by applying abdominal                         pressure. The patient tolerated the procedure well.                         The quality of the bowel preparation was adequate. The                         ileocecal valve, appendiceal orifice, and rectum were                         photographed. Findings:      The perianal and digital rectal examinations were normal. Pertinent       negatives include normal sphincter tone and no palpable rectal lesions.      Non-bleeding internal hemorrhoids were found during retroflexion. The       hemorrhoids were Grade I (internal hemorrhoids that do not prolapse).      A 5 mm polyp was found in the cecum. The polyp was sessile. The polyp       was removed with a jumbo cold forceps. Resection and retrieval were       complete. Estimated blood loss was minimal.      Four sessile polyps were found in the proximal ascending colon. The       polyps were 7 to 12 mm in size. These polyps were removed with a hot       snare. Resection and retrieval were complete. Estimated blood loss: none.      A 17  mm polyp was found in the mid ascending colon. The polyp was       sessile. The polyp was removed with a hot snare. Resection and retrieval       were complete. Estimated blood loss was minimal. To prevent bleeding       after the polypectomy, one hemostatic clip was successfully placed (MR       conditional). Clip manufacturer: [employer]. There was no       bleeding during, or at the end, of the procedure.  Estimated blood loss       was minimal.      A 12 mm polyp was found in the hepatic flexure. The polyp was sessile.       The polyp was removed with a hot snare. Resection and retrieval were       complete. Estimated blood loss was minimal. To prevent bleeding after       the polypectomy, one hemostatic clip was successfully placed (MR       conditional). Clip manufacturer: [employer]. There was no       bleeding at the end of the procedure. Estimated blood loss was minimal.      Four sessile polyps were found in the hepatic flexure. The polyps were 7       to 10 mm in size. These polyps were removed with a hot snare. Resection       and retrieval were complete. Estimated blood loss: none.      Three sessile polyps were found in the proximal transverse colon. The       polyps were 8 to 12 mm in size. These polyps were removed with a hot       snare. Resection and retrieval were complete. Estimated blood loss: none.      A 10 mm polyp was found in the splenic flexure. The polyp was sessile.       The polyp was removed with a hot snare. Resection and retrieval were       complete. Estimated blood loss: none.      The exam was otherwise without abnormality. Impression:            - Non-bleeding internal hemorrhoids.                        - One 5 mm polyp in the cecum, removed with a jumbo                         cold forceps. Resected and retrieved.                        - Four 7 to 12 mm polyps in the proximal ascending                         colon, removed with a hot snare. Resected and                         retrieved.                        - One 17 mm polyp in the mid ascending colon, removed                         with a hot snare.  Resected and retrieved. Clip (MR                         conditional) was placed. Clip manufacturer: [employer].                        - One 12 mm polyp at the hepatic flexure, removed with                          a hot snare. Resected and retrieved. Clip (MR                         conditional) was placed. Clip manufacturer: [employer].                        - Four 7 to 10 mm polyps at the hepatic flexure,                         removed with a hot snare. Resected and retrieved.                        - Three 8 to 12 mm polyps in the proximal transverse                         colon, removed with a hot snare. Resected and                         retrieved.                        - One 10 mm polyp at the splenic flexure, removed with                         a hot snare. Resected and retrieved.                        - The examination was otherwise normal. Recommendation:        - Patient has a contact number available for                         emergencies. The signs and symptoms of potential                         delayed complications were discussed with the patient.                         Return to normal activities tomorrow. Written                         discharge instructions were provided to the patient.                        - Resume previous diet.                        -  Continue present medications.                        - Repeat colonoscopy in 1 year for surveillance.                        - Return to GI office PRN.                        - The findings and recommendations were discussed with                         the patient. Procedure Code(s):     --- Professional ---                        [phone], Colonoscopy, flexible; with removal of                         tumor(s), polyp(s), or other lesion(s) by snare                         technique                        [id number], 59, Colonoscopy, flexible; with biopsy, single                         or multiple Diagnosis Code(s):     --- Professional ---                        K64.0, First degree hemorrhoids                        D12.3, Benign neoplasm of transverse colon (hepatic                          flexure or splenic flexure)                        D12.0, Benign neoplasm of cecum                        D12.2, Benign neoplasm of ascending colon                        Z86.010, Personal history of colonic polyps CPT copyright 2022 American [employer]. All rights reserved. The codes documented in this report are preliminary and upon coder review may  be revised to meet current compliance requirements. Ladell MARLA Boss MD, MD 02/26/2024 11:16:10 AM This report has been signed electronically. Number of Addenda: 0 Note Initiated On: 02/26/2024 10:03 AM Scope Withdrawal Time: 0 hours 27 minutes 43 seconds  Total Procedure Duration: 0 hours 33 minutes 16 seconds  Estimated Blood Loss:  Estimated blood loss was minimal. Estimated blood loss                         was minimal.      [facility]

## 2024-02-26 NOTE — Op Note (Addendum)
 [facility] Gastroenterology Patient Name: Marc Gray Procedure Date: 02/26/2024 10:05 AM MRN: [id number] Account #: [id number] Date of Birth: Jan 20, 1946 Admit Type: Outpatient Age: 78 Room: [facility] ENDO ROOM 3 Gender: Male Note Status: Supervisor Override Instrument Name: Barnie GI Scope [id number] Procedure:             Upper GI endoscopy Indications:           Iron  deficiency anemia secondary to chronic blood                         loss, Gastro-esophageal reflux disease Providers:             Shavaun Osterloh K. Aundria MD, MD Referring MD:          Lynwood FALCON. Valora, MD (Referring MD) Medicines:             Propofol  per Anesthesia Complications:         No immediate complications. Estimated blood loss:                         Minimal. Procedure:             Pre-Anesthesia Assessment:                        - The risks and benefits of the procedure and the                         sedation options and risks were discussed with the                         patient. All questions were answered and informed                         consent was obtained.                        - Patient identification and proposed procedure were                         verified prior to the procedure by the nurse. The                         procedure was verified in the procedure room.                        - ASA Grade Assessment: III - A patient with severe                         systemic disease.                        - After reviewing the risks and benefits, the patient                         was deemed in satisfactory condition to undergo the                         procedure.  After obtaining informed consent, the endoscope was                         passed under direct vision. Throughout the procedure,                         the patient's blood pressure, pulse, and oxygen                         saturations were monitored continuously. The Endoscope                          was introduced through the mouth, and advanced to the                         third part of duodenum. The upper GI endoscopy was                         accomplished without difficulty. The patient tolerated                         the procedure well. Findings:      Patchy, white plaques were found in the entire esophagus. Cells for       cytology were obtained by brushing. Estimated blood loss: none.      Scattered mild inflammation characterized by erosions was found in the       gastric body and in the gastric antrum. Biopsies were taken with a cold       forceps for Helicobacter pylori testing.      The exam of the stomach was otherwise normal.      Two tongues of salmon-colored mucosa were present from 34 to 35 cm. No       other visible abnormalities were present. The maximum longitudinal       extent of these esophageal mucosal changes was 1 cm in length. Mucosa       was biopsied with a cold forceps for histology. One specimen bottle was       sent to pathology. Estimated blood loss was minimal.      The exam was otherwise without abnormality.      The examined duodenum was normal. Impression:            - Esophageal plaques were found, consistent with                         candidiasis. Cells for cytology obtained.                        - Gastritis. Biopsied.                        - Salmon-colored mucosa suspicious for short-segment                         Barrett's esophagus. Biopsied.                        - The examination was otherwise normal.                        - Normal  examined duodenum. Recommendation:        - Await pathology results.                        - Proceed with colonoscopy Procedure Code(s):     --- Professional ---                        [phone], Esophagogastroduodenoscopy, flexible,                         transoral; with biopsy, single or multiple Diagnosis Code(s):     --- Professional ---                        K21.9,  Gastro-esophageal reflux disease without                         esophagitis                        D50.0, Iron  deficiency anemia secondary to blood loss                         (chronic)                        K22.89, Other specified disease of esophagus                        K29.70, Gastritis, unspecified, without bleeding                        K22.9, Disease of esophagus, unspecified CPT copyright 2022 American Medical Association. All rights reserved. The codes documented in this report are preliminary and upon coder review may  be revised to meet current compliance requirements. Ladell MARLA Boss MD, MD 02/26/2024 10:30:18 AM This report has been signed electronically. Number of Addenda: 0 Note Initiated On: 02/26/2024 10:05 AM Estimated Blood Loss:  Estimated blood loss was minimal. Estimated blood loss                         was minimal. Estimated blood loss was minimal.      [facility]

## 2024-02-26 NOTE — Transfer of Care (Signed)
 Immediate Anesthesia Transfer of Care Note  Patient: Marc Gray  Procedure(s) Performed: COLONOSCOPY EGD (ESOPHAGOGASTRODUODENOSCOPY)  Patient Location: PACU  Anesthesia Type:General  Level of Consciousness: sedated  Airway & Oxygen Therapy: Patient Spontanous Breathing  Post-op Assessment: Report given to RN and Post -op Vital signs reviewed and stable  Post vital signs: Reviewed and stable  Last Vitals:  Vitals Value Taken Time  BP 84/60 02/26/24 11:11  Temp    Pulse 65 02/26/24 11:11  Resp 16 02/26/24 11:11  SpO2 100 % 02/26/24 11:11  Vitals shown include unfiled device data.  Last Pain:  Vitals:   02/26/24 0925  TempSrc: Temporal  PainSc: 0-No pain         Complications: No notable events documented.

## 2024-02-26 NOTE — Anesthesia Postprocedure Evaluation (Signed)
 Anesthesia Post Note  Patient: Marc Gray  Procedure(s) Performed: COLONOSCOPY EGD (ESOPHAGOGASTRODUODENOSCOPY)  Patient location during evaluation: PACU Anesthesia Type: General Level of consciousness: awake and alert Pain management: pain level controlled Vital Signs Assessment: post-procedure vital signs reviewed and stable Respiratory status: spontaneous breathing, nonlabored ventilation, respiratory function stable and patient connected to nasal cannula oxygen Cardiovascular status: blood pressure returned to baseline and stable Postop Assessment: no apparent nausea or vomiting Anesthetic complications: no   No notable events documented.   Last Vitals:  Vitals:   02/26/24 1120 02/26/24 1130  BP: 107/77 125/74  Pulse:    Resp: 19 17  Temp:    SpO2: 98% 98%    Last Pain:  Vitals:   02/26/24 1130  TempSrc:   PainSc: 0-No pain                 Marc Gray

## 2024-02-27 LAB — SURGICAL PATHOLOGY

## 2024-02-27 NOTE — H&P (Signed)
 Outpatient short stay form Pre-procedure 02/27/2024 10:02 AM Marc Gray K. Aundria, M.D.  Primary Physician: Lynwood Null, M.D.  Reason for visit:   Iron  deficiency anemia due to chronic blood loss  Chronic constipation  Hx of adenomatous colonic polyps  GERD without esophagitis   History of present illness:  Marc Gray presents to the [facility] for follow-up of iron -deficiency anemia. He presents to the clinic by himself. He reports overall he is doing fine. He has noticed issues with constipation. He is taking stool softener twice daily and reports he continues to have significant straining with bowel movement. He has noticed bright red blood on tissue paper after wiping, but no blood intermixed in his stool or in the toilet bowl. He is trying to have a BM once every morning. He denies any complaints of abdominal pain or abdominal cramping. Appetite and diet are stable without any unintentional weight loss. Weight loss is challenging due to being on chronic prednisone . He did have hospital admission in February 2025 due to sepsis 2/2 pneumonia. He follows closely in Pulmonology with Dr. Aleskerov for asthma-COPD overlap and eosinophilic asthma. He reports breathing at baseline is still somewhat difficult. Breathing is worsened by bending over or anxiety. He is on Dupixent injections and multiple inhalers. He had 2 IV iron  infusions in March with Dr. Babara. Labs in Feb 2025 showed stable hemoglobin 10.6 with ongoing evidence of iron -deficiency with reduced serum iron  20 and iron  sat 6%. There was no significant difference to how he felt before versus after iron  infusions. He remains on dual antiplatelet therapy with ASA 81 mg and Effient . No issues with GI bleeding since being on antiplatelet regimen. He continues to follow closely with Dr. Ammon. He is s/p left heart cath 12/29/22 with Dr. Ammon for indications of NSTEMI with one stent placed to LAD after finding 90% blockage. He reports  he is really wanting to get a colonoscopy scheduled. Last colonoscopy performed by Dr. Dessa 11/2019 removed eight subcentimeter tubular adenomas from colon with recommendation to have 3-year repeat. GERD symptoms well-controlled on once daily PPI. He denies any concerning UGI symptoms such as esophageal dysphagia, odynophagia, early satiety, hoarseness, nausea, or vomiting. No other questions or concerns offered at this time.    No current facility-administered medications for this encounter.  Current Outpatient Medications:    acetaminophen  (TYLENOL ) 650 MG CR tablet, Take 1,300 mg by mouth every 8 (eight) hours as needed for pain., Disp: , Rfl:    aspirin  EC 81 MG tablet, Take 1 tablet (81 mg total) by mouth daily. Swallow whole., Disp: 90 tablet, Rfl: 0   Azelastine HCl 137 MCG/SPRAY SOLN, SMARTSIG:1-2 Spray(s) Both Nares Twice Daily, Disp: , Rfl:    BREZTRI AEROSPHERE 160-9-4.8 MCG/ACT AERO, Inhale 2 puffs into the lungs 2 (two) times daily., Disp: , Rfl:    calcium -vitamin D  (OSCAL WITH D) 500-200 MG-UNIT tablet, Take 1 tablet by mouth daily., Disp: 90 tablet, Rfl: 1   DUPIXENT 300 MG/2ML SOPN, Inject 300 mg into the skin every 14 (fourteen) days., Disp: , Rfl:    furosemide  (LASIX ) 20 MG tablet, Take 20 mg by mouth daily., Disp: , Rfl:    Ipratropium-Albuterol  (COMBIVENT) 20-100 MCG/ACT AERS respimat, Inhale 1 puff into the lungs every 6 (six) hours as needed., Disp: , Rfl:    levothyroxine  (SYNTHROID ) 137 MCG tablet, Take 137 mcg by mouth daily before breakfast., Disp: , Rfl:    Melatonin 10 MG TABS, Take 10 mg by mouth at bedtime. ,  Disp: , Rfl:    metoprolol  tartrate (LOPRESSOR ) 25 MG tablet, Take 0.5 tablets (12.5 mg total) by mouth 2 (two) times daily., Disp: 30 tablet, Rfl: 2   montelukast  (SINGULAIR ) 10 MG tablet, Take 10 mg by mouth daily., Disp: , Rfl:    Multiple Vitamin (MULTI-VITAMINS) TABS, Take 1 tablet by mouth daily. , Disp: , Rfl:    pantoprazole  (PROTONIX ) 40 MG tablet,  Take 1 tablet (40 mg total) by mouth daily., Disp: 90 tablet, Rfl: 0   predniSONE  (DELTASONE ) 5 MG tablet, TAKE TWO TABLETS BY MOUTH IN THE MORNING AND TAKE ONE-HALF Tablets  BY MOUTH IN THE AFTERNOON, Disp: , Rfl:    rosuvastatin  (CRESTOR ) 10 MG tablet, Take 10 mg by mouth daily., Disp: , Rfl:    tamsulosin  (FLOMAX ) 0.4 MG CAPS capsule, Take 1 capsule (0.4 mg total) by mouth daily., Disp: 90 capsule, Rfl: 3   traZODone  (DESYREL ) 100 MG tablet, Take 100 mg by mouth at bedtime., Disp: , Rfl:    amphetamine -dextroamphetamine  (ADDERALL) 20 MG tablet, Take by mouth., Disp: , Rfl:    benzonatate  (TESSALON ) 200 MG capsule, Take 1 capsule (200 mg total) by mouth 3 (three) times daily as needed for cough. (Patient not taking: Reported on 12/04/2023), Disp: 20 capsule, Rfl: 0   EPINEPHrine 0.3 mg/0.3 mL IJ SOAJ injection, Inject 0.3 mg into the muscle as needed., Disp: , Rfl:    fluticasone  (FLONASE ) 50 MCG/ACT nasal spray, Place 1 spray into both nostrils daily., Disp: 16 g, Rfl: 2   gabapentin  (NEURONTIN ) 100 MG capsule, Take 1 capsule (100 mg total) by mouth 3 (three) times daily., Disp: 90 capsule, Rfl: 2   magnesium oxide (MAG-OX) 400 (240 Mg) MG tablet, Take 400 mg by mouth daily., Disp: , Rfl:    tadalafil  (CIALIS ) 10 MG tablet, Take 1-2 tablets (10-20 mg total) by mouth daily as needed for erectile dysfunction. Take 5-10mg  by mouth as needed prior to intercourse, Disp: 30 tablet, Rfl: 11   traMADol  (ULTRAM ) 50 MG tablet, Take 50 mg by mouth every 6 (six) hours as needed for moderate pain (pain score 4-6). If he knows that he is going to be on his feet he will take a tablet to combat the pain, Disp: , Rfl:   Facility-Administered Medications Ordered in Other Encounters:    sodium chloride  flush (NS) 0.9 % injection 10 mL, 10 mL, Intravenous, PRN, Babara Call, MD, 10 mL at 04/03/17 0833  No medications prior to admission.     Allergies  Allergen Reactions   Morphine  And Codeine      Past Medical  History:  Diagnosis Date   Adenocarcinoma of left lung, stage 3 (HCC)    Adrenal insufficiency    Anxiety    Aortic atherosclerosis    Arthritis    SHOULDER   Ascending aortic aneurysm    BPH (benign prostatic hyperplasia)    Cataract    Chronic kidney disease    low kidney function   Colon adenomas    Colon cancer (HCC) 2015   Pt states during his colonoscopy he had cancerous polyps removed.    COPD (chronic obstructive pulmonary disease) (HCC)    Coronary artery disease    Degeneration of intervertebral disc of lumbar region with discogenic back pain and lower extremity pain    Depression    SINCE DIAGNOSIS   Dyspnea    DOE   Enlarged prostate    GERD (gastroesophageal reflux disease)    History of prostate cancer  Hypertension    Hypothyroidism    Iron  deficiency anemia    Lung cancer [facility])    Aug 2018   Monoclonal gammopathy of unknown significance    Myocardial infarction (HCC)    Pain    DUE TO LUNG ISSUE   Peripheral vascular disease    Pneumonia    Pulmonary emphysema (HCC)    Thoracic compression fracture (HCC)     Review of systems:  Otherwise negative.    Physical Exam  Gen: Alert, oriented. Appears stated age.  HEENT: [place]/AT. PERRLA. Lungs: CTA, no wheezes. CV: RR nl S1, S2. Abd: soft, benign, no masses. BS+ Ext: No edema. Pulses 2+    Planned procedures: Proceed with EGD and colonoscopy. The patient understands the nature of the planned procedure, indications, risks, alternatives and potential complications including but not limited to bleeding, infection, perforation, damage to internal organs and possible oversedation/side effects from anesthesia. The patient agrees and gives consent to proceed.  Please refer to procedure notes for findings, recommendations and patient disposition/instructions.     Skyelar Swigart K. Aundria, M.D. Gastroenterology 02/27/2024  10:02 AM

## 2024-02-27 NOTE — Interval H&P Note (Signed)
 History and Physical Interval Note:  02/27/2024 10:04 AM  Marc Gray  has presented today for surgery, with the diagnosis of Iron  deficiency anemia due to chronic blood loss [D50.0]   Chronic constipation [K59.09] Hx of adenomatous colonic polyps [Z86.0101] GERD without esophagitis [K21.9].  The various methods of treatment have been discussed with the patient and family. After consideration of risks, benefits and other options for treatment, the patient has consented to  Procedure(s) with comments: COLONOSCOPY (N/A) - Effient  EGD (ESOPHAGOGASTRODUODENOSCOPY) (N/A) POLYPECTOMY, INTESTINE CONTROL OF HEMORRHAGE, GI TRACT, ENDOSCOPIC, BY CLIPPING OR OVERSEWING ESOPHAGOSCOPY, WITH BRUSH BIOPSY as a surgical intervention.  The patient's history has been reviewed, patient examined, no change in status, stable for surgery.  I have reviewed the patient's chart and labs.  Questions were answered to the patient's satisfaction.     [name], [name]

## 2024-03-04 DIAGNOSIS — R110 Nausea: Secondary | ICD-10-CM | POA: Diagnosis not present

## 2024-03-04 DIAGNOSIS — N1831 Chronic kidney disease, stage 3a: Secondary | ICD-10-CM | POA: Diagnosis not present

## 2024-03-04 DIAGNOSIS — I10 Essential (primary) hypertension: Secondary | ICD-10-CM | POA: Diagnosis not present

## 2024-03-04 DIAGNOSIS — E032 Hypothyroidism due to medicaments and other exogenous substances: Secondary | ICD-10-CM | POA: Diagnosis not present

## 2024-03-04 DIAGNOSIS — R413 Other amnesia: Secondary | ICD-10-CM | POA: Diagnosis not present

## 2024-03-05 ENCOUNTER — Ambulatory Visit
Admission: RE | Admit: 2024-03-05 | Discharge: 2024-03-05 | Disposition: A | Discharge status: Home / Self Care | Source: Ambulatory Visit | Attending: Oncology | Admitting: Oncology

## 2024-03-05 ENCOUNTER — Other Ambulatory Visit

## 2024-03-05 ENCOUNTER — Inpatient Hospital Stay: Attending: Oncology

## 2024-03-05 DIAGNOSIS — E2740 Unspecified adrenocortical insufficiency: Secondary | ICD-10-CM | POA: Insufficient documentation

## 2024-03-05 DIAGNOSIS — Z85118 Personal history of other malignant neoplasm of bronchus and lung: Secondary | ICD-10-CM | POA: Insufficient documentation

## 2024-03-05 DIAGNOSIS — D472 Monoclonal gammopathy: Secondary | ICD-10-CM | POA: Insufficient documentation

## 2024-03-05 DIAGNOSIS — D509 Iron deficiency anemia, unspecified: Secondary | ICD-10-CM | POA: Insufficient documentation

## 2024-03-05 DIAGNOSIS — C3492 Malignant neoplasm of unspecified part of left bronchus or lung: Secondary | ICD-10-CM | POA: Diagnosis not present

## 2024-03-05 DIAGNOSIS — J432 Centrilobular emphysema: Secondary | ICD-10-CM | POA: Diagnosis not present

## 2024-03-05 DIAGNOSIS — J181 Lobar pneumonia, unspecified organism: Secondary | ICD-10-CM | POA: Diagnosis not present

## 2024-03-05 DIAGNOSIS — C3490 Malignant neoplasm of unspecified part of unspecified bronchus or lung: Secondary | ICD-10-CM | POA: Diagnosis not present

## 2024-03-05 LAB — CBC WITH DIFFERENTIAL (CANCER CENTER ONLY)
Abs Immature Granulocytes: 0.05 K/uL (ref 0.00–0.07)
Basophils Absolute: 0.1 K/uL (ref 0.0–0.1)
Basophils Relative: 1 %
Eosinophils Absolute: 0.1 K/uL (ref 0.0–0.5)
Eosinophils Relative: 1 %
HCT: 39.2 % (ref 39.0–52.0)
Hemoglobin: 12.6 g/dL — ABNORMAL LOW (ref 13.0–17.0)
Immature Granulocytes: 1 %
Lymphocytes Relative: 9 %
Lymphs Abs: 0.7 K/uL (ref 0.7–4.0)
MCH: 29.4 pg (ref 26.0–34.0)
MCHC: 32.1 g/dL (ref 30.0–36.0)
MCV: 91.4 fL (ref 80.0–100.0)
Monocytes Absolute: 0.7 K/uL (ref 0.1–1.0)
Monocytes Relative: 8 %
Neutro Abs: 6.6 K/uL (ref 1.7–7.7)
Neutrophils Relative %: 80 %
Platelet Count: 166 K/uL (ref 150–400)
RBC: 4.29 MIL/uL (ref 4.22–5.81)
RDW: 17.9 % — ABNORMAL HIGH (ref 11.5–15.5)
WBC Count: 8.2 K/uL (ref 4.0–10.5)
nRBC: 0 % (ref 0.0–0.2)

## 2024-03-05 LAB — IRON AND TIBC
Iron: 45 ug/dL (ref 45–182)
Saturation Ratios: 12 % — ABNORMAL LOW (ref 17.9–39.5)
TIBC: 386 ug/dL (ref 250–450)
UIBC: 341 ug/dL

## 2024-03-05 LAB — CMP (CANCER CENTER ONLY)
ALT: 19 U/L (ref 0–44)
AST: 24 U/L (ref 15–41)
Albumin: 3.8 g/dL (ref 3.5–5.0)
Alkaline Phosphatase: 42 U/L (ref 38–126)
Anion gap: 7 (ref 5–15)
BUN: 32 mg/dL — ABNORMAL HIGH (ref 8–23)
CO2: 24 mmol/L (ref 22–32)
Calcium: 9.1 mg/dL (ref 8.9–10.3)
Chloride: 104 mmol/L (ref 98–111)
Creatinine: 1.29 mg/dL — ABNORMAL HIGH (ref 0.61–1.24)
GFR, Estimated: 57 mL/min — ABNORMAL LOW (ref >60)
Glucose, Bld: 107 mg/dL — ABNORMAL HIGH (ref 70–99)
Potassium: 4 mmol/L (ref 3.5–5.1)
Sodium: 135 mmol/L (ref 135–145)
Total Bilirubin: 0.6 mg/dL (ref 0.0–1.2)
Total Protein: 6.6 g/dL (ref 6.5–8.1)

## 2024-03-05 LAB — FERRITIN: Ferritin: 16 ng/mL — ABNORMAL LOW (ref 24–336)

## 2024-03-10 DIAGNOSIS — J4540 Moderate persistent asthma, uncomplicated: Secondary | ICD-10-CM | POA: Diagnosis not present

## 2024-03-10 DIAGNOSIS — J8283 Eosinophilic asthma: Secondary | ICD-10-CM | POA: Diagnosis not present

## 2024-03-11 ENCOUNTER — Ambulatory Visit: Admitting: Oncology

## 2024-03-11 ENCOUNTER — Inpatient Hospital Stay: Admitting: Oncology

## 2024-03-11 ENCOUNTER — Inpatient Hospital Stay

## 2024-03-11 ENCOUNTER — Ambulatory Visit

## 2024-03-11 ENCOUNTER — Encounter: Payer: Self-pay | Admitting: Oncology

## 2024-03-11 VITALS — BP 128/77 | HR 87 | Temp 97.6°F | Resp 18

## 2024-03-11 VITALS — BP 122/73 | HR 74 | Resp 18

## 2024-03-11 DIAGNOSIS — E860 Dehydration: Secondary | ICD-10-CM

## 2024-03-11 DIAGNOSIS — C3492 Malignant neoplasm of unspecified part of left bronchus or lung: Secondary | ICD-10-CM | POA: Diagnosis not present

## 2024-03-11 DIAGNOSIS — E2740 Unspecified adrenocortical insufficiency: Secondary | ICD-10-CM | POA: Diagnosis not present

## 2024-03-11 DIAGNOSIS — D509 Iron deficiency anemia, unspecified: Secondary | ICD-10-CM

## 2024-03-11 DIAGNOSIS — D472 Monoclonal gammopathy: Secondary | ICD-10-CM | POA: Diagnosis not present

## 2024-03-11 MED ORDER — IRON SUCROSE 20 MG/ML IV SOLN
200.0000 mg | Freq: Once | INTRAVENOUS | Status: AC
  Administered 2024-03-11: 200 mg via INTRAVENOUS

## 2024-03-11 MED ORDER — SODIUM CHLORIDE 0.9% FLUSH
10.0000 mL | Freq: Once | INTRAVENOUS | Status: DC | PRN
  Filled 2024-03-11: qty 10

## 2024-03-11 NOTE — Progress Notes (Signed)
 Hematology/Oncology Progress note [phone] [phone]     Reason for Visit:  Follow up for lung cancer, iron  deficiency anemia MGUS   ASSESSMENT & PLAN:   Cancer Staging  Adenocarcinoma of left lung, stage 3 (HCC) Staging form: Lung, AJCC 8th Edition - Clinical stage from 02/14/2017: Stage IIIB (cT2b, cN3, cM0) - Signed by Babara Call, MD on 02/14/2017   Adenocarcinoma of left lung, stage 3 (HCC) #Stage IIIB lung adenocarcinoma, s/p concurrent chemo/RT and immunotherapy, finished all treatment in January 2020 Oct 2025 CT chest abdomen pelvis without contrast reviewed. No recurrence.  He is more than 5 year since he finishes treatment  Given his history of previous smoking history, recommend to continue CT annually   Adrenal insufficiency Continue prednisone  Follow up with endocrinology   IDA (iron  deficiency anemia) Lab Results  Component Value Date   HGB 12.6 (L) 03/05/2024   TIBC 386 03/05/2024   IRONPCTSAT 12 (L) 03/05/2024   FERRITIN 16 (L) 03/05/2024    Recommend IV venofer  weekly x 3 Etiology of IDA is unknown.  EGD and colonoscopy results reviewed.  Follow-up with GI  MGUS (monoclonal gammopathy of unknown significance) Lab Results  Component Value Date   MPROTEIN 0.2 (H) 11/27/2023   KPAFRELGTCHN 43.6 (H) 11/27/2023   LAMBDASER 29.5 (H) 11/27/2023   KAPLAMBRATIO 1.48 11/27/2023    Stable M protein Continue observation.   Orders Placed This Encounter  Procedures   CBC with Differential/Platelet    Standing Status:   Future    Expected Date:   09/09/2024    Expiration Date:   12/08/2024   Comprehensive metabolic panel with GFR    Standing Status:   Future    Expected Date:   09/09/2024    Expiration Date:   12/08/2024   Iron  and TIBC    Standing Status:   Future    Expected Date:   09/09/2024    Expiration Date:   12/08/2024   Ferritin    Standing Status:   Future    Expected Date:   09/09/2024    Expiration Date:   12/08/2024    Multiple Myeloma Panel (SPEP&IFE w/QIG)    Standing Status:   Future    Expected Date:   09/09/2024    Expiration Date:   12/08/2024   Kappa/lambda light chains    Standing Status:   Future    Expected Date:   09/09/2024    Expiration Date:   12/08/2024   Follow up in 6 months.   All questions were answered. The patient knows to call the clinic with any problems, questions or concerns.  Call Babara, MD, PhD [facility] Hematology Oncology 03/11/2024    HISTORY OF PRESENTING ILLNESS:  Marc Gray 78 y.o.  male who presents for treatment of Stage IIIB (cT2b, cN3, cM0)  Adenocarcinoma of lung.  # Patient is s/p carbo and taxol  concurrent RT for treatment of Stage IIIB Lung cancer. He recieved additional lung boost RT finished one year of Durvalumab  -January 2020  # Colon CA was listed in his history, however reviewing his surgical pathology from colonoscopy on 04/15/2014, during which he had polyps removed and all are negative for cancer.patient is scheduled for colonoscopy screening this year.  # 5/1/ 2019 CT scan was discussed at tumor board and changes consistent with radiation changes  # TURP procedure done on 02/14/2019.  Pathology showed prostate adenocarcinoma, Gleason score 3+3, tumor quantitation, estimated percentage of prostate tissue involved by tumor less than 5%.  No perineural invasion identified. Patient follows up with urologist and was recommended to observe.  03/07/2021, CT without contrast showed unchanged posttreatment/postradiation fibrosis and consolidation of the perihilar left lung and a paramedian left upper lobe.  Unchanged 4 mm nodules of the right lower lobe.  Almost certainly benign and incidental.  Emphysema.  Coronary artery disease.  Medi port was removed.   August 2024, hospitalization due to NSTEMI.  Patient is currently on aspirin  and Effient  Patient  takes prednisone  10 mg in a.m., 2.5 mg in p.m.  INTERVAL HISTORY Patient he presents for  follow-up of fo stage IIIB adenocarcinoma of the lung. Patient follows up with endocrinology for adrenal insufficiency and hypothyroidism.   He had EGD and colonoscopy done.+ Gastritis, esophagus candidiasis. No fever chills, SOB or cough.   Review of Systems  Constitutional:  Negative for appetite change, chills, fatigue, fever and unexpected weight change.  HENT:   Negative for hearing loss and voice change.   Eyes:  Negative for eye problems and icterus.  Respiratory:  Negative for chest tightness, cough and shortness of breath.   Cardiovascular:  Negative for chest pain and leg swelling.  Gastrointestinal:  Negative for abdominal distention, abdominal pain and blood in stool.  Endocrine: Negative for hot flashes.  Genitourinary:  Negative for difficulty urinating, dysuria and frequency.   Musculoskeletal:  Positive for arthralgias and back pain.  Skin:  Negative for itching and rash.  Neurological:  Negative for extremity weakness, light-headedness and numbness.  Hematological:  Negative for adenopathy. Does not bruise/bleed easily.  Psychiatric/Behavioral:  Positive for depression. Negative for confusion.     MEDICAL HISTORY:  Past Medical History:  Diagnosis Date   Adenocarcinoma of left lung, stage 3 (HCC)    Adrenal insufficiency    Anxiety    Aortic atherosclerosis    Arthritis    SHOULDER   Ascending aortic aneurysm    BPH (benign prostatic hyperplasia)    Cataract    Chronic kidney disease    low kidney function   Colon adenomas    Colon cancer (HCC) 2015   Pt states during his colonoscopy he had cancerous polyps removed.    COPD (chronic obstructive pulmonary disease) (HCC)    Coronary artery disease    Degeneration of intervertebral disc of lumbar region with discogenic back pain and lower extremity pain    Depression    SINCE DIAGNOSIS   Dyspnea    DOE   Enlarged prostate    GERD (gastroesophageal reflux disease)    History of prostate cancer     Hypertension    Hypothyroidism    Iron  deficiency anemia    Lung cancer (HCC)    Aug 2018   Monoclonal gammopathy of unknown significance    Myocardial infarction (HCC)    Pain    DUE TO LUNG ISSUE   Peripheral vascular disease    Pneumonia    Pulmonary emphysema (HCC)    Thoracic compression fracture (HCC)     SURGICAL HISTORY: Past Surgical History:  Procedure Laterality Date   BACK SURGERY     kyphoplasty  T5-6   CATARACT EXTRACTION W/ INTRAOCULAR LENS  IMPLANT, BILATERAL     cervical lymph node biopsy N/A    COLONOSCOPY     COLONOSCOPY N/A 02/05/2024   Procedure: COLONOSCOPY;  Surgeon: Toledo, Ladell POUR, MD;  Location: [facility] ENDOSCOPY;  Service: Gastroenterology;  Laterality: N/A;   COLONOSCOPY N/A 02/26/2024   Procedure: COLONOSCOPY;  Surgeon: Aundria, Teodoro K, MD;  Location: [facility]  ENDOSCOPY;  Service: Gastroenterology;  Laterality: N/A;  Effient    COLONOSCOPY WITH PROPOFOL  N/A 12/17/2019   Procedure: COLONOSCOPY WITH PROPOFOL ;  Surgeon: Dessa Reyes ORN, MD;  Location: [facility] ENDOSCOPY;  Service: Endoscopy;  Laterality: N/A;   CORONARY ANGIOPLASTY     CORONARY STENT INTERVENTION N/A 12/29/2022   Procedure: CORONARY STENT INTERVENTION;  Surgeon: Ammon Blunt, MD;  Location: [facility] INVASIVE CV LAB;  Service: Cardiovascular;  Laterality: N/A;   ESOPHAGEAL BRUSHING  02/26/2024   Procedure: ESOPHAGOSCOPY, WITH BRUSH BIOPSY;  Surgeon: Aundria, Ladell POUR, MD;  Location[facility] ENDOSCOPY;  Service: Gastroenterology;;   ESOPHAGOGASTRODUODENOSCOPY N/A 02/05/2024   Procedure: EGD (ESOPHAGOGASTRODUODENOSCOPY);  Surgeon: Toledo, Ladell POUR, MD;  Location: [facility] ENDOSCOPY;  Service: Gastroenterology;  Laterality: N/A;   ESOPHAGOGASTRODUODENOSCOPY N/A 02/26/2024   Procedure: EGD (ESOPHAGOGASTRODUODENOSCOPY);  Surgeon: Toledo, Ladell POUR, MD;  Location: [facility] ENDOSCOPY;  Service: Gastroenterology;  Laterality: N/A;   EYE SURGERY Bilateral    cataract   HEMOSTASIS CLIP PLACEMENT  02/26/2024    Procedure: CONTROL OF HEMORRHAGE, GI TRACT, ENDOSCOPIC, BY CLIPPING OR OVERSEWING;  Surgeon: Toledo, Ladell POUR, MD;  Location: [facility] ENDOSCOPY;  Service: Gastroenterology;;   KYPHOPLASTY N/A    LEFT HEART CATH AND CORONARY ANGIOGRAPHY N/A 12/29/2022   Procedure: LEFT HEART CATH AND CORONARY ANGIOGRAPHY;  Surgeon: Ammon Blunt, MD;  Location: [facility] INVASIVE CV LAB;  Service: Cardiovascular;  Laterality: N/A;   LYMPH GLAND EXCISION N/A 02/09/2017   Procedure: CERVICAL LYMPH NODE BIOPSY;  Surgeon: Dessa Reyes ORN, MD;  Location: [facility] ORS;  Service: General;  Laterality: N/A;   POLYPECTOMY  02/26/2024   Procedure: POLYPECTOMY, INTESTINE;  Surgeon: Aundria, Ladell POUR, MD;  Location: [facility] ENDOSCOPY;  Service: Gastroenterology;;   [facility] REMOVAL N/A    PORTACATH PLACEMENT Right 02/23/2017   Procedure: INSERTION PORT-A-CATH;  Surgeon: Dessa Reyes ORN, MD;  Location: [facility] ORS;  Service: General;  Laterality: Right;   PROSTATE SURGERY     TONSILLECTOMY     TRANSURETHRAL RESECTION OF PROSTATE N/A 02/14/2019   Procedure: TRANSURETHRAL RESECTION OF THE PROSTATE (TURP);  Surgeon: Francisca Redell BROCKS, MD;  Location: [facility] ORS;  Service: Urology;  Laterality: N/A;    SOCIAL HISTORY: Social History   Socioeconomic History   Marital status: Married    Spouse name: Not on file   Number of children: Not on file   Years of education: Not on file   Highest education level: Not on file  Occupational History   Not on file  Tobacco Use   Smoking status: Former    Current packs/day: 0.00    Average packs/day: 1 pack/day for 53.0 years (53.0 ttl pk-yrs)    Types: Cigarettes    Start date: 04/14/1964    Quit date: 04/14/2017    Years since quitting: 6.9    Passive exposure: Past   Smokeless tobacco: Never   Tobacco comments:    Quit November 2018  Vaping Use   Vaping status: Never Used  Substance and Sexual Activity   Alcohol use: Yes    Comment: occassional   Drug use: No   Sexual  activity: Yes  Other Topics Concern   Not on file  Social History Narrative   Not on file   Social Drivers of Health   Financial Resource Strain: Low Risk  (11/07/2023)   Received from [facility]   Overall Financial Resource Strain (CARDIA)    Difficulty of Paying Living Expenses: Not very hard  Food Insecurity: No Food Insecurity (11/07/2023)   Received from [facility]   Hunger Vital Sign    Within the past 12 months, you worried that your food would run out before you got the money to buy more.: Never true    Within the past 12 months, the food you bought just didn't last and you didn't have money to get more.: Never true  Transportation Needs: No Transportation Needs (11/07/2023)   Received from [facility] - Transportation    In the past 12 months, has lack of transportation kept you from medical appointments or from getting medications?: No    Lack of Transportation (Non-Medical): No  Physical Activity: Not on file  Stress: Not on file  Social Connections: Moderately Integrated (07/22/2023)   Social Connection and Isolation Panel    Frequency of Communication with Friends and Family: Three times a week    Frequency of Social Gatherings with Friends and Family: Twice a week    Attends Religious Services: 1 to 4 times per year    Active Member of [employer] or Organizations: No    Attends Banker Meetings: Never    Marital Status: Married  Catering manager Violence: Not At Risk (07/22/2023)   Humiliation, Afraid, Rape, and Kick questionnaire    Fear of Current or Ex-Partner: No    Emotionally Abused: No    Physically Abused: No    Sexually Abused: No    FAMILY HISTORY: Family History  Problem Relation Age of Onset   Breast cancer Maternal Grandmother    Dementia Mother    Diabetes Father    Stroke Father     ALLERGIES:  is allergic to morphine  and codeine.  MEDICATIONS:  Current Outpatient  Medications  Medication Sig Dispense Refill   acetaminophen  (TYLENOL ) 650 MG CR tablet Take 1,300 mg by mouth every 8 (eight) hours as needed for pain.     amphetamine -dextroamphetamine  (ADDERALL) 20 MG tablet Take by mouth.     aspirin  EC 81 MG tablet Take 1 tablet (81 mg total) by mouth daily. Swallow whole. 90 tablet 0   Azelastine HCl 137 MCG/SPRAY SOLN SMARTSIG:1-2 Spray(s) Both Nares Twice Daily     BREZTRI AEROSPHERE 160-9-4.8 MCG/ACT AERO Inhale 2 puffs into the lungs 2 (two) times daily.     calcium -vitamin D  (OSCAL WITH D) 500-200 MG-UNIT tablet Take 1 tablet by mouth daily. 90 tablet 1   DUPIXENT 300 MG/2ML SOPN Inject 300 mg into the skin every 14 (fourteen) days.     EPINEPHrine 0.3 mg/0.3 mL IJ SOAJ injection Inject 0.3 mg into the muscle as needed.     fluticasone  (FLONASE ) 50 MCG/ACT nasal spray Place 1 spray into both nostrils daily. 16 g 2   furosemide  (LASIX ) 20 MG tablet Take 20 mg by mouth daily.     gabapentin  (NEURONTIN ) 100 MG capsule Take 1 capsule (100 mg total) by mouth 3 (three) times daily. 90 capsule 2   Ipratropium-Albuterol  (COMBIVENT) 20-100 MCG/ACT AERS respimat Inhale 1 puff into the lungs every 6 (six) hours as needed.     levothyroxine  (SYNTHROID ) 137 MCG tablet Take 137 mcg by mouth daily before breakfast.     magnesium oxide (MAG-OX) 400 (240 Mg) MG tablet Take 400 mg by mouth daily.     Melatonin 10 MG TABS Take 10 mg by mouth at bedtime.      metoprolol  tartrate (LOPRESSOR ) 25 MG tablet Take 0.5 tablets (12.5 mg total) by mouth 2 (two) times daily. 30 tablet 2   montelukast  (SINGULAIR )  10 MG tablet Take 10 mg by mouth daily.     Multiple Vitamin (MULTI-VITAMINS) TABS Take 1 tablet by mouth daily.      pantoprazole  (PROTONIX ) 40 MG tablet Take 1 tablet (40 mg total) by mouth daily. 90 tablet 0   predniSONE  (DELTASONE ) 5 MG tablet TAKE TWO TABLETS BY MOUTH IN THE MORNING AND TAKE ONE-HALF Tablets  BY MOUTH IN THE AFTERNOON     rosuvastatin  (CRESTOR ) 10 MG  tablet Take 10 mg by mouth daily.     tadalafil  (CIALIS ) 10 MG tablet Take 1-2 tablets (10-20 mg total) by mouth daily as needed for erectile dysfunction. Take 5-10mg  by mouth as needed prior to intercourse 30 tablet 11   tamsulosin  (FLOMAX ) 0.4 MG CAPS capsule Take 1 capsule (0.4 mg total) by mouth daily. 90 capsule 3   traMADol  (ULTRAM ) 50 MG tablet Take 50 mg by mouth every 6 (six) hours as needed for moderate pain (pain score 4-6). If he knows that he is going to be on his feet he will take a tablet to combat the pain     traZODone  (DESYREL ) 100 MG tablet Take 100 mg by mouth at bedtime.     benzonatate  (TESSALON ) 200 MG capsule Take 1 capsule (200 mg total) by mouth 3 (three) times daily as needed for cough. (Patient not taking: Reported on 03/11/2024) 20 capsule 0   No current facility-administered medications for this visit.   Facility-Administered Medications Ordered in Other Visits  Medication Dose Route Frequency Provider Last Rate Last Admin   sodium chloride  flush (NS) 0.9 % injection 10 mL  10 mL Intravenous PRN Babara Call, MD   10 mL at 04/03/17 9166   sodium chloride  flush (NS) 0.9 % injection 10 mL  10 mL Intracatheter Once PRN Babara Call, MD          .  PHYSICAL EXAMINATION: ECOG PERFORMANCE STATUS: 1 - Symptomatic but completely ambulatory Vitals:   03/11/24 1520  BP: 128/77  Pulse: 87  Resp: 18  Temp: 97.6 F (36.4 C)  SpO2: 95%    There were no vitals filed for this visit. Physical Exam Constitutional:      General: He is not in acute distress.    Appearance: He is obese. He is not diaphoretic.  HENT:     Head: Normocephalic and atraumatic.  Eyes:     General: No scleral icterus. Cardiovascular:     Rate and Rhythm: Normal rate and regular rhythm.  Pulmonary:     Effort: Pulmonary effort is normal. No respiratory distress.  Abdominal:     General: Bowel sounds are normal. There is no distension.     Palpations: Abdomen is soft.  Musculoskeletal:         General: Normal range of motion.     Cervical back: Normal range of motion and neck supple.  Skin:    General: Skin is warm and dry.  Neurological:     Mental Status: He is alert and oriented to person, place, and time. Mental status is at baseline.     Motor: No abnormal muscle tone.  Psychiatric:        Mood and Affect: Affect normal.     Comments: Denies suicidal ideation.    LABORATORY DATA:  I have reviewed the data as listed    Latest Ref Rng & Units 03/05/2024    3:46 PM 11/27/2023    4:03 PM 07/22/2023    9:34 AM  CBC  WBC 4.0 - 10.5  K/uL 8.2  6.6  10.8   Hemoglobin 13.0 - 17.0 g/dL 87.3  89.9  88.3   Hematocrit 39.0 - 52.0 % 39.2  32.5  38.1   Platelets 150 - 400 K/uL 166  179  167       Latest Ref Rng & Units 03/05/2024    3:47 PM 07/22/2023    9:34 AM 04/09/2023    3:34 AM  CMP  Glucose 70 - 99 mg/dL 892  883  886   BUN 8 - 23 mg/dL 32  28  33   Creatinine 0.61 - 1.24 mg/dL 8.70  8.75  8.47   Sodium 135 - 145 mmol/L 135  137  137   Potassium 3.5 - 5.1 mmol/L 4.0  3.8  3.9   Chloride 98 - 111 mmol/L 104  104  106   CO2 22 - 32 mmol/L 24  23  23    Calcium  8.9 - 10.3 mg/dL 9.1  8.6  8.7   Total Protein 6.5 - 8.1 g/dL 6.6  6.3    Total Bilirubin 0.0 - 1.2 mg/dL 0.6  0.5    Alkaline Phos 38 - 126 U/L 42  35    AST 15 - 41 U/L 24  20    ALT 0 - 44 U/L 19  21      RADIOGRAPHIC STUDIES: I have personally reviewed the radiological images as listed and agreed with the findings in the report. CT Chest Wo Contrast Result Date: 03/08/2024 CLINICAL DATA:  Lung cancer restaging * Tracking Code: BO * EXAM: CT CHEST WITHOUT CONTRAST TECHNIQUE: Multidetector CT imaging of the chest was performed following the standard protocol without IV contrast. RADIATION DOSE REDUCTION: This exam was performed according to the departmental dose-optimization program which includes automated exposure control, adjustment of the mA and/or kV according to patient size and/or use of iterative  reconstruction technique. COMPARISON:  03/22/2023 FINDINGS: Cardiovascular: Aortic atherosclerosis. Normal heart size. Three-vessel coronary artery calcifications and stents. No pericardial effusion. Mediastinum/Nodes: No enlarged mediastinal, hilar, or axillary lymph nodes. Thyroid  gland, trachea, and esophagus demonstrate no significant findings. Lungs/Pleura: Unchanged post treatment/post radiation appearance of the left chest, with dense fibrosis and consolidation of the perihilar and suprahilar left lung. Moderate centrilobular emphysema. Diffuse bilateral bronchial wall thickening. Unchanged 0.3 cm nodule of the right lower lobe, benign, requiring no further follow-up or characterization (series 4, image 80). No pleural effusion or pneumothorax. Upper Abdomen: No acute abnormality. Severe atrophy of the left kidney. Musculoskeletal: No chest wall abnormality. No acute osseous findings. IMPRESSION: 1. Unchanged post treatment/post radiation appearance of the left chest, with dense fibrosis and consolidation of the perihilar and suprahilar left lung. 2. No evidence of recurrent or metastatic disease in the chest. 3. Emphysema and diffuse bilateral bronchial wall thickening. 4. Coronary artery disease. Aortic Atherosclerosis (ICD10-I70.0) and Emphysema (ICD10-J43.9). Electronically Signed   By: Marolyn JONETTA Jaksch M.D.   On: 03/08/2024 21:27

## 2024-03-11 NOTE — Assessment & Plan Note (Signed)
Continue prednisone Follow up with endocrinology

## 2024-03-11 NOTE — Assessment & Plan Note (Addendum)
#  Stage IIIB lung adenocarcinoma, s/p concurrent chemo/RT and immunotherapy, finished all treatment in January 2020 Oct 2025 CT chest abdomen pelvis without contrast reviewed. No recurrence.  He is more than 5 year since he finishes treatment  Given his history of previous smoking history, recommend to continue CT annually.

## 2024-03-11 NOTE — Assessment & Plan Note (Signed)
 Lab Results  Component Value Date   HGB 12.6 (L) 03/05/2024   TIBC 386 03/05/2024   IRONPCTSAT 12 (L) 03/05/2024   FERRITIN 16 (L) 03/05/2024    Recommend IV venofer  weekly x 3 Etiology of IDA is unknown.  EGD and colonoscopy results reviewed.  Follow-up with GI

## 2024-03-11 NOTE — Assessment & Plan Note (Signed)
 Lab Results  Component Value Date   MPROTEIN 0.2 (H) 11/27/2023   KPAFRELGTCHN 43.6 (H) 11/27/2023   LAMBDASER 29.5 (H) 11/27/2023   KAPLAMBRATIO 1.48 11/27/2023    Stable M protein Continue observation.

## 2024-03-18 ENCOUNTER — Inpatient Hospital Stay

## 2024-03-18 VITALS — BP 113/61 | HR 70 | Temp 98.2°F | Resp 18

## 2024-03-18 DIAGNOSIS — D509 Iron deficiency anemia, unspecified: Secondary | ICD-10-CM | POA: Diagnosis not present

## 2024-03-18 DIAGNOSIS — E860 Dehydration: Secondary | ICD-10-CM

## 2024-03-18 DIAGNOSIS — C3492 Malignant neoplasm of unspecified part of left bronchus or lung: Secondary | ICD-10-CM

## 2024-03-18 MED ORDER — SODIUM CHLORIDE 0.9% FLUSH
10.0000 mL | Freq: Once | INTRAVENOUS | Status: AC | PRN
  Administered 2024-03-18: 10 mL
  Filled 2024-03-18: qty 10

## 2024-03-18 MED ORDER — IRON SUCROSE 20 MG/ML IV SOLN
200.0000 mg | Freq: Once | INTRAVENOUS | Status: AC
  Administered 2024-03-18: 200 mg via INTRAVENOUS
  Filled 2024-03-18: qty 10

## 2024-03-18 NOTE — Progress Notes (Signed)
 Patient declined to wait the 30 minutes for post iron infusion observation today. Tolerated infusion well. VSS.

## 2024-03-20 ENCOUNTER — Inpatient Hospital Stay

## 2024-03-24 DIAGNOSIS — J8283 Eosinophilic asthma: Secondary | ICD-10-CM | POA: Diagnosis not present

## 2024-03-24 DIAGNOSIS — J4540 Moderate persistent asthma, uncomplicated: Secondary | ICD-10-CM | POA: Diagnosis not present

## 2024-03-25 ENCOUNTER — Other Ambulatory Visit: Payer: Self-pay | Admitting: Pulmonary Disease

## 2024-03-25 ENCOUNTER — Inpatient Hospital Stay

## 2024-03-25 VITALS — BP 121/74 | HR 71 | Temp 97.4°F | Resp 16

## 2024-03-25 DIAGNOSIS — C3492 Malignant neoplasm of unspecified part of left bronchus or lung: Secondary | ICD-10-CM

## 2024-03-25 DIAGNOSIS — E860 Dehydration: Secondary | ICD-10-CM

## 2024-03-25 DIAGNOSIS — D509 Iron deficiency anemia, unspecified: Secondary | ICD-10-CM

## 2024-03-25 DIAGNOSIS — J701 Chronic and other pulmonary manifestations due to radiation: Secondary | ICD-10-CM

## 2024-03-25 DIAGNOSIS — J8283 Eosinophilic asthma: Secondary | ICD-10-CM

## 2024-03-25 MED ORDER — IRON SUCROSE 20 MG/ML IV SOLN
200.0000 mg | Freq: Once | INTRAVENOUS | Status: AC
  Administered 2024-03-25: 200 mg via INTRAVENOUS
  Filled 2024-03-25: qty 10

## 2024-03-26 ENCOUNTER — Inpatient Hospital Stay

## 2024-03-27 DIAGNOSIS — I739 Peripheral vascular disease, unspecified: Secondary | ICD-10-CM | POA: Diagnosis not present

## 2024-03-27 DIAGNOSIS — I214 Non-ST elevation (NSTEMI) myocardial infarction: Secondary | ICD-10-CM | POA: Diagnosis not present

## 2024-03-27 DIAGNOSIS — J439 Emphysema, unspecified: Secondary | ICD-10-CM | POA: Diagnosis not present

## 2024-03-27 DIAGNOSIS — I10 Essential (primary) hypertension: Secondary | ICD-10-CM | POA: Diagnosis not present

## 2024-03-27 DIAGNOSIS — N1831 Chronic kidney disease, stage 3a: Secondary | ICD-10-CM | POA: Diagnosis not present

## 2024-03-27 DIAGNOSIS — R0602 Shortness of breath: Secondary | ICD-10-CM | POA: Diagnosis not present

## 2024-04-01 ENCOUNTER — Inpatient Hospital Stay: Attending: Oncology

## 2024-04-01 VITALS — BP 121/70 | HR 72 | Temp 97.6°F | Resp 17

## 2024-04-01 DIAGNOSIS — D509 Iron deficiency anemia, unspecified: Secondary | ICD-10-CM | POA: Insufficient documentation

## 2024-04-01 DIAGNOSIS — E860 Dehydration: Secondary | ICD-10-CM

## 2024-04-01 DIAGNOSIS — C3492 Malignant neoplasm of unspecified part of left bronchus or lung: Secondary | ICD-10-CM

## 2024-04-01 MED ORDER — SODIUM CHLORIDE 0.9% FLUSH
10.0000 mL | Freq: Once | INTRAVENOUS | Status: AC | PRN
  Administered 2024-04-01: 10 mL
  Filled 2024-04-01: qty 10

## 2024-04-01 MED ORDER — IRON SUCROSE 20 MG/ML IV SOLN
200.0000 mg | Freq: Once | INTRAVENOUS | Status: AC
  Administered 2024-04-01: 200 mg via INTRAVENOUS
  Filled 2024-04-01: qty 10

## 2024-04-02 ENCOUNTER — Inpatient Hospital Stay

## 2024-04-07 ENCOUNTER — Other Ambulatory Visit: Payer: Self-pay

## 2024-04-07 DIAGNOSIS — J449 Chronic obstructive pulmonary disease, unspecified: Secondary | ICD-10-CM | POA: Insufficient documentation

## 2024-04-07 DIAGNOSIS — J8283 Eosinophilic asthma: Secondary | ICD-10-CM | POA: Diagnosis not present

## 2024-04-07 DIAGNOSIS — Z85118 Personal history of other malignant neoplasm of bronchus and lung: Secondary | ICD-10-CM | POA: Diagnosis not present

## 2024-04-07 DIAGNOSIS — J4540 Moderate persistent asthma, uncomplicated: Secondary | ICD-10-CM | POA: Diagnosis not present

## 2024-04-07 DIAGNOSIS — H5789 Other specified disorders of eye and adnexa: Secondary | ICD-10-CM | POA: Diagnosis present

## 2024-04-07 DIAGNOSIS — H1033 Unspecified acute conjunctivitis, bilateral: Secondary | ICD-10-CM | POA: Insufficient documentation

## 2024-04-07 NOTE — ED Triage Notes (Signed)
 Pt reports watery itchy eyes since yesterday, pt reports hx of same and being dx with conjunctivitis.

## 2024-04-08 ENCOUNTER — Emergency Department
Admission: EM | Admit: 2024-04-08 | Discharge: 2024-04-08 | Disposition: A | Discharge status: Home / Self Care | Attending: Emergency Medicine | Admitting: Emergency Medicine

## 2024-04-08 DIAGNOSIS — H1033 Unspecified acute conjunctivitis, bilateral: Secondary | ICD-10-CM

## 2024-04-08 MED ORDER — LEVOFLOXACIN 0.5 % OP SOLN: 1.0000 [drp] | OPHTHALMIC | 0 refills | Status: DC

## 2024-04-08 MED ORDER — TETRACAINE HCL 0.5 % OP SOLN
1.0000 [drp] | Freq: Once | OPHTHALMIC | Status: AC
  Administered 2024-04-08: 1 [drp] via OPHTHALMIC
  Filled 2024-04-08: qty 4

## 2024-04-08 MED ORDER — HYPROMELLOSE (GONIOSCOPIC) 2.5 % OP SOLN: 1.0000 [drp] | OPHTHALMIC | 12 refills | Status: AC | PRN

## 2024-04-08 NOTE — ED Provider Notes (Signed)
 [facility] Provider Note    Event Date/Time   First MD Initiated Contact with Patient 04/08/24 [phone]     (approximate)   History   Eye Pain   HPI  Marc Gray is a 78 y.o. male with a history of COPD, lung cancer completed treatment who comes ED complaining of bilateral watery itchy eyes since yesterday.  Has a history of conjunctivitis.  No fever chills body aches headache vision change.  Denies eye trauma     Physical Exam   Triage Vital Signs: ED Triage Vitals  Encounter Vitals Group     BP 04/07/24 2235 (!) 141/77     Girls Systolic BP Percentile --      Girls Diastolic BP Percentile --      Boys Systolic BP Percentile --      Boys Diastolic BP Percentile --      Pulse Rate 04/07/24 2235 63     Resp 04/07/24 2235 17     Temp 04/07/24 2235 98.1 F (36.7 C)     Temp src --      SpO2 04/07/24 2235 99 %     Weight 04/07/24 2234 245 lb (111.1 kg)     Height 04/07/24 2234 6' 1 (1.854 m)     Head Circumference --      Peak Flow --      Pain Score 04/07/24 2234 8     Pain Loc --      Pain Education --      Exclude from Growth Chart --     Most recent vital signs: Vitals:   04/07/24 2235  BP: (!) 141/77  Pulse: 63  Resp: 17  Temp: 98.1 F (36.7 C)  SpO2: 99%    General: Awake, no distress.  CV:  Good peripheral perfusion.  Resp:  Normal effort.  Clear lungs Abd:  No distention.  Other:  Bilateral watery conjunctivitis with limbic sparing.  No hypopyon or hyphema.  Tonometer reading 12 mmHg bilaterally   ED Results / Procedures / Treatments   Labs (all labs ordered are listed, but only abnormal results are displayed) Labs Reviewed - No data to display   RADIOLOGY    PROCEDURES:  Procedures   MEDICATIONS ORDERED IN ED: Medications  tetracaine (PONTOCAINE) 0.5 % ophthalmic solution 1 drop (has no administration in time range)     IMPRESSION / MDM / ASSESSMENT AND PLAN / ED COURSE  I reviewed the  triage vital signs and the nursing notes.                             Patient presents with bilateral conjunctivitis, most likely allergic or viral, but with his age and comorbidities will treat with antibiotic drops.  No signs of orbital cellulitis, glaucoma, stable for discharge      FINAL CLINICAL IMPRESSION(S) / ED DIAGNOSES   Final diagnoses:  Acute conjunctivitis of both eyes, unspecified acute conjunctivitis type     Rx / DC Orders   ED Discharge Orders          Ordered    levofloxacin (QUIXIN) 0.5 % ophthalmic solution  Every 4 hours        04/08/24 0103    hydroxypropyl methylcellulose / hypromellose (ISOPTO TEARS / GONIOVISC) 2.5 % ophthalmic solution  As needed        04/08/24 0103  Note:  This document was prepared using Dragon voice recognition software and may include unintentional dictation errors.   Viviann Pastor, MD 04/08/24 [phone]

## 2024-04-12 ENCOUNTER — Observation Stay
Admission: EM | Admit: 2024-04-12 | Discharge: 2024-04-18 | Disposition: A | Discharge status: Home / Self Care | Attending: Hospitalist | Admitting: Hospitalist

## 2024-04-12 ENCOUNTER — Observation Stay

## 2024-04-12 ENCOUNTER — Emergency Department

## 2024-04-12 ENCOUNTER — Other Ambulatory Visit: Payer: Self-pay

## 2024-04-12 ENCOUNTER — Observation Stay: Admit: 2024-04-12

## 2024-04-12 ENCOUNTER — Encounter: Payer: Self-pay | Admitting: Emergency Medicine

## 2024-04-12 DIAGNOSIS — N1831 Chronic kidney disease, stage 3a: Secondary | ICD-10-CM | POA: Insufficient documentation

## 2024-04-12 DIAGNOSIS — I639 Cerebral infarction, unspecified: Secondary | ICD-10-CM | POA: Insufficient documentation

## 2024-04-12 DIAGNOSIS — D472 Monoclonal gammopathy: Secondary | ICD-10-CM | POA: Insufficient documentation

## 2024-04-12 DIAGNOSIS — G894 Chronic pain syndrome: Secondary | ICD-10-CM | POA: Diagnosis present

## 2024-04-12 DIAGNOSIS — R29898 Other symptoms and signs involving the musculoskeletal system: Secondary | ICD-10-CM | POA: Diagnosis present

## 2024-04-12 DIAGNOSIS — I10 Essential (primary) hypertension: Secondary | ICD-10-CM | POA: Diagnosis present

## 2024-04-12 DIAGNOSIS — F32A Depression, unspecified: Secondary | ICD-10-CM | POA: Insufficient documentation

## 2024-04-12 DIAGNOSIS — Z85118 Personal history of other malignant neoplasm of bronchus and lung: Secondary | ICD-10-CM | POA: Insufficient documentation

## 2024-04-12 DIAGNOSIS — Z8673 Personal history of transient ischemic attack (TIA), and cerebral infarction without residual deficits: Secondary | ICD-10-CM | POA: Insufficient documentation

## 2024-04-12 DIAGNOSIS — R9082 White matter disease, unspecified: Secondary | ICD-10-CM | POA: Diagnosis not present

## 2024-04-12 DIAGNOSIS — Z79899 Other long term (current) drug therapy: Secondary | ICD-10-CM | POA: Insufficient documentation

## 2024-04-12 DIAGNOSIS — G928 Other toxic encephalopathy: Secondary | ICD-10-CM | POA: Insufficient documentation

## 2024-04-12 DIAGNOSIS — I700 Atherosclerosis of aorta: Secondary | ICD-10-CM | POA: Diagnosis not present

## 2024-04-12 DIAGNOSIS — E271 Primary adrenocortical insufficiency: Secondary | ICD-10-CM | POA: Insufficient documentation

## 2024-04-12 DIAGNOSIS — Z639 Problem related to primary support group, unspecified: Secondary | ICD-10-CM

## 2024-04-12 DIAGNOSIS — M6281 Muscle weakness (generalized): Secondary | ICD-10-CM | POA: Diagnosis not present

## 2024-04-12 DIAGNOSIS — R531 Weakness: Principal | ICD-10-CM

## 2024-04-12 DIAGNOSIS — Z6830 Body mass index (BMI) 30.0-30.9, adult: Secondary | ICD-10-CM | POA: Insufficient documentation

## 2024-04-12 DIAGNOSIS — Z8546 Personal history of malignant neoplasm of prostate: Secondary | ICD-10-CM | POA: Insufficient documentation

## 2024-04-12 DIAGNOSIS — I739 Peripheral vascular disease, unspecified: Secondary | ICD-10-CM | POA: Insufficient documentation

## 2024-04-12 DIAGNOSIS — R29818 Other symptoms and signs involving the nervous system: Secondary | ICD-10-CM | POA: Diagnosis not present

## 2024-04-12 DIAGNOSIS — J441 Chronic obstructive pulmonary disease with (acute) exacerbation: Secondary | ICD-10-CM | POA: Insufficient documentation

## 2024-04-12 DIAGNOSIS — I129 Hypertensive chronic kidney disease with stage 1 through stage 4 chronic kidney disease, or unspecified chronic kidney disease: Secondary | ICD-10-CM | POA: Insufficient documentation

## 2024-04-12 DIAGNOSIS — G929 Unspecified toxic encephalopathy: Secondary | ICD-10-CM | POA: Insufficient documentation

## 2024-04-12 DIAGNOSIS — G319 Degenerative disease of nervous system, unspecified: Secondary | ICD-10-CM | POA: Diagnosis not present

## 2024-04-12 DIAGNOSIS — I7121 Aneurysm of the ascending aorta, without rupture: Secondary | ICD-10-CM | POA: Insufficient documentation

## 2024-04-12 DIAGNOSIS — Z85038 Personal history of other malignant neoplasm of large intestine: Secondary | ICD-10-CM | POA: Insufficient documentation

## 2024-04-12 DIAGNOSIS — H109 Unspecified conjunctivitis: Principal | ICD-10-CM | POA: Insufficient documentation

## 2024-04-12 DIAGNOSIS — R0602 Shortness of breath: Secondary | ICD-10-CM | POA: Diagnosis not present

## 2024-04-12 DIAGNOSIS — Z7982 Long term (current) use of aspirin: Secondary | ICD-10-CM | POA: Insufficient documentation

## 2024-04-12 DIAGNOSIS — G609 Hereditary and idiopathic neuropathy, unspecified: Secondary | ICD-10-CM | POA: Insufficient documentation

## 2024-04-12 DIAGNOSIS — W19XXXA Unspecified fall, initial encounter: Secondary | ICD-10-CM | POA: Insufficient documentation

## 2024-04-12 DIAGNOSIS — G8929 Other chronic pain: Secondary | ICD-10-CM | POA: Insufficient documentation

## 2024-04-12 DIAGNOSIS — J45909 Unspecified asthma, uncomplicated: Secondary | ICD-10-CM | POA: Insufficient documentation

## 2024-04-12 DIAGNOSIS — E032 Hypothyroidism due to medicaments and other exogenous substances: Secondary | ICD-10-CM | POA: Insufficient documentation

## 2024-04-12 DIAGNOSIS — E66811 Obesity, class 1: Secondary | ICD-10-CM | POA: Insufficient documentation

## 2024-04-12 DIAGNOSIS — E669 Obesity, unspecified: Secondary | ICD-10-CM | POA: Insufficient documentation

## 2024-04-12 DIAGNOSIS — R0989 Other specified symptoms and signs involving the circulatory and respiratory systems: Secondary | ICD-10-CM | POA: Diagnosis not present

## 2024-04-12 DIAGNOSIS — E2740 Unspecified adrenocortical insufficiency: Secondary | ICD-10-CM | POA: Diagnosis present

## 2024-04-12 DIAGNOSIS — C3492 Malignant neoplasm of unspecified part of left bronchus or lung: Secondary | ICD-10-CM | POA: Diagnosis present

## 2024-04-12 DIAGNOSIS — M25551 Pain in right hip: Secondary | ICD-10-CM | POA: Diagnosis not present

## 2024-04-12 LAB — CBC
HCT: 41.1 % (ref 39.0–52.0)
Hemoglobin: 13.3 g/dL (ref 13.0–17.0)
MCH: 30.9 pg (ref 26.0–34.0)
MCHC: 32.4 g/dL (ref 30.0–36.0)
MCV: 95.4 fL (ref 80.0–100.0)
Platelets: 142 K/uL — ABNORMAL LOW (ref 150–400)
RBC: 4.31 MIL/uL (ref 4.22–5.81)
RDW: 16.9 % — ABNORMAL HIGH (ref 11.5–15.5)
WBC: 6.6 K/uL (ref 4.0–10.5)
nRBC: 0 % (ref 0.0–0.2)

## 2024-04-12 LAB — CBG MONITORING, ED: Glucose-Capillary: 109 mg/dL — ABNORMAL HIGH (ref 70–99)

## 2024-04-12 LAB — COMPREHENSIVE METABOLIC PANEL WITH GFR
ALT: 20 U/L (ref 0–44)
AST: 22 U/L (ref 15–41)
Albumin: 3.9 g/dL (ref 3.5–5.0)
Alkaline Phosphatase: 45 U/L (ref 38–126)
Anion gap: 8 (ref 5–15)
BUN: 24 mg/dL — ABNORMAL HIGH (ref 8–23)
CO2: 24 mmol/L (ref 22–32)
Calcium: 9.3 mg/dL (ref 8.9–10.3)
Chloride: 109 mmol/L (ref 98–111)
Creatinine, Ser: 1.38 mg/dL — ABNORMAL HIGH (ref 0.61–1.24)
GFR, Estimated: 53 mL/min — ABNORMAL LOW (ref >60)
Glucose, Bld: 111 mg/dL — ABNORMAL HIGH (ref 70–99)
Potassium: 4.7 mmol/L (ref 3.5–5.1)
Sodium: 140 mmol/L (ref 135–145)
Total Bilirubin: 0.3 mg/dL (ref 0.0–1.2)
Total Protein: 6.1 g/dL — ABNORMAL LOW (ref 6.5–8.1)

## 2024-04-12 LAB — RESPIRATORY PANEL BY PCR

## 2024-04-12 LAB — HEMOGLOBIN A1C
Hgb A1c MFr Bld: 5.3 % (ref 4.8–5.6)
Mean Plasma Glucose: 105.41 mg/dL

## 2024-04-12 LAB — DIFFERENTIAL
Abs Immature Granulocytes: 0.11 K/uL — ABNORMAL HIGH (ref 0.00–0.07)
Basophils Absolute: 0 K/uL (ref 0.0–0.1)
Basophils Relative: 1 %
Eosinophils Absolute: 0.2 K/uL (ref 0.0–0.5)
Eosinophils Relative: 3 %
Immature Granulocytes: 2 %
Lymphocytes Relative: 13 %
Lymphs Abs: 0.9 K/uL (ref 0.7–4.0)
Monocytes Absolute: 0.9 K/uL (ref 0.1–1.0)
Monocytes Relative: 13 %
Neutro Abs: 4.5 K/uL (ref 1.7–7.7)
Neutrophils Relative %: 68 %

## 2024-04-12 LAB — PROTIME-INR
INR: 1 (ref 0.8–1.2)
Prothrombin Time: 13.9 s (ref 11.4–15.2)

## 2024-04-12 LAB — APTT: aPTT: 35 s (ref 24–36)

## 2024-04-12 MED ORDER — ROSUVASTATIN CALCIUM 10 MG PO TABS
10.0000 mg | ORAL_TABLET | Freq: Every day | ORAL | Status: DC
  Administered 2024-04-12 – 2024-04-18 (×7): 10 mg via ORAL
  Filled 2024-04-12 (×7): qty 1

## 2024-04-12 MED ORDER — STROKE: EARLY STAGES OF RECOVERY BOOK: Freq: Once | Status: AC

## 2024-04-12 MED ORDER — ACETAMINOPHEN 160 MG/5ML PO SOLN: 650.0000 mg | ORAL | Status: DC | PRN

## 2024-04-12 MED ORDER — SENNOSIDES-DOCUSATE SODIUM 8.6-50 MG PO TABS
1.0000 | ORAL_TABLET | Freq: Every evening | ORAL | Status: DC | PRN
  Administered 2024-04-17: 1 via ORAL
  Filled 2024-04-12: qty 1

## 2024-04-12 MED ORDER — BENZONATATE 100 MG PO CAPS
100.0000 mg | ORAL_CAPSULE | Freq: Three times a day (TID) | ORAL | Status: DC | PRN
  Administered 2024-04-15 – 2024-04-18 (×3): 100 mg via ORAL
  Filled 2024-04-12 (×3): qty 1

## 2024-04-12 MED ORDER — LEVOTHYROXINE SODIUM 137 MCG PO TABS
137.0000 ug | ORAL_TABLET | Freq: Every day | ORAL | Status: DC
  Administered 2024-04-13 – 2024-04-14 (×2): 137 ug via ORAL
  Filled 2024-04-12 (×2): qty 1

## 2024-04-12 MED ORDER — ENOXAPARIN SODIUM 60 MG/0.6ML IJ SOSY
0.5000 mg/kg | PREFILLED_SYRINGE | INTRAMUSCULAR | Status: DC
  Administered 2024-04-12 – 2024-04-18 (×7): 55 mg via SUBCUTANEOUS
  Filled 2024-04-12 (×7): qty 0.6

## 2024-04-12 MED ORDER — PANTOPRAZOLE SODIUM 40 MG PO TBEC
40.0000 mg | DELAYED_RELEASE_TABLET | Freq: Two times a day (BID) | ORAL | Status: DC
  Administered 2024-04-12 – 2024-04-18 (×14): 40 mg via ORAL
  Filled 2024-04-12 (×14): qty 1

## 2024-04-12 MED ORDER — GABAPENTIN 100 MG PO CAPS
100.0000 mg | ORAL_CAPSULE | Freq: Three times a day (TID) | ORAL | Status: DC
Start: 2024-04-12 — End: 2024-04-18
  Administered 2024-04-12 – 2024-04-18 (×20): 100 mg via ORAL
  Filled 2024-04-12 (×20): qty 1

## 2024-04-12 MED ORDER — SODIUM CHLORIDE 0.9 % IV SOLN: INTRAVENOUS | Status: AC

## 2024-04-12 MED ORDER — ACETAMINOPHEN 325 MG PO TABS: 650.0000 mg | ORAL_TABLET | ORAL | Status: DC | PRN

## 2024-04-12 MED ORDER — DOXYCYCLINE HYCLATE 100 MG PO TABS
100.0000 mg | ORAL_TABLET | Freq: Two times a day (BID) | ORAL | Status: DC
  Administered 2024-04-12 – 2024-04-13 (×3): 100 mg via ORAL
  Filled 2024-04-12 (×3): qty 1

## 2024-04-12 MED ORDER — ENOXAPARIN SODIUM 40 MG/0.4ML IJ SOSY: 40.0000 mg | PREFILLED_SYRINGE | INTRAMUSCULAR | Status: DC

## 2024-04-12 MED ORDER — METHYLPREDNISOLONE SODIUM SUCC 125 MG IJ SOLR
80.0000 mg | INTRAMUSCULAR | Status: DC
  Administered 2024-04-12 – 2024-04-14 (×3): 80 mg via INTRAVENOUS
  Filled 2024-04-12 (×3): qty 2

## 2024-04-12 MED ORDER — IPRATROPIUM-ALBUTEROL 0.5-2.5 (3) MG/3ML IN SOLN
3.0000 mL | Freq: Two times a day (BID) | RESPIRATORY_TRACT | Status: DC
  Administered 2024-04-13: 3 mL via RESPIRATORY_TRACT
  Filled 2024-04-12 (×2): qty 3

## 2024-04-12 MED ORDER — TRAZODONE HCL 50 MG PO TABS
100.0000 mg | ORAL_TABLET | Freq: Every day | ORAL | Status: DC
  Administered 2024-04-12 – 2024-04-17 (×6): 100 mg via ORAL
  Filled 2024-04-12 (×6): qty 2

## 2024-04-12 MED ORDER — IPRATROPIUM-ALBUTEROL 0.5-2.5 (3) MG/3ML IN SOLN
3.0000 mL | Freq: Four times a day (QID) | RESPIRATORY_TRACT | Status: DC
  Administered 2024-04-12: 3 mL via RESPIRATORY_TRACT
  Filled 2024-04-12: qty 3

## 2024-04-12 MED ORDER — ACETAMINOPHEN 650 MG RE SUPP: 650.0000 mg | RECTAL | Status: DC | PRN

## 2024-04-12 MED ORDER — ASPIRIN 81 MG PO TBEC
81.0000 mg | DELAYED_RELEASE_TABLET | Freq: Every day | ORAL | Status: DC
  Administered 2024-04-12 – 2024-04-18 (×7): 81 mg via ORAL
  Filled 2024-04-12 (×7): qty 1

## 2024-04-12 MED ORDER — TAMSULOSIN HCL 0.4 MG PO CAPS
0.4000 mg | ORAL_CAPSULE | Freq: Every day | ORAL | Status: DC
  Administered 2024-04-12 – 2024-04-18 (×7): 0.4 mg via ORAL
  Filled 2024-04-12 (×7): qty 1

## 2024-04-12 MED ORDER — METHYLPREDNISOLONE SODIUM SUCC 40 MG IJ SOLR: 40.0000 mg | Freq: Two times a day (BID) | INTRAMUSCULAR | Status: DC

## 2024-04-12 NOTE — Assessment & Plan Note (Signed)
 On trazodone  at home nightly. Continue.

## 2024-04-12 NOTE — Assessment & Plan Note (Signed)
 On Flomax . History of TURP in 2020.

## 2024-04-12 NOTE — Progress Notes (Signed)
 SLP Cancellation Note  Patient Details Name: Marc Gray MRN: [id number] DOB: Sep 14, 1945   Cancelled treatment:        Received SLP speech and language eval order. Chart reviewed and Pt visited. No evidence of CVA on CT scan, MRI pending. Wife present and supportive. Wife and Pt report speech is at baseline with no new deficits. Pt did not have in his dentures and spoke fast, this listener occasionally had to ask for repetition. No evidence of language deficits or dysarthria. Pt is NPO for MRI but had just finished a few sips of coffee when SLP entered with no noted overt s/s of aspiration. Advised to hold off on further PO's until cleared by Nsg after MRI. NO SLP language eval indicated at this time. Pt reports he tolerates a regular diet at home. Please reconsult if further concerns arise.   [name] 04/12/2024, 10:38 AM

## 2024-04-12 NOTE — ED Provider Notes (Signed)
 [facility] Provider Note    Event Date/Time   First MD Initiated Contact with Patient 04/12/24 [phone]     (approximate)   History   Fall   HPI  Marc Gray is a 78 y.o. male   Past medical history of CAD, prior MI, PVD, CKD, lung cancer in remission who presents to Emergency Department with left-sided weakness.  Last known normal was Wednesday evening.  Awoke on Thursday morning with weakness to the left side of his body having difficulty getting out of bed, has had to have his wife assist him to move around the last couple of days.  Seems to wax and wane but has been mostly persistent.  Continues to have weakness on left side today.  Difficulty getting out of bed slid down onto his buttocks with no significant injury.  Denies chest pain, shortness of breath, respiratory symptoms, GI or GU complaints.  No other acute medical complaints.    Independent Historian contributed to assessment above: Wife corroborates information and past medical history as above  External Medical Documents Reviewed: Previous outpatient oncology notes      Physical Exam   Triage Vital Signs: ED Triage Vitals  Encounter Vitals Group     BP 04/12/24 0330 108/63     Girls Systolic BP Percentile --      Girls Diastolic BP Percentile --      Boys Systolic BP Percentile --      Boys Diastolic BP Percentile --      Pulse Rate 04/12/24 0330 (!) 56     Resp 04/12/24 0330 19     Temp 04/12/24 0330 98.3 F (36.8 C)     Temp Source 04/12/24 0330 Oral     SpO2 04/12/24 0330 95 %     Weight 04/12/24 0334 246 lb 9.6 oz (111.9 kg)     Height 04/12/24 0334 6' 1 (1.854 m)     Head Circumference --      Peak Flow --      Pain Score 04/12/24 0333 0     Pain Loc --      Pain Education --      Exclude from Growth Chart --     Most recent vital signs: Vitals:   04/12/24 0330  BP: 108/63  Pulse: (!) 56  Resp: 19  Temp: 98.3 F (36.8 C)  SpO2: 95%     General: Awake, no distress.  CV:  Good peripheral perfusion.  Resp:  Normal effort.  Abd:  No distention.  Other:  Pleasant gentleman conversing normally awake alert oriented with normal vital signs.  Clear lungs soft nontender abdomen.  No signs of head trauma.  Able to range all extremities.  Neurologic exam shows a very subtle decrease grip strength in the left, as well as very subtle decreased strength to the left lower extremity but able to raise against gravity and hold for 10-second count.  No dysarthria.  No facial asymmetry.  Finger-to-nose coordination is normal.  Limbs appear warm and well-perfused   ED Results / Procedures / Treatments   Labs (all labs ordered are listed, but only abnormal results are displayed) Labs Reviewed  CBC - Abnormal; Notable for the following components:      Result Value   RDW 16.9 (*)    Platelets 142 (*)    All other components within normal limits  DIFFERENTIAL - Abnormal; Notable for the following components:   Abs Immature Granulocytes 0.11 (*)  All other components within normal limits  CBG MONITORING, ED - Abnormal; Notable for the following components:   Glucose-Capillary 109 (*)    All other components within normal limits  PROTIME-INR  APTT  COMPREHENSIVE METABOLIC PANEL WITH GFR     I ordered and reviewed the above labs they are notable for cell counts and electrolytes largely unremarkable  EKG  ED ECG REPORT I, Ginnie Shams, the attending physician, personally viewed and interpreted this ECG.   Date: 04/12/2024  EKG Time: 0518  Rate: 54  Rhythm: sinus  Axis: LAD  Intervals:long pr  ST&T Change: no stemi    RADIOLOGY I independently reviewed and interpreted CT of the head and see no obvious bleeding or midline shift I also reviewed radiologist's formal read.   PROCEDURES:  Critical Care performed: Yes, see critical care procedure note(s)  .Critical Care  Performed by: Shams Ginnie, MD Authorized by:  Shams Ginnie, MD   Critical care provider statement:    Critical care time (minutes):  30   Critical care was time spent personally by me on the following activities:  Development of treatment plan with patient or surrogate, discussions with consultants, evaluation of patient's response to treatment, examination of patient, ordering and review of laboratory studies, ordering and review of radiographic studies, ordering and performing treatments and interventions, pulse oximetry, re-evaluation of patient's condition and review of old charts    MEDICATIONS ORDERED IN ED: Medications - No data to display  External physician / consultants:  I spoke with hospital medicine for admission and regarding care plan for this patient.   IMPRESSION / MDM / ASSESSMENT AND PLAN / ED COURSE  I reviewed the triage vital signs and the nursing notes.                                Patient's presentation is most consistent with acute presentation with potential threat to life or bodily function.  Differential diagnosis includes, but is not limited to, acute neurologic deficits concerning for stroke, TIA, considered but less likely ischemia or trauma related   The patient is on the cardiac monitor to evaluate for evidence of arrhythmia and/or significant heart rate changes.  MDM:    Greater than 48 hours of left-sided deficits only, very subtle, per patient report appears to be waxing and waning but seems to be consistent and ongoing for approximately last 2-1/2 days.  Very subtle findings corresponding with his complaints of left-sided weakness on my exam today but no other focal neurologic deficits nor acute medical complaints from patient.  Outside of thrombolytic window so not a stroke code but will proceed with all stroke labs and CT head.  Minor trauma with no significant traumatic injuries noted on my exam.  CT head without bleeding or skull fracture.  Given ongoing neurologic changes, anticipate  admission for stroke workup, TIA workup.       FINAL CLINICAL IMPRESSION(S) / ED DIAGNOSES   Final diagnoses:  Left-sided weakness     Rx / DC Orders   ED Discharge Orders     None        Note:  This document was prepared using Dragon voice recognition software and may include unintentional dictation errors.    Shams Ginnie, MD 04/12/24 [id number]

## 2024-04-12 NOTE — ED Triage Notes (Signed)
 BIBA from home due to feeling weak in his left leg (more so in last 24 hours). Walks with walker and left leg gave out and he fell.  No LOC, did not hit his head and not on blood thinners.  Does have a skin tear on right elbow that is not bleeding  VS normal with EMS  125/68 HR 68 T 98.8

## 2024-04-12 NOTE — Assessment & Plan Note (Signed)
 On metoprolol  at home.  Also on Lasix  20 mg daily at home.  Clinically dehydrated. Currently holding.

## 2024-04-12 NOTE — Assessment & Plan Note (Signed)
 Takes family gram prednisone  daily. Did not increase his steroid use while recently he was feeling generalized weakness. His presentation with generalized weakness and a fall could be a component of adrenal insufficiency crisis. Currently on steroids IV.

## 2024-04-12 NOTE — Assessment & Plan Note (Signed)
 Body mass index is 32.53 kg/m.  Placing the patient at high risk for poor outcome. Possibility of OSA cannot be ruled out.

## 2024-04-12 NOTE — Assessment & Plan Note (Signed)
 Noted

## 2024-04-12 NOTE — Evaluation (Addendum)
 Occupational Therapy Evaluation Patient Details Name: Marc Gray MRN: [id number] DOB: September 26, 1945 Today's Date: 04/12/2024   History of Present Illness   Pt is a 78 y/o M admitted on 04/12/24 after presenting with c/o L sided weakness. Head CT negative for acute issues, MRI pending. PMH: CAD, prior MI, PVD, CKD, lung CA in remission     Clinical Impressions Pt. Presents with weakness, limited activity tolerance, limited functional mobility, and limited vision due to conjunctivitis which limits his ability to efficiently complete ADL and IADL functioning. Pt. Resides at home with his wife. Pt. Was independent with ADLs, and IADL functioning: including meal preparation, and medication management. Pt.'s wife had been assisting the Pt. More a few days leading up to admission. Pt.'s wife does the driving, and assists with transportation. Pt. Is independent with self-feeding, and self-grooming while seated. Pt. Requires CGA for for UE, and LE ADLs. Pt. Will  benefit from OT services for ADL training, A/E training, UE there. Ex. and Pt./caregiver education about home modification, and DME.      If plan is discharge home, recommend the following:    A little assist with bathing dressing, a little assist with transfers, a little assist with cooking     Functional Status Assessment   Patient has had a recent decline in their functional status and demonstrates the ability to make significant improvements in function in a reasonable and predictable amount of time.     Equipment Recommendations         Recommendations for Other Services   Rehab consult     Precautions/Restrictions   Precautions Precautions: Fall Restrictions Weight Bearing Restrictions Per Provider Order: No     Mobility Bed Mobility Overal bed mobility: Needs Assistance             General bed mobility comments: Pt. up in w/c upon arrival    Transfers Overall transfer level: Needs  assistance   Transfers: Sit to/from Stand Sit to Stand: Contact guard assist     Step pivot transfers: Contact guard assist            Balance     Sitting balance-Leahy Scale: Good     Standing balance support: During functional activity, No upper extremity supported                               ADL either performed or assessed with clinical judgement   ADL Overall ADL's : Needs assistance/impaired Eating/Feeding: Independent;Set up               Upper Body Dressing : Set up;Contact guard assist   Lower Body Dressing: Set up;Contact guard assist                       Vision   Additional Comments: Ptr. wearing sunglasses upon arrival-Reports that he and his wife both have have bacterial infections in  both eyes.     Perception         Praxis         Pertinent Vitals/Pain Pain Assessment Pain Assessment: 0-10 Pain Score: 5  Pain Location: Back Pain Descriptors / Indicators: Aching Pain Intervention(s): Limited activity within patient's tolerance, Monitored during session     Extremity/Trunk Assessment Upper Extremity Assessment Upper Extremity Assessment: Generalized weakness (Intact sensation to light touch, diminished proprioception bilaterally)   Lower Extremity Assessment Lower Extremity Assessment: Overall WFL for tasks assessed  Communication Communication Communication: No apparent difficulties   Cognition Arousal: Alert Behavior During Therapy: WFL for tasks assessed/performed                                 Following commands: Intact       Cueing  General Comments   Cueing Techniques: Verbal cues      Exercises     Shoulder Instructions      Home Living Family/patient expects to be discharged to:: Private residence Living Arrangements: Spouse/significant other Available Help at Discharge: Family Type of Home: House Home Access: Stairs to enter [employer] of Steps:  2-3 Entrance Stairs-Rails: Right;Left Home Layout: Able to live on main level with bedroom/bathroom;Two level     Bathroom Shower/Tub: Tub/shower unit         Home Equipment: Agricultural Consultant (2 wheels);Cane - single point;Grab bars - tub/shower   Additional Comments: Per pt, wife is not in good health either, they both help each other      Prior Functioning/Environment               Mobility Comments: Ambulatory with cane, notes falls prior to admission ADLs Comments: Independent with ADLs, IADLs, manages own medication, cooks-wife has assisted over the past several days, wife drives and  provides transportation.    OT Problem List:     OT Treatment/Interventions: Self-care/ADL training      OT Goals(Current goals can be found in the care plan section)   Acute Rehab OT Goals Patient Stated Goal: To have his eyes clear up OT Goal Formulation: With patient Time For Goal Achievement: 04/26/24 Potential to Achieve Goals: Good   OT Frequency:  Min 2X/week    Co-evaluation              AM-PAC OT 6 Clicks Daily Activity     Outcome Measure Help from another person eating meals?: None Help from another person taking care of personal grooming?: None Help from another person toileting, which includes using toliet, bedpan, or urinal?: A Little Help from another person bathing (including washing, rinsing, drying)?: A Little Help from another person to put on and taking off regular upper body clothing?: A Little Help from another person to put on and taking off regular lower body clothing?: A Little 6 Click Score: 20   End of Session Equipment Utilized During Treatment: Gait belt  Activity Tolerance: Patient tolerated treatment well Patient left: in chair  OT Visit Diagnosis: Muscle weakness (generalized) (M62.81)                Time: 9055-8990 OT Time Calculation (min): 25 min Charges:  OT General Charges $OT Visit: 1 Visit OT Evaluation $OT Eval Moderate  Complexity: 1 Mod. [place] Pechacek, MS, OTR/L   Richardson Otter 04/12/2024, 10:49 AM

## 2024-04-12 NOTE — Assessment & Plan Note (Signed)
 Renal function at baseline, creatinine 1.3. Avoid nephrotoxic medication. Receiving IV hydration as clinically appears to be dehydrated.

## 2024-04-12 NOTE — Progress Notes (Signed)
 PHARMACIST - PHYSICIAN COMMUNICATION  CONCERNING:  Enoxaparin  (Lovenox ) for DVT Prophylaxis    RECOMMENDATION: Patient was prescribed enoxaprin 40mg  q24 hours for VTE prophylaxis.   Filed Weights   04/12/24 0334  Weight: 111.9 kg (246 lb 9.6 oz)    Body mass index is 32.53 kg/m.  Estimated Creatinine Clearance: 58.8 mL/min (A) (by C-G formula based on SCr of 1.38 mg/dL (H)).   Based on [facility] policy patient is candidate for enoxaparin  0.5mg /kg TBW SQ every 24 hours based on BMI being >30.   DESCRIPTION: Pharmacy has adjusted enoxaparin  dose per [facility] policy.  Patient is now receiving enoxaparin  55 mg every 24 hours    Estill CHRISTELLA Lutes, PharmD, BCPS Clinical Pharmacist 04/12/2024 8:02 AM

## 2024-04-12 NOTE — Assessment & Plan Note (Signed)
 Chronic neuropathy.  On gabapentin .  Will continue.

## 2024-04-12 NOTE — Assessment & Plan Note (Signed)
 Recently seen for runny nose.  Has some cough.  Bilateral expiratory wheezing upon examination. Not hypoxic. Check chest x-ray. Check RVP. Add doxycycline, steroids IV, DuoNebs. Monitor oxygen while ambulating.

## 2024-04-12 NOTE — Hospital Course (Signed)
 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 11/11 was seen in the ED for conjunctivitis. Has runny eyes and has been taking OTC allergy medicine 4 tablets a day. Last he felt normal was on Wednesday 11/12 Since then he has been feeling weak and has difficulty getting out of the bed.  Continues to have generalized weakness and therefore has been asking for his wife to assist him get out of the bed. Reports intermittent weakness of his left leg and right arm. No tingling or numbness reported by him. Has been walking with a walker. Friday night while trying to get out of the bed he asked for help for his wife again but at the time he had significant weakness of his left leg. Unable to get to the walker and stand up and had a fall. Denies any head injury neck injury back injury.  Denies any headache neck pain at the time of my evaluation. He denies missing any medication.  Reports compliance with all of them including steroids. He does have some cough which he reports is chronic.  No fever no chills.  No nausea no vomiting.  No chest pain or shortness of breath.  No diarrhea. Denies any smoking or alcohol abuse right now. Remote history of smoking quit 2018. He does have some left eye ptosis but unable to tell me how long he has noticed it. Takes a baby aspirin . There is a reported history and problem list for CVA although last MRI in 2019 negative for any acute stroke.

## 2024-04-12 NOTE — Plan of Care (Signed)

## 2024-04-12 NOTE — H&P (Signed)
 History and Physical  Patient: Marc Gray FMW:[id number] DOB: 07-06-1945 DOA: 04/12/2024 DOS: the patient was seen and examined on 04/12/2024 Patient coming from: Home  Chief Complaint: Left leg weakness and generalized weakness with a fall  HPI: Patient with PMH of left lung cancer, prostate cancer, MGUS, hypothyroidism, COPD, eosinophilic asthma, adrenal insufficiency, CKD 3A, CAD, PAD, CVA, IDA on IV iron  presented to the hospital with generalized weakness.. 11/11 was seen in the ED for conjunctivitis. Has runny eyes and has been taking OTC allergy medicine 4 tablets a day. Last he felt normal was on Wednesday 11/12 Since then he has been feeling weak and has difficulty getting out of the bed.  Continues to have generalized weakness and therefore has been asking for his wife to assist him get out of the bed. Reports intermittent weakness of his left leg and right arm. No tingling or numbness reported by him. Has been walking with a walker. Friday night while trying to get out of the bed he asked for help for his wife again but at the time he had significant weakness of his left leg. Unable to get to the walker and stand up and had a fall. Denies any head injury neck injury back injury.  Denies any headache neck pain at the time of my evaluation. He denies missing any medication.  Reports compliance with all of them including steroids. He does have some cough which he reports is chronic.  No fever no chills.  No nausea no vomiting.  No chest pain or shortness of breath.  No diarrhea. Denies any smoking or alcohol abuse right now. Remote history of smoking quit 2018. He does have some left eye ptosis but unable to tell me how long he has noticed it. Takes a baby aspirin . There is a reported history and problem list for CVA although last MRI in 2019 negative for any acute stroke.  Assessment & Plan Left leg weakness Presents with a left leg weakness intermittently  occurring for last 48 hours. At the time of my evaluation no significant focal deficit. CT of the head unremarkable for any acute abnormality. Check MRI brain to rule out acute stroke. For now we will continue serial neurochecks, have PT OT evaluation. Monitor on telemetry. Continue 81 mg aspirin . Will consult neuro if MRI is positive for acute stroke. Suspect his generalized weakness as well as left-sided weakness is associated with use of OTC antihistaminic rather than acute cerebrovascular accident. Monitor. COPD with acute exacerbation (HCC) Recently seen for runny nose.  Has some cough.  Bilateral expiratory wheezing upon examination. Not hypoxic. Check chest x-ray. Check RVP. Add doxycycline, steroids IV, DuoNebs. Monitor oxygen while ambulating. Adrenal insufficiency Takes family gram prednisone  daily. Did not increase his steroid use while recently he was feeling generalized weakness. His presentation with generalized weakness and a fall could be a component of adrenal insufficiency crisis. Currently on steroids IV. Fall at home, initial encounter PT OT consulted. Differential as above. Currently no injury appreciated.  No pelvic injury on x-ray. Hypothyroidism due to medication Continue Synthroid  127. Check TSH and free T4.  Adenocarcinoma of left lung, stage 3 (HCC) Prior history noted. Follows up with oncology outpatient. MGUS (monoclonal gammopathy of unknown significance)  History of prostate cancer On Flomax . History of TURP in 2020. Stage 3a chronic kidney disease (HCC) Renal function at baseline, creatinine 1.3. Avoid nephrotoxic medication. Receiving IV hydration as clinically appears to be dehydrated. Ascending aortic aneurysm Noted. PAD (peripheral artery disease) On  81 mg aspirin .  For now we will continue. Chronic pain syndrome Chronic neuropathy.  On gabapentin .  Will continue. Primary hypertension On metoprolol  at home.  Also on Lasix  20 mg  daily at home.  Clinically dehydrated. Currently holding. Depression On trazodone  at home nightly. Continue. Obesity (BMI 30-39.9) Body mass index is 32.53 kg/m.  Placing the patient at high risk for poor outcome. Possibility of OSA cannot be ruled out.  Advance Care Planning:   Code Status: Full Code  Consults: none  Prior to Admission medications   Medication Sig Start Date End Date Taking? Authorizing Provider  acetaminophen  (TYLENOL ) 650 MG CR tablet Take 1,300 mg by mouth every 8 (eight) hours as needed for pain.   Yes [provider]  aspirin  EC 81 MG tablet Take 1 tablet (81 mg total) by mouth daily. Swallow whole. 03/22/21  Yes Babara Call, MD  Azelastine HCl 137 MCG/SPRAY SOLN SMARTSIG:1-2 Spray(s) Both Nares Twice Daily 09/15/19  Yes [provider]  BREZTRI AEROSPHERE 160-9-4.8 MCG/ACT AERO Inhale 2 puffs into the lungs 2 (two) times daily. 02/11/20  Yes [provider]  calcium -vitamin D  (OSCAL WITH D) 500-200 MG-UNIT tablet Take 1 tablet by mouth daily. 06/25/18  Yes Babara Call, MD  DUPIXENT 300 MG/2ML SOPN Inject 300 mg into the skin every 14 (fourteen) days. 11/15/22  Yes [provider]  EPINEPHrine 0.3 mg/0.3 mL IJ SOAJ injection Inject 0.3 mg into the muscle as needed. 02/08/22  Yes [provider]  fluticasone  (FLONASE ) 50 MCG/ACT nasal spray Place 1 spray into both nostrils daily. 09/25/18  Yes Babara Call, MD  furosemide  (LASIX ) 20 MG tablet Take 20 mg by mouth daily. 06/09/20  Yes [provider]  gabapentin  (NEURONTIN ) 100 MG capsule Take 1 capsule (100 mg total) by mouth 3 (three) times daily. 06/07/23 04/12/24 Yes Lateef, Bilal, MD  hydroxypropyl methylcellulose / hypromellose (ISOPTO TEARS / GONIOVISC) 2.5 % ophthalmic solution Place 1 drop into both eyes as needed for dry eyes. 04/08/24  Yes Viviann Pastor, MD  Ipratropium-Albuterol  (COMBIVENT) 20-100 MCG/ACT AERS respimat Inhale 1 puff into the lungs every 6 (six) hours as  needed. 11/26/19  Yes [provider]  levofloxacin CYDNEY) 0.5 % ophthalmic solution Place 1 drop into both eyes every 4 (four) hours for 7 days. 04/08/24 04/15/24 Yes Viviann Pastor, MD  levothyroxine  (SYNTHROID ) 137 MCG tablet Take 137 mcg by mouth daily before breakfast. 03/01/23  Yes [provider]  magnesium oxide (MAG-OX) 400 (240 Mg) MG tablet Take 400 mg by mouth daily.   Yes [provider]  Melatonin 10 MG TABS Take 10 mg by mouth at bedtime.    Yes [provider]  metoprolol  tartrate (LOPRESSOR ) 25 MG tablet Take 0.5 tablets (12.5 mg total) by mouth 2 (two) times daily. 12/30/22  Yes Awanda City, MD  montelukast  (SINGULAIR ) 10 MG tablet Take 10 mg by mouth daily. 10/06/21  Yes [provider]  Multiple Vitamin (MULTI-VITAMINS) TABS Take 1 tablet by mouth daily.    Yes [provider]  pantoprazole  (PROTONIX ) 40 MG tablet Take 1 tablet (40 mg total) by mouth daily. 06/04/19  Yes Babara Call, MD  predniSONE  (DELTASONE ) 5 MG tablet TAKE TWO TABLETS BY MOUTH IN THE MORNING AND TAKE ONE-HALF Tablets  BY MOUTH IN THE AFTERNOON 07/26/20  Yes [provider]  rosuvastatin  (CRESTOR ) 10 MG tablet Take 10 mg by mouth daily. 08/29/21  Yes [provider]  tadalafil  (CIALIS ) 10 MG tablet Take 1-2 tablets (10-20  mg total) by mouth daily as needed for erectile dysfunction. Take 5-10mg  by mouth as needed prior to intercourse 06/28/23  Yes Francisca Redell BROCKS, MD  tamsulosin  (FLOMAX ) 0.4 MG CAPS capsule Take 1 capsule (0.4 mg total) by mouth daily. 06/28/23  Yes Francisca Redell BROCKS, MD  traZODone  (DESYREL ) 100 MG tablet Take 100 mg by mouth at bedtime. 09/16/20  Yes [provider]  amphetamine -dextroamphetamine  (ADDERALL) 20 MG tablet Take by mouth. 05/03/23 03/11/24  [provider]  benzonatate  (TESSALON ) 200 MG capsule Take 1 capsule (200 mg total) by mouth 3 (three) times daily as needed for cough. Patient not taking: No sig  reported 07/24/23   Tobie Calix, MD  traMADol  (ULTRAM ) 50 MG tablet Take 50 mg by mouth every 6 (six) hours as needed for moderate pain (pain score 4-6). If he knows that he is going to be on his feet he will take a tablet to combat the pain Patient not taking: Reported on 04/12/2024    [provider]    Past Medical History:  Diagnosis Date   Adenocarcinoma of left lung, stage 3 (HCC)    Adrenal insufficiency    Anxiety    Aortic atherosclerosis    Arthritis    SHOULDER   Ascending aortic aneurysm    BPH (benign prostatic hyperplasia)    Cataract    Chronic kidney disease    low kidney function   Colon adenomas    Colon cancer (HCC) 2015   Pt states during his colonoscopy he had cancerous polyps removed.    COPD (chronic obstructive pulmonary disease) (HCC)    Coronary artery disease    Degeneration of intervertebral disc of lumbar region with discogenic back pain and lower extremity pain    Depression    SINCE DIAGNOSIS   Dyspnea    DOE   Enlarged prostate    GERD (gastroesophageal reflux disease)    History of prostate cancer    Hypertension    Hypothyroidism    Iron  deficiency anemia    Lung cancer (HCC)    Aug 2018   Monoclonal gammopathy of unknown significance    Myocardial infarction (HCC)    Pain    DUE TO LUNG ISSUE   Peripheral vascular disease    Pneumonia    Pulmonary emphysema (HCC)    Thoracic compression fracture (HCC)    Past Surgical History:  Procedure Laterality Date   BACK SURGERY     kyphoplasty  T5-6   CATARACT EXTRACTION W/ INTRAOCULAR LENS  IMPLANT, BILATERAL     cervical lymph node biopsy N/A    COLONOSCOPY     COLONOSCOPY N/A 02/05/2024   Procedure: COLONOSCOPY;  Surgeon: Toledo, Ladell POUR, MD;  Location: [facility] ENDOSCOPY;  Service: Gastroenterology;  Laterality: N/A;   COLONOSCOPY N/A 02/26/2024   Procedure: COLONOSCOPY;  Surgeon: Toledo, Ladell POUR, MD;  Location: [facility] ENDOSCOPY;  Service: Gastroenterology;  Laterality: N/A;   Effient    COLONOSCOPY WITH PROPOFOL  N/A 12/17/2019   Procedure: COLONOSCOPY WITH PROPOFOL ;  Surgeon: Dessa Reyes ORN, MD;  Location: [facility] ENDOSCOPY;  Service: Endoscopy;  Laterality: N/A;   CORONARY ANGIOPLASTY     CORONARY STENT INTERVENTION N/A 12/29/2022   Procedure: CORONARY STENT INTERVENTION;  Surgeon: Ammon Blunt, MD;  Location: [facility];  Service: Cardiovascular;  Laterality: N/A;   ESOPHAGEAL BRUSHING  02/26/2024   Procedure: ESOPHAGOSCOPY, WITH BRUSH BIOPSY;  Surgeon: Aundria, Ladell POUR, MD;  Location: [facility]COPY;  Service: Gastroenterology;;   ESOPHAGOGASTRODUODENOSCOPY N/A 02/05/2024  Procedure: EGD (ESOPHAGOGASTRODUODENOSCOPY);  Surgeon: Toledo, Ladell POUR, MD;  Location: [facility] ENDOSCOPY;  Service: Gastroenterology;  Laterality: N/A;   ESOPHAGOGASTRODUODENOSCOPY N/A 02/26/2024   Procedure: EGD (ESOPHAGOGASTRODUODENOSCOPY);  Surgeon: Toledo, Ladell POUR, MD;  Location: [facility] ENDOSCOPY;  Service: Gastroenterology;  Laterality: N/A;   EYE SURGERY Bilateral    cataract   HEMOSTASIS CLIP PLACEMENT  02/26/2024   Procedure: CONTROL OF HEMORRHAGE, GI TRACT, ENDOSCOPIC, BY CLIPPING OR OVERSEWING;  Surgeon: Toledo, Ladell POUR, MD;  Location: [facility] ENDOSCOPY;  Service: Gastroenterology;;   KYPHOPLASTY N/A    LEFT HEART CATH AND CORONARY ANGIOGRAPHY N/A 12/29/2022   Procedure: LEFT HEART CATH AND CORONARY ANGIOGRAPHY;  Surgeon: Ammon Blunt, MD;  Location: [facility];  Service: Cardiovascular;  Laterality: N/A;   LYMPH GLAND EXCISION N/A 02/09/2017   Procedure: CERVICAL LYMPH NODE BIOPSY;  Surgeon: Dessa Reyes ORN, MD;  Location: [facility] ORS;  Service: General;  Laterality: N/A;   POLYPECTOMY  02/26/2024   Procedure: POLYPECTOMY, INTESTINE;  Surgeon: Aundria, Ladell POUR, MD;  Location: [facility] ENDOSCOPY;  Service: Gastroenterology;;   [facility] REMOVAL N/A    PORTACATH PLACEMENT Right 02/23/2017   Procedure: INSERTION PORT-A-CATH;  Surgeon: Dessa Reyes ORN,  MD;  Location: [facility] ORS;  Service: General;  Laterality: Right;   PROSTATE SURGERY     TONSILLECTOMY     TRANSURETHRAL RESECTION OF PROSTATE N/A 02/14/2019   Procedure: TRANSURETHRAL RESECTION OF THE PROSTATE (TURP);  Surgeon: Francisca Redell BROCKS, MD;  Location: [facility] ORS;  Service: Urology;  Laterality: N/A;   Social History:  reports that he quit smoking about 7 years ago. His smoking use included cigarettes. He started smoking about 60 years ago. He has a 53 pack-year smoking history. He has been exposed to tobacco smoke. He has never used smokeless tobacco. He reports current alcohol use. He reports that he does not use drugs. Allergies  Allergen Reactions   Morphine  And Codeine    Family History  Problem Relation Age of Onset   Breast cancer Maternal Grandmother    Dementia Mother    Diabetes Father    Stroke Father    Physical Exam: Vitals:   04/12/24 0334 04/12/24 0651 04/12/24 0821 04/12/24 0824  BP:  (!) 154/69 (!) 147/74   Pulse:  (!) 59 (!) 52   Resp:  18 18   Temp:  98 F (36.7 C) 98 F (36.7 C)   TempSrc:  Oral    SpO2:  97% 95% 95%  Weight: 111.9 kg     Height: 6' 1 (1.854 m)      General: in Mild distress, No Rash, oral mucosa dry, no significant conjunctival injection noted. Cardiovascular: S1 and S2 Present, No Murmur Respiratory: Good respiratory effort, Bilateral Air entry present. No Crackles, bilateral expiratory wheezes Abdomen: Bowel Sound present, No tenderness Extremities: No edema Neuro: Alert and oriented x3, no new focal deficit, no pronator drift, finger-nose-finger normal, motor strength equal bilaterally both upper and lower extremity.  Data Reviewed: I have reviewed ED notes, Vitals, Lab results and outpatient records. Since last encounter, pertinent lab results CBC and BMP   . I have ordered test including CBC and BMP  . I have ordered imaging MRI brain and echocardiogram  .   Family Communication: No one at bedside  Author: Yetta Blanch,  MD 04/12/2024 9:51 AM For on call review [email or web address].

## 2024-04-12 NOTE — Assessment & Plan Note (Signed)
 On 81 mg aspirin .  For now we will continue.

## 2024-04-12 NOTE — Progress Notes (Signed)
 PHARMACIST - PHYSICIAN COMMUNICATION   CONCERNING: Methylprednisolone  IV    Current order: Methylprednisolone  IV 40mg  every 12 hours     DESCRIPTION: Per [facility] Protocol:   IV methylprednisolone  will be converted to either a q12h or q24h frequency with the same total daily dose (TDD).  Ordered Dose: 1 to 125 mg TDD; convert to: TDD q24h.  Ordered Dose: 126 to 250 mg TDD; convert to: TDD div q12h.  Ordered Dose: >250 mg TDD; DAW.  Order has been adjusted to: Methylprednisolone  IV 80mg  every 24 hours   Estill CHRISTELLA Lutes, PharmD, BCPS Clinical Pharmacist 04/12/2024 8:02 AM

## 2024-04-12 NOTE — Assessment & Plan Note (Signed)
 Prior history noted. Follows up with oncology outpatient.

## 2024-04-12 NOTE — Assessment & Plan Note (Signed)
 Presents with a left leg weakness intermittently occurring for last 48 hours. At the time of my evaluation no significant focal deficit. CT of the head unremarkable for any acute abnormality. Check MRI brain to rule out acute stroke. For now we will continue serial neurochecks, have PT OT evaluation. Monitor on telemetry. Continue 81 mg aspirin . Will consult neuro if MRI is positive for acute stroke. Suspect his generalized weakness as well as left-sided weakness is associated with use of OTC antihistaminic rather than acute cerebrovascular accident. Monitor.

## 2024-04-12 NOTE — Assessment & Plan Note (Signed)
 Continue Synthroid  127. Check TSH and free T4.

## 2024-04-12 NOTE — Assessment & Plan Note (Signed)
 PT OT consulted. Differential as above. Currently no injury appreciated.  No pelvic injury on x-ray.

## 2024-04-12 NOTE — Evaluation (Signed)
 Physical Therapy Evaluation Patient Details Name: Marc Gray MRN: [id number] DOB: April 13, 1946 Today's Date: 04/12/2024  History of Present Illness  Pt is a 78 y/o M admitted on 04/12/24 after presenting with c/o L sided weakness. Head CT negative for acute issues, MRI pending. PMH: CAD, prior MI, PVD, CKD, lung CA in remission  Clinical Impression  Pt seen for PT evaluation with pt agreeable to tx. Pt reports prior to admission he was living with wife on the main level of a 2 story home with 2-3 steps with wide set B rails to enter. Pt reports he ambulates with cane at baseline & wife isn't in great health either, reporting they help each other. On this date, pt is able to complete bed mobility with mod I, sit>stand with CGA & attempts gait with RW. Pt with posterior bias throughout mobility, requiring up to min assist at times to prevent LOB. Pt is able to perform figure four with LLE to don sock, reporting this is better than yesterday. Recommend ongoing PT services to progress mobility as able.        If plan is discharge home, recommend the following: A little help with bathing/dressing/bathroom;A little help with walking and/or transfers;Assistance with cooking/housework;Assist for transportation;Help with stairs or ramp for entrance   Can travel by private vehicle   Yes    Equipment Recommendations None recommended by PT  Recommendations for Other Services       Functional Status Assessment Patient has had a recent decline in their functional status and demonstrates the ability to make significant improvements in function in a reasonable and predictable amount of time.     Precautions / Restrictions Precautions Precautions: Fall Restrictions Weight Bearing Restrictions Per Provider Order: No      Mobility  Bed Mobility Overal bed mobility: Needs Assistance Bed Mobility: Supine to Sit     Supine to sit: Modified independent (Device/Increase time), HOB  elevated, Used rails (exit L side of bed)          Transfers Overall transfer level: Needs assistance Equipment used: None Transfers: Sit to/from Stand, Bed to chair/wheelchair/BSC Sit to Stand: Contact guard assist   Step pivot transfers: Contact guard assist       General transfer comment: posterior bias    Ambulation/Gait Ambulation/Gait assistance: Contact guard assist Gait Distance (Feet): 5 Feet Assistive device: Rolling walker (2 wheels) Gait Pattern/deviations: Decreased step length - right, Decreased step length - left Gait velocity: decreased     General Gait Details: Posterior bias throughout gait, lets go of walker to adjust pants, R knee genu recurvatum  Stairs            Wheelchair Mobility     Tilt Bed    Modified Rankin (Stroke Patients Only)       Balance Overall balance assessment: Needs assistance Sitting-balance support: Feet supported Sitting balance-Leahy Scale: Good     Standing balance support: During functional activity, No upper extremity supported Standing balance-Leahy Scale: Poor                               Pertinent Vitals/Pain Pain Assessment Pain Assessment: No/denies pain    Home Living Family/patient expects to be discharged to:: Private residence Living Arrangements: Spouse/significant other Available Help at Discharge: Family Type of Home: House Home Access: Stairs to enter Entrance Stairs-Rails: Right;Left (wide set) Entrance Stairs-Number of Steps: 2-3   Home Layout: Able to live on  main level with bedroom/bathroom;Two level Home Equipment: Agricultural Consultant (2 wheels);Cane - single point Additional Comments: Per pt, wife is not in good health either, they both help each other    Prior Function               Mobility Comments: Ambulatory with cane, notes falls prior to admission       Extremity/Trunk Assessment   Upper Extremity Assessment Upper Extremity Assessment: Overall WFL  for tasks assessed;Right hand dominant    Lower Extremity Assessment Lower Extremity Assessment: Overall WFL for tasks assessed       Communication   Communication Communication: No apparent difficulties    Cognition Arousal: Alert Behavior During Therapy: WFL for tasks assessed/performed   PT - Cognitive impairments: No apparent impairments                         Following commands: Intact       Cueing Cueing Techniques: Verbal cues     General Comments      Exercises     Assessment/Plan    PT Assessment Patient needs continued PT services  PT Problem List Decreased strength;Decreased activity tolerance;Decreased balance;Decreased mobility;Decreased knowledge of use of DME;Decreased safety awareness       PT Treatment Interventions DME instruction;Balance training;Gait training;Neuromuscular re-education;Stair training;Functional mobility training;Patient/family education;Therapeutic activities;Therapeutic exercise;Manual techniques    PT Goals (Current goals can be found in the Care Plan section)  Acute Rehab PT Goals Patient Stated Goal: none stated PT Goal Formulation: With patient Time For Goal Achievement: 04/26/24 Potential to Achieve Goals: Good    Frequency Min 2X/week     Co-evaluation               AM-PAC PT 6 Clicks Mobility  Outcome Measure Help needed turning from your back to your side while in a flat bed without using bedrails?: None Help needed moving from lying on your back to sitting on the side of a flat bed without using bedrails?: A Little Help needed moving to and from a bed to a chair (including a wheelchair)?: A Little Help needed standing up from a chair using your arms (e.g., wheelchair or bedside chair)?: A Little Help needed to walk in hospital room?: A Little Help needed climbing 3-5 steps with a railing? : A Little 6 Click Score: 19    End of Session   Activity Tolerance: Patient tolerated treatment  well Patient left: in chair;with chair alarm set;with call bell/phone within reach Nurse Communication: Mobility status PT Visit Diagnosis: Unsteadiness on feet (R26.81);Muscle weakness (generalized) (M62.81);Difficulty in walking, not elsewhere classified (R26.2);Other abnormalities of gait and mobility (R26.89)    Time: 9090-9077 PT Time Calculation (min) (ACUTE ONLY): 13 min   Charges:   PT Evaluation $PT Eval Low Complexity: 1 Low   PT General Charges $$ ACUTE PT VISIT: 1 Visit         Richerd Pinal, PT, DPT 04/12/24, 9:31 AM   Richerd CHRISTELLA Pinal 04/12/2024, 9:29 AM

## 2024-04-13 ENCOUNTER — Observation Stay

## 2024-04-13 ENCOUNTER — Observation Stay
Admit: 2024-04-13 | Discharge: 2024-04-13 | Disposition: A | Discharge status: Home / Self Care | Attending: Internal Medicine | Admitting: Internal Medicine

## 2024-04-13 DIAGNOSIS — Z639 Problem related to primary support group, unspecified: Secondary | ICD-10-CM

## 2024-04-13 DIAGNOSIS — D472 Monoclonal gammopathy: Secondary | ICD-10-CM | POA: Diagnosis not present

## 2024-04-13 DIAGNOSIS — Y92009 Unspecified place in unspecified non-institutional (private) residence as the place of occurrence of the external cause: Secondary | ICD-10-CM

## 2024-04-13 DIAGNOSIS — I10 Essential (primary) hypertension: Secondary | ICD-10-CM | POA: Diagnosis not present

## 2024-04-13 DIAGNOSIS — W19XXXA Unspecified fall, initial encounter: Secondary | ICD-10-CM | POA: Diagnosis not present

## 2024-04-13 DIAGNOSIS — M4726 Other spondylosis with radiculopathy, lumbar region: Secondary | ICD-10-CM | POA: Diagnosis not present

## 2024-04-13 DIAGNOSIS — N1831 Chronic kidney disease, stage 3a: Secondary | ICD-10-CM

## 2024-04-13 DIAGNOSIS — R29898 Other symptoms and signs involving the musculoskeletal system: Secondary | ICD-10-CM

## 2024-04-13 DIAGNOSIS — I739 Peripheral vascular disease, unspecified: Secondary | ICD-10-CM

## 2024-04-13 DIAGNOSIS — M4807 Spinal stenosis, lumbosacral region: Secondary | ICD-10-CM | POA: Diagnosis not present

## 2024-04-13 DIAGNOSIS — E669 Obesity, unspecified: Secondary | ICD-10-CM

## 2024-04-13 DIAGNOSIS — M48061 Spinal stenosis, lumbar region without neurogenic claudication: Secondary | ICD-10-CM | POA: Diagnosis not present

## 2024-04-13 DIAGNOSIS — R9082 White matter disease, unspecified: Secondary | ICD-10-CM | POA: Diagnosis not present

## 2024-04-13 LAB — CBC WITH DIFFERENTIAL/PLATELET
Abs Immature Granulocytes: 0.04 K/uL (ref 0.00–0.07)
Basophils Absolute: 0 K/uL (ref 0.0–0.1)
Basophils Relative: 1 %
Eosinophils Absolute: 0.2 K/uL (ref 0.0–0.5)
Eosinophils Relative: 5 %
HCT: 38.9 % — ABNORMAL LOW (ref 39.0–52.0)
Hemoglobin: 12.9 g/dL — ABNORMAL LOW (ref 13.0–17.0)
Immature Granulocytes: 1 %
Lymphocytes Relative: 31 %
Lymphs Abs: 1.4 K/uL (ref 0.7–4.0)
MCH: 31.2 pg (ref 26.0–34.0)
MCHC: 33.2 g/dL (ref 30.0–36.0)
MCV: 94 fL (ref 80.0–100.0)
Monocytes Absolute: 0.7 K/uL (ref 0.1–1.0)
Monocytes Relative: 15 %
Neutro Abs: 2.1 K/uL (ref 1.7–7.7)
Neutrophils Relative %: 47 %
Platelets: 119 K/uL — ABNORMAL LOW (ref 150–400)
RBC: 4.14 MIL/uL — ABNORMAL LOW (ref 4.22–5.81)
RDW: 16.8 % — ABNORMAL HIGH (ref 11.5–15.5)
WBC: 4.5 K/uL (ref 4.0–10.5)
nRBC: 0 % (ref 0.0–0.2)

## 2024-04-13 LAB — COMPREHENSIVE METABOLIC PANEL WITH GFR
ALT: 14 U/L (ref 0–44)
AST: 22 U/L (ref 15–41)
Albumin: 3.5 g/dL (ref 3.5–5.0)
Alkaline Phosphatase: 39 U/L (ref 38–126)
Anion gap: 11 (ref 5–15)
BUN: 19 mg/dL (ref 8–23)
CO2: 21 mmol/L — ABNORMAL LOW (ref 22–32)
Calcium: 8.9 mg/dL (ref 8.9–10.3)
Chloride: 108 mmol/L (ref 98–111)
Creatinine, Ser: 1.29 mg/dL — ABNORMAL HIGH (ref 0.61–1.24)
GFR, Estimated: 57 mL/min — ABNORMAL LOW (ref >60)
Glucose, Bld: 89 mg/dL (ref 70–99)
Potassium: 4.4 mmol/L (ref 3.5–5.1)
Sodium: 140 mmol/L (ref 135–145)
Total Bilirubin: 0.4 mg/dL (ref 0.0–1.2)
Total Protein: 5.6 g/dL — ABNORMAL LOW (ref 6.5–8.1)

## 2024-04-13 LAB — RESP PANEL BY RT-PCR (RSV, FLU A&B, COVID)  RVPGX2
Influenza A by PCR: NEGATIVE
Influenza B by PCR: NEGATIVE
Resp Syncytial Virus by PCR: NEGATIVE
SARS Coronavirus 2 by RT PCR: NEGATIVE

## 2024-04-13 LAB — LIPID PANEL
Cholesterol: 113 mg/dL (ref 0–200)
HDL: 30 mg/dL — ABNORMAL LOW (ref >40)
LDL Cholesterol: 49 mg/dL (ref 0–99)
Total CHOL/HDL Ratio: 3.8 ratio
Triglycerides: 170 mg/dL — ABNORMAL HIGH (ref <150)
VLDL: 34 mg/dL (ref 0–40)

## 2024-04-13 LAB — MAGNESIUM: Magnesium: 2 mg/dL (ref 1.7–2.4)

## 2024-04-13 LAB — TSH: TSH: 13.8 u[IU]/mL — ABNORMAL HIGH (ref 0.350–4.500)

## 2024-04-13 MED ORDER — LORAZEPAM 2 MG/ML IJ SOLN
1.0000 mg | Freq: Once | INTRAMUSCULAR | Status: AC
  Administered 2024-04-13: 1 mg via INTRAVENOUS
  Filled 2024-04-13: qty 1

## 2024-04-13 NOTE — Assessment & Plan Note (Signed)
 On trazodone  at home nightly. Continue.

## 2024-04-13 NOTE — Care Management Obs Status (Signed)
 MEDICARE OBSERVATION STATUS NOTIFICATION   Patient Details  Name: Marc Gray MRN: [id number] Date of Birth: [birth year]   Medicare Observation Status Notification Given:  Yes    Cedra Villalon LILLETTE Fenton, LCSW 04/13/2024, 10:44 AM

## 2024-04-13 NOTE — Assessment & Plan Note (Signed)
 Chronic neuropathy.  On gabapentin .  Will continue.

## 2024-04-13 NOTE — Assessment & Plan Note (Addendum)
-  Presents with a left leg weakness intermittently occurring for last 48 hours.   -Brain MRI without acute intracranial abnormality.  Moderate generalized atrophy with white matter disease which has progressed since prior MRI - Presently patient is to drowsy for evaluation, he has received trazodone  overnight and low-dose Ativan this morning.  In addition he has received some unknown amount of Benadryl  from his wife.  We will continue to monitor closely and reevaluate when he is more alert - Continue neurochecks - PT/OT eval - Monitor on telemetry - Continue with home dose baby aspirin  - No indication for neurology referral at this time - Suspect weakness secondary to recent antihistamine use, have counseled on cessation

## 2024-04-13 NOTE — Progress Notes (Signed)
 At 07:20 Am, nurse tech called this RN, Stated that pt fell. When entering the room pt found on the floor and helping him by nurse tech as well as night shift charge nurse Arland.wife at  bedside when fall occurred.wife states that he wouldn't let her call for assistance to get up, said that he could get up on his own- then fell . Pt is alert and oriented X 4. No any external injuries found.Do Dezii made aware. Get new order for head CT.  Denies any pain. Plan of care ongoing.

## 2024-04-13 NOTE — Plan of Care (Signed)
  Problem: Education: Goal: Knowledge of General Education information will improve Description: Including pain rating scale, medication(s)/side effects and non-pharmacologic comfort measures Outcome: Progressing   Problem: Health Behavior/Discharge Planning: Goal: Ability to manage health-related needs will improve Outcome: Progressing   Problem: Clinical Measurements: Goal: Ability to maintain clinical measurements within normal limits will improve Outcome: Progressing Goal: Will remain free from infection Outcome: Progressing Goal: Diagnostic test results will improve Outcome: Progressing Goal: Respiratory complications will improve Outcome: Progressing Goal: Cardiovascular complication will be avoided Outcome: Progressing   Problem: Activity: Goal: Risk for activity intolerance will decrease Outcome: Progressing   Problem: Nutrition: Goal: Adequate nutrition will be maintained Outcome: Progressing   Problem: Elimination: Goal: Will not experience complications related to bowel motility Outcome: Progressing Goal: Will not experience complications related to urinary retention Outcome: Progressing   Problem: Pain Managment: Goal: General experience of comfort will improve and/or be controlled Outcome: Progressing   Problem: Education: Goal: Knowledge of disease or condition will improve Outcome: Progressing Goal: Knowledge of secondary prevention will improve (MUST DOCUMENT ALL) Outcome: Progressing Goal: Knowledge of patient specific risk factors will improve (DELETE if not current risk factor) Outcome: Progressing   Problem: Health Behavior/Discharge Planning: Goal: Ability to manage health-related needs will improve Outcome: Progressing Goal: Goals will be collaboratively established with patient/family Outcome: Progressing   Problem: Self-Care: Goal: Ability to participate in self-care as condition permits will improve Outcome: Progressing Goal:  Verbalization of feelings and concerns over difficulty with self-care will improve Outcome: Progressing Goal: Ability to communicate needs accurately will improve Outcome: Progressing

## 2024-04-13 NOTE — Assessment & Plan Note (Signed)
 On metoprolol  at home.  Also on Lasix  20 mg daily at home.  Clinically dehydrated. Currently holding.

## 2024-04-13 NOTE — Assessment & Plan Note (Signed)
-  Recently seen for rhinorrhea.  Has some cough.  Wheezing on exam - Not hypoxic, no supplemental O2 needed at this time - CXR without evidence of pneumonia - Respiratory viral panel negative.  Flu/COVID/RSV still pending - Continue steroids, DuoNebs for now.  Given no evidence of bacterial pneumonia will discontinue doxycycline - Advised to discontinue antihistamines

## 2024-04-13 NOTE — Assessment & Plan Note (Signed)
 Noted

## 2024-04-13 NOTE — Assessment & Plan Note (Signed)
 Body mass index is 32.53 kg/m.  Placing the patient at high risk for poor outcome. Possibility of OSA cannot be ruled out.

## 2024-04-13 NOTE — Assessment & Plan Note (Signed)
 Renal function at baseline, creatinine 1.3. Avoid nephrotoxic medication. Receiving IV hydration as clinically appears to be dehydrated.

## 2024-04-13 NOTE — Assessment & Plan Note (Signed)
 On 81 mg aspirin .  For now we will continue.

## 2024-04-13 NOTE — Assessment & Plan Note (Signed)
-  At home takes 10 mg prednisone  in a.m. and 2.5 in PM. - Did not recently increase his steroid while he was feeling weak - Current generalized weakness could be a component of adrenal insufficiency - Currently on IV steroids for COPD exacerbation as above - Will proceed with taper at DC

## 2024-04-13 NOTE — Progress Notes (Signed)
 Mobility Specialist - Progress Note     04/13/24 1400  Mobility  Activity Stood at bedside;Ambulated with assistance;Dangled on edge of bed (Bathroom)  Level of Assistance +2 (takes two people)  Location Manager Ambulated (ft) 13 ft  Range of Motion/Exercises Active  Activity Response Tolerated poorly  Mobility Referral No  Mobility visit 1 Mobility   Pt resting on toilet upon entry on RA. Pt required heavy +2 assistance and required immediate manual lift back to bed due to knee buckle and instability. BSC is recommended due to confusion and ongoing safety. Pt left in bed with needs in reach and bed alarm activated.   Guido Rumble Mobility Specialist 04/13/24, 3:14 PM

## 2024-04-13 NOTE — TOC Progression Note (Signed)
 Transition of Care [facility]) - Progression Note    Patient Details  Name: Marc Gray MRN: [id number] Date of Birth: 1945-07-26  [facility] CM/SW Contact  Lorraine LILLETTE Fenton, LCSW Phone Number: 04/13/2024, 11:02 AM  Clinical Narrative:     Pt from home, CSW visited at bedside, spouse present, pt nodding and not a full participant.  MOON explained /delivered and signed.   Provided MOON copy and discussed SNF recommendation, spouse states that she /they needs time to discuss/think about this.  CSW advised a member of her team will follow up. ICM following.    Barriers to Discharge: Continued Medical Work up               Expected Discharge Plan and Services                                               Social Drivers of Health (SDOH) Interventions SDOH Screenings   Food Insecurity: No Food Insecurity (04/12/2024)  Housing: Unknown (04/12/2024)  Transportation Needs: No Transportation Needs (04/12/2024)  Utilities: Not At Risk (04/12/2024)  Depression (PHQ2-9): Low Risk  (12/25/2023)  Recent Concern: Depression (PHQ2-9) - Medium Risk (12/04/2023)  Financial Resource Strain: Low Risk  (11/07/2023)   Received from [facility]  Social Connections: Socially Integrated (04/12/2024)  Tobacco Use: Medium Risk (04/12/2024)    Readmission Risk Interventions     No data to display

## 2024-04-13 NOTE — Plan of Care (Signed)

## 2024-04-13 NOTE — Assessment & Plan Note (Signed)
-   Continue outpatient follow-up with oncology

## 2024-04-13 NOTE — Assessment & Plan Note (Signed)
 On Flomax . History of TURP in 2020.

## 2024-04-13 NOTE — Assessment & Plan Note (Signed)
 Continue home dose Synthroid. Check TSH

## 2024-04-13 NOTE — Assessment & Plan Note (Signed)
-  Differential as above - Continue PT/OT - No injury currently - Did have a fall in the hospital this morning.  Head CT ordered

## 2024-04-13 NOTE — Assessment & Plan Note (Signed)
 Multiple family members involved in patient's care, complex social relationships amongst each other. - Currently patient's primary caregiver is his wife who appears to have some degree of dementia and is not an appropriate person to be administering his medications. - TOC consulted for social work assistance.

## 2024-04-13 NOTE — Progress Notes (Signed)
 PROGRESS NOTE    Marc Gray  FMW:[id number] DOB: 03/06/1946 DOA: 04/12/2024 PCP: Marc Lynwood FALCON, MD  Chief Complaint  Patient presents with   [facility] Course:  Marc Gray left lung cancer, prostate cancer, MGUS, hypothyroidism, COPD, eosinophilic asthma, adrenal insufficiency, CKD stage IIIA, CAD, PAD, CVA, iron  deficiency anemia on IV iron , who presented to the hospital with generalized weakness.   He was recently seen in the ED 11/11 for conjunctivitis.  Has been taking OTC antihistamines 4 times daily. Reports last time he felt like normal was 11/12. He endorses intermittent weakness in his left leg and right arm, no tingling or numbness.  He has been walking with a walker.  His weakness has been progressive to the point that he is not having difficulty ambulating. On arrival to the ED he was noted to have left eye ptosis but was unable to report how long it had been that way.  He does take a daily aspirin . In the ED vital signs and labs mostly unremarkable.  CXR without acute process Respiratory viral panel negative.  Subjective: This morning patient has been acutely agitated and trying to get out of bed.  Bedside RN reports he was unable to be stopped and fell when trying to get out of bed. When the patient was found he was lying on his back.  Stat head CT was ordered which was negative for acute abnormality.  He received low-dose Ativan for agitation  At the time my evaluation he is very somnolent.  His wife is at bedside.  She reveals that she has been giving him Benadryl  while admitted due to itching.  We had extensive discussion regarding the importance of not administering any medications outside of what we are currently prescribing.  I advised her that if she is concerned about symptoms that are not being treated to please discuss with me or bedside RN so we can appropriately treat.  His wife becomes acutely confused during this  conversation and reports that she forgets what today is.  She starts talking about things that have happened this afternoon.  I reminded her that it is still the morning time.  She now reports that it is nighttime yesterday.  I again tried to ascertain if she has given Benadryl  this morning or last night and she can no longer remember.  Outside of the room I had a discussion directly with bedside RN Basil and charge nurse [name].  [name] removed all of patient's home medications from the room to avoid further confusion and medication administration.  I discussed his care on the phone directly with daughter [name].  His daughter [name] reports that she believes his wife is developing dementia but does not currently have a diagnosis.  She reports that they both live with the wife's younger sister who is capable of taking over medication administration.  Daughter, [name], reports she will need significant help via social work at time of discharge to ensure safety at home.  She reports there are multiple complex family relationships involved   Objective: Vitals:   04/13/24 0007 04/13/24 0357 04/13/24 0724 04/13/24 0724  BP: (!) 144/74 100/63  134/80  Pulse: 65 64  81  Resp: 18 18  (!) 21  Temp: 98.4 F (36.9 C) 98.3 F (36.8 C)  97.6 F (36.4 C)  TempSrc: Oral   Oral  SpO2: 97% 92% 97% 97%  Weight:      Height:  Intake/Output Summary (Last 24 hours) at 04/13/2024 9176 Last data filed at 04/12/2024 1915 Gross per 24 hour  Intake 240 ml  Output --  Net 240 ml   Filed Weights   04/12/24 0334  Weight: 111.9 kg    Examination: General exam: Sleeping Respiratory system: No work of breathing, symmetric chest wall expansion Cardiovascular system: S1 & S2 heard, RRR.  Gastrointestinal system: Abdomen is nondistended, soft and nontender.  Neuro: Currently very drowsy, and does not participate in evaluation  Assessment & Plan:  Active Problems:   Adenocarcinoma of left lung,  stage 3 (HCC)   Hypothyroidism due to medication   Adrenal insufficiency   MGUS (monoclonal gammopathy of unknown significance)   History of prostate cancer   Stage 3a chronic kidney disease (HCC)   Ascending aortic aneurysm   PAD (peripheral artery disease)   Chronic pain syndrome   Primary hypertension   Depression   Left leg weakness   Fall at home, initial encounter   COPD with acute exacerbation (HCC)   Obesity (BMI 30-39.9)    Assessment & Plan Left leg weakness -Presents with a left leg weakness intermittently occurring for last 48 hours.   -Brain MRI without acute intracranial abnormality.  Moderate generalized atrophy with white matter disease which has progressed since prior MRI - Presently patient is to drowsy for evaluation, he has received trazodone  overnight and low-dose Ativan this morning.  In addition he has received some unknown amount of Benadryl  from his wife.  We will continue to monitor closely and reevaluate when he is more alert - Continue neurochecks - PT/OT eval - Monitor on telemetry - Continue with home dose baby aspirin  - No indication for neurology referral at this time - Suspect weakness secondary to recent antihistamine use, have counseled on cessation COPD with acute exacerbation (HCC) -Recently seen for rhinorrhea.  Has some cough.  Wheezing on exam - Not hypoxic, no supplemental O2 needed at this time - CXR without evidence of pneumonia - Respiratory viral panel negative.  Flu/COVID/RSV still pending - Continue steroids, DuoNebs for now.  Given no evidence of bacterial pneumonia will discontinue doxycycline - Advised to discontinue antihistamines Adrenal insufficiency -At home takes 10 mg prednisone  in a.m. and 2.5 in PM. - Did not recently increase his steroid while he was feeling weak - Current generalized weakness could be a component of adrenal insufficiency - Currently on IV steroids for COPD exacerbation as above - Will proceed with  taper at DC Fall at home, initial encounter -Differential as above - Continue PT/OT - No injury currently - Did have a fall in the hospital this morning.  Head CT ordered Hypothyroidism due to medication Continue home dose Synthroid  - Check TSH  Adenocarcinoma of left lung, stage 3 (HCC) - Continue outpatient follow-up with oncology. MGUS (monoclonal gammopathy of unknown significance) Continue outpatient follow-up with oncology History of prostate cancer On Flomax . History of TURP in 2020. Stage 3a chronic kidney disease (HCC) Renal function at baseline, creatinine 1.3. Avoid nephrotoxic medication. Receiving IV hydration as clinically appears to be dehydrated. Ascending aortic aneurysm Noted. PAD (peripheral artery disease) On 81 mg aspirin .  For now we will continue. Chronic pain syndrome Chronic neuropathy.  On gabapentin .  Will continue. Primary hypertension On metoprolol  at home.  Also on Lasix  20 mg daily at home.  Clinically dehydrated. Currently holding. Depression On trazodone  at home nightly. Continue. Obesity (BMI 30-39.9) Body mass index is 32.53 kg/m.  Placing the patient at high risk for poor  outcome. Possibility of OSA cannot be ruled out. Family dynamics problem Multiple family members involved in patient's care, complex social relationships amongst each other. - Currently patient's primary caregiver is his wife who appears to have some degree of dementia and is not an appropriate person to be administering his medications. - TOC consulted for social work assistance. Obesity Class 1 - Outpatient follow up for lifestyle modification and risk factor management   DVT prophylaxis: Lovenox    Code Status: Full Code Disposition:  Inpatient pending clinical resolution  Consultants:    Procedures:    Antimicrobials:  Anti-infectives (From admission, onward)    Start     Dose/Rate Route Frequency Ordered Stop   04/12/24 1000  doxycycline (VIBRA-TABS)  tablet 100 mg        100 mg Oral Every 12 hours 04/12/24 0757         Data Reviewed: I have personally reviewed following labs and imaging studies CBC: Recent Labs  Lab 04/12/24 0527  WBC 6.6  NEUTROABS 4.5  HGB 13.3  HCT 41.1  MCV 95.4  PLT 142*   Basic Metabolic Panel: Recent Labs  Lab 04/12/24 0527 04/13/24 0440  NA 140 140  K 4.7 4.4  CL 109 108  CO2 24 21*  GLUCOSE 111* 89  BUN 24* 19  CREATININE 1.38* 1.29*  CALCIUM  9.3 8.9  MG  --  2.0   GFR: Estimated Creatinine Clearance: 62.9 mL/min (A) (by C-G formula based on SCr of 1.29 mg/dL (H)). Liver Function Tests: Recent Labs  Lab 04/12/24 0527 04/13/24 0440  AST 22 22  ALT 20 14  ALKPHOS 45 39  BILITOT 0.3 0.4  PROT 6.1* 5.6*  ALBUMIN 3.9 3.5   CBG: Recent Labs  Lab 04/12/24 0534  GLUCAP 109*    Recent Results (from the past 240 hours)  Respiratory (~20 pathogens) panel by PCR     Status: None   Collection Time: 04/12/24 12:00 PM   Specimen: Nasopharyngeal Swab; Respiratory  Result Value Ref Range Status   Adenovirus NOT DETECTED NOT DETECTED Final   Coronavirus 229E NOT DETECTED NOT DETECTED Final    Comment: (NOTE) The Coronavirus on the Respiratory Panel, DOES NOT test for the novel  Coronavirus (2019 nCoV)    Coronavirus HKU1 NOT DETECTED NOT DETECTED Final   Coronavirus NL63 NOT DETECTED NOT DETECTED Final   Coronavirus OC43 NOT DETECTED NOT DETECTED Final   Metapneumovirus NOT DETECTED NOT DETECTED Final   Rhinovirus / Enterovirus NOT DETECTED NOT DETECTED Final   Influenza A NOT DETECTED NOT DETECTED Final   Influenza B NOT DETECTED NOT DETECTED Final   Parainfluenza Virus 1 NOT DETECTED NOT DETECTED Final   Parainfluenza Virus 2 NOT DETECTED NOT DETECTED Final   Parainfluenza Virus 3 NOT DETECTED NOT DETECTED Final   Parainfluenza Virus 4 NOT DETECTED NOT DETECTED Final   Respiratory Syncytial Virus NOT DETECTED NOT DETECTED Final   Bordetella pertussis NOT DETECTED NOT DETECTED  Final   Bordetella Parapertussis NOT DETECTED NOT DETECTED Final   Chlamydophila pneumoniae NOT DETECTED NOT DETECTED Final   Mycoplasma pneumoniae NOT DETECTED NOT DETECTED Final    Comment: Performed at [facility], [address], [place], [place] [address]     Radiology Studies: MR BRAIN WO CONTRAST Result Date: 04/12/2024 EXAM: MRI BRAIN WITHOUT CONTRAST 04/12/2024 12:43:42 PM TECHNIQUE: Multiplanar multisequence MRI of the head/brain was performed without the administration of intravenous contrast. COMPARISON: MR Head without and with IV contrast 11/27/2017. CT head without  contrast 04/12/2024. CLINICAL HISTORY: Neuro deficit, acute, stroke suspected. Left-sided weakness. FINDINGS: BRAIN AND VENTRICLES: No acute infarct. No intracranial hemorrhage. No mass. No midline shift. No hydrocephalus. Moderate generalized atrophy and white matter disease is similar to the CT and has progressed since the prior MRI. The sella is unremarkable. Normal flow voids. ORBITS: Bilateral lens replacements are noted. The globes and orbits are otherwise within normal limits. SINUSES AND MASTOIDS: No acute abnormality. BONES AND SOFT TISSUES: Normal marrow signal. No acute soft tissue abnormality. IMPRESSION: 1. No acute intracranial abnormality. 2. Moderate generalized atrophy and white matter disease, progressed since prior MRI and similar to recent CT. Electronically signed by: Lonni Necessary MD 04/12/2024 12:59 PM EST RP Workstation: [id number]   DG Chest Port 1 View Result Date: 04/12/2024 EXAM: 1 VIEW(S) XRAY OF THE CHEST 04/12/2024 08:10:00 AM COMPARISON: Comparison is 07/22/2023. CLINICAL HISTORY: SOB (shortness of breath) FINDINGS: LUNGS AND PLEURA: Lung volumes are low. No focal pulmonary opacity. No pleural effusion. No pneumothorax. HEART AND MEDIASTINUM: Aortic atherosclerosis (ICD10-170.0). No acute abnormality of the cardiac and mediastinal silhouettes. BONES AND SOFT TISSUES: Thoracic  vertebral augmentation changes. No acute osseous abnormality. IMPRESSION: 1. No acute cardiopulmonary abnormality. 2. Low lung volumes. Electronically signed by: Lonni Necessary MD 04/12/2024 12:11 PM EST RP Workstation: [id number]   DG Pelvis 1-2 Views Result Date: 04/12/2024 CLINICAL DATA:  Fall with right hip pain. EXAM: PELVIS - 1-2 VIEW COMPARISON:  No comparison studies available. FINDINGS: No evidence for an acute fracture. SI joints and symphysis pubis unremarkable. No evidence for hip dislocation. IMPRESSION: No acute bony findings. Electronically Signed   By: Camellia Candle M.D.   On: 04/12/2024 05:06   CT HEAD WO CONTRAST Result Date: 04/12/2024 EXAM: CT HEAD WITHOUT CONTRAST 04/12/2024 04:53:56 AM TECHNIQUE: CT of the head was performed without the administration of intravenous contrast. Automated exposure control, iterative reconstruction, and/or weight based adjustment of the mA/kV was utilized to reduce the radiation dose to as low as reasonably achievable. COMPARISON: Comparison is made to 11/08/2020. CLINICAL HISTORY: Neuro deficit, acute, stroke suspected. FINDINGS: BRAIN AND VENTRICLES: No acute hemorrhage. No evidence of acute infarct. Parenchymal volume loss is commensurate with the patient's age. Periventricular white matter changes are present likely reflecting the sequela of small vessel ischemia. Mild ventricular megaly involving the lateral ventricles, likely the sequela of central atrophy and ex vacuo dilation. No extra-axial collection. No mass effect or midline shift. ORBITS: No acute abnormality. SINUSES: No acute abnormality. SOFT TISSUES AND SKULL: No acute soft tissue abnormality. No skull fracture. IMPRESSION: 1. No acute intracranial abnormality. 2. Parenchymal volume loss commensurate with the patient's age. 3. Periventricular white matter changes, likely sequela of small vessel ischemia. Electronically signed by: Dorethia Molt MD 04/12/2024 05:00 AM EST RP Workstation:  [id number]    Scheduled Meds:   stroke: early stages of recovery book   Does not apply Once   aspirin  EC  81 mg Oral Daily   doxycycline  100 mg Oral Q12H   enoxaparin  (LOVENOX ) injection  0.5 mg/kg Subcutaneous Q24H   gabapentin   100 mg Oral TID   ipratropium-albuterol   3 mL Nebulization BID   levothyroxine   137 mcg Oral QAC breakfast   LORazepam  1 mg Intravenous Once   methylPREDNISolone  (SOLU-MEDROL ) injection  80 mg Intravenous Q24H   pantoprazole   40 mg Oral BID AC   rosuvastatin   10 mg Oral Daily   tamsulosin   0.4 mg Oral Daily   traZODone   100 mg Oral QHS   Continuous  Infusions:  sodium chloride  75 mL/hr at 04/12/24 0859     LOS: 0 days  MDM: Patient is high risk for one or more organ failure.  They necessitate ongoing hospitalization for continued IV therapies and subsequent lab monitoring. Total time spent interpreting labs and vitals, reviewing the medical record, coordinating care amongst consultants and care team members, directly assessing and discussing care with the patient and/or family: 55 min  Tatelyn Vanhecke, DO [facility]  To contact the attending physician between 7A-7P please use Epic Chat. To contact the covering physician during after hours 7P-7A, please review Amion.  04/13/2024, 8:23 AM   *This document has been created with the assistance of dictation software. Please excuse typographical errors. *

## 2024-04-14 ENCOUNTER — Observation Stay: Admit: 2024-04-14 | Discharge: 2024-04-14 | Disposition: A | Attending: Internal Medicine

## 2024-04-14 DIAGNOSIS — Z639 Problem related to primary support group, unspecified: Secondary | ICD-10-CM | POA: Diagnosis not present

## 2024-04-14 DIAGNOSIS — N1831 Chronic kidney disease, stage 3a: Secondary | ICD-10-CM | POA: Diagnosis not present

## 2024-04-14 DIAGNOSIS — W19XXXA Unspecified fall, initial encounter: Secondary | ICD-10-CM | POA: Diagnosis not present

## 2024-04-14 DIAGNOSIS — I739 Peripheral vascular disease, unspecified: Secondary | ICD-10-CM | POA: Diagnosis not present

## 2024-04-14 DIAGNOSIS — D472 Monoclonal gammopathy: Secondary | ICD-10-CM | POA: Diagnosis not present

## 2024-04-14 DIAGNOSIS — R531 Weakness: Secondary | ICD-10-CM

## 2024-04-14 DIAGNOSIS — I10 Essential (primary) hypertension: Secondary | ICD-10-CM | POA: Diagnosis not present

## 2024-04-14 DIAGNOSIS — G929 Unspecified toxic encephalopathy: Secondary | ICD-10-CM | POA: Insufficient documentation

## 2024-04-14 DIAGNOSIS — G459 Transient cerebral ischemic attack, unspecified: Secondary | ICD-10-CM | POA: Diagnosis not present

## 2024-04-14 DIAGNOSIS — E669 Obesity, unspecified: Secondary | ICD-10-CM | POA: Diagnosis not present

## 2024-04-14 DIAGNOSIS — R29898 Other symptoms and signs involving the musculoskeletal system: Secondary | ICD-10-CM | POA: Diagnosis not present

## 2024-04-14 DIAGNOSIS — Y92009 Unspecified place in unspecified non-institutional (private) residence as the place of occurrence of the external cause: Secondary | ICD-10-CM | POA: Diagnosis not present

## 2024-04-14 LAB — ECHOCARDIOGRAM COMPLETE
AR max vel: 4.19 cm2
AV Area VTI: 4.23 cm2
AV Area mean vel: 4.13 cm2
AV Mean grad: 3 mmHg
AV Peak grad: 4.9 mmHg
Ao pk vel: 1.11 m/s
Area-P 1/2: 2.99 cm2
Height: 73 in
MV VTI: 3.08 cm2
S' Lateral: 3 cm
Weight: 3945.6 [oz_av]

## 2024-04-14 MED ORDER — FENTANYL CITRATE (PF) 50 MCG/ML IJ SOSY
12.5000 ug | PREFILLED_SYRINGE | INTRAMUSCULAR | Status: DC | PRN
  Administered 2024-04-14: 12.5 ug via INTRAVENOUS
  Filled 2024-04-14: qty 1

## 2024-04-14 MED ORDER — PREDNISONE 50 MG PO TABS
50.0000 mg | ORAL_TABLET | Freq: Every day | ORAL | Status: AC
  Administered 2024-04-15 – 2024-04-17 (×3): 50 mg via ORAL
  Filled 2024-04-14 (×3): qty 1

## 2024-04-14 MED ORDER — LEVOTHYROXINE SODIUM 50 MCG PO TABS
150.0000 ug | ORAL_TABLET | Freq: Every day | ORAL | Status: DC
  Administered 2024-04-15 – 2024-04-18 (×4): 150 ug via ORAL
  Filled 2024-04-14 (×4): qty 1

## 2024-04-14 NOTE — Assessment & Plan Note (Signed)
 Chronic neuropathy.  On gabapentin .  Will continue.

## 2024-04-14 NOTE — Assessment & Plan Note (Signed)
 Renal function at baseline, creatinine 1.3. Avoid nephrotoxic medication. Receiving IV hydration as clinically appears to be dehydrated.

## 2024-04-14 NOTE — Assessment & Plan Note (Signed)
-  Differential as above - Continue PT/OT - No injury currently - Also suffered a fall while admitted on day 1.  Head CT negative.

## 2024-04-14 NOTE — Assessment & Plan Note (Signed)
 TSH elevated, will increase home dose Synthroid 

## 2024-04-14 NOTE — Progress Notes (Signed)
 Occupational Therapy Treatment Patient Details Name: Marc Gray MRN: [id number] DOB: [birth year] Today's Date: 04/14/2024   History of present illness Pt is a 78 y/o M admitted on 04/12/24 after presenting with c/o L sided weakness. Head CT negative for acute issues, MRI pending. PMH: CAD, prior MI, PVD, CKD, lung CA in remission   OT comments  Upon entering the room, pt seated on EOB and agreeable to therapeutic intervention. Upon entering the room, pt seated on EOB. He stands and ambulates 100' with RW with CGA- min A and needing chair follow for safety. Pt drifts in RW with fatigue. He does take seated rest break halfway. Pt returning to sit in recliner chair in room. Chair alarm activated and call bell and all needed items within reach.       If plan is discharge home, recommend the following:  A little help with walking and/or transfers;A little help with bathing/dressing/bathroom;Assistance with cooking/housework;Assist for transportation;Help with stairs or ramp for entrance         Precautions / Restrictions Precautions Precautions: Fall Recall of Precautions/Restrictions: Intact       Mobility Bed Mobility               General bed mobility comments: seated on EOB on arrival and in recliner at end of session    Transfers Overall transfer level: Needs assistance Equipment used: Rolling walker (2 wheels) Transfers: Sit to/from Stand Sit to Stand: Contact guard assist     Step pivot transfers: Contact guard assist           Balance Overall balance assessment: Needs assistance Sitting-balance support: Feet supported       Standing balance support: During functional activity, No upper extremity supported Standing balance-Leahy Scale: Poor                             ADL either performed or assessed with clinical judgement   ADL Overall ADL's : Needs assistance/impaired                         Toilet Transfer:  Contact guard assist;Rolling walker (2 wheels)                  Extremity/Trunk Assessment Upper Extremity Assessment Upper Extremity Assessment: Generalized weakness   Lower Extremity Assessment Lower Extremity Assessment: Generalized weakness        Vision Patient Visual Report: No change from baseline           Communication Communication Communication: No apparent difficulties   Cognition Arousal: Alert Behavior During Therapy: WFL for tasks assessed/performed                                 Following commands: Intact        Cueing   Cueing Techniques: Verbal cues             Pertinent Vitals/ Pain       Pain Assessment Pain Assessment: No/denies pain         Frequency  Min 2X/week        Progress Toward Goals  OT Goals(current goals can now be found in the care plan section)  Progress towards OT goals: Progressing toward goals      AM-PAC OT 6 Clicks Daily Activity     Outcome Measure   Help from another  person eating meals?: None Help from another person taking care of personal grooming?: None Help from another person toileting, which includes using toliet, bedpan, or urinal?: A Little Help from another person bathing (including washing, rinsing, drying)?: A Little Help from another person to put on and taking off regular upper body clothing?: A Little Help from another person to put on and taking off regular lower body clothing?: A Little 6 Click Score: 20    End of Session Equipment Utilized During Treatment: Gait belt  OT Visit Diagnosis: Muscle weakness (generalized) (M62.81)   Activity Tolerance Patient tolerated treatment well   Patient Left in chair;with call bell/phone within reach;with bed alarm set   Nurse Communication Mobility status        Time: 1050-1108 OT Time Calculation (min): 18 min  Charges: OT General Charges $OT Visit: 1 Visit  Izetta Claude, MS, OTR/L , CBIS ascom [phone]   04/14/24, 2:51 PM

## 2024-04-14 NOTE — Assessment & Plan Note (Signed)
-  At home takes 10 mg prednisone  in a.m. and 2.5 in PM. - Did not recently increase his steroid while he was feeling weak - Current generalized weakness could be a component of adrenal insufficiency - Currently on IV steroids for COPD exacerbation as above.  Tapering to p.o. prednisone  tomorrow

## 2024-04-14 NOTE — Assessment & Plan Note (Signed)
-  This is multifactorial given his chronic conditions including COPD, adrenal insufficiency, adenocarcinoma, MGUS, PAD, chronic pain.  Acutely worse than baseline at this time - Working with PT/OT and recommending SNF at DC.  TOC consulted

## 2024-04-14 NOTE — Progress Notes (Signed)
*  PRELIMINARY RESULTS* Echocardiogram 2D Echocardiogram has been performed.  Floydene Harder 04/14/2024, 9:20 AM

## 2024-04-14 NOTE — Assessment & Plan Note (Signed)
 Multiple family members involved in patient's care, complex social relationships amongst each other. - Currently patient's primary caregiver is his wife who appears to have some degree of dementia and is not an appropriate person to be administering his medications.  I have discussed care directly with his daughter and ex-wife as well (at request of patient) - TOC consulted for social work assistance.

## 2024-04-14 NOTE — Progress Notes (Signed)
 Physical Therapy Treatment Patient Details Name: Marc Gray MRN: [id number] DOB: 12-12-1945 Today's Date: 04/14/2024   History of Present Illness Pt is a 78 y/o M admitted on 04/12/24 after presenting with c/o L sided weakness. Head CT negative for acute issues, MRI pending. PMH: CAD, prior MI, PVD, CKD, lung CA in remission    PT Comments  Patient seated on EOB on arrival and agreeable to PT treatment session. Stood from EOB with CGA with use of momentum to stand. Initial posterior bias upon standing but able to self correct. Ambulated ~100' with RW and CGA-minA + chair follow for safety 2/2 known falls in hospital due to R knee buckling. No apparent buckling noted during ambulation, however patient drifting L outside of RW at times requiring cues. Discharge plan remains appropriate. Remains a high fall risk 2/2 R LE weakness.      If plan is discharge home, recommend the following: A little help with bathing/dressing/bathroom;A little help with walking and/or transfers;Assistance with cooking/housework;Assist for transportation;Help with stairs or ramp for entrance   Can travel by private vehicle     Yes  Equipment Recommendations  None recommended by PT    Recommendations for Other Services       Precautions / Restrictions Precautions Precautions: Fall Recall of Precautions/Restrictions: Intact Restrictions Weight Bearing Restrictions Per Provider Order: No     Mobility  Bed Mobility               General bed mobility comments: seated on EOB on arrival and in recliner at end of session    Transfers Overall transfer level: Needs assistance Equipment used: Rolling Karmah Potocki (2 wheels) Transfers: Sit to/from Stand Sit to Stand: Contact guard assist           General transfer comment: use of momentum to come into standing with initial posterior bias    Ambulation/Gait Ambulation/Gait assistance: Contact guard assist, Min assist Gait Distance  (Feet): 100 Feet Assistive device: Rolling Khaleef Ruby (2 wheels) Gait Pattern/deviations: Step-through pattern, Decreased stride length, Trunk flexed Gait velocity: decreased     General Gait Details: initially with L lateral and posterior bias but able to self correct. No evidence of R knee buckling during ambulation but patient drifting L with occasional L lateral lean. Cues for maintain close proximity in RW. Assist for balance   Stairs             Wheelchair Mobility     Tilt Bed    Modified Rankin (Stroke Patients Only)       Balance Overall balance assessment: Needs assistance Sitting-balance support: Feet supported Sitting balance-Leahy Scale: Good     Standing balance support: During functional activity, No upper extremity supported Standing balance-Leahy Scale: Poor                              Communication Communication Communication: No apparent difficulties  Cognition Arousal: Alert Behavior During Therapy: WFL for tasks assessed/performed   PT - Cognitive impairments: No apparent impairments                         Following commands: Intact      Cueing Cueing Techniques: Verbal cues  Exercises      General Comments        Pertinent Vitals/Pain Pain Assessment Pain Assessment: No/denies pain    Home Living  Prior Function            PT Goals (current goals can now be found in the care plan section) Acute Rehab PT Goals Patient Stated Goal: none stated PT Goal Formulation: With patient Time For Goal Achievement: 04/26/24 Potential to Achieve Goals: Good Progress towards PT goals: Progressing toward goals    Frequency    Min 2X/week      PT Plan      Co-evaluation              AM-PAC PT 6 Clicks Mobility   Outcome Measure  Help needed turning from your back to your side while in a flat bed without using bedrails?: None Help needed moving from lying on  your back to sitting on the side of a flat bed without using bedrails?: A Little Help needed moving to and from a bed to a chair (including a wheelchair)?: A Little Help needed standing up from a chair using your arms (e.g., wheelchair or bedside chair)?: A Little Help needed to walk in hospital room?: A Little Help needed climbing 3-5 steps with a railing? : A Little 6 Click Score: 19    End of Session Equipment Utilized During Treatment: Gait belt Activity Tolerance: Patient tolerated treatment well Patient left: in chair;with call bell/phone within reach;with chair alarm set Nurse Communication: Mobility status PT Visit Diagnosis: Unsteadiness on feet (R26.81);Muscle weakness (generalized) (M62.81);Difficulty in walking, not elsewhere classified (R26.2);Other abnormalities of gait and mobility (R26.89)     Time: 8950-8891 PT Time Calculation (min) (ACUTE ONLY): 19 min  Charges:    $Therapeutic Activity: 8-22 mins PT General Charges $$ ACUTE PT VISIT: 1 Visit                     Maryanne Finder, PT, DPT Physical Therapist - [facility]  [facility]    Bernie Fobes A Amelya Mabry 04/14/2024, 1:39 PM

## 2024-04-14 NOTE — NC FL2 (Signed)
 [place]  MEDICAID FL2 LEVEL OF CARE FORM     IDENTIFICATION  Patient Name: Marc Gray Birthdate: Mar 20, 1946 Sex: male Admission Date (Current Location): 04/12/2024  [facility] and [place] Number:  Chiropodist and Address:         Provider Number: [id number]  Attending Physician Name and Address:  Leesa Kast, DO  Relative Name and Phone Number:       Current Level of Care: Hospital Recommended Level of Care: Skilled Nursing Facility Prior Approval Number:    Date Approved/Denied:   PASRR Number: [id number]  Discharge Plan: SNF    Current Diagnoses: Patient Active Problem List   Diagnosis Date Noted   Toxic encephalopathy 04/14/2024   Weakness generalized 04/14/2024   Family dynamics problem 04/13/2024   CVA (cerebral vascular accident) (HCC) 04/12/2024   Left leg weakness 04/12/2024   Fall at home, initial encounter 04/12/2024   COPD with acute exacerbation (HCC) 04/12/2024   Obesity (BMI 30-39.9) 04/12/2024   Depression 12/04/2023   Primary hypertension 07/24/2023   Sepsis (HCC) 07/23/2023   Pulmonary emphysema (HCC) 07/23/2023   LLL pneumonia 07/22/2023   Pneumonia 07/22/2023   Lumbar spondylosis 06/07/2023   Degeneration of intervertebral disc of lumbar region with discogenic back pain and lower extremity pain 06/07/2023   Thoracic compression fracture, sequela 06/07/2023   Chronic left-sided low back pain with left-sided sciatica 06/07/2023   Chronic pain syndrome 06/07/2023   Ascending aortic aneurysm 05/06/2023   Descending thoracic aortic aneurysm 05/06/2023   PAD (peripheral artery disease) 05/06/2023   Nocturnal leg cramps 05/06/2023   IDA (iron  deficiency anemia) 03/28/2023   Stage 3a chronic kidney disease (HCC) 12/28/2022   NSTEMI (non-ST elevated myocardial infarction) (HCC) 12/28/2022   MGUS (monoclonal gammopathy of unknown significance) 03/28/2022   History of prostate cancer 03/28/2022   Aortic  atherosclerosis 08/05/2019   Anxiety associated with cancer diagnosis (HCC) 02/06/2019   Adrenal insufficiency 11/28/2017   Constipation, acute 11/19/2017   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   Chronic bacterial conjunctivitis of both eyes 09/14/2017   Chest pain, unspecified 07/03/2017   SOB (shortness of breath) 07/03/2017   Dehydration 05/18/2017   Adenocarcinoma of left lung, stage 3 (HCC) 02/14/2017   Benign neoplasm of colon, unspecified 02/24/2014    Orientation RESPIRATION BLADDER Height & Weight     Self, Time, Situation, Place  Normal Continent Weight: 111.9 kg Height:  6' 1 (185.4 cm)  BEHAVIORAL SYMPTOMS/MOOD NEUROLOGICAL BOWEL NUTRITION STATUS      Continent Diet  AMBULATORY STATUS COMMUNICATION OF NEEDS Skin   Limited Assist   Normal                       Personal Care Assistance Level of Assistance              Functional Limitations Info             SPECIAL CARE FACTORS FREQUENCY  PT (By licensed PT), OT (By licensed OT)     PT Frequency: 3X/Week OT Frequency: 3X/Week            Contractures Contractures Info: Not present    Additional Factors Info  Code Status Code Status Info: Full             Current Medications (04/14/2024):  This is the current hospital active medication list Current Facility-Administered Medications  Medication Dose Route Frequency Provider Last Rate Last Admin   acetaminophen  (TYLENOL ) tablet 650 mg  650 mg  Oral Q4H PRN Patel, Pranav M, MD       Or   acetaminophen  (TYLENOL ) 160 MG/5ML solution 650 mg  650 mg Per Tube Q4H PRN Patel, Pranav M, MD       Or   acetaminophen  (TYLENOL ) suppository 650 mg  650 mg Rectal Q4H PRN Patel, Pranav M, MD       aspirin  EC tablet 81 mg  81 mg Oral Daily Patel, Pranav M, MD   81 mg at 04/14/24 9041   benzonatate  (TESSALON ) capsule 100 mg  100 mg Oral TID PRN Patel, Pranav M, MD       enoxaparin  (LOVENOX ) injection 55 mg  0.5 mg/kg Subcutaneous Q24H Hallaji,  Sheema M, RPH   55 mg at 04/14/24 0957   fentaNYL  (SUBLIMAZE ) injection 12.5 mcg  12.5 mcg Intravenous Q4H PRN Mansy, Jan A, MD   12.5 mcg at 04/14/24 0110   gabapentin  (NEURONTIN ) capsule 100 mg  100 mg Oral TID Patel, Pranav M, MD   100 mg at 04/14/24 1606   [START ON 04/15/2024] levothyroxine  (SYNTHROID ) tablet 150 mcg  150 mcg Oral QAC breakfast Dezii, Alexandra, DO       pantoprazole  (PROTONIX ) EC tablet 40 mg  40 mg Oral BID AC Patel, Pranav M, MD   40 mg at 04/14/24 1606   [START ON 04/15/2024] predniSONE  (DELTASONE ) tablet 50 mg  50 mg Oral Q breakfast Dezii, Alexandra, DO       rosuvastatin  (CRESTOR ) tablet 10 mg  10 mg Oral Daily Patel, Pranav M, MD   10 mg at 04/14/24 9041   senna-docusate (Senokot-S) tablet 1 tablet  1 tablet Oral QHS PRN Patel, Pranav M, MD       tamsulosin  (FLOMAX ) capsule 0.4 mg  0.4 mg Oral Daily Patel, Pranav M, MD   0.4 mg at 04/14/24 9041   traZODone  (DESYREL ) tablet 100 mg  100 mg Oral QHS Patel, Pranav M, MD   100 mg at 04/13/24 2126   Facility-Administered Medications Ordered in Other Encounters  Medication Dose Route Frequency Provider Last Rate Last Admin   sodium chloride  flush (NS) 0.9 % injection 10 mL  10 mL Intravenous PRN Babara Call, MD   10 mL at 04/03/17 9166     Discharge Medications: Please see discharge summary for a list of discharge medications.  Relevant Imaging Results:  Relevant Lab Results:   Additional Information: SS#: [id number]    Victory Jackquline RAMAN, RN

## 2024-04-14 NOTE — Assessment & Plan Note (Signed)
-   Continue outpatient follow-up with oncology

## 2024-04-14 NOTE — Assessment & Plan Note (Signed)
 Body mass index is 32.53 kg/m.  Placing the patient at high risk for poor outcome. Possibility of OSA cannot be ruled out.

## 2024-04-14 NOTE — Progress Notes (Signed)
 PROGRESS NOTE    Marc Gray  FMW:[id number] DOB: Apr 10, 1946 DOA: 04/12/2024 PCP: Valora Lynwood FALCON, MD  Chief Complaint  Patient presents with   [facility] Course:  [name] left lung cancer, prostate cancer, MGUS, hypothyroidism, COPD, eosinophilic asthma, adrenal insufficiency, CKD stage IIIA, CAD, PAD, CVA, iron  deficiency anemia on IV iron , who presented to the hospital with generalized weakness.   He was recently seen in the ED 11/11 for conjunctivitis.  Has been taking OTC antihistamines 4 times daily. Reports last time he felt like normal was 11/12. He endorses intermittent weakness in his left leg and right arm, no tingling or numbness.  He has been walking with a walker.  His weakness has been progressive to the point that he is not having difficulty ambulating. On arrival to the ED he was noted to have left eye ptosis but was unable to report how long it had been that way.  He does take a daily aspirin . In the ED vital signs and labs mostly unremarkable.  CXR without acute process Respiratory viral panel negative.  Subjective: No acute events overnight.  Patient is much more alert today.  We had extensive discussion regarding home health versus SNF at discharge.  Reportedly took two-person assist to stand up earlier.  PT recommended SNF.  He is hesitant to go to skilled nursing facility but also endorses that his wife is unable to take care of him consistently at home.  He is amendable to SNF  Objective: Vitals:   04/13/24 1946 04/14/24 0012 04/14/24 0400 04/14/24 0838  BP: (!) 152/84 129/84 (!) 146/99 135/77  Pulse: 82 79 81 79  Resp: 18 18 18 18   Temp: 97.9 F (36.6 C) 98 F (36.7 C) 99.2 F (37.3 C) 97.7 F (36.5 C)  TempSrc: Oral Oral Oral Oral  SpO2: 93% 92% 95% 92%  Weight:      Height:        Intake/Output Summary (Last 24 hours) at 04/14/2024 1511 Last data filed at 04/14/2024 0900 Gross per 24 hour  Intake 240 ml   Output --  Net 240 ml   Filed Weights   04/12/24 0334  Weight: 111.9 kg    Examination: General exam: No acute distress Respiratory system: No work of breathing, symmetric chest wall expansion Cardiovascular system: S1 & S2 heard, RRR.  Gastrointestinal system: Abdomen is obese, soft and nontender Neuro: Alert and oriented x 3  Assessment & Plan:  Active Problems:   Adenocarcinoma of left lung, stage 3 (HCC)   Hypothyroidism due to medication   Adrenal insufficiency   MGUS (monoclonal gammopathy of unknown significance)   History of prostate cancer   Stage 3a chronic kidney disease (HCC)   Ascending aortic aneurysm   PAD (peripheral artery disease)   Chronic pain syndrome   Primary hypertension   Depression   Left leg weakness   Fall at home, initial encounter   COPD with acute exacerbation (HCC)   Obesity (BMI 30-39.9)   Family dynamics problem    Assessment & Plan Toxic encephalopathy -Now resolved - Appears that patient has been suffering from polypharmacy outpatient, exacerbated by his wife who was likely over providing antihistamine therapy including providing Benadryl  unknown to us  while in the hospital - All home medications have been removed from the bed - Patient is now alert and oriented. Weakness generalized -This is multifactorial given his chronic conditions including COPD, adrenal insufficiency, adenocarcinoma, MGUS, PAD, chronic pain.  Acutely  worse than baseline at this time - Working with PT/OT and recommending SNF at DC.  TOC consulted Left leg weakness -Presents with a left leg weakness intermittently occurring for last 48 hours.  On chart review it appears he has had lumbar radiculopathy for at least a year, has seen neurosurgery in clinic 2024 -Brain MRI without acute intracranial abnormality.  Moderate generalized atrophy with white matter disease which has progressed since prior MRI - Lumbar MRI consistent with degenerative changes and stenosis  not drastically worse than 2024 - No acute pain at this time. - PT/OT recommending SNF - [facility] consulted for SNF placement COPD with acute exacerbation (HCC) -Recently seen for rhinorrhea.  Has some cough.  Wheezing on exam - Not hypoxic, no supplemental O2 needed at this time - CXR without evidence of pneumonia - Respiratory viral panel negative.  Flu/COVID/RSV negative - Continue steroids, DuoNebs for now. - Initiate steroid taper tomorrow - No evidence of bacterial pneumonia.  Discontinued antibiotics - Reiterated the importance of discontinuing all antihistamines  Adrenal insufficiency -At home takes 10 mg prednisone  in a.m. and 2.5 in PM. - Did not recently increase his steroid while he was feeling weak - Current generalized weakness could be a component of adrenal insufficiency - Currently on IV steroids for COPD exacerbation as above.  Tapering to p.o. prednisone  tomorrow Fall at home, initial encounter -Differential as above - Continue PT/OT - No injury currently - Also suffered a fall while admitted on day 1.  Head CT negative. Hypothyroidism due to medication TSH elevated, will increase home dose Synthroid  Adenocarcinoma of left lung, stage 3 (HCC) - Continue outpatient follow-up with oncology. MGUS (monoclonal gammopathy of unknown significance) Continue outpatient follow-up with oncology History of prostate cancer On Flomax . History of TURP in 2020. Stage 3a chronic kidney disease (HCC) Renal function at baseline, creatinine 1.3. Avoid nephrotoxic medication. Receiving IV hydration as clinically appears to be dehydrated. Ascending aortic aneurysm Noted. PAD (peripheral artery disease) On 81 mg aspirin .  For now we will continue. Chronic pain syndrome Chronic neuropathy.  On gabapentin .  Will continue. Primary hypertension On metoprolol  at home.  Also on Lasix  20 mg daily at home.  Clinically dehydrated. Currently holding. Depression On trazodone  at home  nightly. Continue. Obesity (BMI 30-39.9) Body mass index is 32.53 kg/m.  Placing the patient at high risk for poor outcome. Possibility of OSA cannot be ruled out. Family dynamics problem Multiple family members involved in patient's care, complex social relationships amongst each other. - Currently patient's primary caregiver is his wife who appears to have some degree of dementia and is not an appropriate person to be administering his medications.  I have discussed care directly with his daughter and ex-wife as well (at request of patient) - TOC consulted for social work assistance. Obesity Class 1 - Outpatient follow up for lifestyle modification and risk factor management   DVT prophylaxis: Lovenox    Code Status: Full Code Disposition: Medically ready for discharge pending SNF placement Consultants:    Procedures:    Antimicrobials:  Anti-infectives (From admission, onward)    Start     Dose/Rate Route Frequency Ordered Stop   04/12/24 1000  doxycycline (VIBRA-TABS) tablet 100 mg  Status:  Discontinued        100 mg Oral Every 12 hours 04/12/24 0757 04/13/24 9167       Data Reviewed: I have personally reviewed following labs and imaging studies CBC: Recent Labs  Lab 04/12/24 0527 04/13/24 0745  WBC 6.6 4.5  NEUTROABS 4.5 2.1  HGB 13.3 12.9*  HCT 41.1 38.9*  MCV 95.4 94.0  PLT 142* 119*   Basic Metabolic Panel: Recent Labs  Lab 04/12/24 0527 04/13/24 0440  NA 140 140  K 4.7 4.4  CL 109 108  CO2 24 21*  GLUCOSE 111* 89  BUN 24* 19  CREATININE 1.38* 1.29*  CALCIUM  9.3 8.9  MG  --  2.0   GFR: Estimated Creatinine Clearance: 62.9 mL/min (A) (by C-G formula based on SCr of 1.29 mg/dL (H)). Liver Function Tests: Recent Labs  Lab 04/12/24 0527 04/13/24 0440  AST 22 22  ALT 20 14  ALKPHOS 45 39  BILITOT 0.3 0.4  PROT 6.1* 5.6*  ALBUMIN 3.9 3.5   CBG: Recent Labs  Lab 04/12/24 0534  GLUCAP 109*    Recent Results (from the past 240 hours)   Respiratory (~20 pathogens) panel by PCR     Status: None   Collection Time: 04/12/24 12:00 PM   Specimen: Nasopharyngeal Swab; Respiratory  Result Value Ref Range Status   Adenovirus NOT DETECTED NOT DETECTED Final   Coronavirus 229E NOT DETECTED NOT DETECTED Final    Comment: (NOTE) The Coronavirus on the Respiratory Panel, DOES NOT test for the novel  Coronavirus (2019 nCoV)    Coronavirus HKU1 NOT DETECTED NOT DETECTED Final   Coronavirus NL63 NOT DETECTED NOT DETECTED Final   Coronavirus OC43 NOT DETECTED NOT DETECTED Final   Metapneumovirus NOT DETECTED NOT DETECTED Final   Rhinovirus / Enterovirus NOT DETECTED NOT DETECTED Final   Influenza A NOT DETECTED NOT DETECTED Final   Influenza B NOT DETECTED NOT DETECTED Final   Parainfluenza Virus 1 NOT DETECTED NOT DETECTED Final   Parainfluenza Virus 2 NOT DETECTED NOT DETECTED Final   Parainfluenza Virus 3 NOT DETECTED NOT DETECTED Final   Parainfluenza Virus 4 NOT DETECTED NOT DETECTED Final   Respiratory Syncytial Virus NOT DETECTED NOT DETECTED Final   Bordetella pertussis NOT DETECTED NOT DETECTED Final   Bordetella Parapertussis NOT DETECTED NOT DETECTED Final   Chlamydophila pneumoniae NOT DETECTED NOT DETECTED Final   Mycoplasma pneumoniae NOT DETECTED NOT DETECTED Final    Comment: Performed at [facility], [address] [place], [place] [address]  Resp panel by RT-PCR (RSV, Flu A&B, Covid) Anterior Nasal Swab     Status: None   Collection Time: 04/13/24 12:15 PM   Specimen: Anterior Nasal Swab  Result Value Ref Range Status   SARS Coronavirus 2 by RT PCR NEGATIVE NEGATIVE Final    Comment: (NOTE) SARS-CoV-2 target nucleic acids are NOT DETECTED.  The SARS-CoV-2 RNA is generally detectable in upper respiratory specimens during the acute phase of infection. The lowest concentration of SARS-CoV-2 viral copies this assay can detect is 138 copies/mL. A negative result does not preclude SARS-Cov-2 infection and  should not be used as the sole basis for treatment or other patient management decisions. A negative result may occur with  improper specimen collection/handling, submission of specimen other than nasopharyngeal swab, presence of viral mutation(s) within the areas targeted by this assay, and inadequate number of viral copies(<138 copies/mL). A negative result must be combined with clinical observations, patient history, and epidemiological information. The expected result is Negative.  Fact Sheet for Patients:  bloggercourse.com  Fact Sheet for Healthcare Providers:  seriousbroker.it  This test is no t yet approved or cleared by the [place]  FDA and  has been authorized for detection and/or diagnosis of SARS-CoV-2 by FDA under an Emergency  Use Authorization (EUA). This EUA will remain  in effect (meaning this test can be used) for the duration of the COVID-19 declaration under Section 564(b)(1) of the Act, 21 U.S.C.section 360bbb-3(b)(1), unless the authorization is terminated  or revoked sooner.       Influenza A by PCR NEGATIVE NEGATIVE Final   Influenza B by PCR NEGATIVE NEGATIVE Final    Comment: (NOTE) The Xpert Xpress SARS-CoV-2/FLU/RSV plus assay is intended as an aid in the diagnosis of influenza from Nasopharyngeal swab specimens and should not be used as a sole basis for treatment. Nasal washings and aspirates are unacceptable for Xpert Xpress SARS-CoV-2/FLU/RSV testing.  Fact Sheet for Patients: bloggercourse.com  Fact Sheet for Healthcare Providers: seriousbroker.it  This test is not yet approved or cleared by the [place]  FDA and has been authorized for detection and/or diagnosis of SARS-CoV-2 by FDA under an Emergency Use Authorization (EUA). This EUA will remain in effect (meaning this test can be used) for the duration of the COVID-19 declaration  under Section 564(b)(1) of the Act, 21 U.S.C. section 360bbb-3(b)(1), unless the authorization is terminated or revoked.     Resp Syncytial Virus by PCR NEGATIVE NEGATIVE Final    Comment: (NOTE) Fact Sheet for Patients: bloggercourse.com  Fact Sheet for Healthcare Providers: seriousbroker.it  This test is not yet approved or cleared by the [place]  FDA and has been authorized for detection and/or diagnosis of SARS-CoV-2 by FDA under an Emergency Use Authorization (EUA). This EUA will remain in effect (meaning this test can be used) for the duration of the COVID-19 declaration under Section 564(b)(1) of the Act, 21 U.S.C. section 360bbb-3(b)(1), unless the authorization is terminated or revoked.  Performed at [facility], [address], [place], [place] [address]      Radiology Studies: ECHOCARDIOGRAM COMPLETE Result Date: 04/14/2024    ECHOCARDIOGRAM REPORT   Patient Name:   Eswin Worrell Gray Date of Exam: 04/14/2024 Medical Rec #:  [id number]                    Height:       73.0 in Accession #:    [id number]                   Weight:       246.6 lb Date of Birth:  February 17, 1946                     BSA:          2.352 m Patient Age:    78 years                     BP:           104/39 mmHg Patient Gender: M                            HR:           82 bpm. Exam Location:  [facility] Procedure: 2D Echo, Cardiac Doppler, Color Doppler and Saline Contrast Bubble            Study (Both Spectral and Color Flow Doppler were utilized during            procedure). Indications:     Syncope R55  History:         Patient has no prior history of Echocardiogram examinations.  COPD, Signs/Symptoms:Dyspnea; Risk Factors:Diabetes.  Sonographer:     Christopher Furnace Referring Phys:  [id number] [facility] M PATEL Diagnosing Phys: Cara JONETTA Lovelace MD  Sonographer Comments: Image acquisition challenging due to COPD. IMPRESSIONS  1. Left  ventricular ejection fraction, by estimation, is 65 to 70%. The left ventricle has normal function. The left ventricle has no regional wall motion abnormalities. There is mild asymmetric left ventricular hypertrophy of the basal and inferior segments. Left ventricular diastolic parameters are consistent with Grade I diastolic dysfunction (impaired relaxation).  2. Right ventricular systolic function is normal. The right ventricular size is normal.  3. The mitral valve is normal in structure. No evidence of mitral valve regurgitation.  4. The aortic valve is normal in structure. Aortic valve regurgitation is not visualized. Aortic valve sclerosis is present, with no evidence of aortic valve stenosis. FINDINGS  Left Ventricle: Left ventricular ejection fraction, by estimation, is 65 to 70%. The left ventricle has normal function. The left ventricle has no regional wall motion abnormalities. Strain was performed and the global longitudinal strain is indeterminate. The left ventricular internal cavity size was normal in size. There is mild asymmetric left ventricular hypertrophy of the basal and inferior segments. Left ventricular diastolic parameters are consistent with Grade I diastolic dysfunction  (impaired relaxation). Right Ventricle: The right ventricular size is normal. No increase in right ventricular wall thickness. Right ventricular systolic function is normal. Left Atrium: Left atrial size was normal in size. Right Atrium: Right atrial size was normal in size. Pericardium: There is no evidence of pericardial effusion. Mitral Valve: The mitral valve is normal in structure. No evidence of mitral valve regurgitation. MV peak gradient, 4.6 mmHg. The mean mitral valve gradient is 3.0 mmHg. Tricuspid Valve: The tricuspid valve is normal in structure. Tricuspid valve regurgitation is not demonstrated. Aortic Valve: The aortic valve is normal in structure. Aortic valve regurgitation is not visualized. Aortic valve  sclerosis is present, with no evidence of aortic valve stenosis. Aortic valve mean gradient measures 3.0 mmHg. Aortic valve peak gradient measures 4.9 mmHg. Aortic valve area, by VTI measures 4.23 cm. Pulmonic Valve: The pulmonic valve was normal in structure. Pulmonic valve regurgitation is not visualized. Aorta: The ascending aorta was not well visualized. IAS/Shunts: No atrial level shunt detected by color flow Doppler. Agitated saline contrast was given intravenously to evaluate for intracardiac shunting. Additional Comments: 3D was performed not requiring image post processing on an independent workstation and was indeterminate.  LEFT VENTRICLE PLAX 2D LVIDd:         4.80 cm   Diastology LVIDs:         3.00 cm   LV e' medial:    4.35 cm/s LV PW:         1.30 cm   LV E/e' medial:  16.6 LV IVS:        1.00 cm   LV e' lateral:   6.64 cm/s LVOT diam:     2.30 cm   LV E/e' lateral: 10.9 LV SV:         95 LV SV Index:   40 LVOT Area:     4.15 cm  RIGHT VENTRICLE RV Basal diam:  4.40 cm RV Mid diam:    3.60 cm LEFT ATRIUM           Index        RIGHT ATRIUM           Index LA diam:      3.00 cm 1.28  cm/m   RA Area:     29.50 cm LA Vol (A4C): 77.5 ml 32.95 ml/m  RA Volume:   117.00 ml 49.75 ml/m  AORTIC VALVE AV Area (Vmax):    4.19 cm AV Area (Vmean):   4.13 cm AV Area (VTI):     4.23 cm AV Vmax:           111.00 cm/s AV Vmean:          72.200 cm/s AV VTI:            0.225 m AV Peak Grad:      4.9 mmHg AV Mean Grad:      3.0 mmHg LVOT Vmax:         112.00 cm/s LVOT Vmean:        71.800 cm/s LVOT VTI:          0.229 m LVOT/AV VTI ratio: 1.02  AORTA Ao Root diam: 3.90 cm MITRAL VALVE               TRICUSPID VALVE MV Area (PHT): 2.99 cm    TR Peak grad:   7.3 mmHg MV Area VTI:   3.08 cm    TR Vmax:        135.00 cm/s MV Peak grad:  4.6 mmHg MV Mean grad:  3.0 mmHg    SHUNTS MV Vmax:       1.07 m/s    Systemic VTI:  0.23 m MV Vmean:      76.8 cm/s   Systemic Diam: 2.30 cm MV Decel Time: 254 msec MV E velocity:  72.30 cm/s MV A velocity: 81.20 cm/s MV E/A ratio:  0.89 Dwayne D Callwood MD Electronically signed by Cara JONETTA Lovelace MD Signature Date/Time: 04/14/2024/1:54:53 PM    Final    MR LUMBAR SPINE WO CONTRAST Result Date: 04/13/2024 EXAM: MRI LUMBAR SPINE 04/13/2024 08:50:52 PM TECHNIQUE: Multiplanar multisequence MRI of the lumbar spine was performed without the administration of intravenous contrast. COMPARISON: MRI lumbar spine 02/27/2023. CLINICAL HISTORY: Lumbar radiculopathy, no red flags, no prior management; known stenosis per MRI 2024, acute worsening with gait instability now. HISTORY OF prostate Ca, lung Ca and MGUS FINDINGS: BONES AND ALIGNMENT: Similar approximately 2 mm of degenerative anterolisthesis of L3-L4 and 3 mm of degenerative retrolisthesis of L5 on S1. No new malalignment. Normal vertebral body heights. Mildly heterogeneous bone marrow without specific evidence of acute fracture, discitis/osteomyelitis, or suspicious bone lesion. SPINAL CORD: The conus terminates normally. SOFT TISSUES: No paraspinal mass. L1-L2: Mild disc bulge.  No spinal canal stenosis or neural foraminal narrowing. L2-L3: Mild disc bulge, ligamentum flavum thickening, and facet arthropathy. No spinal canal stenosis or neural foraminal narrowing. L3-L4: Broad disc bulging with moderate facet arthropathy and ligamentum flavum thickening. Similar mild subarticular recess stenosis mild canal stenosis. Similar mild right foraminal stenosis. Patent left foramen. L4-L5: Left eccentric disc bulging and endplate spurring. Bilateral facet arthropathy and ligamentum flavum thickening. Similar left subarticular recess stenosis which could affect the descending left L5 nerve root. Similar mild bilateral foraminal stenosis. Patent central canal. L5-S1: Endplate spurring and disc bulging. Some mild left foraminal stenosis. IMPRESSION: 1. At L4-5, similar left subarticular recess stenosis and mild bilateral foraminal stenosis. 2.  Similar mild foraminal stenosis on the left at L5-S1 and the right at L3-L4. Electronically signed by: Gilmore Molt MD 04/13/2024 09:34 PM EST RP Workstation: [id number]   CT HEAD WO CONTRAST ( ) Result Date: 04/13/2024 EXAM: CT HEAD WITHOUT CONTRAST 04/13/2024 09:00:47 AM TECHNIQUE: CT  of the head was performed without the administration of intravenous contrast. Automated exposure control, iterative reconstruction, and/or weight based adjustment of the mA/kV was utilized to reduce the radiation dose to as low as reasonably achievable. COMPARISON: 04/12/2024 CLINICAL HISTORY: Head trauma, coagulopathy (Age 66-64y); fell from hospital bed- hit his head. r/o SDH FINDINGS: BRAIN AND VENTRICLES: No acute hemorrhage. No evidence of acute infarct. No hydrocephalus. No extra-axial collection. No mass effect or midline shift. Age-related atrophy. Mild periventricular white matter disease. Mild calcific atheromatous disease. ORBITS: Status post bilateral lens replacement. SINUSES: Mild mucosal disease within right frontal sinus. SOFT TISSUES AND SKULL: No acute soft tissue abnormality. No skull fracture. IMPRESSION: 1. No acute intracranial abnormality. 2. Age-related atrophy and mild periventricular white matter disease. Electronically signed by: Evalene Coho MD 04/13/2024 09:37 AM EST RP Workstation: [id number]    Scheduled Meds:  aspirin  EC  81 mg Oral Daily   enoxaparin  (LOVENOX ) injection  0.5 mg/kg Subcutaneous Q24H   gabapentin   100 mg Oral TID   levothyroxine   137 mcg Oral QAC breakfast   methylPREDNISolone  (SOLU-MEDROL ) injection  80 mg Intravenous Q24H   pantoprazole   40 mg Oral BID AC   rosuvastatin   10 mg Oral Daily   tamsulosin   0.4 mg Oral Daily   traZODone   100 mg Oral QHS   Continuous Infusions:     LOS: 0 days  MDM: Patient is high risk for one or more organ failure.  They necessitate ongoing hospitalization for continued IV therapies and subsequent lab monitoring. Total time  spent interpreting labs and vitals, reviewing the medical record, coordinating care amongst consultants and care team members, directly assessing and discussing care with the patient and/or family: 55 min  Akansha Wyche, DO [facility]  To contact the attending physician between 7A-7P please use Epic Chat. To contact the covering physician during after hours 7P-7A, please review Amion.  04/14/2024, 3:11 PM   *This document has been created with the assistance of dictation software. Please excuse typographical errors. *

## 2024-04-14 NOTE — Plan of Care (Signed)
  Problem: Education: Goal: Knowledge of General Education information will improve Description: Including pain rating scale, medication(s)/side effects and non-pharmacologic comfort measures Outcome: Progressing   Problem: Clinical Measurements: Goal: Will remain free from infection Outcome: Progressing   Problem: Nutrition: Goal: Adequate nutrition will be maintained Outcome: Progressing   Problem: Safety: Goal: Ability to remain free from injury will improve Outcome: Progressing   Problem: Ischemic Stroke/TIA Tissue Perfusion: Goal: Complications of ischemic stroke/TIA will be minimized Outcome: Progressing

## 2024-04-14 NOTE — Assessment & Plan Note (Signed)
 On 81 mg aspirin .  For now we will continue.

## 2024-04-14 NOTE — Assessment & Plan Note (Signed)
 On trazodone  at home nightly. Continue.

## 2024-04-14 NOTE — TOC Progression Note (Signed)
 Transition of Care [facility]) - Progression Note    Patient Details  Name: Marc Gray MRN: [id number] Date of Birth: 04-Nov-1945  [facility]) CM/SW Contact  Victory Jackquline RAMAN, RN Phone Number: 04/14/2024, 7:06 PM  Clinical Narrative:   RNCM met with patient, his wife and granddaughter at bedside. RNCM Introduced role and explained that discharge planning would be discussed. PT is recommending STR. Patient is in agreement with STR and would like to go to [facility], [facility] or [facility]. Doesn't want [facility]. Pt lives with his wife and she will pick him up at the time of discharge. Pt has DME at home. [employer], Wheelchair and Walker) FL2 completed, PASSR# [id number], Submitted for [employer]. RNCM will continue to follow for discharge planning needs.      Barriers to Discharge: Continued Medical Work up               Expected Discharge Plan and Services                                               Social Drivers of Health (SDOH) Interventions SDOH Screenings   Food Insecurity: No Food Insecurity (04/12/2024)  Housing: Unknown (04/12/2024)  Transportation Needs: No Transportation Needs (04/12/2024)  Utilities: Not At Risk (04/12/2024)  Depression (PHQ2-9): Low Risk  (12/25/2023)  Recent Concern: Depression (PHQ2-9) - Medium Risk (12/04/2023)  Financial Resource Strain: Low Risk  (11/07/2023)   Received from [facility]  Social Connections: Socially Integrated (04/12/2024)  Tobacco Use: Medium Risk (04/12/2024)    Readmission Risk Interventions     No data to display

## 2024-04-14 NOTE — Assessment & Plan Note (Signed)
-  Recently seen for rhinorrhea.  Has some cough.  Wheezing on exam - Not hypoxic, no supplemental O2 needed at this time - CXR without evidence of pneumonia - Respiratory viral panel negative.  Flu/COVID/RSV negative - Continue steroids, DuoNebs for now. - Initiate steroid taper tomorrow - No evidence of bacterial pneumonia.  Discontinued antibiotics - Reiterated the importance of discontinuing all antihistamines

## 2024-04-14 NOTE — Plan of Care (Signed)

## 2024-04-14 NOTE — Assessment & Plan Note (Signed)
-  Presents with a left leg weakness intermittently occurring for last 48 hours.  On chart review it appears he has had lumbar radiculopathy for at least a year, has seen neurosurgery in clinic 2024 -Brain MRI without acute intracranial abnormality.  Moderate generalized atrophy with white matter disease which has progressed since prior MRI - Lumbar MRI consistent with degenerative changes and stenosis not drastically worse than 2024 - No acute pain at this time. - PT/OT recommending SNF - [facility] consulted for SNF placement

## 2024-04-14 NOTE — Assessment & Plan Note (Signed)
 On metoprolol  at home.  Also on Lasix  20 mg daily at home.  Clinically dehydrated. Currently holding.

## 2024-04-14 NOTE — Assessment & Plan Note (Signed)
-  Now resolved - Appears that patient has been suffering from polypharmacy outpatient, exacerbated by his wife who was likely over providing antihistamine therapy including providing Benadryl  unknown to us  while in the hospital - All home medications have been removed from the bed - Patient is now alert and oriented.

## 2024-04-14 NOTE — Assessment & Plan Note (Signed)
 Noted

## 2024-04-14 NOTE — Assessment & Plan Note (Signed)
 On Flomax . History of TURP in 2020.

## 2024-04-15 DIAGNOSIS — N1831 Chronic kidney disease, stage 3a: Secondary | ICD-10-CM | POA: Diagnosis not present

## 2024-04-15 DIAGNOSIS — I739 Peripheral vascular disease, unspecified: Secondary | ICD-10-CM | POA: Diagnosis not present

## 2024-04-15 DIAGNOSIS — Z639 Problem related to primary support group, unspecified: Secondary | ICD-10-CM | POA: Diagnosis not present

## 2024-04-15 DIAGNOSIS — W19XXXA Unspecified fall, initial encounter: Secondary | ICD-10-CM | POA: Diagnosis not present

## 2024-04-15 DIAGNOSIS — D472 Monoclonal gammopathy: Secondary | ICD-10-CM | POA: Diagnosis not present

## 2024-04-15 DIAGNOSIS — H109 Unspecified conjunctivitis: Secondary | ICD-10-CM | POA: Insufficient documentation

## 2024-04-15 DIAGNOSIS — R29898 Other symptoms and signs involving the musculoskeletal system: Secondary | ICD-10-CM | POA: Diagnosis not present

## 2024-04-15 MED ORDER — POLYVINYL ALCOHOL 1.4 % OP SOLN: 1.0000 [drp] | OPHTHALMIC | Status: DC | PRN

## 2024-04-15 MED ORDER — UMECLIDINIUM-VILANTEROL 62.5-25 MCG/ACT IN AEPB
1.0000 | INHALATION_SPRAY | Freq: Every day | RESPIRATORY_TRACT | Status: DC
Start: 2024-04-15 — End: 2024-04-15

## 2024-04-15 MED ORDER — BUDESON-GLYCOPYRROL-FORMOTEROL 160-9-4.8 MCG/ACT IN AERO
2.0000 | INHALATION_SPRAY | Freq: Two times a day (BID) | RESPIRATORY_TRACT | Status: DC
  Administered 2024-04-15 – 2024-04-18 (×6): 2 via RESPIRATORY_TRACT
  Filled 2024-04-15: qty 5.9

## 2024-04-15 MED ORDER — AZELASTINE HCL 0.1 % NA SOLN
1.0000 | Freq: Two times a day (BID) | NASAL | Status: DC
  Administered 2024-04-15 – 2024-04-18 (×6): 1 via NASAL
  Filled 2024-04-15: qty 30

## 2024-04-15 MED ORDER — IPRATROPIUM-ALBUTEROL 0.5-2.5 (3) MG/3ML IN SOLN: 3.0000 mL | RESPIRATORY_TRACT | Status: DC | PRN

## 2024-04-15 NOTE — Assessment & Plan Note (Signed)
 Chronic neuropathy.  On gabapentin .  Will continue.

## 2024-04-15 NOTE — Assessment & Plan Note (Signed)
 On trazodone  at home nightly. Continue.

## 2024-04-15 NOTE — Assessment & Plan Note (Signed)
-  Differential as above - Continue PT/OT - No injury currently - Also suffered a fall while admitted on day 1.  Head CT negative.

## 2024-04-15 NOTE — Assessment & Plan Note (Signed)
 On Flomax . History of TURP in 2020.

## 2024-04-15 NOTE — Assessment & Plan Note (Signed)
 Noted

## 2024-04-15 NOTE — Assessment & Plan Note (Addendum)
-  Recently seen for rhinorrhea.  Has some cough.  Wheezing on exam - Not hypoxic, no supplemental O2 needed at this time - CXR without evidence of pneumonia - Respiratory viral panel negative.  Flu/COVID/RSV negative - Continue steroids, begin to taper today --Resume home inhalers - Continue DuoNebs  - No evidence of bacterial pneumonia.  Discontinued antibiotics - Reiterated the importance of discontinuing all antihistamines

## 2024-04-15 NOTE — Progress Notes (Signed)
 PROGRESS NOTE    Marc Gray  FMW:[id number] DOB: 10-04-1945 DOA: 04/12/2024 PCP: Valora Lynwood FALCON, MD  Chief Complaint  Patient presents with   [facility] Course:  [name] left lung cancer, prostate cancer, MGUS, hypothyroidism, COPD, eosinophilic asthma, adrenal insufficiency, CKD stage IIIA, CAD, PAD, CVA, iron  deficiency anemia on IV iron , who presented to the hospital with generalized weakness.   He was recently seen in the ED 11/11 for conjunctivitis.  Has been taking OTC antihistamines 4 times daily. Reports last time he felt like normal was 11/12. He endorses intermittent weakness in his left leg and right arm, no tingling or numbness.  He has been walking with a walker.  His weakness has been progressive to the point that he is not having difficulty ambulating. On arrival to the ED he was noted to have left eye ptosis but was unable to report how long it had been that way.  He does take a daily aspirin . In the ED vital signs and labs mostly unremarkable.  CXR without acute process Respiratory viral panel negative.  He was admitted for more workup.  Brain MRI was without acute abnormalities.  Lumbar MRI without new disease.  Patient's dizziness, weakness, and encephalopathy appears to be secondary to chronic conditions complicated by too frequent antihistamine (diphenhydramine ) use.  When antihistamines were stopped he had significant improvement in his mentation and ambulation.  He does remain very weak and PT recommends SNF.  He is now pending placement  Subjective: No acute events overnight. Pt complaining of dyspnea this AM. Wants to resume home inhalers. No wheezing  Objective: Vitals:   04/15/24 0132 04/15/24 0350 04/15/24 0528 04/15/24 0755  BP: (!) 155/92 (!) 145/91 131/75 (!) 147/83  Pulse: 88 72 65 78  Resp:  20  18  Temp: 98.5 F (36.9 C) 97.6 F (36.4 C) (!) 97.3 F (36.3 C) (!) 97.5 F (36.4 C)  TempSrc: Oral  Oral Oral   SpO2: 94% 92% 95% 95%  Weight:      Height:        Intake/Output Summary (Last 24 hours) at 04/15/2024 0804 Last data filed at 04/14/2024 0900 Gross per 24 hour  Intake 240 ml  Output --  Net 240 ml   Filed Weights   04/12/24 0334  Weight: 111.9 kg    Examination: General exam: No acute distress Respiratory system: No work of breathing, symmetric chest wall expansion Cardiovascular system: S1 & S2 heard, RRR.  Gastrointestinal system: Abdomen is obese, soft and nontender Neuro: Alert and oriented x 3  Assessment & Plan:  Active Problems:   Adenocarcinoma of left lung, stage 3 (HCC)   Hypothyroidism due to medication   Adrenal insufficiency   MGUS (monoclonal gammopathy of unknown significance)   History of prostate cancer   Stage 3a chronic kidney disease (HCC)   Ascending aortic aneurysm   PAD (peripheral artery disease)   Chronic pain syndrome   Primary hypertension   Depression   Left leg weakness   Fall at home, initial encounter   COPD with acute exacerbation (HCC)   Obesity (BMI 30-39.9)   Family dynamics problem   Toxic encephalopathy   Weakness generalized    Assessment & Plan Toxic encephalopathy -Now resolved - Appears that patient has been suffering from polypharmacy outpatient, exacerbated by his wife who was likely over providing antihistamine therapy including providing Benadryl  unknown to us  while in the hospital - All home medications have been removed  from the bedside I provided extensive counseling to the patient, his daughter, his wife regarding the importance of discontinuation of all antihistamines.  They endorsed understanding. - Patient is now alert and oriented. Weakness generalized -This is multifactorial given his chronic conditions including COPD, adrenal insufficiency, adenocarcinoma, MGUS, PAD, chronic pain.  Acutely worse than baseline at this time - Working with PT/OT and recommending SNF at [place].  TOC consulted Left leg  weakness -Presents with a left leg weakness intermittently occurring for last 48 hours.  On chart review it appears he has had lumbar radiculopathy for at least a year, has seen neurosurgery in clinic 2024 -Brain MRI without acute intracranial abnormality.  Moderate generalized atrophy with white matter disease which has progressed since prior MRI - Lumbar MRI consistent with degenerative changes and stenosis not drastically worse than 2024 - No acute pain at this time. - PT/OT recommending SNF - [facility] consulted for SNF placement COPD with acute exacerbation (HCC) -Recently seen for rhinorrhea.  Has some cough.  Wheezing on exam - Not hypoxic, no supplemental O2 needed at this time - CXR without evidence of pneumonia - Respiratory viral panel negative.  Flu/COVID/RSV negative - Continue steroids, begin to taper today --Resume home inhalers - Continue DuoNebs  - No evidence of bacterial pneumonia.  Discontinued antibiotics - Reiterated the importance of discontinuing all antihistamines  Adrenal insufficiency -At home takes 10 mg prednisone  in a.m. and 2.5 in PM. - Did not recently increase his steroid while he was feeling weak - Current generalized weakness could be a component of adrenal insufficiency -Currently on p.o. prednisone  for COPD exacerbation, will likely need prolonged taper at [place] Fall at home, initial encounter -Differential as above - Continue PT/OT - No injury currently - Also suffered a fall while admitted on day 1.  Head CT negative. Hypothyroidism due to medication TSH elevated, will increase home dose Synthroid  Adenocarcinoma of left lung, stage 3 (HCC) - Continue outpatient follow-up with oncology. MGUS (monoclonal gammopathy of unknown significance) Continue outpatient follow-up with oncology History of prostate cancer On Flomax . History of TURP in 2020. Stage 3a chronic kidney disease (HCC) Renal function at baseline, creatinine 1.3. Avoid nephrotoxic  medication. Receiving IV hydration as clinically appears to be dehydrated. Ascending aortic aneurysm Noted. PAD (peripheral artery disease) On 81 mg aspirin .  For now we will continue. Chronic pain syndrome Chronic neuropathy.  On gabapentin .  Will continue. Primary hypertension On metoprolol  at home.  Also on Lasix  20 mg daily at home.  Clinically dehydrated. Currently holding.  Resume as needed Depression On trazodone  at home nightly. Continue. Obesity (BMI 30-39.9) Body mass index is 32.53 kg/m.  Placing the patient at high risk for poor outcome. Possibility of OSA cannot be ruled out. Family dynamics problem Multiple family members involved in patient's care, complex social relationships amongst each other. - Currently patient's primary caregiver is his wife who appears to have some degree of dementia and is not an appropriate person to be administering his medications.  I have discussed care directly with his daughter and ex-wife as well (at request of patient) - TOC consulted for social work assistance. Conjunctivitis Not likely bacterial.  Continue eyedrops Obesity Class 1 - Outpatient follow up for lifestyle modification and risk factor management   DVT prophylaxis: Lovenox    Code Status: Full Code Disposition: Medically ready for discharge pending SNF placement Consultants:    Procedures:    Antimicrobials:  Anti-infectives (From admission, onward)    Start     Dose/Rate  Route Frequency Ordered Stop   04/12/24 1000  doxycycline (VIBRA-TABS) tablet 100 mg  Status:  Discontinued        100 mg Oral Every 12 hours 04/12/24 0757 04/13/24 0832       Data Reviewed: I have personally reviewed following labs and imaging studies CBC: Recent Labs  Lab 04/12/24 0527 04/13/24 0745  WBC 6.6 4.5  NEUTROABS 4.5 2.1  HGB 13.3 12.9*  HCT 41.1 38.9*  MCV 95.4 94.0  PLT 142* 119*   Basic Metabolic Panel: Recent Labs  Lab 04/12/24 0527 04/13/24 0440  NA 140 140   K 4.7 4.4  CL 109 108  CO2 24 21*  GLUCOSE 111* 89  BUN 24* 19  CREATININE 1.38* 1.29*  CALCIUM  9.3 8.9  MG  --  2.0   GFR: Estimated Creatinine Clearance: 62.9 mL/min (A) (by C-G formula based on SCr of 1.29 mg/dL (H)). Liver Function Tests: Recent Labs  Lab 04/12/24 0527 04/13/24 0440  AST 22 22  ALT 20 14  ALKPHOS 45 39  BILITOT 0.3 0.4  PROT 6.1* 5.6*  ALBUMIN 3.9 3.5   CBG: Recent Labs  Lab 04/12/24 0534  GLUCAP 109*    Recent Results (from the past 240 hours)  Respiratory (~20 pathogens) panel by PCR     Status: None   Collection Time: 04/12/24 12:00 PM   Specimen: Nasopharyngeal Swab; Respiratory  Result Value Ref Range Status   Adenovirus NOT DETECTED NOT DETECTED Final   Coronavirus 229E NOT DETECTED NOT DETECTED Final    Comment: (NOTE) The Coronavirus on the Respiratory Panel, DOES NOT test for the novel  Coronavirus (2019 nCoV)    Coronavirus HKU1 NOT DETECTED NOT DETECTED Final   Coronavirus NL63 NOT DETECTED NOT DETECTED Final   Coronavirus OC43 NOT DETECTED NOT DETECTED Final   Metapneumovirus NOT DETECTED NOT DETECTED Final   Rhinovirus / Enterovirus NOT DETECTED NOT DETECTED Final   Influenza A NOT DETECTED NOT DETECTED Final   Influenza B NOT DETECTED NOT DETECTED Final   Parainfluenza Virus 1 NOT DETECTED NOT DETECTED Final   Parainfluenza Virus 2 NOT DETECTED NOT DETECTED Final   Parainfluenza Virus 3 NOT DETECTED NOT DETECTED Final   Parainfluenza Virus 4 NOT DETECTED NOT DETECTED Final   Respiratory Syncytial Virus NOT DETECTED NOT DETECTED Final   Bordetella pertussis NOT DETECTED NOT DETECTED Final   Bordetella Parapertussis NOT DETECTED NOT DETECTED Final   Chlamydophila pneumoniae NOT DETECTED NOT DETECTED Final   Mycoplasma pneumoniae NOT DETECTED NOT DETECTED Final    Comment: Performed at [facility], [address], [place], [place] [address]  Resp panel by RT-PCR (RSV, Flu A&B, Covid) Anterior Nasal Swab     Status:  None   Collection Time: 04/13/24 12:15 PM   Specimen: Anterior Nasal Swab  Result Value Ref Range Status   SARS Coronavirus 2 by RT PCR NEGATIVE NEGATIVE Final    Comment: (NOTE) SARS-CoV-2 target nucleic acids are NOT DETECTED.  The SARS-CoV-2 RNA is generally detectable in upper respiratory specimens during the acute phase of infection. The lowest concentration of SARS-CoV-2 viral copies this assay can detect is 138 copies/mL. A negative result does not preclude SARS-Cov-2 infection and should not be used as the sole basis for treatment or other patient management decisions. A negative result may occur with  improper specimen collection/handling, submission of specimen other than nasopharyngeal swab, presence of viral mutation(s) within the areas targeted by this assay, and inadequate number of viral copies(<138  copies/mL). A negative result must be combined with clinical observations, patient history, and epidemiological information. The expected result is Negative.  Fact Sheet for Patients:  bloggercourse.com  Fact Sheet for Healthcare Providers:  seriousbroker.it  This test is no t yet approved or cleared by the [place]  FDA and  has been authorized for detection and/or diagnosis of SARS-CoV-2 by FDA under an Emergency Use Authorization (EUA). This EUA will remain  in effect (meaning this test can be used) for the duration of the COVID-19 declaration under Section 564(b)(1) of the Act, 21 U.S.C.section 360bbb-3(b)(1), unless the authorization is terminated  or revoked sooner.       Influenza A by PCR NEGATIVE NEGATIVE Final   Influenza B by PCR NEGATIVE NEGATIVE Final    Comment: (NOTE) The Xpert Xpress SARS-CoV-2/FLU/RSV plus assay is intended as an aid in the diagnosis of influenza from Nasopharyngeal swab specimens and should not be used as a sole basis for treatment. Nasal washings and aspirates are unacceptable  for Xpert Xpress SARS-CoV-2/FLU/RSV testing.  Fact Sheet for Patients: bloggercourse.com  Fact Sheet for Healthcare Providers: seriousbroker.it  This test is not yet approved or cleared by the [place]  FDA and has been authorized for detection and/or diagnosis of SARS-CoV-2 by FDA under an Emergency Use Authorization (EUA). This EUA will remain in effect (meaning this test can be used) for the duration of the COVID-19 declaration under Section 564(b)(1) of the Act, 21 U.S.C. section 360bbb-3(b)(1), unless the authorization is terminated or revoked.     Resp Syncytial Virus by PCR NEGATIVE NEGATIVE Final    Comment: (NOTE) Fact Sheet for Patients: bloggercourse.com  Fact Sheet for Healthcare Providers: seriousbroker.it  This test is not yet approved or cleared by the [place]  FDA and has been authorized for detection and/or diagnosis of SARS-CoV-2 by FDA under an Emergency Use Authorization (EUA). This EUA will remain in effect (meaning this test can be used) for the duration of the COVID-19 declaration under Section 564(b)(1) of the Act, 21 U.S.C. section 360bbb-3(b)(1), unless the authorization is terminated or revoked.  Performed at [facility], [address], [place], [place] [address]      Radiology Studies: ECHOCARDIOGRAM COMPLETE Result Date: 04/14/2024    ECHOCARDIOGRAM REPORT   Patient Name:   Azaryah Oleksy Gray Date of Exam: 04/14/2024 Medical Rec #:  [id number]                    Height:       73.0 in Accession #:    [id number]                   Weight:       246.6 lb Date of Birth:  [birth year]                     BSA:          2.352 m Patient Age:    78 years                     BP:           104/39 mmHg Patient Gender: M                            HR:           82 bpm. Exam Location:  [facility] Procedure: 2D Echo, Cardiac Doppler, Color  Doppler and Saline Contrast  Bubble            Study (Both Spectral and Color Flow Doppler were utilized during            procedure). Indications:     Syncope R55  History:         Patient has no prior history of Echocardiogram examinations.                  COPD, Signs/Symptoms:Dyspnea; Risk Factors:Diabetes.  Sonographer:     Christopher Furnace Referring Phys:  [id number] [facility] M PATEL Diagnosing Phys: Cara JONETTA Lovelace MD  Sonographer Comments: Image acquisition challenging due to COPD. IMPRESSIONS  1. Left ventricular ejection fraction, by estimation, is 65 to 70%. The left ventricle has normal function. The left ventricle has no regional wall motion abnormalities. There is mild asymmetric left ventricular hypertrophy of the basal and inferior segments. Left ventricular diastolic parameters are consistent with Grade I diastolic dysfunction (impaired relaxation).  2. Right ventricular systolic function is normal. The right ventricular size is normal.  3. The mitral valve is normal in structure. No evidence of mitral valve regurgitation.  4. The aortic valve is normal in structure. Aortic valve regurgitation is not visualized. Aortic valve sclerosis is present, with no evidence of aortic valve stenosis. FINDINGS  Left Ventricle: Left ventricular ejection fraction, by estimation, is 65 to 70%. The left ventricle has normal function. The left ventricle has no regional wall motion abnormalities. Strain was performed and the global longitudinal strain is indeterminate. The left ventricular internal cavity size was normal in size. There is mild asymmetric left ventricular hypertrophy of the basal and inferior segments. Left ventricular diastolic parameters are consistent with Grade I diastolic dysfunction  (impaired relaxation). Right Ventricle: The right ventricular size is normal. No increase in right ventricular wall thickness. Right ventricular systolic function is normal. Left Atrium: Left atrial size was normal in size.  Right Atrium: Right atrial size was normal in size. Pericardium: There is no evidence of pericardial effusion. Mitral Valve: The mitral valve is normal in structure. No evidence of mitral valve regurgitation. MV peak gradient, 4.6 mmHg. The mean mitral valve gradient is 3.0 mmHg. Tricuspid Valve: The tricuspid valve is normal in structure. Tricuspid valve regurgitation is not demonstrated. Aortic Valve: The aortic valve is normal in structure. Aortic valve regurgitation is not visualized. Aortic valve sclerosis is present, with no evidence of aortic valve stenosis. Aortic valve mean gradient measures 3.0 mmHg. Aortic valve peak gradient measures 4.9 mmHg. Aortic valve area, by VTI measures 4.23 cm. Pulmonic Valve: The pulmonic valve was normal in structure. Pulmonic valve regurgitation is not visualized. Aorta: The ascending aorta was not well visualized. IAS/Shunts: No atrial level shunt detected by color flow Doppler. Agitated saline contrast was given intravenously to evaluate for intracardiac shunting. Additional Comments: 3D was performed not requiring image post processing on an independent workstation and was indeterminate.  LEFT VENTRICLE PLAX 2D LVIDd:         4.80 cm   Diastology LVIDs:         3.00 cm   LV e' medial:    4.35 cm/s LV PW:         1.30 cm   LV E/e' medial:  16.6 LV IVS:        1.00 cm   LV e' lateral:   6.64 cm/s LVOT diam:     2.30 cm   LV E/e' lateral: 10.9 LV SV:  95 LV SV Index:   40 LVOT Area:     4.15 cm  RIGHT VENTRICLE RV Basal diam:  4.40 cm RV Mid diam:    3.60 cm LEFT ATRIUM           Index        RIGHT ATRIUM           Index LA diam:      3.00 cm 1.28 cm/m   RA Area:     29.50 cm LA Vol (A4C): 77.5 ml 32.95 ml/m  RA Volume:   117.00 ml 49.75 ml/m  AORTIC VALVE AV Area (Vmax):    4.19 cm AV Area (Vmean):   4.13 cm AV Area (VTI):     4.23 cm AV Vmax:           111.00 cm/s AV Vmean:          72.200 cm/s AV VTI:            0.225 m AV Peak Grad:      4.9 mmHg AV Mean  Grad:      3.0 mmHg LVOT Vmax:         112.00 cm/s LVOT Vmean:        71.800 cm/s LVOT VTI:          0.229 m LVOT/AV VTI ratio: 1.02  AORTA Ao Root diam: 3.90 cm MITRAL VALVE               TRICUSPID VALVE MV Area (PHT): 2.99 cm    TR Peak grad:   7.3 mmHg MV Area VTI:   3.08 cm    TR Vmax:        135.00 cm/s MV Peak grad:  4.6 mmHg MV Mean grad:  3.0 mmHg    SHUNTS MV Vmax:       1.07 m/s    Systemic VTI:  0.23 m MV Vmean:      76.8 cm/s   Systemic Diam: 2.30 cm MV Decel Time: 254 msec MV E velocity: 72.30 cm/s MV A velocity: 81.20 cm/s MV E/A ratio:  0.89 Dwayne D Callwood MD Electronically signed by Cara JONETTA Lovelace MD Signature Date/Time: 04/14/2024/1:54:53 PM    Final    MR LUMBAR SPINE WO CONTRAST Result Date: 04/13/2024 EXAM: MRI LUMBAR SPINE 04/13/2024 08:50:52 PM TECHNIQUE: Multiplanar multisequence MRI of the lumbar spine was performed without the administration of intravenous contrast. COMPARISON: MRI lumbar spine 02/27/2023. CLINICAL HISTORY: Lumbar radiculopathy, no red flags, no prior management; known stenosis per MRI 2024, acute worsening with gait instability now. HISTORY OF prostate Ca, lung Ca and MGUS FINDINGS: BONES AND ALIGNMENT: Similar approximately 2 mm of degenerative anterolisthesis of L3-L4 and 3 mm of degenerative retrolisthesis of L5 on S1. No new malalignment. Normal vertebral body heights. Mildly heterogeneous bone marrow without specific evidence of acute fracture, discitis/osteomyelitis, or suspicious bone lesion. SPINAL CORD: The conus terminates normally. SOFT TISSUES: No paraspinal mass. L1-L2: Mild disc bulge.  No spinal canal stenosis or neural foraminal narrowing. L2-L3: Mild disc bulge, ligamentum flavum thickening, and facet arthropathy. No spinal canal stenosis or neural foraminal narrowing. L3-L4: Broad disc bulging with moderate facet arthropathy and ligamentum flavum thickening. Similar mild subarticular recess stenosis mild canal stenosis. Similar mild right  foraminal stenosis. Patent left foramen. L4-L5: Left eccentric disc bulging and endplate spurring. Bilateral facet arthropathy and ligamentum flavum thickening. Similar left subarticular recess stenosis which could affect the descending left L5 nerve root. Similar mild bilateral foraminal stenosis. Patent  central canal. L5-S1: Endplate spurring and disc bulging. Some mild left foraminal stenosis. IMPRESSION: 1. At L4-5, similar left subarticular recess stenosis and mild bilateral foraminal stenosis. 2. Similar mild foraminal stenosis on the left at L5-S1 and the right at L3-L4. Electronically signed by: Gilmore Molt MD 04/13/2024 09:34 PM EST RP Workstation: [id number]   CT HEAD WO CONTRAST ( ) Result Date: 04/13/2024 EXAM: CT HEAD WITHOUT CONTRAST 04/13/2024 09:00:47 AM TECHNIQUE: CT of the head was performed without the administration of intravenous contrast. Automated exposure control, iterative reconstruction, and/or weight based adjustment of the mA/kV was utilized to reduce the radiation dose to as low as reasonably achievable. COMPARISON: 04/12/2024 CLINICAL HISTORY: Head trauma, coagulopathy (Age 71-64y); fell from hospital bed- hit his head. r/o SDH FINDINGS: BRAIN AND VENTRICLES: No acute hemorrhage. No evidence of acute infarct. No hydrocephalus. No extra-axial collection. No mass effect or midline shift. Age-related atrophy. Mild periventricular white matter disease. Mild calcific atheromatous disease. ORBITS: Status post bilateral lens replacement. SINUSES: Mild mucosal disease within right frontal sinus. SOFT TISSUES AND SKULL: No acute soft tissue abnormality. No skull fracture. IMPRESSION: 1. No acute intracranial abnormality. 2. Age-related atrophy and mild periventricular white matter disease. Electronically signed by: Evalene Coho MD 04/13/2024 09:37 AM EST RP Workstation: [id number]    Scheduled Meds:  aspirin  EC  81 mg Oral Daily   enoxaparin  (LOVENOX ) injection  0.5 mg/kg  Subcutaneous Q24H   gabapentin   100 mg Oral TID   levothyroxine   150 mcg Oral QAC breakfast   pantoprazole   40 mg Oral BID AC   predniSONE   50 mg Oral Q breakfast   rosuvastatin   10 mg Oral Daily   tamsulosin   0.4 mg Oral Daily   traZODone   100 mg Oral QHS   Continuous Infusions:     LOS: 0 days  MDM: Patient is high risk for one or more organ failure.  They necessitate ongoing hospitalization for continued IV therapies and subsequent lab monitoring. Total time spent interpreting labs and vitals, reviewing the medical record, coordinating care amongst consultants and care team members, directly assessing and discussing care with the patient and/or family: 55 min  Deondrea Aguado, DO [facility]  To contact the attending physician between 7A-7P please use Epic Chat. To contact the covering physician during after hours 7P-7A, please review Amion.  04/15/2024, 8:04 AM   *This document has been created with the assistance of dictation software. Please excuse typographical errors. *

## 2024-04-15 NOTE — Assessment & Plan Note (Signed)
-   Continue outpatient follow-up with oncology

## 2024-04-15 NOTE — Assessment & Plan Note (Signed)
 On 81 mg aspirin .  For now we will continue.

## 2024-04-15 NOTE — Assessment & Plan Note (Signed)
 TSH elevated, will increase home dose Synthroid 

## 2024-04-15 NOTE — Assessment & Plan Note (Signed)
-  Presents with a left leg weakness intermittently occurring for last 48 hours.  On chart review it appears he has had lumbar radiculopathy for at least a year, has seen neurosurgery in clinic 2024 -Brain MRI without acute intracranial abnormality.  Moderate generalized atrophy with white matter disease which has progressed since prior MRI - Lumbar MRI consistent with degenerative changes and stenosis not drastically worse than 2024 - No acute pain at this time. - PT/OT recommending SNF - [facility] consulted for SNF placement

## 2024-04-15 NOTE — Assessment & Plan Note (Signed)
 Renal function at baseline, creatinine 1.3. Avoid nephrotoxic medication. Receiving IV hydration as clinically appears to be dehydrated.

## 2024-04-15 NOTE — Assessment & Plan Note (Signed)
 On metoprolol  at home.  Also on Lasix  20 mg daily at home.  Clinically dehydrated. Currently holding.  Resume as needed

## 2024-04-15 NOTE — Plan of Care (Signed)

## 2024-04-15 NOTE — Assessment & Plan Note (Signed)
 Not likely bacterial.  Continue eyedrops

## 2024-04-15 NOTE — Assessment & Plan Note (Signed)
 Multiple family members involved in patient's care, complex social relationships amongst each other. - Currently patient's primary caregiver is his wife who appears to have some degree of dementia and is not an appropriate person to be administering his medications.  I have discussed care directly with his daughter and ex-wife as well (at request of patient) - TOC consulted for social work assistance.

## 2024-04-15 NOTE — Assessment & Plan Note (Signed)
-  Now resolved - Appears that patient has been suffering from polypharmacy outpatient, exacerbated by his wife who was likely over providing antihistamine therapy including providing Benadryl  unknown to us  while in the hospital - All home medications have been removed from the bedside I provided extensive counseling to the patient, his daughter, his wife regarding the importance of discontinuation of all antihistamines.  They endorsed understanding. - Patient is now alert and oriented.

## 2024-04-15 NOTE — Plan of Care (Signed)
  Problem: Health Behavior/Discharge Planning: Goal: Ability to manage health-related needs will improve Outcome: Progressing   Problem: Clinical Measurements: Goal: Will remain free from infection Outcome: Progressing   Problem: Coping: Goal: Level of anxiety will decrease Outcome: Progressing   Problem: Pain Managment: Goal: General experience of comfort will improve and/or be controlled Outcome: Progressing

## 2024-04-15 NOTE — Assessment & Plan Note (Signed)
-  This is multifactorial given his chronic conditions including COPD, adrenal insufficiency, adenocarcinoma, MGUS, PAD, chronic pain.  Acutely worse than baseline at this time - Working with PT/OT and recommending SNF at DC.  TOC consulted

## 2024-04-15 NOTE — Assessment & Plan Note (Signed)
 Body mass index is 32.53 kg/m.  Placing the patient at high risk for poor outcome. Possibility of OSA cannot be ruled out.

## 2024-04-15 NOTE — Assessment & Plan Note (Signed)
-  At home takes 10 mg prednisone  in a.m. and 2.5 in PM. - Did not recently increase his steroid while he was feeling weak - Current generalized weakness could be a component of adrenal insufficiency -Currently on p.o. prednisone  for COPD exacerbation, will likely need prolonged taper at DC

## 2024-04-16 DIAGNOSIS — R531 Weakness: Secondary | ICD-10-CM | POA: Diagnosis not present

## 2024-04-16 NOTE — Plan of Care (Signed)

## 2024-04-16 NOTE — Progress Notes (Signed)
 PROGRESS NOTE    Marc Gray  FMW:[id number] DOB: 1945-11-01 DOA: 04/12/2024 PCP: Valora Lynwood FALCON, MD  103A/103A-AA  LOS: 0 days   Brief hospital course:   Assessment & Plan: [name] left lung cancer, prostate cancer, MGUS, hypothyroidism, COPD, eosinophilic asthma, adrenal insufficiency, CKD stage IIIA, CAD, PAD, CVA, iron  deficiency anemia on IV iron , who presented to the hospital with generalized weakness.   He was recently seen in the ED 11/11 for conjunctivitis.  Has been taking OTC antihistamines 4 times daily. Reports last time he felt like normal was 11/12. He endorses intermittent weakness in his left leg and right arm, no tingling or numbness.  He has been walking with a walker.  His weakness has been progressive to the point that he is not having difficulty ambulating.   Toxic encephalopathy - Appears that patient has been suffering from polypharmacy outpatient, exacerbated by his wife who was likely over providing antihistamine therapy including providing Benadryl  unknown to us  while in the hospital - All home medications have been removed from the bedside I provided extensive counseling to the patient, his daughter, his wife regarding the importance of discontinuation of all antihistamines.  They endorsed understanding. - Patient is now alert and oriented.  Weakness generalized -This is multifactorial given his chronic conditions including COPD, adrenal insufficiency, adenocarcinoma, MGUS, PAD, chronic pain.  Acutely worse than baseline at this time. --MRI neg for stroke - PT/OT, SNF rehab  Left leg weakness -Presents with a left leg weakness intermittently occurring for last 48 hours.  On chart review it appears he has had lumbar radiculopathy for at least a year, has seen neurosurgery in clinic 2024 -Brain MRI without acute intracranial abnormality.  Moderate generalized atrophy with white matter disease which has progressed since  prior MRI - Lumbar MRI consistent with degenerative changes and stenosis not drastically worse than 2024 - No acute pain at this time. - PT/OT, SNF rehab  COPD with acute exacerbation (HCC) -Recently seen for rhinorrhea.  Has some cough.  Wheezing on exam - Not hypoxic, no supplemental O2 needed at this time - CXR without evidence of pneumonia.  Discontinued antibiotics - Respiratory viral panel negative.  Flu/COVID/RSV negative --cont prednisone  50 mg daily - Continue DuoNebs  --cont bronchodilators  Adrenal insufficiency -At home takes 10 mg prednisone  in a.m. and 2.5 in PM. - Did not recently increase his steroid while he was feeling weak - Current generalized weakness could be a component of adrenal insufficiency -Currently on p.o. prednisone  for COPD exacerbation, will likely need prolonged taper at DC  Fall at home, initial encounter - Also suffered a fall while admitted on day 1.  Head CT negative. --PT/OT  Hypothyroidism due to medication TSH elevated --check free T4  Adenocarcinoma of left lung, stage 3 (HCC) - Continue outpatient follow-up with oncology.  MGUS (monoclonal gammopathy of unknown significance) Continue outpatient follow-up with oncology  History of prostate cancer History of TURP in 2020. --cont Flomax   Stage 3a chronic kidney disease (HCC) Renal function at baseline, creatinine 1.3.  Ascending aortic aneurysm Noted.  PAD (peripheral artery disease) --cont ASA and statin  Chronic neuropathy.   --cont gabapentin   Primary hypertension On metoprolol  at home.  Also on Lasix  20 mg daily at home.  Clinically dehydrated. Currently holding.  Resume as needed  Depression --cont trazodone   Obesity I (BMI 30-39.9) Body mass index is 32.53 kg/m.  Placing the patient at high risk for poor outcome. Possibility of OSA cannot  be ruled out.  Family dynamics problem Multiple family members involved in patient's care, complex social relationships  amongst each other. - Currently patient's primary caregiver is his wife who appears to have some degree of dementia and is not an appropriate person to be administering his medications.  I have discussed care directly with his daughter and ex-wife as well (at request of patient) - TOC consulted for social work assistance.  Conjunctivitis Not likely bacterial.  Continue eyedrops   DVT prophylaxis: Lovenox  SQ Code Status: Full code  Family Communication: wife updated at bedside today Level of care: Telemetry Dispo:   The patient is from: home Anticipated d/c is to: SNF rehab Anticipated d/c date is: whenever SNF accepts   Subjective and Interval History:  Pt still had more weakness in the LLE, but overall improved.   Objective: Vitals:   04/16/24 0342 04/16/24 0742 04/16/24 1551 04/16/24 1920  BP: (!) 141/79 (!) 149/88 (!) 159/87 (!) 149/90  Pulse: (!) 57 66 68 72  Resp: 18 18 18 16   Temp: 98.2 F (36.8 C) (!) 97.5 F (36.4 C) 98 F (36.7 C) 97.8 F (36.6 C)  TempSrc:  Oral Oral Oral  SpO2: 96% 94% 96% 97%  Weight: 105.1 kg     Height:        Intake/Output Summary (Last 24 hours) at 04/16/2024 2124 Last data filed at 04/16/2024 1700 Gross per 24 hour  Intake 240 ml  Output 200 ml  Net 40 ml   Filed Weights   04/12/24 0334 04/16/24 0342  Weight: 111.9 kg 105.1 kg    Examination:   Constitutional: NAD, AAOx3 HEENT: conjunctivae and lids normal, EOMI CV: No cyanosis.   RESP: normal respiratory effort, on RA Neuro: II - XII grossly intact.   Psych: Normal mood and affect.  Appropriate judgement and reason   Data Reviewed: I have personally reviewed labs and imaging studies  Time spent: 50 minutes  Ellouise Haber, MD [facility] If 7PM-7AM, please contact night-coverage 04/16/2024, 9:24 PM

## 2024-04-16 NOTE — Progress Notes (Signed)
 Physical Therapy Treatment Patient Details Name: Marc Gray MRN: [id number] DOB: 21-Dec-1945 Today's Date: 04/16/2024   History of Present Illness Pt is a 78 y/o M admitted on 04/12/24 after presenting with c/o L sided weakness. Head CT negative for acute issues, MRI pending. PMH: CAD, prior MI, PVD, CKD, lung CA in remission    PT Comments  Pt received in bed, requires HOB elevated and heavy use of side rail to maneuver to edge of bed. Several attempts to stand from elevated bed to RW with posterior bias. Pt ambulated in hall with close CGA due to Hx of buckling with short shuffling like steps and knees placed in hyperextension to prevent buckling. Flexed forward posture however no LOB. Pt returned to room with all needs met. Currently very deconditioned with B LE weakness and balance deficits. Anticipate good progress at STR prior to returning home.    If plan is discharge home, recommend the following: A little help with bathing/dressing/bathroom;A little help with walking and/or transfers;Assistance with cooking/housework;Assist for transportation;Help with stairs or ramp for entrance   Can travel by private vehicle     Yes  Equipment Recommendations  None recommended by PT    Recommendations for Other Services       Precautions / Restrictions Precautions Precautions: Fall Recall of Precautions/Restrictions: Intact Restrictions Weight Bearing Restrictions Per Provider Order: No     Mobility  Bed Mobility Overal bed mobility: Needs Assistance Bed Mobility: Supine to Sit     Supine to sit: Modified independent (Device/Increase time), HOB elevated, Used rails          Transfers Overall transfer level: Needs assistance Equipment used: Rolling walker (2 wheels) Transfers: Sit to/from Stand Sit to Stand: Contact guard assist, From elevated surface           General transfer comment: use of momentum to come into standing with initial posterior bias  requiring 3 attempts to attain    Ambulation/Gait Ambulation/Gait assistance: Contact guard assist, Min assist Gait Distance (Feet): 100 Feet Assistive device: Rolling walker (2 wheels) Gait Pattern/deviations: Step-through pattern, Knee hyperextension - right, Knee hyperextension - left, Decreased stride length, Trunk flexed Gait velocity: decreased     General Gait Details:  (No knee buckling, pt loks knees into extension to prevent)   Stairs             Wheelchair Mobility     Tilt Bed    Modified Rankin (Stroke Patients Only)       Balance Overall balance assessment: Needs assistance Sitting-balance support: Feet supported Sitting balance-Leahy Scale: Good     Standing balance support: During functional activity, Reliant on assistive device for balance, Bilateral upper extremity supported Standing balance-Leahy Scale: Poor Standing balance comment:  (High fall risk)                            Communication Communication Communication: No apparent difficulties  Cognition Arousal: Alert Behavior During Therapy: WFL for tasks assessed/performed   PT - Cognitive impairments: No apparent impairments                         Following commands: Intact      Cueing Cueing Techniques: Verbal cues  Exercises      General Comments        Pertinent Vitals/Pain Pain Assessment Pain Assessment: No/denies pain    Home Living  Prior Function            PT Goals (current goals can now be found in the care plan section) Acute Rehab PT Goals Patient Stated Goal: none stated Progress towards PT goals: Progressing toward goals    Frequency    Min 2X/week      PT Plan      Co-evaluation              AM-PAC PT 6 Clicks Mobility   Outcome Measure  Help needed turning from your back to your side while in a flat bed without using bedrails?: A Little Help needed moving from lying on  your back to sitting on the side of a flat bed without using bedrails?: A Little Help needed moving to and from a bed to a chair (including a wheelchair)?: A Little Help needed standing up from a chair using your arms (e.g., wheelchair or bedside chair)?: A Little Help needed to walk in hospital room?: A Little Help needed climbing 3-5 steps with a railing? : A Lot 6 Click Score: 17    End of Session Equipment Utilized During Treatment: Gait belt Activity Tolerance: Patient tolerated treatment well Patient left: in bed;with call bell/phone within reach;with bed alarm set Nurse Communication: Mobility status PT Visit Diagnosis: Unsteadiness on feet (R26.81);Muscle weakness (generalized) (M62.81);Difficulty in walking, not elsewhere classified (R26.2);Other abnormalities of gait and mobility (R26.89)     Time: 8364-8345 PT Time Calculation (min) (ACUTE ONLY): 19 min  Charges:    $Therapeutic Activity: 8-22 mins PT General Charges $$ ACUTE PT VISIT: 1 Visit                    Darice Bohr, PTA  Darice JAYSON Bohr 04/16/2024, 5:58 PM

## 2024-04-16 NOTE — Plan of Care (Signed)
  Problem: Education: Goal: Knowledge of General Education information will improve Description: Including pain rating scale, medication(s)/side effects and non-pharmacologic comfort measures Outcome: Progressing   Problem: Health Behavior/Discharge Planning: Goal: Ability to manage health-related needs will improve Outcome: Progressing   Problem: Clinical Measurements: Goal: Ability to maintain clinical measurements within normal limits will improve Outcome: Progressing Goal: Will remain free from infection Outcome: Progressing Goal: Diagnostic test results will improve Outcome: Progressing Goal: Respiratory complications will improve Outcome: Progressing Goal: Cardiovascular complication will be avoided Outcome: Progressing   Problem: Nutrition: Goal: Adequate nutrition will be maintained Outcome: Progressing   Problem: Coping: Goal: Level of anxiety will decrease Outcome: Progressing   Problem: Elimination: Goal: Will not experience complications related to bowel motility Outcome: Progressing Goal: Will not experience complications related to urinary retention Outcome: Progressing   Problem: Pain Managment: Goal: General experience of comfort will improve and/or be controlled Outcome: Progressing   Problem: Safety: Goal: Ability to remain free from injury will improve Outcome: Progressing   Problem: Skin Integrity: Goal: Risk for impaired skin integrity will decrease Outcome: Progressing   Problem: Education: Goal: Knowledge of disease or condition will improve Outcome: Progressing Goal: Knowledge of secondary prevention will improve (MUST DOCUMENT ALL) Outcome: Progressing Goal: Knowledge of patient specific risk factors will improve (DELETE if not current risk factor) Outcome: Progressing   Problem: Ischemic Stroke/TIA Tissue Perfusion: Goal: Complications of ischemic stroke/TIA will be minimized Outcome: Progressing   Problem: Coping: Goal: Will  verbalize positive feelings about self Outcome: Progressing Goal: Will identify appropriate support needs Outcome: Progressing   Problem: Health Behavior/Discharge Planning: Goal: Ability to manage health-related needs will improve Outcome: Progressing Goal: Goals will be collaboratively established with patient/family Outcome: Progressing   Problem: Self-Care: Goal: Ability to participate in self-care as condition permits will improve Outcome: Progressing Goal: Verbalization of feelings and concerns over difficulty with self-care will improve Outcome: Progressing Goal: Ability to communicate needs accurately will improve Outcome: Progressing   Problem: Nutrition: Goal: Risk of aspiration will decrease Outcome: Progressing Goal: Dietary intake will improve Outcome: Progressing

## 2024-04-16 NOTE — TOC Progression Note (Addendum)
 Transition of Care [facility]) - Progression Note    Patient Details  Name: Marc Gray MRN: [id number] Date of Birth: 03-03-1946  Transition of Care [facility]) CM/SW Contact  Victory Jackquline RAMAN, RN Phone Number: 04/16/2024, 10:09 AM  Clinical Narrative:    10:03 am: [facility] spoke with Therisa at [phone] with [facility] and she accepted the patient. Secure chat sent to the CMA to start insurance Auth for [employer]. MD made aware  10:07 am: RNCM spoke to [place], patient's spouse at [phone] who was at the patients bedside. RNCM informed her that the patient had been accepted to [employer] and the we were working on [employer] and would let her know once the Auth was approved. She verbalized understanding and informed the patient while I was on the phone with her. RNCM will continue to follow for discharge planning needs.  10:50 am: RNCM received a message via secure chat from CMA informing me that the patient has [employer] and I would have to do the Auth.  4:00 pm: RNCM spoke with Tammy at [facility] at [phone]) Patient accepted to [employer]. Auth Started.    Barriers to Discharge: Continued Medical Work up               Expected Discharge Plan and Services                                               Social Drivers of Health (SDOH) Interventions SDOH Screenings   Food Insecurity: No Food Insecurity (04/12/2024)  Housing: Unknown (04/12/2024)  Transportation Needs: No Transportation Needs (04/12/2024)  Utilities: Not At Risk (04/12/2024)  Depression (PHQ2-9): Low Risk  (12/25/2023)  Recent Concern: Depression (PHQ2-9) - Medium Risk (12/04/2023)  Financial Resource Strain: Low Risk  (11/07/2023)   Received from [facility]  Social Connections: Socially Integrated (04/12/2024)  Tobacco Use: Medium Risk (04/12/2024)    Readmission Risk Interventions     No data to display

## 2024-04-17 DIAGNOSIS — R531 Weakness: Secondary | ICD-10-CM | POA: Diagnosis not present

## 2024-04-17 LAB — T4, FREE: Free T4: 1.07 ng/dL (ref 0.61–1.12)

## 2024-04-17 MED ORDER — LOSARTAN POTASSIUM 50 MG PO TABS
50.0000 mg | ORAL_TABLET | Freq: Every day | ORAL | Status: DC
  Administered 2024-04-17 – 2024-04-18 (×2): 50 mg via ORAL
  Filled 2024-04-17 (×2): qty 1

## 2024-04-17 NOTE — Progress Notes (Signed)
 PROGRESS NOTE    Marc Gray  FMW:[id number] DOB: 1945/06/12 DOA: 04/12/2024 PCP: Valora Lynwood FALCON, MD  103A/103A-AA  LOS: 0 days   Brief hospital course:   Assessment & Plan: Marc Gray left lung cancer, prostate cancer, MGUS, hypothyroidism, COPD, eosinophilic asthma, adrenal insufficiency, CKD stage IIIA, CAD, PAD, CVA, iron  deficiency anemia on IV iron , who presented to the hospital with generalized weakness.   He was recently seen in the ED 11/11 for conjunctivitis.  Has been taking OTC antihistamines 4 times daily. Reports last time he felt like normal was 11/12. He endorses intermittent weakness in his left leg and right arm, no tingling or numbness.  He has been walking with a walker.  His weakness has been progressive to the point that he is not having difficulty ambulating.   Toxic encephalopathy - Appears that patient has been suffering from polypharmacy outpatient, exacerbated by his wife who was likely over providing antihistamine therapy including providing Benadryl  unknown to us  while in the hospital - All home medications have been removed from the bedside I provided extensive counseling to the patient, his daughter, his wife regarding the importance of discontinuation of all antihistamines.  They endorsed understanding. - Patient is now alert and oriented.  Weakness generalized -This is multifactorial given his chronic conditions including COPD, adrenal insufficiency, adenocarcinoma, MGUS, PAD, chronic pain.  Acutely worse than baseline at this time. --MRI neg for stroke --PT/OT  Left leg weakness -Presents with a left leg weakness intermittently occurring for last 48 hours.  On chart review it appears he has had lumbar radiculopathy for at least a year, has seen neurosurgery in clinic 2024 -Brain MRI without acute intracranial abnormality.  Moderate generalized atrophy with white matter disease which has progressed since prior MRI -  Lumbar MRI consistent with degenerative changes and stenosis not drastically worse than 2024 - No acute pain at this time. --PT/OT  COPD with acute exacerbation (HCC) -Recently seen for rhinorrhea.  Has some cough.  Wheezing on exam - Not hypoxic, no supplemental O2 needed at this time - CXR without evidence of pneumonia.  Discontinued antibiotics - Respiratory viral panel negative.  Flu/COVID/RSV negative --cont prednisone  50 mg daily - Continue DuoNebs  --cont bronchodilators  Adrenal insufficiency -At home takes 10 mg prednisone  in a.m. and 2.5 in PM. - Did not recently increase his steroid while he was feeling weak - Current generalized weakness could be a component of adrenal insufficiency -Currently on p.o. prednisone  for COPD exacerbation, will likely need prolonged taper at DC  Fall at home, initial encounter - Also suffered a fall while admitted on day 1.  Head CT negative. --PT/OT  Hypothyroidism due to medication TSH elevated, but free T4 wnl --cont Synthroid  at home dose  Adenocarcinoma of left lung, stage 3 (HCC) - Continue outpatient follow-up with oncology.  MGUS (monoclonal gammopathy of unknown significance) Continue outpatient follow-up with oncology  History of prostate cancer History of TURP in 2020. --cont Flomax   Stage 3a chronic kidney disease (HCC) Renal function at baseline, creatinine 1.3.  Ascending aortic aneurysm Noted.  PAD (peripheral artery disease) --cont ASA and statin  Chronic neuropathy.   --cont gabapentin   Primary hypertension On metoprolol  at home.  Also on Lasix  20 mg daily at home.  Clinically dehydrated. Currently holding.  Resume as needed  Depression --cont trazodone   Obesity I (BMI 30-39.9) Body mass index is 32.53 kg/m.  Placing the patient at high risk for poor outcome. Possibility of OSA cannot  be ruled out.  Family dynamics problem Multiple family members involved in patient's care, complex social  relationships amongst each other. - Currently patient's primary caregiver is his wife who appears to have some degree of dementia and is not an appropriate person to be administering his medications.  I have discussed care directly with his daughter and ex-wife as well (at request of patient) - TOC consulted for social work assistance.  Conjunctivitis Not likely bacterial.  Continue eyedrops   DVT prophylaxis: Lovenox  SQ Code Status: Full code  Family Communication: wife updated at bedside today Level of care: Telemetry Dispo:   The patient is from: home Anticipated d/c is to: SNF rehab Anticipated d/c date is: whenever SNF accepts   Subjective and Interval History:  Pt reported good oral intake.     Objective: Vitals:   04/17/24 1512 04/17/24 1928 04/18/24 0334 04/18/24 0752  BP: 124/72 (!) 139/93 (!) 146/94 (!) 149/86  Pulse: 66 66 (!) 58 61  Resp: 18 16 15 19   Temp: (!) 97.5 F (36.4 C) 98.1 F (36.7 C) 98.6 F (37 C) (!) 97.5 F (36.4 C)  TempSrc: Oral Oral Oral Oral  SpO2: 96% 95% 94% 96%  Weight:      Height:        Intake/Output Summary (Last 24 hours) at 04/18/2024 0945 Last data filed at 04/18/2024 0200 Gross per 24 hour  Intake 960 ml  Output 300 ml  Net 660 ml   Filed Weights   04/12/24 0334 04/16/24 0342  Weight: 111.9 kg 105.1 kg    Examination:   Constitutional: NAD, AAOx3 HEENT: conjunctivae and lids normal, EOMI CV: No cyanosis.   RESP: normal respiratory effort, on RA Neuro: II - XII grossly intact.   Psych: Normal mood and affect.  Appropriate judgement and reason   Data Reviewed: I have personally reviewed labs and imaging studies  Time spent: 35 minutes  Ellouise Haber, MD [facility] If 7PM-7AM, please contact night-coverage 04/18/2024, 9:45 AM

## 2024-04-17 NOTE — Progress Notes (Signed)
 Physical Therapy Treatment Patient Details Name: Marc Gray MRN: [id number] DOB: Apr 16, 1946 Today's Date: 04/17/2024   History of Present Illness Pt is a 78 y/o M admitted on 04/12/24 after presenting with c/o L sided weakness. Head CT negative for acute issues, MRI pending. PMH: CAD, prior MI, PVD, CKD, lung CA in remission    PT Comments  Pt received in bed, anxious to get up and moving and hopefully transition to STR today. Improved ability to stand from elevated bed on first attempt with cues for wt shifting forward. No LOB or knee buckling during gait training with RW in hallway, however did require MinA for balance while maneuvering into bathroom. Pt tends to lose focus on safety awareness if feel rushed or distracted. Pt continues to progress well, will greatly benefit from STR prior to returning home with supportive wife.   If plan is discharge home, recommend the following: A little help with bathing/dressing/bathroom;A little help with walking and/or transfers;Assistance with cooking/housework;Assist for transportation;Help with stairs or ramp for entrance   Can travel by private vehicle     Yes  Equipment Recommendations  None recommended by PT    Recommendations for Other Services       Precautions / Restrictions Precautions Precautions: Fall Recall of Precautions/Restrictions: Intact Restrictions Weight Bearing Restrictions Per Provider Order: No     Mobility  Bed Mobility Overal bed mobility: Needs Assistance Bed Mobility: Supine to Sit     Supine to sit: Modified independent (Device/Increase time), HOB elevated, Used rails     General bed mobility comments:  (Heavy asist of side rail to transition to EOB)    Transfers Overall transfer level: Needs assistance Equipment used: Rolling walker (2 wheels) Transfers: Sit to/from Stand Sit to Stand: Contact guard assist, From elevated surface           General transfer comment:  (Improved  ability to stand from raised surface with cues to lean forward initially and prevent posterior LOB.)    Ambulation/Gait Ambulation/Gait assistance: Contact guard assist, Min assist Gait Distance (Feet):  (120) Assistive device: Rolling walker (2 wheels) Gait Pattern/deviations: Step-through pattern, Knee hyperextension - right, Knee hyperextension - left, Decreased stride length, Trunk flexed Gait velocity: decreased     General Gait Details:  (No buckling, cues throughout gait training for upright posture, increased heel strike and maintaining focus on task)   Stairs             Wheelchair Mobility     Tilt Bed    Modified Rankin (Stroke Patients Only)       Balance Overall balance assessment: Needs assistance Sitting-balance support: Feet supported Sitting balance-Leahy Scale: Good     Standing balance support: During functional activity, Reliant on assistive device for balance, Bilateral upper extremity supported Standing balance-Leahy Scale: Poor Standing balance comment:  (Heavy reliance on RW for balance)                            Communication Communication Communication: No apparent difficulties  Cognition Arousal: Alert Behavior During Therapy: WFL for tasks assessed/performed   PT - Cognitive impairments: No apparent impairments                         Following commands: Intact      Cueing Cueing Techniques: Verbal cues  Exercises      General Comments General comments (skin integrity, edema, etc.):  (Pt educated  on importane of posture, safety awareness and fall prevention)      Pertinent Vitals/Pain Pain Assessment Pain Assessment: No/denies pain    Home Living                          Prior Function            PT Goals (current goals can now be found in the care plan section) Acute Rehab PT Goals Patient Stated Goal: none stated Progress towards PT goals: Progressing toward goals     Frequency    Min 2X/week      PT Plan      Co-evaluation              AM-PAC PT 6 Clicks Mobility   Outcome Measure  Help needed turning from your back to your side while in a flat bed without using bedrails?: A Little Help needed moving from lying on your back to sitting on the side of a flat bed without using bedrails?: A Little Help needed moving to and from a bed to a chair (including a wheelchair)?: A Little Help needed standing up from a chair using your arms (e.g., wheelchair or bedside chair)?: A Little Help needed to walk in hospital room?: A Little Help needed climbing 3-5 steps with a railing? : A Lot 6 Click Score: 17    End of Session Equipment Utilized During Treatment: Gait belt Activity Tolerance: Patient tolerated treatment well Patient left: in chair;with call bell/phone within reach;with chair alarm set;with family/visitor present Nurse Communication: Mobility status PT Visit Diagnosis: Unsteadiness on feet (R26.81);Muscle weakness (generalized) (M62.81);Difficulty in walking, not elsewhere classified (R26.2);Other abnormalities of gait and mobility (R26.89)     Time: 0951-1010 PT Time Calculation (min) (ACUTE ONLY): 19 min  Charges:    $Therapeutic Activity: 8-22 mins PT General Charges $$ ACUTE PT VISIT: 1 Visit                    Darice Bohr, PTA  Darice JAYSON Bohr 04/17/2024, 11:45 AM

## 2024-04-17 NOTE — Plan of Care (Signed)

## 2024-04-17 NOTE — Plan of Care (Signed)
  Problem: Education: Goal: Knowledge of General Education information will improve Description: Including pain rating scale, medication(s)/side effects and non-pharmacologic comfort measures Outcome: Progressing   Problem: Health Behavior/Discharge Planning: Goal: Ability to manage health-related needs will improve Outcome: Progressing   Problem: Clinical Measurements: Goal: Ability to maintain clinical measurements within normal limits will improve Outcome: Progressing Goal: Will remain free from infection Outcome: Progressing Goal: Diagnostic test results will improve Outcome: Progressing Goal: Respiratory complications will improve Outcome: Progressing Goal: Cardiovascular complication will be avoided Outcome: Progressing   Problem: Activity: Goal: Risk for activity intolerance will decrease Outcome: Progressing   Problem: Nutrition: Goal: Adequate nutrition will be maintained Outcome: Progressing   Problem: Coping: Goal: Level of anxiety will decrease Outcome: Progressing   Problem: Elimination: Goal: Will not experience complications related to bowel motility Outcome: Progressing Goal: Will not experience complications related to urinary retention Outcome: Progressing   Problem: Pain Managment: Goal: General experience of comfort will improve and/or be controlled Outcome: Progressing   Problem: Safety: Goal: Ability to remain free from injury will improve Outcome: Progressing   Problem: Skin Integrity: Goal: Risk for impaired skin integrity will decrease Outcome: Progressing   Problem: Education: Goal: Knowledge of disease or condition will improve Outcome: Progressing Goal: Knowledge of secondary prevention will improve (MUST DOCUMENT ALL) Outcome: Progressing Goal: Knowledge of patient specific risk factors will improve (DELETE if not current risk factor) Outcome: Progressing   Problem: Ischemic Stroke/TIA Tissue Perfusion: Goal: Complications of  ischemic stroke/TIA will be minimized Outcome: Progressing   Problem: Coping: Goal: Will verbalize positive feelings about self Outcome: Progressing Goal: Will identify appropriate support needs Outcome: Progressing

## 2024-04-17 NOTE — Progress Notes (Signed)
 Occupational Therapy Treatment Patient Details Name: Calyx Hawker MRN: [id number] DOB: 1945-09-15 Today's Date: 04/17/2024   History of present illness Pt is a 78 y/o M admitted on 04/12/24 after presenting with c/o L sided weakness. Head CT negative for acute issues, MRI pending. PMH: CAD, prior MI, PVD, CKD, lung CA in remission   OT comments  Patient seen for OT treatment on this date. Upon arrival to room patient resting in bed with family/friend present, agreeable to treatment. Patient transitioned to EOB with bed flat; educ on bed rails for at home, patient receptive. OT provided there ex sitting EOB for core stability to reduce L lateral lean, patient continues to lose balance to L and requires cues to self correct, see below for therex. Patient stood at sink for 3 minutes with poor safety awareness during grooming tasks (near LOB corrected by OT).  Patient ended treatment in bed with bed/chair alarm on and all needs within reach. Patient making good progress toward goals, will continue to follow POC. Discharge recommendation remains appropriate.        If plan is discharge home, recommend the following:  A little help with walking and/or transfers;A little help with bathing/dressing/bathroom;Assistance with cooking/housework;Assist for transportation;Help with stairs or ramp for entrance   Equipment Recommendations  Other (comment) (defer to next venue of care)    Recommendations for Other Services      Precautions / Restrictions Precautions Precautions: Fall Recall of Precautions/Restrictions: Intact Restrictions Weight Bearing Restrictions Per Provider Order: No       Mobility Bed Mobility Overal bed mobility: Needs Assistance Bed Mobility: Supine to Sit, Sit to Supine     Supine to sit: Modified independent (Device/Increase time), HOB elevated, Used rails Sit to supine: Modified independent (Device/Increase time)   General bed mobility comments: no A  needed    Transfers Overall transfer level: Needs assistance Equipment used: Rolling walker (2 wheels) Transfers: Sit to/from Stand Sit to Stand: Contact guard assist, From elevated surface           General transfer comment: cues for hand placement, steadying A once in stance     Balance Overall balance assessment: Needs assistance Sitting-balance support: Feet supported Sitting balance-Leahy Scale: Fair Sitting balance - Comments: L lateral lean Postural control: Left lateral lean Standing balance support: During functional activity, Reliant on assistive device for balance, Bilateral upper extremity supported Standing balance-Leahy Scale: Poor                             ADL either performed or assessed with clinical judgement   ADL Overall ADL's : Needs assistance/impaired     Grooming: Contact guard assist;Minimal assistance;Standing Grooming Details (indicate cue type and reason): stood at sink with walker to brush teeth and wash face                                    Extremity/Trunk Assessment              Vision       Perception     Praxis     Communication Communication Communication: No apparent difficulties   Cognition Arousal: Alert Behavior During Therapy: [facility] for tasks assessed/performed  Following commands: Intact        Cueing   Cueing Techniques: Verbal cues  Exercises Exercises: Other exercises Other Exercises Other Exercises: postural stability/core stability: neck flexion/extension, AAROM shoulder flexion, forward trunk flexion with scapular retraction, trunk rotation    Shoulder Instructions       General Comments  (Pt educated on importane of posture, safety awareness and fall prevention)    Pertinent Vitals/ Pain       Pain Assessment Pain Assessment: No/denies pain  Home Living                                          Prior  Functioning/Environment              Frequency  Min 2X/week        Progress Toward Goals  OT Goals(current goals can now be found in the care plan section)  Progress towards OT goals: Progressing toward goals  Acute Rehab OT Goals Patient Stated Goal: get stronger OT Goal Formulation: With patient Time For Goal Achievement: 04/26/24 Potential to Achieve Goals: Good ADL Goals Pt Will Perform Upper Body Dressing: Independently Pt Will Perform Lower Body Dressing: with modified independence Pt Will Transfer to Toilet: with modified independence  Plan      Co-evaluation                 AM-PAC OT 6 Clicks Daily Activity     Outcome Measure   Help from another person eating meals?: None Help from another person taking care of personal grooming?: None Help from another person toileting, which includes using toliet, bedpan, or urinal?: A Little Help from another person bathing (including washing, rinsing, drying)?: A Little Help from another person to put on and taking off regular upper body clothing?: A Little Help from another person to put on and taking off regular lower body clothing?: A Little 6 Click Score: 20    End of Session Equipment Utilized During Treatment: Gait belt  OT Visit Diagnosis: Muscle weakness (generalized) (M62.81)   Activity Tolerance Patient tolerated treatment well   Patient Left in bed;with call bell/phone within reach;with bed alarm set;with family/visitor present   Nurse Communication          Time: 8662-8593 OT Time Calculation (min): 29 min  Charges: OT General Charges $OT Visit: 1 Visit OT Treatments $Self Care/Home Management : 8-22 mins $Therapeutic Exercise: 8-22 mins  Rogers Clause, OT/L MSOT, 04/17/2024

## 2024-04-18 DIAGNOSIS — R531 Weakness: Secondary | ICD-10-CM | POA: Diagnosis not present

## 2024-04-18 MED ORDER — BENZONATATE 200 MG PO CAPS: 200.0000 mg | ORAL_CAPSULE | Freq: Three times a day (TID) | ORAL | 0 refills | Status: AC | PRN

## 2024-04-18 MED ORDER — PANTOPRAZOLE SODIUM 40 MG PO TBEC: 40.0000 mg | DELAYED_RELEASE_TABLET | Freq: Two times a day (BID) | ORAL | Status: AC

## 2024-04-18 MED ORDER — PREDNISONE 20 MG PO TABS
20.0000 mg | ORAL_TABLET | Freq: Every day | ORAL | Status: DC
  Administered 2024-04-18: 20 mg via ORAL
  Filled 2024-04-18: qty 1

## 2024-04-18 MED ORDER — LOSARTAN POTASSIUM 100 MG PO TABS: 100.0000 mg | ORAL_TABLET | Freq: Every day | ORAL | 2 refills | Status: AC

## 2024-04-18 MED ORDER — LOSARTAN POTASSIUM 50 MG PO TABS
50.0000 mg | ORAL_TABLET | Freq: Once | ORAL | Status: AC
  Administered 2024-04-18: 50 mg via ORAL
  Filled 2024-04-18: qty 1

## 2024-04-18 MED ORDER — FLUTICASONE PROPIONATE 50 MCG/ACT NA SUSP: 1.0000 | Freq: Every day | NASAL | Status: AC | PRN

## 2024-04-18 MED ORDER — LOSARTAN POTASSIUM 50 MG PO TABS: 100.0000 mg | ORAL_TABLET | Freq: Every day | ORAL | Status: DC

## 2024-04-18 NOTE — TOC Progression Note (Signed)
 Transition of Care [facility]) - Progression Note    Patient Details  Name: Marc Gray MRN: [id number] Date of Birth: 1945/08/20  Transition of [facility]) CM/SW Contact  Racheal LITTIE Schimke, RN Phone Number: 04/18/2024, 9:47 AM  Clinical Narrative: CMRN spoke with HTA, Tammy who provided Ins. Auth. [id number], good for 7 days. Called [facility] to inform them, no answer left message to return call to discuss discharge today.        Barriers to Discharge: Continued Medical Work up               Expected Discharge Plan and Services                                               Social Drivers of Health (SDOH) Interventions SDOH Screenings   Food Insecurity: No Food Insecurity (04/12/2024)  Housing: Unknown (04/12/2024)  Transportation Needs: No Transportation Needs (04/12/2024)  Utilities: Not At Risk (04/12/2024)  Depression (PHQ2-9): Low Risk  (12/25/2023)  Recent Concern: Depression (PHQ2-9) - Medium Risk (12/04/2023)  Financial Resource Strain: Low Risk  (11/07/2023)   Received from [facility]  Social Connections: Socially Integrated (04/12/2024)  Tobacco Use: Medium Risk (04/12/2024)    Readmission Risk Interventions     No data to display

## 2024-04-18 NOTE — Plan of Care (Signed)
  Problem: Education: Goal: Knowledge of General Education information will improve Description: Including pain rating scale, medication(s)/side effects and non-pharmacologic comfort measures Outcome: Adequate for Discharge   Problem: Health Behavior/Discharge Planning: Goal: Ability to manage health-related needs will improve Outcome: Adequate for Discharge   Problem: Clinical Measurements: Goal: Ability to maintain clinical measurements within normal limits will improve Outcome: Adequate for Discharge Goal: Will remain free from infection Outcome: Adequate for Discharge Goal: Diagnostic test results will improve Outcome: Adequate for Discharge Goal: Respiratory complications will improve Outcome: Adequate for Discharge Goal: Cardiovascular complication will be avoided Outcome: Adequate for Discharge   Problem: Activity: Goal: Risk for activity intolerance will decrease Outcome: Adequate for Discharge   Problem: Nutrition: Goal: Adequate nutrition will be maintained Outcome: Adequate for Discharge   Problem: Coping: Goal: Level of anxiety will decrease Outcome: Adequate for Discharge   Problem: Elimination: Goal: Will not experience complications related to bowel motility Outcome: Adequate for Discharge Goal: Will not experience complications related to urinary retention Outcome: Adequate for Discharge   Problem: Pain Managment: Goal: General experience of comfort will improve and/or be controlled Outcome: Adequate for Discharge   Problem: Safety: Goal: Ability to remain free from injury will improve Outcome: Adequate for Discharge   Problem: Skin Integrity: Goal: Risk for impaired skin integrity will decrease Outcome: Adequate for Discharge   Problem: Education: Goal: Knowledge of disease or condition will improve Outcome: Adequate for Discharge Goal: Knowledge of secondary prevention will improve (MUST DOCUMENT ALL) Outcome: Adequate for Discharge Goal:  Knowledge of patient specific risk factors will improve (DELETE if not current risk factor) Outcome: Adequate for Discharge   Problem: Ischemic Stroke/TIA Tissue Perfusion: Goal: Complications of ischemic stroke/TIA will be minimized Outcome: Adequate for Discharge   Problem: Coping: Goal: Will verbalize positive feelings about self Outcome: Adequate for Discharge Goal: Will identify appropriate support needs Outcome: Adequate for Discharge   Problem: Health Behavior/Discharge Planning: Goal: Ability to manage health-related needs will improve Outcome: Adequate for Discharge Goal: Goals will be collaboratively established with patient/family Outcome: Adequate for Discharge   Problem: Self-Care: Goal: Ability to participate in self-care as condition permits will improve Outcome: Adequate for Discharge Goal: Verbalization of feelings and concerns over difficulty with self-care will improve Outcome: Adequate for Discharge Goal: Ability to communicate needs accurately will improve Outcome: Adequate for Discharge   Problem: Nutrition: Goal: Risk of aspiration will decrease Outcome: Adequate for Discharge Goal: Dietary intake will improve Outcome: Adequate for Discharge

## 2024-04-18 NOTE — Discharge Summary (Signed)
 Physician Discharge Summary   Marc Gray  male DOB: 12/23/1945  FMW:[id number]  PCP: Valora Lynwood FALCON, MD  Admit date: 04/12/2024 Discharge date: 04/18/2024  Admitted From: home Disposition:  home Home Health: Yes CODE STATUS: Full code   Hospital Course:  For full details, please see H&P, progress notes, consult notes and ancillary notes.  Briefly,  Marc Gray is a 78 y.o. male with hx of left lung cancer, prostate cancer, MGUS, hypothyroidism, COPD, eosinophilic asthma, adrenal insufficiency, CKD stage IIIA, CAD, PAD, CVA, iron  deficiency anemia on IV iron , who presented to the hospital with generalized weakness.    He was recently seen in the ED 11/11 for conjunctivitis.  Has been taking OTC antihistamines 4 times daily. Reports last time he felt like normal was 11/12.  He endorses intermittent weakness in his left leg and right arm, no tingling or numbness.  His weakness has been progressive to the point that he is having difficulty ambulating.    Toxic encephalopathy - Appears that patient has been suffering from polypharmacy outpatient, exacerbated by his wife who was likely over providing antihistamine therapy including providing Benadryl  unknown to us  while in the hospital - antihistamines d/c'ed, pt alert and oriented.   Weakness generalized -This is multifactorial given his chronic conditions including COPD, adrenal insufficiency, adenocarcinoma, MGUS, PAD, chronic pain.  Acutely worse than baseline at this time. --MRI neg for stroke --Initially rec for SNF rehab, however, pt improved while waiting for placement, and decided to go home instead.   Left leg weakness -Presents with a left leg weakness intermittently occurring for last 48 hours.  On chart review it appears he has had lumbar radiculopathy for at least a year, has seen neurosurgery in clinic 2024 -Brain MRI without acute intracranial abnormality.   - Lumbar MRI consistent  with degenerative changes and stenosis not drastically worse than 2024 - No acute pain at this time.   COPD with acute exacerbation (HCC) -Recently seen for rhinorrhea.  Has some cough.  Wheezing on exam - Not hypoxic, no supplemental O2 needed at this time - CXR without evidence of pneumonia.  Discontinued antibiotics - Respiratory viral panel negative.  Flu/COVID/RSV negative --finished steroid burst with solumedrol and prednisone . --cont bronchodilators   Adrenal insufficiency -At home takes 10 mg prednisone  in a.m. and 2.5 in PM. --pt follows with endocrin Dr. Damian.  Resume home prednisone  after discharge.   Fall at home, initial encounter - Also suffered a fall while admitted on day 1.  Head CT negative.   Hypothyroidism due to medication TSH elevated, but free T4 wnl --cont Synthroid  at home dose   Adenocarcinoma of left lung, stage 3 (HCC) - Continue outpatient follow-up with oncology.   MGUS (monoclonal gammopathy of unknown significance) Continue outpatient follow-up with oncology   History of prostate cancer History of TURP in 2020. --cont Flomax    Stage 3a chronic kidney disease (HCC) Renal function at baseline   Ascending aortic aneurysm Noted.   PAD (peripheral artery disease) --cont ASA and statin   Chronic neuropathy.   --cont gabapentin    Primary hypertension --home Lopressor  d/c'ed due to low HR. --resume home lasix  after discharge. --added losartan  100 mg daily.   Depression --cont trazodone    Obesity I (BMI 30-39.9) Body mass index is 32.53 kg/m.    Conjunctivitis Not likely bacterial.  Continue eyedrops   Unless noted above, medications under STOP list are ones pt was not taking PTA.  Discharge Diagnoses:  Active Problems:  Adenocarcinoma of left lung, stage 3 (HCC)   Hypothyroidism due to medication   Adrenal insufficiency   MGUS (monoclonal gammopathy of unknown significance)   History of prostate cancer   Stage 3a chronic  kidney disease (HCC)   Ascending aortic aneurysm   PAD (peripheral artery disease)   Chronic pain syndrome   Primary hypertension   Depression   Left leg weakness   Fall at home, initial encounter   COPD with acute exacerbation (HCC)   Obesity (BMI 30-39.9)   Family dynamics problem   Toxic encephalopathy   Weakness generalized   Conjunctivitis     Discharge Instructions:  Allergies as of 04/18/2024       Reactions   Morphine  And Codeine         Medication List     STOP taking these medications    levofloxacin  0.5 % ophthalmic solution Commonly known as: QUIXIN    metoprolol  tartrate 25 MG tablet Commonly known as: LOPRESSOR    traMADol  50 MG tablet Commonly known as: ULTRAM        TAKE these medications    acetaminophen  650 MG CR tablet Commonly known as: TYLENOL  Take 1,300 mg by mouth every 8 (eight) hours as needed for pain.   amphetamine -dextroamphetamine  20 MG tablet Commonly known as: ADDERALL Take by mouth.   aspirin  EC 81 MG tablet Take 1 tablet (81 mg total) by mouth daily. Swallow whole.   Azelastine  HCl 137 MCG/SPRAY Soln SMARTSIG:1-2 Spray(s) Both Nares Twice Daily   benzonatate  200 MG capsule Commonly known as: TESSALON  Take 1 capsule (200 mg total) by mouth 3 (three) times daily as needed for cough.   Breztri  Aerosphere 160-9-4.8 MCG/ACT Aero inhaler Generic drug: budesonide -glycopyrrolate -formoterol  Inhale 2 puffs into the lungs 2 (two) times daily.   calcium -vitamin D  500-200 MG-UNIT tablet Commonly known as: OSCAL WITH D Take 1 tablet by mouth daily.   Dupixent 300 MG/2ML Soaj Generic drug: Dupilumab Inject 300 mg into the skin every 14 (fourteen) days.   EPINEPHrine 0.3 mg/0.3 mL Soaj injection Commonly known as: EPI-PEN Inject 0.3 mg into the muscle as needed.   fluticasone  50 MCG/ACT nasal spray Commonly known as: FLONASE  Place 1 spray into both nostrils daily as needed for allergies or rhinitis. Home med. What  changed:  when to take this reasons to take this additional instructions   furosemide  20 MG tablet Commonly known as: LASIX  Take 20 mg by mouth daily.   gabapentin  100 MG capsule Commonly known as: Neurontin  Take 1 capsule (100 mg total) by mouth 3 (three) times daily.   hydroxypropyl methylcellulose / hypromellose 2.5 % ophthalmic solution Commonly known as: ISOPTO TEARS / GONIOVISC Place 1 drop into both eyes as needed for dry eyes.   Ipratropium-Albuterol  20-100 MCG/ACT Aers respimat Commonly known as: COMBIVENT Inhale 1 puff into the lungs every 6 (six) hours as needed.   levothyroxine  137 MCG tablet Commonly known as: SYNTHROID  Take 137 mcg by mouth daily before breakfast.   losartan  100 MG tablet Commonly known as: COZAAR  Take 1 tablet (100 mg total) by mouth daily. Start taking on: April 19, 2024   magnesium oxide 400 (240 Mg) MG tablet Commonly known as: MAG-OX Take 400 mg by mouth daily.   Melatonin 10 MG Tabs Take 10 mg by mouth at bedtime.   montelukast  10 MG tablet Commonly known as: SINGULAIR  Take 10 mg by mouth daily.   Multi-Vitamins Tabs Take 1 tablet by mouth daily.   pantoprazole  40 MG tablet Commonly known as: Protonix   Take 1 tablet (40 mg total) by mouth 2 (two) times daily. Home med. What changed:  when to take this additional instructions   predniSONE  5 MG tablet Commonly known as: DELTASONE  TAKE TWO TABLETS BY MOUTH IN THE MORNING AND TAKE ONE-HALF Tablets  BY MOUTH IN THE AFTERNOON   rosuvastatin  10 MG tablet Commonly known as: CRESTOR  Take 10 mg by mouth daily.   tadalafil  10 MG tablet Commonly known as: CIALIS  Take 1-2 tablets (10-20 mg total) by mouth daily as needed for erectile dysfunction. Take 5-10mg  by mouth as needed prior to intercourse   tamsulosin  0.4 MG Caps capsule Commonly known as: FLOMAX  Take 1 capsule (0.4 mg total) by mouth daily.   traZODone  100 MG tablet Commonly known as: DESYREL  Take 100 mg by mouth  at bedtime.         Contact information for follow-up providers     Valora Lynwood FALCON, MD Follow up.   Specialty: Family Medicine Why: hospital follow up Contact information: [address] [facility] [place] [address] [phone]              Contact information for after-discharge care     Destination     [facility] .   Service: Skilled Nursing Contact information: [address] [place]  [phone]                     Allergies  Allergen Reactions   Morphine  And Codeine      The results of significant diagnostics from this hospitalization (including imaging, microbiology, ancillary and laboratory) are listed below for reference.   Consultations:   Procedures/Studies: ECHOCARDIOGRAM COMPLETE Result Date: 04/14/2024    ECHOCARDIOGRAM REPORT   Patient Name:   Spyridon Hornstein Gray Date of Exam: 04/14/2024 Medical Rec #:  [id number]                    Height:       73.0 in Accession #:    [id number]                   Weight:       246.6 lb Date of Birth:  [birth year]                     BSA:          2.352 m Patient Age:    78 years                     BP:           104/39 mmHg Patient Gender: M                            HR:           82 bpm. Exam Location:  [facility] Procedure: 2D Echo, Cardiac Doppler, Color Doppler and Saline Contrast Bubble            Study (Both Spectral and Color Flow Doppler were utilized during            procedure). Indications:     Syncope R55  History:         Patient has no prior history of Echocardiogram examinations.                  COPD,  Signs/Symptoms:Dyspnea; Risk Factors:Diabetes.  Sonographer:     Christopher Furnace Referring Phys:  [id number] [facility] Diagnosing Phys: Cara JONETTA Lovelace MD  Sonographer Comments: Image acquisition challenging due to COPD. IMPRESSIONS  1. Left ventricular ejection  fraction, by estimation, is 65 to 70%. The left ventricle has normal function. The left ventricle has no regional wall motion abnormalities. There is mild asymmetric left ventricular hypertrophy of the basal and inferior segments. Left ventricular diastolic parameters are consistent with Grade I diastolic dysfunction (impaired relaxation).  2. Right ventricular systolic function is normal. The right ventricular size is normal.  3. The mitral valve is normal in structure. No evidence of mitral valve regurgitation.  4. The aortic valve is normal in structure. Aortic valve regurgitation is not visualized. Aortic valve sclerosis is present, with no evidence of aortic valve stenosis. FINDINGS  Left Ventricle: Left ventricular ejection fraction, by estimation, is 65 to 70%. The left ventricle has normal function. The left ventricle has no regional wall motion abnormalities. Strain was performed and the global longitudinal strain is indeterminate. The left ventricular internal cavity size was normal in size. There is mild asymmetric left ventricular hypertrophy of the basal and inferior segments. Left ventricular diastolic parameters are consistent with Grade I diastolic dysfunction  (impaired relaxation). Right Ventricle: The right ventricular size is normal. No increase in right ventricular wall thickness. Right ventricular systolic function is normal. Left Atrium: Left atrial size was normal in size. Right Atrium: Right atrial size was normal in size. Pericardium: There is no evidence of pericardial effusion. Mitral Valve: The mitral valve is normal in structure. No evidence of mitral valve regurgitation. MV peak gradient, 4.6 mmHg. The mean mitral valve gradient is 3.0 mmHg. Tricuspid Valve: The tricuspid valve is normal in structure. Tricuspid valve regurgitation is not demonstrated. Aortic Valve: The aortic valve is normal in structure. Aortic valve regurgitation is not visualized. Aortic valve sclerosis is present,  with no evidence of aortic valve stenosis. Aortic valve mean gradient measures 3.0 mmHg. Aortic valve peak gradient measures 4.9 mmHg. Aortic valve area, by VTI measures 4.23 cm. Pulmonic Valve: The pulmonic valve was normal in structure. Pulmonic valve regurgitation is not visualized. Aorta: The ascending aorta was not well visualized. IAS/Shunts: No atrial level shunt detected by color flow Doppler. Agitated saline contrast was given intravenously to evaluate for intracardiac shunting. Additional Comments: 3D was performed not requiring image post processing on an independent workstation and was indeterminate.  LEFT VENTRICLE PLAX 2D LVIDd:         4.80 cm   Diastology LVIDs:         3.00 cm   LV e' medial:    4.35 cm/s LV PW:         1.30 cm   LV E/e' medial:  16.6 LV IVS:        1.00 cm   LV e' lateral:   6.64 cm/s LVOT diam:     2.30 cm   LV E/e' lateral: 10.9 LV SV:         95 LV SV Index:   40 LVOT Area:     4.15 cm  RIGHT VENTRICLE RV Basal diam:  4.40 cm RV Mid diam:    3.60 cm LEFT ATRIUM           Index        RIGHT ATRIUM           Index LA diam:      3.00 cm 1.28 cm/m  RA Area:     29.50 cm LA Vol (A4C): 77.5 ml 32.95 ml/m  RA Volume:   117.00 ml 49.75 ml/m  AORTIC VALVE AV Area (Vmax):    4.19 cm AV Area (Vmean):   4.13 cm AV Area (VTI):     4.23 cm AV Vmax:           111.00 cm/s AV Vmean:          72.200 cm/s AV VTI:            0.225 m AV Peak Grad:      4.9 mmHg AV Mean Grad:      3.0 mmHg LVOT Vmax:         112.00 cm/s LVOT Vmean:        71.800 cm/s LVOT VTI:          0.229 m LVOT/AV VTI ratio: 1.02  AORTA Ao Root diam: 3.90 cm MITRAL VALVE               TRICUSPID VALVE MV Area (PHT): 2.99 cm    TR Peak grad:   7.3 mmHg MV Area VTI:   3.08 cm    TR Vmax:        135.00 cm/s MV Peak grad:  4.6 mmHg MV Mean grad:  3.0 mmHg    SHUNTS MV Vmax:       1.07 m/s    Systemic VTI:  0.23 m MV Vmean:      76.8 cm/s   Systemic Diam: 2.30 cm MV Decel Time: 254 msec MV E velocity: 72.30 cm/s MV A  velocity: 81.20 cm/s MV E/A ratio:  0.89 Dwayne D Callwood MD Electronically signed by Cara JONETTA Lovelace MD Signature Date/Time: 04/14/2024/1:54:53 PM    Final    MR LUMBAR SPINE WO CONTRAST Result Date: 04/13/2024 EXAM: MRI LUMBAR SPINE 04/13/2024 08:50:52 PM TECHNIQUE: Multiplanar multisequence MRI of the lumbar spine was performed without the administration of intravenous contrast. COMPARISON: MRI lumbar spine 02/27/2023. CLINICAL HISTORY: Lumbar radiculopathy, no red flags, no prior management; known stenosis per MRI 2024, acute worsening with gait instability now. HISTORY OF prostate Ca, lung Ca and MGUS FINDINGS: BONES AND ALIGNMENT: Similar approximately 2 mm of degenerative anterolisthesis of L3-L4 and 3 mm of degenerative retrolisthesis of L5 on S1. No new malalignment. Normal vertebral body heights. Mildly heterogeneous bone marrow without specific evidence of acute fracture, discitis/osteomyelitis, or suspicious bone lesion. SPINAL CORD: The conus terminates normally. SOFT TISSUES: No paraspinal mass. L1-L2: Mild disc bulge.  No spinal canal stenosis or neural foraminal narrowing. L2-L3: Mild disc bulge, ligamentum flavum thickening, and facet arthropathy. No spinal canal stenosis or neural foraminal narrowing. L3-L4: Broad disc bulging with moderate facet arthropathy and ligamentum flavum thickening. Similar mild subarticular recess stenosis mild canal stenosis. Similar mild right foraminal stenosis. Patent left foramen. L4-L5: Left eccentric disc bulging and endplate spurring. Bilateral facet arthropathy and ligamentum flavum thickening. Similar left subarticular recess stenosis which could affect the descending left L5 nerve root. Similar mild bilateral foraminal stenosis. Patent central canal. L5-S1: Endplate spurring and disc bulging. Some mild left foraminal stenosis. IMPRESSION: 1. At L4-5, similar left subarticular recess stenosis and mild bilateral foraminal stenosis. 2. Similar mild  foraminal stenosis on the left at L5-S1 and the right at L3-L4. Electronically signed by: Gilmore Molt MD 04/13/2024 09:34 PM EST RP Workstation: [id number]   CT HEAD WO CONTRAST ( ) Result Date: 04/13/2024 EXAM: CT HEAD WITHOUT CONTRAST 04/13/2024 09:00:47 AM TECHNIQUE: CT of the head  was performed without the administration of intravenous contrast. Automated exposure control, iterative reconstruction, and/or weight based adjustment of the mA/kV was utilized to reduce the radiation dose to as low as reasonably achievable. COMPARISON: 04/12/2024 CLINICAL HISTORY: Head trauma, coagulopathy (Age 19-64y); fell from hospital bed- hit his head. r/o SDH FINDINGS: BRAIN AND VENTRICLES: No acute hemorrhage. No evidence of acute infarct. No hydrocephalus. No extra-axial collection. No mass effect or midline shift. Age-related atrophy. Mild periventricular white matter disease. Mild calcific atheromatous disease. ORBITS: Status post bilateral lens replacement. SINUSES: Mild mucosal disease within right frontal sinus. SOFT TISSUES AND SKULL: No acute soft tissue abnormality. No skull fracture. IMPRESSION: 1. No acute intracranial abnormality. 2. Age-related atrophy and mild periventricular white matter disease. Electronically signed by: Evalene Coho MD 04/13/2024 09:37 AM EST RP Workstation: [id number]   MR BRAIN WO CONTRAST Result Date: 04/12/2024 EXAM: MRI BRAIN WITHOUT CONTRAST 04/12/2024 12:43:42 PM TECHNIQUE: Multiplanar multisequence MRI of the head/brain was performed without the administration of intravenous contrast. COMPARISON: MR Head without and with IV contrast 11/27/2017. CT head without contrast 04/12/2024. CLINICAL HISTORY: Neuro deficit, acute, stroke suspected. Left-sided weakness. FINDINGS: BRAIN AND VENTRICLES: No acute infarct. No intracranial hemorrhage. No mass. No midline shift. No hydrocephalus. Moderate generalized atrophy and white matter disease is similar to the CT and has  progressed since the prior MRI. The sella is unremarkable. Normal flow voids. ORBITS: Bilateral lens replacements are noted. The globes and orbits are otherwise within normal limits. SINUSES AND MASTOIDS: No acute abnormality. BONES AND SOFT TISSUES: Normal marrow signal. No acute soft tissue abnormality. IMPRESSION: 1. No acute intracranial abnormality. 2. Moderate generalized atrophy and white matter disease, progressed since prior MRI and similar to recent CT. Electronically signed by: Lonni Necessary MD 04/12/2024 12:59 PM EST RP Workstation: [id number]   DG Chest Port 1 View Result Date: 04/12/2024 EXAM: 1 VIEW(S) XRAY OF THE CHEST 04/12/2024 08:10:00 AM COMPARISON: Comparison is 07/22/2023. CLINICAL HISTORY: SOB (shortness of breath) FINDINGS: LUNGS AND PLEURA: Lung volumes are low. No focal pulmonary opacity. No pleural effusion. No pneumothorax. HEART AND MEDIASTINUM: Aortic atherosclerosis (ICD10-170.0). No acute abnormality of the cardiac and mediastinal silhouettes. BONES AND SOFT TISSUES: Thoracic vertebral augmentation changes. No acute osseous abnormality. IMPRESSION: 1. No acute cardiopulmonary abnormality. 2. Low lung volumes. Electronically signed by: Lonni Necessary MD 04/12/2024 12:11 PM EST RP Workstation: [id number]   DG Pelvis 1-2 Views Result Date: 04/12/2024 CLINICAL DATA:  Fall with right hip pain. EXAM: PELVIS - 1-2 VIEW COMPARISON:  No comparison studies available. FINDINGS: No evidence for an acute fracture. SI joints and symphysis pubis unremarkable. No evidence for hip dislocation. IMPRESSION: No acute bony findings. Electronically Signed   By: Camellia Candle M.D.   On: 04/12/2024 05:06   CT HEAD WO CONTRAST Result Date: 04/12/2024 EXAM: CT HEAD WITHOUT CONTRAST 04/12/2024 04:53:56 AM TECHNIQUE: CT of the head was performed without the administration of intravenous contrast. Automated exposure control, iterative reconstruction, and/or weight based adjustment of the  mA/kV was utilized to reduce the radiation dose to as low as reasonably achievable. COMPARISON: Comparison is made to 11/08/2020. CLINICAL HISTORY: Neuro deficit, acute, stroke suspected. FINDINGS: BRAIN AND VENTRICLES: No acute hemorrhage. No evidence of acute infarct. Parenchymal volume loss is commensurate with the patient's age. Periventricular white matter changes are present likely reflecting the sequela of small vessel ischemia. Mild ventricular megaly involving the lateral ventricles, likely the sequela of central atrophy and ex vacuo dilation. No extra-axial collection. No mass effect or midline shift. ORBITS:  No acute abnormality. SINUSES: No acute abnormality. SOFT TISSUES AND SKULL: No acute soft tissue abnormality. No skull fracture. IMPRESSION: 1. No acute intracranial abnormality. 2. Parenchymal volume loss commensurate with the patient's age. 3. Periventricular white matter changes, likely sequela of small vessel ischemia. Electronically signed by: Dorethia Molt MD 04/12/2024 05:00 AM EST RP Workstation: [id number]      Labs: BNP (last 3 results) Recent Labs    07/22/23 0934  BNP 151.4*   Basic Metabolic Panel: Recent Labs  Lab 04/12/24 0527 04/13/24 0440  NA 140 140  K 4.7 4.4  CL 109 108  CO2 24 21*  GLUCOSE 111* 89  BUN 24* 19  CREATININE 1.38* 1.29*  CALCIUM  9.3 8.9  MG  --  2.0   Liver Function Tests: Recent Labs  Lab 04/12/24 0527 04/13/24 0440  AST 22 22  ALT 20 14  ALKPHOS 45 39  BILITOT 0.3 0.4  PROT 6.1* 5.6*  ALBUMIN 3.9 3.5   No results for input(s): LIPASE, AMYLASE in the last 168 hours. No results for input(s): AMMONIA in the last 168 hours. CBC: Recent Labs  Lab 04/12/24 0527 04/13/24 0745  WBC 6.6 4.5  NEUTROABS 4.5 2.1  HGB 13.3 12.9*  HCT 41.1 38.9*  MCV 95.4 94.0  PLT 142* 119*   Cardiac Enzymes: No results for input(s): CKTOTAL, CKMB, CKMBINDEX, TROPONINI in the last 168 hours. BNP: Invalid input(s):  POCBNP CBG: Recent Labs  Lab 04/12/24 0534  GLUCAP 109*   D-Dimer No results for input(s): DDIMER in the last 72 hours. Hgb A1c No results for input(s): HGBA1C in the last 72 hours. Lipid Profile No results for input(s): CHOL, HDL, LDLCALC, TRIG, CHOLHDL, LDLDIRECT in the last 72 hours. Thyroid  function studies No results for input(s): TSH, T4TOTAL, T3FREE, THYROIDAB in the last 72 hours.  Invalid input(s): FREET3 Anemia work up No results for input(s): VITAMINB12, FOLATE, FERRITIN, TIBC, IRON , RETICCTPCT in the last 72 hours. Urinalysis    Component Value Date/Time   COLORURINE YELLOW (A) 02/07/2019 1049   APPEARANCEUR CLEAR (A) 02/07/2019 1049   APPEARANCEUR Clear 02/05/2019 1325   LABSPEC 1.014 02/07/2019 1049   LABSPEC 1.008 09/27/2011 1748   PHURINE 6.0 02/07/2019 1049   GLUCOSEU NEGATIVE 02/07/2019 1049   GLUCOSEU Negative 09/27/2011 1748   HGBUR NEGATIVE 02/07/2019 1049   BILIRUBINUR NEGATIVE 02/07/2019 1049   BILIRUBINUR Negative 02/05/2019 1325   BILIRUBINUR Negative 09/27/2011 1748   KETONESUR NEGATIVE 02/07/2019 1049   PROTEINUR NEGATIVE 02/07/2019 1049   NITRITE NEGATIVE 02/07/2019 1049   LEUKOCYTESUR NEGATIVE 02/07/2019 1049   LEUKOCYTESUR Negative 09/27/2011 1748   Sepsis Labs Recent Labs  Lab 04/12/24 0527 04/13/24 0745  WBC 6.6 4.5   Microbiology Recent Results (from the past 240 hours)  Respiratory (~20 pathogens) panel by PCR     Status: None   Collection Time: 04/12/24 12:00 PM   Specimen: Nasopharyngeal Swab; Respiratory  Result Value Ref Range Status   Adenovirus NOT DETECTED NOT DETECTED Final   Coronavirus 229E NOT DETECTED NOT DETECTED Final    Comment: (NOTE) The Coronavirus on the Respiratory Panel, DOES NOT test for the novel  Coronavirus (2019 nCoV)    Coronavirus HKU1 NOT DETECTED NOT DETECTED Final   Coronavirus NL63 NOT DETECTED NOT DETECTED Final   Coronavirus OC43 NOT DETECTED NOT  DETECTED Final   Metapneumovirus NOT DETECTED NOT DETECTED Final   Rhinovirus / Enterovirus NOT DETECTED NOT DETECTED Final   Influenza A NOT DETECTED NOT DETECTED Final   Influenza  B NOT DETECTED NOT DETECTED Final   Parainfluenza Virus 1 NOT DETECTED NOT DETECTED Final   Parainfluenza Virus 2 NOT DETECTED NOT DETECTED Final   Parainfluenza Virus 3 NOT DETECTED NOT DETECTED Final   Parainfluenza Virus 4 NOT DETECTED NOT DETECTED Final   Respiratory Syncytial Virus NOT DETECTED NOT DETECTED Final   Bordetella pertussis NOT DETECTED NOT DETECTED Final   Bordetella Parapertussis NOT DETECTED NOT DETECTED Final   Chlamydophila pneumoniae NOT DETECTED NOT DETECTED Final   Mycoplasma pneumoniae NOT DETECTED NOT DETECTED Final    Comment: Performed at [facility], [address], [place], [place] [address]  Resp panel by RT-PCR (RSV, Flu A&B, Covid) Anterior Nasal Swab     Status: None   Collection Time: 04/13/24 12:15 PM   Specimen: Anterior Nasal Swab  Result Value Ref Range Status   SARS Coronavirus 2 by RT PCR NEGATIVE NEGATIVE Final    Comment: (NOTE) SARS-CoV-2 target nucleic acids are NOT DETECTED.  The SARS-CoV-2 RNA is generally detectable in upper respiratory specimens during the acute phase of infection. The lowest concentration of SARS-CoV-2 viral copies this assay can detect is 138 copies/mL. A negative result does not preclude SARS-Cov-2 infection and should not be used as the sole basis for treatment or other patient management decisions. A negative result may occur with  improper specimen collection/handling, submission of specimen other than nasopharyngeal swab, presence of viral mutation(s) within the areas targeted by this assay, and inadequate number of viral copies(<138 copies/mL). A negative result must be combined with clinical observations, patient history, and epidemiological information. The expected result is Negative.  Fact Sheet for Patients:   bloggercourse.com  Fact Sheet for Healthcare Providers:  seriousbroker.it  This test is no t yet approved or cleared by the [place]  FDA and  has been authorized for detection and/or diagnosis of SARS-CoV-2 by FDA under an Emergency Use Authorization (EUA). This EUA will remain  in effect (meaning this test can be used) for the duration of the COVID-19 declaration under Section 564(b)(1) of the Act, 21 U.S.C.section 360bbb-3(b)(1), unless the authorization is terminated  or revoked sooner.       Influenza A by PCR NEGATIVE NEGATIVE Final   Influenza B by PCR NEGATIVE NEGATIVE Final    Comment: (NOTE) The Xpert Xpress SARS-CoV-2/FLU/RSV plus assay is intended as an aid in the diagnosis of influenza from Nasopharyngeal swab specimens and should not be used as a sole basis for treatment. Nasal washings and aspirates are unacceptable for Xpert Xpress SARS-CoV-2/FLU/RSV testing.  Fact Sheet for Patients: bloggercourse.com  Fact Sheet for Healthcare Providers: seriousbroker.it  This test is not yet approved or cleared by the [place]  FDA and has been authorized for detection and/or diagnosis of SARS-CoV-2 by FDA under an Emergency Use Authorization (EUA). This EUA will remain in effect (meaning this test can be used) for the duration of the COVID-19 declaration under Section 564(b)(1) of the Act, 21 U.S.C. section 360bbb-3(b)(1), unless the authorization is terminated or revoked.     Resp Syncytial Virus by PCR NEGATIVE NEGATIVE Final    Comment: (NOTE) Fact Sheet for Patients: bloggercourse.com  Fact Sheet for Healthcare Providers: seriousbroker.it  This test is not yet approved or cleared by the [place]  FDA and has been authorized for detection and/or diagnosis of SARS-CoV-2 by FDA under an Emergency Use  Authorization (EUA). This EUA will remain in effect (meaning this test can be used) for the duration of the COVID-19 declaration under Section 564(b)(1)  of the Act, 21 U.S.C. section 360bbb-3(b)(1), unless the authorization is terminated or revoked.  Performed at [facility], [address] [place], [place] [address]      Total time spend on discharging this patient, including the last patient exam, discussing the hospital stay, instructions for ongoing care as it relates to all pertinent caregivers, as well as preparing the medical discharge records, prescriptions, and/or referrals as applicable, is 45 minutes.    Ellouise Haber, MD  [facility] 04/18/2024, 2:29 PM

## 2024-04-18 NOTE — TOC Progression Note (Signed)
 Transition of Care [facility]) - Progression Note    Patient Details  Name: Marc Gray MRN: [id number] Date of Birth: 08-10-1945  Transition of [facility]) CM/SW Contact  Racheal LITTIE Schimke, RN Phone Number: 04/18/2024, 1:56 PM  Clinical Narrative: Firefighter. Per HTA, Tammy [phone] good for seven days upon arrival at the facility. Spoke with [facility], they are not unable to admit until Monday, 04/21/24.   Patient is declining and prefer to go home, voices satisfaction with care given, however feeling better and stronger, want to go home. Wife is on the way to transport.        Barriers to Discharge: Continued Medical Work up               Expected Discharge Plan and Services                                               Social Drivers of Health (SDOH) Interventions SDOH Screenings   Food Insecurity: No Food Insecurity (04/12/2024)  Housing: Unknown (04/12/2024)  Transportation Needs: No Transportation Needs (04/12/2024)  Utilities: Not At Risk (04/12/2024)  Depression (PHQ2-9): Low Risk  (12/25/2023)  Recent Concern: Depression (PHQ2-9) - Medium Risk (12/04/2023)  Financial Resource Strain: Low Risk  (11/07/2023)   Received from [facility]  Social Connections: Socially Integrated (04/12/2024)  Tobacco Use: Medium Risk (04/12/2024)    Readmission Risk Interventions     No data to display

## 2024-04-21 DIAGNOSIS — J8283 Eosinophilic asthma: Secondary | ICD-10-CM | POA: Diagnosis not present

## 2024-04-21 DIAGNOSIS — J4540 Moderate persistent asthma, uncomplicated: Secondary | ICD-10-CM | POA: Diagnosis not present

## 2024-04-25 DIAGNOSIS — R10A2 Flank pain, left side: Secondary | ICD-10-CM | POA: Diagnosis not present

## 2024-06-18 ENCOUNTER — Encounter: Payer: Self-pay | Admitting: Oncology

## 2024-06-25 ENCOUNTER — Ambulatory Visit: Admitting: Urology

## 2024-06-25 ENCOUNTER — Encounter: Payer: Self-pay | Admitting: Oncology

## 2024-06-25 VITALS — BP 109/64 | HR 75 | Ht 73.0 in | Wt 245.0 lb

## 2024-06-25 DIAGNOSIS — N528 Other male erectile dysfunction: Secondary | ICD-10-CM | POA: Diagnosis not present

## 2024-06-25 DIAGNOSIS — C61 Malignant neoplasm of prostate: Secondary | ICD-10-CM

## 2024-06-25 DIAGNOSIS — N401 Enlarged prostate with lower urinary tract symptoms: Secondary | ICD-10-CM | POA: Diagnosis not present

## 2024-06-25 DIAGNOSIS — R3914 Feeling of incomplete bladder emptying: Secondary | ICD-10-CM

## 2024-06-25 LAB — BLADDER SCAN AMB NON-IMAGING

## 2024-06-25 MED ORDER — TADALAFIL 10 MG PO TABS: 10.0000 mg | ORAL_TABLET | Freq: Every day | ORAL | 11 refills | Status: AC | PRN

## 2024-06-25 MED ORDER — TAMSULOSIN HCL 0.4 MG PO CAPS: 0.4000 mg | ORAL_CAPSULE | Freq: Every day | ORAL | 3 refills | Status: AC

## 2024-06-25 NOTE — Progress Notes (Signed)
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 ED on Cialis   Physical Exam: BP 109/64 (BP Location: Left Arm, Patient Position: Sitting, Cuff Size: Large)   Pulse 75   Ht 6' 1 (1.854 m)   Wt 245 lb (111.1 kg)   SpO2 94%   BMI 32.32 kg/m   Imaging/labs: PSA February 2025 up to 3.82From baseline approximately 0.75 Renal ultrasound November 2024 atrophic left kidney, no hydronephrosis  Today: Urinating well, no urinary complaints, PVR stable at Cialis  continues to work well for ED Remains on Flomax  to maximize emptying with atonic bladder  Plan:   Prostate cancer: Low-volume low risk incidentally found at time of TURP, PSA had been low <1 until this year jumped to 3.82, recommend repeating today and contact with results.  If continues to increase consider PSMA PET BPH/atonic bladder: Continues to empty well, no hydronephrosis, continue Flomax  ED: Continue Cialis , refilled PSA today, call with results, Cialis  and Flomax  refilled, RTC 1 year PVR   Marc JAYSON Burnet, MD  [facility] [address], [address] [place], [place] [address] [phone]  "

## 2024-06-26 ENCOUNTER — Ambulatory Visit: Payer: Self-pay | Admitting: Urology

## 2024-06-30 LAB — FPSA% REFLEX
% FREE PSA: 10 %
PSA, FREE: 0.4 ng/mL

## 2024-06-30 LAB — PSA TOTAL (REFLEX TO FREE): Prostate Specific Ag, Serum: 4 ng/mL (ref 0.0–4.0)

## 2024-07-02 ENCOUNTER — Ambulatory Visit: Payer: Self-pay | Admitting: Urology

## 2024-07-02 DIAGNOSIS — C61 Malignant neoplasm of prostate: Secondary | ICD-10-CM

## 2024-07-02 NOTE — Telephone Encounter (Signed)
PSA ordered. Lab appt scheduled.

## 2024-09-09 ENCOUNTER — Inpatient Hospital Stay

## 2024-09-16 ENCOUNTER — Inpatient Hospital Stay

## 2024-09-16 ENCOUNTER — Inpatient Hospital Stay: Admitting: Oncology

## 2024-12-30 ENCOUNTER — Other Ambulatory Visit

## 2025-06-25 ENCOUNTER — Ambulatory Visit: Admitting: Urology
